# Patient Record
Sex: Female | Born: 1935 | Race: White | Hispanic: No | Smoking: Former smoker
Health system: Southern US, Community
[De-identification: ages and names within clinical notes are randomized; demographics above are authoritative.]

## PROBLEM LIST (undated history)

## (undated) DIAGNOSIS — D649 Anemia, unspecified: Secondary | ICD-10-CM

## (undated) DIAGNOSIS — E039 Hypothyroidism, unspecified: Secondary | ICD-10-CM

## (undated) DIAGNOSIS — T4145XA Adverse effect of unspecified anesthetic, initial encounter: Secondary | ICD-10-CM

## (undated) DIAGNOSIS — K219 Gastro-esophageal reflux disease without esophagitis: Secondary | ICD-10-CM

## (undated) DIAGNOSIS — C3490 Malignant neoplasm of unspecified part of unspecified bronchus or lung: Secondary | ICD-10-CM

## (undated) DIAGNOSIS — I10 Essential (primary) hypertension: Secondary | ICD-10-CM

## (undated) DIAGNOSIS — C3491 Malignant neoplasm of unspecified part of right bronchus or lung: Secondary | ICD-10-CM

## (undated) DIAGNOSIS — I482 Chronic atrial fibrillation, unspecified: Secondary | ICD-10-CM

## (undated) DIAGNOSIS — M109 Gout, unspecified: Secondary | ICD-10-CM

## (undated) DIAGNOSIS — E7800 Pure hypercholesterolemia, unspecified: Secondary | ICD-10-CM

## (undated) DIAGNOSIS — J189 Pneumonia, unspecified organism: Secondary | ICD-10-CM

## (undated) DIAGNOSIS — Z9289 Personal history of other medical treatment: Secondary | ICD-10-CM

## (undated) DIAGNOSIS — C50919 Malignant neoplasm of unspecified site of unspecified female breast: Secondary | ICD-10-CM

## (undated) DIAGNOSIS — K319 Disease of stomach and duodenum, unspecified: Secondary | ICD-10-CM

## (undated) DIAGNOSIS — G459 Transient cerebral ischemic attack, unspecified: Secondary | ICD-10-CM

## (undated) DIAGNOSIS — S301XXA Contusion of abdominal wall, initial encounter: Secondary | ICD-10-CM

## (undated) DIAGNOSIS — Z9981 Dependence on supplemental oxygen: Secondary | ICD-10-CM

## (undated) DIAGNOSIS — R011 Cardiac murmur, unspecified: Secondary | ICD-10-CM

## (undated) DIAGNOSIS — I509 Heart failure, unspecified: Secondary | ICD-10-CM

## (undated) DIAGNOSIS — T8859XA Other complications of anesthesia, initial encounter: Secondary | ICD-10-CM

## (undated) DIAGNOSIS — M1990 Unspecified osteoarthritis, unspecified site: Secondary | ICD-10-CM

## (undated) DIAGNOSIS — Z853 Personal history of malignant neoplasm of breast: Secondary | ICD-10-CM

## (undated) DIAGNOSIS — I4821 Permanent atrial fibrillation: Secondary | ICD-10-CM

## (undated) DIAGNOSIS — K922 Gastrointestinal hemorrhage, unspecified: Secondary | ICD-10-CM

## (undated) HISTORY — PX: CHOLECYSTECTOMY: SHX55

## (undated) HISTORY — PX: CATARACT EXTRACTION W/ INTRAOCULAR LENS  IMPLANT, BILATERAL: SHX1307

## (undated) HISTORY — DX: Personal history of other medical treatment: Z92.89

## (undated) HISTORY — DX: Disease of stomach and duodenum, unspecified: K31.9

## (undated) HISTORY — PX: CARDIOVERSION: SHX1299

## (undated) HISTORY — PX: APPENDECTOMY: SHX54

## (undated) HISTORY — DX: Hypothyroidism, unspecified: E03.9

## (undated) HISTORY — DX: Essential (primary) hypertension: I10

## (undated) HISTORY — PX: CARDIAC CATHETERIZATION: SHX172

## (undated) HISTORY — PX: ABDOMINAL HYSTERECTOMY: SHX81

## (undated) HISTORY — PX: BREAST BIOPSY: SHX20

## (undated) HISTORY — DX: Malignant neoplasm of unspecified part of right bronchus or lung: C34.91

## (undated) HISTORY — PX: BREAST LUMPECTOMY: SHX2

## (undated) HISTORY — DX: Personal history of malignant neoplasm of breast: Z85.3

## (undated) HISTORY — DX: Permanent atrial fibrillation: I48.21

---

## 1994-04-01 HISTORY — PX: AORTIC VALVE REPLACEMENT: SHX41

## 1997-08-17 ENCOUNTER — Other Ambulatory Visit: Admission: RE | Admit: 1997-08-17 | Discharge: 1997-08-17 | Payer: Self-pay | Admitting: Oncology

## 1997-11-13 ENCOUNTER — Emergency Department (HOSPITAL_COMMUNITY): Admission: EM | Admit: 1997-11-13 | Discharge: 1997-11-13 | Payer: Self-pay | Admitting: Emergency Medicine

## 1998-04-10 ENCOUNTER — Other Ambulatory Visit: Admission: RE | Admit: 1998-04-10 | Discharge: 1998-04-10 | Payer: Self-pay | Admitting: Obstetrics and Gynecology

## 1999-03-25 ENCOUNTER — Emergency Department (HOSPITAL_COMMUNITY): Admission: EM | Admit: 1999-03-25 | Discharge: 1999-03-25 | Payer: Self-pay | Admitting: Emergency Medicine

## 1999-03-25 ENCOUNTER — Encounter: Payer: Self-pay | Admitting: Emergency Medicine

## 1999-05-03 ENCOUNTER — Other Ambulatory Visit: Admission: RE | Admit: 1999-05-03 | Discharge: 1999-05-03 | Payer: Self-pay | Admitting: Obstetrics and Gynecology

## 1999-09-03 ENCOUNTER — Ambulatory Visit (HOSPITAL_COMMUNITY): Admission: RE | Admit: 1999-09-03 | Discharge: 1999-09-03 | Payer: Self-pay | Admitting: Gastroenterology

## 2000-05-28 ENCOUNTER — Other Ambulatory Visit: Admission: RE | Admit: 2000-05-28 | Discharge: 2000-05-28 | Payer: Self-pay | Admitting: Obstetrics and Gynecology

## 2002-07-20 ENCOUNTER — Other Ambulatory Visit: Admission: RE | Admit: 2002-07-20 | Discharge: 2002-07-20 | Payer: Self-pay | Admitting: Obstetrics and Gynecology

## 2002-08-11 ENCOUNTER — Ambulatory Visit (HOSPITAL_COMMUNITY): Admission: RE | Admit: 2002-08-11 | Discharge: 2002-08-11 | Payer: Self-pay | Admitting: Cardiology

## 2002-08-23 ENCOUNTER — Ambulatory Visit (HOSPITAL_COMMUNITY): Admission: RE | Admit: 2002-08-23 | Discharge: 2002-08-23 | Payer: Self-pay | Admitting: Cardiology

## 2003-09-22 ENCOUNTER — Ambulatory Visit (HOSPITAL_COMMUNITY): Admission: RE | Admit: 2003-09-22 | Discharge: 2003-09-22 | Payer: Self-pay | Admitting: *Deleted

## 2003-09-22 ENCOUNTER — Encounter (INDEPENDENT_AMBULATORY_CARE_PROVIDER_SITE_OTHER): Payer: Self-pay | Admitting: Specialist

## 2004-02-21 ENCOUNTER — Encounter: Admission: RE | Admit: 2004-02-21 | Discharge: 2004-03-13 | Payer: Self-pay | Admitting: Cardiology

## 2006-01-23 ENCOUNTER — Other Ambulatory Visit: Admission: RE | Admit: 2006-01-23 | Discharge: 2006-01-23 | Payer: Self-pay | Admitting: Obstetrics and Gynecology

## 2006-08-05 ENCOUNTER — Encounter: Admission: RE | Admit: 2006-08-05 | Discharge: 2006-08-05 | Payer: Self-pay | Admitting: Cardiology

## 2008-02-03 ENCOUNTER — Ambulatory Visit: Payer: Self-pay | Admitting: Obstetrics and Gynecology

## 2008-02-03 ENCOUNTER — Other Ambulatory Visit: Admission: RE | Admit: 2008-02-03 | Discharge: 2008-02-03 | Payer: Self-pay | Admitting: Obstetrics and Gynecology

## 2008-02-03 ENCOUNTER — Encounter: Payer: Self-pay | Admitting: Obstetrics and Gynecology

## 2008-05-24 ENCOUNTER — Encounter: Admission: RE | Admit: 2008-05-24 | Discharge: 2008-05-24 | Payer: Self-pay | Admitting: Gastroenterology

## 2008-07-21 ENCOUNTER — Ambulatory Visit: Payer: Self-pay | Admitting: Obstetrics and Gynecology

## 2008-09-08 ENCOUNTER — Ambulatory Visit: Payer: Self-pay | Admitting: Obstetrics and Gynecology

## 2008-10-04 ENCOUNTER — Ambulatory Visit (HOSPITAL_COMMUNITY): Admission: RE | Admit: 2008-10-04 | Discharge: 2008-10-04 | Payer: Self-pay | Admitting: Obstetrics and Gynecology

## 2009-05-11 ENCOUNTER — Emergency Department (HOSPITAL_COMMUNITY): Admission: EM | Admit: 2009-05-11 | Discharge: 2009-05-12 | Payer: Self-pay | Admitting: Emergency Medicine

## 2009-11-16 ENCOUNTER — Ambulatory Visit: Payer: Self-pay | Admitting: Cardiology

## 2009-11-30 ENCOUNTER — Ambulatory Visit: Payer: Self-pay | Admitting: Cardiology

## 2009-12-18 ENCOUNTER — Ambulatory Visit: Payer: Self-pay | Admitting: Cardiovascular Disease

## 2009-12-26 ENCOUNTER — Ambulatory Visit: Payer: Self-pay | Admitting: Cardiology

## 2009-12-29 ENCOUNTER — Encounter: Admission: RE | Admit: 2009-12-29 | Discharge: 2009-12-29 | Payer: Self-pay | Admitting: Gastroenterology

## 2010-01-04 ENCOUNTER — Ambulatory Visit: Payer: Self-pay | Admitting: Cardiology

## 2010-01-18 ENCOUNTER — Ambulatory Visit: Payer: Self-pay | Admitting: Cardiology

## 2010-01-25 ENCOUNTER — Ambulatory Visit: Payer: Self-pay | Admitting: Cardiology

## 2010-01-30 ENCOUNTER — Ambulatory Visit: Payer: Self-pay | Admitting: Cardiology

## 2010-02-06 ENCOUNTER — Ambulatory Visit: Payer: Self-pay | Admitting: Cardiovascular Disease

## 2010-02-08 ENCOUNTER — Emergency Department (HOSPITAL_COMMUNITY): Admission: EM | Admit: 2010-02-08 | Discharge: 2010-02-09 | Payer: Self-pay | Admitting: Emergency Medicine

## 2010-02-10 ENCOUNTER — Emergency Department (HOSPITAL_COMMUNITY): Admission: EM | Admit: 2010-02-10 | Discharge: 2010-02-11 | Payer: Self-pay | Admitting: Emergency Medicine

## 2010-02-13 ENCOUNTER — Ambulatory Visit: Payer: Self-pay | Admitting: Cardiology

## 2010-02-15 ENCOUNTER — Ambulatory Visit (HOSPITAL_COMMUNITY): Admission: RE | Admit: 2010-02-15 | Discharge: 2010-02-15 | Payer: Self-pay | Admitting: Gastroenterology

## 2010-02-20 ENCOUNTER — Ambulatory Visit: Payer: Self-pay | Admitting: Cardiology

## 2010-02-27 ENCOUNTER — Ambulatory Visit: Payer: Self-pay | Admitting: Cardiology

## 2010-03-13 ENCOUNTER — Encounter: Admission: RE | Admit: 2010-03-13 | Payer: Self-pay | Source: Home / Self Care | Admitting: Cardiology

## 2010-03-13 ENCOUNTER — Encounter
Admission: RE | Admit: 2010-03-13 | Discharge: 2010-03-13 | Payer: Self-pay | Source: Home / Self Care | Attending: Cardiology | Admitting: Cardiology

## 2010-03-15 ENCOUNTER — Ambulatory Visit: Payer: Self-pay | Admitting: Cardiology

## 2010-03-15 ENCOUNTER — Ambulatory Visit: Payer: Self-pay | Admitting: Vascular Surgery

## 2010-03-27 ENCOUNTER — Ambulatory Visit: Payer: Self-pay | Admitting: Cardiology

## 2010-04-01 HISTORY — PX: OTHER SURGICAL HISTORY: SHX169

## 2010-04-06 ENCOUNTER — Ambulatory Visit (HOSPITAL_COMMUNITY)
Admission: RE | Admit: 2010-04-06 | Discharge: 2010-04-06 | Payer: Self-pay | Source: Home / Self Care | Attending: Vascular Surgery | Admitting: Vascular Surgery

## 2010-04-06 LAB — POCT I-STAT, CHEM 8
BUN: 28 mg/dL — ABNORMAL HIGH (ref 6–23)
Calcium, Ion: 1.1 mmol/L — ABNORMAL LOW (ref 1.12–1.32)
Chloride: 100 mEq/L (ref 96–112)
Creatinine, Ser: 0.9 mg/dL (ref 0.4–1.2)
Glucose, Bld: 105 mg/dL — ABNORMAL HIGH (ref 70–99)
HCT: 43 % (ref 36.0–46.0)
Hemoglobin: 14.6 g/dL (ref 12.0–15.0)
Potassium: 4 mEq/L (ref 3.5–5.1)
Sodium: 137 mEq/L (ref 135–145)
TCO2: 32 mmol/L (ref 0–100)

## 2010-04-06 LAB — PROTIME-INR
INR: 1.16 (ref 0.00–1.49)
Prothrombin Time: 15 seconds (ref 11.6–15.2)

## 2010-04-06 LAB — APTT: aPTT: 49 seconds — ABNORMAL HIGH (ref 24–37)

## 2010-04-11 ENCOUNTER — Ambulatory Visit: Payer: Self-pay | Admitting: Cardiology

## 2010-04-18 ENCOUNTER — Ambulatory Visit: Payer: Self-pay | Admitting: Cardiology

## 2010-04-20 ENCOUNTER — Encounter: Payer: Self-pay | Admitting: Cardiology

## 2010-04-20 DIAGNOSIS — I119 Hypertensive heart disease without heart failure: Secondary | ICD-10-CM | POA: Insufficient documentation

## 2010-04-20 DIAGNOSIS — E039 Hypothyroidism, unspecified: Secondary | ICD-10-CM | POA: Insufficient documentation

## 2010-04-20 DIAGNOSIS — I499 Cardiac arrhythmia, unspecified: Secondary | ICD-10-CM | POA: Insufficient documentation

## 2010-04-25 ENCOUNTER — Ambulatory Visit: Payer: Self-pay | Admitting: Cardiology

## 2010-05-01 ENCOUNTER — Ambulatory Visit: Payer: Self-pay | Admitting: Cardiology

## 2010-05-15 ENCOUNTER — Encounter (INDEPENDENT_AMBULATORY_CARE_PROVIDER_SITE_OTHER): Payer: Medicare Other

## 2010-05-15 DIAGNOSIS — I4891 Unspecified atrial fibrillation: Secondary | ICD-10-CM

## 2010-05-15 DIAGNOSIS — Z7901 Long term (current) use of anticoagulants: Secondary | ICD-10-CM

## 2010-05-29 ENCOUNTER — Encounter (INDEPENDENT_AMBULATORY_CARE_PROVIDER_SITE_OTHER): Payer: Medicare Other

## 2010-05-29 DIAGNOSIS — Z7901 Long term (current) use of anticoagulants: Secondary | ICD-10-CM

## 2010-05-29 DIAGNOSIS — I4891 Unspecified atrial fibrillation: Secondary | ICD-10-CM

## 2010-06-04 ENCOUNTER — Inpatient Hospital Stay (HOSPITAL_COMMUNITY)
Admission: AD | Admit: 2010-06-04 | Discharge: 2010-06-08 | DRG: 305 | Disposition: A | Discharge status: Home / Self Care | Payer: Medicare Other | Source: Ambulatory Visit | Attending: Cardiology | Admitting: Cardiology

## 2010-06-04 ENCOUNTER — Inpatient Hospital Stay (HOSPITAL_COMMUNITY): Payer: Medicare Other

## 2010-06-04 DIAGNOSIS — Z87891 Personal history of nicotine dependence: Secondary | ICD-10-CM

## 2010-06-04 DIAGNOSIS — R0789 Other chest pain: Secondary | ICD-10-CM | POA: Diagnosis present

## 2010-06-04 DIAGNOSIS — E871 Hypo-osmolality and hyponatremia: Secondary | ICD-10-CM | POA: Diagnosis present

## 2010-06-04 DIAGNOSIS — I10 Essential (primary) hypertension: Secondary | ICD-10-CM

## 2010-06-04 DIAGNOSIS — Z954 Presence of other heart-valve replacement: Secondary | ICD-10-CM

## 2010-06-04 DIAGNOSIS — I472 Ventricular tachycardia, unspecified: Secondary | ICD-10-CM | POA: Diagnosis not present

## 2010-06-04 DIAGNOSIS — I4729 Other ventricular tachycardia: Secondary | ICD-10-CM | POA: Diagnosis not present

## 2010-06-04 DIAGNOSIS — I4891 Unspecified atrial fibrillation: Secondary | ICD-10-CM | POA: Diagnosis present

## 2010-06-04 DIAGNOSIS — R42 Dizziness and giddiness: Secondary | ICD-10-CM

## 2010-06-04 DIAGNOSIS — Z79899 Other long term (current) drug therapy: Secondary | ICD-10-CM

## 2010-06-04 DIAGNOSIS — E039 Hypothyroidism, unspecified: Secondary | ICD-10-CM | POA: Diagnosis present

## 2010-06-04 DIAGNOSIS — Z7901 Long term (current) use of anticoagulants: Secondary | ICD-10-CM

## 2010-06-04 DIAGNOSIS — M109 Gout, unspecified: Secondary | ICD-10-CM | POA: Diagnosis present

## 2010-06-04 LAB — NA AND K (SODIUM & POTASSIUM), RAND UR
Potassium Urine: 24 mEq/L
Sodium, Ur: 40 mEq/L

## 2010-06-04 LAB — T4, FREE: Free T4: 1.71 ng/dL (ref 0.80–1.80)

## 2010-06-04 LAB — URINALYSIS, ROUTINE W REFLEX MICROSCOPIC
Glucose, UA: NEGATIVE mg/dL
Ketones, ur: NEGATIVE mg/dL
Nitrite: NEGATIVE
Specific Gravity, Urine: 1.009 (ref 1.005–1.030)
pH: 6.5 (ref 5.0–8.0)

## 2010-06-04 LAB — BRAIN NATRIURETIC PEPTIDE: Pro B Natriuretic peptide (BNP): 188 pg/mL — ABNORMAL HIGH (ref 0.0–100.0)

## 2010-06-04 LAB — CBC
Hemoglobin: 14.8 g/dL (ref 12.0–15.0)
MCH: 31.5 pg (ref 26.0–34.0)
MCHC: 33.9 g/dL (ref 30.0–36.0)
MCV: 93 fL (ref 78.0–100.0)
Platelets: 198 10*3/uL (ref 150–400)
RBC: 4.7 MIL/uL (ref 3.87–5.11)

## 2010-06-04 LAB — COMPREHENSIVE METABOLIC PANEL
Albumin: 3.9 g/dL (ref 3.5–5.2)
Alkaline Phosphatase: 70 U/L (ref 39–117)
BUN: 20 mg/dL (ref 6–23)
Chloride: 91 mEq/L — ABNORMAL LOW (ref 96–112)
Creatinine, Ser: 0.94 mg/dL (ref 0.4–1.2)
GFR calc non Af Amer: 57 mL/min — ABNORMAL LOW (ref >60)
Glucose, Bld: 116 mg/dL — ABNORMAL HIGH (ref 70–99)
Potassium: 4.5 mEq/L (ref 3.5–5.1)
Total Bilirubin: 0.6 mg/dL (ref 0.3–1.2)

## 2010-06-04 LAB — TSH: TSH: 1.516 u[IU]/mL (ref 0.350–4.500)

## 2010-06-04 LAB — CARDIAC PANEL(CRET KIN+CKTOT+MB+TROPI)
Relative Index: INVALID (ref 0.0–2.5)
Total CK: 58 U/L (ref 7–177)
Troponin I: 0.03 ng/mL (ref 0.00–0.06)

## 2010-06-04 LAB — APTT: aPTT: 57 seconds — ABNORMAL HIGH (ref 24–37)

## 2010-06-05 DIAGNOSIS — I119 Hypertensive heart disease without heart failure: Secondary | ICD-10-CM

## 2010-06-05 LAB — BASIC METABOLIC PANEL
CO2: 31 mEq/L (ref 19–32)
Chloride: 93 mEq/L — ABNORMAL LOW (ref 96–112)
Creatinine, Ser: 0.71 mg/dL (ref 0.4–1.2)
GFR calc Af Amer: >60 mL/min (ref >60)
Glucose, Bld: 111 mg/dL — ABNORMAL HIGH (ref 70–99)

## 2010-06-05 LAB — URINE CULTURE: Culture  Setup Time: 201203051713

## 2010-06-05 LAB — LIPID PANEL
LDL Cholesterol: 103 mg/dL — ABNORMAL HIGH (ref 0–99)
Total CHOL/HDL Ratio: 3.7 RATIO
VLDL: 39 mg/dL (ref 0–40)

## 2010-06-06 DIAGNOSIS — R072 Precordial pain: Secondary | ICD-10-CM

## 2010-06-06 LAB — BRAIN NATRIURETIC PEPTIDE: Pro B Natriuretic peptide (BNP): 198 pg/mL — ABNORMAL HIGH (ref 0.0–100.0)

## 2010-06-06 LAB — BASIC METABOLIC PANEL
CO2: 30 mEq/L (ref 19–32)
Chloride: 97 mEq/L (ref 96–112)
Creatinine, Ser: 0.69 mg/dL (ref 0.4–1.2)
GFR calc Af Amer: >60 mL/min (ref >60)
Glucose, Bld: 104 mg/dL — ABNORMAL HIGH (ref 70–99)
Sodium: 134 mEq/L — ABNORMAL LOW (ref 135–145)

## 2010-06-06 LAB — PROTIME-INR: INR: 2.34 — ABNORMAL HIGH (ref 0.00–1.49)

## 2010-06-07 ENCOUNTER — Inpatient Hospital Stay (HOSPITAL_COMMUNITY): Payer: Medicare Other

## 2010-06-07 DIAGNOSIS — R079 Chest pain, unspecified: Secondary | ICD-10-CM

## 2010-06-07 LAB — BASIC METABOLIC PANEL
Calcium: 9.8 mg/dL (ref 8.4–10.5)
Creatinine, Ser: 0.73 mg/dL (ref 0.4–1.2)
GFR calc Af Amer: >60 mL/min (ref >60)
Sodium: 135 mEq/L (ref 135–145)

## 2010-06-07 LAB — PROTIME-INR: INR: 3.04 — ABNORMAL HIGH (ref 0.00–1.49)

## 2010-06-07 MED ORDER — TECHNETIUM TC 99M TETROFOSMIN IV KIT
30.0000 | PACK | Freq: Once | INTRAVENOUS | Status: AC | PRN
  Administered 2010-06-07: 30 via INTRAVENOUS

## 2010-06-07 MED ORDER — TECHNETIUM TC 99M TETROFOSMIN IV KIT
10.0000 | PACK | Freq: Once | INTRAVENOUS | Status: AC | PRN
  Administered 2010-06-07: 10 via INTRAVENOUS

## 2010-06-08 ENCOUNTER — Inpatient Hospital Stay (HOSPITAL_COMMUNITY): Payer: Medicare Other

## 2010-06-08 LAB — BASIC METABOLIC PANEL
BUN: 21 mg/dL (ref 6–23)
Calcium: 9.6 mg/dL (ref 8.4–10.5)
Creatinine, Ser: 0.74 mg/dL (ref 0.4–1.2)
GFR calc non Af Amer: >60 mL/min (ref >60)

## 2010-06-08 LAB — PROTIME-INR
INR: 3.05 — ABNORMAL HIGH (ref 0.00–1.49)
Prothrombin Time: 31.6 seconds — ABNORMAL HIGH (ref 11.6–15.2)

## 2010-06-08 NOTE — H&P (Signed)
NAMEJAVONA, Deborah [name]               ACCOUNT NO.:  [id number]  MEDICAL RECORD NO.:  [id number]           PATIENT TYPE:  I  LOCATION:  3701                         FACILITY:  [facility]  PHYSICIAN:  Cassell Clement, M.D. DATE OF BIRTH:  [birth year]  DATE OF ADMISSION:  06/04/2010 DATE OF DISCHARGE:                             HISTORY & PHYSICAL   CARDIOLOGIST AND PRIMARY CARE PROVIDER:  Cassell Clement, MD  GASTROENTEROLOGIST:  Petra Kuba, MD  CHIEF COMPLAINT:  Uncontrolled hypertension, dizziness, chest pressure.  HISTORY:  This is an 77 year old Caucasian female with chronic atrial fibrillation who is status post aortic valve replacement with a St. Jude valve.  Her aortic valve replacement was in 1996.  She is in chronic atrial fibrillation on Coumadin.  Recently, she has been having a problem with severe fluctuations of high blood pressure.  She has been seen numerous times in the office over the past several months with adjustment of medication without much improvement.  Over the weekend, she continued to have spells of very high blood pressure associated with pulses in the 40s associated with weakness and chest pressure and dizziness.  She has not had any nausea and vomiting.  She has not been doing any recent exercise because she has felt so bad.  She has had unexplained abdominal cramping which feel to her like labor pains.  It is associated with a feeling of chills but no definite fever.  She has had a thorough workup by Dr. Ewing Schlein without any specific findings.  Of note is the fact that, she had a renal arteriogram on April 06, 2010 by Dr. Darrick Penna after a renal Doppler had suggested possible renal artery stenosis as a cause of her high blood pressure.  However, the operative findings at the time of the renal arteriogram showed widely patent left and right renal arteries as well as widely patent superior mesenteric, inferior mesenteric, and celiac arteries.  Thus, there is no  good explanation for her abdominal cramping on an ischemic basis.  The patient has been on a multidrug regimen at home which will be enumerated subsequently.  ALLERGIES:  The patient is allergic to FENTANYL, VERSED, and LIDOCAINE as well as allergic to MECLIZINE, MACRODANTIN, FOSAMAX, AMIODARONE, and CIPRO.  FAMILY HISTORY:  Her father died at age 31 of atherosclerosis.  Her mother died at 98 of Alzheimer's.  SOCIAL HISTORY:  She is a widow.  Her husband died of a cerebral hemorrhage several years ago and had been on Coumadin.  The patient is retired.  She lives at [place].  She quit smoking 35 years ago.  She has had no alcohol for the past 2 weeks because she has felt so bad. Prior to that, she was in the custom of having a glass of bourbon in the evening and often one glass of wine at dinner.  PAST MEDICAL HISTORY:  Hysterectomy, cholecystectomy, breast cancer in 1994, and aortic valve replacement for aortic stenosis in 1996. Additional history includes history of gout, history of osteoarthritis, and history of elevated cholesterol.  MEDICATIONS ON ADMISSION: 1. Ambien CR 12.5 mg at bedtime. 2. Artificial tears daily  p.r.n. 3. Tylenol Extra Strength 1-2 tablets every 4 hours p.r.n. 4. Dicyclomine 10 mg twice daily p.r.n. 5. Simvastatin 5 mg each night. 6. Tribenzor 40/5/25 one each morning. 7. Metoprolol succinate 100 mg twice daily. 8. Synthroid 88 mcg daily. 9. Prevacid 30 mg daily. 10.Potassium 20 mEq daily. 11.Os-Cal with vitamin D 500 mg twice daily. 12.Furosemide 40 mg daily. 13.Lanoxin 0.25 mg 1/2 tablet daily. 14.Fish oil 1000 mg twice a day. 15.Coumadin 5 mg 3 days a week and 2.5 mg 4 days a week. 16.Centrum Silver for women once a day. 17.Celebrex 200 mg daily. 18.Allopurinol 300 mg daily.  REVIEW OF SYSTEMS:  RESPIRATORY:  No cough or sputum production. GENITOURINARY:  No dysuria.  GI:  Cramping as noted.  She does have arthritis of her hands and hips  which have been improved with Celebrex. Bowel movements have been loose but not diarrhea.  She has had no nausea or vomiting.  She states, however, that her stomach never feels well.  All other systems are negative in detail.  PHYSICAL EXAMINATION:  VITAL SIGNS:  Blood pressure on arrival here is slightly elevated at 156/80, pulse is 64 and irregularly irregular, respirations are normal.  She is afebrile. GENERAL APPEARANCE:  A well-developed, well-nourished woman in no distress. SKIN:  Clear. HEENT:  Pupils equal and reactive.  Sclerae nonicteric.  Mouth and pharynx normal. NECK:  Carotids normal upstroke.  No bruits.  Jugular venous pressure normal.  Thyroid normal. CHEST:  Clear to auscultation. BREASTS:  Not examined. HEART:  Good opening and closing aortic valve clicks.  No gallop or rub. No aortic insufficiency. ABDOMEN:  Soft and nontender. EXTREMITIES:  Good peripheral pulses.  No edema.  No phlebitis. NEUROLOGIC:  Physiologic.  LABORATORY DATA:  Blood work is pending.  EKG and chest x-ray are pending.  IMPRESSION: 1. Uncontrolled hypertension with previous negative workup including     negative metanephrines, negative aldosterone level, and normal     renal arteriogram 2. Status post aortic valve replacement for aortic stenosis with a St.     Jude Mechanical valve. 3. Chronic atrial fibrillation. 4. History of gout with no recent exacerbation. 5. Dizzy spells, rule out arrhythmia, rule out secondary to blood     pressure fluctuation 6. Osteoarthritis. 7. Hypothyroidism. 8. Hyponatremia with serum sodium of 128 noted on recent outpatient     labs. 9. Chest pressure, possibly secondary to blood pressure fluctuation     versus anxiety, gouty angina pectoris but we will get one set of     cardiac enzymes.  DISPOSITION:  Admit to telemetry.  Check cardiac enzymes.  Change Tribenzor to 40/10/12.5.  Discontinue clonidine.  Further workup as directed by initial  labs.          ______________________________ Cassell Clement, M.D.     TB/MEDQ  D:  06/04/2010  T:  06/05/2010  Job:  [id number]  Electronically Signed by Cassell Clement M.D. on 06/07/2010 04:19:33 PM

## 2010-06-12 ENCOUNTER — Ambulatory Visit (INDEPENDENT_AMBULATORY_CARE_PROVIDER_SITE_OTHER): Payer: Medicare Other | Admitting: Nurse Practitioner

## 2010-06-12 DIAGNOSIS — E871 Hypo-osmolality and hyponatremia: Secondary | ICD-10-CM

## 2010-06-12 DIAGNOSIS — Z7901 Long term (current) use of anticoagulants: Secondary | ICD-10-CM

## 2010-06-12 DIAGNOSIS — I10 Essential (primary) hypertension: Secondary | ICD-10-CM

## 2010-06-12 DIAGNOSIS — R079 Chest pain, unspecified: Secondary | ICD-10-CM

## 2010-06-12 LAB — TYPE AND SCREEN: Antibody Screen: NEGATIVE

## 2010-06-13 LAB — URINALYSIS, ROUTINE W REFLEX MICROSCOPIC
Bilirubin Urine: NEGATIVE
Ketones, ur: NEGATIVE mg/dL
Nitrite: NEGATIVE
Protein, ur: NEGATIVE mg/dL
Specific Gravity, Urine: 1.005 (ref 1.005–1.030)
Urobilinogen, UA: 0.2 mg/dL (ref 0.0–1.0)

## 2010-06-13 LAB — PROTIME-INR
INR: 1.6 — ABNORMAL HIGH (ref 0.00–1.49)
Prothrombin Time: 19.2 seconds — ABNORMAL HIGH (ref 11.6–15.2)

## 2010-06-13 LAB — POCT CARDIAC MARKERS
CKMB, poc: <1 ng/mL — ABNORMAL LOW (ref 1.0–8.0)
Myoglobin, poc: 35 ng/mL (ref 12–200)
Myoglobin, poc: 40.6 ng/mL (ref 12–200)
Troponin i, poc: <0.05 ng/mL (ref 0.00–0.09)
Troponin i, poc: <0.05 ng/mL (ref 0.00–0.09)

## 2010-06-13 LAB — CBC
Hemoglobin: 12.6 g/dL (ref 12.0–15.0)
MCHC: 31.7 g/dL (ref 30.0–36.0)
Platelets: 178 10*3/uL (ref 150–400)
Platelets: 215 10*3/uL (ref 150–400)
RBC: 4.36 MIL/uL (ref 3.87–5.11)
RBC: 4.87 MIL/uL (ref 3.87–5.11)
RDW: 15.1 % (ref 11.5–15.5)
WBC: 7 10*3/uL (ref 4.0–10.5)

## 2010-06-13 LAB — BASIC METABOLIC PANEL
Calcium: 9.5 mg/dL (ref 8.4–10.5)
GFR calc Af Amer: >60 mL/min (ref >60)
GFR calc non Af Amer: >60 mL/min (ref >60)
Sodium: 132 mEq/L — ABNORMAL LOW (ref 135–145)

## 2010-06-13 LAB — COMPREHENSIVE METABOLIC PANEL
AST: 32 U/L (ref 0–37)
Albumin: 4.1 g/dL (ref 3.5–5.2)
Alkaline Phosphatase: 84 U/L (ref 39–117)
Chloride: 92 mEq/L — ABNORMAL LOW (ref 96–112)
GFR calc Af Amer: >60 mL/min (ref >60)
Potassium: 3.4 mEq/L — ABNORMAL LOW (ref 3.5–5.1)
Sodium: 132 mEq/L — ABNORMAL LOW (ref 135–145)
Total Bilirubin: 0.5 mg/dL (ref 0.3–1.2)
Total Protein: 7.1 g/dL (ref 6.0–8.3)

## 2010-06-13 LAB — DIFFERENTIAL
Basophils Absolute: 0 10*3/uL (ref 0.0–0.1)
Basophils Relative: 0 % (ref 0–1)
Basophils Relative: 0 % (ref 0–1)
Eosinophils Relative: 2 % (ref 0–5)
Lymphs Abs: 1 10*3/uL (ref 0.7–4.0)
Monocytes Absolute: 0.5 10*3/uL (ref 0.1–1.0)
Monocytes Relative: 7 % (ref 3–12)
Monocytes Relative: 7 % (ref 3–12)
Neutro Abs: 5.1 10*3/uL (ref 1.7–7.7)
Neutro Abs: 5.9 10*3/uL (ref 1.7–7.7)
Neutrophils Relative %: 79 % — ABNORMAL HIGH (ref 43–77)

## 2010-06-13 LAB — URINE MICROSCOPIC-ADD ON

## 2010-06-13 LAB — APTT: aPTT: 70 seconds — ABNORMAL HIGH (ref 24–37)

## 2010-06-19 ENCOUNTER — Ambulatory Visit: Payer: Self-pay | Admitting: Cardiology

## 2010-06-20 LAB — BASIC METABOLIC PANEL
CO2: 27 mEq/L (ref 19–32)
Calcium: 9.6 mg/dL (ref 8.4–10.5)
Chloride: 96 mEq/L (ref 96–112)
Creatinine, Ser: 0.63 mg/dL (ref 0.4–1.2)
GFR calc Af Amer: >60 mL/min (ref >60)
Sodium: 133 mEq/L — ABNORMAL LOW (ref 135–145)

## 2010-06-20 LAB — CBC
Hemoglobin: 14.3 g/dL (ref 12.0–15.0)
RBC: 4.77 MIL/uL (ref 3.87–5.11)
WBC: 6.5 10*3/uL (ref 4.0–10.5)

## 2010-06-20 LAB — DIFFERENTIAL
Lymphs Abs: 1.5 10*3/uL (ref 0.7–4.0)
Monocytes Absolute: 0.5 10*3/uL (ref 0.1–1.0)
Monocytes Relative: 7 % (ref 3–12)
Neutro Abs: 4.5 10*3/uL (ref 1.7–7.7)
Neutrophils Relative %: 69 % (ref 43–77)

## 2010-06-20 LAB — POCT CARDIAC MARKERS
CKMB, poc: 1 ng/mL (ref 1.0–8.0)
CKMB, poc: 2 ng/mL (ref 1.0–8.0)
Myoglobin, poc: 378 ng/mL (ref 12–200)
Myoglobin, poc: 49.4 ng/mL (ref 12–200)
Troponin i, poc: <0.05 ng/mL (ref 0.00–0.09)
Troponin i, poc: <0.05 ng/mL (ref 0.00–0.09)

## 2010-06-20 LAB — PROTIME-INR: INR: 1.84 — ABNORMAL HIGH (ref 0.00–1.49)

## 2010-06-25 NOTE — Discharge Summary (Signed)
NAMEANDREW, SORIA               ACCOUNT NO.:  [id number]  MEDICAL RECORD NO.:  [id number]           PATIENT TYPE:  I  LOCATION:  3701                         FACILITY:  [facility]  PHYSICIAN:  Cassell Clement, M.D. DATE OF BIRTH:  Dec 03, 1926  DATE OF ADMISSION:  06/04/2010 DATE OF DISCHARGE:  06/08/2010                              DISCHARGE SUMMARY   PROCEDURES: 1. Lexiscan Myoview. 2. Two-view chest x-ray. 3. 2-D echocardiogram.  PRIMARY FINAL DISCHARGE DIAGNOSIS:  Chest pain, cardiac enzymes negative for myocardial infarction and Myoview showing no ischemia, ejection fraction 68%.  SECONDARY DIAGNOSES: 1. Status post St. Jude aortic valve replacement in 1996 with     echocardiogram this admission showing an ejection fraction of 60%     and trivial AI with a mean gradient of 26 mmHg by echo. 2. Uncontrolled hypertension, medication changes made this admission. 3. Permanent atrial fibrillation. 4. Chronic anticoagulation with Coumadin. 5. Gout. 6. Dizziness. 7. Hypothyroidism with a TSH of 1.516 and free T4 of 1.71 this     admission. 8. Hyponatremia by history with a sodium of 135 at discharge. 9. Family history of atherosclerosis in her father. 10. History of breast cancer with surgery. 11. Status post hysterectomy and cholecystectomy. 12. Hyperlipidemia.  ALLERGIES:  Intolerance to FENTANYL, VERSED, LIDOCAINE, MECLIZINE, MACRODANTIN, FOSAMAX, AMIODARONE, and CIPRO.  TIME AT DISCHARGE:  38 minutes.  HOSPITAL COURSE:  Deborah [name] is an 77 year old female with a history of aortic valve replacement, but not coronary artery disease.  She came to the hospital with dizziness and chest pressure.  Her initial blood pressure was elevated above normal for her at 202/89.  She was admitted for further evaluation and treatment.  A BNP was mildly elevated at 198, but her chest x-ray only showed cardiomegaly with mild vascular prominence.  Her cardiac enzymes were negative for  MI.  Her lipid profile showed total cholesterol 194, triglycerides 194, HDL 52, LDL 103.  Her INR was therapeutic and Coumadin was continued such that it remained so with an INR of 3.05 at discharge.  She was not anemic and had no signs or symptoms of infection with a negative urinalysis and a normal white count.  A urine culture was performed and was normal.  When her cardiac enzymes were negative, a Myoview was performed on June 07, 2010, that showed no ischemia.  There was septal hypokinesis also seen in the echo, felt likely due to previous cardiac valve replacement.  Her EF was 68% and normal on echo as well.  Her medications were adjusted and her blood pressure was better controlled.  On June 08, 2010, Ms. Deborah [name] was noted to have had a run of VT during sleep, but she was asymptomatic and her EF was normal.  Her Lopressor was increased, but no further evaluation is indicated at this time.  She had some diarrhea, but this will be treated with p.r.n. medications. Ms. Deborah [name] was considered stable for discharge on June 08, 2010, to be followed closely as an outpatient.  DISCHARGE INSTRUCTIONS:  Her activity level is to be increased gradually.  She is encouraged to stick to a low-sodium  heart-healthy diet.  She is to follow up with Dr. Norma Fredrickson for Dr. Patty Sermons and get a pro-time on March 13 at 8:30.  She is to follow up with Dr. Ewing Schlein as needed.  DISCHARGE MEDICATIONS: 1. Extra Strength Tylenol 500 mg 1-2 tablets q.4 h. p.r.n. 2. Allopurinol 300 mg 1 tablet daily. 3. Fish oil OTC 1 tablet b.i.d. 4. Dicyclomine 10 mg b.i.d. p.r.n. 5. Ambien 12.5 mg nightly p.r.n. 6. Toprol-XL 100 mg b.i.d., is discontinued. 7. Lopressor 25 mg p.o. t.i.d. 8. Lanoxin 0.25 mg one half tablet daily. 9. Artificial tears daily p.r.n. 10.Zocor 5 mg a day. 11.Lasix. 12.Multivitamin daily. 13.Celebrex 200 mg daily. 14.Prevacid 30 mg a day. 15.Synthroid 88 mcg daily. 16.Potassium chloride 20  mEq daily. 17.Lasix 40 mg a day. 18.Tribenzor 40/5/25, is discontinued. 19.Tribenzor 40/10/25 mg daily. 20.Coumadin 5 mg, 1 tablet on Tuesday, Thursday, Saturday and Sunday,     other days one half tablet. 21.Lomotil tablets, 1 tablet p.o. q.4 h. p.r.n. 22.Loratadine 10 mg daily p.r.n.     Theodore Demark, PA-C   ______________________________ Cassell Clement, M.D.    RB/MEDQ  D:  06/08/2010  T:  06/09/2010  Job:  [id number]  cc:   Petra Kuba, M.D.  Electronically Signed by Theodore Demark PA-C on 06/15/2010 04:00:42 PM Electronically Signed by Cassell Clement M.D. on 06/25/2010 12:29:34 PM

## 2010-06-26 ENCOUNTER — Ambulatory Visit (INDEPENDENT_AMBULATORY_CARE_PROVIDER_SITE_OTHER): Payer: Medicare Other | Admitting: *Deleted

## 2010-06-26 DIAGNOSIS — I359 Nonrheumatic aortic valve disorder, unspecified: Secondary | ICD-10-CM | POA: Insufficient documentation

## 2010-06-26 DIAGNOSIS — I4891 Unspecified atrial fibrillation: Secondary | ICD-10-CM

## 2010-06-26 DIAGNOSIS — Z7901 Long term (current) use of anticoagulants: Secondary | ICD-10-CM

## 2010-06-29 ENCOUNTER — Telehealth: Payer: Self-pay | Admitting: *Deleted

## 2010-06-29 DIAGNOSIS — I10 Essential (primary) hypertension: Secondary | ICD-10-CM

## 2010-06-29 MED ORDER — OLMESARTAN-AMLODIPINE-HCTZ 40-5-12.5 MG PO TABS: ORAL_TABLET | ORAL | Status: DC

## 2010-06-29 NOTE — Telephone Encounter (Signed)
Agree 

## 2010-06-29 NOTE — Telephone Encounter (Signed)
Patient phoned c/o edema in legs and feet and thinks coming from the norvasc portion of tribenzor.  Current dose is tribenzor 40/10/12.5 mg daily.  Has had problems with edema on norvasc 10 mg before.   Will decrease dose to tribenzor 40/5/12.5 mg daily.  Advised to let us know if blood pressure goes up or edema doesn't go down

## 2010-07-03 ENCOUNTER — Ambulatory Visit (INDEPENDENT_AMBULATORY_CARE_PROVIDER_SITE_OTHER): Payer: Medicare Other | Admitting: *Deleted

## 2010-07-03 DIAGNOSIS — I4891 Unspecified atrial fibrillation: Secondary | ICD-10-CM

## 2010-07-03 DIAGNOSIS — Z7901 Long term (current) use of anticoagulants: Secondary | ICD-10-CM

## 2010-07-10 ENCOUNTER — Encounter: Payer: Self-pay | Admitting: Cardiology

## 2010-07-10 ENCOUNTER — Ambulatory Visit (INDEPENDENT_AMBULATORY_CARE_PROVIDER_SITE_OTHER): Payer: Medicare Other | Admitting: Cardiology

## 2010-07-10 ENCOUNTER — Other Ambulatory Visit: Payer: Self-pay | Admitting: Cardiology

## 2010-07-10 DIAGNOSIS — Z954 Presence of other heart-valve replacement: Secondary | ICD-10-CM

## 2010-07-10 DIAGNOSIS — I119 Hypertensive heart disease without heart failure: Secondary | ICD-10-CM

## 2010-07-10 DIAGNOSIS — I4891 Unspecified atrial fibrillation: Secondary | ICD-10-CM

## 2010-07-10 DIAGNOSIS — I359 Nonrheumatic aortic valve disorder, unspecified: Secondary | ICD-10-CM

## 2010-07-10 NOTE — Progress Notes (Signed)
History of Present Illness: This pleasant 77 year old woman is seen for a scheduled followup office visit.  She has a complex past medical history.  She had a St. Jude aortic valve replacement in 1996 for severe aortic valve disease.  She has been in chronic nature fibrillation and has been on long-term Coumadin.  [name] had a history of hypertension.  [name] also had a previous history of breast cancer of the right breast.  She does not have renal artery stenosis per arteriograms which were done in January 2012.  She had a negative Cardiolite stress test and an unremarkable echocardiogram in March 2012.  Current Outpatient Prescriptions  Medication Sig Dispense Refill  . allopurinol (ZYLOPRIM) 300 MG tablet Take 300 mg by mouth daily.        . calcium-vitamin D 500 MG tablet Take 1 tablet by mouth 2 (two) times daily.        . celecoxib (CELEBREX) 200 MG capsule Take 200 mg by mouth daily.        Marland Kitchen dicyclomine (BENTYL) 10 MG capsule Take 10 mg by mouth as needed.        . digoxin (LANOXIN) 0.125 MG tablet Take 125 mcg by mouth daily.        . fexofenadine (ALLEGRA ALLERGY) 180 MG tablet Take 180 mg by mouth daily. As needed       . furosemide (LASIX) 40 MG tablet Take 40 mg by mouth daily.        . lansoprazole (PREVACID) 30 MG capsule Take 30 mg by mouth daily.        Marland Kitchen levothyroxine (SYNTHROID, LEVOTHROID) 88 MCG tablet Take 88 mcg by mouth daily.        . metoprolol (LOPRESSOR) 100 MG tablet Take 75 mg by mouth 3 (three) times daily.       . multivitamin (THERAGRAN) per tablet Take 1 tablet by mouth daily.        . nitroGLYCERIN (NITROSTAT) 0.4 MG SL tablet Place 0.4 mg under the tongue every 5 (five) minutes as needed. For chest pains       . Olmesartan-Amlodipine-HCTZ (TRIBENZOR) 40-5-12.5 MG TABS 1 tablet daily   30 tablet  0  . Omega-3 Fatty Acids (FISH OIL) 1000 MG CAPS Take by mouth 2 (two) times daily.        . potassium chloride SA (K-DUR,KLOR-CON) 20 MEQ tablet Take 20 mEq by mouth  daily.        . simvastatin (ZOCOR) 10 MG tablet Take 10 mg by mouth daily. 1/4 of 10mg       . zolpidem (AMBIEN CR) 12.5 MG CR tablet Take 12.5 mg by mouth at bedtime as needed.        Marland Kitchen DISCONTD: warfarin (COUMADIN) 5 MG tablet Take 5 mg by mouth daily. As directed       . COUMADIN 5 MG tablet TAKE AS DIRECTED  30 tablet  5  . LANOXIN 0.25 MG tablet TAKE ONE-HALF TABLET DAILY  30 tablet  5  . rifaximin (XIFAXAN) 550 MG TABS Take 550 mg by mouth daily.          Allergies  Allergen Reactions  . Amiodarone Shortness Of Breath  . Ciprofloxacin     Rectal bleeding  . Fosamax     GI upset  . Macrodantin (Nitrofurantoin)     GI upset  . Meclizine Swelling    tongue  . Norvasc (Amlodipine Besylate)     edema    Patient Active Problem List  Diagnoses  . Hypothyroidism  . Hypertension  . Arrhythmia  . Atrial fibrillation  . Aortic valve disorders  . S/P aortic valve replacement with metallic valve  . Benign hypertensive heart disease without heart failure    History  Smoking status  . Former Smoker  . Quit date: 04/01/1988  Smokeless tobacco  . Not on file    History  Alcohol Use     Family History  Problem Relation Age of Onset  . Dementia Mother   . Coronary artery disease Father     Review of Systems: Constitutional: no fever chills diaphoresis or fatigue or change in weight.  Head and neck: no hearing loss, no epistaxis, no photophobia or visual disturbance. Respiratory: No cough, shortness of breath or wheezing. Cardiovascular: No chest pain peripheral edema, palpitations. Gastrointestinal:The patient has a lot of problems with abdominal distention and gas and a feeling of fullness.  She is due to see her gastroenterologist later this week.  No  hematochezia or melena. Genitourinary: No dysuria, no frequency, no urgency, no nocturia. Musculoskeletal:No arthralgias, no back pain, no gait disturbance or myalgias. Neurological: No dizziness, no headaches, no  numbness, no seizures, no syncope, no weakness, no tremors. Hematologic: No lymphadenopathy, no easy bruising. Psychiatric: No confusion, no hallucinations, no sleep disturbance.    Physical Exam: Filed Vitals:   07/10/10 1112  BP: 130/70  Pulse: 70  Weight 133.  The general appearance reveals a well-developed well-nourished woman in no distress.Pupils equal and reactive.   Extraocular Movements are full.  There is no scleral icterus.  The mouth and pharynx are normal.  The neck is supple.  The carotids reveal no bruits.  The jugular venous pressure is normal.  The thyroid is not enlarged.  There is no lymphadenopathy.The chest is clear to percussion and auscultation. There are no rales or rhonchi. Expansion of the chest is symmetrical.The precordium is quiet.  The first heart sound is normal.  The second heart sound is physiologically split.  There is no  gallop rub or click.  There is no abnormal lift or heave.  The rhythm is irregular and she has sharp aortic closure sounds at the aortic area.  No aortic insufficiency murmur.She has a systolic flow murmur across her prosthetic valve.  Abdomen reveals hyperactive bowel sounds.  No localizing tenderness.  No mass.  Normal extremity.  No phlebitis or edema.Strength is normal and symmetrical in all extremities.  There is no lateralizing weakness.  There are no sensory deficits.The skin is warm and dry.  There is no rash.   Assessment / Plan:  Continue present medication.  Recheck at at least monthly intervals for protimes and we will see her in 3 months for office visit protime fasting lipid panel chemistries and CBC

## 2010-07-10 NOTE — Assessment & Plan Note (Signed)
The patient was recently hospitalized from 06/04/10 through 06/08/10 for poorly controlled hypertension.  We adjusted her medicine in the hospital and she has been feeling better since then.  Her blood pressure has not been as labile.  [name] not having any chest pain.  She did have a nuclear stress test in the hospital because of abnormal EKG but the nuclear stress test showed no ischemia.  [name] not having any dizziness or syncope or headache.  She denies chest pain or shortness of breath.

## 2010-07-10 NOTE — Assessment & Plan Note (Signed)
The patient has not had any problems related to her mechanical prosthetic aortic valve.  She had an echocardiogram when hospitalized in March 2012 an echo showed an ejection fraction of 68% and normal prosthetic valve function.

## 2010-07-10 NOTE — Assessment & Plan Note (Signed)
She is in chronic atrial fibrillation on Coumadin.  Her INR today is 2.3 and she will stay on same dose and be rechecked in 2 weeks.  She has not had any thromboembolic symptoms.

## 2010-07-24 ENCOUNTER — Ambulatory Visit (INDEPENDENT_AMBULATORY_CARE_PROVIDER_SITE_OTHER): Payer: Medicare Other | Admitting: *Deleted

## 2010-07-24 DIAGNOSIS — I359 Nonrheumatic aortic valve disorder, unspecified: Secondary | ICD-10-CM

## 2010-07-24 DIAGNOSIS — I4891 Unspecified atrial fibrillation: Secondary | ICD-10-CM

## 2010-07-24 DIAGNOSIS — Z954 Presence of other heart-valve replacement: Secondary | ICD-10-CM

## 2010-07-24 LAB — POCT INR: INR: 2.6

## 2010-08-02 ENCOUNTER — Telehealth: Payer: Self-pay | Admitting: Cardiology

## 2010-08-02 NOTE — Telephone Encounter (Signed)
Pt called said she has lymphadema again and wants referral to rehab

## 2010-08-02 NOTE — Telephone Encounter (Signed)
I assume she is talking about lymphedema of her arm from her breast cancer?  Some of the physical therapists with a  physical therapy are the ones that do this.  There previously was a physical therapist at [facility]  that specialized in lymphedema treatment

## 2010-08-02 NOTE — Telephone Encounter (Signed)
Where do I refer her to?

## 2010-08-10 ENCOUNTER — Other Ambulatory Visit: Payer: Self-pay | Admitting: Cardiology

## 2010-08-10 DIAGNOSIS — G500 Trigeminal neuralgia: Secondary | ICD-10-CM

## 2010-08-10 DIAGNOSIS — I10 Essential (primary) hypertension: Secondary | ICD-10-CM

## 2010-08-10 MED ORDER — OLMESARTAN-AMLODIPINE-HCTZ 40-5-12.5 MG PO TABS: ORAL_TABLET | ORAL | Status: DC

## 2010-08-10 NOTE — Telephone Encounter (Signed)
Pt wants samples of tribenzor or Rx She needs before 130 because pharmacy doesn't deliver after that please call

## 2010-08-10 NOTE — Telephone Encounter (Signed)
Patient requested refill

## 2010-08-13 ENCOUNTER — Ambulatory Visit (INDEPENDENT_AMBULATORY_CARE_PROVIDER_SITE_OTHER): Payer: Medicare Other | Admitting: *Deleted

## 2010-08-13 ENCOUNTER — Other Ambulatory Visit: Payer: Self-pay | Admitting: Cardiology

## 2010-08-13 DIAGNOSIS — Z954 Presence of other heart-valve replacement: Secondary | ICD-10-CM

## 2010-08-13 DIAGNOSIS — E7800 Pure hypercholesterolemia, unspecified: Secondary | ICD-10-CM

## 2010-08-13 DIAGNOSIS — I359 Nonrheumatic aortic valve disorder, unspecified: Secondary | ICD-10-CM

## 2010-08-13 DIAGNOSIS — I4891 Unspecified atrial fibrillation: Secondary | ICD-10-CM

## 2010-08-13 LAB — POCT INR: INR: 2.7

## 2010-08-14 NOTE — Assessment & Plan Note (Signed)
OFFICE VISIT   Deborah [name], Deborah [name]  DOB:  31-Dec-1926                                       03/15/2010  [id number]#:[id number]   CHIEF COMPLAINT:  Uncontrolled blood pressure.   HISTORY OF PRESENT ILLNESS:  The patient is an 77 year old female  referred by Dr. Patty Sermons for evaluation for possible renal artery  stenosis causing uncontrolled hypertension.  The patient has a greater  than 20 year history of high blood pressure.  She has had worsening  control over the last 3 months despite adding multiple medications.  Her  blood pressure has been as high as 215/110 recently.  She denies prior  history of stroke.  She denies any history of renal dysfunction.  She  did have a urinalysis done at a recent emergency room visit which showed  no proteinuria.  She also had a creatinine at that time of 0.75.  These  were done in November.  The patient also states she randomly has crampy  abdominal pain and chills which has been worked up extensively by Dr.  Ewing Schlein.  She does not really report flushing type symptoms but states she  just overall feels bad.  She denies any family history of adrenal or  other endocrine tumors.   She currently is on Lasix, Toprol-XL, Tribenzor, spironolactone for her  blood pressure control.  She also has intermittently taken some  clonidine.  In the past she was on Micardis and amlodipine but she is  currently not taking these.   CHRONIC MEDICAL PROBLEMS:  Include hypertension, elevated cholesterol.  She also has a history of aortic valve replacement and is on chronic  Coumadin therapy for a mechanical valve.   PAST MEDICAL HISTORY:  Otherwise remarkable for hysterectomy,  cholecystectomy, treatment of breast cancer in 1994 and aortic valve  replacement in 1996.   SOCIAL HISTORY:  She is widowed.  She is retired.  She is a nonsmoker,  quit 35 years ago.  She drinks 1-2 alcoholic beverages daily.   FAMILY HISTORY:  Remarkable for her  father who had atherosclerosis and  died at age 32.  He was a heavy smoker.   REVIEW OF SYSTEMS:  She has had some weight loss recently.  She also has  a history of atrial fibrillation.  She also has a history of occasional  headaches, decline in her hearing and multiple joint arthritis pain.  She also has some urinary frequency.  All other systems are negative.   MEDICATIONS:  1. Include Coumadin 2.5 mg 5 days, 5 mg twice weekly.  2. Allopurinol 300 mg once a day.  3. Lanoxin 12.5 mg once a day.  4. Celebrex 200 mg once a day.  5. Prevacid 30 mg once a day.  6. Zocor 5 mg once a day.  7. Synthroid 80 mcg daily.  8. Potassium chloride 20 mEq daily.  9. Lasix 40 mg daily.  10.Toprol-XL 100 mg twice a day.  11.Tribenzor 40 mg/5 mg/25 mg once daily.  12.Spirolactone 25 mg half tablet daily.  13.Os-Cal 500 plus D twice daily.  14.Centrum Women's Silver daily.  15.Fish Oil 1000 mg twice daily.  16.Ambien CR for sleep.   She has no known drug allergies.  She also denies any prior history of  contrast allergy.   PHYSICAL EXAMINATION:  Vital signs:  Blood pressure is 147/71 in the  left arm today, heart rate 52 and regular, oxygen saturation is 97%.  HEENT:  Unremarkable.  Neck:  Has 2+ carotid pulses without bruit.  Chest:  Clear to auscultation.  Cardiac:  Regular rate and rhythm with a  3/6 systolic murmur and an audible click.  Abdomen:  Soft, nontender,  nondistended.  No masses.  No bruits.  Extremities:  She has 2+ radial  and 2+ femoral pulses bilaterally.  She has absent popliteal and pedal  pulses.  She has no significant lower extremity edema.  She has no  significant joint deformities.  Neurologic:  She has symmetric upper  extremity and lower extremity motor strength which is 5/5 and symmetric.  Skin:  Has no open ulcers or rashes.   I reviewed her renal artery duplex scan dated 03/13/2010 from [facility]  Imaging.  This suggested a left mid renal artery stenosis,  kidney size  was normal bilaterally 15 cm on the left, 17 cm on the right.  The mid  renal artery on the left side had a velocity of 267 cm/s.  The aorta to  renal ratio was 1.8.  She had mildly increased velocity on the right  side as well but not to as much of an extent.   I also reviewed a CT scan of the abdomen and pelvis dated September of  2011 which was done for abdominal pain.  This shows patent mesenteric  vessels and patent renal vessels.  However, the left adrenal gland was  asymmetrically enlarged compared to the right.  This apparently is a  chronic finding.   In summary, the patient has new onset poorly controlled hypertension  with possible left renal artery stenosis as a cause for this.  However,  since she does have evidence on CT scan of asymmetrical left adrenal  enlargement I believe we need to further evaluate whether or not an  adrenal source may be the cause of her blood pressure problems.  As a  screening we will obtain plasma metanephrines as well as a plasma renin  and aldosterone level.  If these are all normal and show no evidence of  adrenal source of hypertension we will proceed with a renal arteriogram,  possible angioplasty and stenting on 04/06/2010.  We will plan on  stopping her Coumadin on 04/02/2010.  We will start her on Lovenox 1  mg/kg daily on January 3.  She was given a prescription today for eight  60 mg syringes with one refill.  She currently weighs 128 pounds.  Risks, benefits, possible complications and procedure details of the  renal arteriogram, possible renal artery angioplasty and stenting were  explained to the patient and her daughter today.  Also explained to them  that if we do truly find that renal artery stenosis is a possible cause  of her uncontrolled hypertension the results of renal artery stenting  show 30% improvement, 30% cure, 30% no change.  She understands and  agrees to proceed.     Janetta Hora. Fields, MD   Electronically Signed   CEF/MEDQ  D:  03/15/2010  T:  03/16/2010  Job:  [id number]   cc:   Cassell Clement, M.D.

## 2010-08-14 NOTE — Assessment & Plan Note (Signed)
OFFICE VISIT   GERA, INBODEN  DOB:  February 06, 1927                                       04/12/2010  DGLOV#:[id number]   The patient underwent mesenteric and renal arteriogram.  She had no  significant mesenteric or renal artery stenosis.  She will follow up  with [place] on an as-needed basis.     Janetta Hora. Fields, MD  Electronically Signed   CEF/MEDQ  D:  04/12/2010  T:  04/13/2010  Job:  [id number]   cc:   Petra Kuba, M.D.  Cassell Clement, M.D.

## 2010-08-16 ENCOUNTER — Telehealth: Payer: Self-pay | Admitting: Cardiology

## 2010-08-16 NOTE — Telephone Encounter (Signed)
Pt called she says she found a tick on her leg that was embedded in her skin and wanted to talk to you about it please call

## 2010-08-16 NOTE — Telephone Encounter (Signed)
Left message

## 2010-08-17 MED ORDER — SIMVASTATIN 20 MG PO TABS: ORAL_TABLET | ORAL | Status: DC

## 2010-08-17 NOTE — Telephone Encounter (Signed)
Refilled zocor with new dose

## 2010-08-17 NOTE — Telephone Encounter (Signed)
Called patient again today since she had not called back.  Patient stated she found a tick on her yesterday that she had to get out in pieces.  She wanted to know if there was anything she should take as a preventative measure since it came out in pieces.  Did advise to go to urgent care if she develops fever over weekend and to look out for red bullseye in future.  Please advise

## 2010-08-17 NOTE — Op Note (Signed)
   Deborah [name], Deborah [name] [facility]                       ACCOUNT NO.:  [id number]   MEDICAL RECORD NO.:  [id number]                   PATIENT TYPE:  OIB   LOCATION:  2874                                 FACILITY:  [facility]   PHYSICIAN:  Cassell Clement, M.D.              DATE OF BIRTH:  1926/05/06   DATE OF PROCEDURE:  08/11/2002  DATE OF DISCHARGE:  08/11/2002                                 OPERATIVE REPORT   INDICATIONS FOR PROCEDURE:  This is a middle-aged patient with atrial  fibrillation and aortic valve St. Jude prosthetic valve.  She has been in  atrial fibrillation for several months. She is on long-term Coumadin.  She  failed to convert on outpatient oral medication.   DESCRIPTION OF PROCEDURE:  The patient was given IV anesthesia by Dr. Katrinka Blazing  following which she was given an initial shock using AP paddles of 50  watts/seconds.  She failed to convert.  She was then given a second shock of  275 watts/seconds and converted to junctional bradycardia with occasional P  waves.  Rhythm post cardioversion is junctional with wandering atrial  pacemaker.  Hopefully this represents a somewhat dormant left atrium with  anticipated improvement in atrial function over the next 48 hours. The  patient will be checked back in the office on Aug 13, 2002, for office visit  and EKG. She is to be resting at home for the next two days.                                               Cassell Clement, M.D.    TB/MEDQ  D:  08/11/2002  T:  08/12/2002  Job:  [id number]

## 2010-08-19 NOTE — Telephone Encounter (Signed)
Agree with advice given

## 2010-08-20 ENCOUNTER — Ambulatory Visit: Payer: Medicare Other | Attending: Cardiology | Admitting: Physical Therapy

## 2010-08-20 DIAGNOSIS — I890 Lymphedema, not elsewhere classified: Secondary | ICD-10-CM | POA: Insufficient documentation

## 2010-08-20 DIAGNOSIS — IMO0001 Reserved for inherently not codable concepts without codable children: Secondary | ICD-10-CM | POA: Insufficient documentation

## 2010-08-23 ENCOUNTER — Encounter: Payer: Medicare Other | Admitting: *Deleted

## 2010-08-29 ENCOUNTER — Ambulatory Visit: Payer: Medicare Other | Admitting: Physical Therapy

## 2010-09-03 ENCOUNTER — Telehealth: Payer: Self-pay | Admitting: Cardiology

## 2010-09-03 ENCOUNTER — Encounter: Payer: Medicare Other | Admitting: Physical Therapy

## 2010-09-03 ENCOUNTER — Ambulatory Visit (INDEPENDENT_AMBULATORY_CARE_PROVIDER_SITE_OTHER): Payer: Medicare Other | Admitting: *Deleted

## 2010-09-03 DIAGNOSIS — I359 Nonrheumatic aortic valve disorder, unspecified: Secondary | ICD-10-CM

## 2010-09-03 DIAGNOSIS — I4891 Unspecified atrial fibrillation: Secondary | ICD-10-CM

## 2010-09-03 DIAGNOSIS — Z954 Presence of other heart-valve replacement: Secondary | ICD-10-CM

## 2010-09-03 NOTE — Telephone Encounter (Signed)
Coming for protime and will then probably refer to ENT per Dr. Patty Sermons

## 2010-09-03 NOTE — Telephone Encounter (Signed)
Has had 3 nose bleeds last 7-10:30. Whom does she need to go to.

## 2010-09-05 ENCOUNTER — Encounter: Payer: Medicare Other | Admitting: *Deleted

## 2010-09-05 ENCOUNTER — Encounter: Payer: Medicare Other | Admitting: Physical Therapy

## 2010-09-10 ENCOUNTER — Encounter: Payer: Medicare Other | Admitting: *Deleted

## 2010-09-10 ENCOUNTER — Encounter: Payer: Medicare Other | Admitting: Physical Therapy

## 2010-09-12 ENCOUNTER — Encounter: Payer: Medicare Other | Admitting: Physical Therapy

## 2010-09-17 ENCOUNTER — Encounter: Payer: Medicare Other | Admitting: Physical Therapy

## 2010-09-19 ENCOUNTER — Ambulatory Visit (INDEPENDENT_AMBULATORY_CARE_PROVIDER_SITE_OTHER): Payer: Medicare Other | Admitting: *Deleted

## 2010-09-19 ENCOUNTER — Other Ambulatory Visit (INDEPENDENT_AMBULATORY_CARE_PROVIDER_SITE_OTHER): Payer: Medicare Other | Admitting: *Deleted

## 2010-09-19 ENCOUNTER — Encounter: Payer: Medicare Other | Admitting: Physical Therapy

## 2010-09-19 ENCOUNTER — Telehealth: Payer: Self-pay | Admitting: *Deleted

## 2010-09-19 DIAGNOSIS — I4891 Unspecified atrial fibrillation: Secondary | ICD-10-CM

## 2010-09-19 DIAGNOSIS — I359 Nonrheumatic aortic valve disorder, unspecified: Secondary | ICD-10-CM

## 2010-09-19 DIAGNOSIS — R195 Other fecal abnormalities: Secondary | ICD-10-CM

## 2010-09-19 DIAGNOSIS — Z954 Presence of other heart-valve replacement: Secondary | ICD-10-CM

## 2010-09-19 DIAGNOSIS — K921 Melena: Secondary | ICD-10-CM

## 2010-09-19 LAB — CBC WITH DIFFERENTIAL/PLATELET
Eosinophils Relative: 0.7 % (ref 0.0–5.0)
HCT: 35.5 % — ABNORMAL LOW (ref 36.0–46.0)
Hemoglobin: 12.2 g/dL (ref 12.0–15.0)
Lymphs Abs: 1.1 10*3/uL (ref 0.7–4.0)
Monocytes Relative: 6.5 % (ref 3.0–12.0)
Neutro Abs: 4.4 10*3/uL (ref 1.4–7.7)
RBC: 3.66 Mil/uL — ABNORMAL LOW (ref 3.87–5.11)
WBC: 5.9 10*3/uL (ref 4.5–10.5)

## 2010-09-19 LAB — POCT INR: INR: 2.5

## 2010-09-19 NOTE — Telephone Encounter (Signed)
Called patient to see if she was not having nose bleeds since she started putting vaseline in her nose, wanted to just cancel her appointment with ENT.  Called Dr Crossely's office and cancelled.  Patient did state she had some very dark stools recently.  Coming today for cbc, stool cards, and INR.  Ok per [employer]

## 2010-09-20 ENCOUNTER — Other Ambulatory Visit: Payer: Self-pay | Admitting: Cardiology

## 2010-09-20 ENCOUNTER — Other Ambulatory Visit (INDEPENDENT_AMBULATORY_CARE_PROVIDER_SITE_OTHER): Payer: Medicare Other | Admitting: *Deleted

## 2010-09-21 ENCOUNTER — Other Ambulatory Visit: Payer: Self-pay | Admitting: Cardiology

## 2010-09-21 ENCOUNTER — Other Ambulatory Visit: Payer: Medicare Other

## 2010-09-21 LAB — HEMOCCULT SLIDES (X 3 CARDS)

## 2010-09-24 ENCOUNTER — Telehealth: Payer: Self-pay | Admitting: *Deleted

## 2010-09-24 DIAGNOSIS — Z79899 Other long term (current) drug therapy: Secondary | ICD-10-CM

## 2010-09-24 DIAGNOSIS — E876 Hypokalemia: Secondary | ICD-10-CM

## 2010-09-24 DIAGNOSIS — E785 Hyperlipidemia, unspecified: Secondary | ICD-10-CM

## 2010-09-24 NOTE — Telephone Encounter (Signed)
Advised of results

## 2010-09-24 NOTE — Telephone Encounter (Signed)
Discussed with Dr Patty Sermons and he said ok to fill.  Does want her to use only as needed and to try not to take daily.  Advised patient

## 2010-09-24 NOTE — Telephone Encounter (Signed)
Message copied by Burnell Blanks on Mon Sep 24, 2010 12:38 PM ------      Message from: Cassell Clement      Created: Sat Sep 22, 2010  4:25 PM       Please report.  The 2 Hemoccult slides were negative.  There is no evidence of bleeding at the present time although she may have bled transiently when she had the dark stools previously.  Recheck a CBC at next office visit or protime visit.

## 2010-09-24 NOTE — Progress Notes (Signed)
Advised of labs 

## 2010-09-24 NOTE — Telephone Encounter (Signed)
Message copied by Burnell Blanks on Mon Sep 24, 2010 12:48 PM ------      Message from: Cassell Clement      Created: Sat Sep 22, 2010  4:16 PM       Her hemoglobin and hematocrit are down somewhat from where they usually are.  We need to see what her stool Hemoccult cards show.  Recheck another CBC when she returns for her next protime.Since she had some transient dark stools she needs to continue her Prevacid and she should try to cut back on her Celebrex if possible.

## 2010-09-24 NOTE — Telephone Encounter (Signed)
Advised of results and to decrease celebrex and use prn if possible

## 2010-09-28 ENCOUNTER — Encounter: Payer: Medicare Other | Admitting: *Deleted

## 2010-10-15 ENCOUNTER — Other Ambulatory Visit: Payer: Self-pay | Admitting: Cardiology

## 2010-10-15 ENCOUNTER — Other Ambulatory Visit (INDEPENDENT_AMBULATORY_CARE_PROVIDER_SITE_OTHER): Payer: Medicare Other | Admitting: *Deleted

## 2010-10-15 ENCOUNTER — Ambulatory Visit (INDEPENDENT_AMBULATORY_CARE_PROVIDER_SITE_OTHER): Payer: Medicare Other | Admitting: *Deleted

## 2010-10-15 ENCOUNTER — Encounter: Payer: Self-pay | Admitting: Cardiology

## 2010-10-15 ENCOUNTER — Ambulatory Visit (INDEPENDENT_AMBULATORY_CARE_PROVIDER_SITE_OTHER): Payer: Medicare Other | Admitting: Cardiology

## 2010-10-15 VITALS — BP 140/78 | HR 66 | Wt 134.0 lb

## 2010-10-15 DIAGNOSIS — E785 Hyperlipidemia, unspecified: Secondary | ICD-10-CM

## 2010-10-15 DIAGNOSIS — Z954 Presence of other heart-valve replacement: Secondary | ICD-10-CM

## 2010-10-15 DIAGNOSIS — I359 Nonrheumatic aortic valve disorder, unspecified: Secondary | ICD-10-CM

## 2010-10-15 DIAGNOSIS — I4891 Unspecified atrial fibrillation: Secondary | ICD-10-CM

## 2010-10-15 DIAGNOSIS — R109 Unspecified abdominal pain: Secondary | ICD-10-CM | POA: Insufficient documentation

## 2010-10-15 DIAGNOSIS — E7800 Pure hypercholesterolemia, unspecified: Secondary | ICD-10-CM

## 2010-10-15 DIAGNOSIS — E039 Hypothyroidism, unspecified: Secondary | ICD-10-CM

## 2010-10-15 DIAGNOSIS — Z79899 Other long term (current) drug therapy: Secondary | ICD-10-CM

## 2010-10-15 DIAGNOSIS — I119 Hypertensive heart disease without heart failure: Secondary | ICD-10-CM

## 2010-10-15 DIAGNOSIS — E876 Hypokalemia: Secondary | ICD-10-CM

## 2010-10-15 LAB — POCT INR: INR: 2.8

## 2010-10-15 LAB — CBC WITH DIFFERENTIAL/PLATELET
Basophils Absolute: 0 10*3/uL (ref 0.0–0.1)
Eosinophils Relative: 0.7 % (ref 0.0–5.0)
MCV: 97.7 fl (ref 78.0–100.0)
Monocytes Absolute: 0.2 10*3/uL (ref 0.1–1.0)
Monocytes Relative: 5.7 % (ref 3.0–12.0)
Neutrophils Relative %: 75.5 % (ref 43.0–77.0)
Platelets: 158 10*3/uL (ref 150.0–400.0)
RDW: 15.2 % — ABNORMAL HIGH (ref 11.5–14.6)
WBC: 4.1 10*3/uL — ABNORMAL LOW (ref 4.5–10.5)

## 2010-10-15 LAB — BASIC METABOLIC PANEL
Chloride: 96 mEq/L (ref 96–112)
GFR: 94.49 mL/min (ref >60.00)
Glucose, Bld: 115 mg/dL — ABNORMAL HIGH (ref 70–99)
Potassium: 4 mEq/L (ref 3.5–5.1)
Sodium: 133 mEq/L — ABNORMAL LOW (ref 135–145)

## 2010-10-15 LAB — HEPATIC FUNCTION PANEL
AST: 27 U/L (ref 0–37)
Albumin: 3.9 g/dL (ref 3.5–5.2)
Alkaline Phosphatase: 59 U/L (ref 39–117)
Total Bilirubin: 0.5 mg/dL (ref 0.3–1.2)

## 2010-10-15 LAB — LIPID PANEL
Total CHOL/HDL Ratio: 3
VLDL: 44.2 mg/dL — ABNORMAL HIGH (ref 0.0–40.0)

## 2010-10-15 NOTE — Assessment & Plan Note (Signed)
The patient is clinically euthyroid.  We will plan to check a TSH on her next visit

## 2010-10-15 NOTE — Assessment & Plan Note (Signed)
The patient has a history of essential hypertension.  She has been taking Lopressor 25 mg 3 times a day as well as Tribenzor Once a day And furosemide once a day.  She has not been having any headaches or dizzy spells.  She has not been having any symptoms of congestive heart failure.  No chest pain.

## 2010-10-15 NOTE — Assessment & Plan Note (Signed)
Patient has history of chronic established atrial fibrillation.  Her INR is therapeutic.  She has not had any thromboembolic symptoms.  [name] not having hematochezia or melena

## 2010-10-15 NOTE — Assessment & Plan Note (Signed)
The patient has had a long history of abdominal pain related to hyper peristalsis and loud forceful bowel sounds.  She sees Dr. Ewing Schlein.  Presently she is doing fairly well on her regimen of Benadryl generic 10 mg 3 times a day with meals.

## 2010-10-15 NOTE — Progress Notes (Signed)
Deborah [name] Date of Birth:  10-Oct-1926 [facility] [address]   [address] [place], [place]  [address] [phone]           Fax   [phone]  History of Present Illness: This pleasant 77 year old woman is seen for a scheduled 3 month followup office visit.  She has a history of chronic atrial fibrillation on Coumadin.  She has a history of previous St. Jude aortic valve replacement 02/04/95.  She has been doing well since last visit.  [name] not having any chest pain or shortness of breath.  She had no thromboembolic episodes.  She does not have any history of ischemic heart disease and had a negative nuclear stress test in March 2012 at [facility].  He does have a known abnormal EKG.  [name] had previous history of high blood pressure she does not have renal artery stenosis per arteriograms in January 2012.  Her last echocardiogram was in 2012 done at [facility].  She has had a lot of GI issues which have been followed by Dr. Ewing Schlein.  Presently she is stable and doing well on a regimen of generic Bentyl 10 mg 3 times a day with meals.  Her blood pressure is adequately controlled on her present regimen which includes Tribenzor And she takes metoprolol 25 mg one in the morning and 2 in the evening.  She is on Lasix once a day  Current Outpatient Prescriptions  Medication Sig Dispense Refill  . allopurinol (ZYLOPRIM) 300 MG tablet Take 300 mg by mouth daily.        . calcium-vitamin D 500 MG tablet Take 1 tablet by mouth 2 (two) times daily.        . CELEBREX 200 MG capsule TAKE ONE CAPSULE DAILY AS NEEDED  30 each  3  . COUMADIN 5 MG tablet TAKE AS DIRECTED  30 tablet  5  . dicyclomine (BENTYL) 10 MG capsule Take 10 mg by mouth as needed.        . fexofenadine (ALLEGRA ALLERGY) 180 MG tablet Take 180 mg by mouth daily. As needed       . furosemide (LASIX) 40 MG tablet Take 40 mg by mouth daily.        Marland Kitchen LANOXIN 0.25 MG tablet TAKE ONE-HALF  TABLET DAILY  30 tablet  5  . lansoprazole (PREVACID) 30 MG capsule Take 30 mg by mouth daily.        Marland Kitchen levothyroxine (SYNTHROID, LEVOTHROID) 88 MCG tablet Take 88 mcg by mouth daily.        . metoprolol tartrate (LOPRESSOR) 25 MG tablet Take 25 mg by mouth 3 (three) times daily.        . multivitamin (THERAGRAN) per tablet Take 1 tablet by mouth daily.        . nitroGLYCERIN (NITROSTAT) 0.4 MG SL tablet Place 0.4 mg under the tongue every 5 (five) minutes as needed. For chest pains       . Olmesartan-Amlodipine-HCTZ (TRIBENZOR) 40-5-12.5 MG TABS 1 tablet daily   30 tablet  11  . Omega-3 Fatty Acids (FISH OIL) 1000 MG CAPS Take by mouth 2 (two) times daily.        . potassium chloride SA (K-DUR,KLOR-CON) 20 MEQ tablet Take 20 mEq by mouth daily.        . simvastatin (ZOCOR) 20 MG tablet 1/2 daily or as directed  30 tablet  11  . zolpidem (AMBIEN CR) 12.5 MG CR  tablet Take 12.5 mg by mouth at bedtime as needed.          Allergies  Allergen Reactions  . Amiodarone Shortness Of Breath  . Ciprofloxacin     Rectal bleeding  . Fosamax     GI upset  . Macrodantin (Nitrofurantoin)     GI upset  . Meclizine Swelling    tongue  . Norvasc (Amlodipine Besylate)     edema    Patient Active Problem List  Diagnoses  . Hypothyroidism  . Hypertension  . Arrhythmia  . Atrial fibrillation  . Aortic valve disorders  . S/P aortic valve replacement with metallic valve  . Benign hypertensive heart disease without heart failure  . Abdominal pain    History  Smoking status  . Former Smoker  . Quit date: 04/01/1988  Smokeless tobacco  . Not on file    History  Alcohol Use     Family History  Problem Relation Age of Onset  . Dementia Mother   . Coronary artery disease Father     Review of Systems: Constitutional: no fever chills diaphoresis or fatigue or change in weight.  Head and neck: no hearing loss, no epistaxis, no photophobia or visual disturbance. Respiratory: No cough,  shortness of breath or wheezing. Cardiovascular: No chest pain peripheral edema, palpitations. Gastrointestinal: No abdominal distention, no abdominal pain, no change in bowel habits hematochezia or melena. Genitourinary: No dysuria, no frequency, no urgency, no nocturia. Musculoskeletal:No arthralgias, no back pain, no gait disturbance or myalgias. Neurological: No dizziness, no headaches, no numbness, no seizures, no syncope, no weakness, no tremors. Hematologic: No lymphadenopathy, no easy bruising. Psychiatric: No confusion, no hallucinations, no sleep disturbance.    Physical Exam: Filed Vitals:   10/15/10 0912  BP: 140/78  Pulse: 66  The general appearance reveals a well-developed well-nourished woman in no distress.Pupils equal and reactive.   Extraocular Movements are full.  There is no scleral icterus.  The mouth and pharynx are normal.  The neck is supple.  The carotids reveal no bruits.  The jugular venous pressure is normal.  The thyroid is not enlarged.  There is no lymphadenopathy.The chest is clear to percussion and auscultation. There are no rales or rhonchi. Expansion of the chest is symmetrical.The heart reveals an irregular rhythm and good opening and closing aortic valve metallic clicks.  No aortic insufficiency.  No gallop or rub.The abdomen is soft and nontender. Bowel sounds are normal. The liver and spleen are not enlarged. There Are no abdominal masses. There are no bruits.The pedal pulses are good.  There is no phlebitis or edema.  There is no cyanosis or clubbing.Strength is normal and symmetrical in all extremities.  There is no lateralizing weakness.  There are no sensory deficits.   Assessment / Plan: Continue present medication.  Recheck in 3 months for followup office visit and fasting lab work.  Continue monthly protime followup at the Coumadin clinic.  [place] see about getting someone there to draw  protimes to save her a long drive back to [facility]

## 2010-10-15 NOTE — Assessment & Plan Note (Addendum)
The patient has a history of aortic valve replacement with a St. Jude valve on 02/04/95.  She is on long-term Coumadin.  She has not been having any symptoms of congestive heart failure or angina or thromboembolism.Her last echocardiogram was in 2009

## 2010-10-16 ENCOUNTER — Telehealth: Payer: Self-pay | Admitting: *Deleted

## 2010-10-16 NOTE — Telephone Encounter (Signed)
LMOM for pt to call back for lab results.

## 2010-10-17 ENCOUNTER — Telehealth: Payer: Self-pay | Admitting: *Deleted

## 2010-10-17 NOTE — Telephone Encounter (Signed)
Message copied by Burnell Blanks on Wed Oct 17, 2010 11:52 AM ------      Message from: Cassell Clement      Created: Mon Oct 15, 2010  8:24 PM       Please report.CBC is normal.  No anemia.  White count normal.Kidney function is normal.  The blood sugar slightly high at 115.The liver tests are normal.The cholesterol is better at 184.  The triglycerides are slightly higher at 221.Watch carbohydrates and sweets.  Continue on same medication.

## 2010-10-17 NOTE — Progress Notes (Signed)
Advised of labs 

## 2010-10-17 NOTE — Progress Notes (Signed)
advised

## 2010-10-17 NOTE — Progress Notes (Signed)
Advised 

## 2010-10-17 NOTE — Telephone Encounter (Signed)
Advised of labs 

## 2010-10-17 NOTE — Telephone Encounter (Signed)
Message copied by Burnell Blanks on Wed Oct 17, 2010 11:54 AM ------      Message from: Cassell Clement      Created: Mon Oct 15, 2010  8:24 PM       The LDL is 54 which is normal.

## 2010-10-23 ENCOUNTER — Telehealth: Payer: Self-pay | Admitting: Cardiology

## 2010-10-23 NOTE — Telephone Encounter (Signed)
Patient has a question about a medication that another provider wants her to take.

## 2010-10-23 NOTE — Telephone Encounter (Signed)
Dr Ewing Schlein gave her Restor probiotic, is this ok to take.

## 2010-10-23 NOTE — Telephone Encounter (Signed)
Spoke with Lawson Fiscal and she stated ok.  Advised to check coumadin in week, she will be out of town.  She will start when she gets back and is already scheduled for protime on 8/13

## 2010-11-12 ENCOUNTER — Ambulatory Visit (INDEPENDENT_AMBULATORY_CARE_PROVIDER_SITE_OTHER): Payer: Medicare Other | Admitting: *Deleted

## 2010-11-12 DIAGNOSIS — I359 Nonrheumatic aortic valve disorder, unspecified: Secondary | ICD-10-CM

## 2010-11-12 DIAGNOSIS — Z954 Presence of other heart-valve replacement: Secondary | ICD-10-CM

## 2010-11-12 DIAGNOSIS — I4891 Unspecified atrial fibrillation: Secondary | ICD-10-CM

## 2010-11-12 LAB — POCT INR: INR: 3.3

## 2010-12-04 ENCOUNTER — Other Ambulatory Visit: Payer: Self-pay | Admitting: Cardiology

## 2010-12-10 ENCOUNTER — Other Ambulatory Visit: Payer: Self-pay | Admitting: Cardiology

## 2010-12-10 ENCOUNTER — Other Ambulatory Visit: Payer: Self-pay | Admitting: *Deleted

## 2010-12-10 ENCOUNTER — Ambulatory Visit (INDEPENDENT_AMBULATORY_CARE_PROVIDER_SITE_OTHER): Payer: Medicare Other | Admitting: *Deleted

## 2010-12-10 DIAGNOSIS — I359 Nonrheumatic aortic valve disorder, unspecified: Secondary | ICD-10-CM

## 2010-12-10 DIAGNOSIS — G4700 Insomnia, unspecified: Secondary | ICD-10-CM

## 2010-12-10 DIAGNOSIS — Z954 Presence of other heart-valve replacement: Secondary | ICD-10-CM

## 2010-12-10 DIAGNOSIS — I4891 Unspecified atrial fibrillation: Secondary | ICD-10-CM

## 2010-12-10 LAB — POCT INR: INR: 3

## 2010-12-10 MED ORDER — ZOLPIDEM TARTRATE ER 12.5 MG PO TBCR: 12.5000 mg | EXTENDED_RELEASE_TABLET | Freq: Every evening | ORAL | Status: DC | PRN

## 2010-12-10 NOTE — Telephone Encounter (Signed)
Refilled zolpidem.

## 2010-12-20 ENCOUNTER — Other Ambulatory Visit: Payer: Self-pay | Admitting: *Deleted

## 2010-12-20 DIAGNOSIS — I119 Hypertensive heart disease without heart failure: Secondary | ICD-10-CM

## 2010-12-20 MED ORDER — FUROSEMIDE 40 MG PO TABS: 40.0000 mg | ORAL_TABLET | Freq: Every day | ORAL | Status: DC

## 2010-12-20 NOTE — Telephone Encounter (Signed)
Refilled meds per fax request.  

## 2011-01-14 ENCOUNTER — Ambulatory Visit (INDEPENDENT_AMBULATORY_CARE_PROVIDER_SITE_OTHER): Payer: Medicare Other | Admitting: Cardiology

## 2011-01-14 ENCOUNTER — Encounter: Payer: Self-pay | Admitting: Cardiology

## 2011-01-14 ENCOUNTER — Ambulatory Visit (INDEPENDENT_AMBULATORY_CARE_PROVIDER_SITE_OTHER): Payer: Medicare Other | Admitting: *Deleted

## 2011-01-14 VITALS — BP 130/78 | HR 70 | Ht 60.0 in | Wt 134.0 lb

## 2011-01-14 DIAGNOSIS — E7800 Pure hypercholesterolemia, unspecified: Secondary | ICD-10-CM

## 2011-01-14 DIAGNOSIS — I359 Nonrheumatic aortic valve disorder, unspecified: Secondary | ICD-10-CM

## 2011-01-14 DIAGNOSIS — I4891 Unspecified atrial fibrillation: Secondary | ICD-10-CM

## 2011-01-14 DIAGNOSIS — Z954 Presence of other heart-valve replacement: Secondary | ICD-10-CM

## 2011-01-14 DIAGNOSIS — E039 Hypothyroidism, unspecified: Secondary | ICD-10-CM

## 2011-01-14 DIAGNOSIS — I10 Essential (primary) hypertension: Secondary | ICD-10-CM

## 2011-01-14 DIAGNOSIS — I119 Hypertensive heart disease without heart failure: Secondary | ICD-10-CM

## 2011-01-14 LAB — LIPID PANEL
Cholesterol: 193 mg/dL (ref 0–200)
Total CHOL/HDL Ratio: 3
Triglycerides: 257 mg/dL — ABNORMAL HIGH (ref 0.0–149.0)

## 2011-01-14 LAB — HEPATIC FUNCTION PANEL
AST: 25 U/L (ref 0–37)
Albumin: 3.9 g/dL (ref 3.5–5.2)

## 2011-01-14 LAB — POCT INR: INR: 2.9

## 2011-01-14 LAB — BASIC METABOLIC PANEL
Chloride: 99 mEq/L (ref 96–112)
Creatinine, Ser: 0.7 mg/dL (ref 0.4–1.2)
Potassium: 3.7 mEq/L (ref 3.5–5.1)
Sodium: 138 mEq/L (ref 135–145)

## 2011-01-14 NOTE — Progress Notes (Signed)
Deborah [name] Date of Birth:  [birth year] [facility] / [facility] [address]   [address] [place], [place]  [address] [phone]           Fax   [phone]  History of Present Illness: This pleasant 77 year old woman is seen for a scheduled followup office visit.  She has a history of chronic atrial fibrillation and is on long-term Coumadin.  She had St. Jude's aortic valve replacement with mechanical valve in 1996.  She has not had any thromboembolic episodes.  She does not have any history of ischemic heart disease.  She had a negative nuclear stress test in March 2012.  She does have an known abnormal EKG.  She said a history of essential hypertension.  She had arteriograms in January 2012, which showed that she does not have any renal artery stenosis.  He has a history of a lot of GI issues followed by Dr. Remo Lipps.  She has hypermotility, which has been improved with taking Bentyl 10 mg twice a day before breakfast and supper, and sometimes 3 times a day.  She has had a good blood pressure response to her present medication.  Current Outpatient Prescriptions  Medication Sig Dispense Refill  . allopurinol (ZYLOPRIM) 300 MG tablet Take 300 mg by mouth daily.        . calcium-vitamin D 500 MG tablet Take 1 tablet by mouth 2 (two) times daily.        . CELEBREX 200 MG capsule TAKE ONE CAPSULE DAILY AS NEEDED  30 each  3  . COUMADIN 5 MG tablet TAKE AS DIRECTED  30 tablet  5  . dicyclomine (BENTYL) 10 MG capsule Take 10 mg by mouth as needed.        . fexofenadine (ALLEGRA ALLERGY) 180 MG tablet Take 180 mg by mouth daily. As needed       . furosemide (LASIX) 40 MG tablet Take 1 tablet (40 mg total) by mouth daily.  30 tablet  11  . LANOXIN 0.25 MG tablet TAKE ONE-HALF TABLET DAILY  30 tablet  5  . lansoprazole (PREVACID) 30 MG capsule Take 30 mg by mouth daily.        . metoprolol tartrate (LOPRESSOR) 25 MG tablet Take 25 mg by mouth 3 (three) times daily.        .  multivitamin (THERAGRAN) per tablet Take 1 tablet by mouth daily.        . nitroGLYCERIN (NITROSTAT) 0.4 MG SL tablet ONE TABLET UNDER TONGUE AS NEEDED  25 tablet  11  . Olmesartan-Amlodipine-HCTZ (TRIBENZOR) 40-5-12.5 MG TABS 1 tablet daily   30 tablet  11  . Omega-3 Fatty Acids (FISH OIL) 1000 MG CAPS Take by mouth 2 (two) times daily.        . potassium chloride SA (K-DUR,KLOR-CON) 20 MEQ tablet Take 20 mEq by mouth daily.        . simvastatin (ZOCOR) 20 MG tablet 1/2 daily or as directed  30 tablet  11  . SYNTHROID 88 MCG tablet TAKE ONE TABLET EVERY DAY  30 tablet  6  . zolpidem (AMBIEN CR) 12.5 MG CR tablet Take 1 tablet (12.5 mg total) by mouth at bedtime as needed.  30 tablet  5    Allergies  Allergen Reactions  . Amiodarone Shortness Of Breath  . Ciprofloxacin     Rectal bleeding  . Fosamax     GI upset  . Macrodantin (Nitrofurantoin)     GI upset  .  Meclizine Swelling    tongue  . Norvasc (Amlodipine Besylate)     edema    Patient Active Problem List  Diagnoses  . Hypothyroidism  . Hypertension  . Arrhythmia  . Atrial fibrillation  . Aortic valve disorders  . S/P aortic valve replacement with metallic valve  . Benign hypertensive heart disease without heart failure  . Abdominal pain    History  Smoking status  . Former Smoker  . Quit date: 04/01/1988  Smokeless tobacco  . Not on file    History  Alcohol Use     Family History  Problem Relation Age of Onset  . Dementia Mother   . Coronary artery disease Father     Review of Systems: Constitutional: no fever chills diaphoresis or fatigue or change in weight.  Head and neck: no hearing loss, no epistaxis, no photophobia or visual disturbance. Respiratory: No cough, shortness of breath or wheezing. Cardiovascular: No chest pain peripheral edema, palpitations. Gastrointestinal: No abdominal distention, no abdominal pain, no change in bowel habits hematochezia or melena. Genitourinary: No dysuria, no  frequency, no urgency, no nocturia. Musculoskeletal:No arthralgias, no back pain, no gait disturbance or myalgias. Neurological: No dizziness, no headaches, no numbness, no seizures, no syncope, no weakness, no tremors. Hematologic: No lymphadenopathy, no easy bruising. Psychiatric: No confusion, no hallucinations, no sleep disturbance.    Physical Exam: Filed Vitals:   01/14/11 0916  BP: 130/78  Pulse: 70   general appearance reveals a well-developed, well-nourished, elderly woman in no distress.Pupils equal and reactive.   Extraocular Movements are full.  There is no scleral icterus.  The mouth and pharynx are normal.  The neck is supple.  The carotids reveal no bruits.  The jugular venous pressure is normal.  The thyroid is not enlarged.  There is no lymphadenopathy.  The chest is clear to percussion and auscultation. There are no rales or rhonchi. Expansion of the chest is symmetrical.  Heart reveals good opening and closing aortic valve metallic clicks.  He has a grade 2/6 systolic ejection murmur at the base.  There is no diastolic murmur.  There is no gallop or rub.The abdomen is soft and nontender. Bowel sounds are normal. The liver and spleen are not enlarged. There Are no abdominal masses. There are no bruits.  The pedal pulses are good.  There is no phlebitis or edema.  There is no cyanosis or clubbing. Strength is normal and symmetrical in all extremities.  There is no lateralizing weakness.  There are no sensory deficits.  The skin is warm and dry.  There is no rash.    Assessment / Plan:  Recheck in 3 months for followup office visit and lab work.  2.  Return in 6 weeks for a followup protime

## 2011-01-14 NOTE — Assessment & Plan Note (Signed)
Patient is on long-term Coumadin for her atrial fibrillation with valvular heart disease.  [name] had no thromboembolic episodes.  Her prothrombin times have generally been in a good therapeutic range.  [name] had no recent problems with GI bleeding or excessive bruising

## 2011-01-14 NOTE — Assessment & Plan Note (Signed)
The blood pressure has been stable on current therapy.  [name] not having any headaches or dizzy spells.

## 2011-01-14 NOTE — Assessment & Plan Note (Signed)
She has a history of hypothyroidism.  She is clinically euthyroid at the present time

## 2011-01-14 NOTE — Patient Instructions (Signed)
continue same dose of medications  Your physician wants you to follow-up in: 3 months You will receive a reminder letter in the mail two months in advance. If you don't receive a letter, please call our office to schedule the follow-up appointment.

## 2011-01-15 LAB — TSH: TSH: 1.85 u[IU]/mL (ref 0.35–5.50)

## 2011-01-15 NOTE — Progress Notes (Signed)
Left message

## 2011-01-16 ENCOUNTER — Telehealth: Payer: Self-pay | Admitting: Cardiology

## 2011-01-16 ENCOUNTER — Telehealth: Payer: Self-pay | Admitting: *Deleted

## 2011-01-16 DIAGNOSIS — J069 Acute upper respiratory infection, unspecified: Secondary | ICD-10-CM

## 2011-01-16 NOTE — Telephone Encounter (Signed)
Message copied by Burnell Blanks on Wed Jan 16, 2011  5:26 PM ------      Message from: Cassell Clement      Created: Tue Jan 15, 2011 11:06 AM       Please report.  The LDL level is satisfactory.  Liver functions are normal.  The kidney function is normal.  The blood sugar is slightly elevated in the 114, and the triglycerides are high at 257, so watch carbohydrates and sweets carefully.  The cholesterol is okay.  The thyroid level is normal.  Continue same medicines.

## 2011-01-16 NOTE — Progress Notes (Signed)
Advised of labs 

## 2011-01-16 NOTE — Telephone Encounter (Signed)
Pt was returning your call please call her cell

## 2011-01-16 NOTE — Telephone Encounter (Signed)
Started coughing after last office visit and started Mucinex right away.  Today started getting up yellow sputum and is feeling more tired than usual.  ?Rx something?

## 2011-01-16 NOTE — Telephone Encounter (Signed)
Advised of labs 

## 2011-01-17 MED ORDER — AZITHROMYCIN 250 MG PO TABS: ORAL_TABLET | ORAL | Status: AC

## 2011-01-17 NOTE — Telephone Encounter (Signed)
Advised patient and called in to pharmacy.  Getting protime monday

## 2011-01-17 NOTE — Telephone Encounter (Signed)
Can she take a Zpak? If so, needs protime either Friday or Monday.

## 2011-01-18 ENCOUNTER — Telehealth: Payer: Self-pay | Admitting: Cardiology

## 2011-01-18 NOTE — Telephone Encounter (Signed)
Pt calling re bleeding in stool, pls advise

## 2011-01-21 ENCOUNTER — Ambulatory Visit (INDEPENDENT_AMBULATORY_CARE_PROVIDER_SITE_OTHER): Payer: Medicare Other | Admitting: *Deleted

## 2011-01-21 DIAGNOSIS — I4891 Unspecified atrial fibrillation: Secondary | ICD-10-CM

## 2011-01-21 DIAGNOSIS — I359 Nonrheumatic aortic valve disorder, unspecified: Secondary | ICD-10-CM

## 2011-01-21 DIAGNOSIS — Z954 Presence of other heart-valve replacement: Secondary | ICD-10-CM

## 2011-01-21 LAB — POCT INR: INR: 2.1

## 2011-01-21 NOTE — Telephone Encounter (Signed)
Spoke with patient and she did not receive a call back on Friday.  Apologized for that.  Stated she has not had any more bleeding or black looking stools.  She is coming for INR today and will forward to Coumadin Clinic

## 2011-01-24 ENCOUNTER — Other Ambulatory Visit: Payer: Self-pay | Admitting: Cardiology

## 2011-02-04 ENCOUNTER — Ambulatory Visit (INDEPENDENT_AMBULATORY_CARE_PROVIDER_SITE_OTHER): Payer: Medicare Other | Admitting: *Deleted

## 2011-02-04 DIAGNOSIS — I359 Nonrheumatic aortic valve disorder, unspecified: Secondary | ICD-10-CM

## 2011-02-04 DIAGNOSIS — Z954 Presence of other heart-valve replacement: Secondary | ICD-10-CM

## 2011-02-04 DIAGNOSIS — I4891 Unspecified atrial fibrillation: Secondary | ICD-10-CM

## 2011-02-04 LAB — POCT INR: INR: 2.3

## 2011-02-12 ENCOUNTER — Other Ambulatory Visit: Payer: Self-pay | Admitting: Cardiology

## 2011-02-12 MED ORDER — ALLOPURINOL 300 MG PO TABS: 300.0000 mg | ORAL_TABLET | Freq: Every day | ORAL | Status: DC

## 2011-02-14 ENCOUNTER — Ambulatory Visit (INDEPENDENT_AMBULATORY_CARE_PROVIDER_SITE_OTHER): Payer: Medicare Other | Admitting: *Deleted

## 2011-02-14 DIAGNOSIS — I4891 Unspecified atrial fibrillation: Secondary | ICD-10-CM

## 2011-02-14 DIAGNOSIS — Z954 Presence of other heart-valve replacement: Secondary | ICD-10-CM

## 2011-02-14 DIAGNOSIS — I359 Nonrheumatic aortic valve disorder, unspecified: Secondary | ICD-10-CM

## 2011-02-18 ENCOUNTER — Encounter: Payer: Medicare Other | Admitting: *Deleted

## 2011-02-25 ENCOUNTER — Encounter: Payer: Medicare Other | Admitting: *Deleted

## 2011-03-06 ENCOUNTER — Ambulatory Visit (INDEPENDENT_AMBULATORY_CARE_PROVIDER_SITE_OTHER): Payer: Medicare Other | Admitting: *Deleted

## 2011-03-06 ENCOUNTER — Telehealth: Payer: Self-pay | Admitting: Cardiology

## 2011-03-06 ENCOUNTER — Other Ambulatory Visit: Payer: Medicare Other | Admitting: *Deleted

## 2011-03-06 DIAGNOSIS — I359 Nonrheumatic aortic valve disorder, unspecified: Secondary | ICD-10-CM

## 2011-03-06 DIAGNOSIS — M545 Low back pain: Secondary | ICD-10-CM

## 2011-03-06 DIAGNOSIS — I4891 Unspecified atrial fibrillation: Secondary | ICD-10-CM

## 2011-03-06 DIAGNOSIS — Z954 Presence of other heart-valve replacement: Secondary | ICD-10-CM

## 2011-03-06 LAB — POCT INR: INR: 2.1

## 2011-03-06 NOTE — Telephone Encounter (Signed)
New Msg: Pt coming in tomorrow for protime but pt thinks she may be getting cystitis and wants to know if she can also get a urinalysis. Please return pt call to discuss further.

## 2011-03-06 NOTE — Telephone Encounter (Signed)
Agree with plan 

## 2011-03-06 NOTE — Telephone Encounter (Signed)
Rescheduled INR for today and ordered U/A.

## 2011-03-07 ENCOUNTER — Encounter: Payer: Medicare Other | Admitting: *Deleted

## 2011-03-07 LAB — URINALYSIS W MICROSCOPIC + REFLEX CULTURE
Bacteria, UA: NONE SEEN
Bilirubin Urine: NEGATIVE
Casts: NONE SEEN
Crystals: NONE SEEN
Glucose, UA: NEGATIVE mg/dL
Hgb urine dipstick: NEGATIVE
Ketones, ur: NEGATIVE mg/dL
Leukocytes, UA: NEGATIVE
Nitrite: NEGATIVE
Protein, ur: NEGATIVE mg/dL
Specific Gravity, Urine: 1.009 (ref 1.005–1.030)
Squamous Epithelial / HPF: NONE SEEN
Urobilinogen, UA: 0.2 mg/dL (ref 0.0–1.0)
pH: 6 (ref 5.0–8.0)

## 2011-03-08 ENCOUNTER — Telehealth: Payer: Self-pay | Admitting: Cardiology

## 2011-03-08 NOTE — Telephone Encounter (Signed)
Pt aware of urinalysis results. Mylo Red RN

## 2011-03-08 NOTE — Telephone Encounter (Signed)
Fu call  Pt was call about test results

## 2011-03-08 NOTE — Telephone Encounter (Signed)
FU Call: Pt returning call from [facility] regarding pt lab results. Please return pt call to discuss further.

## 2011-03-14 ENCOUNTER — Telehealth: Payer: Self-pay | Admitting: *Deleted

## 2011-03-14 NOTE — Telephone Encounter (Signed)
Message copied by Burnell Blanks on Thu Mar 14, 2011  4:11 PM ------      Message from: Cassell Clement      Created: Thu Mar 07, 2011  1:27 PM       Please report.  The urinalysis is benign

## 2011-03-14 NOTE — Telephone Encounter (Signed)
Patient never called back, left message regarding results

## 2011-03-20 ENCOUNTER — Ambulatory Visit (INDEPENDENT_AMBULATORY_CARE_PROVIDER_SITE_OTHER): Payer: Medicare Other | Admitting: *Deleted

## 2011-03-20 DIAGNOSIS — Z954 Presence of other heart-valve replacement: Secondary | ICD-10-CM

## 2011-03-20 DIAGNOSIS — I359 Nonrheumatic aortic valve disorder, unspecified: Secondary | ICD-10-CM

## 2011-03-20 DIAGNOSIS — I4891 Unspecified atrial fibrillation: Secondary | ICD-10-CM

## 2011-04-06 ENCOUNTER — Other Ambulatory Visit: Payer: Self-pay | Admitting: Cardiology

## 2011-04-10 ENCOUNTER — Other Ambulatory Visit: Payer: Self-pay | Admitting: Cardiology

## 2011-04-10 ENCOUNTER — Other Ambulatory Visit (INDEPENDENT_AMBULATORY_CARE_PROVIDER_SITE_OTHER): Payer: Medicare Other | Admitting: *Deleted

## 2011-04-10 ENCOUNTER — Encounter: Payer: Self-pay | Admitting: Cardiology

## 2011-04-10 ENCOUNTER — Ambulatory Visit (INDEPENDENT_AMBULATORY_CARE_PROVIDER_SITE_OTHER): Payer: Medicare Other | Admitting: *Deleted

## 2011-04-10 ENCOUNTER — Ambulatory Visit (INDEPENDENT_AMBULATORY_CARE_PROVIDER_SITE_OTHER): Payer: Medicare Other | Admitting: Cardiology

## 2011-04-10 VITALS — BP 138/80 | HR 74 | Ht 61.0 in | Wt 134.0 lb

## 2011-04-10 DIAGNOSIS — I359 Nonrheumatic aortic valve disorder, unspecified: Secondary | ICD-10-CM

## 2011-04-10 DIAGNOSIS — I119 Hypertensive heart disease without heart failure: Secondary | ICD-10-CM

## 2011-04-10 DIAGNOSIS — E7800 Pure hypercholesterolemia, unspecified: Secondary | ICD-10-CM

## 2011-04-10 DIAGNOSIS — I4891 Unspecified atrial fibrillation: Secondary | ICD-10-CM

## 2011-04-10 DIAGNOSIS — Z954 Presence of other heart-valve replacement: Secondary | ICD-10-CM

## 2011-04-10 DIAGNOSIS — E039 Hypothyroidism, unspecified: Secondary | ICD-10-CM

## 2011-04-10 LAB — HEPATIC FUNCTION PANEL
ALT: 23 U/L (ref 0–35)
AST: 24 U/L (ref 0–37)
Total Bilirubin: 0.6 mg/dL (ref 0.3–1.2)
Total Protein: 6.6 g/dL (ref 6.0–8.3)

## 2011-04-10 LAB — CBC WITH DIFFERENTIAL/PLATELET
Basophils Relative: 0.4 % (ref 0.0–3.0)
Eosinophils Absolute: 0 10*3/uL (ref 0.0–0.7)
Eosinophils Relative: 0.9 % (ref 0.0–5.0)
HCT: 34.9 % — ABNORMAL LOW (ref 36.0–46.0)
Hemoglobin: 11.4 g/dL — ABNORMAL LOW (ref 12.0–15.0)
Lymphs Abs: 0.7 10*3/uL (ref 0.7–4.0)
MCHC: 32.7 g/dL (ref 30.0–36.0)
MCV: 83.7 fl (ref 78.0–100.0)
Monocytes Absolute: 0.3 10*3/uL (ref 0.1–1.0)
Neutro Abs: 2.7 10*3/uL (ref 1.4–7.7)
Neutrophils Relative %: 71.6 % (ref 43.0–77.0)
RBC: 4.17 Mil/uL (ref 3.87–5.11)
WBC: 3.8 10*3/uL — ABNORMAL LOW (ref 4.5–10.5)

## 2011-04-10 LAB — BASIC METABOLIC PANEL
Calcium: 9.3 mg/dL (ref 8.4–10.5)
GFR: 86.53 mL/min (ref >60.00)
Glucose, Bld: 105 mg/dL — ABNORMAL HIGH (ref 70–99)
Potassium: 3.7 mEq/L (ref 3.5–5.1)
Sodium: 139 mEq/L (ref 135–145)

## 2011-04-10 LAB — LIPID PANEL
Cholesterol: 190 mg/dL (ref 0–200)
HDL: 58.7 mg/dL (ref >39.00)
VLDL: 42.2 mg/dL — ABNORMAL HIGH (ref 0.0–40.0)

## 2011-04-10 NOTE — Patient Instructions (Addendum)
Will obtain labs today and call you with the results (lp/bmet/hfp/tsh/cbc) Your physician recommends that you continue on your current medications as directed. Please refer to the Current Medication list given to you today. Your physician wants you to follow-up in: 3 months You will receive a reminder letter in the mail two months in advance. If you don't receive a letter, please call our office to schedule the follow-up appointment.

## 2011-04-10 NOTE — Assessment & Plan Note (Signed)
The patient has not been having any problems related to her prosthetic valve.  Her Coumadin was checked today.

## 2011-04-10 NOTE — Progress Notes (Signed)
Deborah [name] Date of Birth:  [birth year] [facility] [address] [place], [place]  [address] [phone]         Fax   [phone]  History of Present Illness: This pleasant 78 year old widowed Caucasian female is seen for a scheduled followup office visit.  He has a history of chronic atrial fibrillation.  She has a past history of aortic stenosis and has a St. Jude's mechanical aortic valve replacement in 1996.  She does not have any history of ischemic heart disease.  She had a negative nuclear stress test in March 2012.  She does have a chronically abnormal EKG with widespread ST and T wave abnormalities.  She has a past history of essential hypertension.  She had renal arteriograms in January 2012 which did not show any renal artery stenosis.  She has a history of a lot of GI issues followed by Dr. Ewing Schlein and a has been taking dicyclomine 10 mg at least once a day and sometimes up to 3 times a day as needed.  Current Outpatient Prescriptions  Medication Sig Dispense Refill  . allopurinol (ZYLOPRIM) 300 MG tablet Take 1 tablet (300 mg total) by mouth daily.  30 tablet  6  . calcium-vitamin D 500 MG tablet Take 1 tablet by mouth 2 (two) times daily.        . CELEBREX 200 MG capsule TAKE ONE CAPSULE DAILY AS NEEDED  30 each  5  . COUMADIN 5 MG tablet TAKE AS DIRECTED  30 tablet  3  . dicyclomine (BENTYL) 10 MG capsule Take 10 mg by mouth as needed.        . fexofenadine (ALLEGRA ALLERGY) 180 MG tablet Take 180 mg by mouth daily. As needed       . furosemide (LASIX) 40 MG tablet Take 1 tablet (40 mg total) by mouth daily.  30 tablet  11  . LANOXIN 0.25 MG tablet TAKE ONE-HALF TABLET DAILY  30 tablet  5  . lansoprazole (PREVACID) 30 MG capsule Take 30 mg by mouth daily.        . metoprolol tartrate (LOPRESSOR) 25 MG tablet Take 25 mg by mouth 3 (three) times daily.        . multivitamin (THERAGRAN) per tablet Take 1 tablet by mouth daily.        . nitroGLYCERIN  (NITROSTAT) 0.4 MG SL tablet ONE TABLET UNDER TONGUE AS NEEDED  25 tablet  11  . Olmesartan-Amlodipine-HCTZ (TRIBENZOR) 40-5-12.5 MG TABS 1 tablet daily   30 tablet  11  . Omega-3 Fatty Acids (FISH OIL) 1000 MG CAPS Take by mouth 2 (two) times daily.        . potassium chloride SA (K-DUR,KLOR-CON) 20 MEQ tablet Take 20 mEq by mouth daily.        . simvastatin (ZOCOR) 20 MG tablet 1/2 daily or as directed  30 tablet  11  . SYNTHROID 88 MCG tablet TAKE ONE TABLET EVERY DAY  30 tablet  6  . zolpidem (AMBIEN CR) 12.5 MG CR tablet Take 1 tablet (12.5 mg total) by mouth at bedtime as needed.  30 tablet  5    Allergies  Allergen Reactions  . Amiodarone Shortness Of Breath  . Ciprofloxacin     Rectal bleeding  . Fosamax     GI upset  . Macrodantin (Nitrofurantoin)     GI upset  . Meclizine Swelling    tongue  . Norvasc (Amlodipine Besylate)     edema  Patient Active Problem List  Diagnoses  . Hypothyroidism  . Hypertension  . Arrhythmia  . Atrial fibrillation  . Aortic valve disorders  . S/P aortic valve replacement with metallic valve  . Benign hypertensive heart disease without heart failure  . Abdominal pain    History  Smoking status  . Former Smoker  . Quit date: 04/01/1988  Smokeless tobacco  . Not on file    History  Alcohol Use     Family History  Problem Relation Age of Onset  . Dementia Mother   . Coronary artery disease Father     Review of Systems: Constitutional: no fever chills diaphoresis or fatigue or change in weight.  Head and neck: no hearing loss, no epistaxis, no photophobia or visual disturbance. Respiratory: No cough, shortness of breath or wheezing. Cardiovascular: No chest pain peripheral edema, palpitations. Gastrointestinal: No abdominal distention, no abdominal pain, no change in bowel habits hematochezia or melena. Genitourinary: No dysuria, no frequency, no urgency, no nocturia. Musculoskeletal:No arthralgias, no back pain, no gait  disturbance or myalgias. Neurological: No dizziness, no headaches, no numbness, no seizures, no syncope, no weakness, no tremors. Hematologic: No lymphadenopathy, no easy bruising. Psychiatric: No confusion, no hallucinations, no sleep disturbance.    Physical Exam: Filed Vitals:   04/10/11 0908  BP: 138/80  Pulse: 74   The general appearance reveals a well-developed well-nourished woman in no distress.The head and neck exam reveals pupils equal and reactive.  Extraocular movements are full.  There is no scleral icterus.  The mouth and pharynx are normal.  The neck is supple.  The carotids reveal no bruits.  The jugular venous pressure is normal.  The  thyroid is not enlarged.  There is no lymphadenopathy.  The chest is clear to percussion and auscultation.  There are no rales or rhonchi.  Expansion of the chest is symmetrical.  The precordium is quiet.  The first heart sound is normal.  The second heart sound is physiologically split.  There is sharp aortic valve closure sounds.  No aortic insufficiency.  There is no abnormal lift or heave.  The abdomen is soft and nontender.  The bowel sounds are normal.  The liver and spleen are not enlarged.  There are no abdominal masses.  There are no abdominal bruits.  Extremities reveal good pedal pulses.  There is no phlebitis or edema.  There is no cyanosis or clubbing.  Strength is normal and symmetrical in all extremities.  There is no lateralizing weakness.  There are no sensory deficits.  The skin is warm and dry.  There is no rash.  EKG shows atrial fibrillation with occasional PVCs and widespread ST-T wave abnormalities unchanged since 05/09/08.  Assessment / Plan:  Continue same medications.  Recheck in 3 months for office visit.  Continue Coumadin clinic as directed.

## 2011-04-10 NOTE — Assessment & Plan Note (Signed)
Patient has been clinically euthyroid.  Her weight is stable over the holidays.

## 2011-04-10 NOTE — Assessment & Plan Note (Signed)
The patient has been in chronic atrial fibrillation.  [name] not having any TIA symptoms.  No symptoms of congestive heart failure.

## 2011-04-10 NOTE — Assessment & Plan Note (Signed)
The patient has not been experiencing any headaches or dizzy spells.  Blood pressure has been remaining stable on current medication.

## 2011-04-12 ENCOUNTER — Telehealth: Payer: Self-pay | Admitting: *Deleted

## 2011-04-12 DIAGNOSIS — D649 Anemia, unspecified: Secondary | ICD-10-CM

## 2011-04-12 NOTE — Telephone Encounter (Signed)
Advised of labs and scheduled labs 

## 2011-04-12 NOTE — Telephone Encounter (Signed)
Message copied by Burnell Blanks on Fri Apr 12, 2011  4:13 PM ------      Message from: Cassell Clement      Created: Wed Apr 10, 2011  8:54 PM       LDL good.

## 2011-04-12 NOTE — Telephone Encounter (Signed)
Message copied by Burnell Blanks on Fri Apr 12, 2011  4:13 PM ------      Message from: Cassell Clement      Created: Wed Apr 10, 2011  8:54 PM       Please report.  The labs are stable.  Continue same meds.  Continue careful diet.      The Hgb is 11.4 mild anemia, could cause some fatigue.  Continue Theragran and add an OTC iron tablet daily.recheck CBC in 1 month.

## 2011-04-30 ENCOUNTER — Ambulatory Visit (INDEPENDENT_AMBULATORY_CARE_PROVIDER_SITE_OTHER): Payer: Medicare Other | Admitting: *Deleted

## 2011-04-30 ENCOUNTER — Ambulatory Visit (INDEPENDENT_AMBULATORY_CARE_PROVIDER_SITE_OTHER)
Admission: RE | Admit: 2011-04-30 | Discharge: 2011-04-30 | Disposition: A | Discharge status: Home / Self Care | Payer: Medicare Other | Source: Ambulatory Visit | Attending: Cardiology | Admitting: Cardiology

## 2011-04-30 ENCOUNTER — Ambulatory Visit (INDEPENDENT_AMBULATORY_CARE_PROVIDER_SITE_OTHER): Payer: Medicare Other | Admitting: Cardiology

## 2011-04-30 ENCOUNTER — Encounter: Payer: Self-pay | Admitting: Cardiology

## 2011-04-30 ENCOUNTER — Telehealth: Payer: Self-pay | Admitting: Cardiology

## 2011-04-30 VITALS — BP 180/88 | HR 87 | Wt 133.0 lb

## 2011-04-30 DIAGNOSIS — R05 Cough: Secondary | ICD-10-CM

## 2011-04-30 DIAGNOSIS — Z954 Presence of other heart-valve replacement: Secondary | ICD-10-CM

## 2011-04-30 DIAGNOSIS — I359 Nonrheumatic aortic valve disorder, unspecified: Secondary | ICD-10-CM

## 2011-04-30 DIAGNOSIS — I4891 Unspecified atrial fibrillation: Secondary | ICD-10-CM

## 2011-04-30 DIAGNOSIS — E039 Hypothyroidism, unspecified: Secondary | ICD-10-CM

## 2011-04-30 DIAGNOSIS — I119 Hypertensive heart disease without heart failure: Secondary | ICD-10-CM

## 2011-04-30 LAB — POCT INR: INR: 3

## 2011-04-30 MED ORDER — OSELTAMIVIR PHOSPHATE 75 MG PO CAPS: 75.0000 mg | ORAL_CAPSULE | Freq: Two times a day (BID) | ORAL | Status: AC

## 2011-04-30 MED ORDER — MOXIFLOXACIN HCL 400 MG PO TABS: 400.0000 mg | ORAL_TABLET | Freq: Every day | ORAL | Status: AC

## 2011-04-30 NOTE — Patient Instructions (Addendum)
Will obtain labs today and call you with the results (CBC) Start Tamiflu 75 mg twice a day for 5 days and Avelox 500 mg daily for 5 days--both have been sent to [facility] Come back Friday 05/03/11 at 11:30 for recheck INR  If worse on no better go to there emergency room

## 2011-04-30 NOTE — Progress Notes (Signed)
Deborah [name] Date of Birth:  18-Sep-1926 [facility] [address] [place], [place]  [address] [phone]         Fax   [phone]  History of Present Illness: This 78 year old woman is seen as a work in the office visit.  She has a complex past medical history.  She has a history of chronic atrial fibrillation.  She has a St. Jude's mechanical aortic valve replacement in 1996 for severe aortic valve disease.  She does not have any history of ischemic heart disease and she had a negative nuclear stress test in March 2012.  She does have a chronically abnormal EKG with widespread ST and T wave abnormalities.  [name] had a history of essential hypertension.  She had renal arteriograms in January 2012 which did not show any renal artery stenosis.  He was in her usual state of health until today.  This morning she had a routine dental appointment.  She took 4 amoxicillin tablets prior to the dental work as she always does for SBE prophylaxis.  The dental work was unremarkable.  He was a routine cleaning.  As she was driving home from the dentist late this morning she had a very hard chill.  She subsequently had a dry hacking cough and coughed up a small amount of blood twice with what she described as a beat red sputum.  She also noted some aching in the left flank area.  She also noted a temperature of 101.  She feels very weak.  Current Outpatient Prescriptions  Medication Sig Dispense Refill  . allopurinol (ZYLOPRIM) 300 MG tablet Take 1 tablet (300 mg total) by mouth daily.  30 tablet  6  . calcium-vitamin D 500 MG tablet Take 1 tablet by mouth 2 (two) times daily.        . CELEBREX 200 MG capsule TAKE ONE CAPSULE DAILY AS NEEDED  30 each  5  . COUMADIN 5 MG tablet TAKE AS DIRECTED  30 tablet  3  . dicyclomine (BENTYL) 10 MG capsule Take 10 mg by mouth as needed.        . fexofenadine (ALLEGRA ALLERGY) 180 MG tablet Take 180 mg by mouth daily. As needed       .  furosemide (LASIX) 40 MG tablet Take 1 tablet (40 mg total) by mouth daily.  30 tablet  11  . IRON PO Take 1 tablet by mouth daily.      Marland Kitchen LANOXIN 0.25 MG tablet TAKE ONE-HALF TABLET DAILY  30 tablet  5  . lansoprazole (PREVACID) 30 MG capsule Take 30 mg by mouth daily.        . metoprolol tartrate (LOPRESSOR) 25 MG tablet Take 25 mg by mouth 3 (three) times daily.        . multivitamin (THERAGRAN) per tablet Take 1 tablet by mouth daily.        . nitroGLYCERIN (NITROSTAT) 0.4 MG SL tablet ONE TABLET UNDER TONGUE AS NEEDED  25 tablet  11  . Olmesartan-Amlodipine-HCTZ (TRIBENZOR) 40-5-12.5 MG TABS 1 tablet daily   30 tablet  11  . Omega-3 Fatty Acids (FISH OIL) 1000 MG CAPS Take by mouth 2 (two) times daily.        . potassium chloride SA (K-DUR,KLOR-CON) 20 MEQ tablet Take 20 mEq by mouth daily.        . simvastatin (ZOCOR) 20 MG tablet 1/2 daily or as directed  30 tablet  11  . SYNTHROID 88 MCG tablet  TAKE ONE TABLET EVERY DAY  30 tablet  6  . zolpidem (AMBIEN CR) 12.5 MG CR tablet Take 1 tablet (12.5 mg total) by mouth at bedtime as needed.  30 tablet  5  . moxifloxacin (AVELOX) 400 MG tablet Take 1 tablet (400 mg total) by mouth daily.  5 tablet  0  . oseltamivir (TAMIFLU) 75 MG capsule Take 1 capsule (75 mg total) by mouth 2 (two) times daily.  10 capsule  0    Allergies  Allergen Reactions  . Amiodarone Shortness Of Breath  . Ciprofloxacin     Rectal bleeding  . Fosamax     GI upset  . Macrodantin (Nitrofurantoin)     GI upset  . Meclizine Swelling    tongue  . Norvasc (Amlodipine Besylate)     edema    Patient Active Problem List  Diagnoses  . Hypothyroidism  . Hypertension  . Arrhythmia  . Atrial fibrillation  . Aortic valve disorders  . S/P aortic valve replacement with metallic valve  . Benign hypertensive heart disease without heart failure  . Abdominal pain    History  Smoking status  . Former Smoker  . Quit date: 04/01/1988  Smokeless tobacco  . Not on  file    History  Alcohol Use     Family History  Problem Relation Age of Onset  . Dementia Mother   . Coronary artery disease Father     Review of Systems: Constitutional: no fever chills diaphoresis or fatigue or change in weight.  Head and neck: no hearing loss, no epistaxis, no photophobia or visual disturbance. Respiratory: No cough, shortness of breath or wheezing. Cardiovascular: No chest pain peripheral edema, palpitations. Gastrointestinal: No abdominal distention, no abdominal pain, no change in bowel habits hematochezia or melena. Genitourinary: No dysuria, no frequency, no urgency, no nocturia. Musculoskeletal:No arthralgias, no back pain, no gait disturbance or myalgias. Neurological: No dizziness, no headaches, no numbness, no seizures, no syncope, no weakness, no tremors. Hematologic: No lymphadenopathy, no easy bruising. Psychiatric: No confusion, no hallucinations, no sleep disturbance.    Physical Exam: Filed Vitals:   04/30/11 1510  BP: 180/88  Pulse: 87   the general appearance reveals an elderly woman who is in mild distress.  She is short of breath.  She is running a fever.Pupils equal and reactive.   Extraocular Movements are full.  There is no scleral icterus.  The mouth and pharynx are normal.  The neck is supple.  The carotids reveal no bruits.  The jugular venous pressure is normal.  The thyroid is not enlarged.  There is no lymphadenopathy.  Chest reveals some mild coarse rhonchi at the left base.  There is no pleural rub.The precordium is quiet.  There are normal metallic opening and closing aortic valve clicks.  No aortic insufficiency.  No pericardial rub.The abdomen is soft and nontender. Bowel sounds are normal. The liver and spleen are not enlarged. There Are no abdominal masses. There are no bruits.  The pedal pulses are good.  There is no phlebitis or edema.  There is no cyanosis or clubbing. Strength is normal and symmetrical in all  extremities.  There is no lateralizing weakness.  There are no sensory deficits.  Chest x-ray shows no congestive heart failure or pneumonia.    Assessment / Plan: The patient may be coming down with influenza.  I cannot rule out a bacterial lung infection associated with hemoptysis and shaking chills.  We will treat her with Tamiflu 75  mg twice a day for 5 days and we will also give her a 5 day course of Avelox 400 mg one daily.  She will take her Mucinex twice a day and she will force fluids and get bed rest and take Tylenol.  She will continue same Coumadin but she'll return on February 1 for repeat protime.

## 2011-04-30 NOTE — Assessment & Plan Note (Signed)
Her blood pressure is high today which she attributes to running around.  Prior to coming here we had her go by and get a chest x-ray and then she has also been to the Coumadin clinic where her INR was 3.0

## 2011-04-30 NOTE — Telephone Encounter (Signed)
Pt coughing up blood, fever 101, went to dentist today, took 4 500 PCN

## 2011-04-30 NOTE — Assessment & Plan Note (Signed)
She has not had any TIA or stroke symptoms. 

## 2011-04-30 NOTE — Telephone Encounter (Signed)
Scheduled patient for xray and office visit today

## 2011-04-30 NOTE — Assessment & Plan Note (Signed)
Clinically euthyroid 

## 2011-05-01 ENCOUNTER — Telehealth: Payer: Self-pay | Admitting: *Deleted

## 2011-05-01 LAB — CBC WITH DIFFERENTIAL/PLATELET
Basophils Absolute: 0 10*3/uL (ref 0.0–0.1)
Eosinophils Absolute: 0 10*3/uL (ref 0.0–0.7)
Hemoglobin: 12.6 g/dL (ref 12.0–15.0)
Lymphocytes Relative: 3 % — ABNORMAL LOW (ref 12.0–46.0)
MCHC: 32.6 g/dL (ref 30.0–36.0)
Monocytes Relative: 5.6 % (ref 3.0–12.0)
Neutro Abs: 11.4 10*3/uL — ABNORMAL HIGH (ref 1.4–7.7)
Neutrophils Relative %: 91.2 % — ABNORMAL HIGH (ref 43.0–77.0)
Platelets: 144 10*3/uL — ABNORMAL LOW (ref 150.0–400.0)
RDW: 23 % — ABNORMAL HIGH (ref 11.5–14.6)

## 2011-05-01 NOTE — Telephone Encounter (Signed)
Advised of chest xray and she stated she was feeling much better

## 2011-05-01 NOTE — Telephone Encounter (Signed)
Message copied by Burnell Blanks on Wed May 01, 2011  2:49 PM ------      Message from: Cassell Clement      Created: Wed May 01, 2011  2:31 PM       Her white blood cell count is elevated with a left shift suggesting bacterial infection.  Hopefully she is feeling better with the Avelox.

## 2011-05-01 NOTE — Telephone Encounter (Signed)
Advised of labs 

## 2011-05-01 NOTE — Telephone Encounter (Signed)
Message copied by Burnell Blanks on Wed May 01, 2011  2:50 PM ------      Message from: Cassell Clement      Created: Tue Apr 30, 2011  5:38 PM       Please report.  The radiologist felt that the chest x-ray was negative for pneumonia at this point.

## 2011-05-03 ENCOUNTER — Ambulatory Visit (INDEPENDENT_AMBULATORY_CARE_PROVIDER_SITE_OTHER): Payer: Medicare Other | Admitting: *Deleted

## 2011-05-03 DIAGNOSIS — I359 Nonrheumatic aortic valve disorder, unspecified: Secondary | ICD-10-CM

## 2011-05-03 DIAGNOSIS — Z954 Presence of other heart-valve replacement: Secondary | ICD-10-CM

## 2011-05-03 DIAGNOSIS — I4891 Unspecified atrial fibrillation: Secondary | ICD-10-CM

## 2011-05-03 LAB — POCT INR: INR: 2.1

## 2011-05-06 ENCOUNTER — Telehealth: Payer: Self-pay | Admitting: Cardiology

## 2011-05-06 NOTE — Telephone Encounter (Signed)
Advised patient, call back if no better or worse 

## 2011-05-06 NOTE — Telephone Encounter (Signed)
Would try Tylenol for pain relief.

## 2011-05-06 NOTE — Telephone Encounter (Signed)
Still coughing up colored sputum.  Has had sharp pains that move around and thinks it may be pleurisy.  Has not tried anything for that.  Will forward to  Dr. Patty Sermons for recommendation

## 2011-05-06 NOTE — Telephone Encounter (Signed)
Pt calling re avelox, not helping yet

## 2011-05-08 ENCOUNTER — Ambulatory Visit (INDEPENDENT_AMBULATORY_CARE_PROVIDER_SITE_OTHER): Payer: Medicare Other | Admitting: Pharmacist

## 2011-05-08 ENCOUNTER — Ambulatory Visit (INDEPENDENT_AMBULATORY_CARE_PROVIDER_SITE_OTHER): Payer: Medicare Other | Admitting: *Deleted

## 2011-05-08 DIAGNOSIS — I4891 Unspecified atrial fibrillation: Secondary | ICD-10-CM

## 2011-05-08 DIAGNOSIS — Z954 Presence of other heart-valve replacement: Secondary | ICD-10-CM

## 2011-05-08 DIAGNOSIS — I359 Nonrheumatic aortic valve disorder, unspecified: Secondary | ICD-10-CM

## 2011-05-08 LAB — CBC WITH DIFFERENTIAL/PLATELET
Basophils Absolute: 0 10*3/uL (ref 0.0–0.1)
Eosinophils Absolute: 0.1 10*3/uL (ref 0.0–0.7)
HCT: 37.1 % (ref 36.0–46.0)
Hemoglobin: 12.1 g/dL (ref 12.0–15.0)
Lymphocytes Relative: 24 % (ref 12.0–46.0)
Lymphs Abs: 1.2 10*3/uL (ref 0.7–4.0)
MCHC: 32.5 g/dL (ref 30.0–36.0)
Neutro Abs: 3.4 10*3/uL (ref 1.4–7.7)
RDW: 23 % — ABNORMAL HIGH (ref 11.5–14.6)

## 2011-05-10 ENCOUNTER — Telehealth: Payer: Self-pay | Admitting: *Deleted

## 2011-05-10 ENCOUNTER — Telehealth: Payer: Self-pay | Admitting: Cardiology

## 2011-05-10 NOTE — Telephone Encounter (Signed)
Advised of labs 

## 2011-05-10 NOTE — Telephone Encounter (Signed)
Message copied by Burnell Blanks on Fri May 10, 2011 11:58 AM ------      Message from: Cassell Clement      Created: Wed May 08, 2011  2:09 PM       Her WBC is back to normal now.

## 2011-05-10 NOTE — Telephone Encounter (Signed)
Message copied by Burnell Blanks on Fri May 10, 2011 11:59 AM ------      Message from: Cassell Clement      Created: Wed May 08, 2011  2:09 PM       Her WBC is back to normal now.

## 2011-05-10 NOTE — Telephone Encounter (Signed)
Patient actually returning call on labs, advised

## 2011-05-10 NOTE — Telephone Encounter (Signed)
Follow-up:     Patient called about her cough. Please call back.

## 2011-06-05 ENCOUNTER — Ambulatory Visit (INDEPENDENT_AMBULATORY_CARE_PROVIDER_SITE_OTHER): Payer: Medicare Other | Admitting: Pharmacist

## 2011-06-05 DIAGNOSIS — Z954 Presence of other heart-valve replacement: Secondary | ICD-10-CM

## 2011-06-05 DIAGNOSIS — I359 Nonrheumatic aortic valve disorder, unspecified: Secondary | ICD-10-CM

## 2011-06-05 DIAGNOSIS — I4891 Unspecified atrial fibrillation: Secondary | ICD-10-CM

## 2011-06-10 ENCOUNTER — Other Ambulatory Visit: Payer: Self-pay | Admitting: Cardiology

## 2011-06-10 DIAGNOSIS — I4891 Unspecified atrial fibrillation: Secondary | ICD-10-CM

## 2011-06-10 DIAGNOSIS — G4700 Insomnia, unspecified: Secondary | ICD-10-CM

## 2011-06-10 DIAGNOSIS — I119 Hypertensive heart disease without heart failure: Secondary | ICD-10-CM

## 2011-06-10 NOTE — Telephone Encounter (Signed)
Refills on  Ambien,lanoxin,metoprolol,potassium

## 2011-06-24 ENCOUNTER — Other Ambulatory Visit: Payer: Self-pay | Admitting: Cardiology

## 2011-06-24 NOTE — Telephone Encounter (Signed)
Refilled synthroid

## 2011-06-26 ENCOUNTER — Ambulatory Visit (INDEPENDENT_AMBULATORY_CARE_PROVIDER_SITE_OTHER): Payer: Medicare Other | Admitting: Pharmacist

## 2011-06-26 ENCOUNTER — Other Ambulatory Visit: Payer: Self-pay | Admitting: *Deleted

## 2011-06-26 DIAGNOSIS — I4891 Unspecified atrial fibrillation: Secondary | ICD-10-CM

## 2011-06-26 DIAGNOSIS — I359 Nonrheumatic aortic valve disorder, unspecified: Secondary | ICD-10-CM

## 2011-06-26 DIAGNOSIS — Z954 Presence of other heart-valve replacement: Secondary | ICD-10-CM

## 2011-06-26 MED ORDER — AZITHROMYCIN 1 G PO PACK: 1.0000 | PACK | Freq: Once | ORAL | Status: AC

## 2011-07-01 DIAGNOSIS — J189 Pneumonia, unspecified organism: Secondary | ICD-10-CM

## 2011-07-01 HISTORY — DX: Pneumonia, unspecified organism: J18.9

## 2011-07-04 ENCOUNTER — Ambulatory Visit (INDEPENDENT_AMBULATORY_CARE_PROVIDER_SITE_OTHER): Payer: Medicare Other | Admitting: Cardiology

## 2011-07-04 ENCOUNTER — Encounter: Payer: Self-pay | Admitting: Cardiology

## 2011-07-04 VITALS — BP 120/78 | HR 78 | Ht 60.0 in | Wt 132.0 lb

## 2011-07-04 DIAGNOSIS — J3489 Other specified disorders of nose and nasal sinuses: Secondary | ICD-10-CM

## 2011-07-04 DIAGNOSIS — Z954 Presence of other heart-valve replacement: Secondary | ICD-10-CM

## 2011-07-04 DIAGNOSIS — I4891 Unspecified atrial fibrillation: Secondary | ICD-10-CM

## 2011-07-04 DIAGNOSIS — E039 Hypothyroidism, unspecified: Secondary | ICD-10-CM

## 2011-07-04 DIAGNOSIS — R109 Unspecified abdominal pain: Secondary | ICD-10-CM

## 2011-07-04 DIAGNOSIS — I119 Hypertensive heart disease without heart failure: Secondary | ICD-10-CM

## 2011-07-04 DIAGNOSIS — E7800 Pure hypercholesterolemia, unspecified: Secondary | ICD-10-CM

## 2011-07-04 NOTE — Progress Notes (Signed)
Deborah [name] Date of Birth:  1926-10-21 [facility] [address] [place], [place]  [address] [phone]         Fax   [phone]  History of Present Illness: This pleasant 78 year old woman is seen for a three-month followup office visit.  She has a complex past medical history.  He has a history of chronic atrial fibrillation.  She had a St. Jude's mechanical aortic valve replacement in 1996 for severe aortic valve disease.  She does not have any history of ischemic heart disease.  She had a negative nuclear stress test in March 2012.  Her EKG is chronically abnormal with widespread ST and T wave abnormalities.  She had renal arteriograms in January 2012 which did not show any renal artery stenosis.  Does have a history of essential hypertension.  Since last visit she's had no new cardiac symptoms.  Current Outpatient Prescriptions  Medication Sig Dispense Refill  . allopurinol (ZYLOPRIM) 300 MG tablet Take 1 tablet (300 mg total) by mouth daily.  30 tablet  6  . azithromycin (ZITHROMAX) 1 G powder Take 1 packet by mouth once.  1 each  0  . calcium-vitamin D 500 MG tablet Take 1 tablet by mouth 2 (two) times daily.        . CELEBREX 200 MG capsule TAKE ONE CAPSULE DAILY AS NEEDED  30 each  5  . COUMADIN 5 MG tablet TAKE AS DIRECTED  30 tablet  3  . dicyclomine (BENTYL) 10 MG capsule Take 10 mg by mouth as needed.        . fexofenadine (ALLEGRA ALLERGY) 180 MG tablet Take 180 mg by mouth daily. As needed       . furosemide (LASIX) 40 MG tablet Take 1 tablet (40 mg total) by mouth daily.  30 tablet  11  . IRON PO Take 1 tablet by mouth daily.      Marland Kitchen LANOXIN 0.25 MG tablet TAKE ONE-HALF TABLET DAILY  30 tablet  11  . lansoprazole (PREVACID) 30 MG capsule Take 30 mg by mouth daily.        . metoprolol tartrate (LOPRESSOR) 25 MG tablet TAKE ONE TABLET THREE                   TIMES DAILY  90 tablet  11  . multivitamin (THERAGRAN) per tablet Take 1 tablet by mouth  daily.        . nitroGLYCERIN (NITROSTAT) 0.4 MG SL tablet ONE TABLET UNDER TONGUE AS NEEDED  25 tablet  11  . Olmesartan-Amlodipine-HCTZ (TRIBENZOR) 40-5-12.5 MG TABS 1 tablet daily   30 tablet  11  . Omega-3 Fatty Acids (FISH OIL) 1000 MG CAPS Take by mouth 2 (two) times daily.        . potassium chloride SA (K-DUR,KLOR-CON) 20 MEQ tablet TAKE ONE TABLET TWICE DAILY  60 tablet  11  . simvastatin (ZOCOR) 20 MG tablet 1/2 daily or as directed  30 tablet  11  . SYNTHROID 88 MCG tablet TAKE ONE TABLET EVERY DAY  30 tablet  11  . zolpidem (AMBIEN CR) 12.5 MG CR tablet TAKE ONE TABLET AT BEDTIME                                                  Generic for AMBIEN C  30 tablet  4    Allergies  Allergen Reactions  . Amiodarone Shortness Of Breath  . Ciprofloxacin     Rectal bleeding  . Fosamax     GI upset  . Macrodantin (Nitrofurantoin)     GI upset  . Meclizine Swelling    tongue  . Norvasc (Amlodipine Besylate)     edema    Patient Active Problem List  Diagnoses  . Hypothyroidism  . Hypertension  . Arrhythmia  . Atrial fibrillation  . Aortic valve disorders  . S/P aortic valve replacement with metallic valve  . Benign hypertensive heart disease without heart failure  . Abdominal pain    History  Smoking status  . Former Smoker  . Quit date: 04/01/1988  Smokeless tobacco  . Not on file    History  Alcohol Use     Family History  Problem Relation Age of Onset  . Dementia Mother   . Coronary artery disease Father     Review of Systems: Constitutional: no fever chills diaphoresis or fatigue or change in weight.  Head and neck: no hearing loss, no epistaxis, no photophobia or visual disturbance. Respiratory: No cough, shortness of breath or wheezing. Cardiovascular: No chest pain peripheral edema, palpitations. Gastrointestinal: No abdominal distention, no abdominal pain, no change in bowel habits hematochezia or melena. Genitourinary: No dysuria, no frequency, no  urgency, no nocturia. Musculoskeletal:No arthralgias, no back pain, no gait disturbance or myalgias. Neurological: No dizziness, no headaches, no numbness, no seizures, no syncope, no weakness, no tremors. Hematologic: No lymphadenopathy, no easy bruising. Psychiatric: No confusion, no hallucinations, no sleep disturbance.    Physical Exam: Filed Vitals:   07/04/11 1145  BP: 120/78  Pulse: 78   the general appearance reveals a well-developed well-nourished woman in no distress.Pupils equal and reactive.   Extraocular Movements are full.  There is no scleral icterus.  The mouth and pharynx are normal.  The neck is supple.  The carotids reveal no bruits.  The jugular venous pressure is normal.  The thyroid is not enlarged.  There is no lymphadenopathy.  The chest is clear to percussion and auscultation. There are no rales or rhonchi. Expansion of the chest is symmetrical.  The heart reveals good opening and closing aortic valve metallic clicks and there is no aortic insufficiency murmur.  No gallop or rub. The abdomen is soft and nontender. Bowel sounds are normal. The liver and spleen are not enlarged. There Are no abdominal masses. There are no bruits.  The pedal pulses are good.  There is no phlebitis or edema.  There is no cyanosis or clubbing. Strength is normal and symmetrical in all extremities.  There is no lateralizing weakness.  There are no sensory deficits.  The skin is warm and dry.  There is no rash.    Assessment / Plan:  Continue same medication.  Recheck in 3 months for followup office visit and fasting lipid panel hepatic function panel basal metabolic panel CBC and TSH.

## 2011-07-04 NOTE — Assessment & Plan Note (Signed)
The patient has not been experiencing any symptoms of congestive heart failure 

## 2011-07-04 NOTE — Assessment & Plan Note (Signed)
The patient is in chronic atrial fibrillation.  She is on long-term Coumadin.  He is not having any complications from the Coumadin.

## 2011-07-04 NOTE — Assessment & Plan Note (Signed)
The patient has a history of chronic intestinal spasms and is followed by gastroenterology.  She is on dicyclomine 2 or 3 times a day.  It is effective in causing the cramping abdominal pain to subside.  The patient has a good appetite and her weight is stable

## 2011-07-04 NOTE — Assessment & Plan Note (Signed)
The patient's blood pressure has been stable on current therapy.  She has not been expressing any headaches or dizziness.  She has had a recent sinus infection and finished a round of Zithromax recently.

## 2011-07-04 NOTE — Patient Instructions (Signed)
Your physician recommends that you schedule a follow-up appointment in: 3 months with Dr. Patty Sermons.  Your physician recommends that you return for lab work in: 3 months, 2-3 days prior to your follow up appointment for a lipid panel, liver function, BMET, CBC & TSH.

## 2011-07-16 ENCOUNTER — Telehealth: Payer: Self-pay | Admitting: Cardiology

## 2011-07-16 MED ORDER — AZITHROMYCIN 250 MG PO TABS: ORAL_TABLET | ORAL | Status: DC

## 2011-07-16 NOTE — Telephone Encounter (Signed)
Continue Mucinex and add Z-Pak

## 2011-07-16 NOTE — Telephone Encounter (Signed)
Advised patient

## 2011-07-16 NOTE — Telephone Encounter (Signed)
Thinks she has bronchitis, using Mucinex.  Productive cough with yellow sputum.  Doesn't feel well.  Has had chills but not sure about a fever. Uses [name]. Will forward to  Dr. Patty Sermons for review

## 2011-07-16 NOTE — Telephone Encounter (Signed)
Pt calling re having poss allergy infection,maybe bronchitis, she just had it last month, pls call

## 2011-07-19 ENCOUNTER — Telehealth: Payer: Self-pay | Admitting: Cardiology

## 2011-07-19 ENCOUNTER — Other Ambulatory Visit (INDEPENDENT_AMBULATORY_CARE_PROVIDER_SITE_OTHER): Payer: Medicare Other

## 2011-07-19 ENCOUNTER — Ambulatory Visit (INDEPENDENT_AMBULATORY_CARE_PROVIDER_SITE_OTHER): Payer: Medicare Other | Admitting: Pharmacist

## 2011-07-19 ENCOUNTER — Ambulatory Visit (INDEPENDENT_AMBULATORY_CARE_PROVIDER_SITE_OTHER): Payer: Medicare Other | Admitting: Cardiology

## 2011-07-19 ENCOUNTER — Encounter: Payer: Self-pay | Admitting: Cardiology

## 2011-07-19 ENCOUNTER — Ambulatory Visit (INDEPENDENT_AMBULATORY_CARE_PROVIDER_SITE_OTHER)
Admission: RE | Admit: 2011-07-19 | Discharge: 2011-07-19 | Disposition: A | Discharge status: Home / Self Care | Payer: Medicare Other | Source: Ambulatory Visit | Attending: Cardiology | Admitting: Cardiology

## 2011-07-19 VITALS — BP 168/84 | HR 80 | Ht 60.0 in | Wt 130.0 lb

## 2011-07-19 DIAGNOSIS — I4891 Unspecified atrial fibrillation: Secondary | ICD-10-CM

## 2011-07-19 DIAGNOSIS — I10 Essential (primary) hypertension: Secondary | ICD-10-CM

## 2011-07-19 DIAGNOSIS — R05 Cough: Secondary | ICD-10-CM | POA: Insufficient documentation

## 2011-07-19 DIAGNOSIS — J209 Acute bronchitis, unspecified: Secondary | ICD-10-CM

## 2011-07-19 DIAGNOSIS — Z7901 Long term (current) use of anticoagulants: Secondary | ICD-10-CM

## 2011-07-19 DIAGNOSIS — I359 Nonrheumatic aortic valve disorder, unspecified: Secondary | ICD-10-CM

## 2011-07-19 LAB — CBC WITH DIFFERENTIAL/PLATELET
Basophils Absolute: 0 10*3/uL (ref 0.0–0.1)
Eosinophils Absolute: 0 10*3/uL (ref 0.0–0.7)
Lymphocytes Relative: 7.2 % — ABNORMAL LOW (ref 12.0–46.0)
MCHC: 33 g/dL (ref 30.0–36.0)
MCV: 93 fl (ref 78.0–100.0)
Monocytes Absolute: 0.5 10*3/uL (ref 0.1–1.0)
Neutrophils Relative %: 85.6 % — ABNORMAL HIGH (ref 43.0–77.0)
Platelets: 142 10*3/uL — ABNORMAL LOW (ref 150.0–400.0)
RDW: 15.8 % — ABNORMAL HIGH (ref 11.5–14.6)

## 2011-07-19 LAB — BASIC METABOLIC PANEL
CO2: 32 mEq/L (ref 19–32)
Calcium: 9.3 mg/dL (ref 8.4–10.5)
Glucose, Bld: 120 mg/dL — ABNORMAL HIGH (ref 70–99)
Sodium: 128 mEq/L — ABNORMAL LOW (ref 135–145)

## 2011-07-19 MED ORDER — HYDROCOD POLST-CHLORPHEN POLST 10-8 MG/5ML PO LQCR: 5.0000 mL | Freq: Two times a day (BID) | ORAL | Status: DC

## 2011-07-19 MED ORDER — MOXIFLOXACIN HCL 400 MG PO TABS: 400.0000 mg | ORAL_TABLET | Freq: Every day | ORAL | Status: DC

## 2011-07-19 NOTE — Assessment & Plan Note (Signed)
Blood pressure has been stable on current therapy.  She does not appear to be dehydrated.

## 2011-07-19 NOTE — Assessment & Plan Note (Signed)
She is in chronic atrial fibrillation.  Despite her poor appetite her INR today was low 1.6 and she was given instructions at the Coumadin clinic to take extra Coumadin over the weekend.

## 2011-07-19 NOTE — Telephone Encounter (Signed)
Unable to sleep at night for 2 nights, nausea, and unsure if medication is not agreeing with her.  Does think she has fever.  Cant eat, weak, and feels bad.  Denies black stools.  Does have some wheezing. Will forward to  Dr. Patty Sermons for review

## 2011-07-19 NOTE — Telephone Encounter (Signed)
Pt feels she is having problems from the z-pack she was put on

## 2011-07-19 NOTE — Assessment & Plan Note (Signed)
The patient has had a severe slightly productive cough.  [name] not had hemoptysis.  [name] had yellowish sputum in small amounts.  She has not responded to Zithromax.  We are going to switch her to Avelox 400 mg one daily #7.  Her chest x-ray today shows a very slight infiltrate at the left base.  I looked at the chest x-ray myself.  She is to continue Mucinex 600 mg twice a day and drink plenty of fluids.  For suppression of hacking cough she may use Tussionex 5 cc every 12 hours when necessary

## 2011-07-19 NOTE — Progress Notes (Signed)
Deborah [name] Date of Birth:  1926/12/14 [facility] [address] [place], [place]  [address] [phone]         Fax   [phone]  History of Present Illness: This pleasant 78 year old woman is seen as a work in the office visit.  She has a history of chronic atrial fibrillation on Coumadin.  She has a St. Jude's mechanical aortic valve replacement in 1996 for severe aortic valve disease.  She does not have any history of ischemic heart disease.  She had a normal nuclear stress test in March 2012.  She has chronically abnormal ST and T wave abnormalities on her resting EKG.  She has a history of high blood pressure and she had renal arteriograms in January 2012 which did not show any renal artery stenosis. She comes in today with a several day history of cough.  Several days ago we called in a Z-Pak which has not helped her.  [name] had some low-grade fever.  She is coughing up some yellowish sputum.  Her cough has been very hard and painful and her chest is sore from coughing.  She has not been sleeping well.  [name] had a poor appetite and she has had a slight amount of diarrhea.  Current Outpatient Prescriptions  Medication Sig Dispense Refill  . allopurinol (ZYLOPRIM) 300 MG tablet Take 1 tablet (300 mg total) by mouth daily.  30 tablet  6  . calcium-vitamin D 500 MG tablet Take 1 tablet by mouth 2 (two) times daily.        . CELEBREX 200 MG capsule TAKE ONE CAPSULE DAILY AS NEEDED  30 each  5  . COUMADIN 5 MG tablet TAKE AS DIRECTED  30 tablet  3  . dicyclomine (BENTYL) 10 MG capsule Take 10 mg by mouth as needed.        . fexofenadine (ALLEGRA ALLERGY) 180 MG tablet Take 180 mg by mouth daily. As needed       . furosemide (LASIX) 40 MG tablet Take 1 tablet (40 mg total) by mouth daily.  30 tablet  11  . IRON PO Take 1 tablet by mouth daily.      Marland Kitchen LANOXIN 0.25 MG tablet TAKE ONE-HALF TABLET DAILY  30 tablet  11  . lansoprazole (PREVACID) 30 MG capsule Take 30  mg by mouth daily.        . metoprolol tartrate (LOPRESSOR) 25 MG tablet TAKE ONE TABLET THREE                   TIMES DAILY  90 tablet  11  . multivitamin (THERAGRAN) per tablet Take 1 tablet by mouth daily.        . nitroGLYCERIN (NITROSTAT) 0.4 MG SL tablet ONE TABLET UNDER TONGUE AS NEEDED  25 tablet  11  . Olmesartan-Amlodipine-HCTZ (TRIBENZOR) 40-5-12.5 MG TABS 1 tablet daily   30 tablet  11  . Omega-3 Fatty Acids (FISH OIL) 1000 MG CAPS Take by mouth 2 (two) times daily.        . potassium chloride SA (K-DUR,KLOR-CON) 20 MEQ tablet TAKE ONE TABLET TWICE DAILY  60 tablet  11  . simvastatin (ZOCOR) 20 MG tablet 1/2 daily or as directed  30 tablet  11  . SYNTHROID 88 MCG tablet TAKE ONE TABLET EVERY DAY  30 tablet  11  . zolpidem (AMBIEN CR) 12.5 MG CR tablet TAKE ONE TABLET AT BEDTIME  Generic for AMBIEN C  30 tablet  4    Allergies  Allergen Reactions  . Amiodarone Shortness Of Breath  . Ciprofloxacin     Rectal bleeding  . Fosamax     GI upset  . Macrodantin (Nitrofurantoin)     GI upset  . Meclizine Swelling    tongue  . Norvasc (Amlodipine Besylate)     edema    Patient Active Problem List  Diagnoses  . Hypothyroidism  . Hypertension  . Arrhythmia  . Atrial fibrillation  . Aortic valve disorders  . S/P aortic valve replacement with metallic valve  . Benign hypertensive heart disease without heart failure  . Abdominal pain    History  Smoking status  . Former Smoker  . Quit date: 04/01/1988  Smokeless tobacco  . Not on file    History  Alcohol Use     Family History  Problem Relation Age of Onset  . Dementia Mother   . Coronary artery disease Father     Review of Systems: Constitutional: no fever chills diaphoresis or fatigue or change in weight.  Head and neck: no hearing loss, no epistaxis, no photophobia or visual disturbance. Respiratory: No cough, shortness of breath or wheezing. Cardiovascular:  No chest pain peripheral edema, palpitations. Gastrointestinal: No abdominal distention, no abdominal pain, no change in bowel habits hematochezia or melena. Genitourinary: No dysuria, no frequency, no urgency, no nocturia. Musculoskeletal:No arthralgias, no back pain, no gait disturbance or myalgias. Neurological: No dizziness, no headaches, no numbness, no seizures, no syncope, no weakness, no tremors. Hematologic: No lymphadenopathy, no easy bruising. Psychiatric: No confusion, no hallucinations, no sleep disturbance.    Physical Exam: Filed Vitals:   07/19/11 1204  BP: 168/84  Pulse: 80   the general appearance reveals a well-developed woman in no distress.  Skin is warm and dry.Pupils equal and reactive.   Extraocular Movements are full.  There is no scleral icterus.  The mouth and pharynx are normal.  The neck is supple.  The carotids reveal no bruits.  The jugular venous pressure is normal.  The thyroid is not enlarged.  There is no lymphadenopathy.  Chest reveals mild rhonchi at the left base. Heart reveals good opening and closing aortic valve clicks with no aortic insufficiency. The abdomen is soft and nontender. Bowel sounds are normal. The liver and spleen are not enlarged. There Are no abdominal masses. There are no bruits.  The pedal pulses are good.  There is no phlebitis or edema.  There is no cyanosis or clubbing. Strength is normal and symmetrical in all extremities.  There is no lateralizing weakness.  There are no sensory deficits.  The skin is warm and dry.  There is no rash.    Assessment / Plan:  Treat her to respiratory infection consistent with bronchitis or early pneumonitis at the left base with Avelox 400 mg one daily.

## 2011-07-19 NOTE — Telephone Encounter (Signed)
Discussed with  Dr. Patty Sermons and will get xray, labs, and see for office visit

## 2011-07-19 NOTE — Patient Instructions (Addendum)
Stop the Zpak and start Avelox 400 mg daily  Start Tussionex 5 cc every 12 hours as needed for cough  Use Tylenol as needed for fever and continue Mucinex  Go to the emergency department if worse  Check coumadin next Tuesday April 23

## 2011-07-20 ENCOUNTER — Encounter (HOSPITAL_COMMUNITY): Payer: Self-pay | Admitting: Emergency Medicine

## 2011-07-20 ENCOUNTER — Inpatient Hospital Stay (HOSPITAL_COMMUNITY)
Admission: EM | Admit: 2011-07-20 | Discharge: 2011-07-23 | DRG: 202 | Disposition: A | Discharge status: Skilled Nursing Facility | Payer: Medicare Other | Attending: Internal Medicine | Admitting: Internal Medicine

## 2011-07-20 ENCOUNTER — Emergency Department (HOSPITAL_COMMUNITY): Payer: Medicare Other

## 2011-07-20 DIAGNOSIS — G4700 Insomnia, unspecified: Secondary | ICD-10-CM

## 2011-07-20 DIAGNOSIS — J45909 Unspecified asthma, uncomplicated: Secondary | ICD-10-CM | POA: Diagnosis present

## 2011-07-20 DIAGNOSIS — R531 Weakness: Secondary | ICD-10-CM

## 2011-07-20 DIAGNOSIS — I4891 Unspecified atrial fibrillation: Secondary | ICD-10-CM | POA: Diagnosis present

## 2011-07-20 DIAGNOSIS — E7800 Pure hypercholesterolemia, unspecified: Secondary | ICD-10-CM

## 2011-07-20 DIAGNOSIS — I119 Hypertensive heart disease without heart failure: Secondary | ICD-10-CM

## 2011-07-20 DIAGNOSIS — Z7901 Long term (current) use of anticoagulants: Secondary | ICD-10-CM

## 2011-07-20 DIAGNOSIS — E871 Hypo-osmolality and hyponatremia: Secondary | ICD-10-CM | POA: Diagnosis present

## 2011-07-20 DIAGNOSIS — R0902 Hypoxemia: Secondary | ICD-10-CM | POA: Diagnosis present

## 2011-07-20 DIAGNOSIS — J40 Bronchitis, not specified as acute or chronic: Secondary | ICD-10-CM | POA: Diagnosis present

## 2011-07-20 DIAGNOSIS — I472 Ventricular tachycardia, unspecified: Secondary | ICD-10-CM | POA: Diagnosis present

## 2011-07-20 DIAGNOSIS — I4729 Other ventricular tachycardia: Secondary | ICD-10-CM | POA: Diagnosis present

## 2011-07-20 DIAGNOSIS — Z66 Do not resuscitate: Secondary | ICD-10-CM | POA: Diagnosis not present

## 2011-07-20 DIAGNOSIS — R197 Diarrhea, unspecified: Secondary | ICD-10-CM

## 2011-07-20 DIAGNOSIS — J189 Pneumonia, unspecified organism: Secondary | ICD-10-CM

## 2011-07-20 DIAGNOSIS — Z954 Presence of other heart-valve replacement: Secondary | ICD-10-CM

## 2011-07-20 DIAGNOSIS — E039 Hypothyroidism, unspecified: Secondary | ICD-10-CM | POA: Diagnosis present

## 2011-07-20 DIAGNOSIS — E876 Hypokalemia: Secondary | ICD-10-CM | POA: Diagnosis present

## 2011-07-20 DIAGNOSIS — I10 Essential (primary) hypertension: Secondary | ICD-10-CM | POA: Diagnosis present

## 2011-07-20 DIAGNOSIS — J209 Acute bronchitis, unspecified: Principal | ICD-10-CM | POA: Diagnosis present

## 2011-07-20 LAB — CBC
HCT: 40.2 % (ref 36.0–46.0)
HCT: 40.5 % (ref 36.0–46.0)
Hemoglobin: 13.9 g/dL (ref 12.0–15.0)
MCHC: 34.6 g/dL (ref 30.0–36.0)
MCV: 89.2 fL (ref 78.0–100.0)
RBC: 4.54 MIL/uL (ref 3.87–5.11)
RDW: 14.5 % (ref 11.5–15.5)
WBC: 8.3 10*3/uL (ref 4.0–10.5)
WBC: 9.6 10*3/uL (ref 4.0–10.5)

## 2011-07-20 LAB — COMPREHENSIVE METABOLIC PANEL
ALT: 27 U/L (ref 0–35)
ALT: 25 U/L (ref 0–35)
AST: 30 U/L (ref 0–37)
AST: 29 U/L (ref 0–37)
Albumin: 3.5 g/dL (ref 3.5–5.2)
Albumin: 3.7 g/dL (ref 3.5–5.2)
Alkaline Phosphatase: 88 U/L (ref 39–117)
CO2: 28 mEq/L (ref 19–32)
CO2: 25 mEq/L (ref 19–32)
Calcium: 9.2 mg/dL (ref 8.4–10.5)
Chloride: 76 mEq/L — ABNORMAL LOW (ref 96–112)
Creatinine, Ser: 0.44 mg/dL — ABNORMAL LOW (ref 0.50–1.10)
GFR calc non Af Amer: >90 mL/min (ref >90)
GFR calc non Af Amer: 89 mL/min — ABNORMAL LOW (ref >90)
Potassium: 2.7 mEq/L — CL (ref 3.5–5.1)
Sodium: 116 mEq/L — CL (ref 135–145)
Sodium: 121 mEq/L — ABNORMAL LOW (ref 135–145)
Total Bilirubin: 0.8 mg/dL (ref 0.3–1.2)

## 2011-07-20 LAB — URINALYSIS, ROUTINE W REFLEX MICROSCOPIC
Bilirubin Urine: NEGATIVE
Glucose, UA: NEGATIVE mg/dL
Ketones, ur: 15 mg/dL — AB
Leukocytes, UA: NEGATIVE
Protein, ur: >300 mg/dL — AB
pH: 7 (ref 5.0–8.0)

## 2011-07-20 LAB — DIFFERENTIAL
Basophils Absolute: 0 10*3/uL (ref 0.0–0.1)
Basophils Relative: 0 % (ref 0–1)
Eosinophils Relative: 0 % (ref 0–5)
Lymphocytes Relative: 5 % — ABNORMAL LOW (ref 12–46)
Lymphocytes Relative: 7 % — ABNORMAL LOW (ref 12–46)
Lymphs Abs: 0.7 10*3/uL (ref 0.7–4.0)
Monocytes Absolute: 0.7 10*3/uL (ref 0.1–1.0)
Monocytes Absolute: 0.8 10*3/uL (ref 0.1–1.0)
Monocytes Relative: 8 % (ref 3–12)
Neutro Abs: 7.2 10*3/uL (ref 1.7–7.7)
Neutro Abs: 8.1 10*3/uL — ABNORMAL HIGH (ref 1.7–7.7)
Neutrophils Relative %: 87 % — ABNORMAL HIGH (ref 43–77)

## 2011-07-20 LAB — POCT I-STAT, CHEM 8
BUN: 7 mg/dL (ref 6–23)
Creatinine, Ser: 0.7 mg/dL (ref 0.50–1.10)
Potassium: 2.8 mEq/L — ABNORMAL LOW (ref 3.5–5.1)
Sodium: 119 mEq/L — CL (ref 135–145)
TCO2: 30 mmol/L (ref 0–100)

## 2011-07-20 LAB — CARDIAC PANEL(CRET KIN+CKTOT+MB+TROPI)
CK, MB: 4.4 ng/mL — ABNORMAL HIGH (ref 0.3–4.0)
Troponin I: <0.3 ng/mL (ref <0.30)

## 2011-07-20 LAB — HEMOGLOBIN A1C
Hgb A1c MFr Bld: 5.5 % (ref <5.7)
Mean Plasma Glucose: 111 mg/dL (ref <117)

## 2011-07-20 LAB — URINE MICROSCOPIC-ADD ON

## 2011-07-20 LAB — PROTIME-INR: INR: 1.72 — ABNORMAL HIGH (ref 0.00–1.49)

## 2011-07-20 LAB — POCT I-STAT TROPONIN I: Troponin i, poc: 0.02 ng/mL (ref 0.00–0.08)

## 2011-07-20 LAB — TSH: TSH: 1.372 u[IU]/mL (ref 0.350–4.500)

## 2011-07-20 LAB — APTT: aPTT: 49 seconds — ABNORMAL HIGH (ref 24–37)

## 2011-07-20 LAB — PRO B NATRIURETIC PEPTIDE: Pro B Natriuretic peptide (BNP): 2911 pg/mL — ABNORMAL HIGH (ref 0–450)

## 2011-07-20 MED ORDER — FUROSEMIDE 40 MG PO TABS
40.0000 mg | ORAL_TABLET | Freq: Every day | ORAL | Status: DC
  Administered 2011-07-20 – 2011-07-23 (×4): 40 mg via ORAL
  Filled 2011-07-20 (×4): qty 1

## 2011-07-20 MED ORDER — WARFARIN - PHARMACIST DOSING INPATIENT: Freq: Every day | Status: DC

## 2011-07-20 MED ORDER — WARFARIN SODIUM 5 MG PO TABS
5.0000 mg | ORAL_TABLET | Freq: Once | ORAL | Status: AC
  Administered 2011-07-20: 5 mg via ORAL
  Filled 2011-07-20: qty 1

## 2011-07-20 MED ORDER — OLMESARTAN MEDOXOMIL 40 MG PO TABS
40.0000 mg | ORAL_TABLET | Freq: Every day | ORAL | Status: DC
  Administered 2011-07-20 – 2011-07-23 (×4): 40 mg via ORAL
  Filled 2011-07-20 (×4): qty 1

## 2011-07-20 MED ORDER — POTASSIUM CHLORIDE CRYS ER 10 MEQ PO TBCR
10.0000 meq | EXTENDED_RELEASE_TABLET | Freq: Two times a day (BID) | ORAL | Status: DC
  Administered 2011-07-20 – 2011-07-23 (×5): 10 meq via ORAL
  Filled 2011-07-20 (×7): qty 1

## 2011-07-20 MED ORDER — OLMESARTAN-AMLODIPINE-HCTZ 40-5-12.5 MG PO TABS: 1.0000 | ORAL_TABLET | Freq: Every day | ORAL | Status: DC

## 2011-07-20 MED ORDER — LEVOTHYROXINE SODIUM 88 MCG PO TABS
88.0000 ug | ORAL_TABLET | Freq: Every day | ORAL | Status: DC
  Administered 2011-07-21 – 2011-07-23 (×3): 88 ug via ORAL
  Filled 2011-07-20 (×3): qty 1

## 2011-07-20 MED ORDER — SODIUM CHLORIDE 0.9 % IV BOLUS (SEPSIS)
500.0000 mL | Freq: Once | INTRAVENOUS | Status: AC
  Administered 2011-07-20: 500 mL via INTRAVENOUS

## 2011-07-20 MED ORDER — ACETAMINOPHEN 325 MG PO TABS
650.0000 mg | ORAL_TABLET | ORAL | Status: DC | PRN
  Administered 2011-07-20 – 2011-07-21 (×2): 650 mg via ORAL
  Filled 2011-07-20: qty 2

## 2011-07-20 MED ORDER — ONDANSETRON HCL 4 MG/2ML IJ SOLN: 4.0000 mg | Freq: Four times a day (QID) | INTRAMUSCULAR | Status: DC | PRN

## 2011-07-20 MED ORDER — ONDANSETRON HCL 4 MG PO TABS: 4.0000 mg | ORAL_TABLET | Freq: Four times a day (QID) | ORAL | Status: DC | PRN

## 2011-07-20 MED ORDER — DICYCLOMINE HCL 10 MG PO CAPS
10.0000 mg | ORAL_CAPSULE | ORAL | Status: DC | PRN
  Administered 2011-07-20: 10 mg via ORAL
  Filled 2011-07-20: qty 1

## 2011-07-20 MED ORDER — POTASSIUM CHLORIDE 10 MEQ/100ML IV SOLN
10.0000 meq | Freq: Once | INTRAVENOUS | Status: AC
  Administered 2011-07-20: 10 meq via INTRAVENOUS
  Filled 2011-07-20: qty 100

## 2011-07-20 MED ORDER — CALCIUM CARBONATE-VITAMIN D 500-200 MG-UNIT PO TABS
1.0000 | ORAL_TABLET | Freq: Two times a day (BID) | ORAL | Status: DC
  Administered 2011-07-20 – 2011-07-21 (×2): 1 via ORAL
  Filled 2011-07-20 (×7): qty 1

## 2011-07-20 MED ORDER — IPRATROPIUM BROMIDE 0.02 % IN SOLN: 0.5000 mg | Freq: Four times a day (QID) | RESPIRATORY_TRACT | Status: DC | PRN

## 2011-07-20 MED ORDER — AMLODIPINE BESYLATE 5 MG PO TABS
5.0000 mg | ORAL_TABLET | Freq: Every day | ORAL | Status: DC
  Administered 2011-07-20 – 2011-07-23 (×4): 5 mg via ORAL
  Filled 2011-07-20 (×4): qty 1

## 2011-07-20 MED ORDER — SODIUM CHLORIDE 0.9 % IJ SOLN
3.0000 mL | Freq: Two times a day (BID) | INTRAMUSCULAR | Status: DC
  Administered 2011-07-20 – 2011-07-22 (×4): 3 mL via INTRAVENOUS

## 2011-07-20 MED ORDER — DICYCLOMINE HCL 10 MG/ML IM SOLN
20.0000 mg | Freq: Once | INTRAMUSCULAR | Status: AC
  Administered 2011-07-20: 20 mg via INTRAMUSCULAR
  Filled 2011-07-20: qty 2

## 2011-07-20 MED ORDER — HYDROCOD POLST-CHLORPHEN POLST 10-8 MG/5ML PO LQCR
5.0000 mL | Freq: Two times a day (BID) | ORAL | Status: DC
  Administered 2011-07-21 – 2011-07-22 (×3): 5 mL via ORAL
  Filled 2011-07-20 (×5): qty 5

## 2011-07-20 MED ORDER — HYDROCHLOROTHIAZIDE 12.5 MG PO CAPS
12.5000 mg | ORAL_CAPSULE | Freq: Every day | ORAL | Status: DC
  Administered 2011-07-20 – 2011-07-21 (×2): 12.5 mg via ORAL
  Filled 2011-07-20 (×2): qty 1

## 2011-07-20 MED ORDER — ONDANSETRON HCL 4 MG/2ML IJ SOLN
4.0000 mg | Freq: Once | INTRAMUSCULAR | Status: AC
  Administered 2011-07-20: 4 mg via INTRAVENOUS
  Filled 2011-07-20: qty 2

## 2011-07-20 MED ORDER — LORATADINE 10 MG PO TABS
10.0000 mg | ORAL_TABLET | Freq: Every day | ORAL | Status: DC
  Administered 2011-07-20 – 2011-07-23 (×4): 10 mg via ORAL
  Filled 2011-07-20 (×4): qty 1

## 2011-07-20 MED ORDER — ENOXAPARIN SODIUM 60 MG/0.6ML ~~LOC~~ SOLN
60.0000 mg | Freq: Two times a day (BID) | SUBCUTANEOUS | Status: DC
  Administered 2011-07-20 – 2011-07-23 (×6): 60 mg via SUBCUTANEOUS
  Filled 2011-07-20 (×8): qty 0.6

## 2011-07-20 MED ORDER — VANCOMYCIN HCL 500 MG IV SOLR
500.0000 mg | Freq: Two times a day (BID) | INTRAVENOUS | Status: DC
  Administered 2011-07-20 – 2011-07-21 (×3): 500 mg via INTRAVENOUS
  Filled 2011-07-20 (×5): qty 500

## 2011-07-20 MED ORDER — ACETAMINOPHEN 325 MG PO TABS
ORAL_TABLET | ORAL | Status: AC
  Administered 2011-07-21: 650 mg via ORAL
  Filled 2011-07-20: qty 2

## 2011-07-20 MED ORDER — DIGOXIN 125 MCG PO TABS
0.1250 mg | ORAL_TABLET | Freq: Every day | ORAL | Status: DC
  Administered 2011-07-20 – 2011-07-23 (×4): 0.125 mg via ORAL
  Filled 2011-07-20 (×4): qty 1

## 2011-07-20 MED ORDER — PANTOPRAZOLE SODIUM 20 MG PO TBEC: 20.0000 mg | DELAYED_RELEASE_TABLET | Freq: Every day | ORAL | Status: DC

## 2011-07-20 MED ORDER — SIMVASTATIN 20 MG PO TABS
20.0000 mg | ORAL_TABLET | Freq: Every day | ORAL | Status: DC
  Administered 2011-07-20 – 2011-07-22 (×3): 20 mg via ORAL
  Filled 2011-07-20 (×4): qty 1

## 2011-07-20 MED ORDER — POTASSIUM CHLORIDE 10 MEQ/100ML IV SOLN
10.0000 meq | INTRAVENOUS | Status: AC
  Administered 2011-07-20 (×3): 10 meq via INTRAVENOUS
  Filled 2011-07-20 (×4): qty 100

## 2011-07-20 MED ORDER — CALCIUM-VITAMIN D 500 MG PO TABS
500.0000 mg | ORAL_TABLET | Freq: Two times a day (BID) | ORAL | Status: DC
Start: 2011-07-20 — End: 2011-07-20

## 2011-07-20 MED ORDER — PANTOPRAZOLE SODIUM 40 MG PO TBEC
40.0000 mg | DELAYED_RELEASE_TABLET | Freq: Every day | ORAL | Status: DC
  Administered 2011-07-20 – 2011-07-23 (×4): 40 mg via ORAL
  Filled 2011-07-20 (×4): qty 1

## 2011-07-20 MED ORDER — ALLOPURINOL 300 MG PO TABS
300.0000 mg | ORAL_TABLET | Freq: Every day | ORAL | Status: DC
  Administered 2011-07-20 – 2011-07-23 (×4): 300 mg via ORAL
  Filled 2011-07-20 (×4): qty 1

## 2011-07-20 MED ORDER — ALBUTEROL SULFATE (5 MG/ML) 0.5% IN NEBU: 2.5000 mg | INHALATION_SOLUTION | Freq: Four times a day (QID) | RESPIRATORY_TRACT | Status: DC | PRN

## 2011-07-20 MED ORDER — ADULT MULTIVITAMIN W/MINERALS CH
1.0000 | ORAL_TABLET | Freq: Every day | ORAL | Status: DC
  Administered 2011-07-20 – 2011-07-23 (×2): 1 via ORAL
  Filled 2011-07-20 (×4): qty 1

## 2011-07-20 MED ORDER — MULTIVITAMINS PO TABS
1.0000 | ORAL_TABLET | Freq: Every day | ORAL | Status: DC
Start: 2011-07-20 — End: 2011-07-20

## 2011-07-20 MED ORDER — ZOLPIDEM TARTRATE 10 MG PO TABS
10.0000 mg | ORAL_TABLET | Freq: Every day | ORAL | Status: DC
  Administered 2011-07-20 – 2011-07-22 (×3): 10 mg via ORAL
  Filled 2011-07-20 (×3): qty 1

## 2011-07-20 MED ORDER — METOPROLOL TARTRATE 12.5 MG HALF TABLET
12.5000 mg | ORAL_TABLET | Freq: Two times a day (BID) | ORAL | Status: DC
  Administered 2011-07-20 – 2011-07-22 (×4): 12.5 mg via ORAL
  Administered 2011-07-22: 22:00:00 via ORAL
  Administered 2011-07-23: 12.5 mg via ORAL
  Filled 2011-07-20 (×7): qty 1

## 2011-07-20 MED ORDER — PIPERACILLIN-TAZOBACTAM 3.375 G IVPB
3.3750 g | Freq: Three times a day (TID) | INTRAVENOUS | Status: DC
  Administered 2011-07-20 – 2011-07-21 (×4): 3.375 g via INTRAVENOUS
  Filled 2011-07-20 (×6): qty 50

## 2011-07-20 NOTE — H&P (Signed)
PCP:  Cassell Clement, MD, MD   DOA:  07/20/2011  9:02 AM  Chief Complaint:  Cough, generalized weakness  HPI: 78 year old female with multiple co morbidities who presented with complaints of progressively worsening cough associated with yellowish sputum over last 5 days and subjective fever and chills. Patient also reports having nausea and vomiting for same duration and on occasions diarrhea which she no longer has at this time. She was put on azithromycin about 5 days ago for possible bronchitis but reports no symptomatic relief from that medication. At this time patient reports no chest pain, no shortness of breath, no blood in stool or urine, no constipation. No lightheadedness and no dizziness, no changes in mental status.  Assessment/Plan  Principal Problem:   *Community acquired pneumonia - start vanco and zosyn for possible pneumonia - keep O2 saturation above 90% - nebulizer treatments as needed Q 6 hours - follow up blood culture results  Active Problems:   Hypothyroidism - check TSH level - restart levothyroxine 88 mcg daily   Hypertension - please restart home medications - telemetry monitoring   Atrial fibrillation - HR 84  - continue coumadin - cycle cardiac enzymes - check digoxin level   S/P aortic valve replacement with metallic valve - goal INR 2.5 to 3.5 - admission INR subtherapeutic - coumadin per pharmacy protocol   Hypokalemia - secondary to GI losses, vomiting/dehydration - repleted in ED - follow up BMP in am   Hyponatremia - secondary to dehydration - continue IV fluids NS - follow up BMP in am  DVT Prophylaxis - on therapeutic anticoagulation  Code Status - full code  Education  - test results and diagnostic studies were discussed with patient and pt's family who was present at the bedside - patient and family have verbalized the understanding - questions were answered at the bedside and contact information was provided for  additional questions or concerns    Allergies: Allergies  Allergen Reactions  . Amiodarone Shortness Of Breath  . Ciprofloxacin     Rectal bleeding  . Fosamax     GI upset  . Macrodantin (Nitrofurantoin)     GI upset  . Meclizine Swelling    tongue  . Norvasc (Amlodipine Besylate)     edema    Prior to Admission medications   Medication Sig Start Date End Date Taking? Authorizing Provider  allopurinol (ZYLOPRIM) 300 MG tablet Take 1 tablet (300 mg total) by mouth daily. 02/12/11  Yes Cassell Clement, MD  calcium-vitamin D 500 MG tablet Take 1 tablet by mouth 2 (two) times daily.     Yes Historical Provider, MD  celecoxib (CELEBREX) 200 MG capsule  01/24/11  Yes Cassell Clement, MD  chlorpheniramine-HYDROcodone [facility] PENNKINETIC ER) 10-8 MG/5ML LQCR Take 5 mLs by mouth every 12 (twelve) hours. 07/19/11  Yes Cassell Clement, MD  dicyclomine (BENTYL) 10 MG capsule Take 10 mg by mouth as needed. For stomach cramps   Yes Historical Provider, MD  fexofenadine (ALLEGRA ALLERGY) 180 MG tablet Take 180 mg by mouth daily. For allergies. As needed   Yes Historical Provider, MD  furosemide (LASIX) 40 MG tablet Take 1 tablet (40 mg total) by mouth daily. 12/20/10  Yes Cassell Clement, MD  LANOXIN 0.25 MG tablet TAKE ONE-HALF TABLET DAILY 06/10/11  Yes Cassell Clement, MD  lansoprazole (PREVACID) 30 MG capsule Take 30 mg by mouth daily.     Yes Historical Provider, MD  metoprolol tartrate (LOPRESSOR) 25 MG tablet TAKE ONE TABLET THREE  TIMES DAILY 06/10/11  Yes Cassell Clement, MD  moxifloxacin (AVELOX) 400 MG tablet Take 1 tablet (400 mg total) by mouth daily. 07/19/11 08/02/11 Yes Cassell Clement, MD  multivitamin [facility]) per tablet Take 1 tablet by mouth daily.     Yes Historical Provider, MD  Omega-3 Fatty Acids (FISH OIL) 1000 MG CAPS Take by mouth 2 (two) times daily.     Yes Historical Provider, MD  potassium chloride SA (K-DUR,KLOR-CON) 20 MEQ tablet  06/10/11   Yes Cassell Clement, MD  simvastatin (ZOCOR) 20 MG tablet 1/2 daily or as directed 08/17/10  Yes Cassell Clement, MD  SYNTHROID 88 MCG tablet TAKE ONE TABLET EVERY DAY 06/24/11  Yes Cassell Clement, MD  warfarin (COUMADIN) 5 MG tablet  04/06/11  Yes Cassell Clement, MD  zolpidem (AMBIEN CR) 12.5 MG CR tablet TAKE ONE TABLET AT BEDTIME                                                  Generic for AMBIEN C 06/10/11  Yes Cassell Clement, MD  nitroGLYCERIN (NITROSTAT) 0.4 MG SL tablet ONE TABLET UNDER TONGUE AS NEEDED 12/04/10   Cassell Clement, MD  Olmesartan-Amlodipine-HCTZ 40-5-12.5 MG TABS Take 1 tablet by mouth daily. 1 tablet daily 08/10/10   Cassell Clement, MD    Past Medical History  Diagnosis Date  . Arrhythmia   . Hypothyroidism   . Cancer     h/o breast cancer  . Stomach problems     seeing Dr Ewing Schlein  . Hypertension     Past Surgical History  Procedure Date  . Cardiac valve replacement     st jude 1996  . Arteriogram 01/12    NO RAS   . Cardioversion     X 2    Social History:  reports that she quit smoking about 23 years ago. She does not have any smokeless tobacco history on file. Her alcohol and drug histories not on file.  Family History  Problem Relation Age of Onset  . Dementia Mother   . Coronary artery disease Father     Review of Systems:  Constitutional: (+)  fever, chills, (-) diaphoresis, appetite change and fatigue.  HEENT: Denies photophobia, eye pain, redness, hearing loss, ear pain, congestion, sore throat, rhinorrhea, sneezing, mouth sores, trouble swallowing, neck pain, neck stiffness and tinnitus.   Respiratory: Denies SOB, DOE, (+) cough, chest tightness,  and no wheezing.   Cardiovascular: Denies chest pain, palpitations and leg swelling.  Gastrointestinal: (+)  nausea, vomiting, (-) abdominal pain, (+) diarrhea, (-) constipation, blood in stool and abdominal distention.  Genitourinary: Denies dysuria, urgency, frequency, hematuria, flank pain  and difficulty urinating.  Musculoskeletal: Denies myalgias, back pain, joint swelling, arthralgias and gait problem.  Skin: Denies pallor, rash and wound.  Neurological: Denies dizziness, seizures, syncope, weakness, light-headedness, numbness and headaches.  Hematological: Denies adenopathy. Easy bruising, personal or family bleeding history  Psychiatric/Behavioral: Denies suicidal ideation, mood changes, confusion, nervousness, sleep disturbance and agitation   Physical Exam:  Filed Vitals:   07/20/11 0911 07/20/11 1254  BP: 155/68 154/83  Pulse: 98 84  Temp: 98.3 F (36.8 C) 99.7 F (37.6 C)  TempSrc: Oral Oral  Resp: 17 19  Height: 5' (1.524 m)   Weight: 58.968 kg (130 lb)   SpO2: 100% 92%    Constitutional: Vital signs reviewed.  Patient  is in no acute distress and cooperative with exam. Alert and oriented x3.  Head: Normocephalic and atraumatic Ear: TM normal bilaterally Mouth: no erythema or exudates, MMM Eyes: PERRL, EOMI, conjunctivae normal, No scleral icterus.  Neck: Supple, Trachea midline normal ROM, No JVD, mass, thyromegaly, or carotid bruit present.  Cardiovascular: RRR, S1 normal, S2 normal, no MRG, pulses symmetric and intact bilaterally Pulmonary/Chest: CTAB, no wheezes, rales, or rhonchi Abdominal: Soft. Non-tender, non-distended, bowel sounds are normal, no masses, organomegaly, or guarding present.  GU: no CVA tenderness Musculoskeletal: No joint deformities, erythema, or stiffness, ROM full and no nontender Ext: no edema and no cyanosis, pulses palpable bilaterally (DP and PT) Hematology: no cervical, inginal, or axillary adenopathy.  Neurological: A&O x3, Strenght is normal and symmetric bilaterally, cranial nerve II-XII are grossly intact, no focal motor deficit, sensory intact to light touch bilaterally.  Skin: Warm, dry and intact. No rash, cyanosis, or clubbing.  Psychiatric: Normal mood and affect. speech and behavior is normal. Judgment and  thought content normal. Cognition and memory are normal.   Labs on Admission:  Results for orders placed during the hospital encounter of 07/20/11 (from the past 48 hour(s))  CBC     Status: Normal   Collection Time   07/20/11  9:55 AM      Component Value Range Comment   WBC 8.3  4.0 - 10.5 (K/uL)    RBC 4.53  3.87 - 5.11 (MIL/uL)    Hemoglobin 13.9  12.0 - 15.0 (g/dL)    HCT 16.1  09.6 - 04.5 (%)    MCV 88.7  78.0 - 100.0 (fL)    MCH 30.7  26.0 - 34.0 (pg)    MCHC 34.6  30.0 - 36.0 (g/dL)    RDW 40.9  81.1 - 91.4 (%)    Platelets 152  150 - 400 (K/uL)   DIFFERENTIAL     Status: Abnormal   Collection Time   07/20/11  9:55 AM      Component Value Range Comment   Neutrophils Relative 87 (*) 43 - 77 (%)    Neutro Abs 7.2  1.7 - 7.7 (K/uL)    Lymphocytes Relative 5 (*) 12 - 46 (%)    Lymphs Abs 0.4 (*) 0.7 - 4.0 (K/uL)    Monocytes Relative 8  3 - 12 (%)    Monocytes Absolute 0.7  0.1 - 1.0 (K/uL)    Eosinophils Relative 0  0 - 5 (%)    Eosinophils Absolute 0.0  0.0 - 0.7 (K/uL)    Basophils Relative 0  0 - 1 (%)    Basophils Absolute 0.0  0.0 - 0.1 (K/uL)   COMPREHENSIVE METABOLIC PANEL     Status: Abnormal   Collection Time   07/20/11  9:55 AM      Component Value Range Comment   Sodium 116 (*) 135 - 145 (mEq/L)    Potassium 2.7 (*) 3.5 - 5.1 (mEq/L)    Chloride 76 (*) 96 - 112 (mEq/L)    CO2 28  19 - 32 (mEq/L)    Glucose, Bld 153 (*) 70 - 99 (mg/dL)    BUN 8  6 - 23 (mg/dL)    Creatinine, Ser 7.82 (*) 0.50 - 1.10 (mg/dL)    Calcium 9.4  8.4 - 10.5 (mg/dL)    Total Protein 7.0  6.0 - 8.3 (g/dL)    Albumin 3.5  3.5 - 5.2 (g/dL)    AST 30  0 - 37 (U/L)  ALT 27  0 - 35 (U/L)    Alkaline Phosphatase 88  39 - 117 (U/L)    Total Bilirubin 0.8  0.3 - 1.2 (mg/dL)    GFR calc non Af Amer >90  >90 (mL/min)    GFR calc Af Amer >90  >90 (mL/min)   LIPASE, BLOOD     Status: Normal   Collection Time   07/20/11  9:55 AM      Component Value Range Comment   Lipase 36  11 - 59  (U/L)   PROTIME-INR     Status: Abnormal   Collection Time   07/20/11  9:55 AM      Component Value Range Comment   Prothrombin Time 20.1 (*) 11.6 - 15.2 (seconds)    INR 1.68 (*) 0.00 - 1.49    APTT     Status: Abnormal   Collection Time   07/20/11  9:55 AM      Component Value Range Comment   aPTT 49 (*) 24 - 37 (seconds)   POCT I-STAT, CHEM 8     Status: Abnormal   Collection Time   07/20/11 10:11 AM      Component Value Range Comment   Sodium 119 (*) 135 - 145 (mEq/L)    Potassium 2.8 (*) 3.5 - 5.1 (mEq/L)    Chloride 80 (*) 96 - 112 (mEq/L)    BUN 7  6 - 23 (mg/dL)    Creatinine, Ser 5.78  0.50 - 1.10 (mg/dL)    Glucose, Bld 469 (*) 70 - 99 (mg/dL)    Calcium, Ion 6.29 (*) 1.12 - 1.32 (mmol/L)    TCO2 30  0 - 100 (mmol/L)    Hemoglobin 15.0  12.0 - 15.0 (g/dL)    HCT 52.8  41.3 - 24.4 (%)   POCT I-STAT TROPONIN I     Status: Normal   Collection Time   07/20/11 11:44 AM      Component Value Range Comment   Troponin i, poc 0.02  0.00 - 0.08 (ng/mL)    Comment 3            URINALYSIS, ROUTINE W REFLEX MICROSCOPIC     Status: Abnormal   Collection Time   07/20/11 12:02 PM      Component Value Range Comment   Color, Urine YELLOW  YELLOW     APPearance CLEAR  CLEAR     Specific Gravity, Urine 1.007  1.005 - 1.030     pH 7.0  5.0 - 8.0     Glucose, UA NEGATIVE  NEGATIVE (mg/dL)    Hgb urine dipstick MODERATE (*) NEGATIVE     Bilirubin Urine NEGATIVE  NEGATIVE     Ketones, ur 15 (*) NEGATIVE (mg/dL)    Protein, ur >010 (*) NEGATIVE (mg/dL)    Urobilinogen, UA 1.0  0.0 - 1.0 (mg/dL)    Nitrite NEGATIVE  NEGATIVE     Leukocytes, UA NEGATIVE  NEGATIVE    URINE MICROSCOPIC-ADD ON     Status: Normal   Collection Time   07/20/11 12:02 PM      Component Value Range Comment   Squamous Epithelial / LPF RARE  RARE     WBC, UA 0-2  <3 (WBC/hpf)    RBC / HPF 0-2  <3 (RBC/hpf)    Bacteria, UA RARE  RARE     Urine-Other MUCOUS PRESENT   AMORPHOUS URATES/PHOSPHATES    Radiological  Exams on Admission: No results found.  Time Spent on Admission: Over  30 minutes  Deborah [name] 07/20/2011, 1:04 PM  [facility] Pager # [phone] Main Office # [phone]

## 2011-07-20 NOTE — ED Notes (Signed)
Hosptialist at bedside 

## 2011-07-20 NOTE — ED Provider Notes (Signed)
History     CSN: [id number]  Arrival date & time 07/20/11  0901   First MD Initiated Contact with Patient 07/20/11 [phone]      Chief Complaint  Patient presents with  . Nausea  . Hypertension  . Tachycardia    (Consider location/radiation/quality/duration/timing/severity/associated sxs/prior treatment) HPI Pt is being treated for URI/bronchitis for the past several days with a Z-pak. Pt seen yesterday and medication changed to avelox. Pt states she has had N, dry heaves, and diarrhea for several days worse yesterday. No fever chills. +abd cramping, decreased energy and anorexia.  Past Medical History  Diagnosis Date  . Arrhythmia   . Hypothyroidism   . Cancer     h/o breast cancer  . Stomach problems     seeing Dr Ewing Schlein  . Hypertension     Past Surgical History  Procedure Date  . Cardiac valve replacement     st jude 1996  . Arteriogram 01/12    NO RAS   . Cardioversion     X 2    Family History  Problem Relation Age of Onset  . Dementia Mother   . Coronary artery disease Father     History  Substance Use Topics  . Smoking status: Former Smoker    Quit date: 04/01/1988  . Smokeless tobacco: Not on file  . Alcohol Use:     OB History    Grav Para Term Preterm Abortions TAB SAB Ect Mult Living                  Review of Systems  Constitutional: Positive for appetite change and fatigue. Negative for fever and chills.  HENT: Negative for sore throat.   Respiratory: Positive for cough. Negative for shortness of breath and wheezing.   Cardiovascular: Positive for palpitations. Negative for chest pain and leg swelling.  Gastrointestinal: Positive for nausea, vomiting, abdominal pain and diarrhea.  Genitourinary: Negative for dysuria.  Musculoskeletal: Negative for back pain.  Skin: Negative for rash and wound.  Neurological: Negative for dizziness, light-headedness, numbness and headaches.    Allergies  Amiodarone; Ciprofloxacin; Fosamax; Macrodantin;  Meclizine; and Norvasc  Home Medications   Current Outpatient Rx  Name Route Sig Dispense Refill  . ALLOPURINOL 300 MG PO TABS Oral Take 1 tablet (300 mg total) by mouth daily. 30 tablet 6  . CALCIUM-VITAMIN D 500 MG PO TABS Oral Take 1 tablet by mouth 2 (two) times daily.      . CELECOXIB 200 MG PO CAPS      . HYDROCOD POLST-CPM POLST ER 10-8 MG/5ML PO LQCR Oral Take 5 mLs by mouth every 12 (twelve) hours. 120 mL 0  . DICYCLOMINE HCL 10 MG PO CAPS Oral Take 10 mg by mouth as needed. For stomach cramps    . FEXOFENADINE HCL 180 MG PO TABS Oral Take 180 mg by mouth daily. For allergies. As needed    . FUROSEMIDE 40 MG PO TABS Oral Take 1 tablet (40 mg total) by mouth daily. 30 tablet 11  . LANOXIN 0.25 MG PO TABS  TAKE ONE-HALF TABLET DAILY 30 tablet 11  . LANSOPRAZOLE 30 MG PO CPDR Oral Take 30 mg by mouth daily.      Marland Kitchen METOPROLOL TARTRATE 25 MG PO TABS  TAKE ONE TABLET THREE                   TIMES DAILY 90 tablet 11  . MOXIFLOXACIN HCL 400 MG PO TABS Oral Take 1 tablet (  400 mg total) by mouth daily. 7 tablet 0  . MULTIVITAMINS PO TABS Oral Take 1 tablet by mouth daily.      Marland Kitchen FISH OIL 1000 MG PO CAPS Oral Take by mouth 2 (two) times daily.      Marland Kitchen POTASSIUM CHLORIDE CRYS ER 20 MEQ PO TBCR      . SIMVASTATIN 20 MG PO TABS  1/2 daily or as directed 30 tablet 11    NEW TABLET SIZE  . SYNTHROID 88 MCG PO TABS  TAKE ONE TABLET EVERY DAY 30 tablet 11    Dispense as written.  . WARFARIN SODIUM 5 MG PO TABS      . ZOLPIDEM TARTRATE ER 12.5 MG PO TBCR  TAKE ONE TABLET AT BEDTIME                                                  Generic for AMBIEN C 30 tablet 4  . NITROGLYCERIN 0.4 MG SL SUBL  ONE TABLET UNDER TONGUE AS NEEDED 25 tablet 11  . OLMESARTAN-AMLODIPINE-HCTZ 40-5-12.5 MG PO TABS Oral Take 1 tablet by mouth daily. 1 tablet daily      BP 155/68  Pulse 98  Temp(Src) 98.3 F (36.8 C) (Oral)  Resp 17  Ht 5' (1.524 m)  Wt 130 lb (58.968 kg)  BMI 25.39 kg/m2  SpO2 100%  Physical  Exam  Nursing note and vitals reviewed. Constitutional: She is oriented to person, place, and time. She appears well-developed and well-nourished. No distress.  HENT:  Head: Normocephalic and atraumatic.  Mouth/Throat: Oropharynx is clear and moist.  Eyes: EOM are normal. Pupils are equal, round, and reactive to light.  Neck: Normal range of motion. Neck supple.  Cardiovascular: Normal rate.        Mechanical heart sounds, irreg irreg  Pulmonary/Chest: Effort normal and breath sounds normal. No respiratory distress. She has no wheezes. She has no rales.  Abdominal: Soft. Bowel sounds are normal. She exhibits no distension. There is no tenderness. There is no rebound and no guarding.  Musculoskeletal: Normal range of motion. She exhibits no edema and no tenderness.  Neurological: She is alert and oriented to person, place, and time.       Move all ext, sensation grossly intact  Skin: Skin is warm and dry. No rash noted. No erythema.  Psychiatric: She has a normal mood and affect. Her behavior is normal.    ED Course  Procedures (including critical care time)  Labs Reviewed  DIFFERENTIAL - Abnormal; Notable for the following:    Neutrophils Relative 87 (*)    Lymphocytes Relative 5 (*)    Lymphs Abs 0.4 (*)    All other components within normal limits  COMPREHENSIVE METABOLIC PANEL - Abnormal; Notable for the following:    Sodium 116 (*)    Potassium 2.7 (*)    Chloride 76 (*)    Glucose, Bld 153 (*)    Creatinine, Ser 0.44 (*)    All other components within normal limits  PROTIME-INR - Abnormal; Notable for the following:    Prothrombin Time 20.1 (*)    INR 1.68 (*)    All other components within normal limits  APTT - Abnormal; Notable for the following:    aPTT 49 (*)    All other components within normal limits  POCT I-STAT, CHEM 8 -  Abnormal; Notable for the following:    Sodium 119 (*)    Potassium 2.8 (*)    Chloride 80 (*)    Glucose, Bld 164 (*)    Calcium, Ion  1.09 (*)    All other components within normal limits  CBC  LIPASE, BLOOD  POCT I-STAT TROPONIN I  URINALYSIS, ROUTINE W REFLEX MICROSCOPIC   Dg Chest 2 View  07/19/2011  *RADIOLOGY REPORT*  Clinical Data: Congestion and shortness of breath.  CHEST - 2 VIEW  Comparison: Two-view chest 04/30/2011.  Findings: Cardiac enlargement is stable.  There is interval increase in left basilar airspace disease, concerning for early infection.  The mild chronic interstitial coarsening is stable otherwise.  The patient is status post median sternotomy.  Right axillary node dissection is again noted.  IMPRESSION:  1.  New left basilar airspace disease is concerning for early infection. 2.  Stable chronic changes. 3.  Cardiomegaly without failure.  Original Report Authenticated By: Jamesetta Orleans. MATTERN, M.D.   Dg Abd Acute W/chest  07/20/2011  *RADIOLOGY REPORT*  Clinical Data: 78 year old female with shortness of breath, altered mental status, cough, nausea vomiting diarrhea.  ACUTE ABDOMEN SERIES (ABDOMEN 2 VIEW & CHEST 1 VIEW)  Comparison: Chest radiographs 07/19/2011.  CT abdomen and pelvis 12/29/2009.  Findings: Stable cardiomegaly and mediastinal contours.  Stable postoperative changes to the right axilla.  No pneumothorax or pneumoperitoneum.  Increased interstitial opacity appears to be chronic.  Lung bases are stable.  No abnormally dilated bowel loops in the abdomen or pelvis.  Some small bowel loops are somewhat featureless.  There is a paucity of colon gas.  Stable right upper quadrant surgical clips. No acute osseous abnormality identified.  IMPRESSION: 1. Nonobstructed bowel gas pattern, no free air.  Somewhat featureless appearance of the small bowel is nonspecific and might reflect enteritis. 2.  Chronic cardiomegaly and pulmonary interstitial changes.  Original Report Authenticated By: Ulla Potash III, M.D.     1. Weakness generalized   2. Hyponatremia   3. Hypokalemia   4. Diarrhea     Date:  07/20/2011  Rate: 93  Rhythm: atrial fibrillation  QRS Axis: normal  Intervals: normal  ST/T Wave abnormalities: nonspecific ST changes and nonspecific T wave changes  Conduction Disutrbances:none  Narrative Interpretation:   Old EKG Reviewed: unchanged and ST depression seen in 1/13 without significant change     MDM          Loren Racer, MD 07/20/11 1158

## 2011-07-20 NOTE — ED Notes (Signed)
Pt c/o nausea from taking zpack for upper respiratory infection.  Pt in home aide called EMS for high blood pressure and increased heartrate.  Pt states "I am very nauseated and feel sick."

## 2011-07-20 NOTE — ED Notes (Signed)
ZOX:[id number]<AV> Expected date:07/20/11<BR> Expected time: 8:50 AM<BR> Means of arrival:Ambulance<BR> Comments:<BR> Breathing diff

## 2011-07-21 DIAGNOSIS — I4729 Other ventricular tachycardia: Secondary | ICD-10-CM | POA: Clinically undetermined

## 2011-07-21 DIAGNOSIS — I369 Nonrheumatic tricuspid valve disorder, unspecified: Secondary | ICD-10-CM

## 2011-07-21 DIAGNOSIS — I472 Ventricular tachycardia: Secondary | ICD-10-CM | POA: Clinically undetermined

## 2011-07-21 LAB — COMPREHENSIVE METABOLIC PANEL
ALT: 23 U/L (ref 0–35)
AST: 27 U/L (ref 0–37)
CO2: 29 mEq/L (ref 19–32)
Calcium: 9 mg/dL (ref 8.4–10.5)
Chloride: 82 mEq/L — ABNORMAL LOW (ref 96–112)
GFR calc Af Amer: >90 mL/min (ref >90)
GFR calc non Af Amer: 81 mL/min — ABNORMAL LOW (ref >90)
Glucose, Bld: 146 mg/dL — ABNORMAL HIGH (ref 70–99)
Sodium: 123 mEq/L — ABNORMAL LOW (ref 135–145)
Total Bilirubin: 0.5 mg/dL (ref 0.3–1.2)

## 2011-07-21 LAB — URINE MICROSCOPIC-ADD ON

## 2011-07-21 LAB — MAGNESIUM: Magnesium: 2.1 mg/dL (ref 1.5–2.5)

## 2011-07-21 LAB — URINALYSIS, ROUTINE W REFLEX MICROSCOPIC
Glucose, UA: NEGATIVE mg/dL
Ketones, ur: NEGATIVE mg/dL
Leukocytes, UA: NEGATIVE
Protein, ur: 100 mg/dL — AB
pH: 6.5 (ref 5.0–8.0)

## 2011-07-21 LAB — BASIC METABOLIC PANEL
Calcium: 8.9 mg/dL (ref 8.4–10.5)
Chloride: 83 mEq/L — ABNORMAL LOW (ref 96–112)
Creatinine, Ser: 0.61 mg/dL (ref 0.50–1.10)
GFR calc Af Amer: >90 mL/min (ref >90)

## 2011-07-21 LAB — CBC
MCH: 30.2 pg (ref 26.0–34.0)
MCHC: 33.3 g/dL (ref 30.0–36.0)
Platelets: 173 10*3/uL (ref 150–400)
RDW: 14.7 % (ref 11.5–15.5)

## 2011-07-21 LAB — PROTIME-INR: Prothrombin Time: 25.3 seconds — ABNORMAL HIGH (ref 11.6–15.2)

## 2011-07-21 LAB — APTT: aPTT: 91 seconds — ABNORMAL HIGH (ref 24–37)

## 2011-07-21 MED ORDER — SODIUM CHLORIDE 0.9 % IV SOLN
INTRAVENOUS | Status: DC
  Administered 2011-07-21: 08:00:00 via INTRAVENOUS

## 2011-07-21 MED ORDER — PREDNISONE 20 MG PO TABS
40.0000 mg | ORAL_TABLET | Freq: Every day | ORAL | Status: DC
  Administered 2011-07-21 – 2011-07-22 (×2): 40 mg via ORAL
  Filled 2011-07-21 (×3): qty 2

## 2011-07-21 MED ORDER — WARFARIN SODIUM 5 MG PO TABS
5.0000 mg | ORAL_TABLET | Freq: Once | ORAL | Status: AC
  Administered 2011-07-21: 5 mg via ORAL
  Filled 2011-07-21: qty 1

## 2011-07-21 MED ORDER — MAGNESIUM SULFATE IN D5W 10-5 MG/ML-% IV SOLN
1.0000 g | Freq: Once | INTRAVENOUS | Status: AC
  Administered 2011-07-21: 1 g via INTRAVENOUS
  Filled 2011-07-21: qty 100

## 2011-07-21 MED ORDER — POTASSIUM CHLORIDE CRYS ER 20 MEQ PO TBCR
40.0000 meq | EXTENDED_RELEASE_TABLET | ORAL | Status: AC
  Administered 2011-07-21 (×3): 40 meq via ORAL
  Filled 2011-07-21 (×3): qty 2

## 2011-07-21 NOTE — Progress Notes (Signed)
ANTICOAGULATION CONSULT NOTE - Initial Consult  Pharmacy Consult for Coumadin/Lovenox Indication: Afib/St Jude Aortic valve  Allergies  Allergen Reactions  . Amiodarone Shortness Of Breath  . Ciprofloxacin     Rectal bleeding  . Fosamax     GI upset  . Macrodantin (Nitrofurantoin)     GI upset  . Meclizine Swelling    tongue  . Norvasc (Amlodipine Besylate)     edema   Patient Measurements: Height: 5' (152.4 cm) Weight: 130 lb (58.968 kg) IBW/kg (Calculated) : 45.5   Vital Signs: Temp: 98.2 F (36.8 C) (04/21 0549) Temp src: Oral (04/21 0549) BP: 150/77 mmHg (04/21 0549) Pulse Rate: 103  (04/21 0549)  Labs:  Basename 07/21/11 0749 07/20/11 2344 07/20/11 1555 07/20/11 1011 07/20/11 0955  HGB 13.0 -- 13.9 -- --  HCT 39.0 -- 40.5 44.0 --  PLT 173 -- 172 -- 152  APTT 91* -- 56* -- 49*  LABPROT 25.3* -- 20.5* -- 20.1*  INR 2.26* -- 1.72* -- 1.68*  HEPARINUNFRC -- -- -- -- --  CREATININE -- -- 0.48* 0.70 0.44*  CKTOTAL -- 133 97 -- --  CKMB -- 5.9* 4.4* -- --  TROPONINI -- <0.30 <0.30 -- --   Estimated Creatinine Clearance: 43.6 ml/min (by C-G formula based on Cr of 0.48).  Medical History: Past Medical History  Diagnosis Date  . Arrhythmia   . Hypothyroidism   . Cancer     h/o breast cancer  . Stomach problems     seeing Dr Ewing Schlein  . Hypertension     Medications:  Scheduled:    . allopurinol  300 mg Oral Daily  . olmesartan  40 mg Oral Daily   And  . amLODipine  5 mg Oral Daily   And  . hydrochlorothiazide  12.5 mg Oral Daily  . calcium-vitamin D  1 tablet Oral BID  . chlorpheniramine-HYDROcodone  5 mL Oral Q12H  . dicyclomine  20 mg Intramuscular Once  . digoxin  0.125 mg Oral Daily  . enoxaparin (LOVENOX) injection  60 mg Subcutaneous Q12H  . furosemide  40 mg Oral Daily  . levothyroxine  88 mcg Oral QAC breakfast  . loratadine  10 mg Oral Daily  . magnesium sulfate 1 - 4 g bolus IVPB  1 g Intravenous Once  . metoprolol tartrate  12.5 mg Oral  BID  . mulitivitamin with minerals  1 tablet Oral Daily  . ondansetron  4 mg Intravenous Once  . pantoprazole  40 mg Oral Q1200  . piperacillin-tazobactam (ZOSYN)  IV  3.375 g Intravenous Q8H  . potassium chloride SA  10 mEq Oral BID  . potassium chloride  10 mEq Intravenous Once  . potassium chloride  10 mEq Intravenous Once  . potassium chloride  10 mEq Intravenous Q1 Hr x 4  . simvastatin  20 mg Oral q1800  . sodium chloride  500 mL Intravenous Once  . sodium chloride  3 mL Intravenous Q12H  . vancomycin  500 mg Intravenous Q12H  . warfarin  5 mg Oral ONCE-1800  . Warfarin - Pharmacist Dosing Inpatient   Does not apply q1800  . zolpidem  10 mg Oral QHS  . DISCONTD: calcium-vitamin D  500 mg Oral BID  . DISCONTD: multivitamin  1 tablet Oral Daily  . DISCONTD: Olmesartan-Amlodipine-HCTZ  1 tablet Oral Daily  . DISCONTD: Olmesartan-Amlodipine-HCTZ  1 tablet Oral Daily  . DISCONTD: pantoprazole  20 mg Oral Q1200    Assessment:  Chronic Coumadin for Afib/Aortic valve  replacement. Coumadin 5mg  tablets, no dose noted.  Admit INR subtherapeutic (1.72), requested Lovenox bridge till INR  2.5 or greater.  INR moving, 2.26 today, continuing Lovenox 50mg  twice daily. H/H, platelets stable, unchanged.  Goal of Therapy:  INR 2.5-3.5   Plan:  Repeat 5mg  Coumadin today Continue daily PT/INR Continue Lovenox till INR 2.5 or greater.  Otho Bellows PharmD Pager [phone] 07/21/2011,9:02 AM

## 2011-07-21 NOTE — Progress Notes (Signed)
ANTIBIOTIC CONSULT NOTE - INITIAL  Pharmacy Consult for Vancomycin/Zosyn Indication: pneumonia  Allergies  Allergen Reactions  . Amiodarone Shortness Of Breath  . Ciprofloxacin     Rectal bleeding  . Fosamax     GI upset  . Macrodantin (Nitrofurantoin)     GI upset  . Meclizine Swelling    tongue  . Norvasc (Amlodipine Besylate)     edema    Patient Measurements: Height: 5' (152.4 cm) Weight: 130 lb (58.968 kg) IBW/kg (Calculated) : 45.5   Vital Signs: Temp: 98.2 F (36.8 C) (04/21 0549) Temp src: Oral (04/21 0549) BP: 150/77 mmHg (04/21 0549) Pulse Rate: 103  (04/21 0549) Intake/Output from previous day: 04/20 0701 - 04/21 0700 In: 240 [P.O.:240] Out: 400 [Urine:400] Intake/Output from this shift:    Labs:  Basename 07/21/11 0749 07/20/11 1555 07/20/11 1011 07/20/11 0955  WBC 7.8 9.6 -- 8.3  HGB 13.0 13.9 15.0 --  PLT 173 172 -- 152  LABCREA -- -- -- --  CREATININE -- 0.48* 0.70 0.44*   Estimated Creatinine Clearance: 43.6 ml/min (by C-G formula based on Cr of 0.48). No results found for this basename: VANCOTROUGH:2,VANCOPEAK:2,VANCORANDOM:2,GENTTROUGH:2,GENTPEAK:2,GENTRANDOM:2,TOBRATROUGH:2,TOBRAPEAK:2,TOBRARND:2,AMIKACINPEAK:2,AMIKACINTROU:2,AMIKACIN:2, in the last 72 hours   Microbiology: No results found for this or any previous visit (from the past 720 hour(s)).  Medical History: Past Medical History  Diagnosis Date  . Arrhythmia   . Hypothyroidism   . Cancer     h/o breast cancer  . Stomach problems     seeing Dr Ewing Schlein  . Hypertension     Medications:  Anti-infectives     Start     Dose/Rate Route Frequency Ordered Stop   07/20/11 1800   vancomycin (VANCOCIN) 500 mg in sodium chloride 0.9 % 100 mL IVPB        500 mg 100 mL/hr over 60 Minutes Intravenous Every 12 hours 07/20/11 1632     07/20/11 1700   piperacillin-tazobactam (ZOSYN) IVPB 3.375 g        3.375 g 12.5 mL/hr over 240 Minutes Intravenous Every 8 hours 07/20/11 1632           Assessment:  [name] with 5 day hx PTA of productive cough, shortness of breath, treated with Azithromycin; no improvement. Cxray on admission with L basilar infiltrate. Admit for treatment PNA  Begin Vanc/Zosyn per pharmacy dosing.  Renal function stable.  Urine culture ordered, not yet collected.  Goal of Therapy:  Vancomycin trough level 15-20 mcg/ml  Plan:  Vancomycin 500mg  q12 Zosyn 3.375 gm q8h   Otho Bellows PharmD Pager [phone] 07/21/2011,9:13 AM

## 2011-07-21 NOTE — Progress Notes (Signed)
*  PRELIMINARY RESULTS* Echocardiogram 2D Echocardiogram has been performed.  Katheren Puller 07/21/2011, 9:27 AM

## 2011-07-21 NOTE — Progress Notes (Addendum)
Patient ID: Deborah [name], female   DOB: November 08, 1926, 78 y.o.   MRN: [id number]   I received a phone call from the patient's primary care team. The patient has had some wide complex tachycardia. This is seen in the setting of marked electrolyte abnormalities. I reviewed the office records. The patient had a nuclear scan in the past few years with no obvious ischemia. There is a mechanical valve in the aortic position. The patient had an echo today that I have reviewed. Ejection fraction is 55%. The mechanical prosthesis in the aortic position is working well. The gradients are mildly increased but these are unchanged since 2012. Overall it would appear that the patient's arrhythmias are probably related to her electrolyte abnormalities. These are being corrected by the primary care team. We will not need to see the patient in consultation. However we will be available at any time to help if any further questions arise.  Jerral Bonito, MD

## 2011-07-21 NOTE — Progress Notes (Signed)
Pt would like MD to sign DNR papers for her

## 2011-07-21 NOTE — Progress Notes (Signed)
Subjective: Patient feeling better.  Has intermittent wheezing with breathing.  No other specific complaints.  Objective: Vital signs in last 24 hours: Filed Vitals:   07/20/11 1540 07/20/11 2128 07/21/11 0400 07/21/11 0549  BP: 166/105 133/91 158/71 150/77  Pulse: 99 100 91 103  Temp: 99.2 F (37.3 C) 98.3 F (36.8 C) 97.7 F (36.5 C) 98.2 F (36.8 C)  TempSrc: Oral Oral Oral Oral  Resp: 18 18 20 20   Height:      Weight:      SpO2: 92% 90% 91% 84%   Weight change:   Intake/Output Summary (Last 24 hours) at 07/21/11 1217 Last data filed at 07/21/11 0500  Gross per 24 hour  Intake    240 ml  Output    400 ml  Net   -160 ml    Physical Exam: General: Awake, Oriented, No acute distress. HEENT: EOMI. Neck: Supple CV: S1 and S2 Lungs: Scattered expiratory wheezing with moderate air movement. Abdomen: Soft, Nontender, Nondistended, +bowel sounds. Ext: Good pulses. Trace edema.  Lab Results:  [facility] 07/21/11 0749 07/20/11 1555  NA 123* 121*  K 2.7* 3.6  CL 82* 84*  CO2 29 25  GLUCOSE 146* 122*  BUN 11 8  CREATININE 0.63 0.48*  CALCIUM 9.0 9.2  MG 2.1 1.6  PHOS -- 2.4    Basename 07/21/11 0749 07/20/11 1555  AST 27 29  ALT 23 25  ALKPHOS 80 87  BILITOT 0.5 0.7  PROT 6.7 7.0  ALBUMIN 3.4* 3.7    Basename 07/20/11 0955  LIPASE 36  AMYLASE --    Basename 07/21/11 0749 07/20/11 1555 07/20/11 0955  WBC 7.8 9.6 --  NEUTROABS -- 8.1* 7.2  HGB 13.0 13.9 --  HCT 39.0 40.5 --  MCV 90.5 89.2 --  PLT 173 172 --    Basename 07/21/11 0749 07/20/11 2344 07/20/11 1555  CKTOTAL 118 133 97  CKMB 5.2* 5.9* 4.4*  CKMBINDEX -- -- --  TROPONINI <0.30 <0.30 <0.30   No components found with this basename: POCBNP:3 No results found for this basename: DDIMER:2 in the last 72 hours  Basename 07/20/11 1555  HGBA1C 5.5   No results found for this basename: CHOL:2,HDL:2,LDLCALC:2,TRIG:2,CHOLHDL:2,LDLDIRECT:2 in the last 72 hours  Basename 07/20/11 1555  TSH  1.372  T4TOTAL --  T3FREE --  THYROIDAB --   No results found for this basename: VITAMINB12:2,FOLATE:2,FERRITIN:2,TIBC:2,IRON:2,RETICCTPCT:2 in the last 72 hours  Micro Results: No results found for this or any previous visit (from the past 240 hour(s)).  Studies/Results: Dg Abd Acute W/chest  07/20/2011  *RADIOLOGY REPORT*  Clinical Data: 78 year old female with shortness of breath, altered mental status, cough, nausea vomiting diarrhea.  ACUTE ABDOMEN SERIES (ABDOMEN 2 VIEW & CHEST 1 VIEW)  Comparison: Chest radiographs 07/19/2011.  CT abdomen and pelvis 12/29/2009.  Findings: Stable cardiomegaly and mediastinal contours.  Stable postoperative changes to the right axilla.  No pneumothorax or pneumoperitoneum.  Increased interstitial opacity appears to be chronic.  Lung bases are stable.  No abnormally dilated bowel loops in the abdomen or pelvis.  Some small bowel loops are somewhat featureless.  There is a paucity of colon gas.  Stable right upper quadrant surgical clips. No acute osseous abnormality identified.  IMPRESSION: 1. Nonobstructed bowel gas pattern, no free air.  Somewhat featureless appearance of the small bowel is nonspecific and might reflect enteritis. 2.  Chronic cardiomegaly and pulmonary interstitial changes.  Original Report Authenticated By: Harley Hallmark, M.D.    Medications: I have reviewed  the patient's current medications. Scheduled Meds:   . allopurinol  300 mg Oral Daily  . olmesartan  40 mg Oral Daily   And  . amLODipine  5 mg Oral Daily  . calcium-vitamin D  1 tablet Oral BID  . chlorpheniramine-HYDROcodone  5 mL Oral Q12H  . digoxin  0.125 mg Oral Daily  . enoxaparin (LOVENOX) injection  60 mg Subcutaneous Q12H  . furosemide  40 mg Oral Daily  . levothyroxine  88 mcg Oral QAC breakfast  . loratadine  10 mg Oral Daily  . magnesium sulfate 1 - 4 g bolus IVPB  1 g Intravenous Once  . metoprolol tartrate  12.5 mg Oral BID  . mulitivitamin with minerals  1  tablet Oral Daily  . pantoprazole  40 mg Oral Q1200  . piperacillin-tazobactam (ZOSYN)  IV  3.375 g Intravenous Q8H  . potassium chloride SA  10 mEq Oral BID  . potassium chloride  10 mEq Intravenous Once  . potassium chloride  10 mEq Intravenous Q1 Hr x 4  . potassium chloride  40 mEq Oral Q4H  . simvastatin  20 mg Oral q1800  . sodium chloride  3 mL Intravenous Q12H  . vancomycin  500 mg Intravenous Q12H  . warfarin  5 mg Oral ONCE-1800  . warfarin  5 mg Oral ONCE-1800  . Warfarin - Pharmacist Dosing Inpatient   Does not apply q1800  . zolpidem  10 mg Oral QHS  . DISCONTD: calcium-vitamin D  500 mg Oral BID  . DISCONTD: hydrochlorothiazide  12.5 mg Oral Daily  . DISCONTD: multivitamin  1 tablet Oral Daily  . DISCONTD: Olmesartan-Amlodipine-HCTZ  1 tablet Oral Daily  . DISCONTD: Olmesartan-Amlodipine-HCTZ  1 tablet Oral Daily  . DISCONTD: pantoprazole  20 mg Oral Q1200   Continuous Infusions:   . sodium chloride 50 mL/hr at 07/21/11 0800   PRN Meds:.acetaminophen, albuterol, dicyclomine, ipratropium, ondansetron (ZOFRAN) IV, ondansetron  Assessment/Plan: Nasal congestion with postnasal drip/bronchitis Initially the patient has been on vancomycin and Zosyn for possible pneumonia, discontinue these antibiotics as chest x-ray does not support findings of any new infiltrate.  Suspect patient may have bronchitis or nasal congestion from recent pollen exposure.  Transition antibiotics back to moxifloxacin which the patient was taking.    Reactive airway disease/hypoxia Patient had scattered expiratory wheezing exam.  Started the patient on steroids reassess respiratory status on steroids.  Continue nebulizer treatment.  Consider doing desaturation study tomorrow if patient needs to be hypoxic.  Patient anticoagulated on Coumadin with therapeutic INR will not check a d-dimer as it will not change clinical management.  Hyponatremia Etiology unclear.  Suspect is likely due to  hydrochlorothiazide.  Discontinue hydrochlorothiazide continue gentle hydration.  Unfortunately patient got a dose of hydrochlorothiazide today.  If the sodium does not improve by tomorrow may consider fluid restricting the patient.  Hypokalemia Replace as needed.  Magnesium normal at 2.1.  Hypothyroidism TSH normal at 1.372 on 07/20/2011.  Continue levothyroxine.  Hypertension Stable.  On home medications, except hydrochlorothiazide discontinued for hyponatremia.  10 beat run of nonsustained V. Tach/wide-complex tachycardia Not symptomatic.  Likely due to electrolyte abnormalities.  Discussed with Dr. Myrtis Ser, no further evaluation this time.  Continue to monitor.  A. Fib Rate controlled.  On chronic anticoagulation.  Continue digoxin.  Status post aortic valve replacement with mechanical valve INR subtherapeutic.  Colon are between 2.5-3.5 .  Continue to dose Coumadin per pharmacy.  Generalized weakness Requests PT for evaluation.  Prophylaxis On therapeutic anticoagulation.  CODE STATUS Full code.   LOS: 1 day  Kramer Hanrahan A, MD 07/21/2011, 12:17 PM

## 2011-07-21 NOTE — Progress Notes (Signed)
Pt had 10 beat run VTACH.  Vitals : BP 158 71 P 91. Asymptomatic and at rest. Attending notified, orders given

## 2011-07-22 LAB — BASIC METABOLIC PANEL
Calcium: 9.3 mg/dL (ref 8.4–10.5)
GFR calc non Af Amer: 78 mL/min — ABNORMAL LOW (ref >90)
Sodium: 126 mEq/L — ABNORMAL LOW (ref 135–145)

## 2011-07-22 LAB — PROTIME-INR: Prothrombin Time: 24.4 seconds — ABNORMAL HIGH (ref 11.6–15.2)

## 2011-07-22 LAB — CBC
MCH: 30 pg (ref 26.0–34.0)
MCHC: 32.6 g/dL (ref 30.0–36.0)
Platelets: 173 10*3/uL (ref 150–400)

## 2011-07-22 LAB — MAGNESIUM: Magnesium: 2 mg/dL (ref 1.5–2.5)

## 2011-07-22 LAB — URINE CULTURE
Colony Count: NO GROWTH
Culture  Setup Time: 201304211419

## 2011-07-22 LAB — GLUCOSE, CAPILLARY: Glucose-Capillary: 126 mg/dL — ABNORMAL HIGH (ref 70–99)

## 2011-07-22 MED ORDER — PREDNISONE 20 MG PO TABS
20.0000 mg | ORAL_TABLET | Freq: Every day | ORAL | Status: DC
  Administered 2011-07-23: 20 mg via ORAL
  Filled 2011-07-22: qty 1

## 2011-07-22 MED ORDER — WARFARIN SODIUM 7.5 MG PO TABS
7.5000 mg | ORAL_TABLET | Freq: Once | ORAL | Status: AC
  Administered 2011-07-22: 7.5 mg via ORAL
  Filled 2011-07-22: qty 1

## 2011-07-22 NOTE — Evaluation (Signed)
Physical Therapy Evaluation Patient Details Name: Deborah [name] MRN: [id number] DOB: 02/08/1927 Today's Date: 07/22/2011 Time: 7829-5621 PT Time Calculation (min): 10 min  PT Assessment / Plan / Recommendation Clinical Impression  Pt presents with diagnosis of Pna. Pt is from independent living and plans to return. Mobilizing well without assist, however sats drop well below 90% on RA during ambulation-81%. Do not anticipate any therapy needs at discharge but will follow in acute setting. Pt will benefit from skilled PT in acute setting to maximize independence and safety in preparation for d/c.    PT Assessment  Patient needs continued PT services    Follow Up Recommendations  No PT follow up    Equipment Recommendations  None recommended by PT    Frequency Min 3X/week    Precautions / Restrictions     Pertinent Vitals/Pain       Mobility  Bed Mobility Bed Mobility: Sit to Supine Sit to Supine: 6: Modified independent (Device/Increase time) Transfers Transfers: Sit to Stand;Stand to Sit Sit to Stand: 6: Modified independent (Device/Increase time) Stand to Sit: 6: Modified independent (Device/Increase time) Ambulation/Gait Ambulation/Gait Assistance: 6: Modified independent (Device/Increase time) Ambulation Distance (Feet): 200 Feet Assistive device: None Ambulation/Gait Assistance Details: Mobilizing well. Pt ambulating in room without assistance. O2 sats 81% on RA during ambulation. Replaced Min[place] O2 2L once back in room. Gait Pattern: Step-through pattern    Exercises     PT Goals Acute Rehab PT Goals PT Goal Formulation: With patient Time For Goal Achievement: 07/29/11 Potential to Achieve Goals: Good Pt will Ambulate: >150 feet;Independently (with sats > 90%) PT Goal: Ambulate - Progress: Goal set today  Visit Information  Last PT Received On: 07/22/11 Assistance Needed: +1    Subjective Data  Subjective: "I feel good" Patient Stated Goal: Return to  Independent Living   Prior Functioning  Home Living Lives With: Alone Type of Home: Independent living facility ([facility]) Home Access: Level entry Home Layout: One level Home Adaptive Equipment: None Prior Function Level of Independence: Independent Communication Communication: No difficulties    Cognition  Overall Cognitive Status: Appears within functional limits for tasks assessed/performed Arousal/Alertness: Awake/alert Orientation Level: Appears intact for tasks assessed Behavior During Session: [facility] for tasks performed    Extremity/Trunk Assessment Right Lower Extremity Assessment RLE ROM/Strength/Tone: Within functional levels RLE Coordination: WFL - gross/fine motor Left Lower Extremity Assessment LLE ROM/Strength/Tone: Within functional levels LLE Coordination: WFL - gross/fine motor Trunk Assessment Trunk Assessment: Normal   Balance    End of Session PT - End of Session Equipment Utilized During Treatment: Gait belt Activity Tolerance: Patient tolerated treatment well (Limited byO2 sats) Patient left: in bed;with call bell/phone within reach   Rebeca Alert [facility] 07/22/2011, 3:36 PM [phone]

## 2011-07-22 NOTE — Progress Notes (Signed)
Subjective: Patient had some shaking of her hands yesterday.  Also had a nightmare yesterday.  Breathing better today.  Objective: Vital signs in last 24 hours: Filed Vitals:   07/21/11 1239 07/21/11 1359 07/21/11 2237 07/22/11 0623  BP:  111/65 134/76 127/81  Pulse: 90 95 84 93  Temp:  98.2 F (36.8 C) 98.2 F (36.8 C) 98.9 F (37.2 C)  TempSrc:  Oral Oral Oral  Resp:   18 20  Height:      Weight:      SpO2:  94% 95% 90%   Weight change:   Intake/Output Summary (Last 24 hours) at 07/22/11 1043 Last data filed at 07/22/11 0933  Gross per 24 hour  Intake      3 ml  Output      0 ml  Net      3 ml    Physical Exam: General: Awake, Oriented, No acute distress. HEENT: EOMI. Neck: Supple CV: S1 and S2 Lungs: Clear to auscultation bilaterally. Abdomen: Soft, Nontender, Nondistended, +bowel sounds. Ext: Good pulses. Trace edema.  Lab Results:  Basename 07/22/11 0500 07/21/11 1325 07/21/11 0749 07/20/11 1555  NA 126* 122* -- --  K 4.7 2.7* -- --  CL 87* 83* -- --  CO2 30 30 -- --  GLUCOSE 188* 156* -- --  BUN 13 12 -- --  CREATININE 0.72 0.61 -- --  CALCIUM 9.3 8.9 -- --  MG 2.0 -- 2.1 --  PHOS -- -- -- 2.4    Basename 07/21/11 0749 07/20/11 1555  AST 27 29  ALT 23 25  ALKPHOS 80 87  BILITOT 0.5 0.7  PROT 6.7 7.0  ALBUMIN 3.4* 3.7    Basename 07/20/11 0955  LIPASE 36  AMYLASE --    Basename 07/22/11 0500 07/21/11 0749 07/20/11 1555 07/20/11 0955  WBC 5.1 7.8 -- --  NEUTROABS -- -- 8.1* 7.2  HGB 13.2 13.0 -- --  HCT 40.5 39.0 -- --  MCV 92.0 90.5 -- --  PLT 173 173 -- --    Basename 07/21/11 0749 07/20/11 2344 07/20/11 1555  CKTOTAL 118 133 97  CKMB 5.2* 5.9* 4.4*  CKMBINDEX -- -- --  TROPONINI <0.30 <0.30 <0.30   No components found with this basename: POCBNP:3 No results found for this basename: DDIMER:2 in the last 72 hours  Basename 07/20/11 1555  HGBA1C 5.5   No results found for this basename:  CHOL:2,HDL:2,LDLCALC:2,TRIG:2,CHOLHDL:2,LDLDIRECT:2 in the last 72 hours  Basename 07/20/11 1555  TSH 1.372  T4TOTAL --  T3FREE --  THYROIDAB --   No results found for this basename: VITAMINB12:2,FOLATE:2,FERRITIN:2,TIBC:2,IRON:2,RETICCTPCT:2 in the last 72 hours  Micro Results: No results found for this or any previous visit (from the past 240 hour(s)).  Studies/Results: No results found.  Medications: I have reviewed the patient's current medications. Scheduled Meds:    . allopurinol  300 mg Oral Daily  . olmesartan  40 mg Oral Daily   And  . amLODipine  5 mg Oral Daily  . calcium-vitamin D  1 tablet Oral BID  . chlorpheniramine-HYDROcodone  5 mL Oral Q12H  . digoxin  0.125 mg Oral Daily  . enoxaparin (LOVENOX) injection  60 mg Subcutaneous Q12H  . furosemide  40 mg Oral Daily  . levothyroxine  88 mcg Oral QAC breakfast  . loratadine  10 mg Oral Daily  . metoprolol tartrate  12.5 mg Oral BID  . mulitivitamin with minerals  1 tablet Oral Daily  . pantoprazole  40 mg Oral Q1200  .  potassium chloride SA  10 mEq Oral BID  . potassium chloride  40 mEq Oral Q4H  . predniSONE  20 mg Oral Q breakfast  . simvastatin  20 mg Oral q1800  . sodium chloride  3 mL Intravenous Q12H  . warfarin  5 mg Oral ONCE-1800  . warfarin  7.5 mg Oral ONCE-1800  . Warfarin - Pharmacist Dosing Inpatient   Does not apply q1800  . zolpidem  10 mg Oral QHS  . DISCONTD: hydrochlorothiazide  12.5 mg Oral Daily  . DISCONTD: piperacillin-tazobactam (ZOSYN)  IV  3.375 g Intravenous Q8H  . DISCONTD: predniSONE  40 mg Oral Q breakfast  . DISCONTD: vancomycin  500 mg Intravenous Q12H   Continuous Infusions:    . DISCONTD: sodium chloride 50 mL/hr at 07/21/11 0800   PRN Meds:.acetaminophen, albuterol, dicyclomine, ipratropium, ondansetron (ZOFRAN) IV, ondansetron  Assessment/Plan: Nasal congestion with postnasal drip/bronchitis Initially the patient has been on vancomycin and Zosyn for possible  pneumonia, discontinue these antibiotics as chest x-ray does not support findings of any new infiltrate.  Suspect patient may have bronchitis or nasal congestion from recent pollen exposure.  Transition antibiotics back to moxifloxacin which the patient was taking.    Reactive airway disease/hypoxia Patient had scattered expiratory wheezing exam.  Decrease dose of steroids and taper quickly, suspect patient has mild reaction to steroids with anxiety and nightmare episode yesterday.  Continue nebulizer treatment.  Patient anticoagulated on Coumadin with therapeutic INR will not check a d-dimer as it will not change clinical management.  Hyponatremia Etiology unclear, improved.  Suspect is likely due to hydrochlorothiazide.  Continue gentle hydration.    Hypokalemia Replace as needed.  Magnesium normal at 2.1.  Hypothyroidism TSH normal at 1.372 on 07/20/2011.  Continue levothyroxine.  Hypertension Stable.  On home medications, except hydrochlorothiazide discontinued for hyponatremia.  10 beat run of nonsustained V. Tach/wide-complex tachycardia Not symptomatic.  Likely due to electrolyte abnormalities, resolved with correction of the electrolytes.  Discussed with Dr. Myrtis Ser on April 21 of 2013, no further evaluation this time.  Discontinued.  2-D echocardiogram on 07/21/2011 showed left ventricle septal dyssynergy, ejection fraction 55%, cavity size was normal, wall thickness was increased in the pattern of mild LVH,  Mechanical prosthetic aortic valve noted, left atrium was mildly dilated common ventricle cavity size was mildly dilated, pulmonary artery peak pressure 34 mm of Hg, no significant change since 2012.  A. Fib Rate controlled.  On chronic anticoagulation.  Continue digoxin.  Status post aortic valve replacement with mechanical valve INR subtherapeutic.  Goal INR between 2.5-3.5 .  Continue to dose Coumadin per pharmacy.  Generalized weakness No PT needs seen.  Prophylaxis On  therapeutic anticoagulation.  CODE STATUS Discussed CODE STATUS with patient and daughter.  She requested to be DO NOT RESUSCITATE/DO NOT INTUBATE.  Disposition To start the patient tomorrow if sodium continues to improve.   LOS: 2 days  Deborah [name] A, MD 07/22/2011, 10:43 AM

## 2011-07-22 NOTE — Progress Notes (Signed)
ANTICOAGULATION CONSULT NOTE - Follow Up Consult  Pharmacy Consult for Warfarin and Lovenox Indication: Afib/[employer] Aortic valve  Allergies  Allergen Reactions  . Amiodarone Shortness Of Breath  . Ciprofloxacin     Rectal bleeding  . Fosamax     GI upset  . Macrodantin (Nitrofurantoin)     GI upset  . Meclizine Swelling    tongue  . Norvasc (Amlodipine Besylate)     edema    Patient Measurements: Height: 5' (152.4 cm) Weight: 130 lb (58.968 kg) IBW/kg (Calculated) : 45.5   Vital Signs: Temp: 98.9 F (37.2 C) (04/22 0623) Temp src: Oral (04/22 0623) BP: 127/81 mmHg (04/22 0623) Pulse Rate: 93  (04/22 0623)  Labs:  Basename 07/22/11 0500 07/21/11 1325 07/21/11 0749 07/20/11 2344 07/20/11 1555 07/20/11 0955  HGB 13.2 -- 13.0 -- -- --  HCT 40.5 -- 39.0 -- 40.5 --  PLT 173 -- 173 -- 172 --  APTT -- -- 91* -- 56* 49*  LABPROT 24.4* -- 25.3* -- 20.5* --  INR 2.15* -- 2.26* -- 1.72* --  HEPARINUNFRC -- -- -- -- -- --  CREATININE 0.72 0.61 0.63 -- -- --  CKTOTAL -- -- 118 133 97 --  CKMB -- -- 5.2* 5.9* 4.4* --  TROPONINI -- -- <0.30 <0.30 <0.30 --   Estimated Creatinine Clearance: 43.6 ml/min (by C-G formula based on Cr of 0.72).   Medications:  Scheduled:    . allopurinol  300 mg Oral Daily  . olmesartan  40 mg Oral Daily   And  . amLODipine  5 mg Oral Daily  . calcium-vitamin D  1 tablet Oral BID  . chlorpheniramine-HYDROcodone  5 mL Oral Q12H  . digoxin  0.125 mg Oral Daily  . enoxaparin (LOVENOX) injection  60 mg Subcutaneous Q12H  . furosemide  40 mg Oral Daily  . levothyroxine  88 mcg Oral QAC breakfast  . loratadine  10 mg Oral Daily  . metoprolol tartrate  12.5 mg Oral BID  . mulitivitamin with minerals  1 tablet Oral Daily  . pantoprazole  40 mg Oral Q1200  . potassium chloride SA  10 mEq Oral BID  . potassium chloride  40 mEq Oral Q4H  . predniSONE  40 mg Oral Q breakfast  . simvastatin  20 mg Oral q1800  . sodium chloride  3 mL Intravenous  Q12H  . warfarin  5 mg Oral ONCE-1800  . Warfarin - Pharmacist Dosing Inpatient   Does not apply q1800  . zolpidem  10 mg Oral QHS  . DISCONTD: hydrochlorothiazide  12.5 mg Oral Daily  . DISCONTD: piperacillin-tazobactam (ZOSYN)  IV  3.375 g Intravenous Q8H  . DISCONTD: vancomycin  500 mg Intravenous Q12H   Infusions:    . sodium chloride 50 mL/hr at 07/21/11 0800   PRN: acetaminophen, albuterol, dicyclomine, ipratropium, ondansetron (ZOFRAN) IV, ondansetron  Assessment: [name] on chronic warfarin for hx of Afib/AVR. Home dose reported at warfarin 5mg  daily. On Lovenox also until INR >/= 2.5. CBC stable, INR remains below goal 2.5-3.5. No bleeding reported. Will give slightly higher dose of warfarin tonight.  Goal of Therapy:  INR 2.5-3.5 (AVR)   Plan:  1) Warfarin 7.5mg  PO x1 at 18:00 2) Continue Lovenox 60mg  SQ q12h until INR >/= 2.5.  Darrol Angel, PharmD Pager: [phone] 07/22/2011,8:17 AM

## 2011-07-22 NOTE — Progress Notes (Signed)
CARE MANAGEMENT NOTE 07/22/2011  Patient:  CLIFTON, KOVACIC   Account Number:  [id number]  Date Initiated:  07/22/2011  Documentation initiated by:  Oddie Bottger  Subjective/Objective Assessment:   pt admitted with hyponatremia and hypokalemia, artial fib, and short runs of v tach,     Action/Plan:   lives at [facility]   Anticipated DC Date:  07/25/2011   Anticipated DC Plan:  ASSISTED LIVING / REST HOME  In-house referral  NA      DC Planning Services  NA      PAC Choice  NA   Choice offered to / List presented to:  NA   DME arranged  NA      DME agency  NA     HH arranged  NA      HH agency  NA   Status of service:  In process, will continue to follow Medicare Important Message given?  NA - LOS <3 / Initial given by admissions (If response is "NO", the following Medicare IM given date fields will be blank) Date Medicare IM given:   Date Additional Medicare IM given:    Discharge Disposition:    Per UR Regulation:  Reviewed for med. necessity/level of care/duration of stay  If discussed at Long Length of Stay Meetings, dates discussed:    Comments:  [id number]/Sephora Boyar Lorrin Mais Case Management [phone]

## 2011-07-23 LAB — BASIC METABOLIC PANEL
CO2: 34 mEq/L — ABNORMAL HIGH (ref 19–32)
Calcium: 9.2 mg/dL (ref 8.4–10.5)
Creatinine, Ser: 0.66 mg/dL (ref 0.50–1.10)
Glucose, Bld: 103 mg/dL — ABNORMAL HIGH (ref 70–99)

## 2011-07-23 LAB — GLUCOSE, CAPILLARY

## 2011-07-23 MED ORDER — ACETAMINOPHEN 325 MG PO TABS: 650.0000 mg | ORAL_TABLET | ORAL | Status: AC | PRN

## 2011-07-23 MED ORDER — PREDNISONE 20 MG PO TABS: ORAL_TABLET | ORAL | Status: DC

## 2011-07-23 MED ORDER — MOXIFLOXACIN HCL 400 MG PO TABS: 400.0000 mg | ORAL_TABLET | Freq: Every day | ORAL | Status: AC

## 2011-07-23 MED ORDER — AMLODIPINE BESYLATE 5 MG PO TABS: 5.0000 mg | ORAL_TABLET | Freq: Every day | ORAL | Status: DC

## 2011-07-23 MED ORDER — METOPROLOL TARTRATE 12.5 MG HALF TABLET: 12.5000 mg | ORAL_TABLET | Freq: Two times a day (BID) | ORAL | Status: DC

## 2011-07-23 MED ORDER — IPRATROPIUM BROMIDE 0.02 % IN SOLN: 0.5000 mg | Freq: Four times a day (QID) | RESPIRATORY_TRACT | Status: DC | PRN

## 2011-07-23 MED ORDER — WARFARIN SODIUM 5 MG PO TABS
5.0000 mg | ORAL_TABLET | Freq: Once | ORAL | Status: DC
  Filled 2011-07-23: qty 1

## 2011-07-23 MED ORDER — ALBUTEROL SULFATE (5 MG/ML) 0.5% IN NEBU: 2.5000 mg | INHALATION_SOLUTION | Freq: Four times a day (QID) | RESPIRATORY_TRACT | Status: DC | PRN

## 2011-07-23 MED ORDER — OLMESARTAN MEDOXOMIL 40 MG PO TABS: 40.0000 mg | ORAL_TABLET | Freq: Every day | ORAL | Status: DC

## 2011-07-23 NOTE — Progress Notes (Signed)
ANTICOAGULATION CONSULT NOTE - Follow Up Consult  Pharmacy Consult for Warfarin and Lovenox Indication: Afib/[employer] Aortic valve  Allergies  Allergen Reactions  . Amiodarone Shortness Of Breath  . Ciprofloxacin     Rectal bleeding  . Fosamax     GI upset  . Macrodantin (Nitrofurantoin)     GI upset  . Meclizine Swelling    tongue  . Norvasc (Amlodipine Besylate)     edema    Patient Measurements: Height: 5' (152.4 cm) Weight: 126 lb (57.153 kg) IBW/kg (Calculated) : 45.5   Vital Signs: Temp: 97.8 F (36.6 C) (04/23 0649) Temp src: Oral (04/23 0649) BP: 129/73 mmHg (04/23 0649) Pulse Rate: 71  (04/23 0649)  Labs:  Basename 07/23/11 0525 07/22/11 0500 07/21/11 1325 07/21/11 0749 07/20/11 2344 07/20/11 1555 07/20/11 0955  HGB -- 13.2 -- 13.0 -- -- --  HCT -- 40.5 -- 39.0 -- 40.5 --  PLT -- 173 -- 173 -- 172 --  APTT -- -- -- 91* -- 56* 49*  LABPROT 29.6* 24.4* -- 25.3* -- -- --  INR 2.76* 2.15* -- 2.26* -- -- --  HEPARINUNFRC -- -- -- -- -- -- --  CREATININE 0.66 0.72 0.61 -- -- -- --  CKTOTAL -- -- -- 118 133 97 --  CKMB -- -- -- 5.2* 5.9* 4.4* --  TROPONINI -- -- -- <0.30 <0.30 <0.30 --   Estimated Creatinine Clearance: 43 ml/min (by C-G formula based on Cr of 0.66).   Medications:  Scheduled:     . allopurinol  300 mg Oral Daily  . olmesartan  40 mg Oral Daily   And  . amLODipine  5 mg Oral Daily  . calcium-vitamin D  1 tablet Oral BID  . chlorpheniramine-HYDROcodone  5 mL Oral Q12H  . digoxin  0.125 mg Oral Daily  . enoxaparin (LOVENOX) injection  60 mg Subcutaneous Q12H  . furosemide  40 mg Oral Daily  . levothyroxine  88 mcg Oral QAC breakfast  . loratadine  10 mg Oral Daily  . metoprolol tartrate  12.5 mg Oral BID  . mulitivitamin with minerals  1 tablet Oral Daily  . pantoprazole  40 mg Oral Q1200  . potassium chloride SA  10 mEq Oral BID  . predniSONE  20 mg Oral Q breakfast  . simvastatin  20 mg Oral q1800  . sodium chloride  3 mL  Intravenous Q12H  . warfarin  7.5 mg Oral ONCE-1800  . Warfarin - Pharmacist Dosing Inpatient   Does not apply q1800  . zolpidem  10 mg Oral QHS  . DISCONTD: predniSONE  40 mg Oral Q breakfast   Infusions:     . DISCONTD: sodium chloride 50 mL/hr at 07/21/11 0800   PRN: acetaminophen, albuterol, dicyclomine, ipratropium, ondansetron (ZOFRAN) IV, ondansetron  Assessment: [name] on chronic warfarin for hx of Afib/AVR. Home dose reported at warfarin 5mg  daily. On Lovenox also until INR >/= 2.5. CBC stable, INR now therapeutic. No bleeding reported. Will d/c Lovenox.  Goal of Therapy:  INR 2.5-3.5 (AVR)   Plan:  1) Warfarin 5mg  PO x1 at 18:00 2) D/C Lovenox 60mg  SQ q12h (INR >/= 2.5).  Darrol Angel, PharmD Pager: [phone] 07/23/2011,7:19 AM

## 2011-07-23 NOTE — Progress Notes (Signed)
Subjective: Breathing better wondering if she can be discharged today.  Objective: Vital signs in last 24 hours: Filed Vitals:   07/22/11 1500 07/22/11 1700 07/22/11 2225 07/23/11 0649  BP: 133/57  143/72 129/73  Pulse: 79  100 71  Temp: 98.4 F (36.9 C)  97.3 F (36.3 C) 97.8 F (36.6 C)  TempSrc: Oral  Oral Oral  Resp: 18  16 17   Height:  5' (1.524 m)    Weight:  57.153 kg (126 lb)    SpO2: 93%  84% 85%   Weight change:   Intake/Output Summary (Last 24 hours) at 07/23/11 1154 Last data filed at 07/22/11 1845  Gross per 24 hour  Intake    240 ml  Output    300 ml  Net    -60 ml    Physical Exam: General: Awake, Oriented, No acute distress. HEENT: EOMI. Neck: Supple CV: S1 and S2 Lungs: Coarse breath sounds bilaterally with scattered wheezing, good air movement. Abdomen: Soft, Nontender, Nondistended, +bowel sounds. Ext: Good pulses. Trace edema.  Lab Results:  Basename 07/23/11 0525 07/22/11 0500 07/21/11 0749 07/20/11 1555  NA 130* 126* -- --  K 5.0 4.7 -- --  CL 92* 87* -- --  CO2 34* 30 -- --  GLUCOSE 103* 188* -- --  BUN 17 13 -- --  CREATININE 0.66 0.72 -- --  CALCIUM 9.2 9.3 -- --  MG -- 2.0 2.1 --  PHOS -- -- -- 2.4    Basename 07/21/11 0749 07/20/11 1555  AST 27 29  ALT 23 25  ALKPHOS 80 87  BILITOT 0.5 0.7  PROT 6.7 7.0  ALBUMIN 3.4* 3.7   No results found for this basename: LIPASE:2,AMYLASE:2 in the last 72 hours  Basename 07/22/11 0500 07/21/11 0749 07/20/11 1555  WBC 5.1 7.8 --  NEUTROABS -- -- 8.1*  HGB 13.2 13.0 --  HCT 40.5 39.0 --  MCV 92.0 90.5 --  PLT 173 173 --    Basename 07/21/11 0749 07/20/11 2344 07/20/11 1555  CKTOTAL 118 133 97  CKMB 5.2* 5.9* 4.4*  CKMBINDEX -- -- --  TROPONINI <0.30 <0.30 <0.30   No components found with this basename: POCBNP:3 No results found for this basename: DDIMER:2 in the last 72 hours  Basename 07/20/11 1555  HGBA1C 5.5   No results found for this basename:  CHOL:2,HDL:2,LDLCALC:2,TRIG:2,CHOLHDL:2,LDLDIRECT:2 in the last 72 hours  Basename 07/20/11 1555  TSH 1.372  T4TOTAL --  T3FREE --  THYROIDAB --   No results found for this basename: VITAMINB12:2,FOLATE:2,FERRITIN:2,TIBC:2,IRON:2,RETICCTPCT:2 in the last 72 hours  Micro Results: Recent Results (from the past 240 hour(s))  URINE CULTURE     Status: Normal   Collection Time   07/21/11 10:41 AM      Component Value Range Status Comment   Specimen Description URINE, CLEAN CATCH   Final    Special Requests NONE   Final    Culture  Setup Time [id number]   Final    Colony Count NO GROWTH   Final    Culture NO GROWTH   Final    Report Status 07/22/2011 FINAL   Final     Studies/Results: No results found.  Medications: I have reviewed the patient's current medications. Scheduled Meds:    . allopurinol  300 mg Oral Daily  . olmesartan  40 mg Oral Daily   And  . amLODipine  5 mg Oral Daily  . calcium-vitamin D  1 tablet Oral BID  . chlorpheniramine-HYDROcodone  5 mL Oral  Q12H  . digoxin  0.125 mg Oral Daily  . furosemide  40 mg Oral Daily  . levothyroxine  88 mcg Oral QAC breakfast  . loratadine  10 mg Oral Daily  . metoprolol tartrate  12.5 mg Oral BID  . mulitivitamin with minerals  1 tablet Oral Daily  . pantoprazole  40 mg Oral Q1200  . potassium chloride SA  10 mEq Oral BID  . predniSONE  20 mg Oral Q breakfast  . simvastatin  20 mg Oral q1800  . sodium chloride  3 mL Intravenous Q12H  . warfarin  5 mg Oral ONCE-1800  . warfarin  7.5 mg Oral ONCE-1800  . Warfarin - Pharmacist Dosing Inpatient   Does not apply q1800  . zolpidem  10 mg Oral QHS  . DISCONTD: enoxaparin (LOVENOX) injection  60 mg Subcutaneous Q12H   Continuous Infusions:   PRN Meds:.acetaminophen, albuterol, dicyclomine, ipratropium, ondansetron (ZOFRAN) IV, ondansetron  Assessment/Plan: Nasal congestion with postnasal drip/bronchitis Initially the patient has been on vancomycin and Zosyn for  possible pneumonia, discontinue these antibiotics as chest x-ray does not support findings of any new infiltrate.  Suspect patient may have bronchitis or nasal congestion from recent pollen exposure.  Transitioned antibiotics back to moxifloxacin which the patient was taking prior to hospitalization.  Patient to continue moxifloxacin until 07/27/2011 to complete a seven-day course of antibiotics.  Reactive airway disease/hypoxia Patient had scattered expiratory wheezing exam.  Decrease dose of steroids and taper quickly, suspect patient has mild reaction to steroids with anxiety and nightmare episode yesterday.  Continue nebulizer treatment.  Patient anticoagulated on Coumadin with therapeutic INR will not check a d-dimer as it will not change clinical management.  Patient saturated 85% on room air qualified for oxygen.  She was instructed to followup with her primary care physician as outpatient to determine if and when she can come off oxygen.  Hyponatremia Etiology unclear, improved sodium was 130 prior to discharge was 119 initial on admission.  Suspect is likely due to hydrochlorothiazide.  Patient to have BP Max checked on 07/25/2011 and have results forwarded to our care physician.   Hypokalemia Resolved with replacement.  Magnesium normal at 2.1.  Hypothyroidism TSH normal at 1.372 on 07/20/2011.  Continue levothyroxine.  Hypertension Stable.  On home medications, except hydrochlorothiazide discontinued for hyponatremia.  10 beat run of nonsustained V. Tach/wide-complex tachycardia Not symptomatic.  Likely due to electrolyte abnormalities, resolved with correction of the electrolytes.  Discussed with Dr. Myrtis Ser on April 21 of 2013, no further evaluation this time.  Discontinued.  2-D echocardiogram on 07/21/2011 showed left ventricle septal dyssynergy, ejection fraction 55%, cavity size was normal, wall thickness was increased in the pattern of mild LVH,  Mechanical prosthetic aortic valve  noted, left atrium was mildly dilated common ventricle cavity size was mildly dilated, pulmonary artery peak pressure 34 mm of Hg, no significant change since 2012.  A. Fib Rate controlled.  On chronic anticoagulation.  Continue digoxin.  Status post aortic valve replacement with mechanical valve INR therapeutic.  Goal INR between 2.5-3.5 .  Continue to dose Coumadin per pharmacy.  Patient to have her PT/INR checked in 07/25/2011 and have results sent to her primary care physician for further dosing of Coumadin.  Generalized weakness Patient was evaluated by physical therapy with perhaps benefiting from home health.  Patient and family were comfortable and have the patient go to skilled nursing facility which will be arranged at discharge.  Prophylaxis On therapeutic anticoagulation.  CODE STATUS DO  NOT RESUSCITATE/DO NOT INTUBATE.  Disposition Discharge the patient to skilled nursing facility today.   LOS: 3 days  Nashia Remus A, MD 07/23/2011, 11:54 AM

## 2011-07-23 NOTE — Progress Notes (Signed)
Clinical Social Work Department CLINICAL SOCIAL WORK PLACEMENT NOTE 07/23/2011  Patient:  DARIAN, ACE  Account Number:  [id number] Admit date:  07/20/2011  Clinical Social Worker:  Orpah Greek  Date/time:  07/23/2011 12:40 PM  Clinical Social Work is seeking post-discharge placement for this patient at the following level of care:   SKILLED NURSING   (*CSW will update this form in Epic as items are completed)   07/23/2011  Patient/family provided with [facility] Department of Clinical Social Work's list of facilities offering this level of care within the geographic area requested by the patient (or if unable, by the patient's family).  07/23/2011  Patient/family informed of their freedom to choose among providers that offer the needed level of care, that participate in Medicare, Medicaid or managed care program needed by the patient, have an available bed and are willing to accept the patient.  07/23/2011  Patient/family informed of [facility]' ownership interest in [facility], as well as of the fact that they are under no obligation to receive care at this facility.  PASARR submitted to EDS on 07/23/2011 PASARR number received from [employer] on   FL2 transmitted to all facilities in geographic area requested by pt/family on  07/23/2011 FL2 transmitted to all facilities within larger geographic area on   Patient informed that his/her managed care company has contracts with or will negotiate with  certain facilities, including the following:     Patient/family informed of bed offers received:  07/23/2011 Patient chooses bed at [facility] Physician recommends and patient chooses bed at    Patient to be transferred to [facility] on  07/23/2011 Patient to be transferred to facility by daughter's car.  The following physician request were entered in Epic:   Additional Comments:  Unice Bailey, Theresia Majors [phone]

## 2011-07-23 NOTE — Progress Notes (Signed)
Clinical Social Work Department BRIEF PSYCHOSOCIAL ASSESSMENT 07/23/2011  Patient:  Deborah [name], Deborah [name]     Account Number:  [id number]     Admit date:  07/20/2011  Clinical Social Worker:  Orpah Greek  Date/Time:  07/23/2011 12:30 PM  Referred by:  Physician  Date Referred:  07/23/2011 Referred for  SNF Placement   Other Referral:   Interview type:  Patient Other interview type:    PSYCHOSOCIAL DATA Living Status:  ALONE Admitted from facility:  [facility] Level of care:  Independent Living Primary support name:  Sylvan Cheese (daughter) [phone] Primary support relationship to patient:  CHILD, ADULT Degree of support available:    CURRENT CONCERNS Current Concerns  Post-Acute Placement   Other Concerns:    SOCIAL WORK ASSESSMENT / PLAN CSW received referral that patient was admitted from [facility] and requesting to go to their SNF/Rehab before returning to Independent Living.   Assessment/plan status:  Information/Referral to [facility] Other assessment/ plan:   CSW spoke with Durward Mallard (ph#: [phone]) @ [facility] to confirm that there is a bed available in their SNF/Rehab for patient to go to. CSW completed FL2 and faxed discharge summary & FL2 to facility. Daughter to provide transport when ready to leave after lunch.   Information/referral to community resources:    PATIENT'S/FAMILY'S RESPONSE TO PLAN OF CARE: Patient was relieved to hear that [facility] has SNF bed available and eager to get stronger.        Unice Bailey, LCSWA [phone]

## 2011-07-23 NOTE — Progress Notes (Signed)
Physical Therapy Treatment Patient Details Name: Deborah [name] MRN: [id number] DOB: 1927/01/14 Today's Date: 07/23/2011 Time: 1000-1015 PT Time Calculation (min): 15 min  PT Assessment / Plan / Recommendation Comments on Treatment Session  Pt still experiencing decreased sats with activity. Pt stating she contacted [facility] facility about discharging to rehab there for a few days. Explained to pt and family that she currently does not necessarily need ST rehab-pt could have home health/home health aide. Pt's only limitation is decreased sats/some SOB with activity when not using O2.Pt is modified independent with mobility. However, pt/family prefer for pt to d/c to rehab for a few days.    Follow Up Recommendations  No PT follow up; ?HH/home health aide. (Pt/family prefer/requesting rehab for few days)    Equipment Recommendations  None recommended by PT    Frequency Min 3X/week   Plan Discharge plan remains appropriate    Precautions / Restrictions Precautions Precautions: None   Pertinent Vitals/Pain SATURATION QUALIFICATIONS:  Patient Saturations on Room Air at Rest = 91%  Patient Saturations on Room Air while Ambulating = 83%  Patient Saturations on 2 Liters of oxygen while Ambulating = 89-91%      Mobility  Bed Mobility Bed Mobility: Supine to Sit Supine to Sit: 6: Modified independent (Device/Increase time) Transfers Transfers: Sit to Stand;Stand to Sit Sit to Stand: 6: Modified independent (Device/Increase time) Stand to Sit: 6: Modified independent (Device/Increase time) Ambulation/Gait Ambulation/Gait Assistance: 6: Modified independent (Device/Increase time) Ambulation Distance (Feet): 200 Feet (200'x1, 100'x1) Assistive device: None Ambulation/Gait Assistance Details: Good gait speed. No LOB. O2 sats dropped to 89-91% on 2 LO2 and 83% on RA. Gait Pattern: Within Functional Limits    Exercises     PT Goals Acute Rehab PT Goals PT Goal Formulation:  With patient Potential to Achieve Goals: Good PT Goal: Ambulate - Progress: Progressing toward goal  Visit Information  Last PT Received On: 07/23/11 Assistance Needed: +1    Subjective Data  Subjective: "I'm tired from this cough" Patient Stated Goal: Return home but pt considering ST rehab at [facility] for a few days since family will be out of town   Cognition  Overall Cognitive Status: Appears within functional limits for tasks assessed/performed Arousal/Alertness: Awake/alert Orientation Level: Appears intact for tasks assessed Behavior During Session: [facility] for tasks performed    Balance     End of Session PT - End of Session Equipment Utilized During Treatment: Gait belt;Oxygen Activity Tolerance: Patient tolerated treatment well Patient left: in bed;with call bell/phone within reach;with family/visitor present    Rebeca Alert [facility] 07/23/2011, 10:24 AM [phone]

## 2011-07-23 NOTE — Discharge Summary (Signed)
Discharge Summary  CAELIN ROSEN MR#: [id number]  DOB:[birth year]  Date of Admission: 07/20/2011 Date of Discharge: 07/23/2011  Patient's PCP: Cassell Clement, MD, MD  Attending Physician:Akima Slaugh A  Consults: None  Discharge Diagnoses: Principal Problem:  *Bronchitis Active Problems:  Hypothyroidism  Hypertension  Atrial fibrillation  S/P aortic valve replacement with metallic valve  Hypokalemia  Hyponatremia  Nonsustained ventricular tachycardia   Brief Admitting History and Physical 78 year old Caucasian female with multiple comorbidities presented on 07/20/2011 with cough and generalized weakness.  Discharge Medications Medication List  As of 07/23/2011 11:59 AM   STOP taking these medications         Olmesartan-Amlodipine-HCTZ 40-5-12.5 MG Tabs         TAKE these medications         acetaminophen 325 MG tablet   Commonly known as: TYLENOL   Take 2 tablets (650 mg total) by mouth every 4 (four) hours as needed.      albuterol (5 MG/ML) 0.5% nebulizer solution   Commonly known as: PROVENTIL   Take 0.5 mLs (2.5 mg total) by nebulization every 6 (six) hours as needed for wheezing or shortness of breath.      ALLEGRA ALLERGY 180 MG tablet   Generic drug: fexofenadine   Take 180 mg by mouth daily. For allergies. As needed      allopurinol 300 MG tablet   Commonly known as: ZYLOPRIM   Take 1 tablet (300 mg total) by mouth daily.      amLODipine 5 MG tablet   Commonly known as: NORVASC   Take 1 tablet (5 mg total) by mouth daily.      BENTYL 10 MG capsule   Generic drug: dicyclomine   Take 10 mg by mouth as needed. For stomach cramps      calcium-vitamin D 500 MG tablet   Take 1 tablet by mouth 2 (two) times daily.      CELEBREX 200 MG capsule   Generic drug: celecoxib      chlorpheniramine-HYDROcodone 10-8 MG/5ML Lqcr   Commonly known as: TUSSIONEX   Take 5 mLs by mouth every 12 (twelve) hours.      COUMADIN 5 MG tablet   Generic drug:  warfarin      Fish Oil 1000 MG Caps   Take by mouth 2 (two) times daily.      furosemide 40 MG tablet   Commonly known as: LASIX   Take 1 tablet (40 mg total) by mouth daily.      ipratropium 0.02 % nebulizer solution   Commonly known as: ATROVENT   Take 2.5 mLs (0.5 mg total) by nebulization every 6 (six) hours as needed.      LANOXIN 0.25 MG tablet   Generic drug: digoxin   TAKE ONE-HALF TABLET DAILY      lansoprazole 30 MG capsule   Commonly known as: PREVACID   Take 30 mg by mouth daily.      metoprolol tartrate 12.5 mg Tabs   Commonly known as: LOPRESSOR   Take 0.5 tablets (12.5 mg total) by mouth 2 (two) times daily.      moxifloxacin 400 MG tablet   Commonly known as: AVELOX   Take 1 tablet (400 mg total) by mouth daily. Discontinue after 07/27/2011.      multivitamin per tablet   Take 1 tablet by mouth daily.      nitroGLYCERIN 0.4 MG SL tablet   Commonly known as: NITROSTAT   ONE TABLET UNDER TONGUE AS NEEDED  olmesartan 40 MG tablet   Commonly known as: BENICAR   Take 1 tablet (40 mg total) by mouth daily.      potassium chloride SA 20 MEQ tablet   Commonly known as: K-DUR,KLOR-CON      predniSONE 20 MG tablet   Commonly known as: DELTASONE   Take prednisone 20 mg daily for 2 days, 10 mg daily for 2 days, then 5 mg daily for 2 days then discontinue.      simvastatin 20 MG tablet   Commonly known as: ZOCOR   1/2 daily or as directed      SYNTHROID 88 MCG tablet   Generic drug: levothyroxine   TAKE ONE TABLET EVERY DAY      zolpidem 12.5 MG CR tablet   Commonly known as: AMBIEN CR   TAKE ONE TABLET AT BEDTIME                                                  Generic for [facility] Course: Nasal congestion with postnasal drip/bronchitis Initially the patient has been on vancomycin and Zosyn for possible pneumonia, discontinue these antibiotics as chest x-ray does not support findings of any new infiltrate.  Suspect patient  may have bronchitis or nasal congestion from recent pollen exposure.  Transitioned antibiotics back to moxifloxacin which the patient was taking prior to hospitalization.  Patient to continue moxifloxacin until 07/27/2011 to complete a seven-day course of antibiotics.  Reactive airway disease/hypoxia Patient had scattered expiratory wheezing exam during the course of the hospital stay.  Initially started the patient on oral prednisone. Suspect patient has mild reaction to steroids with anxiety and nightmare episode 2 days ago, decreased dose of steroids and taper quickly.  Continue nebulizer treatment.  Patient anticoagulated on Coumadin with therapeutic INR will not check a d-dimer as it will not change clinical management.  Patient saturated 85% on room air prior to discharge and qualified for oxygen, she will have oxygen arranged at discharge.  She was instructed to followup with her primary care physician as outpatient to determine if and when she can come off oxygen.  Patient may benefit from having outpatient pulmonary function tests if not done recently, defer to primary care physician.  Hyponatremia Etiology unclear, improved sodium was 130 prior to discharge was 119 initial on admission.  Suspect is likely due to hydrochlorothiazide.  Initially was hydrated on normal saline, normal saline discontinued on 07/22/2011. Patient to have BMET checked on 07/25/2011 and have results forwarded to our care physician to followup on sodium.   Hypokalemia Resolved with replacement.  Magnesium normal at 2.1.  Hypothyroidism TSH normal at 1.372 on 07/20/2011.  Continue levothyroxine.  Hypertension Stable.  On home medications, except hydrochlorothiazide discontinued for hyponatremia.  10 beat run of nonsustained V. Tach/wide-complex tachycardia Not symptomatic.  Likely due to electrolyte abnormalities, resolved with correction of the electrolytes.  Discussed with Dr. Myrtis Ser on April 21 of 2013, no further  evaluation this time.  Discontinued telemetry.  2-D echocardiogram on 07/21/2011 showed left ventricle septal dyssynergy, ejection fraction 55%, cavity size was normal, wall thickness was increased in the pattern of mild LVH,  Mechanical prosthetic aortic valve noted, left atrium was mildly dilated common ventricle cavity size was mildly dilated, pulmonary artery peak pressure 34 mm of  Hg, no significant change since 2012.  A. Fib Rate controlled.  On chronic anticoagulation.  Continue digoxin.  Status post aortic valve replacement with mechanical valve INR therapeutic.  Goal INR between 2.5-3.5 .  Continue to dose Coumadin per pharmacy.  Patient to have her PT/INR checked in 07/25/2011 and have results sent to her primary care physician for further dosing of Coumadin.  Generalized weakness Patient was evaluated by physical therapy with perhaps benefiting from home health.  Patient and family were more comfortable in having the patient go to skilled nursing facility which will be arranged at discharge.  CODE STATUS DO NOT RESUSCITATE/DO NOT INTUBATE.  Day of Discharge BP 129/73  Pulse 71  Temp(Src) 97.8 F (36.6 C) (Oral)  Resp 17  Ht 5' (1.524 m)  Wt 57.153 kg (126 lb)  BMI 24.61 kg/m2  SpO2 85%  Results for orders placed during the hospital encounter of 07/20/11 (from the past 48 hour(s))  BASIC METABOLIC PANEL     Status: Abnormal   Collection Time   07/21/11  1:25 PM      Component Value Range Comment   Sodium 122 (*) 135 - 145 (mEq/L)    Potassium 2.7 (*) 3.5 - 5.1 (mEq/L)    Chloride 83 (*) 96 - 112 (mEq/L)    CO2 30  19 - 32 (mEq/L)    Glucose, Bld 156 (*) 70 - 99 (mg/dL)    BUN 12  6 - 23 (mg/dL)    Creatinine, Ser 1.61  0.50 - 1.10 (mg/dL)    Calcium 8.9  8.4 - 10.5 (mg/dL)    GFR calc non Af Amer 82 (*) >90 (mL/min)    GFR calc Af Amer >90  >90 (mL/min)   PROTIME-INR     Status: Abnormal   Collection Time   07/22/11  5:00 AM      Component Value Range Comment    Prothrombin Time 24.4 (*) 11.6 - 15.2 (seconds)    INR 2.15 (*) 0.00 - 1.49    CBC     Status: Normal   Collection Time   07/22/11  5:00 AM      Component Value Range Comment   WBC 5.1  4.0 - 10.5 (K/uL)    RBC 4.40  3.87 - 5.11 (MIL/uL)    Hemoglobin 13.2  12.0 - 15.0 (g/dL)    HCT 09.6  04.5 - 40.9 (%)    MCV 92.0  78.0 - 100.0 (fL)    MCH 30.0  26.0 - 34.0 (pg)    MCHC 32.6  30.0 - 36.0 (g/dL)    RDW 81.1  91.4 - 78.2 (%)    Platelets 173  150 - 400 (K/uL)   BASIC METABOLIC PANEL     Status: Abnormal   Collection Time   07/22/11  5:00 AM      Component Value Range Comment   Sodium 126 (*) 135 - 145 (mEq/L)    Potassium 4.7  3.5 - 5.1 (mEq/L)    Chloride 87 (*) 96 - 112 (mEq/L)    CO2 30  19 - 32 (mEq/L)    Glucose, Bld 188 (*) 70 - 99 (mg/dL)    BUN 13  6 - 23 (mg/dL)    Creatinine, Ser 9.56  0.50 - 1.10 (mg/dL)    Calcium 9.3  8.4 - 10.5 (mg/dL)    GFR calc non Af Amer 78 (*) >90 (mL/min)    GFR calc Af Amer >90  >90 (mL/min)   MAGNESIUM  Status: Normal   Collection Time   07/22/11  5:00 AM      Component Value Range Comment   Magnesium 2.0  1.5 - 2.5 (mg/dL)   GLUCOSE, CAPILLARY     Status: Abnormal   Collection Time   07/22/11  8:40 AM      Component Value Range Comment   Glucose-Capillary 126 (*) 70 - 99 (mg/dL)    Comment 1 Notify RN     PROTIME-INR     Status: Abnormal   Collection Time   07/23/11  5:25 AM      Component Value Range Comment   Prothrombin Time 29.6 (*) 11.6 - 15.2 (seconds)    INR 2.76 (*) 0.00 - 1.49    BASIC METABOLIC PANEL     Status: Abnormal   Collection Time   07/23/11  5:25 AM      Component Value Range Comment   Sodium 130 (*) 135 - 145 (mEq/L)    Potassium 5.0  3.5 - 5.1 (mEq/L)    Chloride 92 (*) 96 - 112 (mEq/L)    CO2 34 (*) 19 - 32 (mEq/L)    Glucose, Bld 103 (*) 70 - 99 (mg/dL)    BUN 17  6 - 23 (mg/dL)    Creatinine, Ser 1.61  0.50 - 1.10 (mg/dL)    Calcium 9.2  8.4 - 10.5 (mg/dL)    GFR calc non Af Amer 80 (*) >90  (mL/min)    GFR calc Af Amer >90  >90 (mL/min)   GLUCOSE, CAPILLARY     Status: Normal   Collection Time   07/23/11  7:23 AM      Component Value Range Comment   Glucose-Capillary 91  70 - 99 (mg/dL)    Comment 1 Documented in Chart      Comment 2 Notify RN       Dg Chest 2 View  07/19/2011  *RADIOLOGY REPORT*  Clinical Data: Congestion and shortness of breath.  CHEST - 2 VIEW  Comparison: Two-view chest 04/30/2011.  Findings: Cardiac enlargement is stable.  There is interval increase in left basilar airspace disease, concerning for early infection.  The mild chronic interstitial coarsening is stable otherwise.  The patient is status post median sternotomy.  Right axillary node dissection is again noted.  IMPRESSION:  1.  New left basilar airspace disease is concerning for early infection. 2.  Stable chronic changes. 3.  Cardiomegaly without failure.  Original Report Authenticated By: Jamesetta Orleans. MATTERN, M.D.   Dg Abd Acute W/chest  07/20/2011  *RADIOLOGY REPORT*  Clinical Data: 78 year old female with shortness of breath, altered mental status, cough, nausea vomiting diarrhea.  ACUTE ABDOMEN SERIES (ABDOMEN 2 VIEW & CHEST 1 VIEW)  Comparison: Chest radiographs 07/19/2011.  CT abdomen and pelvis 12/29/2009.  Findings: Stable cardiomegaly and mediastinal contours.  Stable postoperative changes to the right axilla.  No pneumothorax or pneumoperitoneum.  Increased interstitial opacity appears to be chronic.  Lung bases are stable.  No abnormally dilated bowel loops in the abdomen or pelvis.  Some small bowel loops are somewhat featureless.  There is a paucity of colon gas.  Stable right upper quadrant surgical clips. No acute osseous abnormality identified.  IMPRESSION: 1. Nonobstructed bowel gas pattern, no free air.  Somewhat featureless appearance of the small bowel is nonspecific and might reflect enteritis. 2.  Chronic cardiomegaly and pulmonary interstitial changes.  Original Report  Authenticated By: Harley Hallmark, M.D.    Disposition: Skilled nursing facility  Diet: Heart  healthy  Activity: Resume as tolerated   Follow-up Appts: Discharge Orders    Future Appointments: Provider: Department: Dept Phone: Center:   10/07/2011 10:00 AM [facility] [phone] [id number]   10/08/2011 10:00 AM Cassell Clement, MD [facility] [phone] None     Future Orders Please Complete By Expires   Diet - low sodium heart healthy      Increase activity slowly      Discharge instructions      Comments:   Followup with Cassell Clement, MD (PCP) in 1-2 weeks. Please have PT/INR and BMET checked on 07/25/2011 and have results sent to Cassell Clement, MD (PCP) to have coumadin dose adjusted and to followup on sodium.  Please discuss with your primary care physician when and if he can come off oxygen.      TESTS THAT NEED FOLLOW-UP None  Time spent on discharge, talking to the patient, and coordinating care: 35 mins.  Signed: Cristal Ford, MD 07/23/2011, 11:59 AM

## 2011-07-25 ENCOUNTER — Encounter: Payer: Self-pay | Admitting: Cardiology

## 2011-07-31 ENCOUNTER — Telehealth: Payer: Self-pay | Admitting: Cardiology

## 2011-07-31 NOTE — Telephone Encounter (Signed)
Pt calling re medication °

## 2011-07-31 NOTE — Telephone Encounter (Signed)
Discussed medications with patient.  Patient just d/c from [facility] and medications changed either there or when she left hospital.  Will contunue current regimen until seen by  Dr. Patty Sermons next week

## 2011-08-08 ENCOUNTER — Ambulatory Visit (INDEPENDENT_AMBULATORY_CARE_PROVIDER_SITE_OTHER): Payer: Medicare Other | Admitting: Cardiology

## 2011-08-08 ENCOUNTER — Ambulatory Visit (INDEPENDENT_AMBULATORY_CARE_PROVIDER_SITE_OTHER): Payer: Medicare Other

## 2011-08-08 ENCOUNTER — Encounter: Payer: Self-pay | Admitting: Cardiology

## 2011-08-08 VITALS — BP 120/70 | HR 78 | Ht 60.0 in | Wt 130.0 lb

## 2011-08-08 DIAGNOSIS — E039 Hypothyroidism, unspecified: Secondary | ICD-10-CM

## 2011-08-08 DIAGNOSIS — Z7901 Long term (current) use of anticoagulants: Secondary | ICD-10-CM

## 2011-08-08 DIAGNOSIS — I4891 Unspecified atrial fibrillation: Secondary | ICD-10-CM

## 2011-08-08 DIAGNOSIS — I10 Essential (primary) hypertension: Secondary | ICD-10-CM

## 2011-08-08 DIAGNOSIS — I119 Hypertensive heart disease without heart failure: Secondary | ICD-10-CM

## 2011-08-08 DIAGNOSIS — I359 Nonrheumatic aortic valve disorder, unspecified: Secondary | ICD-10-CM

## 2011-08-08 NOTE — Progress Notes (Signed)
Deborah [name] Date of Birth:  1927/01/20 [facility] [address] [place], [place]  [address] [phone]         Fax   [phone]  History of Present Illness: This pleasant 78 year old woman is seen for a post hospital office visit.  She has a history of chronic atrial fibrillation on Coumadin.  She has a [employer] mechanical aortic valve replacement for severe aortic valve disease in 1996.  She does not have any history of ischemic heart disease and she had a normal nuclear stress test in March 2012.  She does have a chronically abnormal EKG with ST and T-wave abnormalities.  [name] had a history of high blood pressure.  She had normal renal arteriograms in January 2012.  She was recently hospitalized at [facility] for acute pneumonia obligated by delirium and electrolyte imbalance.  Following her hospitalization for pneumonia she went to the rehabilitation center at [facility] and improved rapidly there and is now back in her own home.  Current Outpatient Prescriptions  Medication Sig Dispense Refill  . acetaminophen (TYLENOL) 325 MG tablet Take 2 tablets (650 mg total) by mouth every 4 (four) hours as needed.      Marland Kitchen albuterol (PROVENTIL) (5 MG/ML) 0.5% nebulizer solution Take 0.5 mLs (2.5 mg total) by nebulization every 6 (six) hours as needed for wheezing or shortness of breath.  20 mL    . allopurinol (ZYLOPRIM) 300 MG tablet Take 1 tablet (300 mg total) by mouth daily.  30 tablet  6  . amLODipine (NORVASC) 5 MG tablet Take 1 tablet (5 mg total) by mouth daily.  30 tablet  0  . calcium-vitamin D 500 MG tablet Take 1 tablet by mouth 2 (two) times daily.        . celecoxib (CELEBREX) 200 MG capsule       . dicyclomine (BENTYL) 10 MG capsule Take 10 mg by mouth as needed. For stomach cramps      . fexofenadine (ALLEGRA ALLERGY) 180 MG tablet Take 180 mg by mouth daily. For allergies. As needed      . furosemide (LASIX) 40 MG tablet Take 1 tablet (40 mg  total) by mouth daily.  30 tablet  11  . LANOXIN 0.25 MG tablet TAKE ONE-HALF TABLET DAILY  30 tablet  11  . losartan (COZAAR) 100 MG tablet Take 100 mg by mouth daily.      . metoprolol tartrate (LOPRESSOR) 12.5 mg TABS Take 25 mg by mouth 3 (three) times daily.      . multivitamin (THERAGRAN) per tablet Take 1 tablet by mouth daily.        . nitroGLYCERIN (NITROSTAT) 0.4 MG SL tablet ONE TABLET UNDER TONGUE AS NEEDED  25 tablet  11  . potassium chloride SA (K-DUR,KLOR-CON) 20 MEQ tablet 2 (two) times daily.       . simvastatin (ZOCOR) 20 MG tablet 1/2 daily or as directed  30 tablet  11  . SYNTHROID 88 MCG tablet TAKE ONE TABLET EVERY DAY  30 tablet  11  . warfarin (COUMADIN) 5 MG tablet       . zolpidem (AMBIEN CR) 12.5 MG CR tablet TAKE ONE TABLET AT BEDTIME  Generic for AMBIEN C  30 tablet  4  . DISCONTD: metoprolol tartrate (LOPRESSOR) 12.5 mg TABS Take 0.5 tablets (12.5 mg total) by mouth 2 (two) times daily.  30 tablet  0  . ipratropium (ATROVENT) 0.02 % nebulizer solution Take 2.5 mLs (0.5 mg total) by nebulization every 6 (six) hours as needed.  75 mL    . lansoprazole (PREVACID) 30 MG capsule Take 30 mg by mouth daily.        . Omega-3 Fatty Acids (FISH OIL) 1000 MG CAPS Take by mouth 2 (two) times daily.        . predniSONE (DELTASONE) 20 MG tablet Take prednisone 20 mg daily for 2 days, 10 mg daily for 2 days, then 5 mg daily for 2 days then discontinue.        Allergies  Allergen Reactions  . Amiodarone Shortness Of Breath  . Alendronate Sodium     GI upset  . Ciprofloxacin     Rectal bleeding  . Macrodantin (Nitrofurantoin)     GI upset  . Meclizine Swelling    tongue  . Norvasc (Amlodipine Besylate)     edema  . Tussionex Pennkinetic Er (Hydrocod Polst-Cpm Polst Er)     Patient Active Problem List  Diagnoses  . Hypothyroidism  . Hypertension  . Atrial fibrillation  . S/P aortic valve replacement with metallic valve   . Hypokalemia  . Hyponatremia  . Bronchitis  . Nonsustained ventricular tachycardia  . Encounter for long-term (current) use of anticoagulants    History  Smoking status  . Former Smoker  . Quit date: 04/01/1988  Smokeless tobacco  . Not on file    History  Alcohol Use     Family History  Problem Relation Age of Onset  . Dementia Mother   . Coronary artery disease Father     Review of Systems: Constitutional: no fever chills diaphoresis or fatigue or change in weight.  Head and neck: no hearing loss, no epistaxis, no photophobia or visual disturbance. Respiratory: No cough, shortness of breath or wheezing. Cardiovascular: No chest pain peripheral edema, palpitations. Gastrointestinal: No abdominal distention, no abdominal pain, no change in bowel habits hematochezia or melena. Genitourinary: No dysuria, no frequency, no urgency, no nocturia. Musculoskeletal:No arthralgias, no back pain, no gait disturbance or myalgias. Neurological: No dizziness, no headaches, no numbness, no seizures, no syncope, no weakness, no tremors. Hematologic: No lymphadenopathy, no easy bruising. Psychiatric: No confusion, no hallucinations, no sleep disturbance.    Physical Exam: Filed Vitals:   08/08/11 1119  BP: 120/70  Pulse: 78   general appearance reveals a well-developed well-nourished woman in no distress.Pupils equal and reactive.   Extraocular Movements are full.  There is no scleral icterus.  The mouth and pharynx are normal.  The neck is supple.  The carotids reveal no bruits.  The jugular venous pressure is normal.  The thyroid is not enlarged.  There is no lymphadenopathy.  The chest is clear to percussion and auscultation. There are no rales or rhonchi. Expansion of the chest is symmetrical.  Heart reveals good aortic valve clicks.The abdomen is soft and nontender. Bowel sounds are normal. The liver and spleen are not enlarged. There Are no abdominal masses. There are no  bruits.  The pedal pulses are good.  There is no phlebitis or edema.  There is no cyanosis or clubbing. Strength is normal and symmetrical in all extremities.  There is no lateralizing weakness.  There are no sensory deficits.  The  skin is warm and dry.  There is no rash.    Assessment / Plan:  We will get time today.  She will return in one month for followup office visit.

## 2011-08-08 NOTE — Assessment & Plan Note (Signed)
He is clinically euthyroid on current therapy

## 2011-08-08 NOTE — Assessment & Plan Note (Signed)
She remains in atrial fibrillation with a controlled ventricular response.  She is on long-term Coumadin.  She was considering having her prothrombin times checked at [facility] but has decided to come back to our  Coumadin clinic office for protimes.  She will get a protime today.  Her goal is 2.5-3.5 because of her mechanical aortic valve prosthesis.

## 2011-08-08 NOTE — Patient Instructions (Signed)
Your physician recommends that you continue on your current medications as directed. Please refer to the Current Medication list given to you today.  Have INR checked today   Your physician recommends that you schedule a follow-up appointment in: 1 month

## 2011-08-08 NOTE — Assessment & Plan Note (Signed)
When she was hospitalized her medications were changed.  She has switched back to her previous medications which include tribenzor.  We will continue her current medications.  She is not having any chest pain or shortness of breath

## 2011-08-10 ENCOUNTER — Other Ambulatory Visit: Payer: Self-pay | Admitting: Cardiology

## 2011-08-12 NOTE — Telephone Encounter (Signed)
Refilled celebrex

## 2011-08-19 ENCOUNTER — Ambulatory Visit (INDEPENDENT_AMBULATORY_CARE_PROVIDER_SITE_OTHER): Payer: Medicare Other | Admitting: *Deleted

## 2011-08-19 DIAGNOSIS — Z7901 Long term (current) use of anticoagulants: Secondary | ICD-10-CM

## 2011-08-19 DIAGNOSIS — I359 Nonrheumatic aortic valve disorder, unspecified: Secondary | ICD-10-CM

## 2011-08-19 DIAGNOSIS — I4891 Unspecified atrial fibrillation: Secondary | ICD-10-CM

## 2011-08-20 ENCOUNTER — Other Ambulatory Visit: Payer: Self-pay | Admitting: Cardiology

## 2011-09-06 ENCOUNTER — Ambulatory Visit (INDEPENDENT_AMBULATORY_CARE_PROVIDER_SITE_OTHER): Payer: Medicare Other | Admitting: Cardiology

## 2011-09-06 ENCOUNTER — Encounter: Payer: Self-pay | Admitting: Cardiology

## 2011-09-06 ENCOUNTER — Telehealth: Payer: Self-pay | Admitting: Cardiology

## 2011-09-06 ENCOUNTER — Ambulatory Visit (INDEPENDENT_AMBULATORY_CARE_PROVIDER_SITE_OTHER): Payer: Medicare Other | Admitting: *Deleted

## 2011-09-06 VITALS — BP 128/80 | HR 70 | Ht 61.0 in | Wt 132.0 lb

## 2011-09-06 DIAGNOSIS — I4891 Unspecified atrial fibrillation: Secondary | ICD-10-CM

## 2011-09-06 DIAGNOSIS — G4700 Insomnia, unspecified: Secondary | ICD-10-CM

## 2011-09-06 DIAGNOSIS — I482 Chronic atrial fibrillation, unspecified: Secondary | ICD-10-CM | POA: Insufficient documentation

## 2011-09-06 DIAGNOSIS — I10 Essential (primary) hypertension: Secondary | ICD-10-CM

## 2011-09-06 DIAGNOSIS — Z954 Presence of other heart-valve replacement: Secondary | ICD-10-CM

## 2011-09-06 DIAGNOSIS — E7800 Pure hypercholesterolemia, unspecified: Secondary | ICD-10-CM

## 2011-09-06 DIAGNOSIS — Z7901 Long term (current) use of anticoagulants: Secondary | ICD-10-CM

## 2011-09-06 DIAGNOSIS — I359 Nonrheumatic aortic valve disorder, unspecified: Secondary | ICD-10-CM

## 2011-09-06 HISTORY — DX: Chronic atrial fibrillation, unspecified: I48.20

## 2011-09-06 MED ORDER — ZOLPIDEM TARTRATE ER 6.25 MG PO TBCR: EXTENDED_RELEASE_TABLET | ORAL | Status: DC

## 2011-09-06 NOTE — Telephone Encounter (Signed)
New problem:  Patient calling was seen this am. Patient on new medication will this be called in today.

## 2011-09-06 NOTE — Progress Notes (Signed)
[name] Date of Birth:  June 02, 1926 [facility] [address] [place], [place]  [address] [phone]  Fax   [phone]  HPI: This pleasant 78 year old woman is seen back for a scheduled followup office visit.  Since last visit she has recovered nicely from her previous acute pneumonia which caused her to be hospitalized for several days.  She has just a very slight residual cough but no fever and her energy level has returned back to normal.  He is not having any chest pain.  She has a history of injuries mechanical aortic valve replacement in 1996 and she has a history of chronic atrial fibrillation and is on Coumadin.  She does not have any ischemic heart disease and she had a normal nuclear stress test in March 2012.  She has not been experiencing any new cardiac symptoms.  Current Outpatient Prescriptions  Medication Sig Dispense Refill  . acetaminophen (TYLENOL) 325 MG tablet Take 2 tablets (650 mg total) by mouth every 4 (four) hours as needed.      Marland Kitchen allopurinol (ZYLOPRIM) 300 MG tablet Take 1 tablet (300 mg total) by mouth daily.  30 tablet  6  . calcium-vitamin D 500 MG tablet Take 1 tablet by mouth 2 (two) times daily.        . CELEBREX 200 MG capsule TAKE ONE CAPSULE DAILY AS NEEDED  30 each  6  . dicyclomine (BENTYL) 10 MG capsule Take 10 mg by mouth as needed. For stomach cramps      . fexofenadine (ALLEGRA ALLERGY) 180 MG tablet Take 180 mg by mouth daily. For allergies. As needed      . furosemide (LASIX) 40 MG tablet Take 1 tablet (40 mg total) by mouth daily.  30 tablet  11  . LANOXIN 0.25 MG tablet TAKE ONE-HALF TABLET DAILY  30 tablet  11  . lansoprazole (PREVACID) 30 MG capsule Take 30 mg by mouth daily.        . metoprolol tartrate (LOPRESSOR) 12.5 mg TABS Take 12.5 mg by mouth 3 (three) times daily.       . multivitamin (THERAGRAN) per tablet Take 1 tablet by mouth daily.        . nitroGLYCERIN (NITROSTAT) 0.4 MG SL tablet ONE TABLET  UNDER TONGUE AS NEEDED  25 tablet  11  . Omega-3 Fatty Acids (FISH OIL) 1000 MG CAPS Take by mouth 2 (two) times daily.        . potassium chloride SA (K-DUR,KLOR-CON) 20 MEQ tablet 2 (two) times daily.       . simvastatin (ZOCOR) 20 MG tablet 1/2 daily or as directed  30 tablet  11  . SYNTHROID 88 MCG tablet TAKE ONE TABLET EVERY DAY  30 tablet  11  . TRIBENZOR 40-5-12.5 MG TABS TAKE ONE TABLET EVERY DAY  30 tablet  6  . warfarin (COUMADIN) 5 MG tablet       . zolpidem (AMBIEN CR) 12.5 MG CR tablet TAKE ONE TABLET AT BEDTIME                                                  Generic for AMBIEN C  30 tablet  4    Allergies  Allergen Reactions  . Amiodarone Shortness Of Breath  . Alendronate Sodium     GI upset  . Ciprofloxacin  Rectal bleeding  . Macrodantin (Nitrofurantoin)     GI upset  . Meclizine Swelling    tongue  . Norvasc (Amlodipine Besylate)     edema  . Tussionex Pennkinetic Er (Hydrocod Polst-Cpm Polst Er)     Patient Active Problem List  Diagnoses  . Hypothyroidism  . Hypertension  . S/P aortic valve replacement with metallic valve  . Hypokalemia  . Hyponatremia  . Bronchitis  . Nonsustained ventricular tachycardia  . Encounter for long-term (current) use of anticoagulants  . Atrial fibrillation    History  Smoking status  . Former Smoker  . Quit date: 04/01/1988  Smokeless tobacco  . Not on file    History  Alcohol Use     Family History  Problem Relation Age of Onset  . Dementia Mother   . Coronary artery disease Father     Review of Systems: The patient denies any heat or cold intolerance.  No weight gain or weight loss.  The patient denies headaches or blurry vision.  There is no cough or sputum production.  The patient denies dizziness.  There is no hematuria or hematochezia.  The patient denies any muscle aches or arthritis.  The patient denies any rash.  The patient denies frequent falling or instability.  There is no history of  depression or anxiety.  All other systems were reviewed and are negative.   Physical Exam: Filed Vitals:   09/06/11 1114  BP: 128/80  Pulse: 70   the general appearance reveals a well-developed well-nourished woman in no distress.Pupils equal and reactive.   Extraocular Movements are full.  There is no scleral icterus.  The mouth and pharynx are normal.  The neck is supple.  The carotids reveal no bruits.  The jugular venous pressure is normal.  The thyroid is not enlarged.  There is no lymphadenopathy.  The chest is clear to percussion and auscultation. There are no rales or rhonchi. Expansion of the chest is symmetrical.  The heart reveals good opening and closing aortic valve clicks.  The rhythm is irregular.  No aortic insufficiency. The abdomen is soft and nontender. Bowel sounds are normal. The liver and spleen are not enlarged. There Are no abdominal masses. There are no bruits.  The pedal pulses are good.  There is no phlebitis or edema.  There is no cyanosis or clubbing. Strength is normal and symmetrical in all extremities.  There is no lateralizing weakness.  There are no sensory deficits.      Assessment / Plan:  Continue same medication.  Recheck in 3 months for followup office visit CBC TSH lipid panel hepatic function panel and basal metabolic panel.  Decrease dose of Ambien.

## 2011-09-06 NOTE — Assessment & Plan Note (Signed)
[name] had no TIA symptoms from her prosthetic aortic valve.  She remains on long-term Coumadin

## 2011-09-06 NOTE — Telephone Encounter (Signed)
Called Ambien to wrong pharmacy, called to correct pharmacy [name]

## 2011-09-06 NOTE — Assessment & Plan Note (Signed)
Blood pressure remained stable on current therapy.  No headaches or dizziness.  He does have chronic insomnia and we are adjusting her Ambien dose downward at her request.  She will take Ambien CR 6.25 mg each bedtime instead of Ambien CR 12.5

## 2011-09-06 NOTE — Assessment & Plan Note (Signed)
She has not had any awareness of rapid heart action or tachycardia.  No TIA or stroke symptoms.

## 2011-09-06 NOTE — Patient Instructions (Addendum)
Your physician recommends that you schedule a follow-up appointment in: 3 months with fasting labs (LP/BMET/HFP/TSH/CBC)  Will try decreasing you Ambien CR to 6.25 mg at bedtime, Rx called to pharmacy

## 2011-09-10 ENCOUNTER — Telehealth: Payer: Self-pay | Admitting: Cardiology

## 2011-09-10 NOTE — Telephone Encounter (Signed)
Spoke with patient regarding Ambien.  Patient is going to decrease to 6.26 mg at bedtime but wasn't sure if that was going to work for her. Rx had been sent over to [facility] for 1 to 2 at bedtime but needed quantity override for 60 tablets.  Spoke with patient and she will just get #30 at a time for now and let us know how she does.  Will call back if needs anything.  Advised [facility]

## 2011-09-10 NOTE — Telephone Encounter (Signed)
New Problem:     Patient returned your phone call.  Please call back. 

## 2011-09-20 ENCOUNTER — Ambulatory Visit (INDEPENDENT_AMBULATORY_CARE_PROVIDER_SITE_OTHER): Payer: Medicare Other | Admitting: *Deleted

## 2011-09-20 DIAGNOSIS — I4891 Unspecified atrial fibrillation: Secondary | ICD-10-CM

## 2011-09-20 DIAGNOSIS — I359 Nonrheumatic aortic valve disorder, unspecified: Secondary | ICD-10-CM

## 2011-09-20 DIAGNOSIS — Z7901 Long term (current) use of anticoagulants: Secondary | ICD-10-CM

## 2011-09-20 LAB — POCT INR: INR: 3.8

## 2011-09-25 ENCOUNTER — Other Ambulatory Visit: Payer: Self-pay | Admitting: Cardiology

## 2011-09-25 NOTE — Telephone Encounter (Signed)
Refilled allopurinol and warfarin

## 2011-10-07 ENCOUNTER — Other Ambulatory Visit: Payer: Medicare Other

## 2011-10-08 ENCOUNTER — Ambulatory Visit: Payer: Medicare Other | Admitting: Cardiology

## 2011-10-10 ENCOUNTER — Other Ambulatory Visit: Payer: Self-pay | Admitting: Cardiology

## 2011-10-11 ENCOUNTER — Ambulatory Visit (INDEPENDENT_AMBULATORY_CARE_PROVIDER_SITE_OTHER): Payer: Medicare Other | Admitting: *Deleted

## 2011-10-11 DIAGNOSIS — Z7901 Long term (current) use of anticoagulants: Secondary | ICD-10-CM

## 2011-10-11 DIAGNOSIS — I359 Nonrheumatic aortic valve disorder, unspecified: Secondary | ICD-10-CM

## 2011-10-11 DIAGNOSIS — I4891 Unspecified atrial fibrillation: Secondary | ICD-10-CM

## 2011-10-11 LAB — POCT INR: INR: 3

## 2011-11-01 ENCOUNTER — Ambulatory Visit (INDEPENDENT_AMBULATORY_CARE_PROVIDER_SITE_OTHER): Payer: Medicare Other

## 2011-11-01 DIAGNOSIS — I4891 Unspecified atrial fibrillation: Secondary | ICD-10-CM

## 2011-11-01 DIAGNOSIS — I359 Nonrheumatic aortic valve disorder, unspecified: Secondary | ICD-10-CM

## 2011-11-01 DIAGNOSIS — Z7901 Long term (current) use of anticoagulants: Secondary | ICD-10-CM

## 2011-11-01 LAB — POCT INR: INR: 3.2

## 2011-11-06 ENCOUNTER — Other Ambulatory Visit: Payer: Self-pay | Admitting: *Deleted

## 2011-11-06 DIAGNOSIS — G4700 Insomnia, unspecified: Secondary | ICD-10-CM

## 2011-11-07 MED ORDER — ZOLPIDEM TARTRATE ER 6.25 MG PO TBCR: EXTENDED_RELEASE_TABLET | ORAL | Status: DC

## 2011-11-07 NOTE — Telephone Encounter (Signed)
Refilled zolpidem.

## 2011-11-08 ENCOUNTER — Telehealth: Payer: Self-pay | Admitting: *Deleted

## 2011-11-08 ENCOUNTER — Other Ambulatory Visit: Payer: Self-pay | Admitting: Cardiology

## 2011-11-08 DIAGNOSIS — J329 Chronic sinusitis, unspecified: Secondary | ICD-10-CM

## 2011-11-08 DIAGNOSIS — G4700 Insomnia, unspecified: Secondary | ICD-10-CM

## 2011-11-08 MED ORDER — AZITHROMYCIN 250 MG PO TABS: ORAL_TABLET | ORAL | Status: AC

## 2011-11-08 MED ORDER — ZOLPIDEM TARTRATE ER 12.5 MG PO TBCR: 12.5000 mg | EXTENDED_RELEASE_TABLET | Freq: Every evening | ORAL | Status: DC | PRN

## 2011-11-08 NOTE — Telephone Encounter (Signed)
Patient called complaining of sinus infection (ear pain, head congestion). Discussed with  Dr. Patty Sermons and will Rx Zpak.  Will decrease warfarin to 2.5 mg daily while taking and check INR Tuesday  Patient also complaining of Ambien 6.25 mg not helping, has to take two.  Rx'd called in for 12.5 mg daily (previous dose)

## 2011-11-08 NOTE — Telephone Encounter (Signed)
New problem:   has general questions medication, felt it was best to explain to nurse when she call.

## 2011-11-08 NOTE — Telephone Encounter (Signed)
Patient called complaining of sinus infection (ear pain, head congestion). Discussed with  Dr. Patty Sermons and will Rx Zpak.  Will decrease warfarin to 2.5 mg daily while taking and check INR Tuesday  Patient also complaining of Ambien 6.25 mg not helping, has to take two.  Rx'd called in for 12.5 mg daily (previous dose)  Patient called in with these complaints, advised patient

## 2011-11-11 NOTE — Telephone Encounter (Signed)
Advised patient and is coming for protime tomorrow

## 2011-11-11 NOTE — Telephone Encounter (Signed)
Patient returning nurse call, she can be reached at [phone]

## 2011-11-12 ENCOUNTER — Ambulatory Visit (INDEPENDENT_AMBULATORY_CARE_PROVIDER_SITE_OTHER): Payer: Medicare Other | Admitting: *Deleted

## 2011-11-12 DIAGNOSIS — I359 Nonrheumatic aortic valve disorder, unspecified: Secondary | ICD-10-CM

## 2011-11-12 DIAGNOSIS — I4891 Unspecified atrial fibrillation: Secondary | ICD-10-CM

## 2011-11-12 DIAGNOSIS — Z7901 Long term (current) use of anticoagulants: Secondary | ICD-10-CM

## 2011-11-12 LAB — POCT INR: INR: 1.8

## 2011-11-19 ENCOUNTER — Ambulatory Visit (INDEPENDENT_AMBULATORY_CARE_PROVIDER_SITE_OTHER): Payer: Medicare Other

## 2011-11-19 DIAGNOSIS — Z7901 Long term (current) use of anticoagulants: Secondary | ICD-10-CM

## 2011-11-19 DIAGNOSIS — I359 Nonrheumatic aortic valve disorder, unspecified: Secondary | ICD-10-CM

## 2011-11-19 DIAGNOSIS — I4891 Unspecified atrial fibrillation: Secondary | ICD-10-CM

## 2011-11-19 LAB — POCT INR: INR: 2

## 2011-12-04 ENCOUNTER — Ambulatory Visit (INDEPENDENT_AMBULATORY_CARE_PROVIDER_SITE_OTHER): Payer: Medicare Other | Admitting: *Deleted

## 2011-12-04 ENCOUNTER — Ambulatory Visit (INDEPENDENT_AMBULATORY_CARE_PROVIDER_SITE_OTHER): Payer: Medicare Other | Admitting: Cardiology

## 2011-12-04 ENCOUNTER — Encounter: Payer: Self-pay | Admitting: Cardiology

## 2011-12-04 ENCOUNTER — Other Ambulatory Visit (INDEPENDENT_AMBULATORY_CARE_PROVIDER_SITE_OTHER): Payer: Medicare Other

## 2011-12-04 VITALS — BP 120/78 | HR 68 | Ht 61.0 in | Wt 130.0 lb

## 2011-12-04 DIAGNOSIS — I119 Hypertensive heart disease without heart failure: Secondary | ICD-10-CM

## 2011-12-04 DIAGNOSIS — Z79899 Other long term (current) drug therapy: Secondary | ICD-10-CM

## 2011-12-04 DIAGNOSIS — J3489 Other specified disorders of nose and nasal sinuses: Secondary | ICD-10-CM

## 2011-12-04 DIAGNOSIS — E785 Hyperlipidemia, unspecified: Secondary | ICD-10-CM

## 2011-12-04 DIAGNOSIS — I4891 Unspecified atrial fibrillation: Secondary | ICD-10-CM

## 2011-12-04 DIAGNOSIS — Z952 Presence of prosthetic heart valve: Secondary | ICD-10-CM

## 2011-12-04 DIAGNOSIS — Z954 Presence of other heart-valve replacement: Secondary | ICD-10-CM

## 2011-12-04 DIAGNOSIS — Z7901 Long term (current) use of anticoagulants: Secondary | ICD-10-CM

## 2011-12-04 DIAGNOSIS — E039 Hypothyroidism, unspecified: Secondary | ICD-10-CM

## 2011-12-04 DIAGNOSIS — I359 Nonrheumatic aortic valve disorder, unspecified: Secondary | ICD-10-CM

## 2011-12-04 DIAGNOSIS — E7800 Pure hypercholesterolemia, unspecified: Secondary | ICD-10-CM

## 2011-12-04 DIAGNOSIS — I10 Essential (primary) hypertension: Secondary | ICD-10-CM

## 2011-12-04 LAB — CBC WITH DIFFERENTIAL/PLATELET
Basophils Absolute: 0 10*3/uL (ref 0.0–0.1)
Eosinophils Absolute: 0 10*3/uL (ref 0.0–0.7)
Lymphocytes Relative: 20.3 % (ref 12.0–46.0)
MCHC: 30.5 g/dL (ref 30.0–36.0)
MCV: 79 fl (ref 78.0–100.0)
Monocytes Absolute: 0.4 10*3/uL (ref 0.1–1.0)
Neutrophils Relative %: 68.6 % (ref 43.0–77.0)
RDW: 16.4 % — ABNORMAL HIGH (ref 11.5–14.6)

## 2011-12-04 LAB — HEPATIC FUNCTION PANEL
AST: 22 U/L (ref 0–37)
Alkaline Phosphatase: 49 U/L (ref 39–117)
Bilirubin, Direct: 0.1 mg/dL (ref 0.0–0.3)
Total Bilirubin: 0.7 mg/dL (ref 0.3–1.2)

## 2011-12-04 LAB — BASIC METABOLIC PANEL
BUN: 15 mg/dL (ref 6–23)
CO2: 28 mEq/L (ref 19–32)
Calcium: 9.5 mg/dL (ref 8.4–10.5)
Chloride: 95 mEq/L — ABNORMAL LOW (ref 96–112)
Creatinine, Ser: 0.6 mg/dL (ref 0.4–1.2)
Glucose, Bld: 88 mg/dL (ref 70–99)

## 2011-12-04 LAB — LIPID PANEL
Total CHOL/HDL Ratio: 3
Triglycerides: 112 mg/dL (ref 0.0–149.0)

## 2011-12-04 NOTE — Progress Notes (Signed)
Deborah [name] Date of Birth:  Sep 30, 1926 [facility] [address] [name], [place]  [address] [phone]         Fax   [phone]  History of Present Illness: This pleasant 78 year old woman is seen back for a scheduled followup office visit. . She has a history of previous mechanical aortic valve replacement in 1996 and she has a history of chronic atrial fibrillation and is on Coumadin. She does not have any ischemic heart disease and she had a normal nuclear stress test in March 2012. She has not been experiencing any new cardiac symptoms.  She does have a past history of high blood pressure.   Current Outpatient Prescriptions  Medication Sig Dispense Refill  . acetaminophen (TYLENOL) 325 MG tablet Take 2 tablets (650 mg total) by mouth every 4 (four) hours as needed.      Marland Kitchen allopurinol (ZYLOPRIM) 300 MG tablet TAKE ONE TABLET EVERY DAY  30 tablet  11  . calcium-vitamin D 500 MG tablet Take 1 tablet by mouth 2 (two) times daily.        . CELEBREX 200 MG capsule TAKE ONE CAPSULE DAILY AS NEEDED  30 each  6  . COUMADIN 5 MG tablet TAKE AS DIRECTED  30 tablet  11  . dicyclomine (BENTYL) 10 MG capsule Take 10 mg by mouth as needed. For stomach cramps      . fexofenadine (ALLEGRA ALLERGY) 180 MG tablet Take 180 mg by mouth daily. For allergies. As needed      . furosemide (LASIX) 40 MG tablet Take 1 tablet (40 mg total) by mouth daily.  30 tablet  11  . LANOXIN 0.25 MG tablet TAKE ONE-HALF TABLET DAILY  30 tablet  11  . lansoprazole (PREVACID) 30 MG capsule Take 30 mg by mouth daily.        . metoprolol tartrate (LOPRESSOR) 12.5 mg TABS Take 12.5 mg by mouth 3 (three) times daily.       . multivitamin (THERAGRAN) per tablet Take 1 tablet by mouth daily.        . nitroGLYCERIN (NITROSTAT) 0.4 MG SL tablet ONE TABLET UNDER TONGUE AS NEEDED  25 tablet  11  . Omega-3 Fatty Acids (FISH OIL) 1000 MG CAPS Take by mouth 2 (two) times daily.        . potassium chloride  SA (K-DUR,KLOR-CON) 20 MEQ tablet 2 (two) times daily.       . simvastatin (ZOCOR) 20 MG tablet TAKE ONE-HALF TABLET DAILY OR AS        DIRECTED  30 tablet  10  . SYNTHROID 88 MCG tablet TAKE ONE TABLET EVERY DAY  30 tablet  11  . TRIBENZOR 40-5-12.5 MG TABS TAKE ONE TABLET EVERY DAY  30 tablet  6  . zolpidem (AMBIEN CR) 12.5 MG CR tablet Take 1 tablet (12.5 mg total) by mouth at bedtime as needed for sleep.  30 tablet  3    Allergies  Allergen Reactions  . Amiodarone Shortness Of Breath  . Alendronate Sodium     GI upset  . Ciprofloxacin     Rectal bleeding  . Macrodantin (Nitrofurantoin)     GI upset  . Meclizine Swelling    tongue  . Norvasc (Amlodipine Besylate)     edema  . Tussionex Pennkinetic Er (Hydrocod Polst-Cpm Polst Er)     Patient Active Problem List  Diagnosis  . Hypothyroidism  . Hypertension  . S/P aortic valve replacement with metallic valve  .  Hypokalemia  . Hyponatremia  . Bronchitis  . Nonsustained ventricular tachycardia  . Encounter for long-term (current) use of anticoagulants  . Atrial fibrillation    History  Smoking status  . Former Smoker  . Quit date: 04/01/1988  Smokeless tobacco  . Not on file    History  Alcohol Use     Family History  Problem Relation Age of Onset  . Dementia Mother   . Coronary artery disease Father     Review of Systems: Constitutional: no fever chills diaphoresis or fatigue or change in weight.  Head and neck: no hearing loss, no epistaxis, no photophobia or visual disturbance. Respiratory: No cough, shortness of breath or wheezing. Cardiovascular: No chest pain peripheral edema, palpitations. Gastrointestinal: No abdominal distention, no abdominal pain, no change in bowel habits hematochezia or melena. Genitourinary: No dysuria, no frequency, no urgency, no nocturia. Musculoskeletal:No arthralgias, no back pain, no gait disturbance or myalgias. Neurological: No dizziness, no headaches, no numbness, no  seizures, no syncope, no weakness, no tremors. Hematologic: No lymphadenopathy, no easy bruising. Psychiatric: No confusion, no hallucinations, no sleep disturbance.    Physical Exam: Filed Vitals:   12/04/11 1127  BP: 120/78  Pulse: 68   the general appearance reveals a well-developed well-nourished alert elderly woman in no distress.Pupils equal and reactive.   Extraocular Movements are full.  There is no scleral icterus.  The mouth and pharynx are normal.  The neck is supple.  The carotids reveal no bruits.  The jugular venous pressure is normal.  The thyroid is not enlarged.  There is no lymphadenopathy.  The chest is clear to percussion and auscultation. There are no rales or rhonchi. Expansion of the chest is symmetrical.  The heart reveals good aortic metallic opening and closing clicks and no aortic insufficiency.  There is a grade 2/6 harsh systolic ejection murmur across the prosthetic mitral valve.  There is no gallop or rub. The abdomen is soft and nontender.  The liver is not enlarged.  There is no splenomegaly.  Bowel sounds are normal. Extremities show no phlebitis or edema.  Pulses are present. The skin is warm and dry.  There is no rash.Strength is normal and symmetrical in all extremities.  There is no lateralizing weakness.  There are no sensory deficits.      Assessment / Plan: Continue same medication.  Recheck in 3 months for followup office visit EKG CBC and basal metabolic panel.

## 2011-12-04 NOTE — Assessment & Plan Note (Signed)
Her blood pressure has been under reasonably good control recently.  She is not having any headaches or dizzy spells

## 2011-12-04 NOTE — Patient Instructions (Addendum)
Your physician recommends that you continue on your current medications as directed. Please refer to the Current Medication list given to you today.   Your physician recommends that you schedule a follow-up appointment in: 3 months ov/cbc/bmet/ekg

## 2011-12-04 NOTE — Assessment & Plan Note (Signed)
The patient has a history of established chronic atrial fibrillation.  She has not been experiencing any TIA symptoms .

## 2011-12-04 NOTE — Assessment & Plan Note (Signed)
The patient has been feeling fair.  She has had some increased shortness of breath at times.  She continues to feel weak from a recurring stomach ailment for which she is being treated by Dr. Ewing Schlein.  The patient states that he is concerned that she may have adhesions causing her intermittent abdominal discomfort and low-grade fever and chills.

## 2011-12-04 NOTE — Assessment & Plan Note (Signed)
The patient has a history of dyslipidemia.  She is on low-dose Zocor.  [name] not having any myalgias.  Blood work today is pending

## 2011-12-05 ENCOUNTER — Telehealth: Payer: Self-pay | Admitting: *Deleted

## 2011-12-05 DIAGNOSIS — R5383 Other fatigue: Secondary | ICD-10-CM

## 2011-12-05 DIAGNOSIS — Z79899 Other long term (current) drug therapy: Secondary | ICD-10-CM

## 2011-12-05 DIAGNOSIS — D649 Anemia, unspecified: Secondary | ICD-10-CM

## 2011-12-05 DIAGNOSIS — R5381 Other malaise: Secondary | ICD-10-CM

## 2011-12-05 NOTE — Telephone Encounter (Signed)
Advised patient of lab results and scheduled labs 

## 2011-12-05 NOTE — Telephone Encounter (Signed)
Message copied by Burnell Blanks on Thu Dec 05, 2011  4:40 PM ------      Message from: Cassell Clement      Created: Wed Dec 04, 2011  7:17 PM       Labs stable except she has become anemic since April.  I would like her to get an anemia panel to evaluate type of anemia. Has she noticed any blood in her stools? If she turns out to be iron deficient she should go back to see her GI to rule out GI blood losses.

## 2011-12-06 ENCOUNTER — Other Ambulatory Visit (INDEPENDENT_AMBULATORY_CARE_PROVIDER_SITE_OTHER): Payer: Medicare Other

## 2011-12-06 DIAGNOSIS — D649 Anemia, unspecified: Secondary | ICD-10-CM

## 2011-12-06 DIAGNOSIS — Z79899 Other long term (current) drug therapy: Secondary | ICD-10-CM

## 2011-12-06 DIAGNOSIS — R5381 Other malaise: Secondary | ICD-10-CM

## 2011-12-06 DIAGNOSIS — R0989 Other specified symptoms and signs involving the circulatory and respiratory systems: Secondary | ICD-10-CM

## 2011-12-06 LAB — FOLATE: Folate: >20 ng/mL

## 2011-12-06 LAB — IRON AND TIBC
%SAT: 6 % — ABNORMAL LOW (ref 20–55)
TIBC: 535 ug/dL — ABNORMAL HIGH (ref 250–470)

## 2011-12-06 LAB — VITAMIN B12: Vitamin B-12: 425 pg/mL (ref 211–911)

## 2011-12-10 ENCOUNTER — Telehealth: Payer: Self-pay | Admitting: Cardiology

## 2011-12-10 ENCOUNTER — Telehealth: Payer: Self-pay | Admitting: *Deleted

## 2011-12-10 MED ORDER — POLYSACCHARIDE IRON COMPLEX 150 MG PO CAPS: 150.0000 mg | ORAL_CAPSULE | Freq: Two times a day (BID) | ORAL | Status: DC

## 2011-12-10 NOTE — Telephone Encounter (Signed)
Received a call from [facility] and Niferex not available.  They do have poly iron advised ok to dispense that instead.

## 2011-12-10 NOTE — Telephone Encounter (Signed)
Ok

## 2011-12-10 NOTE — Telephone Encounter (Signed)
Pt calling re results of bloodwork, has no energy, wants to know what to do?

## 2011-12-10 NOTE — Telephone Encounter (Signed)
Labs reviewed and iron low.  Patient will follow up with Dr Darnelle Catalan (GI MD).  Discussed with Dawayne Patricia. NP and will have her start Niferex 150 mg twice a day until she can follow up with GI. Rx called to [facility] for delivery.

## 2011-12-23 ENCOUNTER — Ambulatory Visit (INDEPENDENT_AMBULATORY_CARE_PROVIDER_SITE_OTHER): Payer: Medicare Other | Admitting: *Deleted

## 2011-12-23 ENCOUNTER — Other Ambulatory Visit: Payer: Self-pay | Admitting: *Deleted

## 2011-12-23 ENCOUNTER — Other Ambulatory Visit (HOSPITAL_COMMUNITY): Payer: Self-pay | Admitting: *Deleted

## 2011-12-23 ENCOUNTER — Other Ambulatory Visit: Payer: Medicare Other

## 2011-12-23 ENCOUNTER — Other Ambulatory Visit (INDEPENDENT_AMBULATORY_CARE_PROVIDER_SITE_OTHER): Payer: Medicare Other

## 2011-12-23 DIAGNOSIS — I4891 Unspecified atrial fibrillation: Secondary | ICD-10-CM

## 2011-12-23 DIAGNOSIS — R0602 Shortness of breath: Secondary | ICD-10-CM

## 2011-12-23 DIAGNOSIS — Z79899 Other long term (current) drug therapy: Secondary | ICD-10-CM

## 2011-12-23 DIAGNOSIS — I359 Nonrheumatic aortic valve disorder, unspecified: Secondary | ICD-10-CM

## 2011-12-23 DIAGNOSIS — Z7901 Long term (current) use of anticoagulants: Secondary | ICD-10-CM

## 2011-12-23 LAB — POCT INR: INR: 4.5

## 2011-12-23 LAB — CBC WITH DIFFERENTIAL/PLATELET
Basophils Absolute: 0 10*3/uL (ref 0.0–0.1)
Basophils Relative: 0.5 % (ref 0.0–3.0)
Eosinophils Absolute: 0 10*3/uL (ref 0.0–0.7)
HCT: 25.3 % — ABNORMAL LOW (ref 36.0–46.0)
Hemoglobin: 7.6 g/dL — CL (ref 12.0–15.0)
Lymphocytes Relative: 13.9 % (ref 12.0–46.0)
Lymphs Abs: 0.8 10*3/uL (ref 0.7–4.0)
MCHC: 30.2 g/dL (ref 30.0–36.0)
MCV: 77.3 fl — ABNORMAL LOW (ref 78.0–100.0)
Monocytes Absolute: 0.6 10*3/uL (ref 0.1–1.0)
Neutro Abs: 4.4 10*3/uL (ref 1.4–7.7)
RBC: 3.27 Mil/uL — ABNORMAL LOW (ref 3.87–5.11)
RDW: 17.5 % — ABNORMAL HIGH (ref 11.5–14.6)

## 2011-12-24 ENCOUNTER — Encounter (HOSPITAL_COMMUNITY)
Admission: RE | Admit: 2011-12-24 | Discharge: 2011-12-24 | Disposition: A | Discharge status: Home / Self Care | Payer: Medicare Other | Source: Ambulatory Visit | Attending: Nurse Practitioner | Admitting: Nurse Practitioner

## 2011-12-24 DIAGNOSIS — D649 Anemia, unspecified: Secondary | ICD-10-CM | POA: Insufficient documentation

## 2011-12-24 LAB — ABO/RH: ABO/RH(D): O POS

## 2011-12-24 LAB — BASIC METABOLIC PANEL
CO2: 29 mEq/L (ref 19–32)
Calcium: 8.5 mg/dL (ref 8.4–10.5)
Chloride: 97 mEq/L (ref 96–112)
Glucose, Bld: 104 mg/dL — ABNORMAL HIGH (ref 70–99)
Sodium: 132 mEq/L — ABNORMAL LOW (ref 135–145)

## 2011-12-24 LAB — PREPARE RBC (CROSSMATCH)

## 2011-12-24 MED ORDER — FUROSEMIDE 10 MG/ML IJ SOLN
20.0000 mg | Freq: Once | INTRAMUSCULAR | Status: AC
  Administered 2011-12-24: 20 mg via INTRAVENOUS
  Filled 2011-12-24: qty 2

## 2011-12-25 ENCOUNTER — Encounter: Payer: Self-pay | Admitting: Pharmacist

## 2011-12-25 LAB — TYPE AND SCREEN
ABO/RH(D): O POS
Antibody Screen: NEGATIVE
Unit division: 0
Unit division: 0

## 2011-12-26 ENCOUNTER — Other Ambulatory Visit: Payer: Self-pay | Admitting: Gastroenterology

## 2011-12-26 ENCOUNTER — Encounter: Payer: Self-pay | Admitting: Cardiology

## 2011-12-26 DIAGNOSIS — D649 Anemia, unspecified: Secondary | ICD-10-CM

## 2011-12-26 DIAGNOSIS — R634 Abnormal weight loss: Secondary | ICD-10-CM

## 2011-12-26 DIAGNOSIS — R109 Unspecified abdominal pain: Secondary | ICD-10-CM

## 2011-12-27 ENCOUNTER — Telehealth: Payer: Self-pay | Admitting: Cardiology

## 2011-12-27 DIAGNOSIS — D649 Anemia, unspecified: Secondary | ICD-10-CM

## 2011-12-27 NOTE — Telephone Encounter (Signed)
Pt has to go out so can you rtn the call as soon as you can

## 2011-12-27 NOTE — Telephone Encounter (Signed)
Patient did get blood transfusion and saw Dr Ewing Schlein.  Will recheck labs on 10/4 when she comes for INR

## 2011-12-30 ENCOUNTER — Telehealth: Payer: Self-pay | Admitting: *Deleted

## 2011-12-30 NOTE — Telephone Encounter (Signed)
Patient saw GI last week

## 2011-12-30 NOTE — Telephone Encounter (Signed)
Message copied by Burnell Blanks on Mon Dec 30, 2011  3:35 PM ------      Message from: Cassell Clement      Created: Tue Dec 24, 2011  8:18 PM       CBC discussed with Juliette Alcide.  Plans for transfusion and for GI workup

## 2011-12-31 ENCOUNTER — Ambulatory Visit
Admission: RE | Admit: 2011-12-31 | Discharge: 2011-12-31 | Disposition: A | Discharge status: Home / Self Care | Payer: Medicare Other | Source: Ambulatory Visit | Attending: Gastroenterology | Admitting: Gastroenterology

## 2011-12-31 DIAGNOSIS — R634 Abnormal weight loss: Secondary | ICD-10-CM

## 2011-12-31 DIAGNOSIS — R109 Unspecified abdominal pain: Secondary | ICD-10-CM

## 2011-12-31 DIAGNOSIS — D649 Anemia, unspecified: Secondary | ICD-10-CM

## 2011-12-31 MED ORDER — IOHEXOL 300 MG/ML  SOLN
100.0000 mL | Freq: Once | INTRAMUSCULAR | Status: AC | PRN
  Administered 2011-12-31: 100 mL via INTRAVENOUS

## 2012-01-03 ENCOUNTER — Ambulatory Visit (INDEPENDENT_AMBULATORY_CARE_PROVIDER_SITE_OTHER): Payer: Medicare Other | Admitting: *Deleted

## 2012-01-03 ENCOUNTER — Other Ambulatory Visit (INDEPENDENT_AMBULATORY_CARE_PROVIDER_SITE_OTHER): Payer: Medicare Other

## 2012-01-03 DIAGNOSIS — Z954 Presence of other heart-valve replacement: Secondary | ICD-10-CM

## 2012-01-03 DIAGNOSIS — I359 Nonrheumatic aortic valve disorder, unspecified: Secondary | ICD-10-CM

## 2012-01-03 DIAGNOSIS — D649 Anemia, unspecified: Secondary | ICD-10-CM

## 2012-01-03 DIAGNOSIS — I4891 Unspecified atrial fibrillation: Secondary | ICD-10-CM

## 2012-01-03 DIAGNOSIS — Z7901 Long term (current) use of anticoagulants: Secondary | ICD-10-CM

## 2012-01-03 LAB — CBC WITH DIFFERENTIAL/PLATELET
Basophils Relative: 0.5 % (ref 0.0–3.0)
Eosinophils Absolute: 0 10*3/uL (ref 0.0–0.7)
HCT: 35.9 % — ABNORMAL LOW (ref 36.0–46.0)
Hemoglobin: 11 g/dL — ABNORMAL LOW (ref 12.0–15.0)
Lymphocytes Relative: 19.2 % (ref 12.0–46.0)
Lymphs Abs: 1.1 10*3/uL (ref 0.7–4.0)
MCHC: 30.7 g/dL (ref 30.0–36.0)
MCV: 82.7 fl (ref 78.0–100.0)
Monocytes Absolute: 0.5 10*3/uL (ref 0.1–1.0)
Neutro Abs: 3.9 10*3/uL (ref 1.4–7.7)
RBC: 4.34 Mil/uL (ref 3.87–5.11)

## 2012-01-06 ENCOUNTER — Telehealth: Payer: Self-pay | Admitting: Cardiology

## 2012-01-06 NOTE — Telephone Encounter (Signed)
Advised patient Hgb up to 11.0 and sent to Dr Ewing Schlein

## 2012-01-06 NOTE — Telephone Encounter (Signed)
New message:  Pt would like to get results of lab test done on Friday.

## 2012-01-15 ENCOUNTER — Telehealth: Payer: Self-pay | Admitting: *Deleted

## 2012-01-15 DIAGNOSIS — D62 Acute posthemorrhagic anemia: Secondary | ICD-10-CM

## 2012-01-15 DIAGNOSIS — D649 Anemia, unspecified: Secondary | ICD-10-CM

## 2012-01-15 NOTE — Telephone Encounter (Signed)
Patient phoned requesting a CBC on Friday, ok to get.  Put order in system

## 2012-01-17 ENCOUNTER — Other Ambulatory Visit (INDEPENDENT_AMBULATORY_CARE_PROVIDER_SITE_OTHER): Payer: Medicare Other

## 2012-01-17 ENCOUNTER — Ambulatory Visit (INDEPENDENT_AMBULATORY_CARE_PROVIDER_SITE_OTHER): Payer: Medicare Other

## 2012-01-17 DIAGNOSIS — D649 Anemia, unspecified: Secondary | ICD-10-CM

## 2012-01-17 DIAGNOSIS — I359 Nonrheumatic aortic valve disorder, unspecified: Secondary | ICD-10-CM

## 2012-01-17 DIAGNOSIS — Z7901 Long term (current) use of anticoagulants: Secondary | ICD-10-CM

## 2012-01-17 DIAGNOSIS — Z954 Presence of other heart-valve replacement: Secondary | ICD-10-CM

## 2012-01-17 DIAGNOSIS — I4891 Unspecified atrial fibrillation: Secondary | ICD-10-CM

## 2012-01-17 LAB — CBC WITH DIFFERENTIAL/PLATELET
Basophils Relative: 0.4 % (ref 0.0–3.0)
Eosinophils Absolute: 0 10*3/uL (ref 0.0–0.7)
MCHC: 31.1 g/dL (ref 30.0–36.0)
MCV: 86.8 fl (ref 78.0–100.0)
Monocytes Absolute: 0.4 10*3/uL (ref 0.1–1.0)
Neutrophils Relative %: 72.1 % (ref 43.0–77.0)
RBC: 3.78 Mil/uL — ABNORMAL LOW (ref 3.87–5.11)
RDW: 27.8 % — ABNORMAL HIGH (ref 11.5–14.6)

## 2012-01-20 ENCOUNTER — Encounter: Payer: Self-pay | Admitting: Cardiology

## 2012-01-20 ENCOUNTER — Telehealth: Payer: Self-pay | Admitting: *Deleted

## 2012-01-20 ENCOUNTER — Other Ambulatory Visit: Payer: Self-pay | Admitting: Cardiology

## 2012-01-20 NOTE — Telephone Encounter (Signed)
Message copied by Burnell Blanks on Mon Jan 20, 2012  9:55 AM ------      Message from: Cassell Clement      Created: Fri Jan 17, 2012  1:01 PM       Hgb 10.2. Down slightly over past 2 weeks.  Continue same meds.  Continue iron pills.  Recheck CBC in another 2 weeks.

## 2012-01-20 NOTE — Telephone Encounter (Signed)
Advised patient of lab results.  Patient stated she did have something bright red in stools on Saturday (had eaten spaghetti Friday),  nothing since then.  Has had some shortness of breath. Discussed with  Dr. Patty Sermons and will have patient take extra 40 mg of Lasix today.  Call back in no improvement.  Does have a follow up with Dr Ewing Schlein Wednesday

## 2012-01-27 ENCOUNTER — Ambulatory Visit (INDEPENDENT_AMBULATORY_CARE_PROVIDER_SITE_OTHER): Payer: Medicare Other | Admitting: *Deleted

## 2012-01-27 DIAGNOSIS — I359 Nonrheumatic aortic valve disorder, unspecified: Secondary | ICD-10-CM

## 2012-01-27 DIAGNOSIS — I4891 Unspecified atrial fibrillation: Secondary | ICD-10-CM

## 2012-01-27 DIAGNOSIS — Z7901 Long term (current) use of anticoagulants: Secondary | ICD-10-CM

## 2012-01-27 DIAGNOSIS — Z954 Presence of other heart-valve replacement: Secondary | ICD-10-CM

## 2012-01-27 LAB — POCT INR: INR: 2.3

## 2012-01-31 ENCOUNTER — Other Ambulatory Visit: Payer: Self-pay | Admitting: Cardiology

## 2012-02-10 ENCOUNTER — Ambulatory Visit (INDEPENDENT_AMBULATORY_CARE_PROVIDER_SITE_OTHER): Payer: Medicare Other | Admitting: *Deleted

## 2012-02-10 ENCOUNTER — Ambulatory Visit (INDEPENDENT_AMBULATORY_CARE_PROVIDER_SITE_OTHER): Payer: Medicare Other | Admitting: Pharmacist

## 2012-02-10 DIAGNOSIS — Z789 Other specified health status: Secondary | ICD-10-CM

## 2012-02-10 DIAGNOSIS — Z954 Presence of other heart-valve replacement: Secondary | ICD-10-CM

## 2012-02-10 DIAGNOSIS — I359 Nonrheumatic aortic valve disorder, unspecified: Secondary | ICD-10-CM

## 2012-02-10 DIAGNOSIS — IMO0001 Reserved for inherently not codable concepts without codable children: Secondary | ICD-10-CM

## 2012-02-10 DIAGNOSIS — I4891 Unspecified atrial fibrillation: Secondary | ICD-10-CM

## 2012-02-10 DIAGNOSIS — D649 Anemia, unspecified: Secondary | ICD-10-CM

## 2012-02-10 DIAGNOSIS — Z7901 Long term (current) use of anticoagulants: Secondary | ICD-10-CM

## 2012-02-10 LAB — CBC WITH DIFFERENTIAL/PLATELET
Basophils Absolute: 0 10*3/uL (ref 0.0–0.1)
Eosinophils Absolute: 0 10*3/uL (ref 0.0–0.7)
HCT: 36.4 % (ref 36.0–46.0)
Hemoglobin: 11.6 g/dL — ABNORMAL LOW (ref 12.0–15.0)
Lymphocytes Relative: 16 % (ref 12.0–46.0)
Lymphs Abs: 0.7 10*3/uL (ref 0.7–4.0)
MCHC: 31.9 g/dL (ref 30.0–36.0)
Neutro Abs: 3.4 10*3/uL (ref 1.4–7.7)
RDW: 27.6 % — ABNORMAL HIGH (ref 11.5–14.6)

## 2012-02-10 LAB — BASIC METABOLIC PANEL
Calcium: 9.4 mg/dL (ref 8.4–10.5)
Creatinine, Ser: 0.8 mg/dL (ref 0.4–1.2)
GFR: 68.82 mL/min (ref >60.00)

## 2012-02-10 NOTE — Progress Notes (Signed)
Quick Note:  Please report to patient. The recent labs are stable. Continue same medication and careful diet. Hgb up further. ______

## 2012-02-11 ENCOUNTER — Telehealth: Payer: Self-pay | Admitting: *Deleted

## 2012-02-11 ENCOUNTER — Other Ambulatory Visit: Payer: Self-pay | Admitting: Dermatology

## 2012-02-11 NOTE — Telephone Encounter (Signed)
Advised patient of lab results  

## 2012-02-11 NOTE — Telephone Encounter (Signed)
Message copied by Burnell Blanks on Tue Feb 11, 2012  3:56 PM ------      Message from: Cassell Clement      Created: Mon Feb 10, 2012  9:32 PM       Please report to patient.  The recent labs are stable. Continue same medication and careful diet. Hgb up further.

## 2012-03-03 ENCOUNTER — Other Ambulatory Visit: Payer: Self-pay | Admitting: *Deleted

## 2012-03-03 DIAGNOSIS — G4700 Insomnia, unspecified: Secondary | ICD-10-CM

## 2012-03-03 MED ORDER — ZOLPIDEM TARTRATE ER 12.5 MG PO TBCR: 12.5000 mg | EXTENDED_RELEASE_TABLET | Freq: Every evening | ORAL | Status: DC | PRN

## 2012-03-03 NOTE — Telephone Encounter (Signed)
Pt needs Ambien filled at [facility]

## 2012-03-03 NOTE — Telephone Encounter (Signed)
Called to pharmacy 

## 2012-03-09 ENCOUNTER — Encounter: Payer: Self-pay | Admitting: Cardiology

## 2012-03-10 ENCOUNTER — Encounter: Payer: Self-pay | Admitting: Cardiology

## 2012-03-10 ENCOUNTER — Other Ambulatory Visit (INDEPENDENT_AMBULATORY_CARE_PROVIDER_SITE_OTHER): Payer: Medicare Other

## 2012-03-10 ENCOUNTER — Ambulatory Visit (INDEPENDENT_AMBULATORY_CARE_PROVIDER_SITE_OTHER): Payer: Medicare Other | Admitting: *Deleted

## 2012-03-10 ENCOUNTER — Ambulatory Visit (INDEPENDENT_AMBULATORY_CARE_PROVIDER_SITE_OTHER): Payer: Medicare Other | Admitting: Cardiology

## 2012-03-10 VITALS — BP 120/78 | HR 77 | Ht 60.0 in | Wt 131.0 lb

## 2012-03-10 DIAGNOSIS — I359 Nonrheumatic aortic valve disorder, unspecified: Secondary | ICD-10-CM

## 2012-03-10 DIAGNOSIS — Z952 Presence of prosthetic heart valve: Secondary | ICD-10-CM

## 2012-03-10 DIAGNOSIS — Z954 Presence of other heart-valve replacement: Secondary | ICD-10-CM

## 2012-03-10 DIAGNOSIS — I4891 Unspecified atrial fibrillation: Secondary | ICD-10-CM

## 2012-03-10 DIAGNOSIS — I119 Hypertensive heart disease without heart failure: Secondary | ICD-10-CM

## 2012-03-10 DIAGNOSIS — E039 Hypothyroidism, unspecified: Secondary | ICD-10-CM

## 2012-03-10 DIAGNOSIS — Z7901 Long term (current) use of anticoagulants: Secondary | ICD-10-CM

## 2012-03-10 DIAGNOSIS — R0989 Other specified symptoms and signs involving the circulatory and respiratory systems: Secondary | ICD-10-CM

## 2012-03-10 DIAGNOSIS — I10 Essential (primary) hypertension: Secondary | ICD-10-CM

## 2012-03-10 LAB — POCT INR: INR: 3.2

## 2012-03-10 NOTE — Assessment & Plan Note (Signed)
The patient is clinically euthyroid.  We will be checking full labs including thyroid functions in 3 months.

## 2012-03-10 NOTE — Progress Notes (Signed)
Deborah [name] Date of Birth:  05/16/1926 [facility] [address] [place], [place]  [address] [phone]         Fax   [phone]  History of Present Illness: This pleasant 78 year old woman is seen back for a scheduled followup office visit. . She has a history of previous mechanical aortic valve replacement in 1996 and she has a history of chronic atrial fibrillation and is on Coumadin. She does not have any ischemic heart disease and she had a normal nuclear stress test in March 2012. She has not been experiencing any new cardiac symptoms. She does have a past history of high blood pressure.  The patient has had significant GI problems and is followed closely by Dr. Ewing Schlein.  She is on iron and her hemoglobin has been improving slowly and  she has not been aware of any recent hematochezia.   Current Outpatient Prescriptions  Medication Sig Dispense Refill  . acetaminophen (TYLENOL) 325 MG tablet Take 2 tablets (650 mg total) by mouth every 4 (four) hours as needed.      Marland Kitchen allopurinol (ZYLOPRIM) 300 MG tablet TAKE ONE TABLET EVERY DAY  30 tablet  11  . calcium-vitamin D 500 MG tablet Take 1 tablet by mouth 2 (two) times daily.        . CELEBREX 200 MG capsule TAKE ONE CAPSULE DAILY AS NEEDED  30 each  6  . COUMADIN 5 MG tablet TAKE AS DIRECTED  30 tablet  11  . dicyclomine (BENTYL) 10 MG capsule Take 10 mg by mouth as needed. For stomach cramps      . fexofenadine (ALLEGRA ALLERGY) 180 MG tablet Take 180 mg by mouth daily. For allergies. As needed      . furosemide (LASIX) 40 MG tablet TAKE ONE TABLET EVERY DAY  30 tablet  9  . iron polysaccharides (POLY-IRON 150) 150 MG capsule Take 1 capsule (150 mg total) by mouth 2 (two) times daily.  60 capsule  5  . Iron-DSS-B12-FA-C-E-Cu-Biotin (HEMAX) 150-1 MG TABS Take by mouth.      Marland Kitchen LANOXIN 0.25 MG tablet TAKE ONE-HALF TABLET DAILY  30 tablet  11  . lansoprazole (PREVACID) 30 MG capsule Take 30 mg by mouth daily.         . metoprolol tartrate (LOPRESSOR) 12.5 mg TABS Take 12.5 mg by mouth 3 (three) times daily.       . multivitamin (THERAGRAN) per tablet Take 1 tablet by mouth daily.        Marland Kitchen NITROSTAT 0.4 MG SL tablet ONE TABLET UNDER TONGUE AS NEEDED  25 tablet  12  . Omega-3 Fatty Acids (FISH OIL) 1000 MG CAPS Take by mouth 2 (two) times daily.        . potassium chloride SA (K-DUR,KLOR-CON) 20 MEQ tablet daily.       . simvastatin (ZOCOR) 20 MG tablet TAKE ONE-HALF TABLET DAILY OR AS        DIRECTED  30 tablet  10  . SYNTHROID 88 MCG tablet TAKE ONE TABLET EVERY DAY  30 tablet  11  . TRIBENZOR 40-5-12.5 MG TABS TAKE ONE TABLET EVERY DAY  30 tablet  6  . zolpidem (AMBIEN CR) 12.5 MG CR tablet Take 1 tablet (12.5 mg total) by mouth at bedtime as needed for sleep.  30 tablet  5    Allergies  Allergen Reactions  . Amiodarone Shortness Of Breath  . Alendronate Sodium     GI upset  .  Ciprofloxacin     Rectal bleeding  . Macrodantin (Nitrofurantoin)     GI upset  . Meclizine Swelling    tongue  . Norvasc (Amlodipine Besylate)     edema  . Tussionex Pennkinetic Er (Hydrocod Polst-Cpm Polst Er)     Patient Active Problem List  Diagnosis  . Hypothyroidism  . Hypertension  . S/P aortic valve replacement with metallic valve  . Hypokalemia  . Hyponatremia  . Bronchitis  . Nonsustained ventricular tachycardia  . Encounter for long-term (current) use of anticoagulants  . Atrial fibrillation  . Dyslipidemia    History  Smoking status  . Former Smoker  . Quit date: 04/01/1988  Smokeless tobacco  . Not on file    History  Alcohol Use     Family History  Problem Relation Age of Onset  . Dementia Mother   . Coronary artery disease Father     Review of Systems: Constitutional: no fever chills diaphoresis or fatigue or change in weight.  Head and neck: no hearing loss, no epistaxis, no photophobia or visual disturbance. Respiratory: No cough, shortness of breath or  wheezing. Cardiovascular: No chest pain peripheral edema, palpitations. Gastrointestinal: No abdominal distention, no abdominal pain, no change in bowel habits hematochezia or melena. Genitourinary: No dysuria, no frequency, no urgency, no nocturia. Musculoskeletal:No arthralgias, no back pain, no gait disturbance or myalgias. Neurological: No dizziness, no headaches, no numbness, no seizures, no syncope, no weakness, no tremors. Hematologic: No lymphadenopathy, no easy bruising. Psychiatric: No confusion, no hallucinations, no sleep disturbance.    Physical Exam: Filed Vitals:   03/10/12 1043  BP: 120/78  Pulse: 77   the general appearance reveals a well-developed well-nourished woman in no distress.  Color is good.The head and neck exam reveals pupils equal and reactive.  Extraocular movements are full.  There is no scleral icterus.  The mouth and pharynx are normal.  The neck is supple.  The carotids reveal no bruits.  The jugular venous pressure is normal.  The  thyroid is not enlarged.  There is no lymphadenopathy.  The chest is clear to percussion and auscultation.  There are no rales or rhonchi.  Expansion of the chest is symmetrical.  The precordium is quiet.  The first heart sound is normal.  The second heart sound is physiologically split.  There is a grade 2/6 harsh systolic ejection murmur at the left sternal edge and base with radiation to the neck.  The aortic closure sound is crisp and there is no aortic insufficiency There is no abnormal lift or heave.  The abdomen is soft and nontender.  The bowel sounds are normal.  The liver and spleen are not enlarged.  There are no abdominal masses.  There are no abdominal bruits.  Extremities reveal good pedal pulses.  There is no phlebitis or edema.  There is no cyanosis or clubbing.  Strength is normal and symmetrical in all extremities.  There is no lateralizing weakness.  There are no sensory deficits.  The skin is warm and dry.  There is no  rash.     Assessment / Plan: Continue same medication.  We did not draw CBC today because she had one yesterday at her gastroenterology office.  We are checking an INR today. When she returns in 3 months we will plan to get lipid panel hepatic function panel basal metabolic panel CBC TSH and free T4.

## 2012-03-10 NOTE — Assessment & Plan Note (Signed)
The patient has not been experiencing any chest pain or angina.  She has not been getting any regular exercise.  She intends to start back into group exercise classes in the near future.

## 2012-03-10 NOTE — Patient Instructions (Addendum)
No labs today EXCEPT INR  Your physician recommends that you schedule a follow-up appointment in: 3 months with fasting labs (LP/BMET/HFP/CBC/T4F/TSH)

## 2012-03-10 NOTE — Assessment & Plan Note (Signed)
The patient has a history of high blood pressure.  Her blood pressure has recently been stable on current therapy.  She has been taking her potassium tablets only once a day rather than twice a day and we are going to continue her on just once a day.

## 2012-03-10 NOTE — Assessment & Plan Note (Signed)
The patient is on long-term Coumadin for her atrial fibrillation and her mechanical prosthetic aortic valve.  She has not been having a TIA symptoms.

## 2012-03-16 ENCOUNTER — Emergency Department (HOSPITAL_COMMUNITY): Payer: Medicare Other

## 2012-03-16 ENCOUNTER — Encounter (HOSPITAL_COMMUNITY): Payer: Self-pay | Admitting: *Deleted

## 2012-03-16 ENCOUNTER — Emergency Department (HOSPITAL_COMMUNITY)
Admission: EM | Admit: 2012-03-16 | Discharge: 2012-03-16 | Disposition: A | Discharge status: Home / Self Care | Payer: Medicare Other | Attending: Emergency Medicine | Admitting: Emergency Medicine

## 2012-03-16 ENCOUNTER — Telehealth: Payer: Self-pay | Admitting: Cardiology

## 2012-03-16 ENCOUNTER — Other Ambulatory Visit: Payer: Self-pay | Admitting: Cardiology

## 2012-03-16 DIAGNOSIS — E039 Hypothyroidism, unspecified: Secondary | ICD-10-CM | POA: Insufficient documentation

## 2012-03-16 DIAGNOSIS — S0003XA Contusion of scalp, initial encounter: Secondary | ICD-10-CM | POA: Insufficient documentation

## 2012-03-16 DIAGNOSIS — Z79899 Other long term (current) drug therapy: Secondary | ICD-10-CM | POA: Insufficient documentation

## 2012-03-16 DIAGNOSIS — Z853 Personal history of malignant neoplasm of breast: Secondary | ICD-10-CM | POA: Insufficient documentation

## 2012-03-16 DIAGNOSIS — I10 Essential (primary) hypertension: Secondary | ICD-10-CM | POA: Insufficient documentation

## 2012-03-16 DIAGNOSIS — S0083XA Contusion of other part of head, initial encounter: Secondary | ICD-10-CM | POA: Insufficient documentation

## 2012-03-16 DIAGNOSIS — Z7901 Long term (current) use of anticoagulants: Secondary | ICD-10-CM | POA: Insufficient documentation

## 2012-03-16 DIAGNOSIS — Z87891 Personal history of nicotine dependence: Secondary | ICD-10-CM | POA: Insufficient documentation

## 2012-03-16 DIAGNOSIS — Y939 Activity, unspecified: Secondary | ICD-10-CM | POA: Insufficient documentation

## 2012-03-16 DIAGNOSIS — W208XXA Other cause of strike by thrown, projected or falling object, initial encounter: Secondary | ICD-10-CM | POA: Insufficient documentation

## 2012-03-16 DIAGNOSIS — H539 Unspecified visual disturbance: Secondary | ICD-10-CM | POA: Insufficient documentation

## 2012-03-16 DIAGNOSIS — R51 Headache: Secondary | ICD-10-CM | POA: Insufficient documentation

## 2012-03-16 DIAGNOSIS — S0990XA Unspecified injury of head, initial encounter: Secondary | ICD-10-CM | POA: Insufficient documentation

## 2012-03-16 DIAGNOSIS — R110 Nausea: Secondary | ICD-10-CM | POA: Insufficient documentation

## 2012-03-16 DIAGNOSIS — Z8719 Personal history of other diseases of the digestive system: Secondary | ICD-10-CM | POA: Insufficient documentation

## 2012-03-16 DIAGNOSIS — Z8679 Personal history of other diseases of the circulatory system: Secondary | ICD-10-CM | POA: Insufficient documentation

## 2012-03-16 DIAGNOSIS — Y9289 Other specified places as the place of occurrence of the external cause: Secondary | ICD-10-CM | POA: Insufficient documentation

## 2012-03-16 LAB — PROTIME-INR: Prothrombin Time: 27.5 seconds — ABNORMAL HIGH (ref 11.6–15.2)

## 2012-03-16 MED ORDER — OLMESARTAN-AMLODIPINE-HCTZ 40-5-12.5 MG PO TABS: 1.0000 | ORAL_TABLET | Freq: Every day | ORAL | Status: DC
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
## 2012-03-16 NOTE — Telephone Encounter (Signed)
Patient is feeling different and having some visual disturbances. Discussed with  Dr. Patty Sermons and patient needs to go to emergency department to be evaluated and get CT of head.  Deborah [name] and patient is actually already in route to ED

## 2012-03-16 NOTE — ED Notes (Signed)
The pt is still waiting for c-t 
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
## 2012-03-16 NOTE — ED Notes (Signed)
Pt waiting for c-t daughter remains at the bedside

## 2012-03-16 NOTE — ED Notes (Signed)
The pt just returned from  c-t 

## 2012-03-17 NOTE — ED Provider Notes (Signed)
I have personally seen and examined the patient.  I have discussed the plan of care with the resident.  I have reviewed the documentation on PMH/FH/Soc. History.  I have reviewed the documentation of the resident and agree.  Pt seen/examined, well appearing, no distress, discussed strict return precautions including worsened HA, vomitng/weakness. Family will check on her frequently over next 24 hours  Joya Gaskins, MD 03/17/12 0020

## 2012-04-07 ENCOUNTER — Ambulatory Visit (INDEPENDENT_AMBULATORY_CARE_PROVIDER_SITE_OTHER): Payer: Medicare Other

## 2012-04-07 DIAGNOSIS — Z7901 Long term (current) use of anticoagulants: Secondary | ICD-10-CM

## 2012-04-07 DIAGNOSIS — I4891 Unspecified atrial fibrillation: Secondary | ICD-10-CM

## 2012-04-07 DIAGNOSIS — Z954 Presence of other heart-valve replacement: Secondary | ICD-10-CM

## 2012-04-07 DIAGNOSIS — I359 Nonrheumatic aortic valve disorder, unspecified: Secondary | ICD-10-CM

## 2012-04-08 ENCOUNTER — Other Ambulatory Visit: Payer: Self-pay

## 2012-04-08 DIAGNOSIS — M1990 Unspecified osteoarthritis, unspecified site: Secondary | ICD-10-CM

## 2012-04-08 MED ORDER — CELEBREX 200 MG PO CAPS: 200.0000 mg | ORAL_CAPSULE | Freq: Every day | ORAL | Status: DC

## 2012-04-28 ENCOUNTER — Telehealth: Payer: Self-pay | Admitting: Cardiology

## 2012-04-28 DIAGNOSIS — D649 Anemia, unspecified: Secondary | ICD-10-CM

## 2012-04-28 NOTE — Telephone Encounter (Signed)
Patient requested cbc be done next week since her iron was decreased to every other day, patient having increased shortness of breath.  Did offer patient an appointment, declined.  Did schedule and advised to call back if shortness of breath worse, verbalized understanding.

## 2012-04-28 NOTE — Telephone Encounter (Signed)
Agree with plan 

## 2012-04-28 NOTE — Telephone Encounter (Signed)
Pt would like to talk to you regarding some labs

## 2012-05-04 ENCOUNTER — Other Ambulatory Visit (INDEPENDENT_AMBULATORY_CARE_PROVIDER_SITE_OTHER): Payer: Medicare Other

## 2012-05-04 ENCOUNTER — Ambulatory Visit (INDEPENDENT_AMBULATORY_CARE_PROVIDER_SITE_OTHER): Payer: Medicare Other

## 2012-05-04 DIAGNOSIS — I359 Nonrheumatic aortic valve disorder, unspecified: Secondary | ICD-10-CM

## 2012-05-04 DIAGNOSIS — Z7901 Long term (current) use of anticoagulants: Secondary | ICD-10-CM

## 2012-05-04 DIAGNOSIS — I4891 Unspecified atrial fibrillation: Secondary | ICD-10-CM

## 2012-05-04 DIAGNOSIS — D649 Anemia, unspecified: Secondary | ICD-10-CM

## 2012-05-04 DIAGNOSIS — Z954 Presence of other heart-valve replacement: Secondary | ICD-10-CM

## 2012-05-04 LAB — CBC WITH DIFFERENTIAL/PLATELET
Basophils Relative: 0.3 % (ref 0.0–3.0)
Eosinophils Relative: 0.9 % (ref 0.0–5.0)
HCT: 35.2 % — ABNORMAL LOW (ref 36.0–46.0)
Lymphs Abs: 0.8 10*3/uL (ref 0.7–4.0)
MCV: 98.4 fl (ref 78.0–100.0)
Monocytes Absolute: 0.3 10*3/uL (ref 0.1–1.0)
Monocytes Relative: 8.2 % (ref 3.0–12.0)
Neutrophils Relative %: 70.3 % (ref 43.0–77.0)
RBC: 3.57 Mil/uL — ABNORMAL LOW (ref 3.87–5.11)
WBC: 4 10*3/uL — ABNORMAL LOW (ref 4.5–10.5)

## 2012-05-04 NOTE — Progress Notes (Signed)
Quick Note:  Please report to patient. The recent labs are stable. Continue same medication and careful diet. Hemoglobin is unchanged at 11.6 which is the same as November and did better than October. ______

## 2012-05-08 ENCOUNTER — Telehealth: Payer: Self-pay | Admitting: *Deleted

## 2012-05-08 NOTE — Telephone Encounter (Signed)
Advised patient of lab results  

## 2012-05-08 NOTE — Telephone Encounter (Signed)
Message copied by Burnell Blanks on Fri May 08, 2012  3:59 PM ------      Message from: Cassell Clement      Created: Mon May 04, 2012  1:43 PM       Please report to patient.  The recent labs are stable. Continue same medication and careful diet.  Hemoglobin is unchanged at 11.6 which is the same as November and did better than October.

## 2012-05-20 ENCOUNTER — Telehealth: Payer: Self-pay | Admitting: Cardiology

## 2012-05-20 DIAGNOSIS — R309 Painful micturition, unspecified: Secondary | ICD-10-CM

## 2012-05-20 NOTE — Telephone Encounter (Signed)
Spoke with patient and she is having pain/pressure when urinates.  Ordered U/A for am per  Dr. Patty Sermons

## 2012-05-20 NOTE — Telephone Encounter (Signed)
New Problem   Pt feels like she has a UTI and would like to come in for a specimen in the AM. Would like to speak to nurse.

## 2012-05-21 ENCOUNTER — Ambulatory Visit (INDEPENDENT_AMBULATORY_CARE_PROVIDER_SITE_OTHER): Payer: Medicare Other | Admitting: *Deleted

## 2012-05-21 ENCOUNTER — Ambulatory Visit (INDEPENDENT_AMBULATORY_CARE_PROVIDER_SITE_OTHER): Payer: Medicare Other | Admitting: Cardiology

## 2012-05-21 ENCOUNTER — Encounter: Payer: Self-pay | Admitting: Cardiology

## 2012-05-21 VITALS — BP 158/82 | HR 64 | Ht 60.0 in | Wt 133.6 lb

## 2012-05-21 DIAGNOSIS — R0989 Other specified symptoms and signs involving the circulatory and respiratory systems: Secondary | ICD-10-CM

## 2012-05-21 DIAGNOSIS — I4891 Unspecified atrial fibrillation: Secondary | ICD-10-CM

## 2012-05-21 DIAGNOSIS — R5381 Other malaise: Secondary | ICD-10-CM

## 2012-05-21 DIAGNOSIS — R309 Painful micturition, unspecified: Secondary | ICD-10-CM

## 2012-05-21 DIAGNOSIS — R5383 Other fatigue: Secondary | ICD-10-CM

## 2012-05-21 DIAGNOSIS — I10 Essential (primary) hypertension: Secondary | ICD-10-CM

## 2012-05-21 LAB — CBC WITH DIFFERENTIAL/PLATELET
Basophils Absolute: 0 10*3/uL (ref 0.0–0.1)
Eosinophils Absolute: 0 10*3/uL (ref 0.0–0.7)
HCT: 37.7 % (ref 36.0–46.0)
Lymphs Abs: 0.6 10*3/uL — ABNORMAL LOW (ref 0.7–4.0)
MCHC: 32.3 g/dL (ref 30.0–36.0)
MCV: 98.2 fl (ref 78.0–100.0)
Monocytes Absolute: 0.3 10*3/uL (ref 0.1–1.0)
Neutrophils Relative %: 82.1 % — ABNORMAL HIGH (ref 43.0–77.0)
Platelets: 167 10*3/uL (ref 150.0–400.0)
RDW: 13.9 % (ref 11.5–14.6)
WBC: 5.7 10*3/uL (ref 4.5–10.5)

## 2012-05-21 LAB — TSH: TSH: 1.13 u[IU]/mL (ref 0.35–5.50)

## 2012-05-21 NOTE — Progress Notes (Signed)
Deborah [name] Date of Birth:  [birth year] [facility] [address] [place], [place]  [address] [phone]         Fax   [phone]  History of Present Illness: This pleasant 79 year old woman is seen back for a work in office visit.Marland Kitchen she has not been feeling well.  She feels more tired and run down.  She has had no fever but has occasional chills she has also been experiencing urinary frequency and she's had a lot of abdominal bloating. She has a history of previous mechanical aortic valve replacement in 1996 and she has a history of chronic atrial fibrillation and is on Coumadin. She does not have any ischemic heart disease and she had a normal nuclear stress test in March 2012. She has not been experiencing any new cardiac symptoms. She does have a past history of high blood pressure. The patient has had significant GI problems and is followed closely by Dr. Ewing Schlein. She is on iron and her hemoglobin has been improving slowly and she has not been aware of any recent hematochezia.   Current Outpatient Prescriptions  Medication Sig Dispense Refill  . acetaminophen (TYLENOL) 325 MG tablet Take 2 tablets (650 mg total) by mouth every 4 (four) hours as needed.      Marland Kitchen allopurinol (ZYLOPRIM) 300 MG tablet TAKE ONE TABLET EVERY DAY  30 tablet  11  . calcium-vitamin D 500 MG tablet Take 1 tablet by mouth 2 (two) times daily.        . CELEBREX 200 MG capsule Take 1 capsule (200 mg total) by mouth daily.  30 each  5  . COUMADIN 5 MG tablet TAKE AS DIRECTED  30 tablet  11  . dicyclomine (BENTYL) 10 MG capsule Take 10 mg by mouth as needed. For stomach cramps      . fexofenadine (ALLEGRA ALLERGY) 180 MG tablet Take 180 mg by mouth daily. For allergies. As needed      . furosemide (LASIX) 40 MG tablet TAKE ONE TABLET EVERY DAY  30 tablet  9  . iron polysaccharides (NIFEREX) 150 MG capsule Take 150 mg by mouth as directed. Takes every other day      . LANOXIN 0.25 MG tablet TAKE  ONE-HALF TABLET DAILY  30 tablet  11  . lansoprazole (PREVACID) 30 MG capsule Take 30 mg by mouth daily.        . metoprolol tartrate (LOPRESSOR) 12.5 mg TABS Take 12.5 mg by mouth 3 (three) times daily.       . multivitamin (THERAGRAN) per tablet Take 1 tablet by mouth daily.       Marland Kitchen NITROSTAT 0.4 MG SL tablet ONE TABLET UNDER TONGUE AS NEEDED  25 tablet  12  . Olmesartan-Amlodipine-HCTZ (TRIBENZOR) 40-5-12.5 MG TABS Take 1 tablet by mouth daily.  30 tablet  6  . Omega-3 Fatty Acids (FISH OIL) 1000 MG CAPS Take by mouth 2 (two) times daily.        . potassium chloride SA (K-DUR,KLOR-CON) 20 MEQ tablet daily.       . simvastatin (ZOCOR) 20 MG tablet TAKE ONE-HALF TABLET DAILY OR AS        DIRECTED  30 tablet  10  . SYNTHROID 88 MCG tablet TAKE ONE TABLET EVERY DAY  30 tablet  11  . zolpidem (AMBIEN CR) 12.5 MG CR tablet Take 1 tablet (12.5 mg total) by mouth at bedtime as needed for sleep.  30 tablet  5  . Iron-DSS-B12-FA-C-E-Cu-Biotin (  HEMAX) 150-1 MG TABS Take by mouth.       No current facility-administered medications for this visit.    Allergies  Allergen Reactions  . Amiodarone Shortness Of Breath  . Alendronate Sodium     GI upset  . Ciprofloxacin     Rectal bleeding  . Macrodantin (Nitrofurantoin)     GI upset  . Meclizine Swelling    tongue  . Norvasc (Amlodipine Besylate)     edema  . Tussionex Pennkinetic Er (Hydrocod Polst-Cpm Polst Er)     Patient Active Problem List  Diagnosis  . Hypothyroidism  . Hypertension  . S/P aortic valve replacement with metallic valve  . Hypokalemia  . Hyponatremia  . Bronchitis  . Nonsustained ventricular tachycardia  . Encounter for long-term (current) use of anticoagulants  . Atrial fibrillation  . Dyslipidemia    History  Smoking status  . Former Smoker  . Quit date: 04/01/1988  Smokeless tobacco  . Not on file    History  Alcohol Use     Family History  Problem Relation Age of Onset  . Dementia Mother   .  Coronary artery disease Father     Review of Systems: Constitutional: no fever chills diaphoresis or fatigue or change in weight.  Head and neck: no hearing loss, no epistaxis, no photophobia or visual disturbance. Respiratory: No cough, shortness of breath or wheezing. Cardiovascular: No chest pain peripheral edema, palpitations. Gastrointestinal: No abdominal distention, no abdominal pain, no change in bowel habits hematochezia or melena. Genitourinary: No dysuria, no frequency, no urgency, no nocturia. Musculoskeletal:No arthralgias, no back pain, no gait disturbance or myalgias. Neurological: No dizziness, no headaches, no numbness, no seizures, no syncope, no weakness, no tremors. Hematologic: No lymphadenopathy, no easy bruising. Psychiatric: No confusion, no hallucinations, no sleep disturbance.    Physical Exam: Filed Vitals:   05/21/12 1055  BP: 158/82  Pulse: 64   the general appearance reveals a well-developed well-nourished woman in no distress.The head and neck exam reveals pupils equal and reactive.  Extraocular movements are full.  There is no scleral icterus.  The mouth and pharynx are normal.  The neck is supple.  The carotids reveal no bruits.  The jugular venous pressure is normal.  The  thyroid is not enlarged.  There is no lymphadenopathy.  The chest is clear to percussion and auscultation.  There are no rales or rhonchi.  Expansion of the chest is symmetrical.  The precordium is quiet.  The aortic valve prosthetic opening and closure sounds are crisp. There is no  gallop rub or click.  There is a soft systolic murmur across the prosthetic aortic valve.  There is no diastolic murmur. There is no abnormal lift or heave.  The abdomen is soft and nontender.  The bowel sounds are normal.  The liver and spleen are not enlarged.  There are no abdominal masses.  There are no abdominal bruits.  Extremities reveal good pedal pulses.  There is no phlebitis or edema.  There is no  cyanosis or clubbing.  Strength is normal and symmetrical in all extremities.  There is no lateralizing weakness.  There are no sensory deficits.  The skin is warm and dry.  There is no rash.     Assessment / Plan: Continue same medication.  Await results of today's chest x-ray and CBC and TSH and urinalysis and culture.  Recheck at regular visit or sooner when necessary

## 2012-05-21 NOTE — Patient Instructions (Signed)
Will obtain labs today and call you with the results (CBC/TSH)  GO FOR A CHEST XRAY TO [facility]  Keep your regular appointment

## 2012-05-21 NOTE — Assessment & Plan Note (Signed)
The patient has not been having a dizzy spells or syncope.  No palpitations.  Pressure has generally been stable.  No symptoms of CHF

## 2012-05-21 NOTE — Assessment & Plan Note (Signed)
Patient remains in atrial fibrillation controlled ventricular response.  She has not had any TIA symptoms.  She remains on long-term Coumadin.  Her last INR was supratherapeutic at 3.8.

## 2012-05-21 NOTE — Progress Notes (Signed)
Quick Note:  Please report to patient. The recent labs are stable. Continue same medication and careful diet. Hgb better 12.2. WBC normal. TSH normal ______

## 2012-05-21 NOTE — Assessment & Plan Note (Signed)
The patient feels as if she is more tired and has no energy.  We will check a CBC to be sure that her hemoglobin has not dropped significantly since last check.  We will also check a TSH.  She is on Synthroid.  Her last chest x-ray was about a year ago and because of her symptoms of exertional dyspnea we will update her chest x-ray.  She is having urinary frequency and may have a urinary tract infection which could also cause malaise and fatigue and we will send off a urinalysis and culture.

## 2012-05-22 ENCOUNTER — Ambulatory Visit (INDEPENDENT_AMBULATORY_CARE_PROVIDER_SITE_OTHER)
Admission: RE | Admit: 2012-05-22 | Discharge: 2012-05-22 | Disposition: A | Discharge status: Home / Self Care | Payer: Medicare Other | Source: Ambulatory Visit | Attending: Cardiology | Admitting: Cardiology

## 2012-05-22 ENCOUNTER — Telehealth: Payer: Self-pay | Admitting: *Deleted

## 2012-05-22 DIAGNOSIS — R0989 Other specified symptoms and signs involving the circulatory and respiratory systems: Secondary | ICD-10-CM

## 2012-05-22 DIAGNOSIS — R5381 Other malaise: Secondary | ICD-10-CM

## 2012-05-22 LAB — URINALYSIS W MICROSCOPIC + REFLEX CULTURE
Casts: NONE SEEN
Crystals: NONE SEEN
Glucose, UA: NEGATIVE mg/dL
Hgb urine dipstick: NEGATIVE
Leukocytes, UA: NEGATIVE
Specific Gravity, Urine: 1.01 (ref 1.005–1.030)
Squamous Epithelial / LPF: NONE SEEN
pH: 7.5 (ref 5.0–8.0)

## 2012-05-22 NOTE — Telephone Encounter (Signed)
Message copied by Burnell Blanks on Fri May 22, 2012  3:01 PM ------      Message from: Cassell Clement      Created: Fri May 22, 2012 10:23 AM       Please report.  The urinalysis did not show any evidence of infection ------

## 2012-05-22 NOTE — Telephone Encounter (Signed)
Message copied by Burnell Blanks on Fri May 22, 2012  3:01 PM ------      Message from: Cassell Clement      Created: Fri May 22, 2012 10:22 AM       Please report.  The chest x-ray was good.  There was no evidence of heart failure or pneumonia or infection.  The heart size remains enlarged but no worse than before. ------

## 2012-05-22 NOTE — Telephone Encounter (Signed)
Advised patient

## 2012-05-22 NOTE — Telephone Encounter (Signed)
Message copied by Burnell Blanks on Fri May 22, 2012  3:01 PM ------      Message from: Cassell Clement      Created: Thu May 21, 2012  8:18 PM       Please report to patient.  The recent labs are stable. Continue same medication and careful diet. Hgb better 12.2.  WBC normal. TSH normal ------

## 2012-06-03 ENCOUNTER — Encounter: Payer: Self-pay | Admitting: Cardiology

## 2012-06-03 ENCOUNTER — Other Ambulatory Visit (INDEPENDENT_AMBULATORY_CARE_PROVIDER_SITE_OTHER): Payer: Medicare Other

## 2012-06-03 ENCOUNTER — Ambulatory Visit (INDEPENDENT_AMBULATORY_CARE_PROVIDER_SITE_OTHER): Payer: Medicare Other | Admitting: *Deleted

## 2012-06-03 ENCOUNTER — Ambulatory Visit (INDEPENDENT_AMBULATORY_CARE_PROVIDER_SITE_OTHER): Payer: Medicare Other | Admitting: Cardiology

## 2012-06-03 VITALS — BP 164/88 | HR 54 | Ht 60.0 in | Wt 132.0 lb

## 2012-06-03 DIAGNOSIS — I119 Hypertensive heart disease without heart failure: Secondary | ICD-10-CM

## 2012-06-03 DIAGNOSIS — I4891 Unspecified atrial fibrillation: Secondary | ICD-10-CM

## 2012-06-03 DIAGNOSIS — Z954 Presence of other heart-valve replacement: Secondary | ICD-10-CM

## 2012-06-03 DIAGNOSIS — I10 Essential (primary) hypertension: Secondary | ICD-10-CM

## 2012-06-03 DIAGNOSIS — I359 Nonrheumatic aortic valve disorder, unspecified: Secondary | ICD-10-CM

## 2012-06-03 DIAGNOSIS — Z7901 Long term (current) use of anticoagulants: Secondary | ICD-10-CM

## 2012-06-03 DIAGNOSIS — E785 Hyperlipidemia, unspecified: Secondary | ICD-10-CM

## 2012-06-03 LAB — LIPID PANEL
Cholesterol: 177 mg/dL (ref 0–200)
LDL Cholesterol: 94 mg/dL (ref 0–99)
Triglycerides: 105 mg/dL (ref 0.0–149.0)

## 2012-06-03 LAB — BASIC METABOLIC PANEL
BUN: 19 mg/dL (ref 6–23)
CO2: 29 mEq/L (ref 19–32)
Chloride: 100 mEq/L (ref 96–112)
Creatinine, Ser: 0.6 mg/dL (ref 0.4–1.2)

## 2012-06-03 LAB — CBC WITH DIFFERENTIAL/PLATELET
Basophils Relative: 0.3 % (ref 0.0–3.0)
Eosinophils Relative: 0.8 % (ref 0.0–5.0)
Lymphocytes Relative: 15.1 % (ref 12.0–46.0)
Neutrophils Relative %: 77 % (ref 43.0–77.0)
RBC: 3.88 Mil/uL (ref 3.87–5.11)
WBC: 4.7 10*3/uL (ref 4.5–10.5)

## 2012-06-03 LAB — HEPATIC FUNCTION PANEL
ALT: 39 U/L — ABNORMAL HIGH (ref 0–35)
Total Protein: 6.7 g/dL (ref 6.0–8.3)

## 2012-06-03 LAB — POCT INR: INR: 3.1

## 2012-06-03 NOTE — Patient Instructions (Signed)
Your physician recommends that you schedule a follow-up appointment in: 3 months   Your physician recommends that you return for a FASTING lipid profile: today and in 3 months

## 2012-06-03 NOTE — Assessment & Plan Note (Signed)
Her blood pressure is a little higher today here at the office but she did not take any blood pressure medicines this morning because of fasting.  I rechecked her blood pressure and it had come down to 148/80 in the left.  She has not been having any headaches or dizzy spells

## 2012-06-03 NOTE — Assessment & Plan Note (Signed)
She is on long-term Coumadin because of her atrial fibrillation and also her mechanical aortic valve.  Her prothrombin times have been in the therapeutic range.  He has not had any TIA or stroke symptoms.  She is not having any symptoms of CHF

## 2012-06-03 NOTE — Assessment & Plan Note (Signed)
Patient has established atrial fibrillation which she tolerates well.  She is not aware of any racing or pounding of her heart.

## 2012-06-03 NOTE — Progress Notes (Signed)
Deborah [name] Date of Birth:  05-20-1926 [facility] [address] [place], [place]  [address] [phone]         Fax   [phone]  History of Present Illness: This pleasant 79 year old woman is seen back for a scheduled followup office visit. . She has a history of previous mechanical aortic valve replacement in 1996 and she has a history of chronic atrial fibrillation and is on Coumadin. She does not have any ischemic heart disease and she had a normal nuclear stress test in March 2012. She has not been experiencing any new cardiac symptoms. She does have a past history of high blood pressure. The patient has had significant GI problems and is followed closely by Dr. Ewing Schlein. She is on iron and her hemoglobin has been improving slowly and she has not been aware of any recent hematochezia.  Since last visit her energy level has improved slightly and she has had no new cardiac symptoms   Current Outpatient Prescriptions  Medication Sig Dispense Refill  . acetaminophen (TYLENOL) 325 MG tablet Take 2 tablets (650 mg total) by mouth every 4 (four) hours as needed.      Marland Kitchen allopurinol (ZYLOPRIM) 300 MG tablet TAKE ONE TABLET EVERY DAY  30 tablet  11  . calcium-vitamin D 500 MG tablet Take 1 tablet by mouth 2 (two) times daily.        . CELEBREX 200 MG capsule Take 1 capsule (200 mg total) by mouth daily.  30 each  5  . COUMADIN 5 MG tablet TAKE AS DIRECTED  30 tablet  11  . dicyclomine (BENTYL) 10 MG capsule Take 10 mg by mouth as needed. For stomach cramps      . fexofenadine (ALLEGRA ALLERGY) 180 MG tablet Take 180 mg by mouth daily. For allergies. As needed      . furosemide (LASIX) 40 MG tablet TAKE ONE TABLET EVERY DAY  30 tablet  9  . iron polysaccharides (NIFEREX) 150 MG capsule Take 150 mg by mouth as directed. Takes every other day      . LANOXIN 0.25 MG tablet TAKE ONE-HALF TABLET DAILY  30 tablet  11  . lansoprazole (PREVACID) 30 MG capsule Take 30 mg by mouth  daily.        . metoprolol tartrate (LOPRESSOR) 12.5 mg TABS Take 12.5 mg by mouth 3 (three) times daily.       . multivitamin (THERAGRAN) per tablet Take 1 tablet by mouth daily.       Marland Kitchen NITROSTAT 0.4 MG SL tablet ONE TABLET UNDER TONGUE AS NEEDED  25 tablet  12  . Olmesartan-Amlodipine-HCTZ (TRIBENZOR) 40-5-12.5 MG TABS Take 1 tablet by mouth daily.  30 tablet  6  . Omega-3 Fatty Acids (FISH OIL) 1000 MG CAPS Take by mouth 2 (two) times daily.        . potassium chloride SA (K-DUR,KLOR-CON) 20 MEQ tablet daily.       . simvastatin (ZOCOR) 20 MG tablet TAKE ONE-HALF TABLET DAILY OR AS        DIRECTED  30 tablet  10  . SYNTHROID 88 MCG tablet TAKE ONE TABLET EVERY DAY  30 tablet  11  . zolpidem (AMBIEN CR) 12.5 MG CR tablet Take 1 tablet (12.5 mg total) by mouth at bedtime as needed for sleep.  30 tablet  5   No current facility-administered medications for this visit.    Allergies  Allergen Reactions  . Amiodarone Shortness Of Breath  .  Alendronate Sodium     GI upset  . Ciprofloxacin     Rectal bleeding  . Macrodantin (Nitrofurantoin)     GI upset  . Meclizine Swelling    tongue  . Norvasc (Amlodipine Besylate)     edema  . Tussionex Pennkinetic Er (Hydrocod Polst-Cpm Polst Er)     Patient Active Problem List  Diagnosis  . Hypothyroidism  . Hypertension  . S/P aortic valve replacement with metallic valve  . Hypokalemia  . Hyponatremia  . Bronchitis  . Nonsustained ventricular tachycardia  . Encounter for long-term (current) use of anticoagulants  . Atrial fibrillation  . Dyslipidemia  . Malaise and fatigue    History  Smoking status  . Former Smoker  . Quit date: 04/01/1988  Smokeless tobacco  . Not on file    History  Alcohol Use     Family History  Problem Relation Age of Onset  . Dementia Mother   . Coronary artery disease Father     Review of Systems: Constitutional: no fever chills diaphoresis or fatigue or change in weight.  Head and neck: no  hearing loss, no epistaxis, no photophobia or visual disturbance. Respiratory: No cough, shortness of breath or wheezing. Cardiovascular: No chest pain peripheral edema, palpitations. Gastrointestinal: No abdominal distention, no abdominal pain, no change in bowel habits hematochezia or melena. Genitourinary: No dysuria, no frequency, no urgency, no nocturia. Musculoskeletal:No arthralgias, no back pain, no gait disturbance or myalgias. Neurological: No dizziness, no headaches, no numbness, no seizures, no syncope, no weakness, no tremors. Hematologic: No lymphadenopathy, no easy bruising. Psychiatric: No confusion, no hallucinations, no sleep disturbance.    Physical Exam: Filed Vitals:   06/03/12 0929  BP: 164/88  Pulse: 54   repeat blood pressure is 148/80.  The general appearance reveals a well-developed well-nourished woman in no distress.The head and neck exam reveals pupils equal and reactive.  Extraocular movements are full.  There is no scleral icterus.  The mouth and pharynx are normal.  The neck is supple.  The carotids reveal no bruits.  The jugular venous pressure is normal.  The  thyroid is not enlarged.  There is no lymphadenopathy.  The chest is clear to percussion and auscultation.  There are no rales or rhonchi.  Expansion of the chest is symmetrical.  The precordium is quiet.  The first heart sound is normal.  The second heart sound is physiologically split.  There are good opening and closing aortic valve clicks and no aortic insufficiency.  There is no abnormal lift or heave.  The abdomen is soft and nontender.  The bowel sounds are normal.  The liver and spleen are not enlarged.  There are no abdominal masses.  There are no abdominal bruits.  Extremities reveal good pedal pulses.  There is no phlebitis or edema.  There is no cyanosis or clubbing.  Strength is normal and symmetrical in all extremities.  There is no lateralizing weakness.  There are no sensory deficits.  The skin  is warm and dry.  There is no rash.     Assessment / Plan: The patient appears to be doing well on her current therapy.  Fasting blood work today is pending Recheck in 3 months for followup office visit and fasting lipid panel hepatic function panel basal metabolic panel and TSH.

## 2012-06-03 NOTE — Progress Notes (Signed)
Quick Note:  Please report to patient. The recent labs are stable. Continue same medication and careful diet. Thyroid good. Lipids good. Hgb better 12.4 ______

## 2012-06-09 ENCOUNTER — Telehealth: Payer: Self-pay | Admitting: Cardiology

## 2012-06-09 NOTE — Telephone Encounter (Signed)
Message copied by Burnell Blanks on Tue Jun 09, 2012  9:39 AM ------      Message from: Cassell Clement      Created: Wed Jun 03, 2012  6:02 PM       Please report to patient.  The recent labs are stable. Continue same medication and careful diet. Thyroid good.  Lipids good. Hgb better 12.4 ------

## 2012-06-09 NOTE — Telephone Encounter (Signed)
Advised patient of lab results  

## 2012-06-09 NOTE — Telephone Encounter (Signed)
New Problem:    Patient called in returning your call regarding her lab work.  Please call back.

## 2012-06-29 ENCOUNTER — Telehealth: Payer: Self-pay | Admitting: Cardiology

## 2012-06-29 DIAGNOSIS — D649 Anemia, unspecified: Secondary | ICD-10-CM

## 2012-06-29 NOTE — Telephone Encounter (Signed)
New Prob    Requesting a hemaglobin test done when she comes in on Wednesday. Would like to speak to nurse.

## 2012-06-29 NOTE — Telephone Encounter (Signed)
Ordered CBC for Wednesday as requested

## 2012-07-01 ENCOUNTER — Other Ambulatory Visit (INDEPENDENT_AMBULATORY_CARE_PROVIDER_SITE_OTHER): Payer: Medicare Other

## 2012-07-01 ENCOUNTER — Ambulatory Visit (INDEPENDENT_AMBULATORY_CARE_PROVIDER_SITE_OTHER): Payer: Medicare Other | Admitting: *Deleted

## 2012-07-01 DIAGNOSIS — I359 Nonrheumatic aortic valve disorder, unspecified: Secondary | ICD-10-CM

## 2012-07-01 DIAGNOSIS — Z954 Presence of other heart-valve replacement: Secondary | ICD-10-CM

## 2012-07-01 DIAGNOSIS — Z7901 Long term (current) use of anticoagulants: Secondary | ICD-10-CM

## 2012-07-01 DIAGNOSIS — D649 Anemia, unspecified: Secondary | ICD-10-CM

## 2012-07-01 DIAGNOSIS — I4891 Unspecified atrial fibrillation: Secondary | ICD-10-CM

## 2012-07-01 LAB — CBC WITH DIFFERENTIAL/PLATELET
Basophils Absolute: 0 10*3/uL (ref 0.0–0.1)
Basophils Relative: 0.4 % (ref 0.0–3.0)
Hemoglobin: 10.6 g/dL — ABNORMAL LOW (ref 12.0–15.0)
Lymphocytes Relative: 16.6 % (ref 12.0–46.0)
Monocytes Relative: 8.3 % (ref 3.0–12.0)
Neutro Abs: 3.3 10*3/uL (ref 1.4–7.7)
RBC: 3.44 Mil/uL — ABNORMAL LOW (ref 3.87–5.11)
RDW: 14.8 % — ABNORMAL HIGH (ref 11.5–14.6)

## 2012-07-01 LAB — POCT INR: INR: 3.5

## 2012-07-10 ENCOUNTER — Encounter: Payer: Self-pay | Admitting: Cardiology

## 2012-07-16 ENCOUNTER — Other Ambulatory Visit: Payer: Self-pay | Admitting: *Deleted

## 2012-07-16 ENCOUNTER — Other Ambulatory Visit: Payer: Self-pay | Admitting: Oncology

## 2012-07-16 ENCOUNTER — Telehealth: Payer: Self-pay | Admitting: Oncology

## 2012-07-16 MED ORDER — SYNTHROID 88 MCG PO TABS: 88.0000 ug | ORAL_TABLET | Freq: Every day | ORAL | Status: DC

## 2012-07-19 ENCOUNTER — Other Ambulatory Visit: Payer: Self-pay | Admitting: Oncology

## 2012-07-19 DIAGNOSIS — D631 Anemia in chronic kidney disease: Secondary | ICD-10-CM

## 2012-07-20 ENCOUNTER — Telehealth: Payer: Self-pay | Admitting: *Deleted

## 2012-07-20 NOTE — Telephone Encounter (Signed)
sw pt gv appt the for lab, ov , and tx.gv pt appt d/t for 07/24/12.the pt is aware...td

## 2012-07-20 NOTE — Telephone Encounter (Signed)
Per staff message and POF I have scheduled appts.  JMW  

## 2012-07-21 ENCOUNTER — Other Ambulatory Visit: Payer: Self-pay | Admitting: *Deleted

## 2012-07-21 MED ORDER — METOPROLOL TARTRATE 25 MG PO TABS: ORAL_TABLET | ORAL | Status: DC

## 2012-07-23 ENCOUNTER — Telehealth: Payer: Self-pay | Admitting: Oncology

## 2012-07-23 NOTE — Telephone Encounter (Signed)
C/D 07/23/12 for appt. 07/24/12

## 2012-07-24 ENCOUNTER — Other Ambulatory Visit (HOSPITAL_BASED_OUTPATIENT_CLINIC_OR_DEPARTMENT_OTHER): Payer: Medicare Other | Admitting: Lab

## 2012-07-24 ENCOUNTER — Ambulatory Visit (HOSPITAL_BASED_OUTPATIENT_CLINIC_OR_DEPARTMENT_OTHER): Payer: Medicare Other

## 2012-07-24 ENCOUNTER — Telehealth: Payer: Self-pay | Admitting: Cardiology

## 2012-07-24 ENCOUNTER — Ambulatory Visit (HOSPITAL_BASED_OUTPATIENT_CLINIC_OR_DEPARTMENT_OTHER): Payer: Medicare Other | Admitting: Oncology

## 2012-07-24 ENCOUNTER — Telehealth: Payer: Self-pay | Admitting: Oncology

## 2012-07-24 VITALS — BP 143/78 | HR 80 | Temp 98.5°F | Resp 20 | Ht 60.0 in | Wt 134.0 lb

## 2012-07-24 DIAGNOSIS — D500 Iron deficiency anemia secondary to blood loss (chronic): Secondary | ICD-10-CM

## 2012-07-24 DIAGNOSIS — N039 Chronic nephritic syndrome with unspecified morphologic changes: Secondary | ICD-10-CM

## 2012-07-24 DIAGNOSIS — D649 Anemia, unspecified: Secondary | ICD-10-CM

## 2012-07-24 DIAGNOSIS — R5381 Other malaise: Secondary | ICD-10-CM

## 2012-07-24 LAB — CBC & DIFF AND RETIC
Basophils Absolute: 0 10*3/uL (ref 0.0–0.1)
EOS%: 1.6 % (ref 0.0–7.0)
Eosinophils Absolute: 0.1 10*3/uL (ref 0.0–0.5)
HCT: 29.7 % — ABNORMAL LOW (ref 34.8–46.6)
HGB: 9.6 g/dL — ABNORMAL LOW (ref 11.6–15.9)
Immature Retic Fract: 17.8 % — ABNORMAL HIGH (ref 1.60–10.00)
MCH: 30.9 pg (ref 25.1–34.0)
NEUT#: 3.5 10*3/uL (ref 1.5–6.5)
NEUT%: 79.1 % — ABNORMAL HIGH (ref 38.4–76.8)
Retic Ct Abs: 232.19 10*3/uL — ABNORMAL HIGH (ref 33.70–90.70)
lymph#: 0.5 10*3/uL — ABNORMAL LOW (ref 0.9–3.3)

## 2012-07-24 LAB — FERRITIN: Ferritin: 60 ng/mL (ref 10–291)

## 2012-07-24 MED ORDER — SODIUM CHLORIDE 0.9 % IV SOLN
INTRAVENOUS | Status: AC
  Administered 2012-07-24: 10:00:00 via INTRAVENOUS

## 2012-07-24 MED ORDER — SODIUM CHLORIDE 0.9 % IV SOLN
1020.0000 mg | Freq: Once | INTRAVENOUS | Status: AC
  Administered 2012-07-24: 1020 mg via INTRAVENOUS
  Filled 2012-07-24: qty 34

## 2012-07-24 NOTE — Telephone Encounter (Signed)
Per patient infusion of iron this am.  Dr Darnelle Catalan wants monthly hgb and have them faxed to him [phone]. Standing orders put in system.

## 2012-07-24 NOTE — Telephone Encounter (Signed)
, °

## 2012-07-24 NOTE — Telephone Encounter (Signed)
New Prob     Pt has some questions regarding hemaglobin testing. Would like to speak to nurse.

## 2012-07-24 NOTE — Patient Instructions (Addendum)
Ferumoxytol injection What is this medicine? FERUMOXYTOL is an iron complex. Iron is used to make healthy red blood cells, which carry oxygen and nutrients throughout the body. This medicine is used to treat iron deficiency anemia in people with chronic kidney disease. This medicine may be used for other purposes; ask your health care provider or pharmacist if you have questions. What should I tell my health care provider before I take this medicine? They need to know if you have any of these conditions: -anemia not caused by low iron levels -high levels of iron in the blood -magnetic resonance imaging (MRI) test scheduled -an unusual or allergic reaction to iron, other medicines, foods, dyes, or preservatives -pregnant or trying to get pregnant -breast-feeding How should I use this medicine? This medicine is for infusion into a vein. It is given by a health care professional in a hospital or clinic setting. Talk to your pediatrician regarding the use of this medicine in children. Special care may be needed. Overdosage: If you think you've taken too much of this medicine contact a poison control center or emergency room at once. Overdosage: If you think you have taken too much of this medicine contact a poison control center or emergency room at once. NOTE: This medicine is only for you. Do not share this medicine with others. What if I miss a dose? It is important not to miss your dose. Call your doctor or health care professional if you are unable to keep an appointment. What may interact with this medicine? This medicine may interact with the following medications: -other iron products This list may not describe all possible interactions. Give your health care provider a list of all the medicines, herbs, non-prescription drugs, or dietary supplements you use. Also tell them if you smoke, drink alcohol, or use illegal drugs. Some items may interact with your medicine. What should I watch  for while using this medicine? Visit your doctor or healthcare professional regularly. Tell your doctor or healthcare professional if your symptoms do not start to get better or if they get worse. You may need blood work done while you are taking this medicine. You may need to follow a special diet. Talk to your doctor. Foods that contain iron include: whole grains/cereals, dried fruits, beans, or peas, leafy green vegetables, and organ meats (liver, kidney). What side effects may I notice from receiving this medicine? Side effects that you should report to your doctor or health care professional as soon as possible: -allergic reactions like skin rash, itching or hives, swelling of the face, lips, or tongue -breathing problems -changes in blood pressure -feeling faint or lightheaded, falls -fever or chills -flushing, sweating, or hot feelings -swelling of the ankles or feet Side effects that usually do not require medical attention (Report these to your doctor or health care professional if they continue or are bothersome.): -diarrhea -headache -nausea, vomiting -stomach pain This list may not describe all possible side effects. Call your doctor for medical advice about side effects. You may report side effects to FDA at 1-800-FDA-1088. Where should I keep my medicine? This drug is given in a hospital or clinic and will not be stored at home. NOTE: This sheet is a summary. It may not cover all possible information. If you have questions about this medicine, talk to your doctor, pharmacist, or health care provider.  2013, [employer]. (12/09/2007 9:48:25 PM)  

## 2012-07-24 NOTE — Progress Notes (Signed)
ID: Deborah [name]   DOB: 17-Sep-1926  MR#: [id number]  WUJ#:[id number]  PCP: Cassell Clement, MD GYN:  SU:  OTHER MD: Vida Rigger, Lethea Killings   HISTORY OF PRESENT ILLNESS: Deborah [name]") Deborah [name] is an 79 year old [place] woman referred by Dr. Ewing Schlein for evaluation and treatment of iron deficiency anemia.  INTERVAL HISTORY: The patient has a documented history of chronic GI bleeding, with prior GI evaluation showing a hiatal hernia, multiple gastric polyps, and diverticular disease. She has been treated with various iron preparations, most recently with Niferex, with poor tolerance. Despite everyone's best efforts her hemoglobin has continued to drop, from 12.4 on 06/03/2012  to10.6 on 07/01/2012 and 9.6 today.  REVIEW OF SYSTEMS: She denies severe shortness of breath, fainting, or dizziness. She denies palpitations. His been no unusual headaches, visual changes, cough, phlegm production, or pleurisy. Her stools are black "from the iron". There has not been obvious blood in her stool. She has fairly chronic abdominal discomfort improved with Bentyl. A detailed review of systems today was noncontributory  PAST MEDICAL HISTORY: Past Medical History  Diagnosis Date  . Arrhythmia   . Hypothyroidism   . Cancer     h/o breast cancer  . Stomach problems     seeing Dr Ewing Schlein  . Hypertension     PAST SURGICAL HISTORY: Past Surgical History  Procedure Laterality Date  . Cardiac valve replacement      st jude 1996  . Arteriogram  01/12    NO RAS   . Cardioversion      X 2    FAMILY HISTORY Family History  Problem Relation Age of Onset  . Dementia Mother   . Coronary artery disease Father    the patient's father died from a myocardial infarction at the age of 84. The patient's mother died with Alzheimer's disease at the age of 12. She had 2 brothers and one sister. Her sister had colon cancer diagnosed at age 47. Otherwise the only other person with cancer in the family  was the patient's mother's twin sister, diagnosed with breast cancer at age 57.  GYNECOLOGIC HISTORY: Menarche age 83, first live birth age 77, she is GX P3. Underwent menopause 1972. She took hormone replacement for approximately 21 years.  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 Her daughter Sylvan Cheese runs the [employer] here. Daughter Orie Rout also lives in [place], is a homemaker. Son Molly Maduro "Nadine Counts" Brunke is a Occupational hygienist. The patient has of 10 grandchildren She attends a [employer].   ADVANCED DIRECTIVES: Living will in place. Her daughter Sylvan Cheese is her healthcare power of attorney.  HEALTH MAINTENANCE: History  Substance Use Topics  . Smoking status: Former Smoker    Quit date: 04/01/1988  . Smokeless tobacco: Not on file  . Alcohol Use:      Allergies  Allergen Reactions  . Amiodarone Shortness Of Breath  . Alendronate Sodium     GI upset  . Ciprofloxacin     Rectal bleeding  . Macrodantin (Nitrofurantoin)     GI upset  . Meclizine Swelling    tongue  . Norvasc (Amlodipine Besylate)     edema  . Tussionex Pennkinetic Er (Hydrocod Polst-Cpm Polst Er)     Current Outpatient Prescriptions  Medication Sig Dispense Refill  . allopurinol (ZYLOPRIM) 300 MG tablet TAKE ONE TABLET EVERY DAY  30 tablet  11  . calcium-vitamin D 500 MG tablet Take  1 tablet by mouth 2 (two) times daily.        . CELEBREX 200 MG capsule Take 1 capsule (200 mg total) by mouth daily.  30 each  5  . COUMADIN 5 MG tablet TAKE AS DIRECTED  30 tablet  11  . dicyclomine (BENTYL) 10 MG capsule Take 10 mg by mouth as needed. For stomach cramps      . fexofenadine (ALLEGRA ALLERGY) 180 MG tablet Take 180 mg by mouth daily. For allergies. As needed      . furosemide (LASIX) 40 MG tablet TAKE ONE TABLET EVERY DAY  30 tablet  9  . iron polysaccharides (NIFEREX) 150 MG capsule Take 150 mg by mouth as directed. Takes every other  day      . LANOXIN 0.25 MG tablet TAKE ONE-HALF TABLET DAILY  30 tablet  11  . lansoprazole (PREVACID) 30 MG capsule Take 30 mg by mouth daily.        . metoprolol tartrate (LOPRESSOR) 25 MG tablet 1/2 tablet in the am and 1 tablet int the pm  135 tablet  3  . multivitamin (THERAGRAN) per tablet Take 1 tablet by mouth daily.       Marland Kitchen NITROSTAT 0.4 MG SL tablet ONE TABLET UNDER TONGUE AS NEEDED  25 tablet  12  . Olmesartan-Amlodipine-HCTZ (TRIBENZOR) 40-5-12.5 MG TABS Take 1 tablet by mouth daily.  30 tablet  6  . Omega-3 Fatty Acids (FISH OIL) 1000 MG CAPS Take by mouth 2 (two) times daily.        . potassium chloride SA (K-DUR,KLOR-CON) 20 MEQ tablet daily.       . simvastatin (ZOCOR) 20 MG tablet TAKE ONE-HALF TABLET DAILY OR AS        DIRECTED  30 tablet  10  . SYNTHROID 88 MCG tablet Take 1 tablet (88 mcg total) by mouth daily.  30 tablet  11  . zolpidem (AMBIEN CR) 12.5 MG CR tablet Take 1 tablet (12.5 mg total) by mouth at bedtime as needed for sleep.  30 tablet  5   No current facility-administered medications for this visit.    OBJECTIVE: Elderly white woman in no acute distress Filed Vitals:   07/24/12 0914  BP: 143/78  Pulse: 80  Temp: 98.5 F (36.9 C)  Resp: 20     Body mass index is 26.17 kg/(m^2).    ECOG FS: 1  Sclerae unicteric, no significant pallor Oropharynx clear No cervical or supraclavicular adenopathy Lungs no rales or rhonchi Heart irregular rate Abd soft, nontender, positive bowel sounds, no masses palpated MSK no focal spinal tenderness, no peripheral edema Neuro: nonfocal, well oriented, friendly affect Breasts: Deferred   LAB RESULTS: Lab Results  Component Value Date   WBC 4.5 07/24/2012   NEUTROABS 3.5 07/24/2012   HGB 9.6* 07/24/2012   HCT 29.7* 07/24/2012   MCV 96.0 07/24/2012   PLT 182 07/24/2012      Chemistry      Component Value Date/Time   NA 138 06/03/2012 0950   K 3.9 06/03/2012 0950   CL 100 06/03/2012 0950   CO2 29 06/03/2012 0950   BUN  19 06/03/2012 0950   CREATININE 0.6 06/03/2012 0950      Component Value Date/Time   CALCIUM 9.0 06/03/2012 0950   ALKPHOS 54 06/03/2012 0950   AST 36 06/03/2012 0950   ALT 39* 06/03/2012 0950   BILITOT 0.7 06/03/2012 0950     Ferritin 12 on 12/06/11  No results found for  this basename: LABCA2    No components found with this basename: LABCA125    No results found for this basename: INR,  in the last 168 hours  Urinalysis    Component Value Date/Time   COLORURINE YELLOW 05/21/2012 1018   APPEARANCEUR CLEAR 05/21/2012 1018   LABSPEC 1.010 05/21/2012 1018   PHURINE 7.5 05/21/2012 1018   GLUCOSEU NEG 05/21/2012 1018   HGBUR NEG 05/21/2012 1018   BILIRUBINUR NEG 05/21/2012 1018   KETONESUR NEG 05/21/2012 1018   PROTEINUR 30* 05/21/2012 1018   UROBILINOGEN 0.2 05/21/2012 1018   NITRITE NEG 05/21/2012 1018   LEUKOCYTESUR NEG 05/21/2012 1018    STUDIES: No results found.  ASSESSMENT: 79 y.o. [place] with iron deficiency anemia secondary to chronic blood loss, s/p oral iron supplementation with poor tolerance and inadequate response  (1) Feraheme given 07/24/2012  PLAN: Sara's hemoglobin has been dropping despite her best efforts at oral iron supplementation, and I think she would feel and do better with intravenous iron. She does understand the possible side effects and complications of this form of treatment, but the more modern iron replacement forms are much better tolerated than the previous ones. She will receive Feraheme today, and since she gets labs monthly through Dr. Patty Sermons we can follow her hemoglobin through his clinic. I am obtaining a full set of anemia labs in 6 months and I have made her a return appointment here for 1 year. I would expect her hemoglobin to recover fully within the next 2 months. If it drops below 10 thereafter we would reassess. She knows to call for any other problems that may develop before the next visit here  Hermila Millis C    07/24/2012

## 2012-07-28 ENCOUNTER — Telehealth: Payer: Self-pay | Admitting: Oncology

## 2012-07-28 NOTE — Telephone Encounter (Signed)
Sent letter to Dr. Cassell Clement from Dr. Darnelle Catalan.

## 2012-07-29 ENCOUNTER — Ambulatory Visit (INDEPENDENT_AMBULATORY_CARE_PROVIDER_SITE_OTHER): Payer: Medicare Other | Admitting: Pharmacist

## 2012-07-29 DIAGNOSIS — Z7901 Long term (current) use of anticoagulants: Secondary | ICD-10-CM

## 2012-07-29 DIAGNOSIS — I4891 Unspecified atrial fibrillation: Secondary | ICD-10-CM

## 2012-07-29 DIAGNOSIS — Z954 Presence of other heart-valve replacement: Secondary | ICD-10-CM

## 2012-07-29 DIAGNOSIS — I359 Nonrheumatic aortic valve disorder, unspecified: Secondary | ICD-10-CM

## 2012-07-29 LAB — CBC
MCHC: 32.7 g/dL (ref 30.0–36.0)
RDW: 21.3 % — ABNORMAL HIGH (ref 11.5–14.6)
WBC: 4.8 10*3/uL (ref 4.5–10.5)

## 2012-07-29 LAB — POCT INR: INR: 3.7

## 2012-08-04 ENCOUNTER — Telehealth: Payer: Self-pay | Admitting: Oncology

## 2012-08-04 NOTE — Telephone Encounter (Signed)
Sent letter to Dr. Thomas Brackbill office from Dr. Magrinat °

## 2012-08-05 ENCOUNTER — Telehealth: Payer: Self-pay | Admitting: *Deleted

## 2012-08-05 NOTE — Telephone Encounter (Signed)
Advised patient of lab results  

## 2012-08-05 NOTE — Telephone Encounter (Signed)
Message copied by Burnell Blanks on Wed Aug 05, 2012  8:07 AM ------      Message from: Cassell Clement      Created: Wed Jul 29, 2012  4:13 PM       Hgb stable 10.2 improved. ------

## 2012-08-10 ENCOUNTER — Other Ambulatory Visit: Payer: Self-pay | Admitting: Dermatology

## 2012-08-11 ENCOUNTER — Other Ambulatory Visit: Payer: Self-pay | Admitting: *Deleted

## 2012-08-11 DIAGNOSIS — I4891 Unspecified atrial fibrillation: Secondary | ICD-10-CM

## 2012-08-11 MED ORDER — POTASSIUM CHLORIDE CRYS ER 20 MEQ PO TBCR: 20.0000 meq | EXTENDED_RELEASE_TABLET | Freq: Two times a day (BID) | ORAL | Status: DC

## 2012-08-11 MED ORDER — DIGOXIN 250 MCG PO TABS: ORAL_TABLET | ORAL | Status: DC

## 2012-08-19 ENCOUNTER — Telehealth: Payer: Self-pay | Admitting: Cardiology

## 2012-08-19 ENCOUNTER — Ambulatory Visit (INDEPENDENT_AMBULATORY_CARE_PROVIDER_SITE_OTHER): Payer: Medicare Other | Admitting: *Deleted

## 2012-08-19 DIAGNOSIS — I359 Nonrheumatic aortic valve disorder, unspecified: Secondary | ICD-10-CM

## 2012-08-19 DIAGNOSIS — K922 Gastrointestinal hemorrhage, unspecified: Secondary | ICD-10-CM

## 2012-08-19 DIAGNOSIS — I4891 Unspecified atrial fibrillation: Secondary | ICD-10-CM

## 2012-08-19 DIAGNOSIS — Z7901 Long term (current) use of anticoagulants: Secondary | ICD-10-CM

## 2012-08-19 DIAGNOSIS — D649 Anemia, unspecified: Secondary | ICD-10-CM

## 2012-08-19 DIAGNOSIS — Z954 Presence of other heart-valve replacement: Secondary | ICD-10-CM

## 2012-08-19 LAB — CBC WITH DIFFERENTIAL/PLATELET
Basophils Relative: 0.1 % (ref 0.0–3.0)
Hemoglobin: 10.8 g/dL — ABNORMAL LOW (ref 12.0–15.0)
Lymphocytes Relative: 12.6 % (ref 12.0–46.0)
Monocytes Relative: 7.7 % (ref 3.0–12.0)
Neutro Abs: 4.4 10*3/uL (ref 1.4–7.7)
Neutrophils Relative %: 78.7 % — ABNORMAL HIGH (ref 43.0–77.0)
RBC: 3.33 Mil/uL — ABNORMAL LOW (ref 3.87–5.11)
WBC: 5.5 10*3/uL (ref 4.5–10.5)

## 2012-08-19 LAB — POCT INR: INR: 3.7

## 2012-08-19 NOTE — Telephone Encounter (Signed)
New  Problem   Pt want to know do you have her hemoglobin results she had it done this morning. Please call  pt

## 2012-08-19 NOTE — Telephone Encounter (Signed)
Advised Hgb 10.8 and faxed to Dr Ewing Schlein

## 2012-08-24 NOTE — Progress Notes (Signed)
Hgb improved

## 2012-08-25 ENCOUNTER — Other Ambulatory Visit: Payer: Self-pay | Admitting: *Deleted

## 2012-08-25 DIAGNOSIS — G4700 Insomnia, unspecified: Secondary | ICD-10-CM

## 2012-08-25 MED ORDER — ZOLPIDEM TARTRATE ER 12.5 MG PO TBCR: 12.5000 mg | EXTENDED_RELEASE_TABLET | Freq: Every evening | ORAL | Status: DC | PRN

## 2012-09-03 ENCOUNTER — Ambulatory Visit (INDEPENDENT_AMBULATORY_CARE_PROVIDER_SITE_OTHER): Payer: Medicare Other | Admitting: *Deleted

## 2012-09-03 ENCOUNTER — Encounter: Payer: Self-pay | Admitting: Cardiology

## 2012-09-03 ENCOUNTER — Ambulatory Visit (INDEPENDENT_AMBULATORY_CARE_PROVIDER_SITE_OTHER): Payer: Medicare Other | Admitting: Cardiology

## 2012-09-03 VITALS — BP 138/76 | HR 62 | Ht 61.0 in | Wt 129.8 lb

## 2012-09-03 DIAGNOSIS — I10 Essential (primary) hypertension: Secondary | ICD-10-CM

## 2012-09-03 DIAGNOSIS — I4891 Unspecified atrial fibrillation: Secondary | ICD-10-CM

## 2012-09-03 DIAGNOSIS — Z7901 Long term (current) use of anticoagulants: Secondary | ICD-10-CM

## 2012-09-03 DIAGNOSIS — I359 Nonrheumatic aortic valve disorder, unspecified: Secondary | ICD-10-CM

## 2012-09-03 DIAGNOSIS — E785 Hyperlipidemia, unspecified: Secondary | ICD-10-CM

## 2012-09-03 DIAGNOSIS — Z954 Presence of other heart-valve replacement: Secondary | ICD-10-CM

## 2012-09-03 LAB — CBC WITH DIFFERENTIAL/PLATELET
Basophils Relative: 0.4 % (ref 0.0–3.0)
Eosinophils Absolute: 0 10*3/uL (ref 0.0–0.7)
Eosinophils Relative: 0.6 % (ref 0.0–5.0)
Hemoglobin: 11.3 g/dL — ABNORMAL LOW (ref 12.0–15.0)
Lymphocytes Relative: 18.5 % (ref 12.0–46.0)
MCHC: 32.7 g/dL (ref 30.0–36.0)
MCV: 99.6 fl (ref 78.0–100.0)
Monocytes Absolute: 0.4 10*3/uL (ref 0.1–1.0)
Neutro Abs: 3.5 10*3/uL (ref 1.4–7.7)
Neutrophils Relative %: 71.7 % (ref 43.0–77.0)
RBC: 3.47 Mil/uL — ABNORMAL LOW (ref 3.87–5.11)
WBC: 4.9 10*3/uL (ref 4.5–10.5)

## 2012-09-03 NOTE — Assessment & Plan Note (Signed)
The patient has not been having any symptoms referable to her about her heart disease.  No symptoms of CHF or increased dyspnea.

## 2012-09-03 NOTE — Progress Notes (Signed)
Deborah [name] Date of Birth:  Jul 01, 1926 [facility] [address] [place], [place]  [address] [phone]         Fax   [phone]  History of Present Illness: This pleasant 79 year old woman is seen back for a scheduled followup office visit. . She has a history of previous mechanical aortic valve replacement in 1996 and she has a history of chronic atrial fibrillation and is on Coumadin. She does not have any ischemic heart disease and she had a normal nuclear stress test in March 2012. She has not been experiencing any new cardiac symptoms. She does have a past history of high blood pressure. The patient has had significant GI problems and is followed closely by Dr. Ewing Schlein. She is on iron and her hemoglobin has been improving slowly and she has not been aware of any recent hematochezia. Since last visit her energy level has improved slightly and she has had no new cardiac symptoms.  She has a past history of iron deficiency anemia and is seeing Dr. Arlice Colt who plans to give her intravenous iron if her hemoglobin drops below 10.0.  Current Outpatient Prescriptions  Medication Sig Dispense Refill  . allopurinol (ZYLOPRIM) 300 MG tablet TAKE ONE TABLET EVERY DAY  30 tablet  11  . calcium-vitamin D 500 MG tablet Take 1 tablet by mouth 2 (two) times daily.        . CELEBREX 200 MG capsule Take 1 capsule (200 mg total) by mouth daily.  30 each  5  . COUMADIN 5 MG tablet TAKE AS DIRECTED  30 tablet  11  . dicyclomine (BENTYL) 10 MG capsule Take 10 mg by mouth as needed. For stomach cramps      . digoxin (LANOXIN) 0.25 MG tablet TAKE ONE-HALF TABLET DAILY  30 tablet  11  . fexofenadine (ALLEGRA ALLERGY) 180 MG tablet Take 180 mg by mouth daily. For allergies. As needed      . furosemide (LASIX) 40 MG tablet TAKE ONE TABLET EVERY DAY  30 tablet  9  . iron polysaccharides (NIFEREX) 150 MG capsule Take 150 mg by mouth as directed. Takes every other day      . lansoprazole  (PREVACID) 30 MG capsule Take 30 mg by mouth daily.        . metoprolol tartrate (LOPRESSOR) 25 MG tablet 1/2 tablet in the am and 1 tablet int the pm  135 tablet  3  . multivitamin (THERAGRAN) per tablet Take 1 tablet by mouth daily.       Marland Kitchen NITROSTAT 0.4 MG SL tablet ONE TABLET UNDER TONGUE AS NEEDED  25 tablet  12  . Olmesartan-Amlodipine-HCTZ (TRIBENZOR) 40-5-12.5 MG TABS Take 1 tablet by mouth daily.  30 tablet  6  . Omega-3 Fatty Acids (FISH OIL) 1000 MG CAPS Take by mouth 2 (two) times daily.        . potassium chloride SA (K-DUR,KLOR-CON) 20 MEQ tablet Take 1 tablet (20 mEq total) by mouth 2 (two) times daily.  60 tablet  2  . simvastatin (ZOCOR) 20 MG tablet TAKE ONE-HALF TABLET DAILY OR AS        DIRECTED  30 tablet  10  . SYNTHROID 88 MCG tablet Take 1 tablet (88 mcg total) by mouth daily.  30 tablet  11  . zolpidem (AMBIEN CR) 12.5 MG CR tablet Take 1 tablet (12.5 mg total) by mouth at bedtime as needed for sleep.  30 tablet  5   No current  facility-administered medications for this visit.    Allergies  Allergen Reactions  . Amiodarone Shortness Of Breath  . Alendronate Sodium     GI upset  . Ciprofloxacin     Rectal bleeding  . Macrodantin (Nitrofurantoin)     GI upset  . Meclizine Swelling    tongue  . Norvasc (Amlodipine Besylate)     edema  . Tussionex Pennkinetic Er (Hydrocod Polst-Cpm Polst Er)     Patient Active Problem List   Diagnosis Date Noted  . S/P aortic valve replacement with metallic valve 07/10/2010    Priority: Medium  . Iron deficiency anemia secondary to blood loss (chronic) 07/24/2012  . Malaise and fatigue 05/21/2012  . Dyslipidemia 12/04/2011  . Atrial fibrillation 09/06/2011  . Encounter for long-term (current) use of anticoagulants 08/08/2011  . Nonsustained ventricular tachycardia 07/21/2011  . Hypokalemia 07/20/2011  . Hyponatremia 07/20/2011  . Bronchitis 07/20/2011  . Hypothyroidism   . Hypertension     History  Smoking status    . Former Smoker  . Quit date: 04/01/1988  Smokeless tobacco  . Not on file    History  Alcohol Use     Family History  Problem Relation Age of Onset  . Dementia Mother   . Coronary artery disease Father     Review of Systems: Constitutional: no fever chills diaphoresis or fatigue or change in weight.  Head and neck: no hearing loss, no epistaxis, no photophobia or visual disturbance. Respiratory: No cough, shortness of breath or wheezing. Cardiovascular: No chest pain peripheral edema, palpitations. Gastrointestinal: No abdominal distention, no abdominal pain, no change in bowel habits hematochezia or melena. Genitourinary: No dysuria, no frequency, no urgency, no nocturia. Musculoskeletal:No arthralgias, no back pain, no gait disturbance or myalgias. Neurological: No dizziness, no headaches, no numbness, no seizures, no syncope, no weakness, no tremors. Hematologic: No lymphadenopathy, no easy bruising. Psychiatric: No confusion, no hallucinations, no sleep disturbance.    Physical Exam: Filed Vitals:   09/03/12 1051  BP: 138/76  Pulse: 62   the general appearance reveals a well-developed well-nourished woman in no distress.  She is looking forward to going to the beach tomorrow with 3 other women.The head and neck exam reveals pupils equal and reactive.  Extraocular movements are full.  There is no scleral icterus.  The mouth and pharynx are normal.  The neck is supple.  The carotids reveal no bruits.  The jugular venous pressure is normal.  The  thyroid is not enlarged.  There is no lymphadenopathy.  The chest is clear to percussion and auscultation.  There are no rales or rhonchi.  Expansion of the chest is symmetrical.  The precordium is quiet.  The first heart sound is normal.  The aortic valve click is sharp.  There is no aortic insufficiency murmur  There is no  gallop rub or click.  There is no abnormal lift or heave.  The abdomen is soft and nontender.  The bowel sounds  are normal.  The liver and spleen are not enlarged.  There are no abdominal masses.  There are no abdominal bruits.  Extremities reveal good pedal pulses.  There is no phlebitis or edema.  There is no cyanosis or clubbing.  Strength is normal and symmetrical in all extremities.  There is no lateralizing weakness.  There are no sensory deficits.  The skin is warm and dry.  There is no rash.     Assessment / Plan: Continue on same medications.  Her INR today  is 2.6.  We're checking labs today including CBC.  Recheck in 3 months for followup office visit CBC lipid panel hepatic function panel and basal metabolic panel.

## 2012-09-03 NOTE — Assessment & Plan Note (Signed)
Patient is on simvastatin for her cholesterol.  No side effects noted

## 2012-09-03 NOTE — Assessment & Plan Note (Signed)
Patient has a long history of atrial fibrillation.  She has not had any TIA or stroke symptoms.  She is on long-term Coumadin with INR goal of 2.5-3.5

## 2012-09-03 NOTE — Patient Instructions (Addendum)
Will obtain labs today and call you with the results (lp/bmet/hfp/tsh/cbc)  Your physician recommends that you continue on your current medications as directed. Please refer to the Current Medication list given to you today.  Your physician recommends that you schedule a follow-up appointment in: 3 months with fasting labs (LP/BMET/HFP/CBC)

## 2012-09-03 NOTE — Assessment & Plan Note (Signed)
Blood pressure was remaining stable on current therapy.  No dizziness or syncope.  No headaches 

## 2012-09-21 ENCOUNTER — Ambulatory Visit (INDEPENDENT_AMBULATORY_CARE_PROVIDER_SITE_OTHER): Payer: Medicare Other | Admitting: *Deleted

## 2012-09-21 DIAGNOSIS — I4891 Unspecified atrial fibrillation: Secondary | ICD-10-CM

## 2012-09-21 DIAGNOSIS — Z7901 Long term (current) use of anticoagulants: Secondary | ICD-10-CM

## 2012-09-21 DIAGNOSIS — D649 Anemia, unspecified: Secondary | ICD-10-CM

## 2012-09-21 DIAGNOSIS — I359 Nonrheumatic aortic valve disorder, unspecified: Secondary | ICD-10-CM

## 2012-09-21 DIAGNOSIS — Z954 Presence of other heart-valve replacement: Secondary | ICD-10-CM

## 2012-09-22 ENCOUNTER — Telehealth: Payer: Self-pay | Admitting: *Deleted

## 2012-09-22 LAB — CBC WITH DIFFERENTIAL/PLATELET
Basophils Relative: 0.3 % (ref 0.0–3.0)
Eosinophils Absolute: 0 10*3/uL (ref 0.0–0.7)
Eosinophils Relative: 1.2 % (ref 0.0–5.0)
HCT: 30.4 % — ABNORMAL LOW (ref 36.0–46.0)
Lymphs Abs: 0.6 10*3/uL — ABNORMAL LOW (ref 0.7–4.0)
MCHC: 32.3 g/dL (ref 30.0–36.0)
MCV: 94.5 fl (ref 78.0–100.0)
Monocytes Absolute: 0.2 10*3/uL (ref 0.1–1.0)
Neutrophils Relative %: 76.8 % (ref 43.0–77.0)
RBC: 3.21 Mil/uL — ABNORMAL LOW (ref 3.87–5.11)
WBC: 4 10*3/uL — ABNORMAL LOW (ref 4.5–10.5)

## 2012-09-22 NOTE — Telephone Encounter (Signed)
Advised of Hgb and will forward to Dr Darnelle Catalan. Patient will call his office if she does not hear from them within the next couple of days.

## 2012-09-23 ENCOUNTER — Telehealth: Payer: Self-pay | Admitting: *Deleted

## 2012-09-23 ENCOUNTER — Other Ambulatory Visit: Payer: Self-pay | Admitting: Cardiology

## 2012-09-23 ENCOUNTER — Other Ambulatory Visit: Payer: Self-pay | Admitting: Oncology

## 2012-09-23 DIAGNOSIS — D500 Iron deficiency anemia secondary to blood loss (chronic): Secondary | ICD-10-CM

## 2012-09-23 NOTE — Telephone Encounter (Signed)
Lm gv appt for lab and tx for 09/24/12@ 8am.....td

## 2012-09-23 NOTE — Addendum Note (Signed)
Addended by: Laroy Apple E on: 09/23/2012 02:26 PM   Modules accepted: Orders

## 2012-09-23 NOTE — Telephone Encounter (Signed)
Patient calling in to report that CBC shows Hgb=9.8 on 09/21/12. The plan previously discussed is that patient will have iron transfusion for Hgb<10.0 Patient last had Feraheme 1020mg  on 07/24/12. This has been a drop in Hgb from 11.3 on 09/03/12. Patient does report dark stools this week and states she also reported this to Dr Jenness Corner office with her coumadin check this week.(INR=2.9). She is due to go on vacation for a minimum of 1 week, but potential for 2 weeks this coming Sunday with son and family and needs to know if she needs a transfusion before leaving town. Informed patient that we typically do not transfuse Red Blood Cells unless symptomatic or Hgb<8.0 and iron transfusion will not immediately help if she has acute GI bleeding. Will discuss results with Dr Darnelle Catalan and return call to patient today with plan.

## 2012-09-23 NOTE — Telephone Encounter (Signed)
Called back and spoke to patient, Dr Darnelle Catalan would like patient to come in tomorrow before she leaves town to have labs/IV iron infusion. Informed patient of plan, she asks that when we call back with appts to leave her a message on machine, as she is on her way out the door, she just got a call that her sister has taken a fall and she is headed to hospital to see her. Informed Deborah [name] to call us back in the morning if there are any questions.

## 2012-09-23 NOTE — Telephone Encounter (Signed)
Per staff message and POF I have scheduled appts.  JMW  

## 2012-09-24 ENCOUNTER — Ambulatory Visit (HOSPITAL_BASED_OUTPATIENT_CLINIC_OR_DEPARTMENT_OTHER): Payer: Medicare Other

## 2012-09-24 ENCOUNTER — Other Ambulatory Visit (HOSPITAL_BASED_OUTPATIENT_CLINIC_OR_DEPARTMENT_OTHER): Payer: Medicare Other | Admitting: Lab

## 2012-09-24 VITALS — BP 151/58 | HR 56 | Temp 98.3°F

## 2012-09-24 DIAGNOSIS — D500 Iron deficiency anemia secondary to blood loss (chronic): Secondary | ICD-10-CM

## 2012-09-24 DIAGNOSIS — R5383 Other fatigue: Secondary | ICD-10-CM

## 2012-09-24 LAB — CBC & DIFF AND RETIC
BASO%: 0.3 % (ref 0.0–2.0)
EOS%: 2 % (ref 0.0–7.0)
Eosinophils Absolute: 0.1 10*3/uL (ref 0.0–0.5)
LYMPH%: 15.8 % (ref 14.0–49.7)
MCH: 29 pg (ref 25.1–34.0)
MCHC: 31.1 g/dL — ABNORMAL LOW (ref 31.5–36.0)
MCV: 93.2 fL (ref 79.5–101.0)
MONO%: 8.5 % (ref 0.0–14.0)
Platelets: 214 10*3/uL (ref 145–400)
RBC: 3.1 10*6/uL — ABNORMAL LOW (ref 3.70–5.45)
RDW: 14.8 % — ABNORMAL HIGH (ref 11.2–14.5)
Retic %: 3.2 % — ABNORMAL HIGH (ref 0.70–2.10)
nRBC: 0 % (ref 0–0)

## 2012-09-24 MED ORDER — SODIUM CHLORIDE 0.9 % IV SOLN
Freq: Once | INTRAVENOUS | Status: AC
  Administered 2012-09-24: 09:00:00 via INTRAVENOUS

## 2012-09-24 MED ORDER — SODIUM CHLORIDE 0.9 % IV SOLN
1020.0000 mg | Freq: Once | INTRAVENOUS | Status: AC
  Administered 2012-09-24: 1020 mg via INTRAVENOUS
  Filled 2012-09-24: qty 34

## 2012-09-24 NOTE — Patient Instructions (Signed)
Ferumoxytol injection What is this medicine? FERUMOXYTOL is an iron complex. Iron is used to make healthy red blood cells, which carry oxygen and nutrients throughout the body. This medicine is used to treat iron deficiency anemia in people with chronic kidney disease. This medicine may be used for other purposes; ask your health care provider or pharmacist if you have questions. What should I tell my health care provider before I take this medicine? They need to know if you have any of these conditions: -anemia not caused by low iron levels -high levels of iron in the blood -magnetic resonance imaging (MRI) test scheduled -an unusual or allergic reaction to iron, other medicines, foods, dyes, or preservatives -pregnant or trying to get pregnant -breast-feeding How should I use this medicine? This medicine is for infusion into a vein. It is given by a health care professional in a hospital or clinic setting. Talk to your pediatrician regarding the use of this medicine in children. Special care may be needed. Overdosage: If you think you've taken too much of this medicine contact a poison control center or emergency room at once. Overdosage: If you think you have taken too much of this medicine contact a poison control center or emergency room at once. NOTE: This medicine is only for you. Do not share this medicine with others. What if I miss a dose? It is important not to miss your dose. Call your doctor or health care professional if you are unable to keep an appointment. What may interact with this medicine? This medicine may interact with the following medications: -other iron products This list may not describe all possible interactions. Give your health care provider a list of all the medicines, herbs, non-prescription drugs, or dietary supplements you use. Also tell them if you smoke, drink alcohol, or use illegal drugs. Some items may interact with your medicine. What should I watch  for while using this medicine? Visit your doctor or healthcare professional regularly. Tell your doctor or healthcare professional if your symptoms do not start to get better or if they get worse. You may need blood work done while you are taking this medicine. You may need to follow a special diet. Talk to your doctor. Foods that contain iron include: whole grains/cereals, dried fruits, beans, or peas, leafy green vegetables, and organ meats (liver, kidney). What side effects may I notice from receiving this medicine? Side effects that you should report to your doctor or health care professional as soon as possible: -allergic reactions like skin rash, itching or hives, swelling of the face, lips, or tongue -breathing problems -changes in blood pressure -feeling faint or lightheaded, falls -fever or chills -flushing, sweating, or hot feelings -swelling of the ankles or feet Side effects that usually do not require medical attention (Report these to your doctor or health care professional if they continue or are bothersome.): -diarrhea -headache -nausea, vomiting -stomach pain This list may not describe all possible side effects. Call your doctor for medical advice about side effects. You may report side effects to FDA at 1-800-FDA-1088. Where should I keep my medicine? This drug is given in a hospital or clinic and will not be stored at home. NOTE: This sheet is a summary. It may not cover all possible information. If you have questions about this medicine, talk to your doctor, pharmacist, or health care provider.  2012, Elsevier/Gold Standard. (12/09/2007 9:48:25 PM) 

## 2012-10-05 ENCOUNTER — Other Ambulatory Visit: Payer: Self-pay | Admitting: Cardiology

## 2012-10-08 NOTE — Telephone Encounter (Signed)
This has been an ongoing issue with patient and is followed by GI per  Dr. Patty Sermons

## 2012-10-13 ENCOUNTER — Other Ambulatory Visit: Payer: Self-pay | Admitting: Cardiology

## 2012-10-15 ENCOUNTER — Telehealth: Payer: Self-pay

## 2012-10-15 NOTE — Telephone Encounter (Signed)
Refill sent to pharmacy.   

## 2012-10-15 NOTE — Telephone Encounter (Signed)
Pt is requesting celebrex 200mg  from Dr Patty Sermons is this ok to fill and if so how many?

## 2012-10-19 ENCOUNTER — Ambulatory Visit (INDEPENDENT_AMBULATORY_CARE_PROVIDER_SITE_OTHER): Payer: Medicare Other | Admitting: *Deleted

## 2012-10-19 ENCOUNTER — Other Ambulatory Visit (INDEPENDENT_AMBULATORY_CARE_PROVIDER_SITE_OTHER): Payer: Medicare Other

## 2012-10-19 ENCOUNTER — Telehealth: Payer: Self-pay | Admitting: Cardiology

## 2012-10-19 ENCOUNTER — Other Ambulatory Visit: Payer: Medicare Other

## 2012-10-19 DIAGNOSIS — I4891 Unspecified atrial fibrillation: Secondary | ICD-10-CM

## 2012-10-19 DIAGNOSIS — D649 Anemia, unspecified: Secondary | ICD-10-CM

## 2012-10-19 DIAGNOSIS — I359 Nonrheumatic aortic valve disorder, unspecified: Secondary | ICD-10-CM

## 2012-10-19 DIAGNOSIS — Z7901 Long term (current) use of anticoagulants: Secondary | ICD-10-CM

## 2012-10-19 DIAGNOSIS — Z954 Presence of other heart-valve replacement: Secondary | ICD-10-CM

## 2012-10-19 LAB — CBC WITH DIFFERENTIAL/PLATELET
Basophils Relative: 0.3 % (ref 0.0–3.0)
Eosinophils Absolute: 0 10*3/uL (ref 0.0–0.7)
Hemoglobin: 11.1 g/dL — ABNORMAL LOW (ref 12.0–15.0)
MCHC: 33 g/dL (ref 30.0–36.0)
MCV: 99.1 fl (ref 78.0–100.0)
Monocytes Absolute: 0.4 10*3/uL (ref 0.1–1.0)
Neutro Abs: 3.6 10*3/uL (ref 1.4–7.7)
RBC: 3.4 Mil/uL — ABNORMAL LOW (ref 3.87–5.11)

## 2012-10-19 NOTE — Telephone Encounter (Signed)
New Prob  Pt is wanting the results of her lab work from this morning. She said she has to have it for an appt she has in the morning

## 2012-10-19 NOTE — Progress Notes (Signed)
Quick Note:  Please report to patient. The recent labs are stable. Continue same medication and careful diet. Hgb better 11.1 ______

## 2012-10-19 NOTE — Telephone Encounter (Signed)
Called patient back and gave her results of CBC. She asked that I forward this to Dr.Magod.

## 2012-10-20 ENCOUNTER — Telehealth: Payer: Self-pay | Admitting: *Deleted

## 2012-10-20 NOTE — Telephone Encounter (Signed)
Advised patient of lab results and scheduled follow up lab

## 2012-10-20 NOTE — Telephone Encounter (Signed)
Message copied by Burnell Blanks on Tue Oct 20, 2012 10:10 AM ------      Message from: Cassell Clement      Created: Mon Oct 19, 2012  2:23 PM       Please report to patient.  The recent labs are stable. Continue same medication and careful diet. Hgb better 11.1 ------

## 2012-10-21 ENCOUNTER — Other Ambulatory Visit: Payer: Self-pay | Admitting: Cardiology

## 2012-10-23 ENCOUNTER — Other Ambulatory Visit: Payer: Self-pay | Admitting: *Deleted

## 2012-10-23 MED ORDER — SIMVASTATIN 20 MG PO TABS: ORAL_TABLET | ORAL | Status: DC

## 2012-11-09 ENCOUNTER — Other Ambulatory Visit (INDEPENDENT_AMBULATORY_CARE_PROVIDER_SITE_OTHER): Payer: Medicare Other

## 2012-11-09 ENCOUNTER — Ambulatory Visit (INDEPENDENT_AMBULATORY_CARE_PROVIDER_SITE_OTHER): Payer: Medicare Other | Admitting: Pharmacist

## 2012-11-09 DIAGNOSIS — I4891 Unspecified atrial fibrillation: Secondary | ICD-10-CM

## 2012-11-09 DIAGNOSIS — E785 Hyperlipidemia, unspecified: Secondary | ICD-10-CM

## 2012-11-09 DIAGNOSIS — Z954 Presence of other heart-valve replacement: Secondary | ICD-10-CM

## 2012-11-09 DIAGNOSIS — I359 Nonrheumatic aortic valve disorder, unspecified: Secondary | ICD-10-CM

## 2012-11-09 DIAGNOSIS — Z7901 Long term (current) use of anticoagulants: Secondary | ICD-10-CM

## 2012-11-09 LAB — LIPID PANEL
LDL Cholesterol: 74 mg/dL (ref 0–99)
Total CHOL/HDL Ratio: 3

## 2012-11-09 LAB — HEPATIC FUNCTION PANEL
ALT: 20 U/L (ref 0–35)
AST: 22 U/L (ref 0–37)
Alkaline Phosphatase: 50 U/L (ref 39–117)
Bilirubin, Direct: 0 mg/dL (ref 0.0–0.3)
Total Bilirubin: 0.5 mg/dL (ref 0.3–1.2)

## 2012-11-09 LAB — BASIC METABOLIC PANEL
BUN: 19 mg/dL (ref 6–23)
Chloride: 99 mEq/L (ref 96–112)
Potassium: 4.8 mEq/L (ref 3.5–5.1)

## 2012-11-09 NOTE — Progress Notes (Signed)
Quick Note:  Please report to patient. The recent labs are stable. Continue same medication and careful diet. ______ 

## 2012-11-10 NOTE — Telephone Encounter (Signed)
Follow up  Pt is calling wanting the results of her recent lab test.

## 2012-11-11 ENCOUNTER — Telehealth: Payer: Self-pay | Admitting: *Deleted

## 2012-11-11 ENCOUNTER — Other Ambulatory Visit (INDEPENDENT_AMBULATORY_CARE_PROVIDER_SITE_OTHER): Payer: Medicare Other

## 2012-11-11 DIAGNOSIS — D649 Anemia, unspecified: Secondary | ICD-10-CM

## 2012-11-11 LAB — CBC WITH DIFFERENTIAL/PLATELET
Basophils Absolute: 0 10*3/uL (ref 0.0–0.1)
Eosinophils Absolute: 0 10*3/uL (ref 0.0–0.7)
MCHC: 31.7 g/dL (ref 30.0–36.0)
MCV: 95.6 fl (ref 78.0–100.0)
Monocytes Absolute: 0.3 10*3/uL (ref 0.1–1.0)
Neutrophils Relative %: 76.4 % (ref 43.0–77.0)
Platelets: 206 10*3/uL (ref 150.0–400.0)
RDW: 17.7 % — ABNORMAL HIGH (ref 11.5–14.6)
WBC: 4.3 10*3/uL — ABNORMAL LOW (ref 4.5–10.5)

## 2012-11-11 NOTE — Progress Notes (Signed)
Quick Note:  Please report to patient. The recent labs are stable. Continue same medication and careful diet.Hgb 9.4 lower than last time ______

## 2012-11-11 NOTE — Telephone Encounter (Signed)
Message copied by Burnell Blanks on Wed Nov 11, 2012  9:14 AM ------      Message from: Cassell Clement      Created: Mon Nov 09, 2012  6:43 PM       Please report to patient.  The recent labs are stable. Continue same medication and careful diet. ------

## 2012-11-11 NOTE — Telephone Encounter (Signed)
Advised patient of lab results  

## 2012-11-12 ENCOUNTER — Telehealth: Payer: Self-pay | Admitting: *Deleted

## 2012-11-12 ENCOUNTER — Other Ambulatory Visit: Payer: Self-pay | Admitting: Cardiology

## 2012-11-12 NOTE — Telephone Encounter (Signed)
This RN spoke with pt per need for IV iron due to recent heme drop per Dr Yevonne Pax office. Pt has noted increased weakness.  Per discussion with pt she states she has noticed dark stools " and probably am having some bleeding in my colon but I am avoiding proceeding to a colonoscopy because of being on coumadin ".  Pt is available any day next week per inquiry.  This RN requested and appointment for IV iron post MD return to office on 11/13/2012.  This note will be given to MD.

## 2012-11-13 ENCOUNTER — Telehealth: Payer: Self-pay | Admitting: *Deleted

## 2012-11-13 NOTE — Telephone Encounter (Signed)
Lm gv appt for a labs on 11/17/12 @ 10am with an appt to follow. i emailed mw to add the tx...td

## 2012-11-13 NOTE — Telephone Encounter (Signed)
Per staff message and POF I have scheduled appts. Advised scheduler that lab appt needs to be moved  JMW  

## 2012-11-13 NOTE — Telephone Encounter (Signed)
Lm informed the pt that her lab appt has changed to 11/17/12 @ 12:15pm...td

## 2012-11-15 ENCOUNTER — Other Ambulatory Visit: Payer: Self-pay | Admitting: Oncology

## 2012-11-15 DIAGNOSIS — D509 Iron deficiency anemia, unspecified: Secondary | ICD-10-CM

## 2012-11-16 ENCOUNTER — Encounter (HOSPITAL_COMMUNITY): Payer: Self-pay | Admitting: *Deleted

## 2012-11-16 ENCOUNTER — Telehealth: Payer: Self-pay | Admitting: Cardiology

## 2012-11-16 NOTE — Telephone Encounter (Signed)
Discussed with  Dr. Patty Sermons and he wants her to hold her Warfarin for 5 days prior and do Lovenox bridging. Patient not home, left message. Will forward to Coumadin clinic so they can arrange

## 2012-11-16 NOTE — Telephone Encounter (Signed)
Will forward to  Dr. Brackbill for review 

## 2012-11-16 NOTE — Telephone Encounter (Signed)
colonscopy on 8-26, when to stop coumadin ?

## 2012-11-17 ENCOUNTER — Ambulatory Visit (HOSPITAL_BASED_OUTPATIENT_CLINIC_OR_DEPARTMENT_OTHER): Payer: Medicare Other

## 2012-11-17 ENCOUNTER — Other Ambulatory Visit: Payer: Medicare Other | Admitting: Lab

## 2012-11-17 ENCOUNTER — Encounter (HOSPITAL_COMMUNITY): Payer: Self-pay | Admitting: Pharmacy Technician

## 2012-11-17 ENCOUNTER — Other Ambulatory Visit (HOSPITAL_BASED_OUTPATIENT_CLINIC_OR_DEPARTMENT_OTHER): Payer: Medicare Other | Admitting: Lab

## 2012-11-17 VITALS — BP 130/91 | HR 75 | Temp 98.4°F | Resp 20

## 2012-11-17 DIAGNOSIS — R5381 Other malaise: Secondary | ICD-10-CM

## 2012-11-17 DIAGNOSIS — D509 Iron deficiency anemia, unspecified: Secondary | ICD-10-CM

## 2012-11-17 LAB — CBC & DIFF AND RETIC
BASO%: 0.2 % (ref 0.0–2.0)
Eosinophils Absolute: 0.1 10*3/uL (ref 0.0–0.5)
Immature Retic Fract: 17.2 % — ABNORMAL HIGH (ref 1.60–10.00)
MCHC: 30.7 g/dL — ABNORMAL LOW (ref 31.5–36.0)
MONO#: 0.5 10*3/uL (ref 0.1–0.9)
MONO%: 8.4 % (ref 0.0–14.0)
NEUT#: 4.1 10*3/uL (ref 1.5–6.5)
RBC: 3.15 10*6/uL — ABNORMAL LOW (ref 3.70–5.45)
RDW: 15.5 % — ABNORMAL HIGH (ref 11.2–14.5)
Retic %: 2.94 % — ABNORMAL HIGH (ref 0.70–2.10)
WBC: 5.4 10*3/uL (ref 3.9–10.3)
nRBC: 0 % (ref 0–0)

## 2012-11-17 LAB — FERRITIN CHCC: Ferritin: 33 ng/ml (ref 9–269)

## 2012-11-17 MED ORDER — SODIUM CHLORIDE 0.9 % IV SOLN
INTRAVENOUS | Status: DC
  Administered 2012-11-17: 13:00:00 via INTRAVENOUS

## 2012-11-17 MED ORDER — SODIUM CHLORIDE 0.9 % IV SOLN
1020.0000 mg | Freq: Once | INTRAVENOUS | Status: AC
  Administered 2012-11-17: 1020 mg via INTRAVENOUS
  Filled 2012-11-17: qty 34

## 2012-11-17 NOTE — Telephone Encounter (Signed)
Spoke with Deborah [name] with Dr. Marlane Hatcher office.  Confirmed Dr. Ewing Schlein only wants to hold Coumadin x 2 days prior to procedure.  Colonoscopy is more diagnostic for bleed rather than polyp removal.  Anticipate restarting Coumadin that night.  Will not bridge with Lovenox in this case since pt should not be subtherapeutic for very long.

## 2012-11-17 NOTE — Progress Notes (Signed)
Small bruise above IV site @ discharge; patient reports no pain; warm compress given; patient informed to call office if worsens; verbalized understanding.

## 2012-11-17 NOTE — Telephone Encounter (Signed)
Spoke with pt.  She stated Dr. Ewing Schlein said she only needed to hold Coumadin x 2 days.  She is at their office now.  Asked if she could have the nurse call us to verify instructions and coordinate care.

## 2012-11-17 NOTE — Telephone Encounter (Signed)
Agree with holding warfarin just two days and no lovenox bridge.

## 2012-11-18 ENCOUNTER — Telehealth: Payer: Self-pay | Admitting: Cardiology

## 2012-11-18 NOTE — Telephone Encounter (Signed)
Patient just wanted to make sure  Dr. Patty Sermons aware of only holding warfarin 2 days prior to colonoscopy, advise he was aware and agreed.

## 2012-11-18 NOTE — Telephone Encounter (Signed)
Pt wants a call from melinda re coumadin, only wants melinda to call

## 2012-11-23 ENCOUNTER — Other Ambulatory Visit: Payer: Self-pay | Admitting: Gastroenterology

## 2012-11-23 NOTE — Addendum Note (Signed)
Addended byVida Rigger on: 11/23/2012 12:19 PM   Modules accepted: Orders

## 2012-11-24 ENCOUNTER — Encounter (HOSPITAL_COMMUNITY): Payer: Self-pay | Admitting: Anesthesiology

## 2012-11-24 ENCOUNTER — Ambulatory Visit (HOSPITAL_COMMUNITY): Payer: Medicare Other | Admitting: Anesthesiology

## 2012-11-24 ENCOUNTER — Encounter (HOSPITAL_COMMUNITY)
Admission: RE | Disposition: A | Discharge status: Home / Self Care | Payer: Self-pay | Source: Ambulatory Visit | Attending: Gastroenterology

## 2012-11-24 ENCOUNTER — Encounter (HOSPITAL_COMMUNITY): Payer: Self-pay | Admitting: *Deleted

## 2012-11-24 ENCOUNTER — Ambulatory Visit (HOSPITAL_COMMUNITY)
Admission: RE | Admit: 2012-11-24 | Discharge: 2012-11-24 | Disposition: A | Discharge status: Home / Self Care | Payer: Medicare Other | Source: Ambulatory Visit | Attending: Gastroenterology | Admitting: Gastroenterology

## 2012-11-24 DIAGNOSIS — I4891 Unspecified atrial fibrillation: Secondary | ICD-10-CM | POA: Insufficient documentation

## 2012-11-24 DIAGNOSIS — I472 Ventricular tachycardia, unspecified: Secondary | ICD-10-CM | POA: Insufficient documentation

## 2012-11-24 DIAGNOSIS — E039 Hypothyroidism, unspecified: Secondary | ICD-10-CM | POA: Insufficient documentation

## 2012-11-24 DIAGNOSIS — K5521 Angiodysplasia of colon with hemorrhage: Secondary | ICD-10-CM | POA: Insufficient documentation

## 2012-11-24 DIAGNOSIS — K219 Gastro-esophageal reflux disease without esophagitis: Secondary | ICD-10-CM | POA: Insufficient documentation

## 2012-11-24 DIAGNOSIS — I10 Essential (primary) hypertension: Secondary | ICD-10-CM | POA: Insufficient documentation

## 2012-11-24 DIAGNOSIS — D509 Iron deficiency anemia, unspecified: Secondary | ICD-10-CM | POA: Insufficient documentation

## 2012-11-24 DIAGNOSIS — I4729 Other ventricular tachycardia: Secondary | ICD-10-CM | POA: Insufficient documentation

## 2012-11-24 HISTORY — DX: Anemia, unspecified: D64.9

## 2012-11-24 HISTORY — DX: Unspecified osteoarthritis, unspecified site: M19.90

## 2012-11-24 HISTORY — DX: Pneumonia, unspecified organism: J18.9

## 2012-11-24 HISTORY — DX: Adverse effect of unspecified anesthetic, initial encounter: T41.45XA

## 2012-11-24 HISTORY — DX: Other complications of anesthesia, initial encounter: T88.59XA

## 2012-11-24 HISTORY — PX: COLONOSCOPY WITH PROPOFOL: SHX5780

## 2012-11-24 HISTORY — DX: Gastro-esophageal reflux disease without esophagitis: K21.9

## 2012-11-24 SURGERY — COLONOSCOPY WITH PROPOFOL
Anesthesia: Monitor Anesthesia Care

## 2012-11-24 MED ORDER — ONDANSETRON HCL 4 MG/2ML IJ SOLN
INTRAMUSCULAR | Status: DC | PRN
  Administered 2012-11-24: 4 mg via INTRAVENOUS

## 2012-11-24 MED ORDER — MIDAZOLAM HCL 5 MG/5ML IJ SOLN
INTRAMUSCULAR | Status: DC | PRN
  Administered 2012-11-24: 2 mg via INTRAVENOUS

## 2012-11-24 MED ORDER — LACTATED RINGERS IV SOLN
INTRAVENOUS | Status: DC | PRN
  Administered 2012-11-24: 12:00:00 via INTRAVENOUS

## 2012-11-24 MED ORDER — SODIUM CHLORIDE 0.9 % IV SOLN: INTRAVENOUS | Status: DC

## 2012-11-24 MED ORDER — PROPOFOL INFUSION 10 MG/ML OPTIME
INTRAVENOUS | Status: DC | PRN
  Administered 2012-11-24: 120 ug/kg/min via INTRAVENOUS

## 2012-11-24 MED ORDER — KETAMINE HCL 10 MG/ML IJ SOLN
INTRAMUSCULAR | Status: DC | PRN
  Administered 2012-11-24 (×3): 10 mg via INTRAVENOUS

## 2012-11-24 SURGICAL SUPPLY — 22 items

## 2012-11-24 NOTE — Op Note (Signed)
[facility] [address] [place], [address]   COLONOSCOPY PROCEDURE REPORT  PATIENT: Deborah [name], Deborah [name]  MR#: [id number] BIRTHDATE: [birth year] , 85  yrs. old GENDER: Female ENDOSCOPIST: Vida Rigger, MD REFERRED [id number] Patty Sermons, M.D. PROCEDURE DATE:  11/24/2012 PROCEDURE:   Colonoscopy with tissue ablation ASA CLASS:   Class III INDICATIONS:Iron Deficiency Anemia, Control of bleeding, and bleeding from AV Malformation.  positive Capsule Endoscopy MEDICATIONS: propofol (Diprivan) 200mg  IV and Versed 2 mg IV ketamine 30 mg  DESCRIPTION OF PROCEDURE:   After the risks benefits and alternatives of the procedure were thoroughly explained, informed consent was obtained.  The Pentax Ped Colon [id number]  endoscope was introduced through the anus and advanced to the terminal ileum which was intubated for a short distance , limited by No adverse events experienced.   The quality of the prep was adequate. .  The instrument was then slowly withdrawn as the colon was fully examined.there was no signs of bleeding on insertion and the findings are recorded below and we did fine 1 proximal descending AVM which was cauterized using the APC in the customary fashion and we did not see any other at risk lesions and the scope was slowly withdrawn and the patient tolerated the procedure well there was no obvious immediate complication       FINDINGS: 1. Small internal/external hemorrhoid 2. Rare sigmoid few ascending diverticula 3. Proximal descending small AVM status post APC 4. Otherwise within normal limits to the terminal ileum without signs of active bleeding or other at risk lesions  COMPLICATIONS: none  IMPRESSION:  above  RECOMMENDATIONS: observe for delayed complications if none resume Coumadin tomorrow and call me when necessary and followup in one month   _______________________________ eSigned:  Vida Rigger, MD 11/24/2012 1:30 PM   FA:[id number] Patty Sermons,  MD

## 2012-11-24 NOTE — Transfer of Care (Signed)
Immediate Anesthesia Transfer of Care Note  Patient: Deborah [name]  Procedure(s) Performed: Procedure(s): COLONOSCOPY WITH PROPOFOL (N/A)  Patient Location: PACU  Anesthesia Type:MAC  Level of Consciousness: awake, alert  and oriented  Airway & Oxygen Therapy: Patient Spontanous Breathing and Patient connected to face mask oxygen  Post-op Assessment: Report given to PACU RN and Post -op Vital signs reviewed and stable  Post vital signs: Reviewed and stable  Complications: No apparent anesthesia complications

## 2012-11-24 NOTE — Anesthesia Preprocedure Evaluation (Addendum)
Anesthesia Evaluation  Patient identified by MRN, date of birth, ID band Patient awake    Reviewed: Allergy & Precautions, H&P , NPO status , Patient's Chart, lab work & pertinent test results, reviewed documented beta blocker date and time   Airway Mallampati: II TM Distance: >3 FB Neck ROM: full    Dental no notable dental hx. (+) Teeth Intact and Dental Advisory Given   Pulmonary neg pulmonary ROS,  breath sounds clear to auscultation  Pulmonary exam normal       Cardiovascular Exercise Tolerance: Good hypertension, Pt. on home beta blockers and Pt. on medications + dysrhythmias Atrial Fibrillation and Ventricular Tachycardia Rhythm:Irregular Rate:Normal  AVR.  ECG AF   Neuro/Psych negative neurological ROS  negative psych ROS   GI/Hepatic negative GI ROS, Neg liver ROS, GERD-  Medicated and Controlled,  Endo/Other  negative endocrine ROSHypothyroidism   Renal/GU negative Renal ROS  negative genitourinary   Musculoskeletal   Abdominal   Peds  Hematology  (+) Blood dyscrasia, anemia , hgb 9   Anesthesia Other Findings   Reproductive/Obstetrics negative OB ROS                          Anesthesia Physical Anesthesia Plan  ASA: III  Anesthesia Plan: MAC   Post-op Pain Management:    Induction:   Airway Management Planned: Simple Face Mask  Additional Equipment:   Intra-op Plan:   Post-operative Plan:   Informed Consent: I have reviewed the patients History and Physical, chart, labs and discussed the procedure including the risks, benefits and alternatives for the proposed anesthesia with the patient or authorized representative who has indicated his/her understanding and acceptance.   Dental Advisory Given  Plan Discussed with: CRNA and Surgeon  Anesthesia Plan Comments:         Anesthesia Quick Evaluation

## 2012-11-24 NOTE — Anesthesia Postprocedure Evaluation (Signed)
  Anesthesia Post-op Note  Patient: Deborah [name]  Procedure(s) Performed: Procedure(s) (LRB): COLONOSCOPY WITH PROPOFOL (N/A)  Patient Location: PACU  Anesthesia Type: MAC  Level of Consciousness: awake and alert   Airway and Oxygen Therapy: Patient Spontanous Breathing  Post-op Pain: mild  Post-op Assessment: Post-op Vital signs reviewed, Patient's Cardiovascular Status Stable, Respiratory Function Stable, Patent Airway and No signs of Nausea or vomiting  Last Vitals:  Filed Vitals:   11/24/12 1335  BP: 152/66  Temp:   Resp: 23    Post-op Vital Signs: stable   Complications: No apparent anesthesia complications

## 2012-11-24 NOTE — Progress Notes (Signed)
Deborah [name] 12:45 PM  Subjective: Patient without any new complaints since we saw her in the office and continues to have periodic bleeding which we rediscussed and no other new problems  Objective: Vital signs stable afebrile no acute distress recent labs reviewed physical exam please see pre-assessment evaluation  Assessment: Subacute GI bleeding probably secondary to AVMs and Coumadin with blood on the right side of the colon on recent capsule endoscopy  Plan: Okay to proceed with colonoscopy and anesthesia  Deborah [name] E

## 2012-11-25 ENCOUNTER — Encounter (HOSPITAL_COMMUNITY): Payer: Self-pay | Admitting: Gastroenterology

## 2012-12-08 ENCOUNTER — Ambulatory Visit (INDEPENDENT_AMBULATORY_CARE_PROVIDER_SITE_OTHER): Payer: Medicare Other | Admitting: Pharmacist

## 2012-12-08 DIAGNOSIS — Z954 Presence of other heart-valve replacement: Secondary | ICD-10-CM

## 2012-12-08 DIAGNOSIS — D649 Anemia, unspecified: Secondary | ICD-10-CM

## 2012-12-08 DIAGNOSIS — Z7901 Long term (current) use of anticoagulants: Secondary | ICD-10-CM

## 2012-12-08 DIAGNOSIS — I359 Nonrheumatic aortic valve disorder, unspecified: Secondary | ICD-10-CM

## 2012-12-08 DIAGNOSIS — I4891 Unspecified atrial fibrillation: Secondary | ICD-10-CM

## 2012-12-08 LAB — POCT INR: INR: 1.9

## 2012-12-08 LAB — CBC WITH DIFFERENTIAL/PLATELET
Basophils Absolute: 0 10*3/uL (ref 0.0–0.1)
HCT: 38.4 % (ref 36.0–46.0)
Lymphocytes Relative: 16.1 % (ref 12.0–46.0)
Lymphs Abs: 0.9 10*3/uL (ref 0.7–4.0)
Monocytes Relative: 7.5 % (ref 3.0–12.0)
Platelets: 209 10*3/uL (ref 150.0–400.0)
RDW: 23.3 % — ABNORMAL HIGH (ref 11.5–14.6)

## 2012-12-09 ENCOUNTER — Telehealth: Payer: Self-pay | Admitting: *Deleted

## 2012-12-09 NOTE — Telephone Encounter (Signed)
Advised patient of lab results and sent to Dr Darnelle Catalan

## 2012-12-09 NOTE — Telephone Encounter (Signed)
Message copied by Burnell Blanks on Wed Dec 09, 2012  8:43 AM ------      Message from: Cassell Clement      Created: Wed Dec 09, 2012  5:36 AM       Please report to patient.  The recent labs are stable. Continue same medication and careful diet. Hgb 12.4 ------

## 2012-12-09 NOTE — Progress Notes (Signed)
Quick Note:  Please report to patient. The recent labs are stable. Continue same medication and careful diet. Hgb 12.4 ______

## 2012-12-10 ENCOUNTER — Other Ambulatory Visit: Payer: Self-pay | Admitting: Cardiology

## 2012-12-17 ENCOUNTER — Encounter: Payer: Self-pay | Admitting: Cardiology

## 2012-12-17 ENCOUNTER — Ambulatory Visit (INDEPENDENT_AMBULATORY_CARE_PROVIDER_SITE_OTHER): Payer: Medicare Other | Admitting: Cardiology

## 2012-12-17 ENCOUNTER — Other Ambulatory Visit: Payer: Medicare Other

## 2012-12-17 ENCOUNTER — Ambulatory Visit (INDEPENDENT_AMBULATORY_CARE_PROVIDER_SITE_OTHER): Payer: Medicare Other | Admitting: Pharmacist

## 2012-12-17 VITALS — BP 144/80 | HR 60 | Ht 60.0 in | Wt 128.0 lb

## 2012-12-17 DIAGNOSIS — D500 Iron deficiency anemia secondary to blood loss (chronic): Secondary | ICD-10-CM

## 2012-12-17 DIAGNOSIS — Z954 Presence of other heart-valve replacement: Secondary | ICD-10-CM

## 2012-12-17 DIAGNOSIS — Z7901 Long term (current) use of anticoagulants: Secondary | ICD-10-CM

## 2012-12-17 DIAGNOSIS — M1990 Unspecified osteoarthritis, unspecified site: Secondary | ICD-10-CM

## 2012-12-17 DIAGNOSIS — I4891 Unspecified atrial fibrillation: Secondary | ICD-10-CM

## 2012-12-17 DIAGNOSIS — I359 Nonrheumatic aortic valve disorder, unspecified: Secondary | ICD-10-CM

## 2012-12-17 DIAGNOSIS — I119 Hypertensive heart disease without heart failure: Secondary | ICD-10-CM

## 2012-12-17 LAB — POCT INR: INR: 2.8

## 2012-12-17 NOTE — Progress Notes (Signed)
Deborah [name] Date of Birth:  Apr 30, 1926 [facility] [address] [place], [place]  [address] [phone]         Fax   [phone]  History of Present Illness: This pleasant 79 year old woman is seen back for a scheduled followup office visit. . She has a history of previous mechanical aortic valve replacement in 1996 and she has a history of chronic atrial fibrillation and is on Coumadin. She does not have any ischemic heart disease and she had a normal nuclear stress test in March 2012. She has not been experiencing any new cardiac symptoms. She does have a past history of high blood pressure. The patient has had significant GI problems and is followed closely by Dr. Ewing Schlein. She is on iron and her hemoglobin has been improving slowly and she has not been aware of any recent hematochezia.  Her stools are chronically dark. Since last visit her energy level has improved slightly and she has had no new cardiac symptoms.  She has a past history of iron deficiency anemia and is seeing Dr. Arlice Colt who plans to give her intravenous iron if her hemoglobin drops below 10.0.  She had a recent colonoscopy.  Her warfarin was held successfully for the colonoscopy for just 2 days before the procedure and one day after.  She did not have any thromboembolic sequelae.  Current Outpatient Prescriptions  Medication Sig Dispense Refill  . allopurinol (ZYLOPRIM) 300 MG tablet Take 300 mg by mouth daily.      . calcium-vitamin D 500 MG tablet Take 1 tablet by mouth 2 (two) times daily.        . celecoxib (CELEBREX) 200 MG capsule Take 200 mg by mouth daily.      Marland Kitchen dicyclomine (BENTYL) 10 MG capsule Take 10 mg by mouth as needed. For stomach cramps      . digoxin (LANOXIN) 0.25 MG tablet Take 0.25 mg by mouth every evening.      . fexofenadine (ALLEGRA ALLERGY) 180 MG tablet Take 180 mg by mouth daily. For allergies. As needed      . furosemide (LASIX) 40 MG tablet Take 40 mg by mouth  daily as needed for fluid or edema.      . lansoprazole (PREVACID) 30 MG capsule Take 30 mg by mouth daily.        Marland Kitchen levothyroxine (SYNTHROID, LEVOTHROID) 88 MCG tablet Take 88 mcg by mouth daily before breakfast.      . metoprolol tartrate (LOPRESSOR) 25 MG tablet Take 12.5 mg by mouth 2 (two) times daily.      . multivitamin (THERAGRAN) per tablet Take 1 tablet by mouth daily.       . Naphazoline-Pheniramine (OPCON-A) 0.027-0.315 % SOLN Apply 1 drop to eye 2 (two) times daily as needed (itchy eyes).      . nitroGLYCERIN (NITROSTAT) 0.4 MG SL tablet Place 0.4 mg under the tongue every 5 (five) minutes as needed for chest pain.      . Olmesartan-Amlodipine-HCTZ (TRIBENZOR) 40-5-12.5 MG TABS Take 1 tablet by mouth every morning.      . Omega-3 Fatty Acids (FISH OIL) 1000 MG CAPS Take 1,000 mg by mouth 2 (two) times daily.       . potassium chloride SA (K-DUR,KLOR-CON) 20 MEQ tablet Take 20 mEq by mouth daily.      . simvastatin (ZOCOR) 20 MG tablet Take 10 mg by mouth every evening.      . sodium chloride (OCEAN) 0.65 %  nasal spray Place 1 spray into the nose 2 (two) times daily as needed for congestion.      Marya Landry 40-5-12.5 MG TABS TAKE ONE TABLET BY MOUTH ONCE DAILY  30 tablet  3  . warfarin (COUMADIN) 5 MG tablet Take 2.5-5 mg by mouth daily. Tuesday, Thursday, and Saturday is 5mg  all other days is 2.5 mg      . zolpidem (AMBIEN CR) 12.5 MG CR tablet Take 12.5 mg by mouth at bedtime as needed for sleep.       No current facility-administered medications for this visit.    Allergies  Allergen Reactions  . Amiodarone Shortness Of Breath  . Alendronate Sodium     GI upset  . Macrodantin [Nitrofurantoin]     GI upset  . Meclizine Swelling    tongue  . Norvasc [Amlodipine Besylate]     edema  . Tussionex Pennkinetic Er [Hydrocod Polst-Cpm Polst Er]     Patient Active Problem List   Diagnosis Date Noted  . S/P aortic valve replacement with metallic valve 07/10/2010    Priority:  Medium  . Iron deficiency anemia secondary to blood loss (chronic) 07/24/2012  . Malaise and fatigue 05/21/2012  . Dyslipidemia 12/04/2011  . Atrial fibrillation 09/06/2011  . Encounter for long-term (current) use of anticoagulants 08/08/2011  . Nonsustained ventricular tachycardia 07/21/2011  . Hypokalemia 07/20/2011  . Hyponatremia 07/20/2011  . Bronchitis 07/20/2011  . Hypothyroidism   . Hypertension     History  Smoking status  . Former Smoker  . Quit date: 04/01/1988  Smokeless tobacco  . Not on file    History  Alcohol Use  . Yes    Comment: burbon or glass wine daily    Family History  Problem Relation Age of Onset  . Dementia Mother   . Coronary artery disease Father     Review of Systems: Constitutional: no fever chills diaphoresis or fatigue or change in weight.  Head and neck: no hearing loss, no epistaxis, no photophobia or visual disturbance. Respiratory: No cough, shortness of breath or wheezing. Cardiovascular: No chest pain peripheral edema, palpitations. Gastrointestinal: No abdominal distention, no abdominal pain, no change in bowel habits hematochezia or melena. Genitourinary: No dysuria, no frequency, no urgency, no nocturia. Musculoskeletal:No arthralgias, no back pain, no gait disturbance or myalgias. Neurological: No dizziness, no headaches, no numbness, no seizures, no syncope, no weakness, no tremors. Hematologic: No lymphadenopathy, no easy bruising. Psychiatric: No confusion, no hallucinations, no sleep disturbance.    Physical Exam: Filed Vitals:   12/17/12 0938  BP: 144/80  Pulse: 60   the general appearance reveals a well-developed well-nourished woman in no distress.  She is looking forward to going to the beach tomorrow with 3 other women.The head and neck exam reveals pupils equal and reactive.  Extraocular movements are full.  There is no scleral icterus.  The mouth and pharynx are normal.  The neck is supple.  The carotids reveal  no bruits.  The jugular venous pressure is normal.  The  thyroid is not enlarged.  There is no lymphadenopathy.  The chest is clear to percussion and auscultation.  There are no rales or rhonchi.  Expansion of the chest is symmetrical.  The precordium is quiet.  The first heart sound is normal.  The aortic valve click is sharp.  There is no aortic insufficiency murmur  There is no  gallop rub or click.  There is no abnormal lift or heave.  The abdomen is soft  and nontender.  The bowel sounds are normal.  The liver and spleen are not enlarged.  There are no abdominal masses.  There are no abdominal bruits.  Extremities reveal good pedal pulses.  There is no phlebitis or edema.  There is no cyanosis or clubbing.  Strength is normal and symmetrical in all extremities.  There is no lateralizing weakness.  There are no sensory deficits.  The skin is warm and dry.  There is no rash.     Assessment / Plan: Continue on same medications.  Continue Coumadin for mechanical prosthetic aortic valve.  Continue Celebrex for her symptomatic generalized osteoarthritis.  Return in 3 months for followup office visit.  She gets a hemoglobin checked each time she has her prothrombin time checked once a month.

## 2012-12-17 NOTE — Assessment & Plan Note (Signed)
The patient has chronic blood loss anemia because of her requirement for ongoing Coumadin anticoagulation.  Whenever her hemoglobin drops below 10 she is given intravenous iron therapy by Dr. Arlice Colt.  She does not tolerate oral iron.

## 2012-12-17 NOTE — Assessment & Plan Note (Signed)
The patient has not had any symptoms from her prosthetic aortic valve.  [name] not having symptoms of congestive heart failure.  No angina pectoris.  Energy level is fair

## 2012-12-17 NOTE — Patient Instructions (Addendum)
Your physician recommends that you continue on your current medications as directed. Please refer to the Current Medication list given to you today.  Your physician recommends that you schedule a follow-up appointment in: 3 month ov 

## 2012-12-17 NOTE — Assessment & Plan Note (Signed)
Blood pressure was remaining stable on current medication.  No headaches or dizziness.  No symptoms of CHF

## 2012-12-21 ENCOUNTER — Telehealth: Payer: Self-pay | Admitting: Cardiology

## 2012-12-21 DIAGNOSIS — D500 Iron deficiency anemia secondary to blood loss (chronic): Secondary | ICD-10-CM

## 2012-12-21 NOTE — Telephone Encounter (Signed)
Pt states she would like to have an order for a hemoglobin venipuncture on the day she comes in for coumadin check 12/31/12. Please call her back and let her know if this is ok.

## 2012-12-21 NOTE — Telephone Encounter (Signed)
Left message ok to get 12/31/12

## 2012-12-30 ENCOUNTER — Encounter: Payer: Self-pay | Admitting: Cardiology

## 2012-12-31 ENCOUNTER — Ambulatory Visit (INDEPENDENT_AMBULATORY_CARE_PROVIDER_SITE_OTHER): Payer: Medicare Other | Admitting: *Deleted

## 2012-12-31 ENCOUNTER — Other Ambulatory Visit (INDEPENDENT_AMBULATORY_CARE_PROVIDER_SITE_OTHER): Payer: Medicare Other

## 2012-12-31 ENCOUNTER — Telehealth: Payer: Self-pay | Admitting: Cardiology

## 2012-12-31 ENCOUNTER — Encounter: Payer: Self-pay | Admitting: Cardiology

## 2012-12-31 DIAGNOSIS — I359 Nonrheumatic aortic valve disorder, unspecified: Secondary | ICD-10-CM

## 2012-12-31 DIAGNOSIS — D500 Iron deficiency anemia secondary to blood loss (chronic): Secondary | ICD-10-CM

## 2012-12-31 DIAGNOSIS — Z954 Presence of other heart-valve replacement: Secondary | ICD-10-CM

## 2012-12-31 DIAGNOSIS — Z7901 Long term (current) use of anticoagulants: Secondary | ICD-10-CM

## 2012-12-31 DIAGNOSIS — I4891 Unspecified atrial fibrillation: Secondary | ICD-10-CM

## 2012-12-31 NOTE — Telephone Encounter (Signed)
Message copied by Burnell Blanks on Thu Dec 31, 2012  5:10 PM ------      Message from: Cassell Clement      Created: Thu Dec 31, 2012  1:03 PM       Please report to patient.  The recent labs are stable. Continue same medication and careful diet.  Hemoglobin 10.8 ------

## 2012-12-31 NOTE — Telephone Encounter (Signed)
Follow Up   Returning call for test results.Marland Kitchen

## 2012-12-31 NOTE — Telephone Encounter (Signed)
Advised patient

## 2012-12-31 NOTE — Progress Notes (Signed)
Quick Note:  Please report to patient. The recent labs are stable. Continue same medication and careful diet. Hemoglobin 10.8 ______

## 2013-01-11 ENCOUNTER — Ambulatory Visit (INDEPENDENT_AMBULATORY_CARE_PROVIDER_SITE_OTHER): Payer: Medicare Other | Admitting: Pharmacist

## 2013-01-11 DIAGNOSIS — Z954 Presence of other heart-valve replacement: Secondary | ICD-10-CM

## 2013-01-11 DIAGNOSIS — Z7901 Long term (current) use of anticoagulants: Secondary | ICD-10-CM

## 2013-01-11 DIAGNOSIS — I4891 Unspecified atrial fibrillation: Secondary | ICD-10-CM

## 2013-01-11 DIAGNOSIS — I359 Nonrheumatic aortic valve disorder, unspecified: Secondary | ICD-10-CM

## 2013-01-11 LAB — POCT INR: INR: 3.6

## 2013-01-20 ENCOUNTER — Telehealth: Payer: Self-pay | Admitting: *Deleted

## 2013-01-20 ENCOUNTER — Other Ambulatory Visit (HOSPITAL_BASED_OUTPATIENT_CLINIC_OR_DEPARTMENT_OTHER): Payer: Medicare Other

## 2013-01-20 ENCOUNTER — Other Ambulatory Visit: Payer: Self-pay | Admitting: *Deleted

## 2013-01-20 DIAGNOSIS — R5381 Other malaise: Secondary | ICD-10-CM

## 2013-01-20 DIAGNOSIS — D500 Iron deficiency anemia secondary to blood loss (chronic): Secondary | ICD-10-CM

## 2013-01-20 DIAGNOSIS — D509 Iron deficiency anemia, unspecified: Secondary | ICD-10-CM

## 2013-01-20 LAB — CBC & DIFF AND RETIC
BASO%: 0.2 % (ref 0.0–2.0)
EOS%: 0.9 % (ref 0.0–7.0)
HCT: 27.2 % — ABNORMAL LOW (ref 34.8–46.6)
Immature Retic Fract: 14.6 % — ABNORMAL HIGH (ref 1.60–10.00)
LYMPH%: 11 % — ABNORMAL LOW (ref 14.0–49.7)
MCH: 29.4 pg (ref 25.1–34.0)
MCHC: 31.3 g/dL — ABNORMAL LOW (ref 31.5–36.0)
MCV: 94.1 fL (ref 79.5–101.0)
MONO%: 7 % (ref 0.0–14.0)
NEUT%: 80.9 % — ABNORMAL HIGH (ref 38.4–76.8)
lymph#: 0.5 10*3/uL — ABNORMAL LOW (ref 0.9–3.3)

## 2013-01-20 LAB — COMPREHENSIVE METABOLIC PANEL (CC13)
Anion Gap: 7 mEq/L (ref 3–11)
BUN: 15.9 mg/dL (ref 7.0–26.0)
CO2: 27 mEq/L (ref 22–29)
Calcium: 9.6 mg/dL (ref 8.4–10.4)
Creatinine: 0.7 mg/dL (ref 0.6–1.1)
Glucose: 133 mg/dl (ref 70–140)
Total Bilirubin: 0.28 mg/dL (ref 0.20–1.20)

## 2013-01-20 LAB — FERRITIN CHCC: Ferritin: 24 ng/ml (ref 9–269)

## 2013-01-20 LAB — IRON AND TIBC CHCC
%SAT: 4 % — ABNORMAL LOW (ref 21–57)
Iron: 19 ug/dL — ABNORMAL LOW (ref 41–142)
TIBC: 494 ug/dL — ABNORMAL HIGH (ref 236–444)

## 2013-01-20 NOTE — Progress Notes (Signed)
Patient in for lab only, compaints of feeling bad and really tired lately. Informed patient that we would get all labs including iron studies today and then once reviewed by Dr Darnelle Catalan be able to better determine if low iron levels could be contributing to this. This nurse will call patient once all labs have been reviewed by Dr Darnelle Catalan.

## 2013-01-20 NOTE — Telephone Encounter (Signed)
Called patient to discuss labs for lab appt only today. Patient states she is sluggish, tired and fatigued. She can tell that "blood has dropped". INR last week was 3.6 with Dr Patty Sermons and stools are black, as usual. Nothing "unusual", dyspnea that improves with rest. Denies any chest pain or palpitations. Informed patient once Dr Darnelle Catalan reviews labs and has a plan we would call her with a schedule.

## 2013-01-21 ENCOUNTER — Other Ambulatory Visit: Payer: Self-pay | Admitting: Cardiology

## 2013-01-21 ENCOUNTER — Other Ambulatory Visit: Payer: Self-pay | Admitting: Oncology

## 2013-01-21 ENCOUNTER — Telehealth: Payer: Self-pay | Admitting: *Deleted

## 2013-01-21 DIAGNOSIS — D649 Anemia, unspecified: Secondary | ICD-10-CM

## 2013-01-21 MED ORDER — INFLUENZA VAC SPLIT QUAD 0.5 ML IM SUSP
0.5000 mL | Freq: Once | INTRAMUSCULAR | Status: DC
  Filled 2013-01-21: qty 0.5

## 2013-01-21 NOTE — Telephone Encounter (Signed)
Per Dr Darnelle Catalan, patient needs to be scheduled for IV Feraheme.

## 2013-01-22 ENCOUNTER — Ambulatory Visit (HOSPITAL_BASED_OUTPATIENT_CLINIC_OR_DEPARTMENT_OTHER): Payer: Medicare Other

## 2013-01-22 VITALS — Temp 97.0°F

## 2013-01-22 DIAGNOSIS — Z23 Encounter for immunization: Secondary | ICD-10-CM

## 2013-01-22 DIAGNOSIS — D509 Iron deficiency anemia, unspecified: Secondary | ICD-10-CM

## 2013-01-22 DIAGNOSIS — R5381 Other malaise: Secondary | ICD-10-CM

## 2013-01-22 MED ORDER — INFLUENZA VAC SPLIT QUAD 0.5 ML IM SUSP
0.5000 mL | Freq: Once | INTRAMUSCULAR | Status: AC
  Administered 2013-01-22: 0.5 mL via INTRAMUSCULAR
  Filled 2013-01-22: qty 0.5

## 2013-01-22 MED ORDER — SODIUM CHLORIDE 0.9 % IV SOLN
1020.0000 mg | Freq: Once | INTRAVENOUS | Status: AC
  Administered 2013-01-22: 1020 mg via INTRAVENOUS
  Filled 2013-01-22: qty 34

## 2013-01-22 MED ORDER — SODIUM CHLORIDE 0.9 % IV SOLN: INTRAVENOUS | Status: DC

## 2013-01-26 ENCOUNTER — Ambulatory Visit (INDEPENDENT_AMBULATORY_CARE_PROVIDER_SITE_OTHER): Payer: Medicare Other | Admitting: Pharmacist

## 2013-01-26 DIAGNOSIS — Z7901 Long term (current) use of anticoagulants: Secondary | ICD-10-CM

## 2013-01-26 DIAGNOSIS — Z954 Presence of other heart-valve replacement: Secondary | ICD-10-CM

## 2013-01-26 DIAGNOSIS — I359 Nonrheumatic aortic valve disorder, unspecified: Secondary | ICD-10-CM

## 2013-01-26 DIAGNOSIS — I4891 Unspecified atrial fibrillation: Secondary | ICD-10-CM

## 2013-01-26 LAB — POCT INR: INR: 2.7

## 2013-01-27 ENCOUNTER — Other Ambulatory Visit: Payer: Self-pay | Admitting: Oncology

## 2013-01-27 ENCOUNTER — Ambulatory Visit (HOSPITAL_BASED_OUTPATIENT_CLINIC_OR_DEPARTMENT_OTHER): Payer: Medicare Other | Admitting: Oncology

## 2013-01-27 ENCOUNTER — Telehealth: Payer: Self-pay | Admitting: Oncology

## 2013-01-27 VITALS — BP 153/71 | HR 80 | Temp 97.7°F | Resp 17 | Ht 60.0 in | Wt 122.6 lb

## 2013-01-27 DIAGNOSIS — I4891 Unspecified atrial fibrillation: Secondary | ICD-10-CM

## 2013-01-27 DIAGNOSIS — K922 Gastrointestinal hemorrhage, unspecified: Secondary | ICD-10-CM

## 2013-01-27 DIAGNOSIS — D500 Iron deficiency anemia secondary to blood loss (chronic): Secondary | ICD-10-CM

## 2013-01-27 DIAGNOSIS — Z954 Presence of other heart-valve replacement: Secondary | ICD-10-CM

## 2013-01-27 DIAGNOSIS — D689 Coagulation defect, unspecified: Secondary | ICD-10-CM | POA: Insufficient documentation

## 2013-01-27 NOTE — Progress Notes (Signed)
ID: Deborah [name]   DOB: [birth year]  MR#: [id number]  WUJ#:[id number]  PCP: Cassell Clement, MD GYN:  SU:  OTHER MD: Vida Rigger, Lethea Killings   HISTORY OF PRESENT ILLNESS: Deborah [name] is an 79 year old [place] woman referred by Dr. Ewing Schlein for evaluation and treatment of iron deficiency anemia.  INTERVAL HISTORY: The patient returns today with her daughter Deborah [name], after receiving a second dose of Deborah [name] HEENT last week for hemoglobin under 9 and a ferritin under 25. She is here to discuss how to manage this problem in the future.  REVIEW OF SYSTEMS: She has "no energy". She "can't get anything done". When her hemoglobin is about 10 she does much better. She does complain of shortness of breath, even at rest, more so when climbing stairs. She usually gets to the top of the stairs, though, without having to stop. Aside from feeling "draggy", she says that her stools have been looking black uncle recently. They now look normal in color and are normal in consistency. She really hates to have her blood drawn and tells me her left arm is the only one that can be used" it is about used up". Aside from these problems, a detailed review of systems today was noncontributory  PAST MEDICAL HISTORY: Past Medical History  Diagnosis Date  . Hypothyroidism   . Cancer     h/o breast cancer  . Stomach problems     seeing Dr Ewing Schlein  . Hypertension   . Pneumonia   . GERD (gastroesophageal reflux disease)   . Anemia   . Arthritis   . Complication of anesthesia     could not sleep after  . Arrhythmia     Atrial Fibrillation    PAST SURGICAL HISTORY: Past Surgical History  Procedure Laterality Date  . Cardiac valve replacement      st jude 1996  . Arteriogram  01/12    NO RAS   . Cardioversion      X 2  . Breast surgery      right lumpectomy  . Cardiac catheterization    . Eye surgery      bilateral cataract with lens implants  . Cholecystectomy    . Abdominal  hysterectomy    . Appendectomy    . Colonoscopy with propofol N/A 11/24/2012    Procedure: COLONOSCOPY WITH PROPOFOL;  Surgeon: Petra Kuba, MD;  Location: [facility];  Service: Endoscopy;  Laterality: N/A;    FAMILY HISTORY Family History  Problem Relation Age of Onset  . Dementia Mother   . Coronary artery disease Father    the patient's father died from a myocardial infarction at the age of 78. The patient's mother died with Alzheimer's disease at the age of 24. She had 2 brothers and one sister. Her sister had colon cancer diagnosed at age 34. Otherwise the only other person with cancer in the family was the patient's mother's twin sister, diagnosed with breast cancer at age 15.  GYNECOLOGIC HISTORY: Menarche age 58, first live birth age 9, she is GX P3. Underwent menopause 1972. She took hormone replacement for approximately 21 years.  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 Her daughter Deborah [name] runs the [employer] here. Daughter Deborah [name] also lives in [place], is a homemaker. Son Deborah [name] is a Occupational hygienist. The patient has of 10 grandchildren She attends a [employer].   ADVANCED DIRECTIVES: Living will in  place. Her daughter Deborah [name] is her healthcare power of attorney.  HEALTH MAINTENANCE: History  Substance Use Topics  . Smoking status: Former Smoker    Quit date: 04/01/1988  . Smokeless tobacco: Not on file  . Alcohol Use: Yes     Comment: burbon or glass wine daily     Allergies  Allergen Reactions  . Amiodarone Shortness Of Breath  . Alendronate Sodium     GI upset  . Macrodantin [Nitrofurantoin]     GI upset  . Meclizine Swelling    tongue  . Norvasc [Amlodipine Besylate]     edema  . Tussionex Pennkinetic Er [Hydrocod Polst-Cpm Polst Er]     Current Outpatient Prescriptions  Medication Sig Dispense Refill  . allopurinol (ZYLOPRIM) 300 MG tablet Take 300 mg by  mouth daily.      . calcium-vitamin D 500 MG tablet Take 1 tablet by mouth 2 (two) times daily.        . celecoxib (CELEBREX) 200 MG capsule Take 200 mg by mouth daily.      Marland Kitchen dicyclomine (BENTYL) 10 MG capsule Take 10 mg by mouth as needed. For stomach cramps      . digoxin (LANOXIN) 0.25 MG tablet Take 0.25 mg by mouth every evening.      . fexofenadine (ALLEGRA ALLERGY) 180 MG tablet Take 180 mg by mouth daily. For allergies. As needed      . furosemide (LASIX) 40 MG tablet Take 40 mg by mouth daily as needed for fluid or edema.      . furosemide (LASIX) 40 MG tablet TAKE ONE TABLET EACH DAY  30 tablet  2  . lansoprazole (PREVACID) 30 MG capsule Take 30 mg by mouth daily.        Marland Kitchen levothyroxine (SYNTHROID, LEVOTHROID) 88 MCG tablet Take 88 mcg by mouth daily before breakfast.      . metoprolol tartrate (LOPRESSOR) 25 MG tablet Take 12.5 mg by mouth 2 (two) times daily.      . multivitamin (THERAGRAN) per tablet Take 1 tablet by mouth daily.       . Naphazoline-Pheniramine (OPCON-A) 0.027-0.315 % SOLN Apply 1 drop to eye 2 (two) times daily as needed (itchy eyes).      . nitroGLYCERIN (NITROSTAT) 0.4 MG SL tablet Place 0.4 mg under the tongue every 5 (five) minutes as needed for chest pain.      . Olmesartan-Amlodipine-HCTZ (TRIBENZOR) 40-5-12.5 MG TABS Take 1 tablet by mouth every morning.      . Omega-3 Fatty Acids (FISH OIL) 1000 MG CAPS Take 1,000 mg by mouth 2 (two) times daily.       . potassium chloride SA (K-DUR,KLOR-CON) 20 MEQ tablet Take 20 mEq by mouth daily.      . simvastatin (ZOCOR) 20 MG tablet Take 10 mg by mouth every evening.      . sodium chloride (OCEAN) 0.65 % nasal spray Place 1 spray into the nose 2 (two) times daily as needed for congestion.      Marya Landry 40-5-12.5 MG TABS TAKE ONE TABLET BY MOUTH ONCE DAILY  30 tablet  3  . warfarin (COUMADIN) 5 MG tablet Take 2.5-5 mg by mouth daily. Tuesday, Thursday, and Saturday is 5mg  all other days is 2.5 mg      . zolpidem  (AMBIEN CR) 12.5 MG CR tablet Take 12.5 mg by mouth at bedtime as needed for sleep.       No current facility-administered medications for this visit.  Facility-Administered Medications Ordered in Other Visits  Medication Dose Route Frequency Provider Last Rate Last Dose  . influenza vac split quadrivalent PF (FLUARIX) injection 0.5 mL  0.5 mL Intramuscular Once Lowella Dell, MD        OBJECTIVE: Elderly white woman who turns 82 today Filed Vitals:   01/27/13 0947  BP: 153/71  Pulse: 80  Temp: 97.7 F (36.5 C)  Resp: 17     Body mass index is 23.94 kg/(m^2).    ECOG FS: 2  Sclerae unicteri, pupils reactive to light Oropharynx no thrush or other lesions No cervical or supraclavicular adenopathy Lungs no rales or rhonchi Heart irregular rate, valvular "click" audible Abd soft, nontender, positive bowel sounds, no masses palpated MSK no focal spinal tenderness, no peripheral edema Neuro: nonfocal, well oriented, friendly affect Breasts: Deferred   LAB RESULTS: Lab Results  Component Value Date   WBC 4.3 01/20/2013   NEUTROABS 3.5 01/20/2013   HGB 8.5* 01/20/2013   HCT 27.2* 01/20/2013   MCV 94.1 01/20/2013   PLT 197 01/20/2013      Chemistry      Component Value Date/Time   NA 130* 01/20/2013 1004   NA 135 11/09/2012 1052   K 4.6 01/20/2013 1004   K 4.8 11/09/2012 1052   CL 99 11/09/2012 1052   CO2 27 01/20/2013 1004   CO2 30 11/09/2012 1052   BUN 15.9 01/20/2013 1004   BUN 19 11/09/2012 1052   CREATININE 0.7 01/20/2013 1004   CREATININE 0.7 11/09/2012 1052      Component Value Date/Time   CALCIUM 9.6 01/20/2013 1004   CALCIUM 9.4 11/09/2012 1052   ALKPHOS 56 01/20/2013 1004   ALKPHOS 50 11/09/2012 1052   AST 17 01/20/2013 1004   AST 22 11/09/2012 1052   ALT 20 01/20/2013 1004   ALT 20 11/09/2012 1052   BILITOT 0.28 01/20/2013 1004   BILITOT 0.5 11/09/2012 1052      No results found for this basename: LABCA2    No components found with this basename:  LABCA125     Recent Labs Lab 01/26/13 1026  INR 2.7    Urinalysis    Component Value Date/Time   COLORURINE YELLOW 05/21/2012 1018   APPEARANCEUR CLEAR 05/21/2012 1018   LABSPEC 1.010 05/21/2012 1018   PHURINE 7.5 05/21/2012 1018   GLUCOSEU NEG 05/21/2012 1018   HGBUR NEG 05/21/2012 1018   BILIRUBINUR NEG 05/21/2012 1018   KETONESUR NEG 05/21/2012 1018   PROTEINUR 30* 05/21/2012 1018   UROBILINOGEN 0.2 05/21/2012 1018   NITRITE NEG 05/21/2012 1018   LEUKOCYTESUR NEG 05/21/2012 1018    STUDIES: No results found.  ASSESSMENT: 79 y.o. [place] with iron deficiency anemia secondary to chronic blood loss, s/p oral iron supplementation with poor tolerance and inadequate response  (1) Feraheme given 07/24/2012, repeated 01/22/2013  PLAN: Huntley Dec continues to have what is almost certainly going to be an occult GI bleed, excess her beta do by her coumadinization. We discussed the fact that the coumadinization is very important, and that it would be very dangerous for her not to seeing her blood. Accordingly she is likely to continue to lose a drop of blood here and there leading to arm deficiency anemia as before.  She is very uncomfortable with her current hemoglobin. We gave her iron a few days ago and I am hopeful within a couple of weeks the hemoglobin will be over 10 or close to it.  However I think in the future we are  going to have to follow her more closely. I propose that we check her labwork every month and proceed to the iron supplementation whenever her ferritin drops below 100. Since she has tolerated the therapy and without any side effects, this should not be a difficult thing to do.  Side is very much in agreement with this plan. She would like [place] to start checking her Coumadin here as well, since she prefers to have it done by fingerstick instead of by intravenous draw. We have placed those orders and she will see me again in 6 months, but of course we will intervene as needed to  keep her hemoglobin above 10 and her ferritin over 100 if possible.  She and her daughter Deborah [name] know to call for any problems that may develop before the next visit here.  Kadir Azucena C    01/27/2013

## 2013-01-28 ENCOUNTER — Telehealth: Payer: Self-pay | Admitting: Cardiology

## 2013-01-28 NOTE — Telephone Encounter (Signed)
New message    Want to talk to Deborah [name]-----We started talking about her birthday (which) was yesterday----I forgot why she want to talk to you.

## 2013-01-28 NOTE — Telephone Encounter (Signed)
Left message to call back  

## 2013-01-29 NOTE — Telephone Encounter (Signed)
Patient is going to start getting INR's at Dr Magrinat's office,  Dr. Patty Sermons ok with them following. Advised patient

## 2013-01-29 NOTE — Telephone Encounter (Signed)
Follow Up: ° °Pt states she is returning Melinda's call °

## 2013-01-30 ENCOUNTER — Other Ambulatory Visit: Payer: Self-pay | Admitting: Oncology

## 2013-02-01 ENCOUNTER — Encounter: Payer: Self-pay | Admitting: Cardiology

## 2013-02-02 ENCOUNTER — Other Ambulatory Visit: Payer: Self-pay | Admitting: Cardiology

## 2013-02-10 ENCOUNTER — Ambulatory Visit (HOSPITAL_BASED_OUTPATIENT_CLINIC_OR_DEPARTMENT_OTHER): Payer: Medicare Other | Admitting: Lab

## 2013-02-10 ENCOUNTER — Ambulatory Visit (HOSPITAL_BASED_OUTPATIENT_CLINIC_OR_DEPARTMENT_OTHER): Payer: Medicare Other | Admitting: Pharmacist

## 2013-02-10 DIAGNOSIS — Z7901 Long term (current) use of anticoagulants: Secondary | ICD-10-CM

## 2013-02-10 DIAGNOSIS — I359 Nonrheumatic aortic valve disorder, unspecified: Secondary | ICD-10-CM

## 2013-02-10 DIAGNOSIS — D689 Coagulation defect, unspecified: Secondary | ICD-10-CM

## 2013-02-10 DIAGNOSIS — Z954 Presence of other heart-valve replacement: Secondary | ICD-10-CM

## 2013-02-10 DIAGNOSIS — I4891 Unspecified atrial fibrillation: Secondary | ICD-10-CM

## 2013-02-10 DIAGNOSIS — D500 Iron deficiency anemia secondary to blood loss (chronic): Secondary | ICD-10-CM

## 2013-02-10 LAB — PROTIME-INR: Protime: 34.8 Seconds — ABNORMAL HIGH (ref 10.6–13.4)

## 2013-02-10 LAB — CBC WITH DIFFERENTIAL/PLATELET
Basophils Absolute: 0 10*3/uL (ref 0.0–0.1)
Eosinophils Absolute: 0 10*3/uL (ref 0.0–0.5)
HCT: 38.9 % (ref 34.8–46.6)
HGB: 12.3 g/dL (ref 11.6–15.9)
LYMPH%: 13.2 % — ABNORMAL LOW (ref 14.0–49.7)
MCHC: 31.6 g/dL (ref 31.5–36.0)
MONO#: 0.3 10*3/uL (ref 0.1–0.9)
NEUT#: 3.2 10*3/uL (ref 1.5–6.5)
NEUT%: 77.7 % — ABNORMAL HIGH (ref 38.4–76.8)
Platelets: 173 10*3/uL (ref 145–400)
WBC: 4.2 10*3/uL (ref 3.9–10.3)
lymph#: 0.5 10*3/uL — ABNORMAL LOW (ref 0.9–3.3)

## 2013-02-10 LAB — FERRITIN CHCC: Ferritin: 252 ng/ml (ref 9–269)

## 2013-02-10 LAB — POCT INR: INR: 2.9

## 2013-02-10 NOTE — Progress Notes (Signed)
INR 2.9    Goal 2.5-3.5  Patient new to [facility], but has been long term [facility] patient. She is S/P Aortic valve replacement in 1996. She has a slow GI bleed per Dr. Darnelle Catalan, he will draw fingerstick CBC with each CC visit.  H/H WNL today. Spoke with MD.  He is drawing monthly ferritin levels, next on 03/10/13.  He will replete iron as necessary. Patient states she has had recent fecal occult testing with negative results. No complications of anticoagulation noted today.  No dietary changes. She is very compliant with her Coumadin. She is not comfortable waiting 4 weeks for her next appointment and will be scheduled in 2 weeks.  If her INR remains stable, she would like to consider coming to the clinic monthly. Will continue Coumadin 5 mg daily, except 2.5 mg MWF. She is scheduled 02/22/13 for lab at 10:30am, CC at 10:45. Cletis Athens, PharmD

## 2013-02-19 ENCOUNTER — Other Ambulatory Visit: Payer: Self-pay | Admitting: Cardiology

## 2013-02-19 DIAGNOSIS — G4700 Insomnia, unspecified: Secondary | ICD-10-CM

## 2013-02-19 NOTE — Telephone Encounter (Signed)
Good morning Dr Patty Sermons. I just wanted to make sure this is ok to refill, and if so with how many refills. Thanks, MI

## 2013-02-22 ENCOUNTER — Ambulatory Visit (HOSPITAL_BASED_OUTPATIENT_CLINIC_OR_DEPARTMENT_OTHER): Payer: Medicare Other | Admitting: Pharmacist

## 2013-02-22 ENCOUNTER — Other Ambulatory Visit (HOSPITAL_BASED_OUTPATIENT_CLINIC_OR_DEPARTMENT_OTHER): Payer: Medicare Other | Admitting: Lab

## 2013-02-22 ENCOUNTER — Other Ambulatory Visit: Payer: Self-pay | Admitting: Cardiology

## 2013-02-22 DIAGNOSIS — D649 Anemia, unspecified: Secondary | ICD-10-CM

## 2013-02-22 DIAGNOSIS — D500 Iron deficiency anemia secondary to blood loss (chronic): Secondary | ICD-10-CM

## 2013-02-22 DIAGNOSIS — Z954 Presence of other heart-valve replacement: Secondary | ICD-10-CM

## 2013-02-22 DIAGNOSIS — D689 Coagulation defect, unspecified: Secondary | ICD-10-CM

## 2013-02-22 DIAGNOSIS — I4891 Unspecified atrial fibrillation: Secondary | ICD-10-CM

## 2013-02-22 DIAGNOSIS — Z7901 Long term (current) use of anticoagulants: Secondary | ICD-10-CM

## 2013-02-22 DIAGNOSIS — I359 Nonrheumatic aortic valve disorder, unspecified: Secondary | ICD-10-CM

## 2013-02-22 LAB — CBC & DIFF AND RETIC
BASO%: 0.3 % (ref 0.0–2.0)
Basophils Absolute: 0 10*3/uL (ref 0.0–0.1)
EOS%: 1 % (ref 0.0–7.0)
HCT: 40.9 % (ref 34.8–46.6)
LYMPH%: 20.5 % (ref 14.0–49.7)
MCH: 31 pg (ref 25.1–34.0)
MCHC: 32 g/dL (ref 31.5–36.0)
MCV: 96.9 fL (ref 79.5–101.0)
MONO%: 6.7 % (ref 0.0–14.0)
NEUT%: 71.5 % (ref 38.4–76.8)
RBC: 4.22 10*6/uL (ref 3.70–5.45)
Retic %: 2 % (ref 0.70–2.10)
lymph#: 0.8 10*3/uL — ABNORMAL LOW (ref 0.9–3.3)

## 2013-02-22 LAB — POCT INR: INR: 4.1

## 2013-02-22 LAB — PROTIME-INR

## 2013-02-22 LAB — FERRITIN CHCC: Ferritin: 138 ng/ml (ref 9–269)

## 2013-02-22 NOTE — Telephone Encounter (Signed)
Please refill warfarin 

## 2013-02-22 NOTE — Progress Notes (Signed)
INR above goal of 2.5-3.5.  Ms Deborah [name] states that she has not noticed any blood in her stool recently.  H/H=13.1/40.9  Ferritin still pending.  Ms Deborah [name] has been on coumadin since 1996.  She states she was stable on Coumadin 2.5mg  daily for a long time and has recently been on a variety of coumadin doses.  Will hold coumadin today, then resume at 2.5mg  T/Th/Sa/Su and 5mg  other days (previously on 5mg  T/Th/Sa/Su and 2.5mg  other days).  Will check PT/INR in 1 week.  It was a pleasure meeting Ms Deborah [name] today.

## 2013-02-26 ENCOUNTER — Other Ambulatory Visit: Payer: Self-pay | Admitting: Cardiology

## 2013-02-26 DIAGNOSIS — G4700 Insomnia, unspecified: Secondary | ICD-10-CM

## 2013-03-02 ENCOUNTER — Ambulatory Visit: Payer: Medicare Other

## 2013-03-02 ENCOUNTER — Other Ambulatory Visit: Payer: Medicare Other | Admitting: Lab

## 2013-03-03 ENCOUNTER — Encounter (INDEPENDENT_AMBULATORY_CARE_PROVIDER_SITE_OTHER): Payer: Self-pay

## 2013-03-03 ENCOUNTER — Ambulatory Visit (HOSPITAL_BASED_OUTPATIENT_CLINIC_OR_DEPARTMENT_OTHER): Payer: Medicare Other | Admitting: Pharmacist

## 2013-03-03 ENCOUNTER — Other Ambulatory Visit (HOSPITAL_BASED_OUTPATIENT_CLINIC_OR_DEPARTMENT_OTHER): Payer: Medicare Other | Admitting: Lab

## 2013-03-03 DIAGNOSIS — Z7901 Long term (current) use of anticoagulants: Secondary | ICD-10-CM

## 2013-03-03 DIAGNOSIS — D500 Iron deficiency anemia secondary to blood loss (chronic): Secondary | ICD-10-CM

## 2013-03-03 DIAGNOSIS — Z954 Presence of other heart-valve replacement: Secondary | ICD-10-CM

## 2013-03-03 DIAGNOSIS — D689 Coagulation defect, unspecified: Secondary | ICD-10-CM

## 2013-03-03 DIAGNOSIS — I359 Nonrheumatic aortic valve disorder, unspecified: Secondary | ICD-10-CM

## 2013-03-03 DIAGNOSIS — I4891 Unspecified atrial fibrillation: Secondary | ICD-10-CM

## 2013-03-03 LAB — CBC WITH DIFFERENTIAL/PLATELET
Basophils Absolute: 0 10*3/uL (ref 0.0–0.1)
EOS%: 1.1 % (ref 0.0–7.0)
Eosinophils Absolute: 0.1 10*3/uL (ref 0.0–0.5)
HCT: 41.1 % (ref 34.8–46.6)
HGB: 13 g/dL (ref 11.6–15.9)
MCH: 30.4 pg (ref 25.1–34.0)
MCV: 96.3 fL (ref 79.5–101.0)
MONO%: 7 % (ref 0.0–14.0)
NEUT%: 76.5 % (ref 38.4–76.8)
RDW: 17.7 % — ABNORMAL HIGH (ref 11.2–14.5)
lymph#: 0.8 10*3/uL — ABNORMAL LOW (ref 0.9–3.3)

## 2013-03-03 LAB — POCT INR: INR: 2.7

## 2013-03-03 LAB — PROTIME-INR: INR: 2.7 (ref 2.00–3.50)

## 2013-03-03 NOTE — Progress Notes (Signed)
INR at goal Pt is doing well with no complaints No issues with bleeding or bruising No missed or extra doses No diet or medication changes Pt states she likes to eat greens but tries to monitor her intake and stay consistent INR now trended back to goal after slight dose decrease last visit No changes Continue coumadin dose to 2.5mg  on Tuesday, Thursday, Sat. And Sun.   Take Coumadin 5mg  other days.   Recheck PT/INR on 03/16/13 at 1pm for lab and 1:15pm for coumadin clinic. appt with Dr. Patty Sermons at 10am this day as well

## 2013-03-03 NOTE — Patient Instructions (Signed)
INR at goal No changes Continue coumadin dose to 2.5mg  on Tuesday, Thursday, Sat. And Sun.   Take Coumadin 5mg  other days.   Recheck PT/INR on 03/16/13 at 1pm for lab and 1:15pm for coumadin clinic. appt with Dr. Patty Sermons at 10am this day as well

## 2013-03-09 ENCOUNTER — Other Ambulatory Visit: Payer: Self-pay | Admitting: Physician Assistant

## 2013-03-09 DIAGNOSIS — D500 Iron deficiency anemia secondary to blood loss (chronic): Secondary | ICD-10-CM

## 2013-03-09 DIAGNOSIS — D689 Coagulation defect, unspecified: Secondary | ICD-10-CM

## 2013-03-10 ENCOUNTER — Other Ambulatory Visit: Payer: Medicare Other

## 2013-03-16 ENCOUNTER — Encounter: Payer: Self-pay | Admitting: Cardiology

## 2013-03-16 ENCOUNTER — Ambulatory Visit: Payer: Self-pay | Admitting: Pharmacist

## 2013-03-16 ENCOUNTER — Other Ambulatory Visit (HOSPITAL_BASED_OUTPATIENT_CLINIC_OR_DEPARTMENT_OTHER): Payer: Medicare Other

## 2013-03-16 ENCOUNTER — Ambulatory Visit (INDEPENDENT_AMBULATORY_CARE_PROVIDER_SITE_OTHER): Payer: Medicare Other | Admitting: Cardiology

## 2013-03-16 VITALS — BP 134/60 | HR 73 | Ht 61.0 in | Wt 128.0 lb

## 2013-03-16 DIAGNOSIS — Z954 Presence of other heart-valve replacement: Secondary | ICD-10-CM

## 2013-03-16 DIAGNOSIS — D649 Anemia, unspecified: Secondary | ICD-10-CM

## 2013-03-16 DIAGNOSIS — I4891 Unspecified atrial fibrillation: Secondary | ICD-10-CM

## 2013-03-16 DIAGNOSIS — D500 Iron deficiency anemia secondary to blood loss (chronic): Secondary | ICD-10-CM

## 2013-03-16 DIAGNOSIS — D689 Coagulation defect, unspecified: Secondary | ICD-10-CM

## 2013-03-16 DIAGNOSIS — M1990 Unspecified osteoarthritis, unspecified site: Secondary | ICD-10-CM

## 2013-03-16 DIAGNOSIS — I119 Hypertensive heart disease without heart failure: Secondary | ICD-10-CM

## 2013-03-16 LAB — PROTIME-INR
INR: 2.7 (ref 2.00–3.50)
Protime: 32.4 Seconds — ABNORMAL HIGH (ref 10.6–13.4)

## 2013-03-16 LAB — CBC & DIFF AND RETIC
BASO%: 0.2 % (ref 0.0–2.0)
Basophils Absolute: 0 10*3/uL (ref 0.0–0.1)
EOS%: 0.9 % (ref 0.0–7.0)
HCT: 39.6 % (ref 34.8–46.6)
HGB: 12.9 g/dL (ref 11.6–15.9)
Immature Retic Fract: 6.7 % (ref 1.60–10.00)
LYMPH%: 16.1 % (ref 14.0–49.7)
MCH: 31.5 pg (ref 25.1–34.0)
MONO#: 0.3 10*3/uL (ref 0.1–0.9)
MONO%: 5.1 % (ref 0.0–14.0)
NEUT%: 77.7 % — ABNORMAL HIGH (ref 38.4–76.8)
Platelets: 168 10*3/uL (ref 145–400)
RDW: 17.2 % — ABNORMAL HIGH (ref 11.2–14.5)
Retic Ct Abs: 94.3 10*3/uL — ABNORMAL HIGH (ref 33.70–90.70)
WBC: 5.5 10*3/uL (ref 3.9–10.3)
lymph#: 0.9 10*3/uL (ref 0.9–3.3)

## 2013-03-16 LAB — POCT INR: INR: 2.7

## 2013-03-16 NOTE — Progress Notes (Signed)
Deborah [name] Date of Birth:  [birth year] [address] [place], [place]  [address] [phone]         Fax   [phone]  History of Present Illness: This pleasant 79 year old woman is seen back for a scheduled followup office visit. . She has a history of previous mechanical aortic valve replacement in 1996 and she has a history of chronic atrial fibrillation and is on Coumadin. She does not have any ischemic heart disease and she had a normal nuclear stress test in March 2012. She has not been experiencing any new cardiac symptoms. She does have a past history of high blood pressure. The patient has had significant GI problems and is followed closely by Dr. Ewing Schlein. She is on iron and her hemoglobin has been improving slowly and she has not been aware of any recent hematochezia.  Her stools are chronically dark. Since last visit her energy level has improved slightly and she has had no new cardiac symptoms.  She has a past history of iron deficiency anemia and is seeing Dr. Arlice Colt who plans to give her intravenous iron if her hemoglobin drops below 10.0.  She had a recent colonoscopy.  Her warfarin was held successfully for the colonoscopy for just 2 days before the procedure and one day after.  She did not have any thromboembolic sequelae.  Since last visit she has had no new cardiac symptoms.  No constitutional symptoms of fever or chills.  She is having some low back pain consistent with osteoarthritis of the lumbosacral spine.  The discomfort is worse after sitting and improves after she has been up stirring around.  Current Outpatient Prescriptions  Medication Sig Dispense Refill  . allopurinol (ZYLOPRIM) 300 MG tablet Take 300 mg by mouth daily.      . calcium-vitamin D 500 MG tablet Take 1 tablet by mouth 2 (two) times daily.        . celecoxib (CELEBREX) 200 MG capsule Take 200 mg by mouth daily.      Marland Kitchen dicyclomine (BENTYL) 10 MG capsule Take 10 mg by mouth as needed.  For stomach cramps      . digoxin (LANOXIN) 0.25 MG tablet Take 0.25 mg by mouth daily. In the morning      . fexofenadine (ALLEGRA ALLERGY) 180 MG tablet Take 180 mg by mouth daily. For allergies. As needed      . furosemide (LASIX) 40 MG tablet Take 40 mg by mouth daily as needed for fluid or edema.      . lansoprazole (PREVACID) 30 MG capsule Take 30 mg by mouth daily.        Marland Kitchen levothyroxine (SYNTHROID, LEVOTHROID) 88 MCG tablet Take 88 mcg by mouth daily before breakfast.      . metoprolol tartrate (LOPRESSOR) 25 MG tablet Take 12.5 mg by mouth 2 (two) times daily.      . multivitamin (THERAGRAN) per tablet Take 1 tablet by mouth daily.       . Naphazoline-Pheniramine (OPCON-A) 0.027-0.315 % SOLN Apply 1 drop to eye 2 (two) times daily as needed (itchy eyes).      . nitroGLYCERIN (NITROSTAT) 0.4 MG SL tablet Place 0.4 mg under the tongue every 5 (five) minutes as needed for chest pain.      Marland Kitchen NITROSTAT 0.4 MG SL tablet ONE TABLET UNDER TONGUE AS NEEDED  25 tablet  5  . Olmesartan-Amlodipine-HCTZ (TRIBENZOR) 40-5-12.5 MG TABS Take 1 tablet by mouth every morning.      Marland Kitchen  Omega-3 Fatty Acids (FISH OIL) 1000 MG CAPS Take 1,000 mg by mouth 2 (two) times daily.       . potassium chloride SA (K-DUR,KLOR-CON) 20 MEQ tablet Take 1 tablet (20 mEq total) by mouth daily.  30 tablet  1  . simvastatin (ZOCOR) 20 MG tablet Take 10 mg by mouth every evening.      . sodium chloride (OCEAN) 0.65 % nasal spray Place 1 spray into the nose 2 (two) times daily as needed for congestion.      Marya Landry 40-5-12.5 MG TABS TAKE ONE TABLET BY MOUTH ONCE DAILY  30 tablet  3  . warfarin (COUMADIN) 5 MG tablet Take 2.5-5 mg by mouth daily. Tuesday, Thursday, and Saturday is 5mg  all other days is 2.5 mg      . zolpidem (AMBIEN CR) 12.5 MG CR tablet TAKE ONE TABLET AT BEDTIME AS NEEDED  30 tablet  5  . furosemide (LASIX) 40 MG tablet TAKE ONE TABLET EACH DAY  30 tablet  2   No current facility-administered medications for  this visit.    Allergies  Allergen Reactions  . Amiodarone Shortness Of Breath  . Alendronate Sodium     GI upset  . Macrodantin [Nitrofurantoin]     GI upset  . Meclizine Swelling    tongue  . Norvasc [Amlodipine Besylate]     edema  . Tussionex Pennkinetic Er [Hydrocod Polst-Cpm Polst Er]     Patient Active Problem List   Diagnosis Date Noted  . S/P aortic valve replacement with metallic valve 07/10/2010    Priority: Medium  . Coagulopathy 01/27/2013  . Iron deficiency anemia secondary to blood loss (chronic) 07/24/2012  . Malaise and fatigue 05/21/2012  . Dyslipidemia 12/04/2011  . Atrial fibrillation 09/06/2011  . Encounter for long-term (current) use of anticoagulants 08/08/2011  . Nonsustained ventricular tachycardia 07/21/2011  . Hypokalemia 07/20/2011  . Hyponatremia 07/20/2011  . Bronchitis 07/20/2011  . Hypothyroidism   . Benign hypertensive heart disease without heart failure     History  Smoking status  . Former Smoker  . Quit date: 04/01/1988  Smokeless tobacco  . Not on file    History  Alcohol Use  . Yes    Comment: burbon or glass wine daily    Family History  Problem Relation Age of Onset  . Dementia Mother   . Coronary artery disease Father     Review of Systems: Constitutional: no fever chills diaphoresis or fatigue or change in weight.  Head and neck: no hearing loss, no epistaxis, no photophobia or visual disturbance. Respiratory: No cough, shortness of breath or wheezing. Cardiovascular: No chest pain peripheral edema, palpitations. Gastrointestinal: No abdominal distention, no abdominal pain, no change in bowel habits hematochezia or melena. Genitourinary: No dysuria, no frequency, no urgency, no nocturia. Musculoskeletal:No arthralgias, no back pain, no gait disturbance or myalgias. Neurological: No dizziness, no headaches, no numbness, no seizures, no syncope, no weakness, no tremors. Hematologic: No lymphadenopathy, no easy  bruising. Psychiatric: No confusion, no hallucinations, no sleep disturbance.    Physical Exam: Filed Vitals:   03/16/13 1027  BP: 134/60  Pulse: 73   the general appearance reveals a well-developed well-nourished woman in no distress.  She is looking forward to going to the beach tomorrow with 3 other women.The head and neck exam reveals pupils equal and reactive.  Extraocular movements are full.  There is no scleral icterus.  The mouth and pharynx are normal.  The neck is supple.  The  carotids reveal no bruits.  The jugular venous pressure is normal.  The  thyroid is not enlarged.  There is no lymphadenopathy.  The chest is clear to percussion and auscultation.  There are no rales or rhonchi.  Expansion of the chest is symmetrical.  The precordium is quiet.  The first heart sound is normal.  The aortic valve click is sharp.  There is no aortic insufficiency murmur  There is no  gallop rub or click.  There is no abnormal lift or heave.  The abdomen is soft and nontender.  The bowel sounds are normal.  The liver and spleen are not enlarged.  There are no abdominal masses.  There are no abdominal bruits.  Extremities reveal good pedal pulses.  There is no phlebitis or edema.  There is no cyanosis or clubbing.  Strength is normal and symmetrical in all extremities.  There is no lateralizing weakness.  There are no sensory deficits.  The skin is warm and dry.  There is no rash.     Assessment / Plan: Continue on same medications.  Continue Coumadin for mechanical prosthetic aortic valve.  Continue Celebrex for her symptomatic generalized osteoarthritis.  Return in 3 months for followup office visit.  She gets a hemoglobin checked each time she has her prothrombin time checked once a month.  She is getting her blood work drawn at Dr. [employer] office

## 2013-03-16 NOTE — Progress Notes (Signed)
INR = 2.7   Goal 2.5-3.5 INR within therapeutic range. No complications of anticoagulation noted. No dietary changes. No medication changes. She will continue Coumadin 2.5 mg Tues, Thurs, Sat, Sun and 5 mg Mon, Wed, Fri. She will return on 03/30/13 for lab at 10:00, Coumadin Clinic at 10:15.  Cletis Athens, PharmD

## 2013-03-16 NOTE — Assessment & Plan Note (Signed)
Blood pressure was remaining stable on current medication 

## 2013-03-16 NOTE — Assessment & Plan Note (Signed)
The patient denies any symptoms to suggest congestive heart failure.  Exercise tolerance is good.

## 2013-03-16 NOTE — Assessment & Plan Note (Signed)
Patient is in chronic established permanent atrial fibrillation.  She is on long-term Coumadin.  She has a mechanical aortic valve prosthesis and her INR goal is 2.5 up to 3.5.  She has not had any TIA or stroke symptoms.  She is not having any evidence of bleeding from the anticoagulation

## 2013-03-16 NOTE — Patient Instructions (Signed)
Your physician recommends that you continue on your current medications as directed. Please refer to the Current Medication list given to you today.  Your physician recommends that you schedule a follow-up appointment in: 3 month ov/ekg 

## 2013-03-17 ENCOUNTER — Telehealth: Payer: Self-pay | Admitting: *Deleted

## 2013-03-17 NOTE — Telephone Encounter (Signed)
Called to discuss labs with patient, Ferritin level is decreasing and will be needing to get some iron in the near future. Patient asked to come in when she has her next Coumadin Clinic on 12/30. Will order and call patient with appts.

## 2013-03-19 ENCOUNTER — Encounter (INDEPENDENT_AMBULATORY_CARE_PROVIDER_SITE_OTHER): Payer: Self-pay

## 2013-03-19 ENCOUNTER — Telehealth: Payer: Self-pay | Admitting: *Deleted

## 2013-03-19 ENCOUNTER — Ambulatory Visit (HOSPITAL_BASED_OUTPATIENT_CLINIC_OR_DEPARTMENT_OTHER): Payer: Medicare Other

## 2013-03-19 VITALS — BP 162/78 | HR 77 | Temp 98.7°F | Resp 16

## 2013-03-19 DIAGNOSIS — R5381 Other malaise: Secondary | ICD-10-CM

## 2013-03-19 DIAGNOSIS — D509 Iron deficiency anemia, unspecified: Secondary | ICD-10-CM

## 2013-03-19 MED ORDER — FERUMOXYTOL INJECTION 510 MG/17 ML
1020.0000 mg | Freq: Once | INTRAVENOUS | Status: AC
  Administered 2013-03-19: 1020 mg via INTRAVENOUS
  Filled 2013-03-19: qty 34

## 2013-03-19 NOTE — Telephone Encounter (Signed)
OK for patient to come in today at 3pm for IV Iron. Notified patient and left message with Sylvan Cheese, daughter.

## 2013-03-19 NOTE — Telephone Encounter (Signed)
Patient/daughter would like to see if Ms Funderburg can come in before the 30th to get IV Feraheme, it would decrease her worry during the holidays. Called to chemo charge nurse and left message to see about having this arranged for today. Corrie Dandy states she could bring patient this afternoon "2-3ish".

## 2013-03-19 NOTE — Telephone Encounter (Signed)
sw pt gv appts for 03/29/13 w/labs @ 10am, cou@ 10:15am, and made pt aware that she will have tx on this day as well. i emailed MW to add the tx...td

## 2013-03-19 NOTE — Telephone Encounter (Signed)
Per staff message and POF I have scheduled appts.  JMW  

## 2013-03-19 NOTE — Patient Instructions (Signed)

## 2013-03-30 ENCOUNTER — Ambulatory Visit (HOSPITAL_BASED_OUTPATIENT_CLINIC_OR_DEPARTMENT_OTHER): Payer: Medicare Other | Admitting: Pharmacist

## 2013-03-30 ENCOUNTER — Other Ambulatory Visit (HOSPITAL_BASED_OUTPATIENT_CLINIC_OR_DEPARTMENT_OTHER): Payer: Medicare Other

## 2013-03-30 ENCOUNTER — Other Ambulatory Visit: Payer: Self-pay | Admitting: *Deleted

## 2013-03-30 ENCOUNTER — Ambulatory Visit: Payer: Medicare Other

## 2013-03-30 DIAGNOSIS — I4891 Unspecified atrial fibrillation: Secondary | ICD-10-CM

## 2013-03-30 DIAGNOSIS — I359 Nonrheumatic aortic valve disorder, unspecified: Secondary | ICD-10-CM

## 2013-03-30 DIAGNOSIS — Z7901 Long term (current) use of anticoagulants: Secondary | ICD-10-CM

## 2013-03-30 DIAGNOSIS — D689 Coagulation defect, unspecified: Secondary | ICD-10-CM

## 2013-03-30 DIAGNOSIS — Z954 Presence of other heart-valve replacement: Secondary | ICD-10-CM

## 2013-03-30 LAB — CBC WITH DIFFERENTIAL/PLATELET
BASO%: 0.2 % (ref 0.0–2.0)
Basophils Absolute: 0 10*3/uL (ref 0.0–0.1)
EOS%: 1.2 % (ref 0.0–7.0)
Eosinophils Absolute: 0.1 10*3/uL (ref 0.0–0.5)
HCT: 39.3 % (ref 34.8–46.6)
HGB: 12.8 g/dL (ref 11.6–15.9)
LYMPH%: 16.1 % (ref 14.0–49.7)
MCH: 31.6 pg (ref 25.1–34.0)
MCHC: 32.6 g/dL (ref 31.5–36.0)
MCV: 97 fL (ref 79.5–101.0)
MONO%: 7.7 % (ref 0.0–14.0)
NEUT%: 74.8 % (ref 38.4–76.8)
WBC: 5 10*3/uL (ref 3.9–10.3)
lymph#: 0.8 10*3/uL — ABNORMAL LOW (ref 0.9–3.3)

## 2013-03-30 LAB — PROTIME-INR: INR: 2.3 (ref 2.00–3.50)

## 2013-03-30 NOTE — Progress Notes (Signed)
INR = 2.3 on 2.5 mg daily except 5 mg MWF Pt is doing well today.  CBC wnl. She has eaten 1-2 more "side" salads recently.  She would like to eat more greens overall.  She'd particularly like to have a "side" salad QOD & occasionally (here at [facility] for sure) have collards or some other green vegetable (green beans or peas). Given her INR is low today & her desire to increase her vit K intake slightly, I will adjust her Coumadin dose to 5 mg/day this week except 2.5 mg on Friday. Next week she'll begin taking 5 mg daily except 2.5 mg MWF. Return in 2 weeks. Ebony Hail, Pharm.D., CPP 03/30/2013@10 :46 AM

## 2013-03-31 ENCOUNTER — Telehealth: Payer: Self-pay | Admitting: Oncology

## 2013-03-31 NOTE — Telephone Encounter (Signed)
, °

## 2013-04-02 ENCOUNTER — Telehealth: Payer: Self-pay | Admitting: Oncology

## 2013-04-05 ENCOUNTER — Telehealth: Payer: Self-pay | Admitting: Cardiology

## 2013-04-05 ENCOUNTER — Other Ambulatory Visit: Payer: Self-pay | Admitting: Oncology

## 2013-04-05 DIAGNOSIS — M545 Low back pain, unspecified: Secondary | ICD-10-CM

## 2013-04-05 NOTE — Telephone Encounter (Signed)
New Problem:  Pt states the last time she was seen Dr. Mare Ferrari had mentioned to her about having a urinalysis. Pt does not have an order for a UA... Pt is requesting a call back from [facility]. Pt is also asking about her Echo appt in March. Pt has questions.Marland KitchenMarland Kitchen

## 2013-04-05 NOTE — Telephone Encounter (Signed)
Left message to call back  

## 2013-04-06 NOTE — Telephone Encounter (Signed)
Patient states she had discussed urinalysis with  Dr. Mare Ferrari previously and lower back pain no better. Scheduled appointment for U/A for tomorrow. Also patient was scheduled for an echo in March and no follow up ov with  Dr. Mare Ferrari. Patient not sure why an echo was scheduled stating that  Dr. Mare Ferrari did not mention at last ov. Reviewed last ov, no mention of follow up echo at this time. Cancelled echo and scheduled follow up ov

## 2013-04-06 NOTE — Telephone Encounter (Signed)
Follow up ° ° ° ° ° ° ° ° ° °Pt returning nurses call °

## 2013-04-07 ENCOUNTER — Other Ambulatory Visit: Payer: Medicare Other

## 2013-04-07 ENCOUNTER — Other Ambulatory Visit (INDEPENDENT_AMBULATORY_CARE_PROVIDER_SITE_OTHER): Payer: Medicare Other

## 2013-04-07 DIAGNOSIS — M545 Low back pain, unspecified: Secondary | ICD-10-CM

## 2013-04-07 LAB — URINALYSIS
Bilirubin Urine: NEGATIVE
Hgb urine dipstick: NEGATIVE
Ketones, ur: NEGATIVE
LEUKOCYTES UA: NEGATIVE
NITRITE: NEGATIVE
SPECIFIC GRAVITY, URINE: 1.015 (ref 1.000–1.030)
Total Protein, Urine: NEGATIVE
Urine Glucose: NEGATIVE
Urobilinogen, UA: 0.2 (ref 0.0–1.0)
pH: 6.5 (ref 5.0–8.0)

## 2013-04-09 ENCOUNTER — Telehealth: Payer: Self-pay | Admitting: Cardiology

## 2013-04-09 NOTE — Telephone Encounter (Signed)
Advised patient

## 2013-04-09 NOTE — Telephone Encounter (Signed)
New message ° ° ° ° °Want test results °

## 2013-04-09 NOTE — Telephone Encounter (Signed)
Message copied by Earvin Hansen on Fri Apr 09, 2013  5:15 PM ------      Message from: Darlin Coco      Created: Wed Apr 07, 2013  6:00 PM       Please report.  The urinalysis is normal.  No evidence of infection. ------

## 2013-04-12 ENCOUNTER — Other Ambulatory Visit: Payer: Self-pay | Admitting: Cardiology

## 2013-04-14 ENCOUNTER — Ambulatory Visit (HOSPITAL_BASED_OUTPATIENT_CLINIC_OR_DEPARTMENT_OTHER): Payer: Medicare Other | Admitting: Pharmacist

## 2013-04-14 ENCOUNTER — Other Ambulatory Visit (HOSPITAL_BASED_OUTPATIENT_CLINIC_OR_DEPARTMENT_OTHER): Payer: Medicare Other

## 2013-04-14 DIAGNOSIS — Z7901 Long term (current) use of anticoagulants: Secondary | ICD-10-CM

## 2013-04-14 DIAGNOSIS — D689 Coagulation defect, unspecified: Secondary | ICD-10-CM

## 2013-04-14 DIAGNOSIS — I359 Nonrheumatic aortic valve disorder, unspecified: Secondary | ICD-10-CM

## 2013-04-14 DIAGNOSIS — I4891 Unspecified atrial fibrillation: Secondary | ICD-10-CM

## 2013-04-14 DIAGNOSIS — D649 Anemia, unspecified: Secondary | ICD-10-CM

## 2013-04-14 DIAGNOSIS — Z954 Presence of other heart-valve replacement: Secondary | ICD-10-CM

## 2013-04-14 LAB — CBC & DIFF AND RETIC
BASO%: 0.2 % (ref 0.0–2.0)
BASOS ABS: 0 10*3/uL (ref 0.0–0.1)
EOS%: 1.6 % (ref 0.0–7.0)
Eosinophils Absolute: 0.1 10*3/uL (ref 0.0–0.5)
HCT: 39.5 % (ref 34.8–46.6)
HEMOGLOBIN: 12.9 g/dL (ref 11.6–15.9)
Immature Retic Fract: 6.9 % (ref 1.60–10.00)
LYMPH%: 14.2 % (ref 14.0–49.7)
MCH: 32.5 pg (ref 25.1–34.0)
MCHC: 32.7 g/dL (ref 31.5–36.0)
MCV: 99.5 fL (ref 79.5–101.0)
MONO#: 0.3 10*3/uL (ref 0.1–0.9)
MONO%: 6.5 % (ref 0.0–14.0)
NEUT#: 3.5 10*3/uL (ref 1.5–6.5)
NEUT%: 77.5 % — ABNORMAL HIGH (ref 38.4–76.8)
Platelets: 160 10*3/uL (ref 145–400)
RBC: 3.97 10*6/uL (ref 3.70–5.45)
RDW: 16.3 % — AB (ref 11.2–14.5)
RETIC CT ABS: 103.22 10*3/uL — AB (ref 33.70–90.70)
Retic %: 2.6 % — ABNORMAL HIGH (ref 0.70–2.10)
WBC: 4.5 10*3/uL (ref 3.9–10.3)
lymph#: 0.6 10*3/uL — ABNORMAL LOW (ref 0.9–3.3)

## 2013-04-14 LAB — FERRITIN CHCC: Ferritin: 420 ng/ml — ABNORMAL HIGH (ref 9–269)

## 2013-04-14 LAB — PROTIME-INR
INR: 2.2 (ref 2.00–3.50)
PROTIME: 26.4 s — AB (ref 10.6–13.4)

## 2013-04-14 LAB — POCT INR: INR: 2.2

## 2013-04-14 NOTE — Patient Instructions (Signed)
INR just below goal Begin new dose: 5 mg daily except Mondays and Fridays Recheck PT/INR on 04/28/13 at 10:00am for lab and 10:15am for coumadin clinic.

## 2013-04-14 NOTE — Progress Notes (Signed)
INR just below goal Patient is doing well today with no complaints No issues regarding anticoagulation (no bruising or bleeding) No missed or extra doses No changes to her medications Patient has been and would like to continue to have a "side" salad and occasionally  have collards or some other green vegetable (green beans or peas).  Given her INR is low today & her desire to increase her vit K intake slightly will again adjust her dose Plan:  Begin new dose: 5 mg daily except Mondays and Fridays Recheck PT/INR on 04/28/13 at 10:00am for lab and 10:15am for coumadin clinic.

## 2013-04-19 ENCOUNTER — Other Ambulatory Visit: Payer: Self-pay | Admitting: Cardiology

## 2013-04-28 ENCOUNTER — Ambulatory Visit (HOSPITAL_BASED_OUTPATIENT_CLINIC_OR_DEPARTMENT_OTHER): Payer: Medicare Other | Admitting: Pharmacist

## 2013-04-28 ENCOUNTER — Other Ambulatory Visit (HOSPITAL_BASED_OUTPATIENT_CLINIC_OR_DEPARTMENT_OTHER): Payer: Medicare Other

## 2013-04-28 DIAGNOSIS — Z954 Presence of other heart-valve replacement: Secondary | ICD-10-CM

## 2013-04-28 DIAGNOSIS — I359 Nonrheumatic aortic valve disorder, unspecified: Secondary | ICD-10-CM

## 2013-04-28 DIAGNOSIS — Z7901 Long term (current) use of anticoagulants: Secondary | ICD-10-CM

## 2013-04-28 DIAGNOSIS — D689 Coagulation defect, unspecified: Secondary | ICD-10-CM

## 2013-04-28 DIAGNOSIS — I4891 Unspecified atrial fibrillation: Secondary | ICD-10-CM

## 2013-04-28 LAB — POCT INR: INR: 3

## 2013-04-28 LAB — PROTIME-INR
INR: 3 (ref 2.00–3.50)
PROTIME: 36 s — AB (ref 10.6–13.4)

## 2013-04-28 LAB — CBC WITH DIFFERENTIAL/PLATELET
BASO%: 0.2 % (ref 0.0–2.0)
BASOS ABS: 0 10*3/uL (ref 0.0–0.1)
EOS%: 1.3 % (ref 0.0–7.0)
Eosinophils Absolute: 0.1 10*3/uL (ref 0.0–0.5)
HCT: 36.6 % (ref 34.8–46.6)
HGB: 12.1 g/dL (ref 11.6–15.9)
LYMPH%: 15.8 % (ref 14.0–49.7)
MCH: 32.8 pg (ref 25.1–34.0)
MCHC: 33.1 g/dL (ref 31.5–36.0)
MCV: 99.2 fL (ref 79.5–101.0)
MONO#: 0.3 10*3/uL (ref 0.1–0.9)
MONO%: 6.5 % (ref 0.0–14.0)
NEUT#: 3.6 10*3/uL (ref 1.5–6.5)
NEUT%: 76.2 % (ref 38.4–76.8)
PLATELETS: 162 10*3/uL (ref 145–400)
RBC: 3.69 10*6/uL — AB (ref 3.70–5.45)
RDW: 15.5 % — ABNORMAL HIGH (ref 11.2–14.5)
WBC: 4.8 10*3/uL (ref 3.9–10.3)
lymph#: 0.8 10*3/uL — ABNORMAL LOW (ref 0.9–3.3)

## 2013-04-28 NOTE — Progress Notes (Signed)
INR within goal today. No medication changes. No missed doses. Pt has noticed that her stools have been much darker for about 3 days. Her stools were not as blackish today.   She stated this comes and goes for her and has happened in the past. She declined doing stool cards today. She will call us if the trend continues. She will monitor. Hemoglobin results for the past few weeks are stable, range 12-13. No concerns regarding anticoagulation.  Pt has enjoyed more greens (spinach and collard greens) over the last few weeks and will continue. Continue 5 mg daily except 2.5mg  on Mondays and Fridays. Recheck PT/INR on 05/05/13 at 10:00am for lab and 10:15am for coumadin clinic.

## 2013-04-28 NOTE — Patient Instructions (Signed)
Continue 5 mg daily except 2.5mg  on Mondays and Fridays. Recheck PT/INR on 05/05/13 at 10:00am for lab and 10:15am for coumadin clinic.

## 2013-05-04 ENCOUNTER — Other Ambulatory Visit: Payer: Self-pay | Admitting: *Deleted

## 2013-05-04 DIAGNOSIS — D500 Iron deficiency anemia secondary to blood loss (chronic): Secondary | ICD-10-CM

## 2013-05-05 ENCOUNTER — Telehealth: Payer: Self-pay | Admitting: *Deleted

## 2013-05-05 ENCOUNTER — Other Ambulatory Visit: Payer: Self-pay | Admitting: *Deleted

## 2013-05-05 ENCOUNTER — Other Ambulatory Visit (HOSPITAL_BASED_OUTPATIENT_CLINIC_OR_DEPARTMENT_OTHER): Payer: Medicare Other

## 2013-05-05 ENCOUNTER — Ambulatory Visit (HOSPITAL_BASED_OUTPATIENT_CLINIC_OR_DEPARTMENT_OTHER): Payer: Medicare Other | Admitting: Pharmacist

## 2013-05-05 DIAGNOSIS — I4891 Unspecified atrial fibrillation: Secondary | ICD-10-CM

## 2013-05-05 DIAGNOSIS — Z7901 Long term (current) use of anticoagulants: Secondary | ICD-10-CM

## 2013-05-05 DIAGNOSIS — D689 Coagulation defect, unspecified: Secondary | ICD-10-CM

## 2013-05-05 DIAGNOSIS — Z954 Presence of other heart-valve replacement: Secondary | ICD-10-CM

## 2013-05-05 DIAGNOSIS — R5383 Other fatigue: Secondary | ICD-10-CM

## 2013-05-05 DIAGNOSIS — R5381 Other malaise: Secondary | ICD-10-CM

## 2013-05-05 DIAGNOSIS — D649 Anemia, unspecified: Secondary | ICD-10-CM

## 2013-05-05 DIAGNOSIS — I359 Nonrheumatic aortic valve disorder, unspecified: Secondary | ICD-10-CM

## 2013-05-05 DIAGNOSIS — D500 Iron deficiency anemia secondary to blood loss (chronic): Secondary | ICD-10-CM

## 2013-05-05 LAB — CBC & DIFF AND RETIC
BASO%: 0.5 % (ref 0.0–2.0)
Basophils Absolute: 0 10*3/uL (ref 0.0–0.1)
EOS ABS: 0.1 10*3/uL (ref 0.0–0.5)
EOS%: 1.4 % (ref 0.0–7.0)
HEMATOCRIT: 37.5 % (ref 34.8–46.6)
HGB: 12.5 g/dL (ref 11.6–15.9)
LYMPH%: 14.1 % (ref 14.0–49.7)
MCH: 33.6 pg (ref 25.1–34.0)
MCHC: 33.3 g/dL (ref 31.5–36.0)
MCV: 101 fL (ref 79.5–101.0)
MONO#: 0.3 10*3/uL (ref 0.1–0.9)
MONO%: 6.8 % (ref 0.0–14.0)
NEUT%: 77.2 % — AB (ref 38.4–76.8)
NEUTROS ABS: 3.5 10*3/uL (ref 1.5–6.5)
PLATELETS: 164 10*3/uL (ref 145–400)
RBC: 3.71 10*6/uL (ref 3.70–5.45)
RDW: 15.5 % — ABNORMAL HIGH (ref 11.2–14.5)
WBC: 4.5 10*3/uL (ref 3.9–10.3)
lymph#: 0.6 10*3/uL — ABNORMAL LOW (ref 0.9–3.3)

## 2013-05-05 LAB — POCT INR: INR: 3.4

## 2013-05-05 LAB — PROTIME-INR
INR: 3.4 (ref 2.00–3.50)
Protime: 40.8 Seconds — ABNORMAL HIGH (ref 10.6–13.4)

## 2013-05-05 LAB — FERRITIN CHCC: Ferritin: 171 ng/ml (ref 9–269)

## 2013-05-05 LAB — RETICULOCYTES (CHCC)
ABS RETIC: 112.2 10*3/uL (ref 19.0–186.0)
RBC.: 3.74 MIL/uL — ABNORMAL LOW (ref 3.87–5.11)
Retic Ct Pct: 3 % — ABNORMAL HIGH (ref 0.4–2.3)

## 2013-05-05 NOTE — Telephone Encounter (Signed)
Daughter [name] in office today, shared lab results on patient per Dr Jana Hakim, looks good,  Ferritin=171, no action unless <100. We will recheck labs with each coumadin clinic visit.

## 2013-05-05 NOTE — Progress Notes (Signed)
INR = 3.4 on Coumadin 5 mg/day; 2.5 mg on Mon/Fri. Hgb = 12.5; Hct = 37.5 Pt c/o being tired & SOB Denies cough, dizziness or blurred vision. She has not had as many "greens" this week as she has been eating.  She continues to incorporate them in her diet though. INR at goal.  No change to Coumadin dose. Return to Coumadin clinic in 1 month. Pt awaiting Ferritin result.  She feels like she is due for some Iron. Kennith Center, Pharm.D., CPP 05/05/2013@10 :44 AM

## 2013-05-06 ENCOUNTER — Other Ambulatory Visit: Payer: Self-pay | Admitting: Cardiology

## 2013-05-11 ENCOUNTER — Other Ambulatory Visit: Payer: Self-pay | Admitting: Cardiology

## 2013-05-17 ENCOUNTER — Ambulatory Visit (HOSPITAL_BASED_OUTPATIENT_CLINIC_OR_DEPARTMENT_OTHER): Payer: Medicare Other | Admitting: Pharmacist

## 2013-05-17 ENCOUNTER — Ambulatory Visit (HOSPITAL_BASED_OUTPATIENT_CLINIC_OR_DEPARTMENT_OTHER): Payer: Medicare Other

## 2013-05-17 ENCOUNTER — Other Ambulatory Visit: Payer: Self-pay | Admitting: Physician Assistant

## 2013-05-17 ENCOUNTER — Telehealth: Payer: Self-pay | Admitting: *Deleted

## 2013-05-17 ENCOUNTER — Telehealth: Payer: Self-pay | Admitting: Oncology

## 2013-05-17 DIAGNOSIS — Z7901 Long term (current) use of anticoagulants: Secondary | ICD-10-CM

## 2013-05-17 DIAGNOSIS — Z954 Presence of other heart-valve replacement: Secondary | ICD-10-CM

## 2013-05-17 DIAGNOSIS — I4891 Unspecified atrial fibrillation: Secondary | ICD-10-CM

## 2013-05-17 DIAGNOSIS — D689 Coagulation defect, unspecified: Secondary | ICD-10-CM

## 2013-05-17 DIAGNOSIS — I359 Nonrheumatic aortic valve disorder, unspecified: Secondary | ICD-10-CM

## 2013-05-17 DIAGNOSIS — D500 Iron deficiency anemia secondary to blood loss (chronic): Secondary | ICD-10-CM

## 2013-05-17 LAB — CBC WITH DIFFERENTIAL/PLATELET
BASO%: 0.7 % (ref 0.0–2.0)
BASOS ABS: 0 10*3/uL (ref 0.0–0.1)
EOS ABS: 0.1 10*3/uL (ref 0.0–0.5)
EOS%: 1.1 % (ref 0.0–7.0)
HCT: 31.8 % — ABNORMAL LOW (ref 34.8–46.6)
HEMOGLOBIN: 10.2 g/dL — AB (ref 11.6–15.9)
LYMPH%: 19.3 % (ref 14.0–49.7)
MCH: 32.4 pg (ref 25.1–34.0)
MCHC: 32.1 g/dL (ref 31.5–36.0)
MCV: 101 fL (ref 79.5–101.0)
MONO#: 0.4 10*3/uL (ref 0.1–0.9)
MONO%: 8.3 % (ref 0.0–14.0)
NEUT#: 3.2 10*3/uL (ref 1.5–6.5)
NEUT%: 70.6 % (ref 38.4–76.8)
PLATELETS: 162 10*3/uL (ref 145–400)
RBC: 3.15 10*6/uL — AB (ref 3.70–5.45)
RDW: 15.4 % — ABNORMAL HIGH (ref 11.2–14.5)
WBC: 4.6 10*3/uL (ref 3.9–10.3)
lymph#: 0.9 10*3/uL (ref 0.9–3.3)
nRBC: 0 % (ref 0–0)

## 2013-05-17 LAB — PROTIME-INR
INR: 3 (ref 2.00–3.50)
Protime: 36 Seconds — ABNORMAL HIGH (ref 10.6–13.4)

## 2013-05-17 LAB — POCT INR: INR: 3

## 2013-05-17 NOTE — Progress Notes (Signed)
INR = 3 today on Coumadin 5 mg/day except 2.5 mg on Mon/Fri. Pt came in as unscheduled visit today because she has had dark tarry stools since 05/07/13 & she is concerned she is "getting too much Coumadin." Hgb = 10.2 g/dL; Hct = 31.8 No other bleeding elsewhere.  No bright red blood seen. She saw Dr. Watt Climes (GI) on 05/06/13 & was not symptomatic at the time.  Her sxs began the next day and have continued.  She states she has had every test known to evaluate this chronic problem & the tests have all been negative. In Dr. Virgie Dad absence today, I s/w Micah Flesher, PA over the phone.  She would like pt to return to Dr. Perley Jain office ASAP for evaluation to r/o GI Bleed. Pt refuses to go back to Dr. Perley Jain office.  She thinks it is not necessary since she was just there & has undergone extensive testing in the past.  Pt states Dr. Jana Hakim usually orders her an iron infusion when her Hgb < 10 g/dL & she'd prefer to come back next week for a repeat CBC instead. I will arrange for her to have CBC on 05/24/13.  She will not stay after that lab appt.  She knows she will not come to the Coumadin clinic that day.  Dr. Virgie Dad staff will need to contact pt w/ her results. As far as her Coumadin is concerned, her INR is at goal.  No change to her dose is necessary in spite of the drop in Hgb & hematochezia.  She requires full dose anticoagulation due to valve replacement. I have instructed pt to hold her Celebrex until at least next weeks CBC is performed.  She agrees, reluctantly (states she will have a lot of pain).  She will try APAP in the interim if she develops pain. We'll stay on track to see her 06/02/13 as planned at last Coumadin clinic visit.  Kennith Center, Pharm.D., CPP 05/17/2013@12 :18 PM

## 2013-05-17 NOTE — Telephone Encounter (Signed)
S/w the pt and she is aware of her lab appt on 05/24/2013. S/w amy berry who is aware that the pt wants to wait for this lab draw before she decides if she wants to see dr Watt Climes again.

## 2013-05-17 NOTE — Telephone Encounter (Signed)
S/w the pt who stated that she does not wish to see dr Watt Climes since she just saw him on 05/06/2013. Pt stated she just would like another lab draw prior.

## 2013-05-17 NOTE — Telephone Encounter (Signed)
SHE HAD A PROTIME CHECKED ON 05/05/13. HER INR WAS 3.4. PT. IS TAKING COUMADIN 5MG  EXCEPT ON Monday AND Friday SHE TAKES 2.5MG . SPOKE TO THE PHARMACIST, GINNA TUCKER, IN THE COUMADIN CLINIC. GINNA WILL CONTACT PT.

## 2013-05-19 ENCOUNTER — Encounter (HOSPITAL_COMMUNITY): Payer: Self-pay | Admitting: Emergency Medicine

## 2013-05-19 ENCOUNTER — Inpatient Hospital Stay (HOSPITAL_COMMUNITY)
Admission: EM | Admit: 2013-05-19 | Discharge: 2013-05-22 | DRG: 379 | Disposition: A | Discharge status: Home / Self Care | Payer: Medicare Other | Attending: Internal Medicine | Admitting: Internal Medicine

## 2013-05-19 DIAGNOSIS — I10 Essential (primary) hypertension: Secondary | ICD-10-CM | POA: Diagnosis present

## 2013-05-19 DIAGNOSIS — I472 Ventricular tachycardia: Secondary | ICD-10-CM

## 2013-05-19 DIAGNOSIS — K5521 Angiodysplasia of colon with hemorrhage: Principal | ICD-10-CM | POA: Diagnosis present

## 2013-05-19 DIAGNOSIS — Z79899 Other long term (current) drug therapy: Secondary | ICD-10-CM

## 2013-05-19 DIAGNOSIS — I4891 Unspecified atrial fibrillation: Secondary | ICD-10-CM

## 2013-05-19 DIAGNOSIS — J40 Bronchitis, not specified as acute or chronic: Secondary | ICD-10-CM

## 2013-05-19 DIAGNOSIS — R5381 Other malaise: Secondary | ICD-10-CM

## 2013-05-19 DIAGNOSIS — I119 Hypertensive heart disease without heart failure: Secondary | ICD-10-CM

## 2013-05-19 DIAGNOSIS — R5383 Other fatigue: Secondary | ICD-10-CM

## 2013-05-19 DIAGNOSIS — K219 Gastro-esophageal reflux disease without esophagitis: Secondary | ICD-10-CM | POA: Diagnosis present

## 2013-05-19 DIAGNOSIS — I4729 Other ventricular tachycardia: Secondary | ICD-10-CM

## 2013-05-19 DIAGNOSIS — Z7901 Long term (current) use of anticoagulants: Secondary | ICD-10-CM

## 2013-05-19 DIAGNOSIS — K921 Melena: Secondary | ICD-10-CM | POA: Diagnosis present

## 2013-05-19 DIAGNOSIS — D500 Iron deficiency anemia secondary to blood loss (chronic): Secondary | ICD-10-CM | POA: Diagnosis present

## 2013-05-19 DIAGNOSIS — E876 Hypokalemia: Secondary | ICD-10-CM

## 2013-05-19 DIAGNOSIS — D689 Coagulation defect, unspecified: Secondary | ICD-10-CM

## 2013-05-19 DIAGNOSIS — E871 Hypo-osmolality and hyponatremia: Secondary | ICD-10-CM

## 2013-05-19 DIAGNOSIS — Z791 Long term (current) use of non-steroidal anti-inflammatories (NSAID): Secondary | ICD-10-CM

## 2013-05-19 DIAGNOSIS — Z853 Personal history of malignant neoplasm of breast: Secondary | ICD-10-CM

## 2013-05-19 DIAGNOSIS — E785 Hyperlipidemia, unspecified: Secondary | ICD-10-CM | POA: Diagnosis present

## 2013-05-19 DIAGNOSIS — Z954 Presence of other heart-valve replacement: Secondary | ICD-10-CM

## 2013-05-19 DIAGNOSIS — Z87891 Personal history of nicotine dependence: Secondary | ICD-10-CM

## 2013-05-19 DIAGNOSIS — I482 Chronic atrial fibrillation, unspecified: Secondary | ICD-10-CM | POA: Diagnosis present

## 2013-05-19 DIAGNOSIS — E039 Hypothyroidism, unspecified: Secondary | ICD-10-CM | POA: Diagnosis present

## 2013-05-19 HISTORY — DX: Cardiac murmur, unspecified: R01.1

## 2013-05-19 HISTORY — DX: Malignant neoplasm of unspecified site of unspecified female breast: C50.919

## 2013-05-19 HISTORY — DX: Personal history of other medical treatment: Z92.89

## 2013-05-19 HISTORY — DX: Pure hypercholesterolemia, unspecified: E78.00

## 2013-05-19 HISTORY — DX: Gastrointestinal hemorrhage, unspecified: K92.2

## 2013-05-19 HISTORY — DX: Gout, unspecified: M10.9

## 2013-05-19 LAB — COMPREHENSIVE METABOLIC PANEL
ALBUMIN: 3.8 g/dL (ref 3.5–5.2)
ALT: 26 U/L (ref 0–35)
AST: 34 U/L (ref 0–37)
Alkaline Phosphatase: 57 U/L (ref 39–117)
BUN: 22 mg/dL (ref 6–23)
CO2: 26 mEq/L (ref 19–32)
CREATININE: 0.71 mg/dL (ref 0.50–1.10)
Calcium: 9.9 mg/dL (ref 8.4–10.5)
Chloride: 96 mEq/L (ref 96–112)
GFR calc Af Amer: 89 mL/min — ABNORMAL LOW (ref >90)
GFR calc non Af Amer: 77 mL/min — ABNORMAL LOW (ref >90)
Glucose, Bld: 96 mg/dL (ref 70–99)
POTASSIUM: 4.3 meq/L (ref 3.7–5.3)
Sodium: 137 mEq/L (ref 137–147)
Total Bilirubin: 0.3 mg/dL (ref 0.3–1.2)
Total Protein: 6.8 g/dL (ref 6.0–8.3)

## 2013-05-19 LAB — CBC
HEMATOCRIT: 32.4 % — AB (ref 36.0–46.0)
HEMOGLOBIN: 10.5 g/dL — AB (ref 12.0–15.0)
MCH: 33.3 pg (ref 26.0–34.0)
MCHC: 32.4 g/dL (ref 30.0–36.0)
MCV: 102.9 fL — ABNORMAL HIGH (ref 78.0–100.0)
Platelets: 157 10*3/uL (ref 150–400)
RBC: 3.15 MIL/uL — ABNORMAL LOW (ref 3.87–5.11)
RDW: 15.5 % (ref 11.5–15.5)
WBC: 4.2 10*3/uL (ref 4.0–10.5)

## 2013-05-19 LAB — PROTIME-INR
INR: 1.9 — AB (ref 0.00–1.49)
PROTHROMBIN TIME: 21.2 s — AB (ref 11.6–15.2)

## 2013-05-19 MED ORDER — IRBESARTAN 300 MG PO TABS
300.0000 mg | ORAL_TABLET | Freq: Every day | ORAL | Status: DC
  Administered 2013-05-20 – 2013-05-22 (×3): 300 mg via ORAL
  Filled 2013-05-19 (×3): qty 1

## 2013-05-19 MED ORDER — PREDNISOLONE ACETATE 1 % OP SUSP
1.0000 [drp] | Freq: Four times a day (QID) | OPHTHALMIC | Status: DC
  Administered 2013-05-22: 1 [drp] via OPHTHALMIC
  Filled 2013-05-19: qty 1

## 2013-05-19 MED ORDER — NAPHAZOLINE-PHENIRAMINE 0.027-0.315 % OP SOLN: 1.0000 [drp] | Freq: Two times a day (BID) | OPHTHALMIC | Status: DC | PRN

## 2013-05-19 MED ORDER — ALLOPURINOL 300 MG PO TABS
300.0000 mg | ORAL_TABLET | Freq: Every day | ORAL | Status: DC
  Administered 2013-05-19 – 2013-05-22 (×4): 300 mg via ORAL
  Filled 2013-05-19 (×5): qty 1

## 2013-05-19 MED ORDER — HEPARIN (PORCINE) IN NACL 100-0.45 UNIT/ML-% IJ SOLN
1100.0000 [IU]/h | INTRAMUSCULAR | Status: DC
  Administered 2013-05-19: 650 [IU]/h via INTRAVENOUS
  Administered 2013-05-20: 1000 [IU]/h via INTRAVENOUS
  Administered 2013-05-20: 800 [IU]/h via INTRAVENOUS
  Administered 2013-05-21: 1100 [IU]/h via INTRAVENOUS
  Filled 2013-05-19 (×4): qty 250

## 2013-05-19 MED ORDER — ONDANSETRON HCL 4 MG PO TABS: 4.0000 mg | ORAL_TABLET | Freq: Four times a day (QID) | ORAL | Status: DC | PRN

## 2013-05-19 MED ORDER — LEVOTHYROXINE SODIUM 88 MCG PO TABS
88.0000 ug | ORAL_TABLET | Freq: Every day | ORAL | Status: DC
  Administered 2013-05-20 – 2013-05-22 (×3): 88 ug via ORAL
  Filled 2013-05-19 (×4): qty 1

## 2013-05-19 MED ORDER — SIMVASTATIN 10 MG PO TABS
10.0000 mg | ORAL_TABLET | Freq: Every day | ORAL | Status: DC
  Administered 2013-05-20 – 2013-05-21 (×2): 10 mg via ORAL
  Filled 2013-05-19 (×3): qty 1

## 2013-05-19 MED ORDER — ONDANSETRON HCL 4 MG/2ML IJ SOLN: 4.0000 mg | Freq: Four times a day (QID) | INTRAMUSCULAR | Status: DC | PRN

## 2013-05-19 MED ORDER — SODIUM CHLORIDE 0.9 % IJ SOLN: 3.0000 mL | INTRAMUSCULAR | Status: DC | PRN

## 2013-05-19 MED ORDER — ZOLPIDEM TARTRATE 5 MG PO TABS
5.0000 mg | ORAL_TABLET | Freq: Every evening | ORAL | Status: DC | PRN
  Administered 2013-05-19: 5 mg via ORAL
  Filled 2013-05-19: qty 1

## 2013-05-19 MED ORDER — DIGOXIN 125 MCG PO TABS
0.1250 mg | ORAL_TABLET | Freq: Every day | ORAL | Status: DC
  Administered 2013-05-20 – 2013-05-22 (×3): 0.125 mg via ORAL
  Filled 2013-05-19 (×3): qty 1

## 2013-05-19 MED ORDER — PANTOPRAZOLE SODIUM 40 MG PO TBEC
40.0000 mg | DELAYED_RELEASE_TABLET | Freq: Every day | ORAL | Status: DC
  Administered 2013-05-20 – 2013-05-22 (×3): 40 mg via ORAL
  Filled 2013-05-19 (×2): qty 1

## 2013-05-19 MED ORDER — SODIUM CHLORIDE 0.9 % IJ SOLN
3.0000 mL | Freq: Two times a day (BID) | INTRAMUSCULAR | Status: DC
  Administered 2013-05-19 – 2013-05-22 (×5): 3 mL via INTRAVENOUS

## 2013-05-19 MED ORDER — FUROSEMIDE 20 MG PO TABS
20.0000 mg | ORAL_TABLET | Freq: Every day | ORAL | Status: DC | PRN
  Filled 2013-05-19: qty 2

## 2013-05-19 MED ORDER — HYDROCHLOROTHIAZIDE 12.5 MG PO CAPS
12.5000 mg | ORAL_CAPSULE | Freq: Every day | ORAL | Status: DC
  Administered 2013-05-20 – 2013-05-22 (×3): 12.5 mg via ORAL
  Filled 2013-05-19 (×3): qty 1

## 2013-05-19 MED ORDER — AMLODIPINE BESYLATE 5 MG PO TABS
5.0000 mg | ORAL_TABLET | Freq: Every day | ORAL | Status: DC
Start: 2013-05-20 — End: 2013-05-22
  Administered 2013-05-20 – 2013-05-22 (×3): 5 mg via ORAL
  Filled 2013-05-19 (×3): qty 1

## 2013-05-19 MED ORDER — NAPHAZOLINE-PHENIRAMINE 0.025-0.3 % OP SOLN
1.0000 [drp] | Freq: Two times a day (BID) | OPHTHALMIC | Status: DC | PRN
  Filled 2013-05-19: qty 15

## 2013-05-19 MED ORDER — METOPROLOL TARTRATE 12.5 MG HALF TABLET
12.5000 mg | ORAL_TABLET | Freq: Every day | ORAL | Status: DC
  Administered 2013-05-20 – 2013-05-22 (×4): 12.5 mg via ORAL
  Filled 2013-05-19 (×3): qty 1

## 2013-05-19 MED ORDER — OLMESARTAN-AMLODIPINE-HCTZ 40-5-12.5 MG PO TABS: 1.0000 | ORAL_TABLET | Freq: Every day | ORAL | Status: DC

## 2013-05-19 MED ORDER — METOPROLOL TARTRATE 25 MG PO TABS
25.0000 mg | ORAL_TABLET | Freq: Every day | ORAL | Status: DC
  Administered 2013-05-19 – 2013-05-21 (×3): 25 mg via ORAL
  Filled 2013-05-19 (×4): qty 1

## 2013-05-19 MED ORDER — SODIUM CHLORIDE 0.9 % IV SOLN
250.0000 mL | INTRAVENOUS | Status: DC | PRN
  Administered 2013-05-20: 250 mL via INTRAVENOUS

## 2013-05-19 NOTE — Progress Notes (Signed)
ANTICOAGULATION CONSULT NOTE - Initial Consult  Pharmacy Consult for Heparin Indication: atrial fibrillation and St. Jude AV  Allergies  Allergen Reactions  . Amiodarone Shortness Of Breath  . Alendronate Sodium     GI upset  . Macrodantin [Nitrofurantoin]     GI upset  . Meclizine Swelling    tongue  . Norvasc [Amlodipine Besylate]     edema  . Tussionex Pennkinetic Er [Hydrocod Polst-Cpm Polst Er] Other (See Comments)    Reaction unknown    Patient Measurements: Height: 5' (152.4 cm) Weight: 122 lb 9.6 oz (55.611 kg) (scale A) IBW/kg (Calculated) : 45.5 Heparin Dosing Weight: 55 kg  Vital Signs: Temp: 97.7 F (36.5 C) (02/18 1500) Temp src: Oral (02/18 1500) BP: 189/81 mmHg (02/18 1500) Pulse Rate: 73 (02/18 1500)  Labs:  Recent Labs  05/17/13 05/17/13 1130 05/19/13 1204  HGB  --  10.2* 10.5*  HCT  --  31.8* 32.4*  PLT  --  162 157  LABPROT  --   --  21.2*  INR 3 3.00 1.90*  CREATININE  --   --  0.71   Estimated Creatinine Clearance: 40.9 ml/min (by C-G formula based on Cr of 0.71).  Medical History: Past Medical History  Diagnosis Date  . Hypothyroidism   . Cancer     h/o breast cancer  . Stomach problems     seeing Dr Watt Climes  . Hypertension   . Pneumonia   . GERD (gastroesophageal reflux disease)   . Anemia   . Arthritis   . Complication of anesthesia     could not sleep after  . Arrhythmia     Atrial Fibrillation    Medications:  Coumadin 2.5mg  on Monday and Friday, 5mg  all other days.   Assessment: 72 YOF on Coumadin prior to admission for atrial fibrillation and St. Jude mechanical aortic valve now admitted with melena but not hypotensive or tachycardic with past history of GI bleed (Note patient has had EGD, colonoscopy, and capsule endoscopy in past with no source of bleeding found). Dr. Eliseo Squires is aware. Patient needs full anticoagulation due to valve. Will target a lower goal of 0.3 to 0.5 due to high risk of bleed with conservative  dosing. H/H is down at 10.5/32.4. Platelets 157. INR today is 1.90. Last dose of Coumadin was on 05/18/13.   Goal of Therapy:  Heparin level 0.3 to 0.5 units/ml Monitor platelets by anticoagulation protocol: Yes   Plan:  1. Heparin drip at 650 units/hr. 2. No bolus. 3. Heparin level in 8 hours to confirm.  4. Daily heparin level and CBC while on therapy. 5. Monitor closely for increased melena or drops in CBC.   Sloan Leiter, PharmD, BCPS Clinical Pharmacist [phone] 05/19/2013,4:10 PM

## 2013-05-19 NOTE — H&P (Signed)
History and Physical  Deborah [name] ASN:[id number] DOB: 03/02/1927 DOA: 05/19/2013  Referring physician: er PCP: Darlin Coco, MD   Chief Complaint: Deborah [name]  HPI: Deborah [name] is a 80 y.o. female  Who follows with Dr. Mare Ferrari.  She has a past medical history of anemia, atrial fibrillation and GI bleed who presents to the emergency department after being sent by gastroenterology office to see Dr. Penelope Coop with gastroenterology (usually see Dr. Tami Lin). Patient states she has had increased dark bloody stools over the past 11-12 days. She is on Coumadin has frequent blood tests, she was told she had a 2 point drop in her hemoglobin over the past 10 days, last checked on Monday which was 10.3- baseline 12..   Denies abdominal pain or lightheadedness. +weakness,fatigue.  States she occasionally has abdominal cramping which is not present currently. Last INR 3 two days ago now 1.9- has valve  Patient has had an EGD, colonoscopy and capsule endoscopy with no source of bleeding found  Review of Systems:  All systems reviewed, negative unless stated above   Past Medical History  Diagnosis Date  . Hypothyroidism   . Cancer     h/o breast cancer  . Stomach problems     seeing Dr Watt Climes  . Hypertension   . Pneumonia   . GERD (gastroesophageal reflux disease)   . Anemia   . Arthritis   . Complication of anesthesia     could not sleep after  . Arrhythmia     Atrial Fibrillation   Past Surgical History  Procedure Laterality Date  . Cardiac valve replacement      st jude 1996  . Arteriogram  01/12    NO RAS   . Cardioversion      X 2  . Breast surgery      right lumpectomy  . Cardiac catheterization    . Eye surgery      bilateral cataract with lens implants  . Cholecystectomy    . Abdominal hysterectomy    . Appendectomy    . Colonoscopy with propofol N/A 11/24/2012    Procedure: COLONOSCOPY WITH PROPOFOL;  Surgeon: Jeryl Columbia, MD;  Location: [facility];  Service:  Endoscopy;  Laterality: N/A;   Social History:  reports that she quit smoking about 25 years ago. She does not have any smokeless tobacco history on file. She reports that she drinks alcohol. She reports that she does not use illicit drugs.  Allergies  Allergen Reactions  . Amiodarone Shortness Of Breath  . Alendronate Sodium     GI upset  . Macrodantin [Nitrofurantoin]     GI upset  . Meclizine Swelling    tongue  . Norvasc [Amlodipine Besylate]     edema  . Tussionex Pennkinetic Er [Hydrocod Polst-Cpm Polst Er] Other (See Comments)    Reaction unknown    Family History  Problem Relation Age of Onset  . Dementia Mother   . Coronary artery disease Father      Prior to Admission medications   Medication Sig Start Date End Date Taking? Authorizing Provider  allopurinol (ZYLOPRIM) 300 MG tablet Take 300 mg by mouth daily.   Yes Historical Provider, MD  calcium-vitamin D 500 MG tablet Take 1 tablet by mouth 2 (two) times daily.     Yes Historical Provider, MD  celecoxib (CELEBREX) 200 MG capsule Take 200 mg by mouth daily.   Yes Historical Provider, MD  dicyclomine (BENTYL) 10 MG capsule Take 10 mg by  mouth as needed. For stomach cramps   Yes Historical Provider, MD  digoxin (LANOXIN) 0.25 MG tablet Take 0.125 mg by mouth daily. In the morning   Yes Historical Provider, MD  fexofenadine (ALLEGRA ALLERGY) 180 MG tablet Take 180 mg by mouth daily. For allergies. As needed   Yes Historical Provider, MD  furosemide (LASIX) 40 MG tablet Take 20-40 mg by mouth daily as needed for fluid.   Yes Historical Provider, MD  lansoprazole (PREVACID) 30 MG capsule Take 30 mg by mouth daily.     Yes Historical Provider, MD  levothyroxine (SYNTHROID, LEVOTHROID) 88 MCG tablet Take 88 mcg by mouth daily before breakfast.   Yes Historical Provider, MD  metoprolol tartrate (LOPRESSOR) 25 MG tablet Take 12.5-25 mg by mouth 2 (two) times daily. Take 0.5 tablet in AM and 1 tablet in PM   Yes Historical  Provider, MD  multivitamin [facility]) per tablet Take 1 tablet by mouth daily.    Yes Historical Provider, MD  Naphazoline-Pheniramine (OPCON-A) 0.027-0.315 % SOLN Apply 1 drop to eye 2 (two) times daily as needed (itchy eyes).   Yes Historical Provider, MD  nitroGLYCERIN (NITROSTAT) 0.4 MG SL tablet Place 0.4 mg under the tongue every 5 (five) minutes as needed for chest pain.   Yes Historical Provider, MD  Olmesartan-Amlodipine-HCTZ (TRIBENZOR) 40-5-12.5 MG TABS Take 1 tablet by mouth every morning.   Yes Historical Provider, MD  Olmesartan-Amlodipine-HCTZ (TRIBENZOR) 40-5-12.5 MG TABS Take 1 tablet by mouth daily.   Yes Historical Provider, MD  Omega-3 Fatty Acids (FISH OIL) 1000 MG CAPS Take 1,000 mg by mouth 2 (two) times daily.    Yes Historical Provider, MD  potassium chloride SA (K-DUR,KLOR-CON) 20 MEQ tablet Take 20 mEq by mouth daily.   Yes Historical Provider, MD  prednisoLONE acetate (PRED FORTE) 1 % ophthalmic suspension Place 1 drop into both eyes 4 (four) times daily.   Yes Historical Provider, MD  simvastatin (ZOCOR) 20 MG tablet Take 10 mg by mouth daily.   Yes Historical Provider, MD  warfarin (COUMADIN) 5 MG tablet Take 2.5-5 mg by mouth daily. Take 2.5mg  on Mon and Fri. Take 5mg  all other days   Yes Historical Provider, MD  zolpidem (AMBIEN CR) 12.5 MG CR tablet Take 12.5 mg by mouth at bedtime as needed for sleep.   Yes Historical Provider, MD   Physical Exam: Filed Vitals:   05/19/13 1402  BP: 140/67  Pulse: 71  Temp: 97.8 F (36.6 C)  Resp: 16    BP 140/67  Pulse 71  Temp(Src) 97.8 F (36.6 C) (Oral)  Resp 16  Ht 5' (1.524 m)  Wt 56.246 kg (124 lb)  BMI 24.22 kg/m2  SpO2 98%  General:  Appears calm and comfortable Eyes: PERRL, normal lids, irises & conjunctiva ENT: grossly normal hearing, lips & tongue Neck: no LAD, masses or thyromegaly Cardiovascular: irregular; +mumur Respiratory: CTA bilaterally, no w/r/r. Normal respiratory effort. Abdomen: soft,  ntnd Skin: no rash or induration seen on limited exam Musculoskeletal: grossly normal tone BUE/BLE Psychiatric: grossly normal mood and affect, speech fluent and appropriate Neurologic: grossly non-focal.          Labs on Admission:  Basic Metabolic Panel:  Recent Labs Lab 05/19/13 1204  NA 137  K 4.3  CL 96  CO2 26  GLUCOSE 96  BUN 22  CREATININE 0.71  CALCIUM 9.9   Liver Function Tests:  Recent Labs Lab 05/19/13 1204  AST 34  ALT 26  ALKPHOS 57  BILITOT  0.3  PROT 6.8  ALBUMIN 3.8   No results found for this basename: LIPASE, AMYLASE,  in the last 168 hours No results found for this basename: AMMONIA,  in the last 168 hours CBC:  Recent Labs Lab 05/17/13 1130 05/19/13 1204  WBC 4.6 4.2  NEUTROABS 3.2  --   HGB 10.2* 10.5*  HCT 31.8* 32.4*  MCV 101.0 102.9*  PLT 162 157   Cardiac Enzymes: No results found for this basename: CKTOTAL, CKMB, CKMBINDEX, TROPONINI,  in the last 168 hours  BNP (last 3 results) No results found for this basename: PROBNP,  in the last 8760 hours CBG: No results found for this basename: GLUCAP,  in the last 168 hours  Radiological Exams on Admission: No results found.    Assessment/Plan Active Problems:   S/P aortic valve replacement with metallic valve   Atrial fibrillation   Dyslipidemia   Iron deficiency anemia secondary to blood loss (chronic)   Melena   1. Melena- has had EGD, colonoscopy, capsule endo with no source in past, H/H in AM- GI consult for further recommendations, no hypotensive or tachy 2. AVR- heparin, hold coumadin (has done lovenox in past) 3. HTN/HLD- stable 4. Fe def anemia- MCV actually large- monitor and transfuse as needed  GI  Code Status: full Family Communication: patient/son Disposition Plan: admit  Time spent: 75 min  Deborah [name] [facility] Pager [phone]

## 2013-05-19 NOTE — Progress Notes (Signed)
Patient alert and oriented x4, vital signs stable this afternoon.  Daughter at bedside.  Patient denies any questions or concerns at this time.  No order for telemetry placed.  Given telephone order by MD to start patient on heart healthy diet and placed order.  Will continue to monitor.

## 2013-05-19 NOTE — ED Notes (Signed)
Report called to Emily, RN

## 2013-05-19 NOTE — Consult Note (Signed)
Subjective:   HPI  The patient is an 80 year old female with multiple medical problems who we are asked to see in regards to melena for the past 11 days with a drop in hemoglobin. She does have a history of atrial fibrillation and has had a St. Jude cardiac valve surgery in the past. She is on chronic Coumadin therapy. She has also had anemia and gastrointestinal bleeding before.  She has been complaining of melena for the past 11 days and her hemoglobin has dropped 2 g over the past couple of weeks. Hemoglobin and hematocrit today are 10.5 and 32.4 respectively. INR is 1.9.  On 12/26/2011 she had an EGD which showed a hiatal hernia and some gastric fundal gland polyps. On 07/09/2012 she had a capsule endoscopy which raised the question of a gastric AVM, and there was fresh blood seen in the right colon. On 11/24/2012 she had a colonoscopy with findings of 1 AVM in the proximal descending colon which was cauterized using the argon plasma coagulator.  Review of Systems Denies chest pain or shortness of breath  Past Medical History  Diagnosis Date  . Hypothyroidism   . Cancer     h/o breast cancer  . Stomach problems     seeing Dr Watt Climes  . Hypertension   . Pneumonia   . GERD (gastroesophageal reflux disease)   . Anemia   . Arthritis   . Complication of anesthesia     could not sleep after  . Arrhythmia     Atrial Fibrillation   Past Surgical History  Procedure Laterality Date  . Cardiac valve replacement      st jude 1996  . Arteriogram  01/12    NO RAS   . Cardioversion      X 2  . Breast surgery      right lumpectomy  . Cardiac catheterization    . Eye surgery      bilateral cataract with lens implants  . Cholecystectomy    . Abdominal hysterectomy    . Appendectomy    . Colonoscopy with propofol N/A 11/24/2012    Procedure: COLONOSCOPY WITH PROPOFOL;  Surgeon: Jeryl Columbia, MD;  Location: [facility];  Service: Endoscopy;  Laterality: N/A;   History   Social History   . Marital Status: Widowed    Spouse Name: N/A    Number of Children: N/A  . Years of Education: N/A   Occupational History  . Not on file.   Social History Main Topics  . Smoking status: Former Smoker    Quit date: 04/01/1988  . Smokeless tobacco: Not on file  . Alcohol Use: Yes     Comment: burbon or glass wine daily  . Drug Use: No  . Sexual Activity: Not on file   Other Topics Concern  . Not on file   Social History Narrative  . No narrative on file   family history includes Coronary artery disease in her father; Dementia in her mother. Current facility-administered medications:0.9 %  sodium chloride infusion, 250 mL, Intravenous, PRN, Geradine Girt, DO;  allopurinol (ZYLOPRIM) tablet 300 mg, 300 mg, Oral, Daily, Jessica U Vann, DO;  [START ON 05/20/2013] amLODipine (NORVASC) tablet 5 mg, 5 mg, Oral, Daily, Jessica U Vann, DO;  [START ON 05/20/2013] digoxin (LANOXIN) tablet 0.125 mg, 0.125 mg, Oral, Daily, Jessica U Vann, DO furosemide (LASIX) tablet 20-40 mg, 20-40 mg, Oral, Daily PRN, Janett Billow U Vann, DO;  heparin ADULT infusion 100 units/mL (25000 units/250 mL), 650 Units/hr,  Intravenous, Continuous, Charmian Muff [place], [place];  Derrill Memo ON 05/20/2013] hydrochlorothiazide (MICROZIDE) capsule 12.5 mg, 12.5 mg, Oral, Daily, Jessica U Vann, DO;  [START ON 05/20/2013] irbesartan (AVAPRO) tablet 300 mg, 300 mg, Oral, Daily, Jessica U Vann, DO [START ON 05/20/2013] levothyroxine (SYNTHROID, LEVOTHROID) tablet 88 mcg, 88 mcg, Oral, QAC breakfast, Jessica U Vann, DO;  [START ON 05/20/2013] metoprolol tartrate (LOPRESSOR) tablet 12.5 mg, 12.5 mg, Oral, Daily, Jessica U Vann, DO;  metoprolol tartrate (LOPRESSOR) tablet 25 mg, 25 mg, Oral, QHS, Jessica U Vann, DO;  naphazoline-pheniramine (NAPHCON-A) 0.025-0.3 % ophthalmic solution 1 drop, 1 drop, Both Eyes, BID PRN, Geradine Girt, DO ondansetron (ZOFRAN) injection 4 mg, 4 mg, Intravenous, Q6H PRN, Jessica U Vann, DO;  ondansetron (ZOFRAN) tablet 4  mg, 4 mg, Oral, Q6H PRN, Geradine Girt, DO;  [START ON 05/20/2013] pantoprazole (PROTONIX) EC tablet 40 mg, 40 mg, Oral, Daily, Jessica U Vann, DO;  prednisoLONE acetate (PRED FORTE) 1 % ophthalmic suspension 1 drop, 1 drop, Both Eyes, QID, Jessica U Vann, DO [START ON 05/20/2013] simvastatin (ZOCOR) tablet 10 mg, 10 mg, Oral, q1800, Jessica U Vann, DO;  sodium chloride 0.9 % injection 3 mL, 3 mL, Intravenous, Q12H, Jessica U Vann, DO;  sodium chloride 0.9 % injection 3 mL, 3 mL, Intravenous, PRN, Geradine Girt, DO;  zolpidem (AMBIEN) tablet 5 mg, 5 mg, Oral, QHS PRN, Geradine Girt, DO Allergies  Allergen Reactions  . Amiodarone Shortness Of Breath  . Alendronate Sodium     GI upset  . Macrodantin [Nitrofurantoin]     GI upset  . Meclizine Swelling    tongue  . Norvasc [Amlodipine Besylate]     edema  . Tussionex Pennkinetic Er [Hydrocod Polst-Cpm Polst Er] Other (See Comments)    Reaction unknown     Objective:     BP 189/81  Pulse 73  Temp(Src) 97.7 F (36.5 C) (Oral)  Resp 18  Ht 5' (1.524 m)  Wt 55.611 kg (122 lb 9.6 oz)  BMI 23.94 kg/m2  SpO2 98%  She is in no distress  Nonicteric  Heart irregular rhythm  Lungs clear  Abdomen: Bowel sounds normal, soft, nontender  Laboratory No components found with this basename: d1      Assessment:     Recurrent gastrointestinal bleeding characterized by melena in an 80 year old female who is on Coumadin. This is most likely related to recurring AVMs somewhere in the gastrointestinal tract.      Plan:     Given the persistence of melena for the past 11 days and 2 g drop in hemoglobin her Coumadin is going to be held. She will be started on IV heparin by pharmacy with cardiac valve protocol. We will follow her hemoglobin and hematocrit. At the present time she is reluctant to proceed with further endoscopic evaluation. She would like to wait and see if this stops spontaneously. We will reassess tomorrow. I have spoken with  Dr.Brackbill her cardiologist who is aware that she is here and agrees with holding the Coumadin and starting heparin for now.  Lab Results  Component Value Date   HGB 10.5* 05/19/2013   HGB 10.2* 05/17/2013   HGB 12.5 05/05/2013   HGB 12.1 04/28/2013   HGB 10.8* 12/31/2012   HGB 12.4 12/08/2012   HCT 32.4* 05/19/2013   HCT 31.8* 05/17/2013   HCT 37.5 05/05/2013   HCT 36.6 04/28/2013   HCT 38.4 12/08/2012   HCT 29.5* 11/11/2012   ALKPHOS 57 05/19/2013   ALKPHOS  56 01/20/2013   ALKPHOS 50 11/09/2012   ALKPHOS 54 06/03/2012   AST 34 05/19/2013   AST 17 01/20/2013   AST 22 11/09/2012   AST 36 06/03/2012   ALT 26 05/19/2013   ALT 20 01/20/2013   ALT 20 11/09/2012   ALT 39* 06/03/2012

## 2013-05-19 NOTE — ED Provider Notes (Signed)
CSN: [id number]     Arrival date & time 05/19/13  1116 History   First MD Initiated Contact with Patient 05/19/13 1141     Chief Complaint  Patient presents with  . Rectal Bleeding     (Consider location/radiation/quality/duration/timing/severity/associated sxs/prior Treatment) HPI Comments: Patient is an 80 year old female with a past medical history of anemia, atrial fibrillation and GI bleed who presents to the emergency department after being sent by gastroenterology office to see Dr. Penelope Coop with gastroenterology. Patient states she has had increased dark bloody stools over the past 11-12 days. She is on Coumadin has frequent blood tests, she was told she had a 2. drop in her hemoglobin over the past 10 days, last checked on Monday which was 10.3. Normally sees Dr. Watt Climes, however he is not in the office today. Denies weakness, fatigue, abdominal pain, lightheadedness. States she occasionally has abdominal cramping which is not present currently. Last INR 3 two days ago.  Patient is a 80 y.o. female presenting with hematochezia. The history is provided by the patient and the spouse.  Rectal Bleeding   Past Medical History  Diagnosis Date  . Hypothyroidism   . Cancer     h/o breast cancer  . Stomach problems     seeing Dr Watt Climes  . Hypertension   . Pneumonia   . GERD (gastroesophageal reflux disease)   . Anemia   . Arthritis   . Complication of anesthesia     could not sleep after  . Arrhythmia     Atrial Fibrillation   Past Surgical History  Procedure Laterality Date  . Cardiac valve replacement      st jude 1996  . Arteriogram  01/12    NO RAS   . Cardioversion      X 2  . Breast surgery      right lumpectomy  . Cardiac catheterization    . Eye surgery      bilateral cataract with lens implants  . Cholecystectomy    . Abdominal hysterectomy    . Appendectomy    . Colonoscopy with propofol N/A 11/24/2012    Procedure: COLONOSCOPY WITH PROPOFOL;  Surgeon: Jeryl Columbia, MD;  Location: [facility];  Service: Endoscopy;  Laterality: N/A;   Family History  Problem Relation Age of Onset  . Dementia Mother   . Coronary artery disease Father    History  Substance Use Topics  . Smoking status: Former Smoker    Quit date: 04/01/1988  . Smokeless tobacco: Not on file  . Alcohol Use: Yes     Comment: burbon or glass wine daily   OB History   Grav Para Term Preterm Abortions TAB SAB Ect Mult Living                 Review of Systems  Gastrointestinal: Positive for blood in stool and hematochezia.  All other systems reviewed and are negative.      Allergies  Amiodarone; Alendronate sodium; Macrodantin; Meclizine; Norvasc; and Tussionex pennkinetic er  Home Medications   Current Outpatient Rx  Name  Route  Sig  Dispense  Refill  . allopurinol (ZYLOPRIM) 300 MG tablet   Oral   Take 300 mg by mouth daily.         . calcium-vitamin D 500 MG tablet   Oral   Take 1 tablet by mouth 2 (two) times daily.           . celecoxib (CELEBREX) 200 MG capsule   Oral  Take 200 mg by mouth daily.         Marland Kitchen dicyclomine (BENTYL) 10 MG capsule   Oral   Take 10 mg by mouth as needed. For stomach cramps         . digoxin (LANOXIN) 0.25 MG tablet   Oral   Take 0.125 mg by mouth daily. In the morning         . fexofenadine (ALLEGRA ALLERGY) 180 MG tablet   Oral   Take 180 mg by mouth daily. For allergies. As needed         . furosemide (LASIX) 40 MG tablet   Oral   Take 20-40 mg by mouth daily as needed for fluid.         Marland Kitchen lansoprazole (PREVACID) 30 MG capsule   Oral   Take 30 mg by mouth daily.           Marland Kitchen levothyroxine (SYNTHROID, LEVOTHROID) 88 MCG tablet   Oral   Take 88 mcg by mouth daily before breakfast.         . metoprolol tartrate (LOPRESSOR) 25 MG tablet   Oral   Take 12.5-25 mg by mouth 2 (two) times daily. Take 0.5 tablet in AM and 1 tablet in PM         . multivitamin (THERAGRAN) per tablet   Oral    Take 1 tablet by mouth daily.          . Naphazoline-Pheniramine (OPCON-A) 0.027-0.315 % SOLN   Ophthalmic   Apply 1 drop to eye 2 (two) times daily as needed (itchy eyes).         . nitroGLYCERIN (NITROSTAT) 0.4 MG SL tablet   Sublingual   Place 0.4 mg under the tongue every 5 (five) minutes as needed for chest pain.         . Olmesartan-Amlodipine-HCTZ (TRIBENZOR) 40-5-12.5 MG TABS   Oral   Take 1 tablet by mouth every morning.         . Olmesartan-Amlodipine-HCTZ (TRIBENZOR) 40-5-12.5 MG TABS   Oral   Take 1 tablet by mouth daily.         . Omega-3 Fatty Acids (FISH OIL) 1000 MG CAPS   Oral   Take 1,000 mg by mouth 2 (two) times daily.          . potassium chloride SA (K-DUR,KLOR-CON) 20 MEQ tablet   Oral   Take 20 mEq by mouth daily.         . prednisoLONE acetate (PRED FORTE) 1 % ophthalmic suspension   Both Eyes   Place 1 drop into both eyes 4 (four) times daily.         . simvastatin (ZOCOR) 20 MG tablet   Oral   Take 10 mg by mouth daily.         Marland Kitchen warfarin (COUMADIN) 5 MG tablet   Oral   Take 2.5-5 mg by mouth daily. Take 2.5mg  on Mon and Fri. Take 5mg  all other days         . zolpidem (AMBIEN CR) 12.5 MG CR tablet   Oral   Take 12.5 mg by mouth at bedtime as needed for sleep.          BP 140/67  Pulse 71  Temp(Src) 97.8 F (36.6 C) (Oral)  Resp 16  Ht 5' (1.524 m)  Wt 124 lb (56.246 kg)  BMI 24.22 kg/m2  SpO2 98% Physical Exam  Nursing note and vitals reviewed. Constitutional: She is oriented to  person, place, and time. She appears well-developed and well-nourished. No distress.  HENT:  Head: Normocephalic and atraumatic.  Mouth/Throat: Oropharynx is clear and moist. Mucous membranes are not pale.  Eyes: Conjunctivae are normal.  Neck: Normal range of motion. Neck supple.  Cardiovascular: Normal rate, regular rhythm, normal heart sounds and intact distal pulses.   Pulmonary/Chest: Effort normal and breath sounds normal.   Abdominal: Soft. Bowel sounds are normal. There is no tenderness.  Musculoskeletal: Normal range of motion. She exhibits no edema.  Neurological: She is alert and oriented to person, place, and time.  Skin: Skin is warm and dry. She is not diaphoretic.  Psychiatric: She has a normal mood and affect. Her behavior is normal.    ED Course  Procedures (including critical care time) Labs Review Labs Reviewed  CBC - Abnormal; Notable for the following:    RBC 3.15 (*)    Hemoglobin 10.5 (*)    HCT 32.4 (*)    MCV 102.9 (*)    All other components within normal limits  COMPREHENSIVE METABOLIC PANEL - Abnormal; Notable for the following:    GFR calc non Af Amer 77 (*)    GFR calc Af Amer 89 (*)    All other components within normal limits  PROTIME-INR - Abnormal; Notable for the following:    Prothrombin Time 21.2 (*)    INR 1.90 (*)    All other components within normal limits   Imaging Review No results found.  EKG Interpretation   None       MDM   Final diagnoses:  Melena   Patient presenting to seeing GI Dr. Penelope Coop due to melena. She is well appearing and in no apparent distress, stable vital signs. Hemodynamically stable. Labs pending.  Labs showing hemoglobin of 10.5, no drop from 2 days ago of 10.2. INR 1.90. Vitals remained stable. I spoke with Dr. Penelope Coop who will evaluate patient once admitted. Patient will be admitted to hospitalist, admission accepted by Dr. Eliseo Squires, [facility].  Case discussed with attending Dr. Venora Maples who also evaluated patient and agrees with plan of care.   Illene Labrador, PA-C 05/19/13 1424

## 2013-05-19 NOTE — ED Provider Notes (Signed)
Medical screening examination/treatment/procedure(s) were conducted as a shared visit with non-physician practitioner(s) and myself.  I personally evaluated the patient during the encounter.    Admit for suspected GI bleed. Likely need for endoscopy. GI consult. hospitalist admission  Hoy Morn, MD 05/19/13 [phone]

## 2013-05-19 NOTE — ED Notes (Addendum)
Pt reports black stools, rectal bleeding x 11-12 days. States sent here by MD for a test, states something "upper". Here to see Dr. Penelope Coop for test. HGB on Monday 10.3. Occasional abdominal cramping, no dizziness.

## 2013-05-20 DIAGNOSIS — Z7901 Long term (current) use of anticoagulants: Secondary | ICD-10-CM

## 2013-05-20 DIAGNOSIS — D689 Coagulation defect, unspecified: Secondary | ICD-10-CM

## 2013-05-20 LAB — CBC
HCT: 28 % — ABNORMAL LOW (ref 36.0–46.0)
HEMOGLOBIN: 9.5 g/dL — AB (ref 12.0–15.0)
MCH: 34.9 pg — AB (ref 26.0–34.0)
MCHC: 33.9 g/dL (ref 30.0–36.0)
MCV: 102.9 fL — AB (ref 78.0–100.0)
Platelets: 139 10*3/uL — ABNORMAL LOW (ref 150–400)
RBC: 2.72 MIL/uL — ABNORMAL LOW (ref 3.87–5.11)
RDW: 15.6 % — ABNORMAL HIGH (ref 11.5–15.5)
WBC: 4.1 10*3/uL (ref 4.0–10.5)

## 2013-05-20 LAB — BASIC METABOLIC PANEL
BUN: 22 mg/dL (ref 6–23)
CHLORIDE: 95 meq/L — AB (ref 96–112)
CO2: 28 mEq/L (ref 19–32)
CREATININE: 0.66 mg/dL (ref 0.50–1.10)
Calcium: 9.5 mg/dL (ref 8.4–10.5)
GFR calc Af Amer: >90 mL/min (ref >90)
GFR calc non Af Amer: 79 mL/min — ABNORMAL LOW (ref >90)
GLUCOSE: 111 mg/dL — AB (ref 70–99)
POTASSIUM: 3.4 meq/L — AB (ref 3.7–5.3)
Sodium: 135 mEq/L — ABNORMAL LOW (ref 137–147)

## 2013-05-20 LAB — VITAMIN B12: Vitamin B-12: 563 pg/mL (ref 211–911)

## 2013-05-20 LAB — HEPARIN LEVEL (UNFRACTIONATED)
Heparin Unfractionated: <0.1 IU/mL — ABNORMAL LOW (ref 0.30–0.70)
Heparin Unfractionated: <0.1 IU/mL — ABNORMAL LOW (ref 0.30–0.70)

## 2013-05-20 LAB — PROTIME-INR
INR: 1.9 — ABNORMAL HIGH (ref 0.00–1.49)
Prothrombin Time: 21.2 seconds — ABNORMAL HIGH (ref 11.6–15.2)

## 2013-05-20 LAB — FOLATE RBC: RBC Folate: 1168 ng/mL — ABNORMAL HIGH (ref >280)

## 2013-05-20 MED ORDER — FERROUS SULFATE 325 (65 FE) MG PO TABS
325.0000 mg | ORAL_TABLET | Freq: Three times a day (TID) | ORAL | Status: DC
  Filled 2013-05-20 (×3): qty 1

## 2013-05-20 NOTE — Progress Notes (Addendum)
[facility] PROGRESS NOTE Interim History: 80 year old female with multiple medical problems who we are asked to see in regards to melena for the past 11 days with a drop in hemoglobin. She does have a history of atrial fibrillation and has had a St. Jude cardiac valve surgery in the past. She is on chronic Coumadin therapy. Comes in with melena.  Assessment/Plan: Melena: - EGD, colonoscopy, capsule endo with no source in past, H/H in AM- GI consulted - Rec holding coumadin, pt refused colonoscopy or EGD. - Re-check CBC in am, monitor Hbg. - ? Due to AVM, she does have an aortic metallic valve.  Iron deficiency anemia secondary to blood loss (chronic) - ferrous sulfate.   Atrial fibrillation/ S/P aortic valve replacement with metallic valve - rate controlled. - INR 1.9 off coumadin, currently on heparin.    Code Status: full  Family Communication: patient/son  Disposition Plan: admit    Consultants:  GI  Procedures:  none  Antibiotics:  none  HPI/Subjective: Wants to go home. Refuses procedure.  Objective: Filed Vitals:   05/20/13 0229 05/20/13 0459 05/20/13 0535 05/20/13 0937  BP: 121/51  112/57 110/50  Pulse: 79  63 67  Temp: 98 F (36.7 C)  98 F (36.7 C)   TempSrc: Oral  Oral   Resp: 17  16 18   Height:      Weight:  55.566 kg (122 lb 8 oz) 55.566 kg (122 lb 8 oz)   SpO2: 93%  97% 95%    Intake/Output Summary (Last 24 hours) at 05/20/13 1027 Last data filed at 05/20/13 0939  Gross per 24 hour  Intake    243 ml  Output   1326 ml  Net  -1083 ml   Filed Weights   05/19/13 1500 05/20/13 0459 05/20/13 0535  Weight: 55.611 kg (122 lb 9.6 oz) 55.566 kg (122 lb 8 oz) 55.566 kg (122 lb 8 oz)    Exam:  General: Alert, awake, oriented x3, in no acute distress.  HEENT: No bruits, no goiter.  Heart: Regular rate and rhythm, without murmurs, rubs, gallops.  Lungs: Good air movement, clear to auscultation.  Abdomen: Soft, nontender,  nondistended, positive bowel sounds.   Data Reviewed: Basic Metabolic Panel:  Recent Labs Lab 05/19/13 1204 05/20/13 0100  NA 137 135*  K 4.3 3.4*  CL 96 95*  CO2 26 28  GLUCOSE 96 111*  BUN 22 22  CREATININE 0.71 0.66  CALCIUM 9.9 9.5   Liver Function Tests:  Recent Labs Lab 05/19/13 1204  AST 34  ALT 26  ALKPHOS 57  BILITOT 0.3  PROT 6.8  ALBUMIN 3.8   No results found for this basename: LIPASE, AMYLASE,  in the last 168 hours No results found for this basename: AMMONIA,  in the last 168 hours CBC:  Recent Labs Lab 05/17/13 1130 05/19/13 1204 05/20/13 0100  WBC 4.6 4.2 4.1  NEUTROABS 3.2  --   --   HGB 10.2* 10.5* 9.5*  HCT 31.8* 32.4* 28.0*  MCV 101.0 102.9* 102.9*  PLT 162 157 139*   Cardiac Enzymes: No results found for this basename: CKTOTAL, CKMB, CKMBINDEX, TROPONINI,  in the last 168 hours BNP (last 3 results) No results found for this basename: PROBNP,  in the last 8760 hours CBG: No results found for this basename: GLUCAP,  in the last 168 hours  No results found for this or any previous visit (from the past 240 hour(s)).   Studies: No results found.  Scheduled  Meds: . allopurinol  300 mg Oral Daily  . irbesartan  300 mg Oral Daily   And  . amLODipine  5 mg Oral Daily   And  . hydrochlorothiazide  12.5 mg Oral Daily  . digoxin  0.125 mg Oral Daily  . levothyroxine  88 mcg Oral QAC breakfast  . metoprolol tartrate  12.5 mg Oral Daily  . metoprolol tartrate  25 mg Oral QHS  . pantoprazole  40 mg Oral Daily  . prednisoLONE acetate  1 drop Both Eyes QID  . simvastatin  10 mg Oral q1800  . sodium chloride  3 mL Intravenous Q12H   Continuous Infusions: . heparin 800 Units/hr (05/20/13 0545)     Charlynne Cousins  [facility] Pager [phone]. If 8PM-8AM, please contact night-coverage at [email or web address], password [facility] 05/20/2013, 10:27 AM  LOS: 1 day

## 2013-05-20 NOTE — Progress Notes (Signed)
Stopped by for "social call". Appreciate your good care.

## 2013-05-20 NOTE — Progress Notes (Signed)
Patient alert and oriented x4, up to [facility] independently throughout shift.  Daughter at bedside this afternoon.  Patient and daughter asked to speak with MD about plan of care; MD notified and at bedside this evening.  Patient and daughter deny any questions or concerns at this time.  Will continue to monitor.

## 2013-05-20 NOTE — Progress Notes (Signed)
[facility] Progress Note  Subjective: The patient states that she feels fine. She has no complaints.  Objective: Vital signs in last 24 hours: Temp:  [97.7 F (36.5 C)-98.3 F (36.8 C)] 98 F (36.7 C) (02/19 0535) Pulse Rate:  [63-85] 67 (02/19 0937) Resp:  [16-18] 18 (02/19 0937) BP: (110-189)/(50-81) 110/50 mmHg (02/19 0937) SpO2:  [93 %-98 %] 95 % (02/19 0937) Weight:  [55.566 kg (122 lb 8 oz)-55.611 kg (122 lb 9.6 oz)] 55.566 kg (122 lb 8 oz) (02/19 0535) Weight change:    PE:  She is in no distress  Hemoglobin has drifted down a little.  Lab Results: Results for orders placed during the hospital encounter of 05/19/13 (from the past 24 hour(s))  VITAMIN B12     Status: None   Collection Time    05/19/13  4:30 PM      Result Value Ref Range   Vitamin B-12 563  211 - 911 pg/mL  FOLATE RBC     Status: Abnormal   Collection Time    05/19/13  4:30 PM      Result Value Ref Range   RBC Folate 1168 (*) >280 ng/mL  BASIC METABOLIC PANEL     Status: Abnormal   Collection Time    05/20/13  1:00 AM      Result Value Ref Range   Sodium 135 (*) 137 - 147 mEq/L   Potassium 3.4 (*) 3.7 - 5.3 mEq/L   Chloride 95 (*) 96 - 112 mEq/L   CO2 28  19 - 32 mEq/L   Glucose, Bld 111 (*) 70 - 99 mg/dL   BUN 22  6 - 23 mg/dL   Creatinine, Ser 0.66  0.50 - 1.10 mg/dL   Calcium 9.5  8.4 - 10.5 mg/dL   GFR calc non Af Amer 79 (*) >90 mL/min   GFR calc Af Amer >90  >90 mL/min  CBC     Status: Abnormal   Collection Time    05/20/13  1:00 AM      Result Value Ref Range   WBC 4.1  4.0 - 10.5 K/uL   RBC 2.72 (*) 3.87 - 5.11 MIL/uL   Hemoglobin 9.5 (*) 12.0 - 15.0 g/dL   HCT 28.0 (*) 36.0 - 46.0 %   MCV 102.9 (*) 78.0 - 100.0 fL   MCH 34.9 (*) 26.0 - 34.0 pg   MCHC 33.9  30.0 - 36.0 g/dL   RDW 15.6 (*) 11.5 - 15.5 %   Platelets 139 (*) 150 - 400 K/uL  PROTIME-INR     Status: Abnormal   Collection Time    05/20/13  1:00 AM      Result Value Ref Range   Prothrombin Time  21.2 (*) 11.6 - 15.2 seconds   INR 1.90 (*) 0.00 - 1.49  HEPARIN LEVEL (UNFRACTIONATED)     Status: Abnormal   Collection Time    05/20/13  1:00 AM      Result Value Ref Range   Heparin Unfractionated <0.10 (*) 0.30 - 0.70 IU/mL    Studies/Results: @RISRSLT24 @    Assessment: Melena in a patient on chronic Coumadin therapy. She has had prior EGD, capsule endoscopy, and colonoscopy. I think the bleeding is most likely secondary to recurrent AVMs.  Plan: The patient does not wish to have endoscopic therapy at this time. Her INR is still elevated. Her Coumadin has been held. She is on IV heparin. Her wishes are to see if this stopped on its own.  She wants to go home tomorrow if possible. If she is stable we could probably send her home on Lovenox instead of Coumadin and monitor her for further bleeding over the next few days. If bleeding persists I told her that she needs to consider having repeat EGD and colonoscopy. She will probably then followup as an outpatient with Dr. Watt Climes.    Wonda Horner 05/20/2013, 2:45 PM  Lab Results  Component Value Date   HGB 9.5* 05/20/2013   HGB 10.5* 05/19/2013   HGB 10.2* 05/17/2013   HGB 12.5 05/05/2013   HGB 12.1 04/28/2013   HGB 10.8* 12/31/2012   HCT 28.0* 05/20/2013   HCT 32.4* 05/19/2013   HCT 31.8* 05/17/2013   HCT 37.5 05/05/2013   HCT 36.6 04/28/2013   HCT 38.4 12/08/2012   ALKPHOS 57 05/19/2013   ALKPHOS 56 01/20/2013   ALKPHOS 50 11/09/2012   ALKPHOS 54 06/03/2012   AST 34 05/19/2013   AST 17 01/20/2013   AST 22 11/09/2012   AST 36 06/03/2012   ALT 26 05/19/2013   ALT 20 01/20/2013   ALT 20 11/09/2012   ALT 39* 06/03/2012

## 2013-05-20 NOTE — Progress Notes (Signed)
ANTICOAGULATION CONSULT NOTE - Follow Up Consult  Pharmacy Consult for Heparin (while warfarin on hold) Indication: atrial fibrillation, mechanical AVR  Allergies  Allergen Reactions  . Amiodarone Shortness Of Breath  . Alendronate Sodium     GI upset  . Macrodantin [Nitrofurantoin]     GI upset  . Meclizine Swelling    tongue  . Norvasc [Amlodipine Besylate]     edema  . Tussionex Pennkinetic Er [Hydrocod Polst-Cpm Polst Er] Other (See Comments)    Reaction unknown    Patient Measurements: Height: 5' (152.4 cm) Weight: 122 lb 9.6 oz (55.611 kg) (scale A) IBW/kg (Calculated) : 45.5 Vital Signs: Temp: 98 F (36.7 C) (02/19 0229) Temp src: Oral (02/19 0229) BP: 121/51 mmHg (02/19 0229) Pulse Rate: 79 (02/19 0229)  Labs:  Recent Labs  05/17/13 1130 05/19/13 1204 05/20/13 0100  HGB 10.2* 10.5* 9.5*  HCT 31.8* 32.4* 28.0*  PLT 162 157 139*  LABPROT  --  21.2* 21.2*  INR 3.00 1.90* 1.90*  HEPARINUNFRC  --   --  <0.10*  CREATININE  --  0.71 0.66    Estimated Creatinine Clearance: 40.9 ml/min (by C-G formula based on Cr of 0.66).   Medications:  Heparin 650 units/hr  Assessment: 80 y/o F on heparin while warfarin on hold in setting of melena (Hgb 9.5 this am, from 10.5), GI following, INR is 1.90 this AM, HL is <0.10, will aim for low end of heparin goal.   Goal of Therapy:  Heparin level 0.3-0.5 units/ml Monitor platelets by anticoagulation protocol: Yes   Plan:  -Increase heparin drip to 800 units/hr -1330 HL -Daily CBC/HL -Monitor for any further drop in Hgb -F/U GI plan  Narda Bonds 05/20/2013,4:47 AM

## 2013-05-20 NOTE — Progress Notes (Signed)
ANTICOAGULATION CONSULT NOTE - Follow Up Consult  Pharmacy Consult for Heparin Indication: atrial fibrillation and St. Jude AVR  Allergies  Allergen Reactions  . Amiodarone Shortness Of Breath  . Alendronate Sodium     GI upset  . Macrodantin [Nitrofurantoin]     GI upset  . Meclizine Swelling    tongue  . Norvasc [Amlodipine Besylate]     edema  . Tussionex Pennkinetic Er [Hydrocod Polst-Cpm Polst Er] Other (See Comments)    Reaction unknown    Patient Measurements: Height: 5' (152.4 cm) Weight: 122 lb 8 oz (55.566 kg) (scale A) IBW/kg (Calculated) : 45.5 Heparin Dosing Weight: 55.5 kg  Vital Signs: Temp: 97.7 F (36.5 C) (02/19 1418) Temp src: Oral (02/19 1418) BP: 117/48 mmHg (02/19 1418) Pulse Rate: 80 (02/19 1418)  Labs:  Recent Labs  05/19/13 1204 05/20/13 0100 05/20/13 1400  HGB 10.5* 9.5*  --   HCT 32.4* 28.0*  --   PLT 157 139*  --   LABPROT 21.2* 21.2*  --   INR 1.90* 1.90*  --   HEPARINUNFRC  --  <0.10* <0.10*  CREATININE 0.71 0.66  --     Estimated Creatinine Clearance: 40.9 ml/min (by C-G formula based on Cr of 0.66).  Assessment:    Heparin level is still < 0.1 after increase rate from 650 to 800 units/hr early this morning.  CBC trending down some. Coumadin on hold, INR 1.9 x 2 days.  Noted that patient does not wish to undergo endoscopy at this time, and that she would like to go home tomorrow if possible.  Goal of Therapy:  Heparin level 0.3-0.5 units/ml Monitor platelets by anticoagulation protocol: Yes   Plan:   Increase heparin drip to 1000 units/hr.  Next heparin level ~ 6 hrs after increase.  Daily heparin level and CBC while on heparin.  PT/INR in am.  Will follow up for further bleeding.   Arty Baumgartner, [place] Pager: [phone] 05/20/2013,4:45 PM

## 2013-05-20 NOTE — Progress Notes (Signed)
UR completed Telena Peyser K. Marcey Persad, RN, BSN, [place], CCM  05/20/2013 2:44 PM

## 2013-05-21 DIAGNOSIS — E876 Hypokalemia: Secondary | ICD-10-CM

## 2013-05-21 LAB — CBC
HCT: 29.3 % — ABNORMAL LOW (ref 36.0–46.0)
Hemoglobin: 9.4 g/dL — ABNORMAL LOW (ref 12.0–15.0)
MCH: 33.3 pg (ref 26.0–34.0)
MCHC: 32.1 g/dL (ref 30.0–36.0)
MCV: 103.9 fL — ABNORMAL HIGH (ref 78.0–100.0)
Platelets: 167 10*3/uL (ref 150–400)
RBC: 2.82 MIL/uL — ABNORMAL LOW (ref 3.87–5.11)
RDW: 15.8 % — ABNORMAL HIGH (ref 11.5–15.5)
WBC: 4.8 10*3/uL (ref 4.0–10.5)

## 2013-05-21 LAB — PROTIME-INR
INR: 1.42 (ref 0.00–1.49)
PROTHROMBIN TIME: 17 s — AB (ref 11.6–15.2)

## 2013-05-21 LAB — HEPARIN LEVEL (UNFRACTIONATED)
HEPARIN UNFRACTIONATED: 0.28 [IU]/mL — AB (ref 0.30–0.70)
Heparin Unfractionated: 0.4 IU/mL (ref 0.30–0.70)

## 2013-05-21 LAB — FERRITIN: FERRITIN: 80 ng/mL (ref 10–291)

## 2013-05-21 MED ORDER — SODIUM CHLORIDE 0.9 % IV SOLN: 25.0000 mg | Freq: Once | INTRAVENOUS | Status: DC

## 2013-05-21 MED ORDER — ENOXAPARIN SODIUM 60 MG/0.6ML ~~LOC~~ SOLN
55.0000 mg | Freq: Two times a day (BID) | SUBCUTANEOUS | Status: DC
  Administered 2013-05-21 – 2013-05-22 (×3): 55 mg via SUBCUTANEOUS
  Filled 2013-05-21 (×4): qty 0.6

## 2013-05-21 MED ORDER — LORATADINE 10 MG PO TABS
10.0000 mg | ORAL_TABLET | Freq: Every day | ORAL | Status: DC
  Administered 2013-05-21 – 2013-05-22 (×2): 10 mg via ORAL
  Filled 2013-05-21 (×2): qty 1

## 2013-05-21 MED ORDER — SODIUM CHLORIDE 0.9 % IV SOLN: 500.0000 mg | Freq: Once | INTRAVENOUS | Status: DC

## 2013-05-21 MED ORDER — SODIUM CHLORIDE 0.9 % IV SOLN
1020.0000 mg | Freq: Once | INTRAVENOUS | Status: AC
  Administered 2013-05-21: 1020 mg via INTRAVENOUS
  Filled 2013-05-21: qty 34

## 2013-05-21 NOTE — Progress Notes (Addendum)
ANTICOAGULATION CONSULT NOTE - Follow Up Consult  Pharmacy Consult for Heparin (while warfarin on hold) Indication: atrial fibrillation, mechanical AVR  Allergies  Allergen Reactions  . Amiodarone Shortness Of Breath  . Alendronate Sodium     GI upset  . Macrodantin [Nitrofurantoin]     GI upset  . Meclizine Swelling    tongue  . Norvasc [Amlodipine Besylate]     edema  . Tussionex Pennkinetic Er [Hydrocod Polst-Cpm Polst Er] Other (See Comments)    Reaction unknown    Patient Measurements: Height: 5' (152.4 cm) Weight: 122 lb 8 oz (55.566 kg) (scale A) IBW/kg (Calculated) : 45.5 Vital Signs: Temp: 97.9 F (36.6 C) (02/19 2048) Temp src: Oral (02/19 2048) BP: 131/46 mmHg (02/19 2048) Pulse Rate: 82 (02/19 2048)  Labs:  Recent Labs  05/19/13 1204 05/20/13 0100 05/20/13 1400 05/20/13 2250  HGB 10.5* 9.5*  --   --   HCT 32.4* 28.0*  --   --   PLT 157 139*  --   --   LABPROT 21.2* 21.2*  --   --   INR 1.90* 1.90*  --   --   HEPARINUNFRC  --  <0.10* <0.10* 0.40  CREATININE 0.71 0.66  --   --     Estimated Creatinine Clearance: 40.9 ml/min (by C-G formula based on Cr of 0.66).   Medications:  Heparin 1000 units/hr  Assessment: 80 y/o F on heparin while warfarin on hold in setting of melena, GI following, HL is now 0.40, will aim for low end of heparin goal, no issues per RN.   Goal of Therapy:  Heparin level 0.3-0.5 units/ml Monitor platelets by anticoagulation protocol: Yes   Plan:  -Continue heparin drip at 1000 units/hr -F/U HL with AM labs -Daily CBC/HL -Monitor for any further drop in Hgb -F/U GI plan  Narda Bonds 05/21/2013,1:04 AM  Addendum 5:55 AM Repeat HL 0.28 -Will increase heparin drip to 1100 units/hr -1400 HL to confirm

## 2013-05-21 NOTE — Progress Notes (Addendum)
[facility] PROGRESS NOTE Interim History: 80 year old female with multiple medical problems who we are asked to see in regards to melena for the past 11 days with a drop in hemoglobin. She does have a history of atrial fibrillation and has had a St. Jude cardiac valve surgery in the past. She is on chronic Coumadin therapy. Comes in with melena.  Assessment/Plan: Melena: - EGD, colonoscopy, capsule endo with no source in past, Hbg stable - GI consulted rec conservative management. - holding coumadin, pt refused colonoscopy or EGD. - Re-check CBC in am, monitor Hbg. - ? Due to AVM, she does have an aortic metallic valve.  Iron deficiency anemia secondary to blood loss (chronic) - ferritn 80's, start IV iron.   Atrial fibrillation/ S/P aortic valve replacement with metallic valve - rate controlled. - INR 1.4 off coumadin, currently on heparin.    Code Status: full  Family Communication: patient/son  Disposition Plan: admit    Consultants:  GI  Procedures:  none  Antibiotics:  none  HPI/Subjective: Wants to go home.   Objective: Filed Vitals:   05/20/13 0937 05/20/13 1418 05/20/13 2048 05/21/13 0622  BP: 110/50 117/48 131/46 152/62  Pulse: 67 80 82 74  Temp:  97.7 F (36.5 C) 97.9 F (36.6 C) 98.1 F (36.7 C)  TempSrc:  Oral Oral Oral  Resp: 18 18 20 17   Height:      Weight:    55.9 kg (123 lb 3.8 oz)  SpO2: 95% 98% 95% 97%    Intake/Output Summary (Last 24 hours) at 05/21/13 1054 Last data filed at 05/21/13 0900  Gross per 24 hour  Intake    664 ml  Output   1150 ml  Net   -486 ml   Filed Weights   05/20/13 0459 05/20/13 0535 05/21/13 0622  Weight: 55.566 kg (122 lb 8 oz) 55.566 kg (122 lb 8 oz) 55.9 kg (123 lb 3.8 oz)    Exam:  General: Alert, awake, oriented x3, in no acute distress.  HEENT: No bruits, no goiter.  Heart: Regular rate and rhythm, without murmurs, rubs, gallops.  Lungs: Good air movement, clear to auscultation.    Abdomen: Soft, nontender, nondistended, positive bowel sounds.   Data Reviewed: Basic Metabolic Panel:  Recent Labs Lab 05/19/13 1204 05/20/13 0100  NA 137 135*  K 4.3 3.4*  CL 96 95*  CO2 26 28  GLUCOSE 96 111*  BUN 22 22  CREATININE 0.71 0.66  CALCIUM 9.9 9.5   Liver Function Tests:  Recent Labs Lab 05/19/13 1204  AST 34  ALT 26  ALKPHOS 57  BILITOT 0.3  PROT 6.8  ALBUMIN 3.8   No results found for this basename: LIPASE, AMYLASE,  in the last 168 hours No results found for this basename: AMMONIA,  in the last 168 hours CBC:  Recent Labs Lab 05/17/13 1130 05/19/13 1204 05/20/13 0100 05/21/13 0428  WBC 4.6 4.2 4.1 4.8  NEUTROABS 3.2  --   --   --   HGB 10.2* 10.5* 9.5* 9.4*  HCT 31.8* 32.4* 28.0* 29.3*  MCV 101.0 102.9* 102.9* 103.9*  PLT 162 157 139* 167   Cardiac Enzymes: No results found for this basename: CKTOTAL, CKMB, CKMBINDEX, TROPONINI,  in the last 168 hours BNP (last 3 results) No results found for this basename: PROBNP,  in the last 8760 hours CBG: No results found for this basename: GLUCAP,  in the last 168 hours  No results found for this or any previous  visit (from the past 240 hour(s)).   Studies: No results found.  Scheduled Meds: . allopurinol  300 mg Oral Daily  . irbesartan  300 mg Oral Daily   And  . amLODipine  5 mg Oral Daily   And  . hydrochlorothiazide  12.5 mg Oral Daily  . digoxin  0.125 mg Oral Daily  . ferumoxytol  1,020 mg Intravenous Once  . levothyroxine  88 mcg Oral QAC breakfast  . metoprolol tartrate  12.5 mg Oral Daily  . metoprolol tartrate  25 mg Oral QHS  . pantoprazole  40 mg Oral Daily  . prednisoLONE acetate  1 drop Both Eyes QID  . simvastatin  10 mg Oral q1800  . sodium chloride  3 mL Intravenous Q12H   Continuous Infusions:     Charlynne Cousins  [facility] Pager [phone]. If 8PM-8AM, please contact night-coverage at [email or web address], password [facility] 05/21/2013, 10:54 AM  LOS: 2 days

## 2013-05-21 NOTE — Progress Notes (Signed)
Pt states that she will go home on lovenox. Pt states she is unsure whether she will need to give herself lovenox shots or if staff at independent living facility will give her shots. Pt states she has given herself shots before and knows how to give them. Pt educated on how to give lovenox shot this PM. Patient able to give herself shot correctly tonight.

## 2013-05-21 NOTE — Progress Notes (Signed)
ANTICOAGULATION CONSULT NOTE - Follow Up Consult  Pharmacy Consult for Heparin -> Lovenox Indication: atrial fibrillation and St. Jude AVR  Allergies  Allergen Reactions  . Amiodarone Shortness Of Breath  . Alendronate Sodium     GI upset  . Macrodantin [Nitrofurantoin]     GI upset  . Meclizine Swelling    tongue  . Norvasc [Amlodipine Besylate]     edema  . Tussionex Pennkinetic Er [Hydrocod Polst-Cpm Polst Er] Other (See Comments)    Reaction unknown    Patient Measurements: Height: 5' (152.4 cm) Weight: 123 lb 3.8 oz (55.9 kg) (scale A) IBW/kg (Calculated) : 45.5 Heparin Dosing Weight: 55.9 kg  Vital Signs: Temp: 98.1 F (36.7 C) (02/20 0622) Temp src: Oral (02/20 0622) BP: 144/57 mmHg (02/20 1117) Pulse Rate: 80 (02/20 1117)  Labs:  Recent Labs  05/19/13 1204  05/20/13 0100 05/20/13 1400 05/20/13 2250 05/21/13 0428  HGB 10.5*  --  9.5*  --   --  9.4*  HCT 32.4*  --  28.0*  --   --  29.3*  PLT 157  --  139*  --   --  167  LABPROT 21.2*  --  21.2*  --   --  17.0*  INR 1.90*  --  1.90*  --   --  1.42  HEPARINUNFRC  --   < > <0.10* <0.10* 0.40 0.28*  CREATININE 0.71  --  0.66  --   --   --   < > = values in this interval not displayed.  Estimated Creatinine Clearance: 41.1 ml/min (by C-G formula based on Cr of 0.66).  Assessment:    Heparin levels have remained low despite several rate increases. Next level was due at 2pm.  Now to change to Lovenox in preparation for discharge. She reports prior self-administration of Lovenox.  CBC trending down some. Coumadin on hold, INR down to 1.42  Noted that patient does not wish to undergo endoscopy at this time, and that she would like to go home soon.   Feraheme 1020 mg IV x 1 today (last dose 03/19/13).   Goal of Therapy:  Anti-Xa level 0.6-1 units/ml 4hrs after LMWH dose given Monitor platelets by anticoagulation protocol: Yes   Plan:   Heparin drip has been stopped.  Begin Lovenox 55 mg (1 mg/kg) SQ q12hrs.  Will follow-up for resuming Coumadin, possibly tomorrow.  Daily PT/INR.  Will follow up for further bleeding.   Arty Baumgartner, [place] Pager: [phone] 05/21/2013,11:34 AM

## 2013-05-21 NOTE — Progress Notes (Signed)
Patient alert and oriented x4 throughout shift.  Patient had one tarry stool this morning; MD aware.  Patient's daughter and grandson at bedside this evening.  Patient denies any questions or concerns at this time.  Vital signs stable.  Will continue to monitor.

## 2013-05-22 LAB — BASIC METABOLIC PANEL
BUN: 14 mg/dL (ref 6–23)
CO2: 27 mEq/L (ref 19–32)
Calcium: 9.5 mg/dL (ref 8.4–10.5)
Chloride: 96 mEq/L (ref 96–112)
Creatinine, Ser: 0.67 mg/dL (ref 0.50–1.10)
GFR calc non Af Amer: 79 mL/min — ABNORMAL LOW (ref >90)
GLUCOSE: 88 mg/dL (ref 70–99)
Potassium: 4 mEq/L (ref 3.7–5.3)
Sodium: 136 mEq/L — ABNORMAL LOW (ref 137–147)

## 2013-05-22 LAB — CBC
HEMATOCRIT: 28.4 % — AB (ref 36.0–46.0)
Hemoglobin: 9.3 g/dL — ABNORMAL LOW (ref 12.0–15.0)
MCH: 34.1 pg — ABNORMAL HIGH (ref 26.0–34.0)
MCHC: 32.7 g/dL (ref 30.0–36.0)
MCV: 104 fL — AB (ref 78.0–100.0)
PLATELETS: 167 10*3/uL (ref 150–400)
RBC: 2.73 MIL/uL — AB (ref 3.87–5.11)
RDW: 15.8 % — ABNORMAL HIGH (ref 11.5–15.5)
WBC: 4 10*3/uL (ref 4.0–10.5)

## 2013-05-22 LAB — PROTIME-INR
INR: 1.24 (ref 0.00–1.49)
Prothrombin Time: 15.3 seconds — ABNORMAL HIGH (ref 11.6–15.2)

## 2013-05-22 MED ORDER — LORATADINE 10 MG PO TABS: 10.0000 mg | ORAL_TABLET | Freq: Every day | ORAL | Status: DC

## 2013-05-22 MED ORDER — ENOXAPARIN SODIUM 60 MG/0.6ML ~~LOC~~ SOLN: 55.0000 mg | Freq: Two times a day (BID) | SUBCUTANEOUS | Status: DC

## 2013-05-22 MED ORDER — CELECOXIB 200 MG PO CAPS
200.0000 mg | ORAL_CAPSULE | Freq: Every day | ORAL | Status: DC
  Administered 2013-05-22: 200 mg via ORAL
  Filled 2013-05-22: qty 1

## 2013-05-22 MED ORDER — WARFARIN SODIUM 5 MG PO TABS
5.0000 mg | ORAL_TABLET | Freq: Once | ORAL | Status: DC
  Filled 2013-05-22: qty 1

## 2013-05-22 MED ORDER — WARFARIN - PHARMACIST DOSING INPATIENT: Freq: Every day | Status: DC

## 2013-05-22 NOTE — Progress Notes (Signed)
ANTICOAGULATION CONSULT NOTE - Follow Up Consult  Pharmacy Consult for Warfarin  Indication: atrial fibrillation and mechanical AVR (goal 2.5-3.5 per outpatient notes)  Allergies  Allergen Reactions  . Amiodarone Shortness Of Breath  . Alendronate Sodium     GI upset  . Macrodantin [Nitrofurantoin]     GI upset  . Meclizine Swelling    tongue  . Norvasc [Amlodipine Besylate]     edema  . Tussionex Pennkinetic Er [Hydrocod Polst-Cpm Polst Er] Other (See Comments)    Reaction unknown    Patient Measurements: Height: 5' (152.4 cm) Weight: 122 lb 14.4 oz (55.747 kg) (scale A) IBW/kg (Calculated) : 45.5  Vital Signs: Temp: 97.9 F (36.6 C) (02/21 0529) Temp src: Oral (02/21 0529) BP: 139/76 mmHg (02/21 0529) Pulse Rate: 66 (02/21 0529)  Labs:  Recent Labs  05/19/13 1204  05/20/13 0100 05/20/13 1400 05/20/13 2250 05/21/13 0428 05/22/13 0515  HGB 10.5*  --  9.5*  --   --  9.4* 9.3*  HCT 32.4*  --  28.0*  --   --  29.3* 28.4*  PLT 157  --  139*  --   --  167 167  LABPROT 21.2*  --  21.2*  --   --  17.0* 15.3*  INR 1.90*  --  1.90*  --   --  1.42 1.24  HEPARINUNFRC  --   < > <0.10* <0.10* 0.40 0.28*  --   CREATININE 0.71  --  0.66  --   --   --  0.67  < > = values in this interval not displayed.  Estimated Creatinine Clearance: 41 ml/min (by C-G formula based on Cr of 0.67).   Medications:  Warfarin PTA: 2.5mg  MF, 5mg  all other days  Assessment: 80 y/o F who has been on heparin (and now lovenox) while warfarin was held in setting on melena is now restarting warfarin today. INR is 1.24 today, other labs as above.   Goal of Therapy:  INR 2.5-3.5 per outpatient anti-coag notes Monitor platelets by anticoagulation protocol: Yes   Plan:  -Warfarin 5mg  PO x 1 tonight at 1800 -Daily PT/INR -Monitor for bleeding -DC Lovenox when INR >2.5  Narda Bonds 05/22/2013,7:23 AM

## 2013-05-22 NOTE — Discharge Summary (Signed)
Physician Discharge Summary  Deborah [name] ZOX:[id number] DOB: [birth year] DOA: 05/19/2013  PCP: Darlin Coco, MD  Admit date: 05/19/2013 Discharge date: 05/22/2013  Time spent: 35 minutes  Recommendations for Outpatient Follow-up:  1. Follow up with hematologist in 1 week.  Discharge Diagnoses:  Active Problems:   S/P aortic valve replacement with metallic valve   Atrial fibrillation   Dyslipidemia   Iron deficiency anemia secondary to blood loss (chronic)   Melena   Discharge Condition: stable  Diet recommendation: heart healthy  Filed Weights   05/20/13 0535 05/21/13 0622 05/22/13 0529  Weight: 55.566 kg (122 lb 8 oz) 55.9 kg (123 lb 3.8 oz) 55.747 kg (122 lb 14.4 oz)    History of present illness:  80 y.o. female Who follows with Dr. Mare Ferrari. She has a past medical history of anemia, atrial fibrillation and GI bleed who presents to the emergency department after being sent by gastroenterology office to see Dr. Penelope Coop with gastroenterology (usually see Dr. Tami Lin). Patient states she has had increased dark bloody stools over the past 11-12 days. She is on Coumadin has frequent blood tests, she was told she had a 2 point drop in her hemoglobin over the past 10 days, last checked on Monday which was 10.3- baseline 12.Marland Kitchen   Hospital Course:  Melena:  - EGD, colonoscopy, capsule endo with no source in past, Hbg stable  - GI consulted rec conservative management.  - holding coumadin, pt refused colonoscopy or EGD. Cont her on IV heparin. - once melan and BM decrease started on coumadin and lovenox. - Hbg remain stable after initial drop. - ? Due to AVM, she does have an aortic metallic valve.   Iron deficiency anemia secondary to blood loss (chronic)  - ferritn 80's,  - IV iron given.  Atrial fibrillation/ S/P aortic valve replacement with metallic valve  - rate controlled.  - Held coumadin due to melena. Once resolved restarted. - INR follow up on   12.23.2015.   Procedures:  None  Consultations:  GI  Discharge Exam: Filed Vitals:   05/22/13 0529  BP: 139/76  Pulse: 66  Temp: 97.9 F (36.6 C)  Resp:     General: A&O x3 Cardiovascular: RRR Respiratory: good air movement CTA B/L  Discharge Instructions      Discharge Orders   Future Appointments Provider Department Dept Phone   05/24/2013 10:00 AM [facility] [phone]   06/02/2013 10:00 AM [facility] [phone]   06/02/2013 10:15 AM [facility] [phone]   06/15/2013 4:00 PM Darlin Coco, MD [place] [phone]   06/30/2013 10:00 AM [facility] [phone]   07/27/2013 10:00 AM [facility] [phone]   07/27/2013 10:30 AM [name], MD [facility] [phone]   Future Orders Complete By Expires   Diet - low sodium heart healthy  As directed    Increase activity slowly  As directed        Medication List         ALLEGRA ALLERGY 180 MG tablet  Generic drug:  fexofenadine  Take 180 mg by mouth daily. For allergies. As needed     allopurinol 300 MG tablet  Commonly known as:  ZYLOPRIM  Take 300 mg by mouth daily.     BENTYL  10 MG capsule  Generic drug:  dicyclomine  Take 10 mg by mouth as needed. For stomach cramps     calcium-vitamin D 500 MG tablet  Take 1 tablet by mouth 2 (two) times daily.     celecoxib 200 MG capsule  Commonly known as:  CELEBREX  Take 200 mg by mouth daily.     digoxin 0.25 MG tablet  Commonly known as:  LANOXIN  Take 0.125 mg by mouth daily. In the morning     enoxaparin 60 MG/0.6ML injection  Commonly known as:  LOVENOX  Inject 0.55 mLs (55 mg total) into the skin every 12 (twelve) hours.     Fish Oil  1000 MG Caps  Take 1,000 mg by mouth 2 (two) times daily.     furosemide 40 MG tablet  Commonly known as:  LASIX  Take 20-40 mg by mouth daily as needed for fluid.     lansoprazole 30 MG capsule  Commonly known as:  PREVACID  Take 30 mg by mouth daily.     levothyroxine 88 MCG tablet  Commonly known as:  SYNTHROID, LEVOTHROID  Take 88 mcg by mouth daily before breakfast.     metoprolol tartrate 25 MG tablet  Commonly known as:  LOPRESSOR  Take 12.5-25 mg by mouth 2 (two) times daily. Take 0.5 tablet in AM and 1 tablet in PM     multivitamin per tablet  Take 1 tablet by mouth daily.     nitroGLYCERIN 0.4 MG SL tablet  Commonly known as:  NITROSTAT  Place 0.4 mg under the tongue every 5 (five) minutes as needed for chest pain.     OPCON-A 0.027-0.315 % Soln  Generic drug:  Naphazoline-Pheniramine  Apply 1 drop to eye 2 (two) times daily as needed (itchy eyes).     potassium chloride SA 20 MEQ tablet  Commonly known as:  K-DUR,KLOR-CON  Take 20 mEq by mouth daily.     prednisoLONE acetate 1 % ophthalmic suspension  Commonly known as:  PRED FORTE  Place 1 drop into both eyes 4 (four) times daily.     simvastatin 20 MG tablet  Commonly known as:  ZOCOR  Take 10 mg by mouth daily.     TRIBENZOR 40-5-12.5 MG Tabs  Generic drug:  Olmesartan-Amlodipine-HCTZ  Take 1 tablet by mouth daily.     TRIBENZOR 40-5-12.5 MG Tabs  Generic drug:  Olmesartan-Amlodipine-HCTZ  Take 1 tablet by mouth every morning.     warfarin 5 MG tablet  Commonly known as:  COUMADIN  Take 2.5-5 mg by mouth daily. Take 2.5mg  on Mon and Fri. Take 5mg  all other days     zolpidem 12.5 MG CR tablet  Commonly known as:  AMBIEN CR  Take 12.5 mg by mouth at bedtime as needed for sleep.       Allergies  Allergen Reactions  . Amiodarone Shortness Of Breath  . Alendronate Sodium     GI upset  . Macrodantin [Nitrofurantoin]     GI upset  . Meclizine Swelling    tongue  . Norvasc [Amlodipine  Besylate]     edema  . Tussionex Pennkinetic Er [Hydrocod Polst-Cpm Polst Er] Other (See Comments)    Reaction unknown   Follow-up Information   Follow up with [facility], MD In 2 weeks. (hospital follow up)    Specialty:  Gastroenterology   Contact information:   [address] [place] [address] [phone]       Follow  up with [name], MD In 1 week. (hospital follow up)    Specialty:  Oncology   Contact information:   [place] [address] [phone]        The results of significant diagnostics from this hospitalization (including imaging, microbiology, ancillary and laboratory) are listed below for reference.    Significant Diagnostic Studies: No results found.  Microbiology: No results found for this or any previous visit (from the past 240 hour(s)).   Labs: Basic Metabolic Panel:  Recent Labs Lab 05/19/13 1204 05/20/13 0100 05/22/13 0515  NA 137 135* 136*  K 4.3 3.4* 4.0  CL 96 95* 96  CO2 26 28 27   GLUCOSE 96 111* 88  BUN 22 22 14   CREATININE 0.71 0.66 0.67  CALCIUM 9.9 9.5 9.5   Liver Function Tests:  Recent Labs Lab 05/19/13 1204  AST 34  ALT 26  ALKPHOS 57  BILITOT 0.3  PROT 6.8  ALBUMIN 3.8   No results found for this basename: LIPASE, AMYLASE,  in the last 168 hours No results found for this basename: AMMONIA,  in the last 168 hours CBC:  Recent Labs Lab 05/17/13 1130 05/19/13 1204 05/20/13 0100 05/21/13 0428 05/22/13 0515  WBC 4.6 4.2 4.1 4.8 4.0  NEUTROABS 3.2  --   --   --   --   HGB 10.2* 10.5* 9.5* 9.4* 9.3*  HCT 31.8* 32.4* 28.0* 29.3* 28.4*  MCV 101.0 102.9* 102.9* 103.9* 104.0*  PLT 162 157 139* 167 167   Cardiac Enzymes: No results found for this basename: CKTOTAL, CKMB, CKMBINDEX, TROPONINI,  in the last 168 hours BNP: BNP (last 3 results) No results found for this basename: PROBNP,  in the last 8760 hours CBG: No results found for this basename: GLUCAP,  in  the last 168 hours     Signed:  Charlynne Cousins  [facility] 05/22/2013, 8:38 AM

## 2013-05-22 NOTE — Progress Notes (Signed)
I cosign with Talmadge Chad, Student RN on all assessments, medication administration, and documentation for this shift. Ronnette Hila, RN

## 2013-05-24 ENCOUNTER — Other Ambulatory Visit (HOSPITAL_BASED_OUTPATIENT_CLINIC_OR_DEPARTMENT_OTHER): Payer: Medicare Other

## 2013-05-24 ENCOUNTER — Telehealth: Payer: Self-pay | Admitting: *Deleted

## 2013-05-24 DIAGNOSIS — D689 Coagulation defect, unspecified: Secondary | ICD-10-CM

## 2013-05-24 DIAGNOSIS — Z954 Presence of other heart-valve replacement: Secondary | ICD-10-CM

## 2013-05-24 DIAGNOSIS — Z7901 Long term (current) use of anticoagulants: Secondary | ICD-10-CM

## 2013-05-24 DIAGNOSIS — I4891 Unspecified atrial fibrillation: Secondary | ICD-10-CM

## 2013-05-24 DIAGNOSIS — D500 Iron deficiency anemia secondary to blood loss (chronic): Secondary | ICD-10-CM

## 2013-05-24 LAB — CBC WITH DIFFERENTIAL/PLATELET
BASO%: 0.6 % (ref 0.0–2.0)
BASOS ABS: 0 10*3/uL (ref 0.0–0.1)
EOS%: 1.7 % (ref 0.0–7.0)
Eosinophils Absolute: 0.1 10*3/uL (ref 0.0–0.5)
HCT: 29.3 % — ABNORMAL LOW (ref 34.8–46.6)
HGB: 9.8 g/dL — ABNORMAL LOW (ref 11.6–15.9)
LYMPH#: 0.6 10*3/uL — AB (ref 0.9–3.3)
LYMPH%: 13.8 % — ABNORMAL LOW (ref 14.0–49.7)
MCH: 34.9 pg — ABNORMAL HIGH (ref 25.1–34.0)
MCHC: 33.4 g/dL (ref 31.5–36.0)
MCV: 104.5 fL — AB (ref 79.5–101.0)
MONO#: 0.4 10*3/uL (ref 0.1–0.9)
MONO%: 8.9 % (ref 0.0–14.0)
NEUT%: 75 % (ref 38.4–76.8)
NEUTROS ABS: 3.1 10*3/uL (ref 1.5–6.5)
Platelets: 147 10*3/uL (ref 145–400)
RBC: 2.81 10*6/uL — ABNORMAL LOW (ref 3.70–5.45)
RDW: 16.4 % — AB (ref 11.2–14.5)
WBC: 4.1 10*3/uL (ref 3.9–10.3)

## 2013-05-24 LAB — PROTIME-INR
INR: 1.3 — AB (ref 2.00–3.50)
Protime: 15.6 Seconds — ABNORMAL HIGH (ref 10.6–13.4)

## 2013-05-24 LAB — FERRITIN CHCC: FERRITIN: 514 ng/mL — AB (ref 9–269)

## 2013-05-24 NOTE — Telephone Encounter (Signed)
sw pt gv appt for labs@ 10:15am on 05/26/13 and cou @ 10:30am. Pt is aware...td

## 2013-05-24 NOTE — Telephone Encounter (Signed)
Patient in office today to have labs checked after being stopped and started on Coumadin with recent hospital visit. Patient is on Lovenox bid dosing bridge for now, per Dr Jana Hakim, should return to our office on Wednesday 2/25 for labs and Coumadin Clinic visit. Spoke with [place], she will order labs and appt for 1015. Called and left message at patient home with date and time with the caregiver.

## 2013-05-26 ENCOUNTER — Ambulatory Visit (HOSPITAL_BASED_OUTPATIENT_CLINIC_OR_DEPARTMENT_OTHER): Payer: Medicare Other | Admitting: Pharmacist

## 2013-05-26 ENCOUNTER — Other Ambulatory Visit (HOSPITAL_BASED_OUTPATIENT_CLINIC_OR_DEPARTMENT_OTHER): Payer: Medicare Other

## 2013-05-26 DIAGNOSIS — Z7901 Long term (current) use of anticoagulants: Secondary | ICD-10-CM

## 2013-05-26 DIAGNOSIS — I359 Nonrheumatic aortic valve disorder, unspecified: Secondary | ICD-10-CM

## 2013-05-26 DIAGNOSIS — D689 Coagulation defect, unspecified: Secondary | ICD-10-CM

## 2013-05-26 DIAGNOSIS — Z954 Presence of other heart-valve replacement: Secondary | ICD-10-CM

## 2013-05-26 DIAGNOSIS — I4891 Unspecified atrial fibrillation: Secondary | ICD-10-CM

## 2013-05-26 LAB — PROTIME-INR
INR: 1.3 — ABNORMAL LOW (ref 2.00–3.50)
PROTIME: 15.6 s — AB (ref 10.6–13.4)

## 2013-05-26 LAB — CBC WITH DIFFERENTIAL/PLATELET
BASO%: 0.5 % (ref 0.0–2.0)
Basophils Absolute: 0 10*3/uL (ref 0.0–0.1)
EOS%: 1.1 % (ref 0.0–7.0)
Eosinophils Absolute: 0.1 10*3/uL (ref 0.0–0.5)
HCT: 32.5 % — ABNORMAL LOW (ref 34.8–46.6)
HGB: 9.5 g/dL — ABNORMAL LOW (ref 11.6–15.9)
LYMPH%: 14.8 % (ref 14.0–49.7)
MCH: 34.4 pg — ABNORMAL HIGH (ref 25.1–34.0)
MCHC: 29.2 g/dL — AB (ref 31.5–36.0)
MCV: 117.8 fL — ABNORMAL HIGH (ref 79.5–101.0)
MONO#: 0.4 10*3/uL (ref 0.1–0.9)
MONO%: 9.1 % (ref 0.0–14.0)
NEUT%: 74.5 % (ref 38.4–76.8)
NEUTROS ABS: 3.3 10*3/uL (ref 1.5–6.5)
Platelets: 150 10*3/uL (ref 145–400)
RBC: 2.76 10*6/uL — AB (ref 3.70–5.45)
RDW: 17.1 % — AB (ref 11.2–14.5)
WBC: 4.4 10*3/uL (ref 3.9–10.3)
lymph#: 0.7 10*3/uL — ABNORMAL LOW (ref 0.9–3.3)
nRBC: 0 % (ref 0–0)

## 2013-05-26 LAB — POCT INR: INR: 1.3

## 2013-05-26 NOTE — Patient Instructions (Addendum)
INR below goal Continue lovenox twice daily Today (Wed) and Thursday take 1.5 tablets (7.5 mg) daily Recheck PT/INR on Friday 05/28/13 at 2pm for lab and 2:15pm for coumadin clinic

## 2013-05-26 NOTE — Progress Notes (Signed)
INR below goal today Pt is doing well and is feeling better after being in the hospital last week with blood in her stools she was off coumadin for a few days and had a heparin drip running.  She was converted to lovenox and restarted on coumadin on Saturday 05/22/13 She continues Lovenox 60 mg twice daily She has had 4 days of overlap and her INR remains low (may start to rise over the next few days as she has been stable on current dose) Pt reports no more blood in her stools or urine No unusual bleeding or bruising (except for lovenox injection sites - minor bruising) Her Hgb continues to be low but stable at 9.5 today No other medication changes or diet changes Plan: Continue lovenox twice daily (pt may run out of lovenox shots prior to visit - I provided her with samples since the weather may turn bad over the next few days, in case Ms. Sarver cannot make her appointment or get to her pharmacy) Today (Wed) and Thursday take 1.5 tablets (7.5 mg) daily Recheck PT/INR on Friday 05/28/13 at 2pm for lab and 2:15pm for coumadin clinic

## 2013-05-28 ENCOUNTER — Other Ambulatory Visit (HOSPITAL_BASED_OUTPATIENT_CLINIC_OR_DEPARTMENT_OTHER): Payer: Medicare Other

## 2013-05-28 ENCOUNTER — Ambulatory Visit (HOSPITAL_BASED_OUTPATIENT_CLINIC_OR_DEPARTMENT_OTHER): Payer: Medicare Other | Admitting: Pharmacist

## 2013-05-28 DIAGNOSIS — Z7901 Long term (current) use of anticoagulants: Secondary | ICD-10-CM

## 2013-05-28 DIAGNOSIS — I359 Nonrheumatic aortic valve disorder, unspecified: Secondary | ICD-10-CM

## 2013-05-28 DIAGNOSIS — Z954 Presence of other heart-valve replacement: Secondary | ICD-10-CM

## 2013-05-28 DIAGNOSIS — D689 Coagulation defect, unspecified: Secondary | ICD-10-CM

## 2013-05-28 DIAGNOSIS — I4891 Unspecified atrial fibrillation: Secondary | ICD-10-CM

## 2013-05-28 LAB — CBC WITH DIFFERENTIAL/PLATELET
BASO%: 0.4 % (ref 0.0–2.0)
Basophils Absolute: 0 10*3/uL (ref 0.0–0.1)
EOS%: 1.1 % (ref 0.0–7.0)
Eosinophils Absolute: 0.1 10*3/uL (ref 0.0–0.5)
HEMATOCRIT: 30.4 % — AB (ref 34.8–46.6)
HGB: 9.8 g/dL — ABNORMAL LOW (ref 11.6–15.9)
LYMPH#: 0.9 10*3/uL (ref 0.9–3.3)
LYMPH%: 18.9 % (ref 14.0–49.7)
MCH: 34.3 pg — AB (ref 25.1–34.0)
MCHC: 32.2 g/dL (ref 31.5–36.0)
MCV: 106.3 fL — ABNORMAL HIGH (ref 79.5–101.0)
MONO#: 0.4 10*3/uL (ref 0.1–0.9)
MONO%: 8 % (ref 0.0–14.0)
NEUT#: 3.2 10*3/uL (ref 1.5–6.5)
NEUT%: 71.6 % (ref 38.4–76.8)
Platelets: 174 10*3/uL (ref 145–400)
RBC: 2.86 10*6/uL — ABNORMAL LOW (ref 3.70–5.45)
RDW: 16.6 % — AB (ref 11.2–14.5)
WBC: 4.5 10*3/uL (ref 3.9–10.3)

## 2013-05-28 LAB — PROTIME-INR
INR: 2.1 (ref 2.00–3.50)
Protime: 25.2 Seconds — ABNORMAL HIGH (ref 10.6–13.4)

## 2013-05-28 LAB — POCT INR: INR: 2.1

## 2013-05-28 NOTE — Progress Notes (Signed)
INR = 2.1 Hgb = 9.8 g/dL today Pt took Coumadin 7.5 mg on Wed & Thurs as directed & INR has risen nicely (up from 1.3 on Wed). Pt remains on Lovenox 60 mg sq q12hrs & she has enough to get her through Monday 3/2. Pt reports she had a dark-colored stool today; hoping it isn't blood. Bruising in abdomen from Lovenox inj. Pt is not eating salads currently; she really wants her INR to get back in goal quickly. INR nearing goal.  I will have pt take Coumadin 5 mg today then get back on her previous dose of 5 mg daily; 2.5 mg on Mon/Fri. Repeat protime & CBC on 06/01/13.  If INR >2.5 on Tues, then we can stop her Lovenox. Kennith Center, Pharm.D., CPP 05/28/2013@2 :33 PM

## 2013-06-01 ENCOUNTER — Other Ambulatory Visit: Payer: Self-pay | Admitting: *Deleted

## 2013-06-01 ENCOUNTER — Ambulatory Visit (HOSPITAL_BASED_OUTPATIENT_CLINIC_OR_DEPARTMENT_OTHER): Payer: Medicare Other | Admitting: Pharmacist

## 2013-06-01 ENCOUNTER — Other Ambulatory Visit (HOSPITAL_BASED_OUTPATIENT_CLINIC_OR_DEPARTMENT_OTHER): Payer: Medicare Other

## 2013-06-01 ENCOUNTER — Telehealth: Payer: Self-pay | Admitting: Oncology

## 2013-06-01 DIAGNOSIS — Z7901 Long term (current) use of anticoagulants: Secondary | ICD-10-CM

## 2013-06-01 DIAGNOSIS — D689 Coagulation defect, unspecified: Secondary | ICD-10-CM

## 2013-06-01 DIAGNOSIS — D649 Anemia, unspecified: Secondary | ICD-10-CM

## 2013-06-01 DIAGNOSIS — I4891 Unspecified atrial fibrillation: Secondary | ICD-10-CM

## 2013-06-01 DIAGNOSIS — I359 Nonrheumatic aortic valve disorder, unspecified: Secondary | ICD-10-CM

## 2013-06-01 DIAGNOSIS — Z954 Presence of other heart-valve replacement: Secondary | ICD-10-CM

## 2013-06-01 LAB — CBC & DIFF AND RETIC
BASO%: 0.4 % (ref 0.0–2.0)
BASOS ABS: 0 10*3/uL (ref 0.0–0.1)
EOS ABS: 0 10*3/uL (ref 0.0–0.5)
EOS%: 0.6 % (ref 0.0–7.0)
HCT: 25.5 % — ABNORMAL LOW (ref 34.8–46.6)
HEMOGLOBIN: 8.3 g/dL — AB (ref 11.6–15.9)
IMMATURE RETIC FRACT: 16.5 % — AB (ref 1.60–10.00)
LYMPH#: 0.9 10*3/uL (ref 0.9–3.3)
LYMPH%: 17.9 % (ref 14.0–49.7)
MCH: 34.4 pg — ABNORMAL HIGH (ref 25.1–34.0)
MCHC: 32.5 g/dL (ref 31.5–36.0)
MCV: 105.8 fL — ABNORMAL HIGH (ref 79.5–101.0)
MONO#: 0.2 10*3/uL (ref 0.1–0.9)
MONO%: 4.8 % (ref 0.0–14.0)
NEUT%: 76.3 % (ref 38.4–76.8)
NEUTROS ABS: 3.8 10*3/uL (ref 1.5–6.5)
Platelets: 181 10*3/uL (ref 145–400)
RBC: 2.41 10*6/uL — ABNORMAL LOW (ref 3.70–5.45)
RDW: 17.5 % — AB (ref 11.2–14.5)
RETIC %: 12.41 % — AB (ref 0.70–2.10)
RETIC CT ABS: 299.08 10*3/uL — AB (ref 33.70–90.70)
WBC: 5 10*3/uL (ref 3.9–10.3)
nRBC: 0 % (ref 0–0)

## 2013-06-01 LAB — POCT INR: INR: 2.6

## 2013-06-01 LAB — PROTIME-INR
INR: 2.6 (ref 2.00–3.50)
Protime: 31.2 Seconds — ABNORMAL HIGH (ref 10.6–13.4)

## 2013-06-01 NOTE — Telephone Encounter (Signed)
, °

## 2013-06-01 NOTE — Progress Notes (Signed)
INR within goal today. Hg/Hct: 8.3/25.5, Pltc = 181 today. Hg decreased from Hg = 9.8 on 05/28/13. Pt took coumadin as instructed at last visit. Bruising on abdomen from Lovenox. Few other bruises on arm and legs. Pt states that she is having black stools again. Spoke with Dr. Jana Hakim regarding patient's black stools and dropping hemoglobin. Dr. Jana Hakim would like for pt to receive a blood transfusion this week. Val will call patient to set up date/time. No missed coumadin doses. No changes in diet. Pt not eating salads currently. Ok to stop Lovenox. Continue Coumadin 5mg  daily except 2.5mg  on Mon&Fri. Recheck PT/INR on Tuesday 06/08/13 at 11am for lab and 11:15am for coumadin clinic.

## 2013-06-01 NOTE — Patient Instructions (Signed)
Stop Lovenox. Continue Coumadin 5mg  daily except 2.5mg  on Mon&Fri. Recheck PT/INR on Tuesday 06/08/13 at 11am for lab and 11:15am for coumadin clinic.  Val will call you to set up appointments for a blood transfusion later this week.

## 2013-06-02 ENCOUNTER — Ambulatory Visit: Payer: Medicare Other

## 2013-06-02 ENCOUNTER — Telehealth: Payer: Self-pay | Admitting: Cardiology

## 2013-06-02 ENCOUNTER — Other Ambulatory Visit: Payer: Medicare Other

## 2013-06-02 ENCOUNTER — Telehealth: Payer: Self-pay | Admitting: Oncology

## 2013-06-02 ENCOUNTER — Telehealth: Payer: Self-pay | Admitting: *Deleted

## 2013-06-02 NOTE — Telephone Encounter (Signed)
New message     Pt requested to talk to [place] only

## 2013-06-02 NOTE — Telephone Encounter (Signed)
Per staff message and POF I have scheduled appts.  JMW  

## 2013-06-02 NOTE — Telephone Encounter (Signed)
Patient concerned about having blood transfusion with Coumadin. Discussed with Sharion Dove D and ok to continue same dose and have INR Monday after transfusion Friday. Advised patient

## 2013-06-03 ENCOUNTER — Other Ambulatory Visit: Payer: Self-pay | Admitting: *Deleted

## 2013-06-03 DIAGNOSIS — D500 Iron deficiency anemia secondary to blood loss (chronic): Secondary | ICD-10-CM

## 2013-06-04 ENCOUNTER — Ambulatory Visit (HOSPITAL_BASED_OUTPATIENT_CLINIC_OR_DEPARTMENT_OTHER): Payer: Medicare Other

## 2013-06-04 ENCOUNTER — Other Ambulatory Visit (HOSPITAL_BASED_OUTPATIENT_CLINIC_OR_DEPARTMENT_OTHER): Payer: Medicare Other

## 2013-06-04 ENCOUNTER — Ambulatory Visit (HOSPITAL_COMMUNITY)
Admission: RE | Admit: 2013-06-04 | Discharge: 2013-06-04 | Disposition: A | Discharge status: Home / Self Care | Payer: Medicare Other | Source: Ambulatory Visit | Attending: Oncology | Admitting: Oncology

## 2013-06-04 VITALS — BP 175/81 | HR 69 | Temp 97.7°F | Resp 18

## 2013-06-04 DIAGNOSIS — D500 Iron deficiency anemia secondary to blood loss (chronic): Secondary | ICD-10-CM

## 2013-06-04 DIAGNOSIS — D649 Anemia, unspecified: Secondary | ICD-10-CM

## 2013-06-04 LAB — CBC WITH DIFFERENTIAL/PLATELET
BASO%: 0.3 % (ref 0.0–2.0)
Basophils Absolute: 0 10*3/uL (ref 0.0–0.1)
EOS ABS: 0.1 10*3/uL (ref 0.0–0.5)
EOS%: 1.6 % (ref 0.0–7.0)
HCT: 24.2 % — ABNORMAL LOW (ref 34.8–46.6)
HGB: 7.6 g/dL — ABNORMAL LOW (ref 11.6–15.9)
LYMPH%: 12.5 % — AB (ref 14.0–49.7)
MCH: 34.9 pg — ABNORMAL HIGH (ref 25.1–34.0)
MCHC: 31.4 g/dL — AB (ref 31.5–36.0)
MCV: 111 fL — ABNORMAL HIGH (ref 79.5–101.0)
MONO#: 0.2 10*3/uL (ref 0.1–0.9)
MONO%: 6.5 % (ref 0.0–14.0)
NEUT%: 79.1 % — ABNORMAL HIGH (ref 38.4–76.8)
NEUTROS ABS: 2.9 10*3/uL (ref 1.5–6.5)
NRBC: 1 % — AB (ref 0–0)
Platelets: 150 10*3/uL (ref 145–400)
RBC: 2.18 10*6/uL — ABNORMAL LOW (ref 3.70–5.45)
RDW: 18.5 % — AB (ref 11.2–14.5)
WBC: 3.7 10*3/uL — AB (ref 3.9–10.3)
lymph#: 0.5 10*3/uL — ABNORMAL LOW (ref 0.9–3.3)

## 2013-06-04 LAB — PREPARE RBC (CROSSMATCH)

## 2013-06-04 LAB — HOLD TUBE, BLOOD BANK

## 2013-06-04 MED ORDER — DIPHENHYDRAMINE HCL 25 MG PO CAPS
ORAL_CAPSULE | ORAL | Status: AC
  Filled 2013-06-04: qty 1

## 2013-06-04 MED ORDER — ACETAMINOPHEN 325 MG PO TABS
ORAL_TABLET | ORAL | Status: AC
  Filled 2013-06-04: qty 2

## 2013-06-04 MED ORDER — DIPHENHYDRAMINE HCL 25 MG PO CAPS
25.0000 mg | ORAL_CAPSULE | Freq: Once | ORAL | Status: AC
  Administered 2013-06-04: 25 mg via ORAL

## 2013-06-04 MED ORDER — ACETAMINOPHEN 325 MG PO TABS
650.0000 mg | ORAL_TABLET | Freq: Once | ORAL | Status: AC
  Administered 2013-06-04: 650 mg via ORAL

## 2013-06-04 NOTE — Patient Instructions (Signed)
Blood Transfusion  A blood transfusion replaces your blood or some of its parts. Blood is replaced when you have lost blood because of surgery, an accident, or for severe blood conditions like anemia. You can donate blood to be used on yourself if you have a planned surgery. If you lose blood during that surgery, your own blood can be given back to you. Any blood given to you is checked to make sure it matches your blood type. Your temperature, blood pressure, and heart rate (vital signs) will be checked often.  GET HELP RIGHT AWAY IF:   You feel sick to your stomach (nauseous) or throw up (vomit).  You have watery poop (diarrhea).  You have shortness of breath or trouble breathing.  You have blood in your pee (urine) or have dark colored pee.  You have chest pain or tightness.  Your eyes or skin turn yellow (jaundice).  You have a temperature by mouth above 102 F (38.9 C), not controlled by medicine.  You start to shake and have chills.  You develop a a red rash (hives) or feel itchy.  You develop lightheadedness or feel confused.  You develop back, joint, or muscle pain.  You do not feel hungry (lost appetite).  You feel tired, restless, or nervous.  You develop belly (abdominal) cramps. Document Released: 06/14/2008 Document Revised: 06/10/2011 Document Reviewed: 06/14/2008 [facility] Patient Information 2014 [place], [place].   It was my pleasure to take care of you today!  Thank you, Leeanne Rio, RN

## 2013-06-05 LAB — TYPE AND SCREEN
ABO/RH(D): O POS
ANTIBODY SCREEN: NEGATIVE
UNIT DIVISION: 0
Unit division: 0

## 2013-06-08 ENCOUNTER — Ambulatory Visit (HOSPITAL_BASED_OUTPATIENT_CLINIC_OR_DEPARTMENT_OTHER): Payer: Medicare Other | Admitting: Pharmacist

## 2013-06-08 ENCOUNTER — Other Ambulatory Visit (HOSPITAL_BASED_OUTPATIENT_CLINIC_OR_DEPARTMENT_OTHER): Payer: Medicare Other

## 2013-06-08 DIAGNOSIS — I359 Nonrheumatic aortic valve disorder, unspecified: Secondary | ICD-10-CM

## 2013-06-08 DIAGNOSIS — Z954 Presence of other heart-valve replacement: Secondary | ICD-10-CM

## 2013-06-08 DIAGNOSIS — Z7901 Long term (current) use of anticoagulants: Secondary | ICD-10-CM

## 2013-06-08 DIAGNOSIS — D689 Coagulation defect, unspecified: Secondary | ICD-10-CM

## 2013-06-08 DIAGNOSIS — I4891 Unspecified atrial fibrillation: Secondary | ICD-10-CM

## 2013-06-08 LAB — CBC WITH DIFFERENTIAL/PLATELET
BASO%: 0.5 % (ref 0.0–2.0)
BASOS ABS: 0 10*3/uL (ref 0.0–0.1)
EOS%: 1.2 % (ref 0.0–7.0)
Eosinophils Absolute: 0.1 10*3/uL (ref 0.0–0.5)
HEMATOCRIT: 34.5 % — AB (ref 34.8–46.6)
HEMOGLOBIN: 11.2 g/dL — AB (ref 11.6–15.9)
LYMPH#: 0.6 10*3/uL — AB (ref 0.9–3.3)
LYMPH%: 13.8 % — ABNORMAL LOW (ref 14.0–49.7)
MCH: 33.3 pg (ref 25.1–34.0)
MCHC: 32.5 g/dL (ref 31.5–36.0)
MCV: 102.7 fL — AB (ref 79.5–101.0)
MONO#: 0.3 10*3/uL (ref 0.1–0.9)
MONO%: 8.1 % (ref 0.0–14.0)
NEUT#: 3.1 10*3/uL (ref 1.5–6.5)
NEUT%: 76.4 % (ref 38.4–76.8)
Platelets: 212 10*3/uL (ref 145–400)
RBC: 3.36 10*6/uL — ABNORMAL LOW (ref 3.70–5.45)
RDW: 19.6 % — ABNORMAL HIGH (ref 11.2–14.5)
WBC: 4.1 10*3/uL (ref 3.9–10.3)
nRBC: 0 % (ref 0–0)

## 2013-06-08 LAB — PROTIME-INR
INR: 1.8 — AB (ref 2.00–3.50)
Protime: 21.6 Seconds — ABNORMAL HIGH (ref 10.6–13.4)

## 2013-06-08 LAB — POCT INR: INR: 1.8

## 2013-06-08 NOTE — Progress Notes (Signed)
INR below goal of 2.5-3.5.  Hgb=11.2 after receiving PRBC on 3/6.  No changes in medications.  No bleeding to report.  Deborah [name] stated that her INR has decreased in the past after receiving PRBC.    Will increase coumadin dose by about 20% to 5mg  daily.  Deborah [name] will take 7.5mg  today.  Per discussion with Dr. Jana Hakim, will not start Lovenox today.  Will check PT/INR in 1 week.

## 2013-06-09 NOTE — Progress Notes (Signed)
Quick Note:  Please report to patient. The recent labs are stable. Continue same medication and careful diet. Hemoglobin has improved to 11.2. ______

## 2013-06-14 ENCOUNTER — Other Ambulatory Visit (HOSPITAL_COMMUNITY): Payer: Medicare Other

## 2013-06-15 ENCOUNTER — Ambulatory Visit (INDEPENDENT_AMBULATORY_CARE_PROVIDER_SITE_OTHER): Payer: Medicare Other | Admitting: Cardiology

## 2013-06-15 ENCOUNTER — Ambulatory Visit (HOSPITAL_BASED_OUTPATIENT_CLINIC_OR_DEPARTMENT_OTHER): Payer: Medicare Other | Admitting: Pharmacist

## 2013-06-15 ENCOUNTER — Other Ambulatory Visit (HOSPITAL_BASED_OUTPATIENT_CLINIC_OR_DEPARTMENT_OTHER): Payer: Medicare Other

## 2013-06-15 ENCOUNTER — Encounter: Payer: Self-pay | Admitting: Cardiology

## 2013-06-15 VITALS — BP 144/88 | HR 81 | Ht 60.0 in | Wt 127.0 lb

## 2013-06-15 DIAGNOSIS — Z954 Presence of other heart-valve replacement: Secondary | ICD-10-CM

## 2013-06-15 DIAGNOSIS — I472 Ventricular tachycardia: Secondary | ICD-10-CM

## 2013-06-15 DIAGNOSIS — I359 Nonrheumatic aortic valve disorder, unspecified: Secondary | ICD-10-CM

## 2013-06-15 DIAGNOSIS — I4891 Unspecified atrial fibrillation: Secondary | ICD-10-CM

## 2013-06-15 DIAGNOSIS — D689 Coagulation defect, unspecified: Secondary | ICD-10-CM

## 2013-06-15 DIAGNOSIS — Z7901 Long term (current) use of anticoagulants: Secondary | ICD-10-CM

## 2013-06-15 DIAGNOSIS — I4729 Other ventricular tachycardia: Secondary | ICD-10-CM

## 2013-06-15 DIAGNOSIS — E785 Hyperlipidemia, unspecified: Secondary | ICD-10-CM

## 2013-06-15 DIAGNOSIS — D649 Anemia, unspecified: Secondary | ICD-10-CM

## 2013-06-15 DIAGNOSIS — I119 Hypertensive heart disease without heart failure: Secondary | ICD-10-CM

## 2013-06-15 DIAGNOSIS — E039 Hypothyroidism, unspecified: Secondary | ICD-10-CM

## 2013-06-15 LAB — CBC & DIFF AND RETIC
BASO%: 0.4 % (ref 0.0–2.0)
BASOS ABS: 0 10*3/uL (ref 0.0–0.1)
EOS ABS: 0 10*3/uL (ref 0.0–0.5)
EOS%: 0.8 % (ref 0.0–7.0)
HCT: 36.6 % (ref 34.8–46.6)
HEMOGLOBIN: 12 g/dL (ref 11.6–15.9)
Immature Retic Fract: 8.5 % (ref 1.60–10.00)
LYMPH%: 12 % — AB (ref 14.0–49.7)
MCH: 33.5 pg (ref 25.1–34.0)
MCHC: 32.8 g/dL (ref 31.5–36.0)
MCV: 102.2 fL — AB (ref 79.5–101.0)
MONO#: 0.4 10*3/uL (ref 0.1–0.9)
MONO%: 8.6 % (ref 0.0–14.0)
NEUT%: 78.2 % — AB (ref 38.4–76.8)
NEUTROS ABS: 3.9 10*3/uL (ref 1.5–6.5)
PLATELETS: 198 10*3/uL (ref 145–400)
RBC: 3.58 10*6/uL — ABNORMAL LOW (ref 3.70–5.45)
RDW: 16.9 % — ABNORMAL HIGH (ref 11.2–14.5)
Retic %: 3.31 % — ABNORMAL HIGH (ref 0.70–2.10)
Retic Ct Abs: 118.5 10*3/uL — ABNORMAL HIGH (ref 33.70–90.70)
WBC: 5 10*3/uL (ref 3.9–10.3)
lymph#: 0.6 10*3/uL — ABNORMAL LOW (ref 0.9–3.3)

## 2013-06-15 LAB — PROTIME-INR
INR: 2.6 (ref 2.00–3.50)
Protime: 31.2 Seconds — ABNORMAL HIGH (ref 10.6–13.4)

## 2013-06-15 LAB — POCT INR: INR: 2.6

## 2013-06-15 NOTE — Assessment & Plan Note (Signed)
The patient has a history of hypothyroidism and is on replacement therapy.  At her next venipuncture we will plan to get updated thyroid function studies as well as lipid panel hepatic function panel and basal metabolic panel.  She is on low-dose simvastatin

## 2013-06-15 NOTE — Assessment & Plan Note (Signed)
The patient continues to do very well with her aortic valve prosthetic valve.  She is not having any symptoms of CHF

## 2013-06-15 NOTE — Patient Instructions (Signed)
INR at goal No changes Continue Coumadin 5mg . Recheck PT/INR on Wednesday 06/30/13 at 10am for lab and 10:15am for coumadin clinic.

## 2013-06-15 NOTE — Progress Notes (Signed)
[name] Date of Birth:  1926/11/07 [address] [place] [address] [place], [place]  [address] [phone]         Fax   [phone]  History of Present Illness: This pleasant 80 year old woman is seen back for a scheduled followup office visit. . She has a history of previous mechanical aortic valve replacement in 1996 and she has a history of chronic atrial fibrillation and is on Coumadin. She does not have any ischemic heart disease and she had a normal nuclear stress test in March 2012. She has not been experiencing any new cardiac symptoms. She does have a past history of high blood pressure. The patient has had significant GI problems and is followed closely by Dr. Watt Climes. She is on iron and her hemoglobin has been improving slowly and she has not been aware of any recent hematochezia.  Her stools are chronically dark. Since last visit her energy level has improved slightly and she has had no new cardiac symptoms.  She has a past history of iron deficiency anemia and is seeing Dr. Griffith Citron who plans to give her intravenous iron if her hemoglobin drops below 10.0.  She recently required blood transfusion for anemia.  Stools are intermittently dark.  Current Outpatient Prescriptions  Medication Sig Dispense Refill  . allopurinol (ZYLOPRIM) 300 MG tablet Take 300 mg by mouth daily.      . calcium-vitamin D 500 MG tablet Take 1 tablet by mouth 2 (two) times daily.        . celecoxib (CELEBREX) 200 MG capsule Take 200 mg by mouth daily.      Marland Kitchen dicyclomine (BENTYL) 10 MG capsule Take 10 mg by mouth as needed. For stomach cramps      . digoxin (LANOXIN) 0.25 MG tablet Take 0.125 mg by mouth daily. In the morning      . fexofenadine (ALLEGRA ALLERGY) 180 MG tablet Take 180 mg by mouth daily. For allergies. As needed      . furosemide (LASIX) 40 MG tablet Take 20-40 mg by mouth daily as needed for fluid.      Marland Kitchen lansoprazole (PREVACID) 30 MG capsule Take 30 mg by mouth daily.        Marland Kitchen  levothyroxine (SYNTHROID, LEVOTHROID) 88 MCG tablet Take 88 mcg by mouth daily before breakfast.      . metoprolol tartrate (LOPRESSOR) 25 MG tablet Take 12.5-25 mg by mouth 2 (two) times daily. Take 0.5 tablet in AM and 1 tablet in PM      . multivitamin (THERAGRAN) per tablet Take 1 tablet by mouth daily.       . Naphazoline-Pheniramine (OPCON-A) 0.027-0.315 % SOLN Apply 1 drop to eye 2 (two) times daily as needed (itchy eyes).      . nitroGLYCERIN (NITROSTAT) 0.4 MG SL tablet Place 0.4 mg under the tongue every 5 (five) minutes as needed for chest pain.      . Olmesartan-Amlodipine-HCTZ (TRIBENZOR) 40-5-12.5 MG TABS Take 1 tablet by mouth every morning.      . Olmesartan-Amlodipine-HCTZ (TRIBENZOR) 40-5-12.5 MG TABS Take 1 tablet by mouth daily.      . Omega-3 Fatty Acids (FISH OIL) 1000 MG CAPS Take 1,000 mg by mouth 2 (two) times daily.       . potassium chloride SA (K-DUR,KLOR-CON) 20 MEQ tablet Take 20 mEq by mouth daily.      . prednisoLONE acetate (PRED FORTE) 1 % ophthalmic suspension Place 1 drop into both eyes 4 (four) times  daily.      . simvastatin (ZOCOR) 20 MG tablet Take 10 mg by mouth daily.      Marland Kitchen warfarin (COUMADIN) 5 MG tablet Take 2.5-5 mg by mouth daily. Take 2.5mg  on Mon and Fri. Take 5mg  all other days      . zolpidem (AMBIEN CR) 12.5 MG CR tablet Take 12.5 mg by mouth at bedtime as needed for sleep.       No current facility-administered medications for this visit.    Allergies  Allergen Reactions  . Amiodarone Shortness Of Breath  . Alendronate Sodium     GI upset  . Macrodantin [Nitrofurantoin]     GI upset  . Meclizine Swelling    tongue  . Norvasc [Amlodipine Besylate]     edema  . Tussionex Pennkinetic Er [Hydrocod Polst-Cpm Polst Er] Other (See Comments)    Reaction unknown    Patient Active Problem List   Diagnosis Date Noted  . S/P aortic valve replacement with metallic valve 17/40/8144    Priority: Medium  . Melena 05/19/2013  . Coagulopathy  01/27/2013  . Iron deficiency anemia secondary to blood loss (chronic) 07/24/2012  . Malaise and fatigue 05/21/2012  . Dyslipidemia 12/04/2011  . Atrial fibrillation 09/06/2011  . Encounter for long-term (current) use of anticoagulants 08/08/2011  . Nonsustained ventricular tachycardia 07/21/2011  . Hypokalemia 07/20/2011  . Hyponatremia 07/20/2011  . Bronchitis 07/20/2011  . Hypothyroidism   . Benign hypertensive heart disease without heart failure     History  Smoking status  . Former Smoker -- 1.00 packs/day for 20 years  . Types: Cigarettes  . Quit date: 04/01/1988  Smokeless tobacco  . Never Used    History  Alcohol Use  . 4.8 oz/week  . 4 Glasses of wine, 4 Shots of liquor per week    Comment: burbon or glass wine daily    Family History  Problem Relation Age of Onset  . Dementia Mother   . Coronary artery disease Father     Review of Systems: Constitutional: no fever chills diaphoresis or fatigue or change in weight.  Head and neck: no hearing loss, no epistaxis, no photophobia or visual disturbance. Respiratory: No cough, shortness of breath or wheezing. Cardiovascular: No chest pain peripheral edema, palpitations. Gastrointestinal: No abdominal distention, no abdominal pain, no change in bowel habits hematochezia or melena. Genitourinary: No dysuria, no frequency, no urgency, no nocturia. Musculoskeletal:No arthralgias, no back pain, no gait disturbance or myalgias. Neurological: No dizziness, no headaches, no numbness, no seizures, no syncope, no weakness, no tremors. Hematologic: No lymphadenopathy, no easy bruising. Psychiatric: No confusion, no hallucinations, no sleep disturbance.    Physical Exam: Filed Vitals:   06/15/13 1550  BP: 144/88  Pulse: 81   the general appearance reveals a well-developed well-nourished woman in no distress.  She is looking forward to going to the beach tomorrow with 3 other women.The head and neck exam reveals pupils  equal and reactive.  Extraocular movements are full.  There is no scleral icterus.  The mouth and pharynx are normal.  The neck is supple.  The carotids reveal no bruits.  The jugular venous pressure is normal.  The  thyroid is not enlarged.  There is no lymphadenopathy.  The chest is clear to percussion and auscultation.  There are no rales or rhonchi.  Expansion of the chest is symmetrical.  The precordium is quiet.  The first heart sound is normal.  The aortic valve click is sharp.  There is  no aortic insufficiency murmur  There is no  gallop rub or click.  There is no abnormal lift or heave.  The abdomen is soft and nontender.  The bowel sounds are normal.  The liver and spleen are not enlarged.  There are no abdominal masses.  There are no abdominal bruits.  Extremities reveal good pedal pulses.  There is no phlebitis or edema.  There is no cyanosis or clubbing.  Strength is normal and symmetrical in all extremities.  There is no lateralizing weakness.  There are no sensory deficits.  The skin is warm and dry.  There is no rash.  EKG today shows atrial fibrillation with wide spread ST and T-wave abnormalities unchanged from 03/10/12.  Assessment / Plan: Continue same medication.  Recheck here in 4 months for followup office visit.  She will have her fasting lab work including lipid panel hepatic function panel basal metabolic panel TSH and free T4 drawn at the [facility] at one of her venipuncture days

## 2013-06-15 NOTE — Assessment & Plan Note (Signed)
Patient has not been aware of any racing of her heart.  She is in chronic atrial fibrillation with controlled ventricular response

## 2013-06-15 NOTE — Assessment & Plan Note (Signed)
Blood pressure was remaining stable on current therapy.  She has not been having any headaches dizziness or syncope.  Her weight has been stable

## 2013-06-15 NOTE — Progress Notes (Signed)
INR at goal Pt is doing well with no complaints No missed or extra doses No unusual bleeding or bruising No diet or medication changes Slight dose increase in coumadin appears to be working Pt's Hgb is up to 12 today after recent transfusion earlier this month Plan: No changes Continue Coumadin 5mg . Recheck PT/INR on Wednesday 06/30/13 at 10am for lab and 10:15am for coumadin clinic.

## 2013-06-15 NOTE — Patient Instructions (Signed)
Your physician recommends that you continue on your current medications as directed. Please refer to the Current Medication list given to you today.  Your physician wants you to follow-up in: 4 month ov You will receive a reminder letter in the mail two months in advance. If you don't receive a letter, please call our office to schedule the follow-up appointment.  

## 2013-06-25 ENCOUNTER — Other Ambulatory Visit: Payer: Self-pay | Admitting: *Deleted

## 2013-06-25 ENCOUNTER — Other Ambulatory Visit (HOSPITAL_BASED_OUTPATIENT_CLINIC_OR_DEPARTMENT_OTHER): Payer: Medicare Other

## 2013-06-25 ENCOUNTER — Telehealth: Payer: Self-pay | Admitting: *Deleted

## 2013-06-25 ENCOUNTER — Ambulatory Visit (INDEPENDENT_AMBULATORY_CARE_PROVIDER_SITE_OTHER): Payer: Self-pay | Admitting: Pharmacist

## 2013-06-25 DIAGNOSIS — D689 Coagulation defect, unspecified: Secondary | ICD-10-CM

## 2013-06-25 DIAGNOSIS — Z954 Presence of other heart-valve replacement: Secondary | ICD-10-CM

## 2013-06-25 DIAGNOSIS — I4891 Unspecified atrial fibrillation: Secondary | ICD-10-CM

## 2013-06-25 DIAGNOSIS — D500 Iron deficiency anemia secondary to blood loss (chronic): Secondary | ICD-10-CM

## 2013-06-25 DIAGNOSIS — K921 Melena: Secondary | ICD-10-CM

## 2013-06-25 DIAGNOSIS — Z7901 Long term (current) use of anticoagulants: Secondary | ICD-10-CM

## 2013-06-25 LAB — CBC WITH DIFFERENTIAL/PLATELET
BASO%: 0.2 % (ref 0.0–2.0)
Basophils Absolute: 0 10*3/uL (ref 0.0–0.1)
EOS%: 1 % (ref 0.0–7.0)
Eosinophils Absolute: 0.1 10*3/uL (ref 0.0–0.5)
HEMATOCRIT: 34.3 % — AB (ref 34.8–46.6)
HGB: 11.2 g/dL — ABNORMAL LOW (ref 11.6–15.9)
LYMPH%: 15.7 % (ref 14.0–49.7)
MCH: 33.2 pg (ref 25.1–34.0)
MCHC: 32.7 g/dL (ref 31.5–36.0)
MCV: 101.8 fL — ABNORMAL HIGH (ref 79.5–101.0)
MONO#: 0.3 10*3/uL (ref 0.1–0.9)
MONO%: 5.3 % (ref 0.0–14.0)
NEUT#: 4 10*3/uL (ref 1.5–6.5)
NEUT%: 77.8 % — AB (ref 38.4–76.8)
Platelets: 183 10*3/uL (ref 145–400)
RBC: 3.37 10*6/uL — ABNORMAL LOW (ref 3.70–5.45)
RDW: 15.9 % — AB (ref 11.2–14.5)
WBC: 5.1 10*3/uL (ref 3.9–10.3)
lymph#: 0.8 10*3/uL — ABNORMAL LOW (ref 0.9–3.3)

## 2013-06-25 LAB — HOLD TUBE, BLOOD BANK

## 2013-06-25 LAB — FERRITIN CHCC: Ferritin: 114 ng/ml (ref 9–269)

## 2013-06-25 LAB — PROTIME-INR
INR: 4 — AB (ref 2.00–3.50)
PROTIME: 48 s — AB (ref 10.6–13.4)

## 2013-06-25 LAB — POCT INR: INR: 4

## 2013-06-25 MED ORDER — WARFARIN SODIUM 5 MG PO TABS: 5.0000 mg | ORAL_TABLET | Freq: Every day | ORAL | Status: DC

## 2013-06-25 NOTE — Progress Notes (Signed)
Pt left message on CC phone complaining of "dark tarry stools" She also left message with Val, who recommended she have labs evaluated by her PCP INR was 4.0 at PCP I returned her call. Hold Coumadin today (Fri, 3/27)  then 5mg  on Sat and Sun, 2.5 mg on Mon, 5mg  on Tue and we will recheck PT/INR on Wednesday 06/30/13 at 10am for lab and 10:15am for coumadin clinic.

## 2013-06-25 NOTE — Telephone Encounter (Signed)
Pt called to this RN to state ongoing dark stools . Crystin would like to get blood checked before scheduled appointment on Wednesday ( 4/1) " because I was hoping to go to the beach before Easter but want to fill stronger."  " I thought maybe I could come in Monday."  Per discussion of above symptoms of dark stools which is an ongoing situation with pt who is followed by Dr Watt Climes- plan is for pt to come in today for lab and review for possible need for additional interventions.  Appointment made for today.

## 2013-06-25 NOTE — Patient Instructions (Signed)
Hold Coumadin today then 5mg  on Sat and Sun, 2.5 mg on Mon, 5mg  on Tue and we will recheck PT/INR on Wednesday 06/30/13 at 10am for lab and 10:15am for coumadin clinic.

## 2013-06-28 ENCOUNTER — Other Ambulatory Visit: Payer: Self-pay | Admitting: Oncology

## 2013-06-28 ENCOUNTER — Other Ambulatory Visit: Payer: Self-pay | Admitting: Cardiology

## 2013-06-28 ENCOUNTER — Telehealth: Payer: Self-pay | Admitting: Cardiology

## 2013-06-28 NOTE — Telephone Encounter (Signed)
New message  ° ° °Returning call back to nurse.  °

## 2013-06-29 NOTE — Telephone Encounter (Signed)
Patient aware of labs.  

## 2013-06-30 ENCOUNTER — Ambulatory Visit (HOSPITAL_BASED_OUTPATIENT_CLINIC_OR_DEPARTMENT_OTHER): Payer: Medicare Other

## 2013-06-30 ENCOUNTER — Other Ambulatory Visit (HOSPITAL_BASED_OUTPATIENT_CLINIC_OR_DEPARTMENT_OTHER): Payer: Medicare Other

## 2013-06-30 ENCOUNTER — Other Ambulatory Visit: Payer: Self-pay | Admitting: Oncology

## 2013-06-30 ENCOUNTER — Ambulatory Visit (HOSPITAL_BASED_OUTPATIENT_CLINIC_OR_DEPARTMENT_OTHER): Payer: Self-pay | Admitting: Pharmacist

## 2013-06-30 VITALS — BP 178/74 | HR 68 | Temp 97.5°F | Resp 20

## 2013-06-30 DIAGNOSIS — D509 Iron deficiency anemia, unspecified: Secondary | ICD-10-CM

## 2013-06-30 DIAGNOSIS — R5381 Other malaise: Secondary | ICD-10-CM

## 2013-06-30 DIAGNOSIS — Z954 Presence of other heart-valve replacement: Secondary | ICD-10-CM

## 2013-06-30 DIAGNOSIS — Z7901 Long term (current) use of anticoagulants: Secondary | ICD-10-CM

## 2013-06-30 DIAGNOSIS — D689 Coagulation defect, unspecified: Secondary | ICD-10-CM

## 2013-06-30 DIAGNOSIS — I4891 Unspecified atrial fibrillation: Secondary | ICD-10-CM

## 2013-06-30 DIAGNOSIS — R5383 Other fatigue: Principal | ICD-10-CM

## 2013-06-30 DIAGNOSIS — E785 Hyperlipidemia, unspecified: Secondary | ICD-10-CM

## 2013-06-30 DIAGNOSIS — I359 Nonrheumatic aortic valve disorder, unspecified: Secondary | ICD-10-CM

## 2013-06-30 LAB — CBC WITH DIFFERENTIAL/PLATELET
BASO%: 0.2 % (ref 0.0–2.0)
Basophils Absolute: 0 10*3/uL (ref 0.0–0.1)
EOS%: 1.1 % (ref 0.0–7.0)
Eosinophils Absolute: 0.1 10*3/uL (ref 0.0–0.5)
HEMATOCRIT: 29.6 % — AB (ref 34.8–46.6)
HGB: 9.6 g/dL — ABNORMAL LOW (ref 11.6–15.9)
LYMPH%: 12.6 % — AB (ref 14.0–49.7)
MCH: 33.1 pg (ref 25.1–34.0)
MCHC: 32.4 g/dL (ref 31.5–36.0)
MCV: 102.1 fL — AB (ref 79.5–101.0)
MONO#: 0.4 10*3/uL (ref 0.1–0.9)
MONO%: 9.7 % (ref 0.0–14.0)
NEUT#: 3.5 10*3/uL (ref 1.5–6.5)
NEUT%: 76.4 % (ref 38.4–76.8)
NRBC: 0 % (ref 0–0)
Platelets: 178 10*3/uL (ref 145–400)
RBC: 2.9 10*6/uL — AB (ref 3.70–5.45)
RDW: 16.6 % — ABNORMAL HIGH (ref 11.2–14.5)
WBC: 4.5 10*3/uL (ref 3.9–10.3)
lymph#: 0.6 10*3/uL — ABNORMAL LOW (ref 0.9–3.3)

## 2013-06-30 LAB — POCT INR: INR: 1.9

## 2013-06-30 LAB — PROTIME-INR
INR: 1.9 — ABNORMAL LOW (ref 2.00–3.50)
Protime: 22.8 Seconds — ABNORMAL HIGH (ref 10.6–13.4)

## 2013-06-30 MED ORDER — SODIUM CHLORIDE 0.9 % IV SOLN
Freq: Once | INTRAVENOUS | Status: AC
  Administered 2013-06-30: 11:00:00 via INTRAVENOUS

## 2013-06-30 MED ORDER — SODIUM CHLORIDE 0.9 % IV SOLN
1020.0000 mg | Freq: Once | INTRAVENOUS | Status: AC
  Administered 2013-06-30: 1020 mg via INTRAVENOUS
  Filled 2013-06-30: qty 34

## 2013-06-30 NOTE — Progress Notes (Signed)
INR = 1.9  Hgb = 9.6 g/dL Pt held 1 dose of Coumadin on 3/27 due to elevated INR (= 4) then took 5mg  on Sat and Sun, 2.5 mg on Mon, 5mg  on Tue. She continues to have dark stools. She has been eating spinach salads (had 2-3 of them over the past week). Pt will get Feraheme infusion today given anemia per Dr. Jana Hakim. She leaves today for the beach & will return next Thurs. INR now subtherapeutic.  I discussed w/ Dr. Jana Hakim & he'd like pt to increase her Coumadin dose but not go on Lovenox.  I will restart pt on Coumadin 5 mg/day; 2.5 mg on Mon/Fri-- a dose that has kept her therapeutic in the past. Pt will have repeat protime on 07/09/13. Note: pt will see Dr. Mare Ferrari in 3 months.  His RN called pt yesterday & was supposed to be discussing w/ Dr. Mare Ferrari the risks vs benefit  of changing pts INR goal to 2-2.5 given her GI bleeding at the INR goal range of 2.5-3.5. Kennith Center, Pharm.D., CPP 06/30/2013@11 :24 AM

## 2013-07-08 ENCOUNTER — Telehealth: Payer: Self-pay | Admitting: *Deleted

## 2013-07-08 NOTE — Telephone Encounter (Signed)
Agree 

## 2013-07-08 NOTE — Telephone Encounter (Signed)
Patients Hgb continues to drop and patient needs iron infusions. Discussed with  Dr. Mare Ferrari and because of this will change her INR goal to 2.5-3.0. Advised patient and will send to Coumadin Clinic at

## 2013-07-08 NOTE — Telephone Encounter (Signed)
Cancer center. Patient has been evaluated by GI as well as Hematology and will continue follow ups with them as well.

## 2013-07-09 ENCOUNTER — Ambulatory Visit (HOSPITAL_BASED_OUTPATIENT_CLINIC_OR_DEPARTMENT_OTHER): Payer: Medicare Other | Admitting: Pharmacist

## 2013-07-09 ENCOUNTER — Other Ambulatory Visit (HOSPITAL_BASED_OUTPATIENT_CLINIC_OR_DEPARTMENT_OTHER): Payer: Medicare Other

## 2013-07-09 DIAGNOSIS — Z7901 Long term (current) use of anticoagulants: Secondary | ICD-10-CM

## 2013-07-09 DIAGNOSIS — D689 Coagulation defect, unspecified: Secondary | ICD-10-CM

## 2013-07-09 DIAGNOSIS — I4891 Unspecified atrial fibrillation: Secondary | ICD-10-CM

## 2013-07-09 DIAGNOSIS — I359 Nonrheumatic aortic valve disorder, unspecified: Secondary | ICD-10-CM

## 2013-07-09 DIAGNOSIS — Z954 Presence of other heart-valve replacement: Secondary | ICD-10-CM

## 2013-07-09 LAB — POCT INR: INR: 1.9

## 2013-07-09 LAB — CBC WITH DIFFERENTIAL/PLATELET
BASO%: 0.6 % (ref 0.0–2.0)
BASOS ABS: 0 10*3/uL (ref 0.0–0.1)
EOS%: 0.6 % (ref 0.0–7.0)
Eosinophils Absolute: 0 10*3/uL (ref 0.0–0.5)
HEMATOCRIT: 33.7 % — AB (ref 34.8–46.6)
HEMOGLOBIN: 11 g/dL — AB (ref 11.6–15.9)
LYMPH#: 0.7 10*3/uL — AB (ref 0.9–3.3)
LYMPH%: 13 % — ABNORMAL LOW (ref 14.0–49.7)
MCH: 33.7 pg (ref 25.1–34.0)
MCHC: 32.6 g/dL (ref 31.5–36.0)
MCV: 103.4 fL — ABNORMAL HIGH (ref 79.5–101.0)
MONO#: 0.4 10*3/uL (ref 0.1–0.9)
MONO%: 6.4 % (ref 0.0–14.0)
NEUT#: 4.3 10*3/uL (ref 1.5–6.5)
NEUT%: 79.4 % — AB (ref 38.4–76.8)
Platelets: 181 10*3/uL (ref 145–400)
RBC: 3.26 10*6/uL — ABNORMAL LOW (ref 3.70–5.45)
RDW: 16.6 % — ABNORMAL HIGH (ref 11.2–14.5)
WBC: 5.5 10*3/uL (ref 3.9–10.3)

## 2013-07-09 LAB — PROTIME-INR
INR: 1.9 — ABNORMAL LOW (ref 2.00–3.50)
Protime: 22.8 Seconds — ABNORMAL HIGH (ref 10.6–13.4)

## 2013-07-09 NOTE — Progress Notes (Signed)
INR below goal again Pt is doing well she is fatigued Her Hgb today is 11 and platelets are 181 She reports continued minor dark stools (this is a chronic issue) but other than this no other bruising or bleeding No medication or diet changes (no greens or salads since last appointment) Pt is worried about INR still being below goal today, Dr. Jana Hakim did not want to use lovenox last week when INR was 1.9 as well so will only increase dose. Risks vs. Benefits of higher vs. Lower INR goal discussed with Dr. Mare Ferrari, per telephone note, goal range changed to 2.5-3.0.  Plan: Take Coumadin 7.5mg  today then increase to 5 mg daily. (this may be on the aggressive side, but patient has been on 5 mg daily in the past, and want to make sure INR >2.5 next week) Return 07/16/13 at 10:30 am for lab and 10:45 am for coumadin clinic.

## 2013-07-09 NOTE — Patient Instructions (Signed)
INR below goal Take Coumadin 7.5mg  today then increase to 5 mg daily. Return 07/16/13 at 10:30 am for lab and 10:45 am for coumadin clinic.

## 2013-07-15 ENCOUNTER — Other Ambulatory Visit: Payer: Self-pay | Admitting: Cardiology

## 2013-07-16 ENCOUNTER — Ambulatory Visit (HOSPITAL_BASED_OUTPATIENT_CLINIC_OR_DEPARTMENT_OTHER): Payer: Medicare Other | Admitting: Pharmacist

## 2013-07-16 ENCOUNTER — Other Ambulatory Visit (HOSPITAL_BASED_OUTPATIENT_CLINIC_OR_DEPARTMENT_OTHER): Payer: Medicare Other

## 2013-07-16 DIAGNOSIS — I4891 Unspecified atrial fibrillation: Secondary | ICD-10-CM

## 2013-07-16 DIAGNOSIS — D689 Coagulation defect, unspecified: Secondary | ICD-10-CM

## 2013-07-16 DIAGNOSIS — Z7901 Long term (current) use of anticoagulants: Secondary | ICD-10-CM

## 2013-07-16 DIAGNOSIS — Z954 Presence of other heart-valve replacement: Secondary | ICD-10-CM

## 2013-07-16 DIAGNOSIS — I359 Nonrheumatic aortic valve disorder, unspecified: Secondary | ICD-10-CM

## 2013-07-16 LAB — POCT INR: INR: 3.5

## 2013-07-16 LAB — CBC WITH DIFFERENTIAL/PLATELET
BASO%: 0.2 % (ref 0.0–2.0)
Basophils Absolute: 0 10*3/uL (ref 0.0–0.1)
EOS%: 1.6 % (ref 0.0–7.0)
Eosinophils Absolute: 0.1 10*3/uL (ref 0.0–0.5)
HEMATOCRIT: 34.4 % — AB (ref 34.8–46.6)
HGB: 11.4 g/dL — ABNORMAL LOW (ref 11.6–15.9)
LYMPH%: 14.4 % (ref 14.0–49.7)
MCH: 34.3 pg — AB (ref 25.1–34.0)
MCHC: 33.1 g/dL (ref 31.5–36.0)
MCV: 103.6 fL — AB (ref 79.5–101.0)
MONO#: 0.3 10*3/uL (ref 0.1–0.9)
MONO%: 7.7 % (ref 0.0–14.0)
NEUT#: 3.3 10*3/uL (ref 1.5–6.5)
NEUT%: 76.1 % (ref 38.4–76.8)
Platelets: 174 10*3/uL (ref 145–400)
RBC: 3.32 10*6/uL — ABNORMAL LOW (ref 3.70–5.45)
RDW: 15.9 % — ABNORMAL HIGH (ref 11.2–14.5)
WBC: 4.3 10*3/uL (ref 3.9–10.3)
lymph#: 0.6 10*3/uL — ABNORMAL LOW (ref 0.9–3.3)
nRBC: 0 % (ref 0–0)

## 2013-07-16 LAB — PROTIME-INR
INR: 3.5 (ref 2.00–3.50)
Protime: 42 Seconds — ABNORMAL HIGH (ref 10.6–13.4)

## 2013-07-16 NOTE — Progress Notes (Signed)
INR slightly above goal today. INR goal = 2.5 - 3 per recent note by Dr. Mare Ferrari. Hg/Hct: 11.4/34.4, Pltc = 174 INR increased from 1.9 on 07/09/13 to 3.5 today. Pt has previously been above goal on 5mg  daily. Also she has been subtherapeutic on 5mg  daily except 2.5mg  on Mon&Fri. No bleeding or unusual bruising noted. No changes in medications. No missed coumadin doses. Pt has not had hardly any greens, spinach, salads, etc since she had a subtherapeutic INR. She plans to incorporate more spinach, salads, greens, etc now that her is near goal. Will slightly decrease coumadin dose since INR above goal with a rather quick increase in INR after one week. Decrease Coumadin to 5mg  daily except 2.5mg  on Fridays. Recheck INR in 10 days with MD visit on 07/27/13:  Lab at 10am, Dr. Jana Hakim at 10:30am and Coumadin clinic at 11am.

## 2013-07-16 NOTE — Patient Instructions (Signed)
Decrease Coumadin to 5mg  daily except 2.5mg  on Fridays. Recheck INR in 10 days with MD visit on 07/27/13:  Lab at 10am, Dr. Jana Hakim at 10:30am and Coumadin clinic at 11am.

## 2013-07-25 ENCOUNTER — Other Ambulatory Visit: Payer: Self-pay | Admitting: *Deleted

## 2013-07-25 DIAGNOSIS — D500 Iron deficiency anemia secondary to blood loss (chronic): Secondary | ICD-10-CM

## 2013-07-26 ENCOUNTER — Other Ambulatory Visit: Payer: Self-pay | Admitting: *Deleted

## 2013-07-26 DIAGNOSIS — E039 Hypothyroidism, unspecified: Secondary | ICD-10-CM

## 2013-07-27 ENCOUNTER — Ambulatory Visit: Payer: Medicare Other | Admitting: Pharmacist

## 2013-07-27 ENCOUNTER — Telehealth: Payer: Self-pay | Admitting: Oncology

## 2013-07-27 ENCOUNTER — Other Ambulatory Visit (HOSPITAL_BASED_OUTPATIENT_CLINIC_OR_DEPARTMENT_OTHER): Payer: Medicare Other

## 2013-07-27 ENCOUNTER — Ambulatory Visit (HOSPITAL_BASED_OUTPATIENT_CLINIC_OR_DEPARTMENT_OTHER): Payer: Medicare Other | Admitting: Oncology

## 2013-07-27 VITALS — BP 171/90 | HR 88 | Temp 97.8°F | Resp 20 | Ht 60.0 in | Wt 125.1 lb

## 2013-07-27 DIAGNOSIS — Z7901 Long term (current) use of anticoagulants: Secondary | ICD-10-CM

## 2013-07-27 DIAGNOSIS — D500 Iron deficiency anemia secondary to blood loss (chronic): Secondary | ICD-10-CM

## 2013-07-27 DIAGNOSIS — I4891 Unspecified atrial fibrillation: Secondary | ICD-10-CM

## 2013-07-27 DIAGNOSIS — D689 Coagulation defect, unspecified: Secondary | ICD-10-CM

## 2013-07-27 DIAGNOSIS — Z954 Presence of other heart-valve replacement: Secondary | ICD-10-CM

## 2013-07-27 LAB — CBC WITH DIFFERENTIAL/PLATELET
BASO%: 0.5 % (ref 0.0–2.0)
Basophils Absolute: 0 10*3/uL (ref 0.0–0.1)
EOS ABS: 0 10*3/uL (ref 0.0–0.5)
EOS%: 0.9 % (ref 0.0–7.0)
HCT: 38.7 % (ref 34.8–46.6)
HGB: 12.8 g/dL (ref 11.6–15.9)
LYMPH%: 15.4 % (ref 14.0–49.7)
MCH: 34.2 pg — ABNORMAL HIGH (ref 25.1–34.0)
MCHC: 33 g/dL (ref 31.5–36.0)
MCV: 103.6 fL — ABNORMAL HIGH (ref 79.5–101.0)
MONO#: 0.3 10*3/uL (ref 0.1–0.9)
MONO%: 6.2 % (ref 0.0–14.0)
NEUT#: 3.3 10*3/uL (ref 1.5–6.5)
NEUT%: 77 % — ABNORMAL HIGH (ref 38.4–76.8)
Platelets: 171 10*3/uL (ref 145–400)
RBC: 3.73 10*6/uL (ref 3.70–5.45)
RDW: 15.7 % — AB (ref 11.2–14.5)
WBC: 4.2 10*3/uL (ref 3.9–10.3)
lymph#: 0.7 10*3/uL — ABNORMAL LOW (ref 0.9–3.3)

## 2013-07-27 LAB — PROTIME-INR
INR: 2.5 (ref 2.00–3.50)
PROTIME: 30 s — AB (ref 10.6–13.4)

## 2013-07-27 LAB — POCT INR: INR: 2.5

## 2013-07-27 LAB — FERRITIN CHCC: FERRITIN: 255 ng/mL (ref 9–269)

## 2013-07-27 NOTE — Progress Notes (Signed)
INR = 2.5 on Coumadin 5 mg daily except 2.5 mg on Fridays Pt is having salads/spinach again.  However, she'd really like to have more. She saw Dr. Jana Hakim today & had a good report.  Her Hgb/Hct are normal & she has had no bleeding! INR stable.  Although pt wanted to increase her dose, I think we should leave her dose alone since we're at goal & she hasn't had bleeding.  We have a more narrow INR goal now, keeping tight control to minimize bleeding risk. Repeat INR in 2 weeks at MD request. Kennith Center, Pharm.D., CPP 07/27/2013@11 :25 AM

## 2013-07-27 NOTE — Progress Notes (Signed)
ID: [name]   DOB: 10/19/1926  MR#: [id number]  EQA#:[id number]  PCP: Darlin Coco, MD GYN:  SU:  OTHER MD: Clarene Essex, Rema Jasmine   HISTORY OF PRESENT ILLNESS: Deborah RochDootsie") Deborah [name] is an 80 year old [place] woman referred by Dr. Watt Climes for evaluation and treatment of iron deficiency anemia.  INTERVAL HISTORY: Deborah [name] returns today for followup of her acute loss loss/on deficiency anemia. She received her therapy most recently 3 weeks ago. She tolerated the treatment well.  REVIEW OF SYSTEMS: She tells me the GI bleeding problem has slowed down considerably and she hasn't had any black or tarry bowel movements now for about a week or 2. As a result her energy is better. Her Coumadin level continues to be "up-and-down" and is being managed through the Coumadin clinic here. She has of course atrial fibrillation, but that is not symptomatic except for the fact that she needs anticoagulation. She feels short of breath when she climbs stairs and she sleeps on 2 pillows. She bruises easily. She has some back and joint pain but it is not more persistent or acute than before. A detailed review of systems was otherwise noncontributory  PAST MEDICAL HISTORY: Past Medical History  Diagnosis Date  . Hypothyroidism   . Stomach problems     seeing Dr Watt Climes  . Hypertension   . GERD (gastroesophageal reflux disease)   . Anemia   . Arrhythmia     Atrial Fibrillation  . Breast cancer     "right; S/P lumpectomy, chemo, XRT"   . Complication of anesthesia     "once I'm awakened, I don't sleep til the following night"  . High cholesterol   . Heart murmur   . History of blood transfusion 1996; ~ 2013    "related to valve replacement; GI bleeding"   . Chronic lower GI bleeding   . Arthritis     "fingers, toes, hips; qwhere" (05/19/2013)  . Gout   . Pneumonia 07/2011    PAST SURGICAL HISTORY: Past Surgical History  Procedure Laterality Date  . Arteriogram  01/12    NO  RAS   . Cardioversion      X 2  . Cardiac catheterization    . Cataract extraction w/ intraocular lens  implant, bilateral Bilateral     bilateral cataract with lens implants  . Cholecystectomy    . Abdominal hysterectomy    . Appendectomy    . Colonoscopy with propofol N/A 11/24/2012    Procedure: COLONOSCOPY WITH PROPOFOL;  Surgeon: Jeryl Columbia, MD;  Location: [facility];  Service: Endoscopy;  Laterality: N/A;  . Breast biopsy Right   . Breast lumpectomy Right   . Aortic valve replacement  1996    [employer]  . Cardiac valve replacement  1996    FAMILY HISTORY Family History  Problem Relation Age of Onset  . Dementia Mother   . Coronary artery disease Father    the patient's father died from a myocardial infarction at the age of 74. The patient's mother died with Alzheimer's disease at the age of 53. She had 2 brothers and one sister. Her sister had colon cancer diagnosed at age 91. Otherwise the only other person with cancer in the family was the patient's mother's twin sister, diagnosed with breast cancer at age 67.  GYNECOLOGIC HISTORY: Menarche age 70, first live birth age 56, she is [place] P3. Underwent menopause 1972. She took hormone replacement for approximately 21 years.  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 Her daughter Dixon Boos runs the [employer] here. Daughter [name] also lives in [place], is a homemaker. Son [name] "[name]" Henne is a Scientist, research (medical). The patient has of 10 grandchildren She attends a [employer].   ADVANCED DIRECTIVES: Living will in place. Her daughter Dixon Boos is her healthcare power of attorney.  HEALTH MAINTENANCE: History  Substance Use Topics  . Smoking status: Former Smoker -- 1.00 packs/day for 20 years    Types: Cigarettes    Quit date: 04/01/1988  . Smokeless tobacco: Never Used  . Alcohol Use: 4.8 oz/week    4 Glasses of wine, 4 Shots of liquor per week      Comment: burbon or glass wine daily     Allergies  Allergen Reactions  . Amiodarone Shortness Of Breath  . Alendronate Sodium     GI upset  . Macrodantin [Nitrofurantoin]     GI upset  . Meclizine Swelling    tongue  . Norvasc [Amlodipine Besylate]     edema  . Tussionex Pennkinetic Er [Hydrocod Polst-Cpm Polst Er] Other (See Comments)    Reaction unknown    Current Outpatient Prescriptions  Medication Sig Dispense Refill  . allopurinol (ZYLOPRIM) 300 MG tablet Take 300 mg by mouth daily.      Marland Kitchen allopurinol (ZYLOPRIM) 300 MG tablet TAKE ONE TABLET EACH DAY  30 tablet  3  . calcium-vitamin D 500 MG tablet Take 1 tablet by mouth 2 (two) times daily.        . celecoxib (CELEBREX) 200 MG capsule Take 200 mg by mouth daily.      Marland Kitchen dicyclomine (BENTYL) 10 MG capsule Take 10 mg by mouth as needed. For stomach cramps      . digoxin (LANOXIN) 0.25 MG tablet Take 0.125 mg by mouth daily. In the morning      . fexofenadine (ALLEGRA ALLERGY) 180 MG tablet Take 180 mg by mouth daily. For allergies. As needed      . furosemide (LASIX) 40 MG tablet Take 20-40 mg by mouth daily as needed for fluid.      Marland Kitchen lansoprazole (PREVACID) 30 MG capsule Take 30 mg by mouth daily.        Marland Kitchen levothyroxine (SYNTHROID, LEVOTHROID) 88 MCG tablet Take 88 mcg by mouth daily before breakfast.      . metoprolol tartrate (LOPRESSOR) 25 MG tablet Take 12.5-25 mg by mouth 2 (two) times daily. Take 0.5 tablet in AM and 1 tablet in PM      . metoprolol tartrate (LOPRESSOR) 25 MG tablet 1/2 TABLET IN THE AM AND 1 TABLET IN PM  135 tablet  0  . multivitamin (THERAGRAN) per tablet Take 1 tablet by mouth daily.       . Naphazoline-Pheniramine (OPCON-A) 0.027-0.315 % SOLN Apply 1 drop to eye 2 (two) times daily as needed (itchy eyes).      . nitroGLYCERIN (NITROSTAT) 0.4 MG SL tablet Place 0.4 mg under the tongue every 5 (five) minutes as needed for chest pain.      . Olmesartan-Amlodipine-HCTZ (TRIBENZOR) 40-5-12.5 MG TABS  Take 1 tablet by mouth daily.      . Omega-3 Fatty Acids (FISH OIL) 1000 MG CAPS Take 1,000 mg by mouth 2 (two) times daily.       . potassium chloride SA (K-DUR,KLOR-CON) 20 MEQ tablet Take 20 mEq by mouth daily.      . prednisoLONE acetate (PRED FORTE)  1 % ophthalmic suspension Place 1 drop into both eyes 4 (four) times daily.      . simvastatin (ZOCOR) 20 MG tablet Take 10 mg by mouth daily.      Marland Kitchen warfarin (COUMADIN) 5 MG tablet Take 1 tablet (5 mg total) by mouth daily. OR AS DIRECTED BY THE COUMADIN CLINIC  30 tablet  2  . zolpidem (AMBIEN CR) 12.5 MG CR tablet Take 12.5 mg by mouth at bedtime as needed for sleep.       No current facility-administered medications for this visit.    OBJECTIVE: Elderly white woman who looks well Filed Vitals:   07/27/13 1022  BP: 171/90  Pulse: 88  Temp: 97.8 F (36.6 C)  Resp: 20     Body mass index is 24.43 kg/(m^2).    ECOG FS: 1  Sclerae unicteri, EOMs intact Oropharynx clear and moist No cervical or supraclavicular adenopathy Lungs no rales or rhonchi Heart irregular rate, valvular "click" audible, unchanged from prior Abd soft, nontender, positive bowel sounds, no masses palpated MSK no focal spinal tenderness, no peripheral edema Neuro: nonfocal, well oriented, friendly affect Breasts: Deferred   LAB RESULTS: Lab Results  Component Value Date   WBC 4.2 07/27/2013   NEUTROABS 3.3 07/27/2013   HGB 12.8 07/27/2013   HCT 38.7 07/27/2013   MCV 103.6* 07/27/2013   PLT 171 07/27/2013      Chemistry      Component Value Date/Time   NA 136* 05/22/2013 0515   NA 130* 01/20/2013 1004   K 4.0 05/22/2013 0515   K 4.6 01/20/2013 1004   CL 96 05/22/2013 0515   CO2 27 05/22/2013 0515   CO2 27 01/20/2013 1004   BUN 14 05/22/2013 0515   BUN 15.9 01/20/2013 1004   CREATININE 0.67 05/22/2013 0515   CREATININE 0.7 01/20/2013 1004      Component Value Date/Time   CALCIUM 9.5 05/22/2013 0515   CALCIUM 9.6 01/20/2013 1004   ALKPHOS 57 05/19/2013  1204   ALKPHOS 56 01/20/2013 1004   AST 34 05/19/2013 1204   AST 17 01/20/2013 1004   ALT 26 05/19/2013 1204   ALT 20 01/20/2013 1004   BILITOT 0.3 05/19/2013 1204   BILITOT 0.28 01/20/2013 1004      No results found for this basename: LABCA2    No components found with this basename: LABCA125     Recent Labs Lab 07/27/13 1011  INR 2.50    Urinalysis    Component Value Date/Time   COLORURINE YELLOW 04/07/2013 [place] 04/07/2013 1123   LABSPEC 1.015 04/07/2013 1123   PHURINE 6.5 04/07/2013 1123   GLUCOSEU NEGATIVE 04/07/2013 1123   GLUCOSEU NEG 05/21/2012 1018   HGBUR NEGATIVE 04/07/2013 1123   BILIRUBINUR NEGATIVE 04/07/2013 1123   KETONESUR NEGATIVE 04/07/2013 1123   PROTEINUR 30* 05/21/2012 1018   UROBILINOGEN 0.2 04/07/2013 1123   NITRITE NEGATIVE 04/07/2013 [place] 04/07/2013 1123   Results for SHERLYNN, [place] (MRN [id number]) as of 07/27/2013 10:35  Ref. Range 04/14/2013 09:52 05/05/2013 10:13 05/20/2013 14:00 05/24/2013 10:05 06/25/2013 13:18  Ferritin Latest Range: 10-291 ng/mL 420 (H) 171 80 514 (H) 114   STUDIES: No results found.   ASSESSMENT: 80 y.o. [place] with iron deficiency anemia secondary to chronic blood loss, s/p oral iron supplementation with poor tolerance and inadequate response  (1) Feraheme started 07/24/2012, repeated whenever ferritin drops <100, mosr recent dose 06/30/2013  PLAN: Deborah [name] is doing very well with the  current regimen. We are checking her ferritin about once a month and her CBC every 2 weeks. We are doing fair he and whenever the ferritin drops below 100 or is about to drop below 100. We can also transfuse as needed for a more acute drop. She is going to continue on Coumadin indefinitely, and she has some bleeding source in the GI tract, so the problem is chronic, on and off, and so the current measures will be continued indefinitely.  I have set her up for labwork every 2 weeks and a visit with me in one year. She  is scheduled through the Coumadin clinic at your discretion. She knows to call for any problems that may develop before her next visit here.      Virgie Dad Magrinat    07/27/2013

## 2013-07-27 NOTE — Telephone Encounter (Signed)
, °

## 2013-07-30 ENCOUNTER — Other Ambulatory Visit: Payer: Self-pay | Admitting: Cardiology

## 2013-08-10 ENCOUNTER — Ambulatory Visit (HOSPITAL_BASED_OUTPATIENT_CLINIC_OR_DEPARTMENT_OTHER): Payer: Medicare Other | Admitting: Pharmacist

## 2013-08-10 ENCOUNTER — Other Ambulatory Visit: Payer: Medicare Other

## 2013-08-10 ENCOUNTER — Other Ambulatory Visit (HOSPITAL_BASED_OUTPATIENT_CLINIC_OR_DEPARTMENT_OTHER): Payer: Medicare Other

## 2013-08-10 ENCOUNTER — Encounter: Payer: Self-pay | Admitting: Cardiology

## 2013-08-10 DIAGNOSIS — D689 Coagulation defect, unspecified: Secondary | ICD-10-CM

## 2013-08-10 DIAGNOSIS — I359 Nonrheumatic aortic valve disorder, unspecified: Secondary | ICD-10-CM

## 2013-08-10 DIAGNOSIS — I4891 Unspecified atrial fibrillation: Secondary | ICD-10-CM

## 2013-08-10 DIAGNOSIS — Z7901 Long term (current) use of anticoagulants: Secondary | ICD-10-CM

## 2013-08-10 DIAGNOSIS — Z954 Presence of other heart-valve replacement: Secondary | ICD-10-CM

## 2013-08-10 LAB — CBC WITH DIFFERENTIAL/PLATELET
BASO%: 0.2 % (ref 0.0–2.0)
Basophils Absolute: 0 10*3/uL (ref 0.0–0.1)
EOS%: 1.1 % (ref 0.0–7.0)
Eosinophils Absolute: 0.1 10*3/uL (ref 0.0–0.5)
HCT: 38.1 % (ref 34.8–46.6)
HGB: 12.9 g/dL (ref 11.6–15.9)
LYMPH%: 17.2 % (ref 14.0–49.7)
MCH: 33.7 pg (ref 25.1–34.0)
MCHC: 33.9 g/dL (ref 31.5–36.0)
MCV: 99.5 fL (ref 79.5–101.0)
MONO#: 0.4 10*3/uL (ref 0.1–0.9)
MONO%: 7.1 % (ref 0.0–14.0)
NEUT#: 4.1 10*3/uL (ref 1.5–6.5)
NEUT%: 74.4 % (ref 38.4–76.8)
Platelets: 186 10*3/uL (ref 145–400)
RBC: 3.83 10*6/uL (ref 3.70–5.45)
RDW: 13.8 % (ref 11.2–14.5)
WBC: 5.5 10*3/uL (ref 3.9–10.3)
lymph#: 1 10*3/uL (ref 0.9–3.3)
nRBC: 0 % (ref 0–0)

## 2013-08-10 LAB — PROTIME-INR
INR: 2.7 (ref 2.00–3.50)
Protime: 32.4 Seconds — ABNORMAL HIGH (ref 10.6–13.4)

## 2013-08-10 LAB — POCT INR: INR: 2.7

## 2013-08-10 NOTE — Progress Notes (Signed)
INR remains at goal of 2.5-3 on coumadin 5mg  daily but 2.5mg  on Friday.  H/H=12.9/38.1  No changes in meds.  Deborah [name] stated that she had some dark stools yesterday and today.  She inquired about labs done on 07/27/13 which included BMET, LFTs, cholesterol panel and TSH/T4.  All were normal and a copy given to Deborah Colver.  Will continue current coumadin dose and check PT/INR in 2 weeks with next scheduled lab.

## 2013-08-12 ENCOUNTER — Other Ambulatory Visit: Payer: Self-pay | Admitting: Cardiology

## 2013-08-12 DIAGNOSIS — G4700 Insomnia, unspecified: Secondary | ICD-10-CM

## 2013-08-24 ENCOUNTER — Ambulatory Visit (HOSPITAL_BASED_OUTPATIENT_CLINIC_OR_DEPARTMENT_OTHER): Payer: Medicare Other | Admitting: Pharmacist

## 2013-08-24 ENCOUNTER — Other Ambulatory Visit (HOSPITAL_BASED_OUTPATIENT_CLINIC_OR_DEPARTMENT_OTHER): Payer: Medicare Other

## 2013-08-24 DIAGNOSIS — I359 Nonrheumatic aortic valve disorder, unspecified: Secondary | ICD-10-CM

## 2013-08-24 DIAGNOSIS — D689 Coagulation defect, unspecified: Secondary | ICD-10-CM

## 2013-08-24 DIAGNOSIS — Z954 Presence of other heart-valve replacement: Secondary | ICD-10-CM

## 2013-08-24 DIAGNOSIS — I4891 Unspecified atrial fibrillation: Secondary | ICD-10-CM

## 2013-08-24 DIAGNOSIS — D649 Anemia, unspecified: Secondary | ICD-10-CM

## 2013-08-24 DIAGNOSIS — Z7901 Long term (current) use of anticoagulants: Secondary | ICD-10-CM

## 2013-08-24 LAB — CBC WITH DIFFERENTIAL/PLATELET
BASO%: 0.7 % (ref 0.0–2.0)
BASOS ABS: 0 10*3/uL (ref 0.0–0.1)
EOS%: 1.5 % (ref 0.0–7.0)
Eosinophils Absolute: 0.1 10*3/uL (ref 0.0–0.5)
HEMATOCRIT: 36.6 % (ref 34.8–46.6)
HEMOGLOBIN: 12 g/dL (ref 11.6–15.9)
LYMPH#: 0.7 10*3/uL — AB (ref 0.9–3.3)
LYMPH%: 13.8 % — ABNORMAL LOW (ref 14.0–49.7)
MCH: 33.3 pg (ref 25.1–34.0)
MCHC: 32.8 g/dL (ref 31.5–36.0)
MCV: 101.6 fL — ABNORMAL HIGH (ref 79.5–101.0)
MONO#: 0.5 10*3/uL (ref 0.1–0.9)
MONO%: 9.8 % (ref 0.0–14.0)
NEUT#: 3.7 10*3/uL (ref 1.5–6.5)
NEUT%: 74.2 % (ref 38.4–76.8)
Platelets: 162 10*3/uL (ref 145–400)
RBC: 3.6 10*6/uL — ABNORMAL LOW (ref 3.70–5.45)
RDW: 14.7 % — ABNORMAL HIGH (ref 11.2–14.5)
WBC: 5 10*3/uL (ref 3.9–10.3)

## 2013-08-24 LAB — POCT INR: INR: 2.5

## 2013-08-24 LAB — PROTIME-INR
INR: 2.5 (ref 2.00–3.50)
Protime: 30 Seconds — ABNORMAL HIGH (ref 10.6–13.4)

## 2013-08-24 LAB — FERRITIN CHCC: Ferritin: 78 ng/ml (ref 9–269)

## 2013-08-24 NOTE — Progress Notes (Signed)
INR at goal today.  CBC and ferritin are still pending.  Deborah [name] is feeling well today.  No changes in meds.  She had some blood in her stool for about 4 days last week, but that has stopped.  Will continue coumadin 5mg  daily and 2.5mg  on Fri. And check PT/INR in 2 weeks.

## 2013-08-28 ENCOUNTER — Other Ambulatory Visit: Payer: Self-pay | Admitting: Oncology

## 2013-08-30 ENCOUNTER — Other Ambulatory Visit: Payer: Self-pay | Admitting: *Deleted

## 2013-08-30 ENCOUNTER — Telehealth: Payer: Self-pay | Admitting: Oncology

## 2013-08-30 ENCOUNTER — Telehealth: Payer: Self-pay | Admitting: *Deleted

## 2013-08-30 NOTE — Telephone Encounter (Signed)
This RN spoke with pt per call from her daughter [name] stating probable need for IV iron.  Per call Hamda states she noticed her ferratin was 22 at last check- she is having some mild blood in her stools. She would like to get an iron infusion this week prior to Thursday- " we are going to the beach and I want to feel good ".  POF sent to scheduling and message left on MW's VM per above.

## 2013-08-30 NOTE — Telephone Encounter (Signed)
S/W PT RE APPT FOR 6/3 INF. OTHER APPTS REMAIN THE SAME.

## 2013-09-01 ENCOUNTER — Other Ambulatory Visit (HOSPITAL_BASED_OUTPATIENT_CLINIC_OR_DEPARTMENT_OTHER): Payer: Medicare Other

## 2013-09-01 ENCOUNTER — Ambulatory Visit (HOSPITAL_BASED_OUTPATIENT_CLINIC_OR_DEPARTMENT_OTHER): Payer: Self-pay | Admitting: Pharmacist

## 2013-09-01 ENCOUNTER — Ambulatory Visit (HOSPITAL_BASED_OUTPATIENT_CLINIC_OR_DEPARTMENT_OTHER): Payer: Medicare Other

## 2013-09-01 VITALS — BP 140/68 | HR 64 | Temp 97.4°F

## 2013-09-01 DIAGNOSIS — R5383 Other fatigue: Secondary | ICD-10-CM

## 2013-09-01 DIAGNOSIS — Z7901 Long term (current) use of anticoagulants: Secondary | ICD-10-CM

## 2013-09-01 DIAGNOSIS — D509 Iron deficiency anemia, unspecified: Secondary | ICD-10-CM

## 2013-09-01 DIAGNOSIS — D500 Iron deficiency anemia secondary to blood loss (chronic): Secondary | ICD-10-CM

## 2013-09-01 DIAGNOSIS — I359 Nonrheumatic aortic valve disorder, unspecified: Secondary | ICD-10-CM

## 2013-09-01 DIAGNOSIS — R5381 Other malaise: Secondary | ICD-10-CM

## 2013-09-01 DIAGNOSIS — D689 Coagulation defect, unspecified: Secondary | ICD-10-CM

## 2013-09-01 DIAGNOSIS — I4891 Unspecified atrial fibrillation: Secondary | ICD-10-CM

## 2013-09-01 DIAGNOSIS — Z954 Presence of other heart-valve replacement: Secondary | ICD-10-CM

## 2013-09-01 LAB — CBC WITH DIFFERENTIAL/PLATELET
BASO%: 0.4 % (ref 0.0–2.0)
Basophils Absolute: 0 10*3/uL (ref 0.0–0.1)
EOS%: 1.3 % (ref 0.0–7.0)
Eosinophils Absolute: 0.1 10*3/uL (ref 0.0–0.5)
HCT: 34 % — ABNORMAL LOW (ref 34.8–46.6)
HGB: 11.3 g/dL — ABNORMAL LOW (ref 11.6–15.9)
LYMPH#: 0.9 10*3/uL (ref 0.9–3.3)
LYMPH%: 19.9 % (ref 14.0–49.7)
MCH: 32.9 pg (ref 25.1–34.0)
MCHC: 33.2 g/dL (ref 31.5–36.0)
MCV: 99.1 fL (ref 79.5–101.0)
MONO#: 0.2 10*3/uL (ref 0.1–0.9)
MONO%: 5.1 % (ref 0.0–14.0)
NEUT#: 3.3 10*3/uL (ref 1.5–6.5)
NEUT%: 73.3 % (ref 38.4–76.8)
Platelets: 173 10*3/uL (ref 145–400)
RBC: 3.43 10*6/uL — ABNORMAL LOW (ref 3.70–5.45)
RDW: 14 % (ref 11.2–14.5)
WBC: 4.5 10*3/uL (ref 3.9–10.3)
nRBC: 0 % (ref 0–0)

## 2013-09-01 LAB — POCT INR: INR: 3.3

## 2013-09-01 LAB — PROTIME-INR
INR: 3.3 (ref 2.00–3.50)
Protime: 39.6 Seconds — ABNORMAL HIGH (ref 10.6–13.4)

## 2013-09-01 MED ORDER — HEPARIN SOD (PORK) LOCK FLUSH 100 UNIT/ML IV SOLN
500.0000 [IU] | Freq: Once | INTRAVENOUS | Status: DC | PRN
  Filled 2013-09-01: qty 5

## 2013-09-01 MED ORDER — SODIUM CHLORIDE 0.9 % IJ SOLN
10.0000 mL | INTRAMUSCULAR | Status: DC | PRN
  Filled 2013-09-01: qty 10

## 2013-09-01 MED ORDER — HEPARIN SOD (PORK) LOCK FLUSH 100 UNIT/ML IV SOLN
250.0000 [IU] | Freq: Once | INTRAVENOUS | Status: DC | PRN
  Filled 2013-09-01: qty 5

## 2013-09-01 MED ORDER — SODIUM CHLORIDE 0.9 % IJ SOLN
3.0000 mL | Freq: Once | INTRAMUSCULAR | Status: DC | PRN
  Filled 2013-09-01: qty 10

## 2013-09-01 MED ORDER — ALTEPLASE 2 MG IJ SOLR
2.0000 mg | Freq: Once | INTRAMUSCULAR | Status: DC | PRN
  Filled 2013-09-01: qty 2

## 2013-09-01 MED ORDER — SODIUM CHLORIDE 0.9 % IV SOLN
1020.0000 mg | Freq: Once | INTRAVENOUS | Status: AC
  Administered 2013-09-01: 1020 mg via INTRAVENOUS
  Filled 2013-09-01: qty 34

## 2013-09-01 MED ORDER — SODIUM CHLORIDE 0.9 % IV SOLN
Freq: Once | INTRAVENOUS | Status: AC
  Administered 2013-09-01: 15:00:00 via INTRAVENOUS

## 2013-09-01 NOTE — Patient Instructions (Signed)

## 2013-09-01 NOTE — Progress Notes (Addendum)
INR = 3.3 on coumadin 5 mg daily except 2.5 mg on Fridays. Hgb = 11.3 g/dL today. She has had dark stools again. Pt is getting Feraheme today. Pt will have spinach tonight & take 2.5 mg of Coumadin today then she will restart her usual dose of Coumadin. She is going to the beach & will be back the week of 6/22.  We plan to see her again on 6/23. Kennith Center, Pharm.D., CPP 09/01/2013@2 :14 PM

## 2013-09-07 ENCOUNTER — Other Ambulatory Visit: Payer: Medicare Other

## 2013-09-07 ENCOUNTER — Ambulatory Visit: Payer: Medicare Other

## 2013-09-20 ENCOUNTER — Other Ambulatory Visit: Payer: Self-pay | Admitting: Cardiology

## 2013-09-21 ENCOUNTER — Ambulatory Visit (HOSPITAL_BASED_OUTPATIENT_CLINIC_OR_DEPARTMENT_OTHER): Payer: Self-pay | Admitting: Pharmacist

## 2013-09-21 ENCOUNTER — Other Ambulatory Visit (HOSPITAL_BASED_OUTPATIENT_CLINIC_OR_DEPARTMENT_OTHER): Payer: Medicare Other

## 2013-09-21 DIAGNOSIS — Z954 Presence of other heart-valve replacement: Secondary | ICD-10-CM

## 2013-09-21 DIAGNOSIS — I4891 Unspecified atrial fibrillation: Secondary | ICD-10-CM

## 2013-09-21 DIAGNOSIS — D689 Coagulation defect, unspecified: Secondary | ICD-10-CM

## 2013-09-21 DIAGNOSIS — Z7901 Long term (current) use of anticoagulants: Secondary | ICD-10-CM

## 2013-09-21 DIAGNOSIS — I359 Nonrheumatic aortic valve disorder, unspecified: Secondary | ICD-10-CM

## 2013-09-21 LAB — CBC WITH DIFFERENTIAL/PLATELET
BASO%: 0.2 % (ref 0.0–2.0)
Basophils Absolute: 0 10*3/uL (ref 0.0–0.1)
EOS%: 1.4 % (ref 0.0–7.0)
Eosinophils Absolute: 0.1 10*3/uL (ref 0.0–0.5)
HEMATOCRIT: 35.3 % (ref 34.8–46.6)
HGB: 11.9 g/dL (ref 11.6–15.9)
LYMPH%: 17.9 % (ref 14.0–49.7)
MCH: 33.7 pg (ref 25.1–34.0)
MCHC: 33.7 g/dL (ref 31.5–36.0)
MCV: 100 fL (ref 79.5–101.0)
MONO#: 0.3 10*3/uL (ref 0.1–0.9)
MONO%: 7.8 % (ref 0.0–14.0)
NEUT#: 3.2 10*3/uL (ref 1.5–6.5)
NEUT%: 72.7 % (ref 38.4–76.8)
PLATELETS: 172 10*3/uL (ref 145–400)
RBC: 3.53 10*6/uL — ABNORMAL LOW (ref 3.70–5.45)
RDW: 14.9 % — ABNORMAL HIGH (ref 11.2–14.5)
WBC: 4.4 10*3/uL (ref 3.9–10.3)
lymph#: 0.8 10*3/uL — ABNORMAL LOW (ref 0.9–3.3)
nRBC: 0 % (ref 0–0)

## 2013-09-21 LAB — PROTIME-INR
INR: 2.6 (ref 2.00–3.50)
Protime: 31.2 Seconds — ABNORMAL HIGH (ref 10.6–13.4)

## 2013-09-21 LAB — POCT INR: INR: 2.6

## 2013-09-21 NOTE — Progress Notes (Signed)
INR = 2.6   Goal 2.5-3 INR is within goal range. No medication or dietary changes. Patient has had a few dark stools. H/H = 11.9/35.3 and stable. She will be travelling to [place] on August 26th with her grandchildren and hopes to feel well for the trip, she is hoping to get iron before the trip. She will be spending the 4th of July holiday at the beach and will be seen in 3 weeks instead of the usual 2 weeks. We will continue Coumadin 5mg  daily except 2.5mg  on Fridays. Recheck INR on 10/12/13  Lab at 10:00 am & Coumadin clinic at 10:15 am.  Theone Murdoch, PharmD

## 2013-09-22 ENCOUNTER — Other Ambulatory Visit: Payer: Self-pay | Admitting: Physician Assistant

## 2013-09-27 ENCOUNTER — Telehealth: Payer: Self-pay | Admitting: Oncology

## 2013-09-27 NOTE — Telephone Encounter (Signed)
, °

## 2013-10-05 ENCOUNTER — Other Ambulatory Visit: Payer: Medicare Other

## 2013-10-06 ENCOUNTER — Other Ambulatory Visit: Payer: Self-pay | Admitting: Cardiology

## 2013-10-11 ENCOUNTER — Other Ambulatory Visit: Payer: Self-pay | Admitting: Cardiology

## 2013-10-12 ENCOUNTER — Other Ambulatory Visit (HOSPITAL_BASED_OUTPATIENT_CLINIC_OR_DEPARTMENT_OTHER): Payer: Medicare Other

## 2013-10-12 ENCOUNTER — Ambulatory Visit (HOSPITAL_BASED_OUTPATIENT_CLINIC_OR_DEPARTMENT_OTHER): Payer: Medicare Other | Admitting: Pharmacist

## 2013-10-12 ENCOUNTER — Telehealth: Payer: Self-pay | Admitting: *Deleted

## 2013-10-12 ENCOUNTER — Other Ambulatory Visit: Payer: Self-pay | Admitting: Oncology

## 2013-10-12 DIAGNOSIS — D689 Coagulation defect, unspecified: Secondary | ICD-10-CM

## 2013-10-12 DIAGNOSIS — Z7901 Long term (current) use of anticoagulants: Secondary | ICD-10-CM

## 2013-10-12 DIAGNOSIS — Z954 Presence of other heart-valve replacement: Secondary | ICD-10-CM

## 2013-10-12 DIAGNOSIS — D509 Iron deficiency anemia, unspecified: Secondary | ICD-10-CM

## 2013-10-12 DIAGNOSIS — D500 Iron deficiency anemia secondary to blood loss (chronic): Secondary | ICD-10-CM

## 2013-10-12 DIAGNOSIS — I359 Nonrheumatic aortic valve disorder, unspecified: Secondary | ICD-10-CM

## 2013-10-12 DIAGNOSIS — R5383 Other fatigue: Secondary | ICD-10-CM

## 2013-10-12 DIAGNOSIS — R5381 Other malaise: Secondary | ICD-10-CM

## 2013-10-12 DIAGNOSIS — I4891 Unspecified atrial fibrillation: Secondary | ICD-10-CM

## 2013-10-12 LAB — CBC WITH DIFFERENTIAL/PLATELET
BASO%: 0.2 % (ref 0.0–2.0)
Basophils Absolute: 0 10*3/uL (ref 0.0–0.1)
EOS%: 2.1 % (ref 0.0–7.0)
Eosinophils Absolute: 0.1 10*3/uL (ref 0.0–0.5)
HCT: 33 % — ABNORMAL LOW (ref 34.8–46.6)
HGB: 10.4 g/dL — ABNORMAL LOW (ref 11.6–15.9)
LYMPH%: 17.2 % (ref 14.0–49.7)
MCH: 32.6 pg (ref 25.1–34.0)
MCHC: 31.5 g/dL (ref 31.5–36.0)
MCV: 103.4 fL — ABNORMAL HIGH (ref 79.5–101.0)
MONO#: 0.4 10*3/uL (ref 0.1–0.9)
MONO%: 8.5 % (ref 0.0–14.0)
NEUT#: 3.1 10*3/uL (ref 1.5–6.5)
NEUT%: 72 % (ref 38.4–76.8)
Platelets: 169 10*3/uL (ref 145–400)
RBC: 3.19 10*6/uL — AB (ref 3.70–5.45)
RDW: 14.8 % — AB (ref 11.2–14.5)
WBC: 4.4 10*3/uL (ref 3.9–10.3)
lymph#: 0.8 10*3/uL — ABNORMAL LOW (ref 0.9–3.3)

## 2013-10-12 LAB — PROTIME-INR
INR: 2.7 (ref 2.00–3.50)
PROTIME: 32.4 s — AB (ref 10.6–13.4)

## 2013-10-12 LAB — FERRITIN CHCC: Ferritin: 122 ng/ml (ref 9–269)

## 2013-10-12 LAB — POCT INR: INR: 2.7

## 2013-10-12 NOTE — Progress Notes (Signed)
INR = 2.7    Goal range 2.5-3 Patient states she has had dark stools for the last 10 days. No other bleeding/bruising noted. H/H has decreased to 10.4/33. She says she has been feeling tired and wonders if she needs a Feraheme infusion. I spoke with Dr. Jana Hakim, her ferritin level is pending and if her ferritin level is low she will receive iron tomorrow. He will go ahead and order Feraheme and we will cancel the infusion and appt if her Ferritin is normal. I will call her this afternoon to let her know if she is to come in tomorrow for an infusion. She will be going out of town to [place] for a long weekend and leaves this Thursday. She will have a ferritin level drawn again next month and it will be decided at that time if she needs Feraheme prior to her trip to [place] on 8/26. She will continue Coumadin 5mg  daily except 2.5mg  on Fridays. We will recheck INR on 10/26/13  Lab at 10:00 am & Coumadin clinic at 10:15 am.  Theone Murdoch, PharmD

## 2013-10-12 NOTE — Telephone Encounter (Signed)
Per pharmacy I have scheduled appt. Pharmacy to give pt appt

## 2013-10-13 ENCOUNTER — Ambulatory Visit (HOSPITAL_BASED_OUTPATIENT_CLINIC_OR_DEPARTMENT_OTHER): Payer: Medicare Other

## 2013-10-13 VITALS — BP 150/62 | HR 65 | Temp 97.4°F | Resp 18

## 2013-10-13 DIAGNOSIS — R5381 Other malaise: Secondary | ICD-10-CM

## 2013-10-13 DIAGNOSIS — D509 Iron deficiency anemia, unspecified: Secondary | ICD-10-CM

## 2013-10-13 DIAGNOSIS — R5383 Other fatigue: Principal | ICD-10-CM

## 2013-10-13 MED ORDER — SODIUM CHLORIDE 0.9 % IV SOLN
1020.0000 mg | Freq: Once | INTRAVENOUS | Status: AC
  Administered 2013-10-13: 1020 mg via INTRAVENOUS
  Filled 2013-10-13: qty 34

## 2013-10-13 NOTE — Progress Notes (Signed)
Pt reports dark to black stool x10 days; ferritin 122 on 10/12/13. Dr. Jana Hakim notified of the above, OK to proceed with [facility] today as ordered. Pt reports she saw Dr. Watt Climes last week.

## 2013-10-13 NOTE — Patient Instructions (Signed)

## 2013-10-19 ENCOUNTER — Other Ambulatory Visit: Payer: Medicare Other

## 2013-10-20 ENCOUNTER — Other Ambulatory Visit: Payer: Self-pay | Admitting: Oncology

## 2013-10-21 ENCOUNTER — Ambulatory Visit (HOSPITAL_BASED_OUTPATIENT_CLINIC_OR_DEPARTMENT_OTHER): Payer: Medicare Other | Admitting: Pharmacist

## 2013-10-21 ENCOUNTER — Ambulatory Visit (INDEPENDENT_AMBULATORY_CARE_PROVIDER_SITE_OTHER): Payer: Medicare Other | Admitting: Cardiology

## 2013-10-21 ENCOUNTER — Telehealth: Payer: Self-pay | Admitting: Oncology

## 2013-10-21 ENCOUNTER — Encounter: Payer: Self-pay | Admitting: Cardiology

## 2013-10-21 ENCOUNTER — Other Ambulatory Visit (HOSPITAL_BASED_OUTPATIENT_CLINIC_OR_DEPARTMENT_OTHER): Payer: Medicare Other

## 2013-10-21 VITALS — BP 152/80 | HR 61 | Ht 60.0 in | Wt 126.6 lb

## 2013-10-21 DIAGNOSIS — D689 Coagulation defect, unspecified: Secondary | ICD-10-CM

## 2013-10-21 DIAGNOSIS — I482 Chronic atrial fibrillation, unspecified: Secondary | ICD-10-CM

## 2013-10-21 DIAGNOSIS — I119 Hypertensive heart disease without heart failure: Secondary | ICD-10-CM

## 2013-10-21 DIAGNOSIS — Z7901 Long term (current) use of anticoagulants: Secondary | ICD-10-CM

## 2013-10-21 DIAGNOSIS — Z954 Presence of other heart-valve replacement: Secondary | ICD-10-CM

## 2013-10-21 DIAGNOSIS — I4891 Unspecified atrial fibrillation: Secondary | ICD-10-CM

## 2013-10-21 DIAGNOSIS — I359 Nonrheumatic aortic valve disorder, unspecified: Secondary | ICD-10-CM

## 2013-10-21 DIAGNOSIS — E7800 Pure hypercholesterolemia, unspecified: Secondary | ICD-10-CM

## 2013-10-21 LAB — CBC WITH DIFFERENTIAL/PLATELET
BASO%: 0.2 % (ref 0.0–2.0)
Basophils Absolute: 0 10*3/uL (ref 0.0–0.1)
EOS%: 1.9 % (ref 0.0–7.0)
Eosinophils Absolute: 0.1 10*3/uL (ref 0.0–0.5)
HCT: 32.8 % — ABNORMAL LOW (ref 34.8–46.6)
HGB: 10.6 g/dL — ABNORMAL LOW (ref 11.6–15.9)
LYMPH#: 0.8 10*3/uL — AB (ref 0.9–3.3)
LYMPH%: 18.2 % (ref 14.0–49.7)
MCH: 33.2 pg (ref 25.1–34.0)
MCHC: 32.3 g/dL (ref 31.5–36.0)
MCV: 102.8 fL — AB (ref 79.5–101.0)
MONO#: 0.3 10*3/uL (ref 0.1–0.9)
MONO%: 6 % (ref 0.0–14.0)
NEUT#: 3.1 10*3/uL (ref 1.5–6.5)
NEUT%: 73.7 % (ref 38.4–76.8)
Platelets: 173 10*3/uL (ref 145–400)
RBC: 3.19 10*6/uL — ABNORMAL LOW (ref 3.70–5.45)
RDW: 15.3 % — ABNORMAL HIGH (ref 11.2–14.5)
WBC: 4.2 10*3/uL (ref 3.9–10.3)
nRBC: 0 % (ref 0–0)

## 2013-10-21 LAB — PROTIME-INR
INR: 3.3 (ref 2.00–3.50)
Protime: 39.6 Seconds — ABNORMAL HIGH (ref 10.6–13.4)

## 2013-10-21 LAB — POCT INR: INR: 3.3

## 2013-10-21 NOTE — Progress Notes (Signed)
[name] Date of Birth:  [birth year] [facility] [address] [place], [place]  [address] [phone]        Fax   [phone]   History of Present Illness: This pleasant 80 year old woman is seen back for a scheduled followup office visit. . She has a history of previous mechanical aortic valve replacement in 1996 and she has a history of chronic atrial fibrillation and is on Coumadin. She does not have any ischemic heart disease and she had a normal nuclear stress test in March 2012. She has not been experiencing any new cardiac symptoms. She does have a past history of high blood pressure. The patient has had significant GI problems and is followed closely by Dr. Watt Climes. She is on iron and her hemoglobin has been improving slowly and she has not been aware of any recent hematochezia. Her stools are chronically dark. Since last visit her energy level has improved slightly and she has had no new cardiac symptoms. She has a past history of iron deficiency anemia and is seeing Dr. Griffith Citron who plans to give her intravenous iron if her hemoglobin drops below 10.0.   Current Outpatient Prescriptions  Medication Sig Dispense Refill  . allopurinol (ZYLOPRIM) 300 MG tablet Take 300 mg by mouth daily.      Marland Kitchen allopurinol (ZYLOPRIM) 300 MG tablet TAKE ONE TABLET EACH DAY  30 tablet  3  . calcium-vitamin D 500 MG tablet Take 1 tablet by mouth 2 (two) times daily.        . celecoxib (CELEBREX) 200 MG capsule Take 200 mg by mouth daily.      Marland Kitchen COUMADIN 5 MG tablet TAKE ONE TABLET EACH DAY OR TAKE AS DIRECTED BY COUMADIN CLINIC  30 tablet  6  . dicyclomine (BENTYL) 10 MG capsule Take 10 mg by mouth as needed. For stomach cramps      . fexofenadine (ALLEGRA ALLERGY) 180 MG tablet Take 180 mg by mouth daily. For allergies. As needed      . furosemide (LASIX) 40 MG tablet Take 20-40 mg by mouth daily as needed for fluid.      Marland Kitchen LANOXIN 250 MCG tablet TAKE ONE-HALF TABLET DAILY   30 tablet  3  . lansoprazole (PREVACID) 30 MG capsule Take 30 mg by mouth daily.        Marland Kitchen levothyroxine (SYNTHROID, LEVOTHROID) 88 MCG tablet Take 88 mcg by mouth daily before breakfast.      . metoprolol tartrate (LOPRESSOR) 25 MG tablet Take 12.5-25 mg by mouth 2 (two) times daily. Take 0.5 tablet in AM and 1 tablet in PM      . metoprolol tartrate (LOPRESSOR) 25 MG tablet TAKE ONE-HALF TABLET IN THE MORNING AND TAKE ONE TABLET IN THE EVENING  135 tablet  0  . multivitamin (THERAGRAN) per tablet Take 1 tablet by mouth daily.       . Naphazoline-Pheniramine (OPCON-A) 0.027-0.315 % SOLN Apply 1 drop to eye 2 (two) times daily as needed (itchy eyes).      . nitroGLYCERIN (NITROSTAT) 0.4 MG SL tablet Place 0.4 mg under the tongue every 5 (five) minutes as needed for chest pain.      . Omega-3 Fatty Acids (FISH OIL) 1000 MG CAPS Take 1,000 mg by mouth 2 (two) times daily.       . potassium chloride SA (K-DUR,KLOR-CON) 20 MEQ tablet Take 20 mEq by mouth daily.      . potassium chloride SA (  K-DUR,KLOR-CON) 20 MEQ tablet TAKE ONE TABLET EACH DAY  30 tablet  0  . simvastatin (ZOCOR) 20 MG tablet Take 10 mg by mouth daily.      Marland Kitchen SYNTHROID 88 MCG tablet TAKE ONE TABLET EACH DAY  30 tablet  2  . TRIBENZOR 40-5-12.5 MG TABS TAKE ONE TABLET EVERY DAY  30 tablet  0  . zolpidem (AMBIEN CR) 12.5 MG CR tablet TAKE ONE TABLET AT BEDTIME AS NEEDED  30 tablet  5   No current facility-administered medications for this visit.    Allergies  Allergen Reactions  . Amiodarone Shortness Of Breath  . Alendronate Sodium     GI upset  . Macrodantin [Nitrofurantoin]     GI upset  . Meclizine Swelling    tongue  . Norvasc [Amlodipine Besylate]     edema  . Tussionex Pennkinetic Er [Hydrocod Polst-Cpm Polst Er] Other (See Comments)    Reaction unknown    Patient Active Problem List   Diagnosis Date Noted  . S/P aortic valve replacement with metallic valve 22/05/5425    Priority: Medium  . Melena 05/19/2013    . Coagulopathy 01/27/2013  . Iron deficiency anemia secondary to blood loss (chronic) 07/24/2012  . Malaise and fatigue 05/21/2012  . Dyslipidemia 12/04/2011  . Atrial fibrillation 09/06/2011  . Encounter for long-term (current) use of anticoagulants 08/08/2011  . Nonsustained ventricular tachycardia 07/21/2011  . Hypokalemia 07/20/2011  . Hyponatremia 07/20/2011  . Bronchitis 07/20/2011  . Hypothyroidism   . Benign hypertensive heart disease without heart failure     History  Smoking status  . Former Smoker -- 1.00 packs/day for 20 years  . Types: Cigarettes  . Quit date: 04/01/1988  Smokeless tobacco  . Never Used    History  Alcohol Use  . 4.8 oz/week  . 4 Glasses of wine, 4 Shots of liquor per week    Comment: burbon or glass wine daily    Family History  Problem Relation Age of Onset  . Dementia Mother   . Coronary artery disease Father     Review of Systems: Constitutional: no fever chills diaphoresis or fatigue or change in weight.  Head and neck: no hearing loss, no epistaxis, no photophobia or visual disturbance. Respiratory: No cough, shortness of breath or wheezing. Cardiovascular: No chest pain peripheral edema, palpitations. Gastrointestinal: No abdominal distention, no abdominal pain, no change in bowel habits hematochezia or melena. Genitourinary: No dysuria, no frequency, no urgency, no nocturia. Musculoskeletal:No arthralgias, no back pain, no gait disturbance or myalgias. Neurological: No dizziness, no headaches, no numbness, no seizures, no syncope, no weakness, no tremors. Hematologic: No lymphadenopathy, no easy bruising. Psychiatric: No confusion, no hallucinations, no sleep disturbance.    Physical Exam: Filed Vitals:   10/21/13 1201  BP: 152/80  Pulse:    the general appearance reveals a well-developed well-nourished elderly woman in no distress.The head and neck exam reveals pupils equal and reactive.  Extraocular movements are full.   There is no scleral icterus.  The mouth and pharynx are normal.  The neck is supple.  The carotids reveal no bruits.  The jugular venous pressure is normal.  The  thyroid is not enlarged.  There is no lymphadenopathy.  The chest is clear to percussion and auscultation.  There are no rales or rhonchi.  Expansion of the chest is symmetrical.  The precordium is quiet.  The pulse is irregularly irregular The first heart sound is normal.  The second heart sound is a mechanical  aortic closure sound is crisp.  There is no diastolic murmur.  There is a soft systolic murmur across the prosthetic aortic valve.  There is no abnormal lift or heave.  The abdomen is soft and nontender.  The bowel sounds are normal.  The liver and spleen are not enlarged.  There are no abdominal masses.  There are no abdominal bruits.  Extremities reveal good pedal pulses.  There is trace edema.  There is no cyanosis or clubbing.  Strength is normal and symmetrical in all extremities.  There is no lateralizing weakness.  There are no sensory deficits.  The skin is warm and dry.  There is no rash.     Assessment / Plan: 1.  Mechanical aortic valve replacement in 1996. 2. essential hypertension 3. chronic atrial fibrillation on Coumadin 4. ongoing occult GI blood loss with iron deficiency anemia, followed by Dr. Watt Climes and Dr. Jana Hakim 5.  Hypothyroidism 6. Hypercholesterolemia  Plan: Continue same medication.  Monitor blood pressure at home and report back if it remains elevated.  She is planning for a nice birthday trip to [place] for herself and her family in late August.  She will turn 62. Recheck here in 4 months for office visit.  She had recent lab work including cholesterol done in [place] 4 months ago.  It is scanned in.

## 2013-10-21 NOTE — Assessment & Plan Note (Signed)
The patient is in permanent atrial fibrillation.  She has not been aware of any racing of her heart or palpitations.  She has had no TIA symptoms.

## 2013-10-21 NOTE — Patient Instructions (Signed)
Your physician recommends that you continue on your current medications as directed. Please refer to the Current Medication list given to you today.  Your physician recommends that you schedule a follow-up appointment in: 4 month ov  

## 2013-10-21 NOTE — Telephone Encounter (Signed)
per Melissa in [place] to sch CC for 10:15-added

## 2013-10-21 NOTE — Patient Instructions (Signed)
INR slightly above goal Take 2.5 mg (half tablet) tonight then Continue Coumadin 5mg  daily except 2.5mg  on Fridays. Recheck INR on 11/02/13  Lab at 10:30 am & Coumadin clinic at 10:45 am.

## 2013-10-21 NOTE — Assessment & Plan Note (Signed)
She continues to do well in regard to her prosthetic aortic valve.  No symptoms of CHF.  No TIA symptoms.

## 2013-10-21 NOTE — Telephone Encounter (Signed)
add cc per Chris-pt aware

## 2013-10-21 NOTE — Progress Notes (Signed)
INR slightly above goal today Pt is doing well with no complaints CBC is stable after receiving feraheme last week Pt still with complaints of dark stools over the last few days/weeks (this has been a chronic issue) Hemoglobin is stable at 10.6 today and platelets are 173k Pt reports no missed or extra doses No other bleeding or bruising besides her dark stools No medication or diet changes Pt states she will eat some more greens this week since her INR is elevated Plan: Take 2.5 mg (half tablet) tonight then Continue Coumadin 5mg  daily except 2.5mg  on Fridays. Recheck INR on 11/02/13  Lab at 10:30 am & Coumadin clinic at 10:45 am.

## 2013-10-21 NOTE — Assessment & Plan Note (Signed)
Patient has a history of essential hypertension.  She has not been checking her blood pressure at home recently.  Her blood pressure is a little high today.  He is not having any symptoms from her high blood pressure.  She will monitor it at home and let us know if it remains elevated.  She is on daily Celebrex which may be contributing to her hypertension but she states that she cannot do without her Celebrex.

## 2013-11-02 ENCOUNTER — Other Ambulatory Visit: Payer: Self-pay | Admitting: Cardiology

## 2013-11-02 ENCOUNTER — Ambulatory Visit (HOSPITAL_BASED_OUTPATIENT_CLINIC_OR_DEPARTMENT_OTHER): Payer: Self-pay | Admitting: Pharmacist

## 2013-11-02 ENCOUNTER — Other Ambulatory Visit (HOSPITAL_BASED_OUTPATIENT_CLINIC_OR_DEPARTMENT_OTHER): Payer: Medicare Other

## 2013-11-02 DIAGNOSIS — D689 Coagulation defect, unspecified: Secondary | ICD-10-CM

## 2013-11-02 DIAGNOSIS — I359 Nonrheumatic aortic valve disorder, unspecified: Secondary | ICD-10-CM

## 2013-11-02 DIAGNOSIS — I4891 Unspecified atrial fibrillation: Secondary | ICD-10-CM

## 2013-11-02 DIAGNOSIS — Z7901 Long term (current) use of anticoagulants: Secondary | ICD-10-CM

## 2013-11-02 DIAGNOSIS — Z954 Presence of other heart-valve replacement: Secondary | ICD-10-CM

## 2013-11-02 LAB — CBC WITH DIFFERENTIAL/PLATELET
BASO%: 0.3 % (ref 0.0–2.0)
Basophils Absolute: 0 10*3/uL (ref 0.0–0.1)
EOS%: 2.3 % (ref 0.0–7.0)
Eosinophils Absolute: 0.1 10*3/uL (ref 0.0–0.5)
HEMATOCRIT: 34.7 % — AB (ref 34.8–46.6)
HGB: 11.4 g/dL — ABNORMAL LOW (ref 11.6–15.9)
LYMPH%: 15.8 % (ref 14.0–49.7)
MCH: 33.6 pg (ref 25.1–34.0)
MCHC: 32.9 g/dL (ref 31.5–36.0)
MCV: 102.4 fL — ABNORMAL HIGH (ref 79.5–101.0)
MONO#: 0.4 10*3/uL (ref 0.1–0.9)
MONO%: 9 % (ref 0.0–14.0)
NEUT#: 2.8 10*3/uL (ref 1.5–6.5)
NEUT%: 72.6 % (ref 38.4–76.8)
PLATELETS: 162 10*3/uL (ref 145–400)
RBC: 3.39 10*6/uL — AB (ref 3.70–5.45)
RDW: 14.7 % — ABNORMAL HIGH (ref 11.2–14.5)
WBC: 3.9 10*3/uL (ref 3.9–10.3)
lymph#: 0.6 10*3/uL — ABNORMAL LOW (ref 0.9–3.3)
nRBC: 0 % (ref 0–0)

## 2013-11-02 LAB — PROTIME-INR
INR: 2.7 (ref 2.00–3.50)
Protime: 32.4 Seconds — ABNORMAL HIGH (ref 10.6–13.4)

## 2013-11-02 LAB — POCT INR: INR: 2.7

## 2013-11-02 NOTE — Patient Instructions (Signed)
INR at goal No changes Continue Coumadin 5mg  daily except 2.5mg  on Fridays. Recheck INR on 11/16/13  Lab at 10:30 am & Coumadin clinic at 10:45 am.

## 2013-11-02 NOTE — Progress Notes (Signed)
INR at goal Pt is doing well toady She does report feeling some arthritis in her joints but over all she is doing good Her Hgb is up today to 11.4 She still reports black stools as this has been a chronic issue No other unusual bleeding or bruising No missed or extra doses of coumadin No medication or diet changes Pt will return in 2 weeks for labs and coumadin clinic (this is one week prior to her trip to [place]) We will see if she will need any iron prior to this trip Plan: No changes Continue Coumadin 5mg  daily except 2.5mg  on Fridays. Recheck INR on 11/16/13  Lab at 10:30 am & Coumadin clinic at 10:45 am.

## 2013-11-09 ENCOUNTER — Other Ambulatory Visit: Payer: Self-pay | Admitting: Cardiology

## 2013-11-15 ENCOUNTER — Other Ambulatory Visit: Payer: Self-pay | Admitting: Cardiology

## 2013-11-16 ENCOUNTER — Telehealth: Payer: Self-pay | Admitting: Oncology

## 2013-11-16 ENCOUNTER — Other Ambulatory Visit (HOSPITAL_BASED_OUTPATIENT_CLINIC_OR_DEPARTMENT_OTHER): Payer: Medicare Other

## 2013-11-16 ENCOUNTER — Ambulatory Visit (HOSPITAL_BASED_OUTPATIENT_CLINIC_OR_DEPARTMENT_OTHER): Payer: Medicare Other | Admitting: Pharmacist

## 2013-11-16 DIAGNOSIS — Z7901 Long term (current) use of anticoagulants: Secondary | ICD-10-CM

## 2013-11-16 DIAGNOSIS — I359 Nonrheumatic aortic valve disorder, unspecified: Secondary | ICD-10-CM

## 2013-11-16 DIAGNOSIS — D500 Iron deficiency anemia secondary to blood loss (chronic): Secondary | ICD-10-CM

## 2013-11-16 DIAGNOSIS — R5381 Other malaise: Secondary | ICD-10-CM

## 2013-11-16 DIAGNOSIS — D689 Coagulation defect, unspecified: Secondary | ICD-10-CM

## 2013-11-16 DIAGNOSIS — I4891 Unspecified atrial fibrillation: Secondary | ICD-10-CM

## 2013-11-16 DIAGNOSIS — R5383 Other fatigue: Secondary | ICD-10-CM

## 2013-11-16 DIAGNOSIS — Z954 Presence of other heart-valve replacement: Secondary | ICD-10-CM

## 2013-11-16 LAB — CBC WITH DIFFERENTIAL/PLATELET
BASO%: 0.4 % (ref 0.0–2.0)
BASOS ABS: 0 10*3/uL (ref 0.0–0.1)
EOS%: 1.2 % (ref 0.0–7.0)
Eosinophils Absolute: 0 10*3/uL (ref 0.0–0.5)
HEMATOCRIT: 36.3 % (ref 34.8–46.6)
HEMOGLOBIN: 11.7 g/dL (ref 11.6–15.9)
LYMPH#: 0.6 10*3/uL — AB (ref 0.9–3.3)
LYMPH%: 15.7 % (ref 14.0–49.7)
MCH: 33.5 pg (ref 25.1–34.0)
MCHC: 32.3 g/dL (ref 31.5–36.0)
MCV: 103.7 fL — ABNORMAL HIGH (ref 79.5–101.0)
MONO#: 0.3 10*3/uL (ref 0.1–0.9)
MONO%: 7.6 % (ref 0.0–14.0)
NEUT#: 3 10*3/uL (ref 1.5–6.5)
NEUT%: 75.1 % (ref 38.4–76.8)
PLATELETS: 174 10*3/uL (ref 145–400)
RBC: 3.5 10*6/uL — ABNORMAL LOW (ref 3.70–5.45)
RDW: 14.8 % — ABNORMAL HIGH (ref 11.2–14.5)
WBC: 4 10*3/uL (ref 3.9–10.3)

## 2013-11-16 LAB — PROTIME-INR
INR: 3.3 (ref 2.00–3.50)
Protime: 39.6 Seconds — ABNORMAL HIGH (ref 10.6–13.4)

## 2013-11-16 LAB — POCT INR: INR: 3.3

## 2013-11-16 LAB — FERRITIN CHCC: Ferritin: 186 ng/ml (ref 9–269)

## 2013-11-16 NOTE — Telephone Encounter (Signed)
per Chris to sch CC-pt aware °

## 2013-11-16 NOTE — Progress Notes (Signed)
Pt seen in clinic today INR=3.3 on 5mg  daily with 2.5 mg on Fri  No changes to report No new meds She is heading to [place] in a week for 10+ days with her kids and their spouses  Pt will take half dose today at and resume her normal dose as stated above. RTC on 12/08/13 at 10:45 for lab and 11am for CC

## 2013-11-16 NOTE — Patient Instructions (Signed)
Take 2.5 mg today then continue Coumadin 5mg  daily except 2.5mg  on Fridays. Recheck INR on 12/08/13  Lab at 10:45 am & Coumadin clinic at 11:00 am

## 2013-11-19 ENCOUNTER — Other Ambulatory Visit: Payer: Self-pay | Admitting: *Deleted

## 2013-11-19 DIAGNOSIS — K921 Melena: Secondary | ICD-10-CM

## 2013-11-19 DIAGNOSIS — D500 Iron deficiency anemia secondary to blood loss (chronic): Secondary | ICD-10-CM

## 2013-11-22 ENCOUNTER — Other Ambulatory Visit (HOSPITAL_BASED_OUTPATIENT_CLINIC_OR_DEPARTMENT_OTHER): Payer: Medicare Other

## 2013-11-22 ENCOUNTER — Telehealth: Payer: Self-pay | Admitting: Oncology

## 2013-11-22 DIAGNOSIS — Z7901 Long term (current) use of anticoagulants: Secondary | ICD-10-CM

## 2013-11-22 DIAGNOSIS — D500 Iron deficiency anemia secondary to blood loss (chronic): Secondary | ICD-10-CM

## 2013-11-22 DIAGNOSIS — K921 Melena: Secondary | ICD-10-CM

## 2013-11-22 DIAGNOSIS — D689 Coagulation defect, unspecified: Secondary | ICD-10-CM

## 2013-11-22 LAB — CBC WITH DIFFERENTIAL/PLATELET
BASO%: 0.5 % (ref 0.0–2.0)
Basophils Absolute: 0 10*3/uL (ref 0.0–0.1)
EOS ABS: 0.1 10*3/uL (ref 0.0–0.5)
EOS%: 1.4 % (ref 0.0–7.0)
HCT: 34.9 % (ref 34.8–46.6)
HGB: 11.4 g/dL — ABNORMAL LOW (ref 11.6–15.9)
LYMPH%: 16 % (ref 14.0–49.7)
MCH: 33.9 pg (ref 25.1–34.0)
MCHC: 32.8 g/dL (ref 31.5–36.0)
MCV: 103.4 fL — AB (ref 79.5–101.0)
MONO#: 0.4 10*3/uL (ref 0.1–0.9)
MONO%: 9.3 % (ref 0.0–14.0)
NEUT%: 72.8 % (ref 38.4–76.8)
NEUTROS ABS: 2.8 10*3/uL (ref 1.5–6.5)
Platelets: 181 10*3/uL (ref 145–400)
RBC: 3.38 10*6/uL — AB (ref 3.70–5.45)
RDW: 14.7 % — ABNORMAL HIGH (ref 11.2–14.5)
WBC: 3.8 10*3/uL — ABNORMAL LOW (ref 3.9–10.3)
lymph#: 0.6 10*3/uL — ABNORMAL LOW (ref 0.9–3.3)

## 2013-11-22 LAB — PROTIME-INR
INR: 2.3 (ref 2.00–3.50)
Protime: 27.6 Seconds — ABNORMAL HIGH (ref 10.6–13.4)

## 2013-11-22 LAB — FERRITIN CHCC: FERRITIN: 148 ng/mL (ref 9–269)

## 2013-11-22 NOTE — Telephone Encounter (Signed)
per Ginna cc to sch lab & CC-CX lab 9/1-pt aware

## 2013-11-30 ENCOUNTER — Other Ambulatory Visit: Payer: Medicare Other

## 2013-12-08 ENCOUNTER — Ambulatory Visit (HOSPITAL_BASED_OUTPATIENT_CLINIC_OR_DEPARTMENT_OTHER): Payer: Medicare Other

## 2013-12-08 ENCOUNTER — Other Ambulatory Visit (HOSPITAL_BASED_OUTPATIENT_CLINIC_OR_DEPARTMENT_OTHER): Payer: Medicare Other

## 2013-12-08 DIAGNOSIS — Z7901 Long term (current) use of anticoagulants: Secondary | ICD-10-CM

## 2013-12-08 DIAGNOSIS — I4891 Unspecified atrial fibrillation: Secondary | ICD-10-CM

## 2013-12-08 DIAGNOSIS — I359 Nonrheumatic aortic valve disorder, unspecified: Secondary | ICD-10-CM

## 2013-12-08 DIAGNOSIS — Z954 Presence of other heart-valve replacement: Secondary | ICD-10-CM

## 2013-12-08 DIAGNOSIS — D689 Coagulation defect, unspecified: Secondary | ICD-10-CM

## 2013-12-08 LAB — PROTIME-INR
INR: 2.9 (ref 2.00–3.50)
Protime: 34.8 Seconds — ABNORMAL HIGH (ref 10.6–13.4)

## 2013-12-08 LAB — CBC WITH DIFFERENTIAL/PLATELET
BASO%: 0.5 % (ref 0.0–2.0)
Basophils Absolute: 0 10*3/uL (ref 0.0–0.1)
EOS ABS: 0.1 10*3/uL (ref 0.0–0.5)
EOS%: 1.2 % (ref 0.0–7.0)
HEMATOCRIT: 30.9 % — AB (ref 34.8–46.6)
HGB: 9.9 g/dL — ABNORMAL LOW (ref 11.6–15.9)
LYMPH#: 0.9 10*3/uL (ref 0.9–3.3)
LYMPH%: 16.5 % (ref 14.0–49.7)
MCH: 33.3 pg (ref 25.1–34.0)
MCHC: 32 g/dL (ref 31.5–36.0)
MCV: 104 fL — ABNORMAL HIGH (ref 79.5–101.0)
MONO#: 0.6 10*3/uL (ref 0.1–0.9)
MONO%: 11 % (ref 0.0–14.0)
NEUT%: 70.8 % (ref 38.4–76.8)
NEUTROS ABS: 4 10*3/uL (ref 1.5–6.5)
Platelets: 216 10*3/uL (ref 145–400)
RBC: 2.97 10*6/uL — ABNORMAL LOW (ref 3.70–5.45)
RDW: 14.7 % — ABNORMAL HIGH (ref 11.2–14.5)
WBC: 5.6 10*3/uL (ref 3.9–10.3)
nRBC: 0 % (ref 0–0)

## 2013-12-08 LAB — POCT INR: INR: 2.9

## 2013-12-08 NOTE — Progress Notes (Signed)
INR 2.9 today, which is within goal range of 2.5-3.0 Deborah [name] denies any missed and/or extra Coumadin doses, and states her only medication change is that she took a Z-pack for 5 days starting ~ 8/29 and ending ~ 9/2.  No recent major diet changes, perhaps slightly more wine intake when she recently went to [place].  Deborah [name] does endorse some black tarry stools, though she says this is somewhat of a normal occurrence with her higher INR goal. She inquired about her hemoglobin level, as she has Deborah [name] increasingly short of breath and tired. Her hemoglobin was 9.9 today, and she states that Deborah [name] usually orders IV iron when her hemoglobin is <10. We will contact Deborah [name] to inform him of her low hemoglobin.   Plan: Continue Coumadin 5 mg by mouth daily, except 2.5 mg on Fridays. Return on 9/29 for an INR check: lab at 10:30, CC at 10:45.

## 2013-12-08 NOTE — Patient Instructions (Addendum)
Continue Coumadin 5mg  daily except 2.5mg  on Fridays. Recheck INR on 12/28/13  Lab at 10:30 am & Coumadin clinic at 10:45 am.

## 2013-12-13 ENCOUNTER — Ambulatory Visit (HOSPITAL_BASED_OUTPATIENT_CLINIC_OR_DEPARTMENT_OTHER): Payer: Medicare Other

## 2013-12-13 VITALS — BP 122/57 | HR 73 | Temp 97.9°F | Resp 17

## 2013-12-13 DIAGNOSIS — D509 Iron deficiency anemia, unspecified: Secondary | ICD-10-CM

## 2013-12-13 DIAGNOSIS — R5383 Other fatigue: Principal | ICD-10-CM

## 2013-12-13 DIAGNOSIS — R5381 Other malaise: Secondary | ICD-10-CM

## 2013-12-13 MED ORDER — SODIUM CHLORIDE 0.9 % IV SOLN
1020.0000 mg | Freq: Once | INTRAVENOUS | Status: AC
  Administered 2013-12-13: 1020 mg via INTRAVENOUS
  Filled 2013-12-13: qty 34

## 2013-12-13 NOTE — Patient Instructions (Signed)

## 2013-12-13 NOTE — Progress Notes (Signed)
Patient decline 30 minute post observation period.

## 2013-12-14 ENCOUNTER — Other Ambulatory Visit: Payer: Medicare Other

## 2013-12-22 ENCOUNTER — Telehealth: Payer: Self-pay | Admitting: Oncology

## 2013-12-22 NOTE — Telephone Encounter (Signed)
per [facility] to add CC for pt-pt aware

## 2013-12-28 ENCOUNTER — Other Ambulatory Visit (HOSPITAL_BASED_OUTPATIENT_CLINIC_OR_DEPARTMENT_OTHER): Payer: Medicare Other

## 2013-12-28 ENCOUNTER — Ambulatory Visit (HOSPITAL_BASED_OUTPATIENT_CLINIC_OR_DEPARTMENT_OTHER): Payer: Medicare Other | Admitting: Pharmacist

## 2013-12-28 DIAGNOSIS — D689 Coagulation defect, unspecified: Secondary | ICD-10-CM

## 2013-12-28 DIAGNOSIS — Z7901 Long term (current) use of anticoagulants: Secondary | ICD-10-CM

## 2013-12-28 DIAGNOSIS — Z954 Presence of other heart-valve replacement: Secondary | ICD-10-CM

## 2013-12-28 DIAGNOSIS — I4891 Unspecified atrial fibrillation: Secondary | ICD-10-CM

## 2013-12-28 DIAGNOSIS — D500 Iron deficiency anemia secondary to blood loss (chronic): Secondary | ICD-10-CM

## 2013-12-28 DIAGNOSIS — R5383 Other fatigue: Secondary | ICD-10-CM

## 2013-12-28 DIAGNOSIS — I359 Nonrheumatic aortic valve disorder, unspecified: Secondary | ICD-10-CM

## 2013-12-28 DIAGNOSIS — R5381 Other malaise: Secondary | ICD-10-CM

## 2013-12-28 LAB — CBC WITH DIFFERENTIAL/PLATELET
BASO%: 0.2 % (ref 0.0–2.0)
Basophils Absolute: 0 10*3/uL (ref 0.0–0.1)
EOS%: 1.1 % (ref 0.0–7.0)
Eosinophils Absolute: 0.1 10*3/uL (ref 0.0–0.5)
HCT: 30.3 % — ABNORMAL LOW (ref 34.8–46.6)
HGB: 9.6 g/dL — ABNORMAL LOW (ref 11.6–15.9)
LYMPH#: 0.5 10*3/uL — AB (ref 0.9–3.3)
LYMPH%: 12 % — ABNORMAL LOW (ref 14.0–49.7)
MCH: 33.4 pg (ref 25.1–34.0)
MCHC: 31.7 g/dL (ref 31.5–36.0)
MCV: 105.6 fL — ABNORMAL HIGH (ref 79.5–101.0)
MONO#: 0.2 10*3/uL (ref 0.1–0.9)
MONO%: 5.3 % (ref 0.0–14.0)
NEUT#: 3.7 10*3/uL (ref 1.5–6.5)
NEUT%: 81.4 % — ABNORMAL HIGH (ref 38.4–76.8)
PLATELETS: 150 10*3/uL (ref 145–400)
RBC: 2.87 10*6/uL — AB (ref 3.70–5.45)
RDW: 16.1 % — AB (ref 11.2–14.5)
WBC: 4.5 10*3/uL (ref 3.9–10.3)

## 2013-12-28 LAB — PROTIME-INR
INR: 3.6 — ABNORMAL HIGH (ref 2.00–3.50)
PROTIME: 43.2 s — AB (ref 10.6–13.4)

## 2013-12-28 LAB — FERRITIN CHCC: Ferritin: 346 ng/ml — ABNORMAL HIGH (ref 9–269)

## 2013-12-28 LAB — POCT INR: INR: 3.6

## 2013-12-28 NOTE — Patient Instructions (Signed)
Hold coumadin today (12/28/13).   On 12/29/13, continue Coumadin 5mg  daily except 2.5mg  on Fridays.   Recheck INR on 01/10/14:  lab at 10:30am & Coumadin clinic at 10:45am

## 2013-12-28 NOTE — Progress Notes (Signed)
INR slightly above goal today. Goal INR = 2.5-3. Hg: 9.6 today.  Hg on 12/08/13 = 9.9 Received Feraheme 2 weeks ago on 12/13/13. Pt took a Z-pak about 1 month ago. No missed or extra coumadin doses. Pt may be eating less due to upset/uneasy stomach.  She is not as hungry. No s/s of clotting noted.  Usual bruising.  Black stools. Continued blood in stool nearly every day since her last visit. Pt is tired.  She is leaving 10/13 for [place] for her grandson's wedding on 10/17. Will notify Dr. Jana Hakim of above findings and today's CBC results for further orders. Hold coumadin today (12/28/13).   On 12/29/13, continue Coumadin 5mg  daily except 2.5mg  on Fridays.   Recheck INR on 01/10/14:  lab at 10:30am & Coumadin clinic at 10:45am

## 2013-12-29 ENCOUNTER — Telehealth: Payer: Self-pay | Admitting: Oncology

## 2013-12-29 NOTE — Telephone Encounter (Signed)
per Theadora Rama to sch CC-pt aware r/s

## 2014-01-03 ENCOUNTER — Other Ambulatory Visit: Payer: Self-pay | Admitting: Cardiology

## 2014-01-03 ENCOUNTER — Telehealth: Payer: Self-pay | Admitting: Pharmacist

## 2014-01-03 NOTE — Telephone Encounter (Signed)
Pt called [facility] stating she "feels bad" and would like to have her CBC & INR checked this week. I have arranged for her to come tomorrow (10/6) at 11:30 am for lab; 11:45 am for Coumadin clinic.  We'll need her to stay to confirm CBC results before she leaves in case she needs a blood transfusion. Kennith Center, Pharm.D., CPP 01/03/2014@12 :42 PM

## 2014-01-03 NOTE — Telephone Encounter (Signed)
Pt called [facility] to inform us she was going to be out of town for her grandson's wedding from 10/14 through 10/25 so she would not be able to come for any appts here during that time. She understands she already has a lab/Coumadin clinic appt for 01/10/14 & after she is seen on 01/04/14, we can decide if she needs to keep that appt. Kennith Center, Pharm.D., CPP 01/03/2014@4 :10 PM

## 2014-01-04 ENCOUNTER — Ambulatory Visit (HOSPITAL_COMMUNITY)
Admission: RE | Admit: 2014-01-04 | Discharge: 2014-01-04 | Disposition: A | Discharge status: Home / Self Care | Payer: Medicare Other | Source: Ambulatory Visit | Attending: Oncology | Admitting: Oncology

## 2014-01-04 ENCOUNTER — Other Ambulatory Visit (HOSPITAL_BASED_OUTPATIENT_CLINIC_OR_DEPARTMENT_OTHER): Payer: Medicare Other

## 2014-01-04 ENCOUNTER — Non-Acute Institutional Stay (HOSPITAL_COMMUNITY)
Admission: AD | Admit: 2014-01-04 | Discharge: 2014-01-04 | Disposition: A | Discharge status: Home / Self Care | Payer: Medicare Other | Source: Ambulatory Visit | Attending: Oncology | Admitting: Oncology

## 2014-01-04 ENCOUNTER — Ambulatory Visit: Payer: Medicare Other

## 2014-01-04 ENCOUNTER — Other Ambulatory Visit: Payer: Self-pay | Admitting: *Deleted

## 2014-01-04 ENCOUNTER — Ambulatory Visit (HOSPITAL_BASED_OUTPATIENT_CLINIC_OR_DEPARTMENT_OTHER): Payer: Medicare Other | Admitting: Pharmacist

## 2014-01-04 DIAGNOSIS — D508 Other iron deficiency anemias: Secondary | ICD-10-CM

## 2014-01-04 DIAGNOSIS — D689 Coagulation defect, unspecified: Secondary | ICD-10-CM

## 2014-01-04 DIAGNOSIS — D509 Iron deficiency anemia, unspecified: Secondary | ICD-10-CM | POA: Diagnosis not present

## 2014-01-04 DIAGNOSIS — D500 Iron deficiency anemia secondary to blood loss (chronic): Secondary | ICD-10-CM

## 2014-01-04 DIAGNOSIS — Z954 Presence of other heart-valve replacement: Secondary | ICD-10-CM

## 2014-01-04 DIAGNOSIS — I359 Nonrheumatic aortic valve disorder, unspecified: Secondary | ICD-10-CM

## 2014-01-04 DIAGNOSIS — I4891 Unspecified atrial fibrillation: Secondary | ICD-10-CM

## 2014-01-04 LAB — CBC WITH DIFFERENTIAL/PLATELET
BASO%: 0.2 % (ref 0.0–2.0)
BASOS ABS: 0 10*3/uL (ref 0.0–0.1)
EOS ABS: 0.1 10*3/uL (ref 0.0–0.5)
EOS%: 1.3 % (ref 0.0–7.0)
HEMATOCRIT: 30.5 % — AB (ref 34.8–46.6)
HEMOGLOBIN: 9.6 g/dL — AB (ref 11.6–15.9)
LYMPH%: 15.7 % (ref 14.0–49.7)
MCH: 33.7 pg (ref 25.1–34.0)
MCHC: 31.5 g/dL (ref 31.5–36.0)
MCV: 107 fL — ABNORMAL HIGH (ref 79.5–101.0)
MONO#: 0.4 10*3/uL (ref 0.1–0.9)
MONO%: 7.1 % (ref 0.0–14.0)
NEUT%: 75.7 % (ref 38.4–76.8)
NEUTROS ABS: 4 10*3/uL (ref 1.5–6.5)
PLATELETS: 174 10*3/uL (ref 145–400)
RBC: 2.85 10*6/uL — ABNORMAL LOW (ref 3.70–5.45)
RDW: 16.4 % — ABNORMAL HIGH (ref 11.2–14.5)
WBC: 5.2 10*3/uL (ref 3.9–10.3)
lymph#: 0.8 10*3/uL — ABNORMAL LOW (ref 0.9–3.3)
nRBC: 0 % (ref 0–0)

## 2014-01-04 LAB — PROTIME-INR
INR: 2.6 (ref 2.00–3.50)
Protime: 31.2 Seconds — ABNORMAL HIGH (ref 10.6–13.4)

## 2014-01-04 LAB — PREPARE RBC (CROSSMATCH)

## 2014-01-04 LAB — POCT INR: INR: 2.6

## 2014-01-04 MED ORDER — HEPARIN SOD (PORK) LOCK FLUSH 100 UNIT/ML IV SOLN: 500.0000 [IU] | Freq: Every day | INTRAVENOUS | Status: DC | PRN

## 2014-01-04 MED ORDER — DIPHENHYDRAMINE HCL 25 MG PO CAPS
25.0000 mg | ORAL_CAPSULE | Freq: Once | ORAL | Status: AC
  Administered 2014-01-04: 25 mg via ORAL
  Filled 2014-01-04: qty 1

## 2014-01-04 MED ORDER — SODIUM CHLORIDE 0.9 % IV SOLN
250.0000 mL | Freq: Once | INTRAVENOUS | Status: DC
  Filled 2014-01-04: qty 250

## 2014-01-04 MED ORDER — SODIUM CHLORIDE 0.9 % IV SOLN
250.0000 mL | Freq: Once | INTRAVENOUS | Status: AC
  Administered 2014-01-04: 250 mL via INTRAVENOUS

## 2014-01-04 MED ORDER — SODIUM CHLORIDE 0.9 % IJ SOLN: 10.0000 mL | INTRAMUSCULAR | Status: DC | PRN

## 2014-01-04 NOTE — Progress Notes (Signed)
INR = 2.6    Goal 2.5-3 H/H = 9.6/30.6 INR within 2.Marland Kitchen5-3 goal range. H/H has been falling despite recent iron infusions. Patient states her stools have been tarry and she has bled a little daily. She is feeling tired today. I called and spoke with Dr. Jana Hakim. He would like her to receive 2 units PRBCs today or tomorrow. Mrs. Bratcher will return next Monday 01/10/14 for lab at 10:30 and Coumadin clinic at 10:45. She will continue Coumadin 5mg  daily except 2.5mg  on Fridays.  She will going out of town on 10/14 to attend her grandson's wedding at [place]. Patient was returned to lab for blood typing and cross matching, Mateo Flow is setting up her transfusion appt.  Theone Murdoch, PharmD

## 2014-01-04 NOTE — Procedures (Signed)
[facility]  Procedure Note  BRANDELYN HENNE YQM:[id number] DOB: 07-26-1926 DOA: 01/04/2014   PCP: Darlin Coco, MD   Associated Diagnosis: Other iron deficiency anemia  Procedure Note: Transfusion of 2 units PRBCs   Condition During Procedure:  Pt tolerated well   Condition at Discharge: Alert, ambulatory, family accompanying pt.; no complications noted   Nigel Sloop, [facility]

## 2014-01-05 LAB — TYPE AND SCREEN
ABO/RH(D): O POS
Antibody Screen: NEGATIVE
UNIT DIVISION: 0
Unit division: 0

## 2014-01-06 NOTE — H&P (Signed)
No H&P needed-- this is not an admission

## 2014-01-10 ENCOUNTER — Ambulatory Visit (HOSPITAL_BASED_OUTPATIENT_CLINIC_OR_DEPARTMENT_OTHER): Payer: Medicare Other | Admitting: Pharmacist

## 2014-01-10 ENCOUNTER — Telehealth: Payer: Self-pay | Admitting: Oncology

## 2014-01-10 ENCOUNTER — Other Ambulatory Visit (HOSPITAL_BASED_OUTPATIENT_CLINIC_OR_DEPARTMENT_OTHER): Payer: Medicare Other

## 2014-01-10 DIAGNOSIS — Z954 Presence of other heart-valve replacement: Secondary | ICD-10-CM

## 2014-01-10 DIAGNOSIS — D689 Coagulation defect, unspecified: Secondary | ICD-10-CM

## 2014-01-10 DIAGNOSIS — Z7901 Long term (current) use of anticoagulants: Secondary | ICD-10-CM

## 2014-01-10 DIAGNOSIS — I4891 Unspecified atrial fibrillation: Secondary | ICD-10-CM

## 2014-01-10 DIAGNOSIS — I359 Nonrheumatic aortic valve disorder, unspecified: Secondary | ICD-10-CM

## 2014-01-10 LAB — CBC WITH DIFFERENTIAL/PLATELET
BASO%: 0.6 % (ref 0.0–2.0)
BASOS ABS: 0 10*3/uL (ref 0.0–0.1)
EOS ABS: 0.1 10*3/uL (ref 0.0–0.5)
EOS%: 1.4 % (ref 0.0–7.0)
HEMATOCRIT: 34.5 % — AB (ref 34.8–46.6)
HEMOGLOBIN: 11.1 g/dL — AB (ref 11.6–15.9)
LYMPH%: 16.8 % (ref 14.0–49.7)
MCH: 32.2 pg (ref 25.1–34.0)
MCHC: 32.2 g/dL (ref 31.5–36.0)
MCV: 100 fL (ref 79.5–101.0)
MONO#: 0.3 10*3/uL (ref 0.1–0.9)
MONO%: 8.9 % (ref 0.0–14.0)
NEUT%: 72.3 % (ref 38.4–76.8)
NEUTROS ABS: 2.6 10*3/uL (ref 1.5–6.5)
NRBC: 0 % (ref 0–0)
PLATELETS: 166 10*3/uL (ref 145–400)
RBC: 3.45 10*6/uL — AB (ref 3.70–5.45)
RDW: 17 % — ABNORMAL HIGH (ref 11.2–14.5)
WBC: 3.6 10*3/uL — AB (ref 3.9–10.3)
lymph#: 0.6 10*3/uL — ABNORMAL LOW (ref 0.9–3.3)

## 2014-01-10 LAB — PROTIME-INR
INR: 2.7 (ref 2.00–3.50)
Protime: 32.4 s — ABNORMAL HIGH (ref 10.6–13.4)

## 2014-01-10 LAB — POCT INR: INR: 2.7

## 2014-01-10 NOTE — Telephone Encounter (Signed)
per Kolleen to sch CC-pt aware °

## 2014-01-10 NOTE — Progress Notes (Signed)
Pt seen in clinic today INR=2.7 No new meds or diet changes Patient still having her chronic black stools (accorging to MD note this is a chronic issue) Her Hemaglobin was 11.1 today No changes to her coumadin dose Continue 5mg  daily with 2.5mg  on Friday RTC in 2 weeks 01/25/14 Lab: 10:30 and CC: 10:45

## 2014-01-10 NOTE — Patient Instructions (Signed)
Continue Coumadin 5mg  daily except 2.5mg  on Fridays.  Recheck INR on 01/25/14:  lab at 10:30am & Coumadin clinic at 10:45am.

## 2014-01-11 ENCOUNTER — Other Ambulatory Visit: Payer: Medicare Other

## 2014-01-25 ENCOUNTER — Ambulatory Visit (HOSPITAL_BASED_OUTPATIENT_CLINIC_OR_DEPARTMENT_OTHER): Payer: Medicare Other | Admitting: Pharmacist

## 2014-01-25 ENCOUNTER — Other Ambulatory Visit (HOSPITAL_BASED_OUTPATIENT_CLINIC_OR_DEPARTMENT_OTHER): Payer: Medicare Other

## 2014-01-25 DIAGNOSIS — D689 Coagulation defect, unspecified: Secondary | ICD-10-CM

## 2014-01-25 DIAGNOSIS — I4891 Unspecified atrial fibrillation: Secondary | ICD-10-CM

## 2014-01-25 DIAGNOSIS — Z7901 Long term (current) use of anticoagulants: Secondary | ICD-10-CM

## 2014-01-25 DIAGNOSIS — Z954 Presence of other heart-valve replacement: Secondary | ICD-10-CM

## 2014-01-25 DIAGNOSIS — I359 Nonrheumatic aortic valve disorder, unspecified: Secondary | ICD-10-CM

## 2014-01-25 LAB — CBC WITH DIFFERENTIAL/PLATELET
BASO%: 0.7 % (ref 0.0–2.0)
Basophils Absolute: 0 10*3/uL (ref 0.0–0.1)
EOS%: 1.2 % (ref 0.0–7.0)
Eosinophils Absolute: 0 10*3/uL (ref 0.0–0.5)
HCT: 39 % (ref 34.8–46.6)
HEMOGLOBIN: 12.3 g/dL (ref 11.6–15.9)
LYMPH%: 12.8 % — ABNORMAL LOW (ref 14.0–49.7)
MCH: 31.2 pg (ref 25.1–34.0)
MCHC: 31.6 g/dL (ref 31.5–36.0)
MCV: 98.8 fL (ref 79.5–101.0)
MONO#: 0.3 10*3/uL (ref 0.1–0.9)
MONO%: 6.9 % (ref 0.0–14.0)
NEUT#: 3.2 10*3/uL (ref 1.5–6.5)
NEUT%: 78.4 % — ABNORMAL HIGH (ref 38.4–76.8)
Platelets: 186 10*3/uL (ref 145–400)
RBC: 3.95 10*6/uL (ref 3.70–5.45)
RDW: 16.5 % — AB (ref 11.2–14.5)
WBC: 4 10*3/uL (ref 3.9–10.3)
lymph#: 0.5 10*3/uL — ABNORMAL LOW (ref 0.9–3.3)

## 2014-01-25 LAB — PROTIME-INR
INR: 3.5 (ref 2.00–3.50)
Protime: 42 Seconds — ABNORMAL HIGH (ref 10.6–13.4)

## 2014-01-25 LAB — POCT INR: INR: 3.5

## 2014-01-25 NOTE — Progress Notes (Signed)
INR above goal today of 2.5-3.  H/H=12.3/39 Plt=186000.  Ms Kearl states that she has had no bleeding until yesterday.  She had several bouts of very dark colored diarrhea.  She took imodium and has not had a BM since yesterday.  Will hold coumadin dose today, then resume 5mg  daily and 2.5mg  on Friday.  Will check PT/INR in 2 weeks with next scheduled lab.

## 2014-02-07 ENCOUNTER — Other Ambulatory Visit: Payer: Self-pay | Admitting: Cardiology

## 2014-02-07 DIAGNOSIS — G4700 Insomnia, unspecified: Secondary | ICD-10-CM

## 2014-02-08 ENCOUNTER — Ambulatory Visit (HOSPITAL_BASED_OUTPATIENT_CLINIC_OR_DEPARTMENT_OTHER): Payer: Medicare Other | Admitting: Pharmacist

## 2014-02-08 ENCOUNTER — Other Ambulatory Visit: Payer: Self-pay | Admitting: *Deleted

## 2014-02-08 ENCOUNTER — Other Ambulatory Visit (HOSPITAL_BASED_OUTPATIENT_CLINIC_OR_DEPARTMENT_OTHER): Payer: Medicare Other

## 2014-02-08 DIAGNOSIS — R5383 Other fatigue: Secondary | ICD-10-CM

## 2014-02-08 DIAGNOSIS — R309 Painful micturition, unspecified: Secondary | ICD-10-CM

## 2014-02-08 DIAGNOSIS — D689 Coagulation defect, unspecified: Secondary | ICD-10-CM

## 2014-02-08 DIAGNOSIS — R5381 Other malaise: Secondary | ICD-10-CM

## 2014-02-08 DIAGNOSIS — Z954 Presence of other heart-valve replacement: Secondary | ICD-10-CM

## 2014-02-08 DIAGNOSIS — D500 Iron deficiency anemia secondary to blood loss (chronic): Secondary | ICD-10-CM

## 2014-02-08 DIAGNOSIS — Z7901 Long term (current) use of anticoagulants: Secondary | ICD-10-CM

## 2014-02-08 LAB — URINALYSIS, MICROSCOPIC - CHCC
Bilirubin (Urine): NEGATIVE
Glucose: NEGATIVE mg/dL
Ketones: NEGATIVE mg/dL
Leukocyte Esterase: NEGATIVE
Nitrite: NEGATIVE
PH: 7.5 (ref 4.6–8.0)
PROTEIN: NEGATIVE mg/dL
SPECIFIC GRAVITY, URINE: 1.01 (ref 1.003–1.035)
UROBILINOGEN UR: 0.2 mg/dL (ref 0.2–1)

## 2014-02-08 LAB — CBC WITH DIFFERENTIAL/PLATELET
BASO%: 0.2 % (ref 0.0–2.0)
BASOS ABS: 0 10*3/uL (ref 0.0–0.1)
EOS%: 1.3 % (ref 0.0–7.0)
Eosinophils Absolute: 0.1 10*3/uL (ref 0.0–0.5)
HEMATOCRIT: 37.7 % (ref 34.8–46.6)
HEMOGLOBIN: 12.3 g/dL (ref 11.6–15.9)
LYMPH%: 16.9 % (ref 14.0–49.7)
MCH: 31.5 pg (ref 25.1–34.0)
MCHC: 32.6 g/dL (ref 31.5–36.0)
MCV: 96.7 fL (ref 79.5–101.0)
MONO#: 0.3 10*3/uL (ref 0.1–0.9)
MONO%: 7.4 % (ref 0.0–14.0)
NEUT#: 3.4 10*3/uL (ref 1.5–6.5)
NEUT%: 74.2 % (ref 38.4–76.8)
Platelets: 160 10*3/uL (ref 145–400)
RBC: 3.9 10*6/uL (ref 3.70–5.45)
RDW: 15.2 % — ABNORMAL HIGH (ref 11.2–14.5)
WBC: 4.6 10*3/uL (ref 3.9–10.3)
lymph#: 0.8 10*3/uL — ABNORMAL LOW (ref 0.9–3.3)
nRBC: 0 % (ref 0–0)

## 2014-02-08 LAB — PROTIME-INR
INR: 3.9 — ABNORMAL HIGH (ref 2.00–3.50)
Protime: 46.8 Seconds — ABNORMAL HIGH (ref 10.6–13.4)

## 2014-02-08 LAB — POCT INR: INR: 3.9

## 2014-02-08 NOTE — Patient Instructions (Signed)
Take 2.5 mg Coumadin today then continue Coumadin 5mg  daily except 2.5mg  on Fridays.  Recheck INR on 02/21/14:  lab at 10:30am & Coumadin clinic at 10:45am.

## 2014-02-08 NOTE — Progress Notes (Signed)
Pt seen in clinic today INR=3.9 Pt states she also had a U/A done as she is having UTI symptoms Awaiting those results and a call from [facility] May see pt back sooner than 11/23 if she is placed on abx Still complains of dark black stools.  Hgb 12.4 Will take half a dose today (2.5mg ) then resume her dose of 5mg  daily with 2.5 mg on Friday RTC 02/21/14 at 10:30 for lab and 10:45 for CC

## 2014-02-09 ENCOUNTER — Telehealth: Payer: Self-pay | Admitting: *Deleted

## 2014-02-09 LAB — URINE CULTURE

## 2014-02-09 NOTE — Telephone Encounter (Signed)
This RN spoke with pt per U/A result with normal levels.  Discussed possible cystitis symptoms may have been related to recent long car ride and activities that did not allow her to hydrate herself well.  Urine culture has been sent for evaluation for any microscopic concerns and presently pt should hydrate herself well and monitor for worsening of symptoms.  Deborah [name] verbalized understanding.

## 2014-02-18 ENCOUNTER — Telehealth: Payer: Self-pay | Admitting: Oncology

## 2014-02-18 NOTE — Telephone Encounter (Signed)
per Brandy to sch CC-pt aware °

## 2014-02-18 NOTE — Telephone Encounter (Signed)
pt cld to chge appt to 11/23-gave pt new time & date

## 2014-02-21 ENCOUNTER — Other Ambulatory Visit: Payer: Self-pay | Admitting: *Deleted

## 2014-02-21 ENCOUNTER — Other Ambulatory Visit (HOSPITAL_BASED_OUTPATIENT_CLINIC_OR_DEPARTMENT_OTHER): Payer: Medicare Other

## 2014-02-21 ENCOUNTER — Ambulatory Visit: Payer: Medicare Other

## 2014-02-21 ENCOUNTER — Ambulatory Visit (HOSPITAL_COMMUNITY)
Admission: RE | Admit: 2014-02-21 | Discharge: 2014-02-21 | Disposition: A | Discharge status: Home / Self Care | Payer: Medicare Other | Source: Ambulatory Visit | Attending: Oncology | Admitting: Oncology

## 2014-02-21 ENCOUNTER — Other Ambulatory Visit: Payer: Self-pay | Admitting: Oncology

## 2014-02-21 ENCOUNTER — Ambulatory Visit (HOSPITAL_BASED_OUTPATIENT_CLINIC_OR_DEPARTMENT_OTHER): Payer: Medicare Other

## 2014-02-21 ENCOUNTER — Telehealth: Payer: Self-pay | Admitting: Oncology

## 2014-02-21 ENCOUNTER — Ambulatory Visit (HOSPITAL_BASED_OUTPATIENT_CLINIC_OR_DEPARTMENT_OTHER): Payer: Medicare Other | Admitting: Pharmacist

## 2014-02-21 ENCOUNTER — Other Ambulatory Visit: Payer: Self-pay | Admitting: Pharmacist

## 2014-02-21 DIAGNOSIS — D689 Coagulation defect, unspecified: Secondary | ICD-10-CM

## 2014-02-21 DIAGNOSIS — R5381 Other malaise: Secondary | ICD-10-CM

## 2014-02-21 DIAGNOSIS — D508 Other iron deficiency anemias: Secondary | ICD-10-CM | POA: Diagnosis not present

## 2014-02-21 DIAGNOSIS — R5383 Other fatigue: Principal | ICD-10-CM

## 2014-02-21 DIAGNOSIS — D500 Iron deficiency anemia secondary to blood loss (chronic): Secondary | ICD-10-CM

## 2014-02-21 DIAGNOSIS — Z7901 Long term (current) use of anticoagulants: Secondary | ICD-10-CM

## 2014-02-21 DIAGNOSIS — Z954 Presence of other heart-valve replacement: Secondary | ICD-10-CM

## 2014-02-21 LAB — PROTIME-INR
INR: 3.4 (ref 2.00–3.50)
Protime: 40.8 Seconds — ABNORMAL HIGH (ref 10.6–13.4)

## 2014-02-21 LAB — CBC WITH DIFFERENTIAL/PLATELET
BASO%: 0.2 % (ref 0.0–2.0)
Basophils Absolute: 0 10*3/uL (ref 0.0–0.1)
EOS ABS: 0.1 10*3/uL (ref 0.0–0.5)
EOS%: 1.9 % (ref 0.0–7.0)
HEMATOCRIT: 32 % — AB (ref 34.8–46.6)
HGB: 10.1 g/dL — ABNORMAL LOW (ref 11.6–15.9)
LYMPH%: 17.9 % (ref 14.0–49.7)
MCH: 30.8 pg (ref 25.1–34.0)
MCHC: 31.6 g/dL (ref 31.5–36.0)
MCV: 97.6 fL (ref 79.5–101.0)
MONO#: 0.3 10*3/uL (ref 0.1–0.9)
MONO%: 8 % (ref 0.0–14.0)
NEUT%: 72 % (ref 38.4–76.8)
NEUTROS ABS: 3.1 10*3/uL (ref 1.5–6.5)
NRBC: 0 % (ref 0–0)
PLATELETS: 174 10*3/uL (ref 145–400)
RBC: 3.28 10*6/uL — ABNORMAL LOW (ref 3.70–5.45)
RDW: 15.7 % — ABNORMAL HIGH (ref 11.2–14.5)
WBC: 4.3 10*3/uL (ref 3.9–10.3)
lymph#: 0.8 10*3/uL — ABNORMAL LOW (ref 0.9–3.3)

## 2014-02-21 LAB — FERRITIN CHCC: Ferritin: 41 ng/ml (ref 9–269)

## 2014-02-21 LAB — HOLD TUBE, BLOOD BANK

## 2014-02-21 LAB — POCT INR: INR: 3.4

## 2014-02-21 NOTE — Progress Notes (Signed)
INR slightly above goal.  H/H=10.1/32.  Ms Piotrowski has had dark stools the past couple days.  No med changes.  Will take 2.5mg  today then continue coumadin 5mg  daily and 2.5mg  on Fridays.  I spoke to Dr Jana Hakim and will check a ferritin today and type and cross for possible PRBC vs iron infusion.  Will check PT/INR in 2 weeks.  Note: Ferritin should be checked monthly.

## 2014-02-21 NOTE — Telephone Encounter (Signed)
per Melissa to add CC-pt aware °

## 2014-02-22 ENCOUNTER — Other Ambulatory Visit: Payer: Medicare Other

## 2014-02-22 ENCOUNTER — Ambulatory Visit: Payer: Medicare Other | Admitting: Cardiology

## 2014-02-22 ENCOUNTER — Ambulatory Visit (HOSPITAL_BASED_OUTPATIENT_CLINIC_OR_DEPARTMENT_OTHER): Payer: Medicare Other

## 2014-02-22 VITALS — BP 152/51 | HR 54 | Temp 97.5°F | Resp 18

## 2014-02-22 DIAGNOSIS — D509 Iron deficiency anemia, unspecified: Secondary | ICD-10-CM

## 2014-02-22 DIAGNOSIS — D508 Other iron deficiency anemias: Secondary | ICD-10-CM | POA: Diagnosis not present

## 2014-02-22 DIAGNOSIS — R5383 Other fatigue: Secondary | ICD-10-CM

## 2014-02-22 DIAGNOSIS — R5381 Other malaise: Secondary | ICD-10-CM

## 2014-02-22 LAB — PREPARE RBC (CROSSMATCH)

## 2014-02-22 LAB — TYPE AND SCREEN
ABO/RH(D): O POS
Antibody Screen: NEGATIVE
UNIT DIVISION: 0

## 2014-02-22 MED ORDER — DIPHENHYDRAMINE HCL 25 MG PO CAPS
25.0000 mg | ORAL_CAPSULE | Freq: Once | ORAL | Status: AC
  Administered 2014-02-22: 25 mg via ORAL

## 2014-02-22 MED ORDER — SODIUM CHLORIDE 0.9 % IV SOLN
250.0000 mL | Freq: Once | INTRAVENOUS | Status: AC
  Administered 2014-02-22: 250 mL via INTRAVENOUS

## 2014-02-22 MED ORDER — ACETAMINOPHEN 325 MG PO TABS
650.0000 mg | ORAL_TABLET | Freq: Once | ORAL | Status: AC
  Administered 2014-02-22: 650 mg via ORAL

## 2014-02-22 MED ORDER — SODIUM CHLORIDE 0.9 % IV SOLN
1020.0000 mg | Freq: Once | INTRAVENOUS | Status: AC
  Administered 2014-02-22: 1020 mg via INTRAVENOUS
  Filled 2014-02-22: qty 34

## 2014-02-22 MED ORDER — DIPHENHYDRAMINE HCL 25 MG PO CAPS
ORAL_CAPSULE | ORAL | Status: AC
  Filled 2014-02-22: qty 1

## 2014-02-22 MED ORDER — ACETAMINOPHEN 325 MG PO TABS
ORAL_TABLET | ORAL | Status: AC
  Filled 2014-02-22: qty 2

## 2014-02-22 NOTE — Patient Instructions (Signed)

## 2014-02-23 ENCOUNTER — Ambulatory Visit: Payer: Medicare Other | Admitting: Cardiology

## 2014-02-23 ENCOUNTER — Other Ambulatory Visit: Payer: Self-pay | Admitting: *Deleted

## 2014-02-23 ENCOUNTER — Ambulatory Visit (HOSPITAL_BASED_OUTPATIENT_CLINIC_OR_DEPARTMENT_OTHER): Payer: Medicare Other

## 2014-02-23 VITALS — BP 152/72 | HR 52 | Temp 98.1°F | Resp 18

## 2014-02-23 DIAGNOSIS — D500 Iron deficiency anemia secondary to blood loss (chronic): Secondary | ICD-10-CM

## 2014-02-23 DIAGNOSIS — D508 Other iron deficiency anemias: Secondary | ICD-10-CM | POA: Diagnosis not present

## 2014-02-23 LAB — PREPARE RBC (CROSSMATCH)

## 2014-02-23 MED ORDER — ACETAMINOPHEN 325 MG PO TABS
ORAL_TABLET | ORAL | Status: AC
  Filled 2014-02-23: qty 2

## 2014-02-23 MED ORDER — SODIUM CHLORIDE 0.9 % IV SOLN
250.0000 mL | Freq: Once | INTRAVENOUS | Status: AC
  Administered 2014-02-23: 250 mL via INTRAVENOUS

## 2014-02-23 MED ORDER — DIPHENHYDRAMINE HCL 25 MG PO CAPS
ORAL_CAPSULE | ORAL | Status: AC
  Filled 2014-02-23: qty 1

## 2014-02-23 MED ORDER — DIPHENHYDRAMINE HCL 25 MG PO CAPS
25.0000 mg | ORAL_CAPSULE | Freq: Once | ORAL | Status: AC
  Administered 2014-02-23: 25 mg via ORAL

## 2014-02-23 MED ORDER — ACETAMINOPHEN 325 MG PO TABS
ORAL_TABLET | ORAL | Status: AC
  Filled 2014-02-23: qty 1

## 2014-02-23 MED ORDER — ACETAMINOPHEN 325 MG PO TABS
650.0000 mg | ORAL_TABLET | Freq: Once | ORAL | Status: AC
  Administered 2014-02-23: 650 mg via ORAL

## 2014-02-23 MED ORDER — SODIUM CHLORIDE 0.9 % IV SOLN: 250.0000 mL | Freq: Once | INTRAVENOUS | Status: AC

## 2014-02-23 NOTE — Patient Instructions (Signed)

## 2014-02-26 LAB — TYPE AND SCREEN
ABO/RH(D): O POS
ANTIBODY SCREEN: NEGATIVE
UNIT DIVISION: 0
Unit division: 0

## 2014-03-02 ENCOUNTER — Telehealth: Payer: Self-pay | Admitting: Cardiology

## 2014-03-02 NOTE — Telephone Encounter (Signed)
New Msg   Spoke with pt, she received flu shot at [facility] in October.

## 2014-03-03 ENCOUNTER — Other Ambulatory Visit: Payer: Self-pay | Admitting: Cardiology

## 2014-03-08 ENCOUNTER — Ambulatory Visit (HOSPITAL_BASED_OUTPATIENT_CLINIC_OR_DEPARTMENT_OTHER): Payer: Medicare Other | Admitting: Pharmacist

## 2014-03-08 ENCOUNTER — Telehealth: Payer: Self-pay | Admitting: Oncology

## 2014-03-08 ENCOUNTER — Other Ambulatory Visit: Payer: Self-pay | Admitting: Emergency Medicine

## 2014-03-08 ENCOUNTER — Ambulatory Visit (HOSPITAL_BASED_OUTPATIENT_CLINIC_OR_DEPARTMENT_OTHER): Payer: Medicare Other | Admitting: Lab

## 2014-03-08 DIAGNOSIS — R5383 Other fatigue: Secondary | ICD-10-CM

## 2014-03-08 DIAGNOSIS — D689 Coagulation defect, unspecified: Secondary | ICD-10-CM

## 2014-03-08 DIAGNOSIS — Z7901 Long term (current) use of anticoagulants: Secondary | ICD-10-CM

## 2014-03-08 DIAGNOSIS — R5381 Other malaise: Secondary | ICD-10-CM

## 2014-03-08 DIAGNOSIS — D500 Iron deficiency anemia secondary to blood loss (chronic): Secondary | ICD-10-CM

## 2014-03-08 DIAGNOSIS — Z954 Presence of other heart-valve replacement: Secondary | ICD-10-CM

## 2014-03-08 LAB — CBC WITH DIFFERENTIAL/PLATELET
BASO%: 0.3 % (ref 0.0–2.0)
Basophils Absolute: 0 10*3/uL (ref 0.0–0.1)
EOS ABS: 0.1 10*3/uL (ref 0.0–0.5)
EOS%: 0.9 % (ref 0.0–7.0)
HEMATOCRIT: 33.2 % — AB (ref 34.8–46.6)
HGB: 10.8 g/dL — ABNORMAL LOW (ref 11.6–15.9)
LYMPH#: 0.7 10*3/uL — AB (ref 0.9–3.3)
LYMPH%: 11.3 % — ABNORMAL LOW (ref 14.0–49.7)
MCH: 31.9 pg (ref 25.1–34.0)
MCHC: 32.5 g/dL (ref 31.5–36.0)
MCV: 97.9 fL (ref 79.5–101.0)
MONO#: 0.3 10*3/uL (ref 0.1–0.9)
MONO%: 5.4 % (ref 0.0–14.0)
NEUT#: 4.7 10*3/uL (ref 1.5–6.5)
NEUT%: 82.1 % — AB (ref 38.4–76.8)
Platelets: ADEQUATE 10*3/uL (ref 145–400)
RBC: 3.39 10*6/uL — ABNORMAL LOW (ref 3.70–5.45)
RDW: 17.4 % — AB (ref 11.2–14.5)
WBC: 5.8 10*3/uL (ref 3.9–10.3)
nRBC: 0 % (ref 0–0)

## 2014-03-08 LAB — POCT INR: INR: 2.2

## 2014-03-08 LAB — PROTIME-INR
INR: 2.2 (ref 2.00–3.50)
Protime: 26.4 Seconds — ABNORMAL HIGH (ref 10.6–13.4)

## 2014-03-08 NOTE — Telephone Encounter (Signed)
per Theadora Rama to sch pt CC-pt aware

## 2014-03-08 NOTE — Progress Notes (Signed)
INR is slightly below goal today. INR goal = 2.5-3. Hg/Hct = 10.8/33.2, Pltc = adequate. Feraheme received on 02/22/14 and transfused on 02/23/14. Pt has been taking 5mg  daily except 2.5mg  on M&F for the last weeks. No missed coumadin doses. No changes in diet or medications. No s/s of clotting noted. No unusual bruising. No bleeding noted. Pt has continued black stools "very black at times" and she has been more "crampy". Her stools were less black this morning. Take Coumadin 7.5mg  today.   On 03/09/14, resume Coumadin 5mg  daily except 2.5mg  on Fridays.   Recheck INR on 03/22/14:  lab at 10:30am & Coumadin clinic at 10:45am.

## 2014-03-08 NOTE — Patient Instructions (Signed)
Take Coumadin 7.5mg  today.   On 03/09/14, resume Coumadin 5mg  daily except 2.5mg  on Fridays.   Recheck INR on 03/22/14:  lab at 10:30am & Coumadin clinic at 10:45am.

## 2014-03-14 ENCOUNTER — Other Ambulatory Visit: Payer: Self-pay | Admitting: *Deleted

## 2014-03-14 DIAGNOSIS — D500 Iron deficiency anemia secondary to blood loss (chronic): Secondary | ICD-10-CM

## 2014-03-14 DIAGNOSIS — D689 Coagulation defect, unspecified: Secondary | ICD-10-CM

## 2014-03-14 NOTE — Progress Notes (Signed)
Pt called to this RN to state concern due to ongoing dark stools- " even though I had the iron infusion and blood previously " .  Deborah [name] is scheduled for labs next week but is concerned labs may be low " and then Christmas is coming.

## 2014-03-16 ENCOUNTER — Other Ambulatory Visit (HOSPITAL_BASED_OUTPATIENT_CLINIC_OR_DEPARTMENT_OTHER): Payer: Medicare Other

## 2014-03-16 DIAGNOSIS — D500 Iron deficiency anemia secondary to blood loss (chronic): Secondary | ICD-10-CM

## 2014-03-16 DIAGNOSIS — D689 Coagulation defect, unspecified: Secondary | ICD-10-CM

## 2014-03-16 LAB — FERRITIN CHCC: FERRITIN: 300 ng/mL — AB (ref 9–269)

## 2014-03-16 LAB — PROTIME-INR
INR: 2.7 (ref 2.00–3.50)
Protime: 32.4 Seconds — ABNORMAL HIGH (ref 10.6–13.4)

## 2014-03-16 LAB — CBC WITH DIFFERENTIAL/PLATELET
BASO%: 0.3 % (ref 0.0–2.0)
BASOS ABS: 0 10*3/uL (ref 0.0–0.1)
EOS ABS: 0 10*3/uL (ref 0.0–0.5)
EOS%: 0.9 % (ref 0.0–7.0)
HCT: 33.1 % — ABNORMAL LOW (ref 34.8–46.6)
HEMOGLOBIN: 10.5 g/dL — AB (ref 11.6–15.9)
LYMPH%: 13.1 % — ABNORMAL LOW (ref 14.0–49.7)
MCH: 32.4 pg (ref 25.1–34.0)
MCHC: 31.7 g/dL (ref 31.5–36.0)
MCV: 102.2 fL — AB (ref 79.5–101.0)
MONO#: 0.3 10*3/uL (ref 0.1–0.9)
MONO%: 7.4 % (ref 0.0–14.0)
NEUT%: 78.3 % — ABNORMAL HIGH (ref 38.4–76.8)
NEUTROS ABS: 2.6 10*3/uL (ref 1.5–6.5)
Platelets: 163 10*3/uL (ref 145–400)
RBC: 3.24 10*6/uL — ABNORMAL LOW (ref 3.70–5.45)
RDW: 18 % — AB (ref 11.2–14.5)
WBC: 3.4 10*3/uL — AB (ref 3.9–10.3)
lymph#: 0.4 10*3/uL — ABNORMAL LOW (ref 0.9–3.3)

## 2014-03-16 LAB — HOLD TUBE, BLOOD BANK

## 2014-03-22 ENCOUNTER — Ambulatory Visit (HOSPITAL_BASED_OUTPATIENT_CLINIC_OR_DEPARTMENT_OTHER): Payer: Medicare Other | Admitting: Lab

## 2014-03-22 ENCOUNTER — Ambulatory Visit (HOSPITAL_BASED_OUTPATIENT_CLINIC_OR_DEPARTMENT_OTHER): Payer: Medicare Other | Admitting: Pharmacist

## 2014-03-22 ENCOUNTER — Ambulatory Visit: Payer: Medicare Other

## 2014-03-22 ENCOUNTER — Ambulatory Visit (HOSPITAL_COMMUNITY)
Admission: RE | Admit: 2014-03-22 | Discharge: 2014-03-22 | Disposition: A | Discharge status: Home / Self Care | Payer: Medicare Other | Source: Ambulatory Visit | Attending: Oncology | Admitting: Oncology

## 2014-03-22 ENCOUNTER — Telehealth: Payer: Self-pay | Admitting: Oncology

## 2014-03-22 ENCOUNTER — Other Ambulatory Visit: Payer: Self-pay | Admitting: Emergency Medicine

## 2014-03-22 DIAGNOSIS — D509 Iron deficiency anemia, unspecified: Secondary | ICD-10-CM | POA: Insufficient documentation

## 2014-03-22 DIAGNOSIS — Z7901 Long term (current) use of anticoagulants: Secondary | ICD-10-CM

## 2014-03-22 DIAGNOSIS — D689 Coagulation defect, unspecified: Secondary | ICD-10-CM

## 2014-03-22 DIAGNOSIS — Z954 Presence of other heart-valve replacement: Secondary | ICD-10-CM

## 2014-03-22 LAB — PROTIME-INR
INR: 3.3 (ref 2.00–3.50)
PROTIME: 39.6 s — AB (ref 10.6–13.4)

## 2014-03-22 LAB — CBC WITH DIFFERENTIAL/PLATELET
BASO%: 0.4 % (ref 0.0–2.0)
Basophils Absolute: 0 10*3/uL (ref 0.0–0.1)
EOS ABS: 0 10*3/uL (ref 0.0–0.5)
EOS%: 0.9 % (ref 0.0–7.0)
HCT: 29.6 % — ABNORMAL LOW (ref 34.8–46.6)
HEMOGLOBIN: 9.4 g/dL — AB (ref 11.6–15.9)
LYMPH%: 11.7 % — AB (ref 14.0–49.7)
MCH: 32.5 pg (ref 25.1–34.0)
MCHC: 31.8 g/dL (ref 31.5–36.0)
MCV: 102.4 fL — ABNORMAL HIGH (ref 79.5–101.0)
MONO#: 0.4 10*3/uL (ref 0.1–0.9)
MONO%: 8.6 % (ref 0.0–14.0)
NEUT%: 78.4 % — ABNORMAL HIGH (ref 38.4–76.8)
NEUTROS ABS: 3.6 10*3/uL (ref 1.5–6.5)
NRBC: 0 % (ref 0–0)
PLATELETS: 175 10*3/uL (ref 145–400)
RBC: 2.89 10*6/uL — AB (ref 3.70–5.45)
RDW: 17.6 % — ABNORMAL HIGH (ref 11.2–14.5)
WBC: 4.5 10*3/uL (ref 3.9–10.3)
lymph#: 0.5 10*3/uL — ABNORMAL LOW (ref 0.9–3.3)

## 2014-03-22 LAB — PREPARE RBC (CROSSMATCH)

## 2014-03-22 LAB — POCT INR: INR: 3.3

## 2014-03-22 NOTE — Telephone Encounter (Signed)
per Gerald Stabs in [place] to sch pt appt-pt aware

## 2014-03-22 NOTE — Patient Instructions (Addendum)
INR slightly above goal Tonight Take Coumadin 2.5 mg (half tablet).   Then resume Coumadin 5mg  daily except 2.5mg  on Fridays.   Recheck INR on 04/05/14:  lab at 10:30am & Coumadin clinic at 10:45am.

## 2014-03-22 NOTE — Progress Notes (Signed)
INR slightly above goal Pt is doing well but has been feeling fatigued Hgb lower today at 9.4. Due to symptoms. Ms. Olenick will be transfused on 12/23 Continued chronic bleeding in stools. This has not changed No other unusual bleeding or bruising No diet or medication changes Plan: Tonight Take Coumadin 2.5 mg (half tablet).   Then resume Coumadin 5mg  daily except 2.5mg  on Fridays.   Recheck INR on 04/05/14:  lab at 10:30am & Coumadin clinic at 10:45am.

## 2014-03-23 ENCOUNTER — Ambulatory Visit (HOSPITAL_BASED_OUTPATIENT_CLINIC_OR_DEPARTMENT_OTHER): Payer: Medicare Other

## 2014-03-23 VITALS — BP 177/92 | HR 67 | Temp 98.2°F | Resp 18

## 2014-03-23 DIAGNOSIS — D509 Iron deficiency anemia, unspecified: Secondary | ICD-10-CM

## 2014-03-23 MED ORDER — ACETAMINOPHEN 325 MG PO TABS
650.0000 mg | ORAL_TABLET | Freq: Once | ORAL | Status: AC
  Administered 2014-03-23: 650 mg via ORAL

## 2014-03-23 MED ORDER — SODIUM CHLORIDE 0.9 % IV SOLN
250.0000 mL | Freq: Once | INTRAVENOUS | Status: AC
  Administered 2014-03-23: 250 mL via INTRAVENOUS

## 2014-03-23 MED ORDER — ACETAMINOPHEN 325 MG PO TABS
ORAL_TABLET | ORAL | Status: AC
  Filled 2014-03-23: qty 2

## 2014-03-23 MED ORDER — DIPHENHYDRAMINE HCL 25 MG PO CAPS
ORAL_CAPSULE | ORAL | Status: AC
Start: 2014-03-23 — End: 2014-03-23
  Filled 2014-03-23: qty 1

## 2014-03-23 MED ORDER — DIPHENHYDRAMINE HCL 25 MG PO CAPS
25.0000 mg | ORAL_CAPSULE | Freq: Once | ORAL | Status: AC
  Administered 2014-03-23: 25 mg via ORAL

## 2014-03-23 NOTE — Patient Instructions (Signed)

## 2014-03-24 LAB — TYPE AND SCREEN
ABO/RH(D): O POS
ANTIBODY SCREEN: NEGATIVE
UNIT DIVISION: 0
Unit division: 0

## 2014-04-05 ENCOUNTER — Other Ambulatory Visit: Payer: Medicare Other

## 2014-04-05 ENCOUNTER — Other Ambulatory Visit: Payer: Self-pay | Admitting: Cardiology

## 2014-04-05 ENCOUNTER — Ambulatory Visit (HOSPITAL_BASED_OUTPATIENT_CLINIC_OR_DEPARTMENT_OTHER): Payer: Medicare Other | Admitting: Pharmacist

## 2014-04-05 ENCOUNTER — Other Ambulatory Visit (HOSPITAL_BASED_OUTPATIENT_CLINIC_OR_DEPARTMENT_OTHER): Payer: Medicare Other

## 2014-04-05 DIAGNOSIS — R5383 Other fatigue: Secondary | ICD-10-CM

## 2014-04-05 DIAGNOSIS — D500 Iron deficiency anemia secondary to blood loss (chronic): Secondary | ICD-10-CM

## 2014-04-05 DIAGNOSIS — D689 Coagulation defect, unspecified: Secondary | ICD-10-CM

## 2014-04-05 DIAGNOSIS — Z7901 Long term (current) use of anticoagulants: Secondary | ICD-10-CM

## 2014-04-05 DIAGNOSIS — R5381 Other malaise: Secondary | ICD-10-CM

## 2014-04-05 DIAGNOSIS — Z954 Presence of other heart-valve replacement: Secondary | ICD-10-CM

## 2014-04-05 LAB — CBC WITH DIFFERENTIAL/PLATELET
BASO%: 0.2 % (ref 0.0–2.0)
Basophils Absolute: 0 10*3/uL (ref 0.0–0.1)
EOS ABS: 0.1 10*3/uL (ref 0.0–0.5)
EOS%: 0.9 % (ref 0.0–7.0)
HCT: 37.5 % (ref 34.8–46.6)
HGB: 12.2 g/dL (ref 11.6–15.9)
LYMPH#: 0.8 10*3/uL — AB (ref 0.9–3.3)
LYMPH%: 14.4 % (ref 14.0–49.7)
MCH: 31.9 pg (ref 25.1–34.0)
MCHC: 32.5 g/dL (ref 31.5–36.0)
MCV: 98.2 fL (ref 79.5–101.0)
MONO#: 0.4 10*3/uL (ref 0.1–0.9)
MONO%: 6.4 % (ref 0.0–14.0)
NEUT%: 78.1 % — ABNORMAL HIGH (ref 38.4–76.8)
NEUTROS ABS: 4.5 10*3/uL (ref 1.5–6.5)
NRBC: 0 % (ref 0–0)
PLATELETS: 190 10*3/uL (ref 145–400)
RBC: 3.82 10*6/uL (ref 3.70–5.45)
RDW: 15.9 % — ABNORMAL HIGH (ref 11.2–14.5)
WBC: 5.8 10*3/uL (ref 3.9–10.3)

## 2014-04-05 LAB — POCT INR: INR: 3.5

## 2014-04-05 LAB — PROTIME-INR
INR: 3.5 (ref 2.00–3.50)
PROTIME: 42 s — AB (ref 10.6–13.4)

## 2014-04-05 NOTE — Progress Notes (Signed)
Quick Note:  Please make copy of labs for patient visit. ______ 

## 2014-04-05 NOTE — Progress Notes (Signed)
INR just above goal today Deborah [name] is doing well today with no complaints She is feeling better after transfusion prior to Christmas. Hemoglobin is 12.2 today Pt continues to have chronic dark stools but Deborah [name] states this improved after her transfusion but has since returned to baseline.  No missed or extra doses No other medication or diet changes Pt states she will have a spinach sandwich today since INR is elevated We will decrease her dose slightly Plan: Decrease dose to Coumadin 5mg  daily except 2.5mg  on Tuesdays and Fridays.  Recheck INR on 04/19/14:  lab at 10:30am & Coumadin clinic at 10:45am.

## 2014-04-05 NOTE — Patient Instructions (Signed)
INR just above goal today Decrease dose to Coumadin 5mg  daily except 2.5mg  on Tuesdays and Fridays.  Recheck INR on 04/19/14:  lab at 10:30am & Coumadin clinic at 10:45am.

## 2014-04-06 ENCOUNTER — Telehealth: Payer: Self-pay | Admitting: Oncology

## 2014-04-06 ENCOUNTER — Telehealth: Payer: Self-pay | Admitting: Cardiology

## 2014-04-06 NOTE — Telephone Encounter (Signed)
Will fax order to [facility] as requested [phone]

## 2014-04-06 NOTE — Telephone Encounter (Signed)
per Gerald Stabs to sch pt CC-pt aware

## 2014-04-06 NOTE — Telephone Encounter (Signed)
New Msg          Pt calling, would like a referral to have a hearing test completed. Please call.

## 2014-04-12 ENCOUNTER — Ambulatory Visit (INDEPENDENT_AMBULATORY_CARE_PROVIDER_SITE_OTHER): Payer: Medicare Other | Admitting: Cardiology

## 2014-04-12 VITALS — BP 170/90 | HR 75 | Ht 60.0 in | Wt 126.0 lb

## 2014-04-12 DIAGNOSIS — I482 Chronic atrial fibrillation, unspecified: Secondary | ICD-10-CM

## 2014-04-12 DIAGNOSIS — E785 Hyperlipidemia, unspecified: Secondary | ICD-10-CM

## 2014-04-12 DIAGNOSIS — E038 Other specified hypothyroidism: Secondary | ICD-10-CM

## 2014-04-12 DIAGNOSIS — I119 Hypertensive heart disease without heart failure: Secondary | ICD-10-CM

## 2014-04-12 MED ORDER — AZITHROMYCIN 250 MG PO TABS: ORAL_TABLET | ORAL | Status: DC

## 2014-04-12 NOTE — Progress Notes (Signed)
Deborah [name] Date of Birth:  1927/01/01 [facility] [address] [place], [place]  [address] [phone]        Fax   [phone]   History of Present Illness: This pleasant 81 year old woman is seen back for a scheduled followup office visit. . She has a history of previous mechanical aortic valve replacement in 1996 and she has a history of chronic atrial fibrillation and is on Coumadin. She does not have any ischemic heart disease and she had a normal nuclear stress test in March 2012. She has not been experiencing any new cardiac symptoms. She does have a past history of high blood pressure. The patient has had significant GI problems and is followed closely by Dr. Watt Climes. She is on iron and her hemoglobin has been improving slowly and she has not been aware of any recent hematochezia. Her stools are chronically dark. Since last visit her energy level has improved slightly and she has had no new cardiac symptoms. She has a past history of iron deficiency anemia and is seeing Dr. Griffith Citron who plans to give her intravenous iron if her hemoglobin drops below 10.0.  She has required periodic blood transfusions for symptomatic anemia.  When her INR is high she often notes worsening melena.  Current Outpatient Prescriptions  Medication Sig Dispense Refill  . allopurinol (ZYLOPRIM) 300 MG tablet Take 300 mg by mouth daily.    Marland Kitchen allopurinol (ZYLOPRIM) 300 MG tablet TAKE ONE TABLET EACH DAY 30 tablet 1  . calcium-vitamin D 500 MG tablet Take 1 tablet by mouth 2 (two) times daily.      . CELEBREX 200 MG capsule TAKE ONE CAPSULE EACH DAY 30 capsule 5  . celecoxib (CELEBREX) 200 MG capsule Take 200 mg by mouth daily.    Marland Kitchen COUMADIN 5 MG tablet TAKE ONE TABLET EACH DAY OR TAKE AS DIRECTED BY COUMADIN CLINIC 30 tablet 6  . dicyclomine (BENTYL) 10 MG capsule Take 10 mg by mouth as needed. For stomach cramps    . fexofenadine (ALLEGRA ALLERGY) 180 MG tablet Take 180 mg by mouth  daily. For allergies. As needed    . furosemide (LASIX) 40 MG tablet TAKE ONE TABLET EACH DAY 30 tablet 6  . LANOXIN 250 MCG tablet TAKE ONE-HALF TABLET DAILY 30 tablet 3  . lansoprazole (PREVACID) 30 MG capsule Take 30 mg by mouth daily.      Marland Kitchen levothyroxine (SYNTHROID, LEVOTHROID) 88 MCG tablet Take 88 mcg by mouth daily before breakfast.    . metoprolol tartrate (LOPRESSOR) 25 MG tablet ONE-HALF TABLET IN THE MORNING AND TAKE ONE TABLET IN THE EVENING 45 tablet 0  . multivitamin (THERAGRAN) per tablet Take 1 tablet by mouth daily.     . Naphazoline-Pheniramine (OPCON-A) 0.027-0.315 % SOLN Apply 1 drop to eye 2 (two) times daily as needed (itchy eyes).    . nitroGLYCERIN (NITROSTAT) 0.4 MG SL tablet Place 0.4 mg under the tongue every 5 (five) minutes as needed for chest pain.    . Omega-3 Fatty Acids (FISH OIL) 1000 MG CAPS Take 1,000 mg by mouth 2 (two) times daily.     . potassium chloride SA (K-DUR,KLOR-CON) 20 MEQ tablet TAKE ONE TABLET EVERY DAY 30 tablet 6  . simvastatin (ZOCOR) 20 MG tablet Take 10 mg by mouth daily.    . simvastatin (ZOCOR) 20 MG tablet TAKE ONE-HALF TABLET DAILY 30 tablet 3  . SYNTHROID 88 MCG tablet TAKE ONE TABLET EACH DAY 30  tablet 1  . TRIBENZOR 40-5-12.5 MG TABS TAKE ONE TABLET EVERY DAY 30 tablet 6  . zolpidem (AMBIEN CR) 12.5 MG CR tablet TAKE ONE TABLET AT BEDTIME AS NEEDED 30 tablet 5  . azithromycin (ZITHROMAX) 250 MG tablet 2 tablets on day one and then one daily until finished 6 each 0   No current facility-administered medications for this visit.    Allergies  Allergen Reactions  . Amiodarone Shortness Of Breath  . Alendronate Sodium     GI upset  . Macrodantin [Nitrofurantoin]     GI upset  . Meclizine Swelling    tongue  . Norvasc [Amlodipine Besylate]     edema  . Tussionex Pennkinetic Er [Hydrocod Polst-Cpm Polst Er] Other (See Comments)    Reaction unknown    Patient Active Problem List   Diagnosis Date Noted  . S/P aortic valve  replacement with metallic valve 07/37/1062    Priority: Medium  . Melena 05/19/2013  . Coagulopathy 01/27/2013  . Iron deficiency anemia due to chronic blood loss 07/24/2012  . Malaise and fatigue 05/21/2012  . Dyslipidemia 12/04/2011  . Atrial fibrillation 09/06/2011  . Encounter for long-term (current) use of anticoagulants 08/08/2011  . Nonsustained ventricular tachycardia 07/21/2011  . Hypokalemia 07/20/2011  . Hyponatremia 07/20/2011  . Bronchitis 07/20/2011  . Hypothyroidism   . Benign hypertensive heart disease without heart failure     History  Smoking status  . Former Smoker -- 1.00 packs/day for 20 years  . Types: Cigarettes  . Quit date: 04/01/1988  Smokeless tobacco  . Never Used    History  Alcohol Use  . 4.8 oz/week  . 4 Glasses of wine, 4 Shots of liquor per week    Comment: burbon or glass wine daily    Family History  Problem Relation Age of Onset  . Dementia Mother   . Coronary artery disease Father     Review of Systems: Constitutional: no fever chills diaphoresis or fatigue or change in weight.  Head and neck: no hearing loss, no epistaxis, no photophobia or visual disturbance. Respiratory: No cough, shortness of breath or wheezing. Cardiovascular: No chest pain peripheral edema, palpitations. Gastrointestinal: Positive for intermittent melena Genitourinary: No dysuria, no frequency, no urgency, no nocturia. Musculoskeletal:No arthralgias, no back pain, no gait disturbance or myalgias. Neurological: No dizziness, no headaches, no numbness, no seizures, no syncope, no weakness, no tremors. Hematologic: No lymphadenopathy, no easy bruising. Psychiatric: No confusion, no hallucinations, no sleep disturbance.    Physical Exam: Filed Vitals:   04/12/14 1625  BP: 170/90  Pulse: 75   the general appearance reveals a well-developed well-nourished elderly woman in no distress.The head and neck exam reveals pupils equal and reactive.  Extraocular  movements are full.  There is no scleral icterus.  The mouth and pharynx are normal.  The neck is supple.  The carotids reveal no bruits.  The jugular venous pressure is normal.  The  thyroid is not enlarged.  There is no lymphadenopathy.  The chest is clear to percussion and auscultation.  There are no rales or rhonchi.  Expansion of the chest is symmetrical.  The precordium is quiet.  The pulse is irregularly irregular The first heart sound is normal.  The second heart sound is a mechanical aortic closure sound is crisp.  There is no diastolic murmur.  There is a soft systolic murmur across the prosthetic aortic valve.  There is no abnormal lift or heave.  The abdomen is soft and nontender.  The bowel sounds are normal.  The liver and spleen are not enlarged.  There are no abdominal masses.  There are no abdominal bruits.  Extremities reveal good pedal pulses.  There is trace edema.  There is no cyanosis or clubbing.  Strength is normal and symmetrical in all extremities.  There is no lateralizing weakness.  There are no sensory deficits.  The skin is warm and dry.  There is no rash.  EKG today shows atrial fibrillation with controlled ventricular response.  There are widespread ST-T wave abnormalities consistent with inferolateral ischemia or digitalis effect, unchanged from 03/10/12  Assessment / Plan: 1.  Mechanical aortic valve replacement in 1996. 2. essential hypertension 3. chronic atrial fibrillation on Coumadin.  We should aim for an INR target between 2.0 and 3.0, preferably toward the low side to diminish the frequency of symptomatic melena 4. ongoing occult GI blood loss with iron deficiency anemia, followed by Dr. Watt Climes and Dr. Jana Hakim 5.  Hypothyroidism 6. Hypercholesterolemia  Plan: Continue current medication. She should aim for an INR between 2.0 and 3.0 for her mechanical aortic valve prosthesis.  Toward the lower end of the range would be [name] in view of her recurrent GI  bleeding. We will ask her to get additional lab work when she has her next venipuncture at the [facility].  We would like to check a lipid panel, basal metabolic panel, hepatic function panel, TSH, and free T4.  The cancer center will be checking a ferritin level. Recheck here in 4 months for office visit. She is planning a trip to [place] and we gave her a Z-Pak prescription to carry with her in case she gets sick from the plane ride.

## 2014-04-12 NOTE — Patient Instructions (Signed)
YOUR INR GOAL SHOULD BE 2.0-3.0  Get fasting labs at your next lab appointment, Rx given   Your physician wants you to follow-up in: 4 month ov You will receive a reminder letter in the mail two months in advance. If you don't receive a letter, please call our office to schedule the follow-up appointment.

## 2014-04-17 ENCOUNTER — Other Ambulatory Visit: Payer: Self-pay | Admitting: Oncology

## 2014-04-17 DIAGNOSIS — E038 Other specified hypothyroidism: Secondary | ICD-10-CM

## 2014-04-19 ENCOUNTER — Ambulatory Visit (HOSPITAL_BASED_OUTPATIENT_CLINIC_OR_DEPARTMENT_OTHER): Payer: Self-pay | Admitting: Pharmacist

## 2014-04-19 ENCOUNTER — Other Ambulatory Visit (HOSPITAL_BASED_OUTPATIENT_CLINIC_OR_DEPARTMENT_OTHER): Payer: Medicare Other

## 2014-04-19 ENCOUNTER — Telehealth: Payer: Self-pay | Admitting: *Deleted

## 2014-04-19 ENCOUNTER — Telehealth: Payer: Self-pay | Admitting: Oncology

## 2014-04-19 DIAGNOSIS — R5383 Other fatigue: Secondary | ICD-10-CM

## 2014-04-19 DIAGNOSIS — D509 Iron deficiency anemia, unspecified: Secondary | ICD-10-CM

## 2014-04-19 DIAGNOSIS — Z7901 Long term (current) use of anticoagulants: Secondary | ICD-10-CM

## 2014-04-19 DIAGNOSIS — R5381 Other malaise: Secondary | ICD-10-CM

## 2014-04-19 DIAGNOSIS — D689 Coagulation defect, unspecified: Secondary | ICD-10-CM

## 2014-04-19 DIAGNOSIS — E039 Hypothyroidism, unspecified: Secondary | ICD-10-CM

## 2014-04-19 DIAGNOSIS — E038 Other specified hypothyroidism: Secondary | ICD-10-CM

## 2014-04-19 DIAGNOSIS — D500 Iron deficiency anemia secondary to blood loss (chronic): Secondary | ICD-10-CM

## 2014-04-19 DIAGNOSIS — Z954 Presence of other heart-valve replacement: Secondary | ICD-10-CM

## 2014-04-19 LAB — COMPREHENSIVE METABOLIC PANEL (CC13)
ALK PHOS: 64 U/L (ref 40–150)
ALT: 20 U/L (ref 0–55)
AST: 19 U/L (ref 5–34)
Albumin: 3.6 g/dL (ref 3.5–5.0)
Anion Gap: 7 mEq/L (ref 3–11)
BILIRUBIN TOTAL: 0.3 mg/dL (ref 0.20–1.20)
BUN: 21.9 mg/dL (ref 7.0–26.0)
CO2: 28 mEq/L (ref 22–29)
CREATININE: 0.7 mg/dL (ref 0.6–1.1)
Calcium: 9.6 mg/dL (ref 8.4–10.4)
Chloride: 96 mEq/L — ABNORMAL LOW (ref 98–109)
EGFR: 74 mL/min/{1.73_m2} — ABNORMAL LOW (ref >90)
Glucose: 97 mg/dl (ref 70–140)
Potassium: 4.4 mEq/L (ref 3.5–5.1)
Sodium: 132 mEq/L — ABNORMAL LOW (ref 136–145)
Total Protein: 6 g/dL — ABNORMAL LOW (ref 6.4–8.3)

## 2014-04-19 LAB — CBC WITH DIFFERENTIAL/PLATELET
BASO%: 0.4 % (ref 0.0–2.0)
BASOS ABS: 0 10*3/uL (ref 0.0–0.1)
EOS%: 0.9 % (ref 0.0–7.0)
Eosinophils Absolute: 0 10*3/uL (ref 0.0–0.5)
HCT: 34.4 % — ABNORMAL LOW (ref 34.8–46.6)
HEMOGLOBIN: 10.8 g/dL — AB (ref 11.6–15.9)
LYMPH%: 9 % — ABNORMAL LOW (ref 14.0–49.7)
MCH: 30.9 pg (ref 25.1–34.0)
MCHC: 31.5 g/dL (ref 31.5–36.0)
MCV: 98.1 fL (ref 79.5–101.0)
MONO#: 0.3 10*3/uL (ref 0.1–0.9)
MONO%: 6.5 % (ref 0.0–14.0)
NEUT#: 3.9 10*3/uL (ref 1.5–6.5)
NEUT%: 83.2 % — ABNORMAL HIGH (ref 38.4–76.8)
Platelets: 208 10*3/uL (ref 145–400)
RBC: 3.51 10*6/uL — ABNORMAL LOW (ref 3.70–5.45)
RDW: 16.5 % — ABNORMAL HIGH (ref 11.2–14.5)
WBC: 4.7 10*3/uL (ref 3.9–10.3)
lymph#: 0.4 10*3/uL — ABNORMAL LOW (ref 0.9–3.3)

## 2014-04-19 LAB — PROTIME-INR
INR: 2.8 (ref 2.00–3.50)
Protime: 33.6 Seconds — ABNORMAL HIGH (ref 10.6–13.4)

## 2014-04-19 LAB — T4: T4, Total: 8.5 ug/dL (ref 4.5–12.0)

## 2014-04-19 LAB — LIPID PANEL
Cholesterol: 143 mg/dL (ref 0–200)
HDL: 63 mg/dL (ref >39)
LDL Cholesterol: 58 mg/dL (ref 0–99)
Total CHOL/HDL Ratio: 2.3 Ratio
Triglycerides: 108 mg/dL (ref <150)
VLDL: 22 mg/dL (ref 0–40)

## 2014-04-19 LAB — TSH CHCC: TSH: 1.915 m[IU]/L (ref 0.308–3.960)

## 2014-04-19 LAB — POCT INR: INR: 2.8

## 2014-04-19 LAB — FERRITIN CHCC: FERRITIN: 52 ng/mL (ref 9–269)

## 2014-04-19 NOTE — Telephone Encounter (Signed)
per Gerald Stabs to sch pt CC-pt aware

## 2014-04-19 NOTE — Patient Instructions (Signed)
INR at goal Continue Coumadin 5mg  daily except 2.5mg  on Tuesdays and Fridays.  Recheck INR on 05/09/14:  lab at 10:30am & Coumadin clinic at 10:45am.

## 2014-04-19 NOTE — Progress Notes (Signed)
INR at goal Deborah [name] is doing well today with no complaints No unusual bleeding or bruising Dark stools are improved this week per patient No missed or extra dosing No medication or diet changes Deborah [name] is leaving for [place] with her family for 2 weeks We will see her when she gets back Hgb is ok at 10.8. Plan: Continue Coumadin 5mg  daily except 2.5mg  on Tuesdays and Fridays.  Recheck INR on 05/09/14:  lab at 10:30am & Coumadin clinic at 10:45am.

## 2014-04-20 ENCOUNTER — Ambulatory Visit (HOSPITAL_COMMUNITY)
Admission: RE | Admit: 2014-04-20 | Discharge: 2014-04-20 | Disposition: A | Discharge status: Home / Self Care | Payer: Medicare Other | Source: Ambulatory Visit | Attending: Oncology | Admitting: Oncology

## 2014-04-20 ENCOUNTER — Other Ambulatory Visit: Payer: Self-pay | Admitting: *Deleted

## 2014-04-20 ENCOUNTER — Other Ambulatory Visit: Payer: Self-pay | Admitting: Nurse Practitioner

## 2014-04-20 ENCOUNTER — Other Ambulatory Visit: Payer: Medicare Other

## 2014-04-20 VITALS — BP 154/82 | HR 71 | Temp 98.5°F | Resp 18

## 2014-04-20 DIAGNOSIS — D500 Iron deficiency anemia secondary to blood loss (chronic): Secondary | ICD-10-CM

## 2014-04-20 LAB — PREPARE RBC (CROSSMATCH)

## 2014-04-20 MED ORDER — HEPARIN SOD (PORK) LOCK FLUSH 100 UNIT/ML IV SOLN: 250.0000 [IU] | INTRAVENOUS | Status: DC | PRN

## 2014-04-20 MED ORDER — SODIUM CHLORIDE 0.9 % IV SOLN
250.0000 mL | Freq: Once | INTRAVENOUS | Status: AC
  Administered 2014-04-20: 250 mL via INTRAVENOUS

## 2014-04-20 MED ORDER — DIPHENHYDRAMINE HCL 25 MG PO CAPS
25.0000 mg | ORAL_CAPSULE | Freq: Once | ORAL | Status: AC
  Administered 2014-04-20: 25 mg via ORAL
  Filled 2014-04-20: qty 1

## 2014-04-20 MED ORDER — HEPARIN SOD (PORK) LOCK FLUSH 100 UNIT/ML IV SOLN: 500.0000 [IU] | Freq: Every day | INTRAVENOUS | Status: DC | PRN

## 2014-04-20 MED ORDER — ACETAMINOPHEN 325 MG PO TABS
650.0000 mg | ORAL_TABLET | Freq: Once | ORAL | Status: AC
  Administered 2014-04-20: 650 mg via ORAL
  Filled 2014-04-20: qty 2

## 2014-04-20 MED ORDER — SODIUM CHLORIDE 0.9 % IV SOLN
510.0000 mg | Freq: Once | INTRAVENOUS | Status: DC
  Filled 2014-04-20: qty 17

## 2014-04-20 MED ORDER — SODIUM CHLORIDE 0.9 % IJ SOLN: 3.0000 mL | INTRAMUSCULAR | Status: DC | PRN

## 2014-04-20 MED ORDER — SODIUM CHLORIDE 0.9 % IV SOLN
510.0000 mg | INTRAVENOUS | Status: AC
  Administered 2014-04-20: 510 mg via INTRAVENOUS
  Filled 2014-04-20: qty 17

## 2014-04-20 MED ORDER — SODIUM CHLORIDE 0.9 % IJ SOLN: 10.0000 mL | INTRAMUSCULAR | Status: DC | PRN

## 2014-04-20 NOTE — Progress Notes (Addendum)
Deborah [name] UNG:[id number] DOB: 05-15-1926 DOA: 04/20/2014  PCP: [facility]  Associated Diagnosis:   Procedure Note: transfused 1 unit of blood, iv  feraheme    Condition During Procedure: Pt tolerated well   Condition at Discharge: Pt alert, oriented, ambulatory; no complications noted

## 2014-04-20 NOTE — Progress Notes (Signed)
Pt called to this RN to state concern for noted SOB with heme 10 and iron level of 52.  Aulani states she is preparing to go out of town on 04/22/2014 and feels need for blood and iron.  Pt per Dr Jannifer Rodney usually obtains 1 unit of blood if SOB with heme of 10 and iron infusion give to maintain feraheme above 100 for best function in this patient.  This RN informed Dr Vernell Leep RN. Will proceed with above plan.  Pt scheduled for lab here for [facility] and then will proceed to [facility] for blood and iron infusion.  Pt aware of above.

## 2014-04-21 LAB — TYPE AND SCREEN
ABO/RH(D): O POS
Antibody Screen: NEGATIVE
Unit division: 0

## 2014-04-26 ENCOUNTER — Telehealth: Payer: Self-pay | Admitting: Oncology

## 2014-04-26 NOTE — Telephone Encounter (Signed)
per Gerald Stabs in [place] to sch pt CC-pt aware

## 2014-05-03 ENCOUNTER — Ambulatory Visit: Payer: Medicare Other

## 2014-05-03 ENCOUNTER — Other Ambulatory Visit: Payer: Medicare Other

## 2014-05-07 ENCOUNTER — Other Ambulatory Visit: Payer: Self-pay | Admitting: Cardiology

## 2014-05-09 ENCOUNTER — Ambulatory Visit (HOSPITAL_COMMUNITY)
Admission: RE | Admit: 2014-05-09 | Discharge: 2014-05-09 | Disposition: A | Discharge status: Home / Self Care | Payer: Medicare Other | Source: Ambulatory Visit | Attending: Oncology | Admitting: Oncology

## 2014-05-09 ENCOUNTER — Other Ambulatory Visit: Payer: Self-pay

## 2014-05-09 ENCOUNTER — Encounter: Payer: Self-pay | Admitting: *Deleted

## 2014-05-09 ENCOUNTER — Other Ambulatory Visit (HOSPITAL_COMMUNITY)
Admission: RE | Admit: 2014-05-09 | Discharge: 2014-05-09 | Disposition: A | Discharge status: Home / Self Care | Payer: Medicare Other | Source: Ambulatory Visit | Attending: Oncology | Admitting: Oncology

## 2014-05-09 ENCOUNTER — Other Ambulatory Visit (HOSPITAL_BASED_OUTPATIENT_CLINIC_OR_DEPARTMENT_OTHER): Payer: Medicare Other

## 2014-05-09 ENCOUNTER — Telehealth: Payer: Self-pay | Admitting: *Deleted

## 2014-05-09 ENCOUNTER — Ambulatory Visit (HOSPITAL_BASED_OUTPATIENT_CLINIC_OR_DEPARTMENT_OTHER): Payer: Self-pay | Admitting: Pharmacist

## 2014-05-09 VITALS — BP 130/52 | HR 84 | Temp 98.3°F | Resp 18

## 2014-05-09 DIAGNOSIS — D689 Coagulation defect, unspecified: Secondary | ICD-10-CM

## 2014-05-09 DIAGNOSIS — K921 Melena: Secondary | ICD-10-CM | POA: Diagnosis present

## 2014-05-09 DIAGNOSIS — D500 Iron deficiency anemia secondary to blood loss (chronic): Secondary | ICD-10-CM | POA: Insufficient documentation

## 2014-05-09 DIAGNOSIS — Z7901 Long term (current) use of anticoagulants: Secondary | ICD-10-CM

## 2014-05-09 DIAGNOSIS — Z954 Presence of other heart-valve replacement: Secondary | ICD-10-CM

## 2014-05-09 DIAGNOSIS — D649 Anemia, unspecified: Secondary | ICD-10-CM

## 2014-05-09 LAB — CBC WITH DIFFERENTIAL/PLATELET
BASO%: 0.2 % (ref 0.0–2.0)
BASOS ABS: 0 10*3/uL (ref 0.0–0.1)
EOS%: 0.2 % (ref 0.0–7.0)
Eosinophils Absolute: 0 10*3/uL (ref 0.0–0.5)
HCT: 19.5 % — ABNORMAL LOW (ref 34.8–46.6)
HGB: 6.1 g/dL — CL (ref 11.6–15.9)
LYMPH#: 0.8 10*3/uL — AB (ref 0.9–3.3)
LYMPH%: 12.6 % — AB (ref 14.0–49.7)
MCH: 32.6 pg (ref 25.1–34.0)
MCHC: 31.3 g/dL — ABNORMAL LOW (ref 31.5–36.0)
MCV: 104.3 fL — AB (ref 79.5–101.0)
MONO#: 0.4 10*3/uL (ref 0.1–0.9)
MONO%: 6 % (ref 0.0–14.0)
NEUT%: 81 % — AB (ref 38.4–76.8)
NEUTROS ABS: 5 10*3/uL (ref 1.5–6.5)
PLATELETS: 156 10*3/uL (ref 145–400)
RBC: 1.87 10*6/uL — ABNORMAL LOW (ref 3.70–5.45)
RDW: 19.7 % — ABNORMAL HIGH (ref 11.2–14.5)
WBC: 6.2 10*3/uL (ref 3.9–10.3)

## 2014-05-09 LAB — HOLD TUBE, BLOOD BANK

## 2014-05-09 LAB — POCT INR: INR: 5.06

## 2014-05-09 LAB — PROTIME-INR
INR: 5.06 — AB (ref 0.00–1.49)
Prothrombin Time: 47.2 seconds — ABNORMAL HIGH (ref 11.6–15.2)

## 2014-05-09 LAB — PREPARE RBC (CROSSMATCH)

## 2014-05-09 MED ORDER — SODIUM CHLORIDE 0.9 % IV SOLN: 250.0000 mL | Freq: Once | INTRAVENOUS | Status: DC

## 2014-05-09 MED ORDER — ACETAMINOPHEN 325 MG PO TABS
650.0000 mg | ORAL_TABLET | Freq: Once | ORAL | Status: AC
  Administered 2014-05-09: 650 mg via ORAL
  Filled 2014-05-09: qty 2

## 2014-05-09 MED ORDER — DIPHENHYDRAMINE HCL 25 MG PO CAPS
25.0000 mg | ORAL_CAPSULE | Freq: Once | ORAL | Status: AC
  Administered 2014-05-09: 25 mg via ORAL
  Filled 2014-05-09: qty 1

## 2014-05-09 MED ORDER — SODIUM CHLORIDE 0.9 % IV SOLN
510.0000 mg | Freq: Once | INTRAVENOUS | Status: AC
  Administered 2014-05-09: 510 mg via INTRAVENOUS
  Filled 2014-05-09: qty 17

## 2014-05-09 NOTE — Progress Notes (Signed)
INR above goal today at 5.06 Pt is fatigued and short of breath She appears pale in color Hgb is very low at 6.1 Per Dr. Jana Hakim pt to receive 2 units PRBCs and 1 dose of feraheme This will be done at sickle cell clinic due to lack of space at infusion center No missed or extra doses No other medication or diet changes Pt was down in [place] with family for 2 weeks and felt great until the end of the trip when she starting noticing more dark stools Pt states she has been having more dark stools that she says have been "very dark/black" for the last week This is likely the result of the low Hgb and blood loss Pt has scheduled lab next week. I will make adjustment to patients coumadin dosing but unsure why such an elevated reading this week Plan: Hold coumadin x 3 days. Then take 2.5 mg (half tablet) daily this week. Recheck INR next week on 05/17/14:  lab at 10:30am & Coumadin clinic at 10:45am.

## 2014-05-09 NOTE — Telephone Encounter (Signed)
Panic INR called to [facility] in Coumadin Clinic by Rolland Porter at 1250 05/09/14

## 2014-05-09 NOTE — Progress Notes (Unsigned)
Critical INR reported to [facility] 1250 05/09/14 by Rolland Porter

## 2014-05-09 NOTE — Patient Instructions (Signed)
INR above goal You will receive 2 units of blood today and Iron Hold coumadin x 3 days.  Then take 2.5 mg (half tablet) daily this week. Recheck INR next week on 05/17/14:  lab at 10:30am & Coumadin clinic at 10:45am.

## 2014-05-09 NOTE — Procedures (Signed)
[facility]  Procedure Note  Deborah [name] OIT:[id number] DOB: 01/01/1927 DOA: 05/09/2014   PCP: Dr. Jana Hakim  Associated Diagnosis: Iron deficiency anemia due to chronic blood loss  Procedure Note: IV started by IV team, 2 units of PRBC's transfused per order, as well as Iron   Condition During Procedure: patient stable, denies any discomfort while at [facility]   Condition at Discharge:  Patient ambulatory, daughter at side.   Roberto Scales, RN  [facility]

## 2014-05-10 ENCOUNTER — Telehealth: Payer: Self-pay | Admitting: Oncology

## 2014-05-10 ENCOUNTER — Telehealth: Payer: Self-pay | Admitting: Cardiology

## 2014-05-10 LAB — TYPE AND SCREEN
ABO/RH(D): O POS
Antibody Screen: NEGATIVE
Unit division: 0
Unit division: 0

## 2014-05-10 NOTE — Telephone Encounter (Signed)
New Msg          Pt calling, states she has some questions. No details given,.   Please return call.

## 2014-05-10 NOTE — Telephone Encounter (Signed)
per Marianne Sofia to sch CC-pt aware

## 2014-05-10 NOTE — Telephone Encounter (Signed)
Patient had low hgb and was transfused yesterday Continues to have shortness of breath, able to hear heart beat, and black stools Scheduled follow up ov Thursday  Advised patient if worse to call Dr Virgie Dad office tomorrow

## 2014-05-11 NOTE — Telephone Encounter (Signed)
Followed up with patient today and she is feeling no worse but not better Advised patient if worse to go to ED, verbalized understanding.

## 2014-05-12 ENCOUNTER — Encounter: Payer: Self-pay | Admitting: Cardiology

## 2014-05-12 ENCOUNTER — Ambulatory Visit (INDEPENDENT_AMBULATORY_CARE_PROVIDER_SITE_OTHER): Payer: Medicare Other | Admitting: Cardiology

## 2014-05-12 VITALS — BP 112/60 | HR 61 | Ht 60.0 in | Wt 126.1 lb

## 2014-05-12 DIAGNOSIS — D500 Iron deficiency anemia secondary to blood loss (chronic): Secondary | ICD-10-CM

## 2014-05-12 DIAGNOSIS — I482 Chronic atrial fibrillation, unspecified: Secondary | ICD-10-CM

## 2014-05-12 DIAGNOSIS — I119 Hypertensive heart disease without heart failure: Secondary | ICD-10-CM

## 2014-05-12 DIAGNOSIS — Z954 Presence of other heart-valve replacement: Secondary | ICD-10-CM

## 2014-05-12 LAB — HEPATIC FUNCTION PANEL
ALBUMIN: 3.4 g/dL — AB (ref 3.5–5.2)
ALT: 22 U/L (ref 0–35)
AST: 22 U/L (ref 0–37)
Alkaline Phosphatase: 44 U/L (ref 39–117)
Bilirubin, Direct: 0.1 mg/dL (ref 0.0–0.3)
Total Bilirubin: 0.4 mg/dL (ref 0.2–1.2)
Total Protein: 5.2 g/dL — ABNORMAL LOW (ref 6.0–8.3)

## 2014-05-12 LAB — CBC WITH DIFFERENTIAL/PLATELET
BASOS ABS: 0 10*3/uL (ref 0.0–0.1)
Basophils Relative: 0.2 % (ref 0.0–3.0)
Eosinophils Absolute: 0 10*3/uL (ref 0.0–0.7)
Eosinophils Relative: 0.4 % (ref 0.0–5.0)
HCT: 23.6 % — CL (ref 36.0–46.0)
Hemoglobin: 7.9 g/dL — CL (ref 12.0–15.0)
LYMPHS ABS: 0.5 10*3/uL — AB (ref 0.7–4.0)
LYMPHS PCT: 8.1 % — AB (ref 12.0–46.0)
MCHC: 33.5 g/dL (ref 30.0–36.0)
MCV: 98 fl (ref 78.0–100.0)
Monocytes Absolute: 0.5 10*3/uL (ref 0.1–1.0)
Monocytes Relative: 7.5 % (ref 3.0–12.0)
Neutro Abs: 5.2 10*3/uL (ref 1.4–7.7)
Neutrophils Relative %: 83.8 % — ABNORMAL HIGH (ref 43.0–77.0)
PLATELETS: 183 10*3/uL (ref 150.0–400.0)
RDW: 22.4 % — AB (ref 11.5–15.5)
WBC: 6.2 10*3/uL (ref 4.0–10.5)

## 2014-05-12 LAB — BASIC METABOLIC PANEL
BUN: 20 mg/dL (ref 6–23)
CHLORIDE: 98 meq/L (ref 96–112)
CO2: 30 mEq/L (ref 19–32)
Calcium: 9.4 mg/dL (ref 8.4–10.5)
Creatinine, Ser: 0.69 mg/dL (ref 0.40–1.20)
GFR: 85.88 mL/min (ref >60.00)
GLUCOSE: 127 mg/dL — AB (ref 70–99)
Potassium: 4.2 mEq/L (ref 3.5–5.1)
Sodium: 132 mEq/L — ABNORMAL LOW (ref 135–145)

## 2014-05-12 LAB — PROTIME-INR
INR: 1.5 ratio — ABNORMAL HIGH (ref 0.8–1.0)
Prothrombin Time: 16.8 s — ABNORMAL HIGH (ref 9.6–13.1)

## 2014-05-12 NOTE — Progress Notes (Signed)
Cardiology Office Note   Date:  05/12/2014   ID:  Deborah [name], DOB [birth year], MRN [id number]  PCP:  Darlin Coco, MD  Cardiologist:   Darlin Coco, MD   No chief complaint on file.     History of Present Illness: Deborah [name] is a 81 y.o. female who presents for a work in office visit.  This pleasant 81 year old woman is seen back for a work in office visit. . She has a history of previous mechanical aortic valve replacement in 1996 and she has a history of chronic atrial fibrillation and is on Coumadin. She does not have any ischemic heart disease and she had a normal nuclear stress test in March 2012. She has not been experiencing any new cardiac symptoms. She does have a past history of high blood pressure.  The patient has a history of intermittent GI bleeding with intermittent melena.  She is followed closely by Dr. Watt Climes and by her oncologist Dr. Griffith Citron.  Recently she has had worsening anemia.  He has had more melena.  She had a hemoglobin on 05/09/14 which was down to 6.1 and her INR was greater than 5.  Her Coumadin has been on hold for the past several days.  She received 2 units of packed cells on 05/09/14 and her energy level has improved slightly.  She still complains of lack of energy.  He is not having any diarrhea.  She has one dark bowel movement a day.  Her abdomen has felt tight at times.  She has not had any significant weight change.  He has noted increased thirst and some blurring of her vision. Past Medical History  Diagnosis Date  . Hypothyroidism   . Stomach problems     seeing Dr Watt Climes  . Hypertension   . GERD (gastroesophageal reflux disease)   . Anemia   . Arrhythmia     Atrial Fibrillation  . Breast cancer     "right; S/P lumpectomy, chemo, XRT"   . Complication of anesthesia     "once I'm awakened, I don't sleep til the following night"  . High cholesterol   . Heart murmur   . History of blood transfusion 1996; ~ 2013    "related to  valve replacement; GI bleeding"   . Chronic lower GI bleeding   . Arthritis     "fingers, toes, hips; qwhere" (05/19/2013)  . Gout   . Pneumonia 07/2011    Past Surgical History  Procedure Laterality Date  . Arteriogram  01/12    NO RAS   . Cardioversion      X 2  . Cardiac catheterization    . Cataract extraction w/ intraocular lens  implant, bilateral Bilateral     bilateral cataract with lens implants  . Cholecystectomy    . Abdominal hysterectomy    . Appendectomy    . Colonoscopy with propofol N/A 11/24/2012    Procedure: COLONOSCOPY WITH PROPOFOL;  Surgeon: Jeryl Columbia, MD;  Location: [facility];  Service: Endoscopy;  Laterality: N/A;  . Breast biopsy Right   . Breast lumpectomy Right   . Aortic valve replacement  1996    [employer]  . Cardiac valve replacement  1996     Current Outpatient Prescriptions  Medication Sig Dispense Refill  . allopurinol (ZYLOPRIM) 300 MG tablet TAKE ONE TABLET EVERY DAY 30 tablet 1  . calcium-vitamin D 500 MG tablet Take 1 tablet by mouth 2 (two) times daily.      Marland Kitchen  CELEBREX 200 MG capsule TAKE ONE CAPSULE EACH DAY 30 capsule 5  . celecoxib (CELEBREX) 200 MG capsule Take 200 mg by mouth daily.    Marland Kitchen COUMADIN 5 MG tablet TAKE ONE TABLET EACH DAY OR TAKE AS DIRECTED BY COUMADIN CLINIC 30 tablet 6  . dicyclomine (BENTYL) 10 MG capsule Take 10 mg by mouth as needed. For stomach cramps    . fexofenadine (ALLEGRA ALLERGY) 180 MG tablet Take 180 mg by mouth daily. For allergies. As needed    . furosemide (LASIX) 40 MG tablet TAKE ONE TABLET EACH DAY 30 tablet 6  . LANOXIN 250 MCG tablet TAKE 1/2 TABLET DAILY 30 tablet 1  . lansoprazole (PREVACID) 30 MG capsule Take 30 mg by mouth daily.      . metoprolol tartrate (LOPRESSOR) 25 MG tablet TAKE ONE-HALF TABLET IN THE MORNING AND TAKE ONE TABLET IN THE EVENING 45 tablet 1  . multivitamin (THERAGRAN) per tablet Take 1 tablet by mouth daily.     . Naphazoline-Pheniramine (OPCON-A) 0.027-0.315 % SOLN  Apply 1 drop to eye 2 (two) times daily as needed (itchy eyes).    Marland Kitchen NITROSTAT 0.4 MG SL tablet ONE TABLET UNDER TONGUE AS NEEDED 25 tablet 3  . Omega-3 Fatty Acids (FISH OIL) 1000 MG CAPS Take 1,000 mg by mouth 2 (two) times daily.     . potassium chloride SA (K-DUR,KLOR-CON) 20 MEQ tablet TAKE ONE TABLET EVERY DAY 30 tablet 6  . simvastatin (ZOCOR) 20 MG tablet Take 10 mg by mouth daily.    . simvastatin (ZOCOR) 20 MG tablet TAKE ONE-HALF TABLET DAILY 30 tablet 3  . SYNTHROID 88 MCG tablet TAKE ONE TABLET EVERY DAY 30 tablet 3  . TRIBENZOR 40-5-12.5 MG TABS TAKE ONE TABLET EVERY DAY 30 tablet 6  . zolpidem (AMBIEN CR) 12.5 MG CR tablet TAKE ONE TABLET AT BEDTIME AS NEEDED 30 tablet 5   No current facility-administered medications for this visit.    Allergies:   Amiodarone; Alendronate sodium; Macrodantin; Meclizine; Norvasc; and Tussionex pennkinetic er    Social History:  The patient  reports that she quit smoking about 26 years ago. Her smoking use included Cigarettes. She has a 20 pack-year smoking history. She has never used smokeless tobacco. She reports that she drinks about 4.8 oz of alcohol per week. She reports that she does not use illicit drugs.   Family History:  The patient's family history includes Coronary artery disease in her father; Dementia in her mother.    ROS:  Please see the history of present illness.   Otherwise, review of systems are positive for none.   All other systems are reviewed and negative.    PHYSICAL EXAM: VS:  BP 112/60 mmHg  Pulse 61  Ht 5' (1.524 m)  Wt 126 lb 1.9 oz (57.208 kg)  BMI 24.63 kg/m2 , BMI Body mass index is 24.63 kg/(m^2). GEN: Well nourished, well developed, in no acute distress HEENT: normal Neck: no JVD, carotid bruits, or masses Cardiac: Irregularly irregular.  Grade 2/6 systolic ejection murmur across the prosthetic aortic valve.  No rubs, or gallops, trace edema  Respiratory:  clear to auscultation bilaterally, normal work  of breathing GI: soft, nontender, nondistended, + BS MS: no deformity or atrophy Skin: warm and dry, no rash.  She is pale. Neuro:  Strength and sensation are intact Psych: euthymic mood, full affect   EKG:  EKG is not ordered today.    Recent Labs: 04/19/2014: TSH 1.915 05/12/2014: ALT 22;  BUN 20; Creatinine 0.69; Hemoglobin 7.9 Repeated and verified X2.*; Platelets 183.0; Potassium 4.2; Sodium 132*    Lipid Panel    Component Value Date/Time   CHOL 143 04/19/2014 1025   TRIG 108 04/19/2014 1025   HDL 63 04/19/2014 1025   CHOLHDL 2.3 04/19/2014 1025   VLDL 22 04/19/2014 1025   LDLCALC 58 04/19/2014 1025   LDLDIRECT 93.7 04/10/2011 1000      Wt Readings from Last 3 Encounters:  05/12/14 126 lb 1.9 oz (57.208 kg)  04/12/14 126 lb (57.153 kg)  10/21/13 126 lb 9.6 oz (57.425 kg)         ASSESSMENT AND PLAN:  1. Mechanical aortic valve replacement in 1996. 2. essential hypertension 3. chronic atrial fibrillation on Coumadin. We should aim for an INR target between 2.0 and 2.5, preferably toward the low side to diminish the frequency of symptomatic melena 4. ongoing occult GI blood loss with iron deficiency anemia, followed by Dr. Watt Climes and Dr. Jana Hakim. 5. Hypothyroidism 6. Hypercholesterolemia   Current medicines are reviewed at length with the patient today.  The patient does not have concerns regarding medicines.  The following changes have been made:  no change  Labs/ tests ordered today include: CBC, basal metabolic panel, hepatic function panel, and pro time   Orders Placed This Encounter  Procedures  . CBC with Differential/Platelet  . Basic metabolic panel  . Hepatic function panel  . INR/PT     Disposition:   FU with Dr. Mare Ferrari in at her regular time.   She has an appointment next week at the [facility] for follow-up of her anemia and her pro time. Venipunctures are becoming more difficult for her and she is considering requesting another  Port-A-Cath.  She has had to have frequent transfusions.  Signed, Darlin Coco, MD  05/12/2014 6:32 PM    [address], [place], [place]  [address] Phone: [phone]; Fax: [phone]

## 2014-05-12 NOTE — Patient Instructions (Signed)
Will obtain labs today and call you with the results (bmet,cbc,hfp,pt/inr)  Keep your regular appointment as scheduled

## 2014-05-13 ENCOUNTER — Encounter (HOSPITAL_COMMUNITY): Payer: Self-pay | Admitting: Emergency Medicine

## 2014-05-13 ENCOUNTER — Other Ambulatory Visit: Payer: Self-pay

## 2014-05-13 ENCOUNTER — Inpatient Hospital Stay (HOSPITAL_COMMUNITY)
Admission: EM | Admit: 2014-05-13 | Discharge: 2014-05-15 | DRG: 812 | Disposition: A | Discharge status: Home / Self Care | Payer: Medicare Other | Attending: Internal Medicine | Admitting: Internal Medicine

## 2014-05-13 ENCOUNTER — Other Ambulatory Visit: Payer: Self-pay | Admitting: Oncology

## 2014-05-13 DIAGNOSIS — M109 Gout, unspecified: Secondary | ICD-10-CM | POA: Diagnosis present

## 2014-05-13 DIAGNOSIS — Z7901 Long term (current) use of anticoagulants: Secondary | ICD-10-CM

## 2014-05-13 DIAGNOSIS — Z9841 Cataract extraction status, right eye: Secondary | ICD-10-CM

## 2014-05-13 DIAGNOSIS — E780 Pure hypercholesterolemia: Secondary | ICD-10-CM | POA: Diagnosis present

## 2014-05-13 DIAGNOSIS — Z952 Presence of prosthetic heart valve: Secondary | ICD-10-CM | POA: Diagnosis not present

## 2014-05-13 DIAGNOSIS — Z87891 Personal history of nicotine dependence: Secondary | ICD-10-CM | POA: Diagnosis not present

## 2014-05-13 DIAGNOSIS — K219 Gastro-esophageal reflux disease without esophagitis: Secondary | ICD-10-CM | POA: Diagnosis present

## 2014-05-13 DIAGNOSIS — K921 Melena: Secondary | ICD-10-CM | POA: Diagnosis present

## 2014-05-13 DIAGNOSIS — Z853 Personal history of malignant neoplasm of breast: Secondary | ICD-10-CM

## 2014-05-13 DIAGNOSIS — Z888 Allergy status to other drugs, medicaments and biological substances status: Secondary | ICD-10-CM

## 2014-05-13 DIAGNOSIS — Z79899 Other long term (current) drug therapy: Secondary | ICD-10-CM

## 2014-05-13 DIAGNOSIS — E039 Hypothyroidism, unspecified: Secondary | ICD-10-CM | POA: Diagnosis present

## 2014-05-13 DIAGNOSIS — I482 Chronic atrial fibrillation, unspecified: Secondary | ICD-10-CM | POA: Diagnosis present

## 2014-05-13 DIAGNOSIS — I10 Essential (primary) hypertension: Secondary | ICD-10-CM | POA: Diagnosis present

## 2014-05-13 DIAGNOSIS — Z8249 Family history of ischemic heart disease and other diseases of the circulatory system: Secondary | ICD-10-CM | POA: Diagnosis not present

## 2014-05-13 DIAGNOSIS — Z881 Allergy status to other antibiotic agents status: Secondary | ICD-10-CM | POA: Diagnosis not present

## 2014-05-13 DIAGNOSIS — Z961 Presence of intraocular lens: Secondary | ICD-10-CM | POA: Diagnosis present

## 2014-05-13 DIAGNOSIS — R5381 Other malaise: Secondary | ICD-10-CM | POA: Diagnosis present

## 2014-05-13 DIAGNOSIS — Z66 Do not resuscitate: Secondary | ICD-10-CM | POA: Diagnosis present

## 2014-05-13 DIAGNOSIS — I4891 Unspecified atrial fibrillation: Secondary | ICD-10-CM

## 2014-05-13 DIAGNOSIS — Z9842 Cataract extraction status, left eye: Secondary | ICD-10-CM

## 2014-05-13 DIAGNOSIS — M1990 Unspecified osteoarthritis, unspecified site: Secondary | ICD-10-CM | POA: Diagnosis present

## 2014-05-13 DIAGNOSIS — R531 Weakness: Secondary | ICD-10-CM

## 2014-05-13 DIAGNOSIS — R0602 Shortness of breath: Secondary | ICD-10-CM

## 2014-05-13 DIAGNOSIS — Z954 Presence of other heart-valve replacement: Secondary | ICD-10-CM

## 2014-05-13 DIAGNOSIS — E785 Hyperlipidemia, unspecified: Secondary | ICD-10-CM | POA: Diagnosis present

## 2014-05-13 DIAGNOSIS — E871 Hypo-osmolality and hyponatremia: Secondary | ICD-10-CM | POA: Diagnosis present

## 2014-05-13 DIAGNOSIS — R5383 Other fatigue: Secondary | ICD-10-CM

## 2014-05-13 DIAGNOSIS — K5790 Diverticulosis of intestine, part unspecified, without perforation or abscess without bleeding: Secondary | ICD-10-CM

## 2014-05-13 DIAGNOSIS — D500 Iron deficiency anemia secondary to blood loss (chronic): Principal | ICD-10-CM | POA: Diagnosis present

## 2014-05-13 DIAGNOSIS — K649 Unspecified hemorrhoids: Secondary | ICD-10-CM

## 2014-05-13 DIAGNOSIS — K922 Gastrointestinal hemorrhage, unspecified: Secondary | ICD-10-CM | POA: Diagnosis present

## 2014-05-13 LAB — CBC WITH DIFFERENTIAL/PLATELET
BASOS ABS: 0 10*3/uL (ref 0.0–0.1)
Basophils Relative: 0 % (ref 0–1)
Eosinophils Absolute: 0 10*3/uL (ref 0.0–0.7)
Eosinophils Relative: 0 % (ref 0–5)
HCT: 28.1 % — ABNORMAL LOW (ref 36.0–46.0)
Hemoglobin: 9 g/dL — ABNORMAL LOW (ref 12.0–15.0)
Lymphocytes Relative: 11 % — ABNORMAL LOW (ref 12–46)
Lymphs Abs: 0.6 10*3/uL — ABNORMAL LOW (ref 0.7–4.0)
MCH: 33.1 pg (ref 26.0–34.0)
MCHC: 32 g/dL (ref 30.0–36.0)
MCV: 103.3 fL — AB (ref 78.0–100.0)
MONO ABS: 0.3 10*3/uL (ref 0.1–1.0)
Monocytes Relative: 6 % (ref 3–12)
NEUTROS PCT: 83 % — AB (ref 43–77)
Neutro Abs: 4.2 10*3/uL (ref 1.7–7.7)
Platelets: 194 10*3/uL (ref 150–400)
RBC: 2.72 MIL/uL — ABNORMAL LOW (ref 3.87–5.11)
RDW: 22 % — ABNORMAL HIGH (ref 11.5–15.5)
WBC: 5.1 10*3/uL (ref 4.0–10.5)

## 2014-05-13 LAB — COMPREHENSIVE METABOLIC PANEL
ALBUMIN: 3.7 g/dL (ref 3.5–5.2)
ALK PHOS: 48 U/L (ref 39–117)
ALT: 32 U/L (ref 0–35)
AST: 44 U/L — AB (ref 0–37)
Anion gap: 12 (ref 5–15)
BUN: 17 mg/dL (ref 6–23)
CO2: 26 mmol/L (ref 19–32)
CREATININE: 0.74 mg/dL (ref 0.50–1.10)
Calcium: 9.5 mg/dL (ref 8.4–10.5)
Chloride: 90 mmol/L — ABNORMAL LOW (ref 96–112)
GFR calc non Af Amer: 75 mL/min — ABNORMAL LOW (ref >90)
GFR, EST AFRICAN AMERICAN: 87 mL/min — AB (ref >90)
Glucose, Bld: 105 mg/dL — ABNORMAL HIGH (ref 70–99)
Potassium: 4.6 mmol/L (ref 3.5–5.1)
Sodium: 128 mmol/L — ABNORMAL LOW (ref 135–145)
TOTAL PROTEIN: 6.2 g/dL (ref 6.0–8.3)
Total Bilirubin: 0.8 mg/dL (ref 0.3–1.2)

## 2014-05-13 LAB — URINALYSIS, ROUTINE W REFLEX MICROSCOPIC
BILIRUBIN URINE: NEGATIVE
Glucose, UA: NEGATIVE mg/dL
Hgb urine dipstick: NEGATIVE
Ketones, ur: NEGATIVE mg/dL
Leukocytes, UA: NEGATIVE
NITRITE: NEGATIVE
PH: 7.5 (ref 5.0–8.0)
Protein, ur: NEGATIVE mg/dL
SPECIFIC GRAVITY, URINE: 1.005 (ref 1.005–1.030)
Urobilinogen, UA: 0.2 mg/dL (ref 0.0–1.0)

## 2014-05-13 LAB — PROTIME-INR
INR: 1.18 (ref 0.00–1.49)
Prothrombin Time: 15.1 seconds (ref 11.6–15.2)

## 2014-05-13 LAB — PREPARE RBC (CROSSMATCH)

## 2014-05-13 LAB — TROPONIN I: Troponin I: 0.04 ng/mL — ABNORMAL HIGH (ref <0.031)

## 2014-05-13 LAB — POC OCCULT BLOOD, ED: Fecal Occult Bld: POSITIVE — AB

## 2014-05-13 LAB — LACTIC ACID, PLASMA: LACTIC ACID, VENOUS: 1.4 mmol/L (ref 0.5–2.0)

## 2014-05-13 MED ORDER — DIGOXIN 125 MCG PO TABS
125.0000 ug | ORAL_TABLET | Freq: Every day | ORAL | Status: DC
  Administered 2014-05-14 – 2014-05-15 (×2): 125 ug via ORAL
  Filled 2014-05-13 (×2): qty 1

## 2014-05-13 MED ORDER — ZOLPIDEM TARTRATE 5 MG PO TABS
5.0000 mg | ORAL_TABLET | Freq: Every evening | ORAL | Status: DC | PRN
Start: 2014-05-13 — End: 2014-05-15

## 2014-05-13 MED ORDER — METOPROLOL TARTRATE 25 MG PO TABS
12.5000 mg | ORAL_TABLET | Freq: Two times a day (BID) | ORAL | Status: DC
  Administered 2014-05-13 – 2014-05-15 (×4): 12.5 mg via ORAL
  Filled 2014-05-13 (×4): qty 1

## 2014-05-13 MED ORDER — SODIUM CHLORIDE 0.9 % IJ SOLN: 3.0000 mL | Freq: Two times a day (BID) | INTRAMUSCULAR | Status: DC

## 2014-05-13 MED ORDER — ACETAMINOPHEN 325 MG PO TABS
650.0000 mg | ORAL_TABLET | Freq: Four times a day (QID) | ORAL | Status: DC | PRN
  Administered 2014-05-13 – 2014-05-14 (×2): 650 mg via ORAL
  Filled 2014-05-13 (×2): qty 2

## 2014-05-13 MED ORDER — ALPRAZOLAM 0.25 MG PO TABS
0.2500 mg | ORAL_TABLET | Freq: Once | ORAL | Status: AC
  Administered 2014-05-13: 0.25 mg via ORAL
  Filled 2014-05-13: qty 1

## 2014-05-13 MED ORDER — DICYCLOMINE HCL 10 MG PO CAPS
10.0000 mg | ORAL_CAPSULE | Freq: Every day | ORAL | Status: DC | PRN
  Administered 2014-05-13 – 2014-05-14 (×2): 10 mg via ORAL
  Filled 2014-05-13 (×2): qty 1

## 2014-05-13 MED ORDER — ALLOPURINOL 300 MG PO TABS
300.0000 mg | ORAL_TABLET | Freq: Every day | ORAL | Status: DC
Start: 2014-05-14 — End: 2014-05-15
  Administered 2014-05-14 – 2014-05-15 (×2): 300 mg via ORAL
  Filled 2014-05-13 (×2): qty 1

## 2014-05-13 MED ORDER — SODIUM CHLORIDE 0.9 % IV SOLN
INTRAVENOUS | Status: DC
  Administered 2014-05-13: 17:00:00 via INTRAVENOUS

## 2014-05-13 MED ORDER — LEVOTHYROXINE SODIUM 88 MCG PO TABS
88.0000 ug | ORAL_TABLET | Freq: Every day | ORAL | Status: DC
  Administered 2014-05-14 – 2014-05-15 (×2): 88 ug via ORAL
  Filled 2014-05-13 (×2): qty 1

## 2014-05-13 MED ORDER — HEPARIN (PORCINE) IN NACL 100-0.45 UNIT/ML-% IJ SOLN
800.0000 [IU]/h | INTRAMUSCULAR | Status: DC
  Administered 2014-05-13: 800 [IU]/h via INTRAVENOUS
  Filled 2014-05-13: qty 250

## 2014-05-13 MED ORDER — SIMVASTATIN 10 MG PO TABS
5.0000 mg | ORAL_TABLET | Freq: Every day | ORAL | Status: DC
  Administered 2014-05-13: 5 mg via ORAL
  Filled 2014-05-13: qty 1

## 2014-05-13 MED ORDER — SODIUM CHLORIDE 0.9 % IV SOLN
Freq: Once | INTRAVENOUS | Status: AC
Start: 2014-05-13 — End: 2014-05-13
  Administered 2014-05-13: 16:00:00 via INTRAVENOUS

## 2014-05-13 NOTE — ED Notes (Addendum)
Pt c/o generalized weakness due to low hgb, 7.6 per labs yesterday along with low coumadin at 1.5. Pt received blood on Monday.

## 2014-05-13 NOTE — Progress Notes (Signed)
Deborah [name]   DOB:11/27/2002   SN#:[id number]   LPF#:[id number]  Subjective:  "Deborah [name]" just returned from a visit to [place] where her diet doubtless changed. That may explain the increase in her INR. At any rate she was having more bleeding than usual and was admitted for further transfusion, despite receiving 2 units PRBCs on 05/11/2014. This evening she is comfortable, eating supper, denies CP/P, dizzyness, or worsening SOB at rest. Daughter in room  Objective: 81 White woman exmained in bed Filed Vitals:   05/13/14 1554  BP: 142/54  Pulse: 115  Temp: 98.5 F (36.9 C)  Resp: 17    Body mass index is 24.63 kg/(m^2). No intake or output data in the 24 hours ending 05/13/14 1735   Sclerae unicteric  Oropharynx clear  Lungs clear -- no rales or rhonchi  Heart regular rate and rhythm, murmur as previously noted  Abdomen soft, +BS, NT  MSK no focal spinal tenderness, no peripheral edema  Neuro nonfocal   CBG (last 3)  No results for input(s): GLUCAP in the last 72 hours.   Labs:  Lab Results  Component Value Date   WBC 5.1 05/13/2014   HGB 9.0* 05/13/2014   HCT 28.1* 05/13/2014   MCV 103.3* 05/13/2014   PLT 194 05/13/2014   NEUTROABS 4.2 05/13/2014    @LASTCHEMISTRY @  Urine Studies No results for input(s): UHGB, CRYS in the last 72 hours.  Invalid input(s): UACOL, UAPR, USPG, UPH, UTP, UGL, UKET, UBIL, UNIT, UROB, ULEU, UEPI, UWBC, URBC, UBAC, CAST, UCOM, BILUA  Basic Metabolic Panel:  Recent Labs Lab 05/12/14 0936 05/13/14 1223  NA 132* 128*  K 4.2 4.6  CL 98 90*  CO2 Deborah 26  GLUCOSE 127* 105*  BUN 20 17  CREATININE 0.69 0.74  CALCIUM 9.4 9.5   GFR Estimated Creatinine Clearance: 40.7 mL/min (by C-G formula based on Cr of 0.74). Liver Function Tests:  Recent Labs Lab 05/12/14 0936 05/13/14 1223  AST 22 44*  ALT 22 32  ALKPHOS 44 48  BILITOT 0.4 0.8  PROT 5.2* 6.2  ALBUMIN 3.4* 3.7   No results for input(s): LIPASE, AMYLASE in the last 168  hours. No results for input(s): AMMONIA in the last 168 hours. Coagulation profile  Recent Labs Lab 05/09/14 1054 05/09/14 1121 05/09/14 1400 05/12/14 0936 05/13/14 1223  INR Sent out for confirmation 5.06* 5.06 1.5* 1.18  PROTIME Sent out for confirmation.  --   --   --   --     CBC:  Recent Labs Lab 05/09/14 1054 05/12/14 0936 05/13/14 1223  WBC 6.2 6.2 5.1  NEUTROABS 5.0 5.2 4.2  HGB 6.1* 7.9 Repeated and verified X2.* 9.0*  HCT 19.5* 23.6 Repeated and verified X2.* 28.1*  MCV 104.3* 98.0 103.3*  PLT 156 183.0 194   Cardiac Enzymes:  Recent Labs Lab 05/13/14 1223  TROPONINI 0.04*   BNP: Invalid input(s): POCBNP CBG: No results for input(s): GLUCAP in the last 168 hours. D-Dimer No results for input(s): DDIMER in the last 72 hours. Hgb A1c No results for input(s): HGBA1C in the last 72 hours. Lipid Profile No results for input(s): CHOL, HDL, LDLCALC, TRIG, CHOLHDL, LDLDIRECT in the last 72 hours. Thyroid function studies No results for input(s): TSH, T4TOTAL, T3FREE, THYROIDAB in the last 72 hours.  Invalid input(s): FREET3 Anemia work up No results for input(s): VITAMINB12, FOLATE, FERRITIN, TIBC, IRON, RETICCTPCT in the last 72 hours. Microbiology No results found for this or any previous visit (from the past  240 hour(s)).    Studies:  No results found.  Assessment: 81 y.o. [name] with iron deficiency anemia secondary to chronic blood loss, s/p oral iron supplementation with poor tolerance and inadequate response  (1) Feraheme started 07/24/2012, repeated whenever ferritin drops <100, mosr recent dose 06/30/2013  (2) transfusing PBBCs for Hb < 9.0 or symptoms  (3) chronic GIB extensively evaluated by GI with findings including AVM (cauterized), diverticular disease, hemorrhoids and gastric polyps  (4) on lifelong coumadin for AFib and mechanical aortic valve  Plan:   Deborah [name]'s situation is frustrating, as all chronic problems are. From a  hematology point of view, she gets labs every 2 weeks and gets iron infusions whenever the ferritin drops below 100, PRBCs whenever the Hb drops below 9.0 or for symptoms. We may need to add epo and she will benefit from port placement (which we will arrange for as outpatient unless she is expected to be in the hospital into Monday).  I expect she will be discharged this weekend, perhaps tomorrow. She has follow-up in our clinic Tuesday.  Please let us know if we can assist further at this point, and thank you for your help to this patient!   [name], MD 05/13/2014  5:35 PM Medical Oncology and Hematology [facility] [address] [place], [place] [address] Tel. [phone]    Fax. [phone]

## 2014-05-13 NOTE — H&P (Signed)
History and Physical    Deborah [name] ZTI:[id number] DOB: 23-Feb-1927 DOA: 05/13/2014  Referring physician: Dr. Aline [name] PCP: Deborah Coco, MD  Specialists: GI  Chief Complaint: weakness  HPI: Deborah [name] is a 81 y.o. female has a past medical history significant for atrial fibrillation, mechanical aortic valve, recurrent GI bleeds, hyperlipidemia, hypertension, presents to the emergency room with a chief complaint of weakness. The patient has been on Coumadin since her valve replacement in 1996, and she has been having in the last few years several episodes of melena concerning for chronic low-grade GI bleeding, she was extensively evaluated by gastroenterology, she underwent capsule study last year, and other than a few colonic AVMs there was no major source of bleeding identified. She has recently traveled to [place] where she spent a week, and when she got back she had an INR checked and was found to be elevated at 5. Around the same time patient has noticed that she's been having dark tarry stools. She denies any diarrhea or frank bleeding, she has one formed bowel movement per day which is black. She is followed by Dr. Jana [name] from oncology for her chronic anemia and chronic iron deficiency. She was seen in cancer center on Monday 2/8 and at that time her hemoglobin was 6.1. She had 2 units of blood and her hemoglobin improved to 7.9 on 2/11. She continues to feel weak and with lack of energy, she discussed with her cardiologist Dr. Mare [name] and her oncologist, and she decided to come to the emergency room. A repeat hemoglobin in the ED is 9.0. She continues to be symptomatic. She denies any chest pain, breathing difficulties, she denies any dizziness, she denies any stomach pain, nausea vomiting or diarrhea. She denies any fevers. Her INR in the emergency room was 1.18   Review of Systems: As per history of present illness, otherwise 10 point review of system negative   Past  Medical History  Diagnosis Date  . Hypothyroidism   . Stomach problems     seeing Dr Deborah [name]  . Hypertension   . GERD (gastroesophageal reflux disease)   . Anemia   . Arrhythmia     Atrial Fibrillation  . Breast cancer     "right; S/P lumpectomy, chemo, XRT"   . Complication of anesthesia     "once I'm awakened, I don't sleep til the following night"  . High cholesterol   . Heart murmur   . History of blood transfusion 1996; ~ 2013    "related to valve replacement; GI bleeding"   . Chronic lower GI bleeding   . Arthritis     "fingers, toes, hips; qwhere" (05/19/2013)  . Gout   . Pneumonia 07/2011   Past Surgical History  Procedure Laterality Date  . Arteriogram  01/12    NO RAS   . Cardioversion      X 2  . Cardiac catheterization    . Cataract extraction w/ intraocular lens  implant, bilateral Bilateral     bilateral cataract with lens implants  . Cholecystectomy    . Abdominal hysterectomy    . Appendectomy    . Colonoscopy with propofol N/A 11/24/2012    Procedure: COLONOSCOPY WITH PROPOFOL;  Surgeon: Jeryl Columbia, MD;  Location: [facility];  Service: Endoscopy;  Laterality: N/A;  . Breast biopsy Right   . Breast lumpectomy Right   . Aortic valve replacement  1996    St. Jude  . Cardiac valve replacement  1996  Social History:  reports that she quit smoking about 26 years ago. Her smoking use included Cigarettes. She has a 20 pack-year smoking history. She has never used smokeless tobacco. She reports that she drinks about 4.8 oz of alcohol per week. She reports that she does not use illicit drugs.  Allergies  Allergen Reactions  . Amiodarone Shortness Of Breath  . Alendronate Sodium     GI upset  . Macrodantin [Nitrofurantoin]     GI upset  . Meclizine Swelling    tongue  . Norvasc [Amlodipine Besylate]     edema  . Tussionex Pennkinetic Er [Hydrocod Polst-Cpm Polst Er] Other (See Comments)    Reaction unknown    Family History  Problem Relation Age of  Onset  . Dementia Mother   . Coronary artery disease Father     Prior to Admission medications   Medication Sig Start Date End Date Taking? Authorizing Provider  acetaminophen (TYLENOL) 500 MG tablet Take 1,000 mg by mouth every 6 (six) hours as needed for headache.   Yes Historical Provider, MD  allopurinol (ZYLOPRIM) 300 MG tablet TAKE ONE TABLET EVERY DAY 05/09/14  Yes Deborah Coco, MD  calcium-vitamin D 500 MG tablet Take 1 tablet by mouth 2 (two) times daily.     Yes Historical Provider, MD  COUMADIN 5 MG tablet TAKE ONE TABLET EACH DAY OR TAKE AS DIRECTED BY COUMADIN CLINIC Patient taking differently: TAKE half of a tablet daily as directed by coumadin clinic. 10/20/13  Yes [name], MD  dicyclomine (BENTYL) 10 MG capsule Take 10 mg by mouth daily as needed for spasms.    Yes Historical Provider, MD  fexofenadine (ALLEGRA ALLERGY) 180 MG tablet Take 180 mg by mouth daily as needed for allergies.    Yes Historical Provider, MD  furosemide (LASIX) 40 MG tablet TAKE ONE TABLET EACH DAY 11/16/13  Yes Deborah Coco, MD  LANOXIN 250 MCG tablet TAKE 1/2 TABLET DAILY 05/09/14  Yes Deborah Coco, MD  metoprolol tartrate (LOPRESSOR) 25 MG tablet TAKE ONE-HALF TABLET IN THE MORNING AND TAKE ONE TABLET IN THE EVENING 05/09/14  Yes Deborah Coco, MD  multivitamin [facility]) per tablet Take 1 tablet by mouth daily.    Yes Historical Provider, MD  NITROSTAT 0.4 MG SL tablet ONE TABLET UNDER TONGUE AS NEEDED 05/09/14  Yes Deborah Coco, MD  Omega-3 Fatty Acids (FISH OIL) 1000 MG CAPS Take 1,000 mg by mouth 2 (two) times daily.    Yes Historical Provider, MD  potassium chloride SA (K-DUR,KLOR-CON) 20 MEQ tablet TAKE ONE TABLET EVERY DAY 02/08/14  Yes Deborah Coco, MD  simvastatin (ZOCOR) 20 MG tablet Take 10 mg by mouth daily.   Yes Historical Provider, MD  SYNTHROID 88 MCG tablet TAKE ONE TABLET EVERY DAY 05/09/14  Yes Deborah Coco, MD  TRIBENZOR 40-5-12.5 MG TABS TAKE ONE TABLET  EVERY DAY 02/08/14  Yes Deborah Coco, MD  zolpidem (AMBIEN CR) 12.5 MG CR tablet TAKE ONE TABLET AT BEDTIME AS NEEDED Patient taking differently: Take one tablet at bedtime as needed for sleep. 02/08/14  Yes Deborah Coco, MD  CELEBREX 200 MG capsule [place] Patient not taking: Reported on 05/13/2014 01/11/14   Deborah Coco, MD  celecoxib (CELEBREX) 200 MG capsule Take 200 mg by mouth daily.    Historical Provider, MD  simvastatin (ZOCOR) 20 MG tablet TAKE ONE-HALF TABLET DAILY Patient not taking: Reported on 05/13/2014    Deborah Coco, MD   Physical Exam: Filed Vitals:  05/13/14 1107 05/13/14 1524  BP: 143/57 93/54  Pulse: 64 71  Temp: 97.6 F (36.4 C)   TempSrc: Oral   Resp: 16 20  SpO2: 99% 97%     General:  No apparent distress  Eyes: no scleral icterus  ENT: moist oropharynx  Neck: supple, no lymphadenopathy  Cardiovascular: iregular, mechanical click; 2+ peripheral pulses, no JVD, no peripheral edema  Respiratory: CTA biL, good air movement without wheezing, rhonchi or crackled  Abdomen: soft, non tender to palpation, positive bowel sounds, no guarding, no rebound  Skin: no rashes  Musculoskeletal: normal bulk and tone, no joint swelling  Psychiatric: normal mood and affect  Neurologic: non focal  Labs on Admission:  Basic Metabolic Panel:  Recent Labs Lab 05/12/14 0936 05/13/14 1223  NA 132* 128*  K 4.2 4.6  CL 98 90*  CO2 30 26  GLUCOSE 127* 105*  BUN 20 17  CREATININE 0.69 0.74  CALCIUM 9.4 9.5   Liver Function Tests:  Recent Labs Lab 05/12/14 0936 05/13/14 1223  AST 22 44*  ALT 22 32  ALKPHOS 44 48  BILITOT 0.4 0.8  PROT 5.2* 6.2  ALBUMIN 3.4* 3.7   CBC:  Recent Labs Lab 05/09/14 1054 05/12/14 0936 05/13/14 1223  WBC 6.2 6.2 5.1  NEUTROABS 5.0 5.2 4.2  HGB 6.1* 7.9 Repeated and verified X2.* 9.0*  HCT 19.5* 23.6 Repeated and verified X2.* 28.1*  MCV 104.3* 98.0 103.3*  PLT 156 183.0 194    Cardiac Enzymes:  Recent Labs Lab 05/13/14 1223  TROPONINI 0.04*   Radiological Exams on Admission: No results found.  EKG: Independently reviewed. A fib  Assessment/Plan Active Problems:   Hypothyroidism   S/P aortic valve replacement with metallic valve   Hyponatremia   Long term current use of anticoagulant therapy   Atrial fibrillation   Malaise and fatigue   Iron deficiency anemia due to chronic blood loss   Melena   GI bleed   GI bleed - clinically this looks like a chronic slow GI bleed. She recently had a very supratherapeutic INR around 5, and around that time she has noticed black stools. Her INR has calmed down as she withheld her Coumadin for a few days, and restarted last night. Her hemoglobin was significantly decreased, however responded very appropriately to blood transfusions, and has remained stable since Monday. I have consulted gastroenterology to obtain their input, appreciate help. Per her primary oncologist, will transfuse 1 unit of packed red blood cell to keep her hemoglobin 10 or above.  Mechanical aortic valve requiring long-term anticoagulation - patient's INR is low of 1.18. I have extensively discussed with the patient and the patient's family regarding the risks and benefits at this point in time given the fact that she just had a GI bleed requiring blood transfusions and her INR currently is subtherapeutic and she is at higher risk for stroke. After discussing benefits/alternatives, and since the GI bleed seems to have stopped, will start heparin infusion without bolus. I'm going to hold the Coumadin tonight and monitor CBCs this evening and tomorrow morning. I have discussed with her primary cardiologist Dr. Mare [name] as well.   Atrial fibrillation - will be on heparin, rate controlled with metoprolol  Hypothyroidism - resume home medications   Diet: clear Fluids: NS DVT Prophylaxis: heparin infusion  Code Status: DNR  Family Communication:  d/w daughter bedside  Disposition Plan: admit to telemetry   Time spent: 34  Costin M. Cruzita Lederer, MD [facility] Pager [phone]  If 7PM-7AM, please contact night-coverage [email or web address] Password [facility] 05/13/2014, 3:36 PM

## 2014-05-13 NOTE — Progress Notes (Signed)
Last night, patient's heart rate dropped into the 30's and 40's non sustaining while patient was sleeping.

## 2014-05-13 NOTE — Progress Notes (Signed)
ANTICOAGULATION CONSULT NOTE - Initial Consult  Pharmacy Consult for heparin Indication: mechanical AVR  Allergies  Allergen Reactions  . Amiodarone Shortness Of Breath  . Alendronate Sodium     GI upset  . Macrodantin [Nitrofurantoin]     GI upset  . Meclizine Swelling    tongue  . Norvasc [Amlodipine Besylate]     edema  . Tussionex Pennkinetic Er [Hydrocod Polst-Cpm Polst Er] Other (See Comments)    Reaction unknown    Patient Measurements: Weight: 57.2kg Heparin Dosing Weight: 57kg  Vital Signs: Temp: 97.6 F (36.4 C) (02/12 1107) Temp Source: Oral (02/12 1107) BP: 93/54 mmHg (02/12 1524) Pulse Rate: 71 (02/12 1524)  Labs:  Recent Labs  05/12/14 0936 05/13/14 1223  HGB 7.9 Repeated and verified X2.* 9.0*  HCT 23.6 Repeated and verified X2.* 28.1*  PLT 183.0 194  LABPROT 16.8* 15.1  INR 1.5* 1.18  CREATININE 0.69 0.74  TROPONINI  --  0.04*    Estimated Creatinine Clearance: 40.7 mL/min (by C-G formula based on Cr of 0.74).   Medical History: Past Medical History  Diagnosis Date  . Hypothyroidism   . Stomach problems     seeing Dr Watt Climes  . Hypertension   . GERD (gastroesophageal reflux disease)   . Anemia   . Arrhythmia     Atrial Fibrillation  . Breast cancer     "right; S/P lumpectomy, chemo, XRT"   . Complication of anesthesia     "once I'm awakened, I don't sleep til the following night"  . High cholesterol   . Heart murmur   . History of blood transfusion 1996; ~ 2013    "related to valve replacement; GI bleeding"   . Chronic lower GI bleeding   . Arthritis     "fingers, toes, hips; qwhere" (05/19/2013)  . Gout   . Pneumonia 07/2011    Assessment: 89 yoF admitted 2/12 with persistent weakness. Pt on chronic warfarin therapy PTA for mechanical AVR and atrial fibrillation managed at [facility]. Pt noted to have chronic lower GIB and notes persistently dark stools over the last 10 days. Hgb down to 6.1 on 2/8 with INR elevated to  5.06. Pt received 2U PRBCs on 2/8, was instructed to hold her warfarin x 3 days and restart with a lower daily dose until recheck scheduled for 2/16. Pharmacy consulted to start heparin for bridge while INR below goal in a patient with a mechanical aortic valve, MD requested no bolus.   Usual home dose 5mg  daily except 2.5mg  on Tues and Fridays  Patient instructed to hold warfarin Mon, Tues, and Wed this week (2/8-2/10) and then start taking 2.5mg  daily on 2/11 until recheck  Hgb up to 9.0 on admission, platelets WNL  INR SUB therapeutic at 1.18 on admit as expected after holding warfarin  CrCl 40 ml/min   Goal of Therapy:  INR 2.5-3.0 Monitor platelets by anticoagulation protocol: Yes   Plan:  - start heparin gtt at 800 units/hr (14 units/kg/hr)  - no bolus given per MD request - 8 hour heparin level - Daily heparin level and CBC - follow up plans for warfarin restart - pharmacy will follow up daily  Thank you for the consult.  Currie Paris, PharmD, BCPS Pager: [phone] Pharmacy: [phone] 05/13/2014 3:44 PM

## 2014-05-13 NOTE — Consult Note (Signed)
Subjective:   HPI  The patient is an 81 year old female with a history of a mechanical aortic valve and atrial fibrillation. She has been on chronic Coumadin therapy. She has problems with recurring anemia and this has been a problem for years. She notices intermittent dark-colored stools. She was found to have a very low hemoglobin and is being admitted to the hospital for blood transfusion. In 2013 she had an EGD which showed hiatal hernia and fundic gland polyps. In 2014 she had a colonoscopy which showed hemorrhoids, a few diverticula, and an AVM in the descending colon which was cauterized with the argon plasma coagulator. In March 2015 she had a capsule endoscopy and nothing was found on that exam to explain chronic blood loss.  Review of Systems Denies chest pain or shortness of breath  Past Medical History  Diagnosis Date  . Hypothyroidism   . Stomach problems     seeing Dr Watt Climes  . Hypertension   . GERD (gastroesophageal reflux disease)   . Anemia   . Arrhythmia     Atrial Fibrillation  . Breast cancer     "right; S/P lumpectomy, chemo, XRT"   . Complication of anesthesia     "once I'm awakened, I don't sleep til the following night"  . High cholesterol   . Heart murmur   . History of blood transfusion 1996; ~ 2013    "related to valve replacement; GI bleeding"   . Chronic lower GI bleeding   . Arthritis     "fingers, toes, hips; qwhere" (05/19/2013)  . Gout   . Pneumonia 07/2011   Past Surgical History  Procedure Laterality Date  . Arteriogram  01/12    NO RAS   . Cardioversion      X 2  . Cardiac catheterization    . Cataract extraction w/ intraocular lens  implant, bilateral Bilateral     bilateral cataract with lens implants  . Cholecystectomy    . Abdominal hysterectomy    . Appendectomy    . Colonoscopy with propofol N/A 11/24/2012    Procedure: COLONOSCOPY WITH PROPOFOL;  Surgeon: Jeryl Columbia, MD;  Location: [facility];  Service: Endoscopy;  Laterality:  N/A;  . Breast biopsy Right   . Breast lumpectomy Right   . Aortic valve replacement  1996    St. Jude  . Cardiac valve replacement  1996   History   Social History  . Marital Status: Widowed    Spouse Name: N/A  . Number of Children: N/A  . Years of Education: N/A   Occupational History  . Not on file.   Social History Main Topics  . Smoking status: Former Smoker -- 1.00 packs/day for 20 years    Types: Cigarettes    Quit date: 04/01/1988  . Smokeless tobacco: Never Used  . Alcohol Use: 4.8 oz/week    4 Glasses of wine, 4 Shots of liquor per week     Comment: burbon or glass wine daily  . Drug Use: No  . Sexual Activity: No   Other Topics Concern  . Not on file   Social History Narrative   family history includes Coronary artery disease in her father; Dementia in her mother.  Current facility-administered medications:  .  0.9 %  sodium chloride infusion, , Intravenous, Once, Pamella Pert, MD .  acetaminophen (TYLENOL) tablet 650 mg, 650 mg, Oral, Q6H PRN, Caren Griffins, MD .  Derrill Memo ON 05/14/2014] allopurinol (ZYLOPRIM) tablet 300 mg, 300 mg, Oral, Daily,  Costin Karlyne Greenspan, MD .  dicyclomine (BENTYL) capsule 10 mg, 10 mg, Oral, Daily PRN, Caren Griffins, MD .  digoxin (LANOXIN) tablet 125 mcg, 125 mcg, Oral, Daily, Costin Karlyne Greenspan, MD .  heparin ADULT infusion 100 units/mL (25000 units/250 mL), 800 Units/hr, Intravenous, Continuous, Mosetta Pigeon, RPH .  [START ON 05/14/2014] levothyroxine (SYNTHROID, LEVOTHROID) tablet 88 mcg, 88 mcg, Oral, QAC breakfast, Costin Karlyne Greenspan, MD .  simvastatin (ZOCOR) tablet 5 mg, 5 mg, Oral, q1800, Costin Karlyne Greenspan, MD .  sodium chloride 0.9 % injection 3 mL, 3 mL, Intravenous, Q12H, Costin Karlyne Greenspan, MD, 3 mL at 05/13/14 1530 .  zolpidem (AMBIEN) tablet 5 mg, 5 mg, Oral, QHS PRN, Caren Griffins, MD Allergies  Allergen Reactions  . Amiodarone Shortness Of Breath  . Alendronate Sodium     GI upset  . Macrodantin  [Nitrofurantoin]     GI upset  . Meclizine Swelling    tongue  . Norvasc [Amlodipine Besylate]     edema  . Tussionex Pennkinetic Er [Hydrocod Polst-Cpm Polst Er] Other (See Comments)    Reaction unknown     Objective:     BP 142/54 mmHg  Pulse 115  Temp(Src) 98.5 F (36.9 C) (Oral)  Resp 17  SpO2 90%  She is in no distress  Appears pale  Heart irregular rhythm  Lungs clear  Abdomen: Soft and nontender  Laboratory No components found for: D1    Assessment:     Chronic GI blood loss in the face of chronic Coumadin therapy. She has been extensively evaluated in the past. It is most likely that she has bleeding from gastrointestinal AVMs.      Plan:     She appears to be stable at this time. She will receive blood transfusion. Follow clinically. Further decisions to be made as to whether or not she needs further workup from the GI tract. Lab Results  Component Value Date   HGB 9.0* 05/13/2014   HGB 7.9 Repeated and verified X2.* 05/12/2014   HGB 6.1* 05/09/2014   HGB 10.8* 04/19/2014   HGB 12.2 04/05/2014   HGB 9.3* 05/22/2013   HCT 28.1* 05/13/2014   HCT 23.6 Repeated and verified X2.* 05/12/2014   HCT 19.5* 05/09/2014   HCT 34.4* 04/19/2014   HCT 37.5 04/05/2014   HCT 28.4* 05/22/2013   ALKPHOS 48 05/13/2014   ALKPHOS 44 05/12/2014   ALKPHOS 64 04/19/2014   ALKPHOS 57 05/19/2013   ALKPHOS 56 01/20/2013   AST 44* 05/13/2014   AST 22 05/12/2014   AST 19 04/19/2014   AST 34 05/19/2013   AST 17 01/20/2013   ALT 32 05/13/2014   ALT 22 05/12/2014   ALT 20 04/19/2014   ALT 26 05/19/2013   ALT 20 01/20/2013

## 2014-05-13 NOTE — ED Notes (Signed)
Bed: EZ74 Expected date:  Expected time:  Means of arrival:  Comments: EMS-low HgB

## 2014-05-13 NOTE — ED Provider Notes (Signed)
CSN: [id number]     Arrival date & time 05/13/14  1057 History   First MD Initiated Contact with Patient 05/13/14 1101     Chief Complaint  Patient presents with  . Weakness     (Consider location/radiation/quality/duration/timing/severity/associated sxs/prior Treatment) Patient is a 81 y.o. female presenting with weakness. The history is provided by the patient.  Weakness This is a new problem. The current episode started more than 1 week ago. The problem occurs constantly. The problem has been gradually worsening. Pertinent negatives include no chest pain, no abdominal pain, no headaches and no shortness of breath. Nothing aggravates the symptoms. Nothing relieves the symptoms. She has tried nothing for the symptoms. The treatment provided no relief.    Past Medical History  Diagnosis Date  . Hypothyroidism   . Stomach problems     seeing Dr Watt Climes  . Hypertension   . GERD (gastroesophageal reflux disease)   . Anemia   . Arrhythmia     Atrial Fibrillation  . Breast cancer     "right; S/P lumpectomy, chemo, XRT"   . Complication of anesthesia     "once I'm awakened, I don't sleep til the following night"  . High cholesterol   . Heart murmur   . History of blood transfusion 1996; ~ 2013    "related to valve replacement; GI bleeding"   . Chronic lower GI bleeding   . Arthritis     "fingers, toes, hips; qwhere" (05/19/2013)  . Gout   . Pneumonia 07/2011   Past Surgical History  Procedure Laterality Date  . Arteriogram  01/12    NO RAS   . Cardioversion      X 2  . Cardiac catheterization    . Cataract extraction w/ intraocular lens  implant, bilateral Bilateral     bilateral cataract with lens implants  . Cholecystectomy    . Abdominal hysterectomy    . Appendectomy    . Colonoscopy with propofol N/A 11/24/2012    Procedure: COLONOSCOPY WITH PROPOFOL;  Surgeon: Jeryl Columbia, MD;  Location: [facility];  Service: Endoscopy;  Laterality: N/A;  . Breast biopsy Right   .  Breast lumpectomy Right   . Aortic valve replacement  1996    [employer]  . Cardiac valve replacement  1996   Family History  Problem Relation Age of Onset  . Dementia Mother   . Coronary artery disease Father    History  Substance Use Topics  . Smoking status: Former Smoker -- 1.00 packs/day for 20 years    Types: Cigarettes    Quit date: 04/01/1988  . Smokeless tobacco: Never Used  . Alcohol Use: 4.8 oz/week    4 Glasses of wine, 4 Shots of liquor per week     Comment: burbon or glass wine daily   OB History    No data available     Review of Systems  Constitutional: Positive for fatigue. Negative for fever.  HENT: Negative for congestion and drooling.   Eyes: Negative for pain.  Respiratory: Negative for cough and shortness of breath.   Cardiovascular: Negative for chest pain.  Gastrointestinal: Negative for nausea, vomiting, abdominal pain and diarrhea.  Genitourinary: Negative for dysuria and hematuria.  Musculoskeletal: Negative for back pain, gait problem and neck pain.  Skin: Negative for color change.  Neurological: Positive for weakness (generalized). Negative for dizziness and headaches.  Hematological: Negative for adenopathy.  Psychiatric/Behavioral: Negative for behavioral problems.  All other systems reviewed and are negative.  Allergies  Amiodarone; Alendronate sodium; Macrodantin; Meclizine; Norvasc; and Tussionex pennkinetic er  Home Medications   Prior to Admission medications   Medication Sig Start Date End Date Taking? Authorizing Provider  allopurinol (ZYLOPRIM) 300 MG tablet TAKE ONE TABLET EVERY DAY 05/09/14   Darlin Coco, MD  calcium-vitamin D 500 MG tablet Take 1 tablet by mouth 2 (two) times daily.      Historical Provider, MD  CELEBREX 200 MG capsule TAKE ONE CAPSULE EACH DAY 01/11/14   Darlin Coco, MD  celecoxib (CELEBREX) 200 MG capsule Take 200 mg by mouth daily.    Historical Provider, MD  COUMADIN 5 MG tablet TAKE ONE  TABLET EACH DAY OR TAKE AS DIRECTED BY COUMADIN CLINIC 10/20/13   [name], MD  dicyclomine (BENTYL) 10 MG capsule Take 10 mg by mouth as needed. For stomach cramps    Historical Provider, MD  fexofenadine (ALLEGRA ALLERGY) 180 MG tablet Take 180 mg by mouth daily. For allergies. As needed    Historical Provider, MD  furosemide (LASIX) 40 MG tablet TAKE ONE TABLET EACH DAY 11/16/13   Darlin Coco, MD  LANOXIN 250 MCG tablet TAKE 1/2 TABLET DAILY 05/09/14   Darlin Coco, MD  lansoprazole (PREVACID) 30 MG capsule Take 30 mg by mouth daily.      Historical Provider, MD  metoprolol tartrate (LOPRESSOR) 25 MG tablet TAKE ONE-HALF TABLET IN THE MORNING AND TAKE ONE TABLET IN THE EVENING 05/09/14   Darlin Coco, MD  multivitamin [facility]) per tablet Take 1 tablet by mouth daily.     Historical Provider, MD  Naphazoline-Pheniramine (OPCON-A) 0.027-0.315 % SOLN Apply 1 drop to eye 2 (two) times daily as needed (itchy eyes).    Historical Provider, MD  NITROSTAT 0.4 MG SL tablet ONE TABLET UNDER TONGUE AS NEEDED 05/09/14   Darlin Coco, MD  Omega-3 Fatty Acids (FISH OIL) 1000 MG CAPS Take 1,000 mg by mouth 2 (two) times daily.     Historical Provider, MD  potassium chloride SA (K-DUR,KLOR-CON) 20 MEQ tablet TAKE ONE TABLET EVERY DAY 02/08/14   Darlin Coco, MD  simvastatin (ZOCOR) 20 MG tablet Take 10 mg by mouth daily.    Historical Provider, MD  simvastatin (ZOCOR) 20 MG tablet TAKE ONE-HALF TABLET DAILY    Darlin Coco, MD  SYNTHROID 88 MCG tablet TAKE ONE TABLET EVERY DAY 05/09/14   Darlin Coco, MD  TRIBENZOR 40-5-12.5 MG TABS TAKE ONE TABLET EVERY DAY 02/08/14   Darlin Coco, MD  zolpidem (AMBIEN CR) 12.5 MG CR tablet TAKE ONE TABLET AT BEDTIME AS NEEDED 02/08/14   Darlin Coco, MD   BP 143/57 mmHg  Pulse 64  Temp(Src) 97.6 F (36.4 C) (Oral)  Resp 16  SpO2 99% Physical Exam  Constitutional: She is oriented to person, place, and time. She appears  well-developed and well-nourished.  HENT:  Head: Normocephalic.  Mouth/Throat: Oropharynx is clear and moist. No oropharyngeal exudate.  Eyes: Conjunctivae and EOM are normal. Pupils are equal, round, and reactive to light.  Neck: Normal range of motion. Neck supple.  Cardiovascular: Normal rate, regular rhythm, normal heart sounds and intact distal pulses.  Exam reveals no gallop and no friction rub.   No murmur heard. Pulmonary/Chest: Effort normal and breath sounds normal. No respiratory distress. She has no wheezes.  Abdominal: Soft. Bowel sounds are normal. There is no tenderness. There is no rebound and no guarding.  Genitourinary:  Normal-appearing external rectum. Normal rectal exam. Melanotic stool.  Musculoskeletal: Normal range of motion. She  exhibits no edema or tenderness.  Neurological: She is alert and oriented to person, place, and time.  Skin: Skin is warm and dry.  Psychiatric: She has a normal mood and affect. Her behavior is normal.  Nursing note and vitals reviewed.   ED Course  Procedures (including critical care time) Labs Review Labs Reviewed  CBC WITH DIFFERENTIAL/PLATELET - Abnormal; Notable for the following:    RBC 2.72 (*)    Hemoglobin 9.0 (*)    HCT 28.1 (*)    MCV 103.3 (*)    RDW 22.0 (*)    Neutrophils Relative % 83 (*)    Lymphocytes Relative 11 (*)    Lymphs Abs 0.6 (*)    All other components within normal limits  COMPREHENSIVE METABOLIC PANEL - Abnormal; Notable for the following:    Sodium 128 (*)    Chloride 90 (*)    Glucose, Bld 105 (*)    AST 44 (*)    GFR calc non Af Amer 75 (*)    GFR calc Af Amer 87 (*)    All other components within normal limits  TROPONIN I - Abnormal; Notable for the following:    Troponin I 0.04 (*)    All other components within normal limits  POC OCCULT BLOOD, ED - Abnormal; Notable for the following:    Fecal Occult Bld POSITIVE (*)    All other components within normal limits  PROTIME-INR  LACTIC  ACID, PLASMA  URINALYSIS, ROUTINE W REFLEX MICROSCOPIC  OCCULT BLOOD X 1 CARD TO LAB, STOOL  TYPE AND SCREEN  PREPARE RBC (CROSSMATCH)    Imaging Review No results found.   EKG Interpretation   Date/Time:  Friday May 13 2014 11:27:26 EST Ventricular Rate:  57 PR Interval:    QRS Duration: 97 QT Interval:  369 QTC Calculation: 359 R Axis:   36 Text Interpretation:  Atrial fibrillation LVH with secondary  repolarization abnormality Probable anterior infarct, age indeterminate No  significant change since last tracing Confirmed by Byrant Valent  MD, Eron Staat  ([id number]) on 05/13/2014 4:23:54 PM      MDM   Final diagnoses:  Gastrointestinal hemorrhage, unspecified gastritis, unspecified gastrointestinal hemorrhage type  Other fatigue  SOB (shortness of breath)    11:22 AM 81 y.o. female w hx of aortic valve replacement on coumadin, chronic lower gi bleeding who presents with worsening shortness of breath and fatigue over the last 1-2 weeks. She notes that she has had persistently dark stools over the last 10 days. She states that she routinely gets blood work every 2 weeks at the cancer center to monitor her hemoglobin and INR. She states that she got 2 units of blood earlier this week. She is afebrile and vital signs are unremarkable here. She denies any abdominal pain. We'll get screening labs.  Discussed case w/ pt's oncologist and on call for [facility]. Will admit to hospitalist.    Pamella Pert, MD 05/13/14 [phone]

## 2014-05-14 LAB — HEPARIN LEVEL (UNFRACTIONATED)
HEPARIN UNFRACTIONATED: 0.2 [IU]/mL — AB (ref 0.30–0.70)
Heparin Unfractionated: 0.36 IU/mL (ref 0.30–0.70)
Heparin Unfractionated: 0.39 IU/mL (ref 0.30–0.70)

## 2014-05-14 LAB — CBC
HCT: 26 % — ABNORMAL LOW (ref 36.0–46.0)
HEMATOCRIT: 33.3 % — AB (ref 36.0–46.0)
HEMOGLOBIN: 10.9 g/dL — AB (ref 12.0–15.0)
Hemoglobin: 8.4 g/dL — ABNORMAL LOW (ref 12.0–15.0)
MCH: 32.4 pg (ref 26.0–34.0)
MCH: 31.7 pg (ref 26.0–34.0)
MCHC: 32.3 g/dL (ref 30.0–36.0)
MCHC: 32.7 g/dL (ref 30.0–36.0)
MCV: 100.4 fL — AB (ref 78.0–100.0)
MCV: 96.8 fL (ref 78.0–100.0)
PLATELETS: 171 10*3/uL (ref 150–400)
Platelets: 168 10*3/uL (ref 150–400)
RBC: 2.59 MIL/uL — ABNORMAL LOW (ref 3.87–5.11)
RBC: 3.44 MIL/uL — ABNORMAL LOW (ref 3.87–5.11)
RDW: 23 % — AB (ref 11.5–15.5)
RDW: 23.2 % — ABNORMAL HIGH (ref 11.5–15.5)
WBC: 4.1 10*3/uL (ref 4.0–10.5)
WBC: 4.1 10*3/uL (ref 4.0–10.5)

## 2014-05-14 LAB — CBC WITH DIFFERENTIAL/PLATELET
BASOS PCT: 0 % (ref 0–1)
Basophils Absolute: 0 10*3/uL (ref 0.0–0.1)
Eosinophils Absolute: 0 10*3/uL (ref 0.0–0.7)
Eosinophils Relative: 1 % (ref 0–5)
HCT: 35 % — ABNORMAL LOW (ref 36.0–46.0)
Hemoglobin: 11.4 g/dL — ABNORMAL LOW (ref 12.0–15.0)
LYMPHS ABS: 0.5 10*3/uL — AB (ref 0.7–4.0)
Lymphocytes Relative: 11 % — ABNORMAL LOW (ref 12–46)
MCH: 31.5 pg (ref 26.0–34.0)
MCHC: 32.6 g/dL (ref 30.0–36.0)
MCV: 96.7 fL (ref 78.0–100.0)
Monocytes Absolute: 0.3 10*3/uL (ref 0.1–1.0)
Monocytes Relative: 7 % (ref 3–12)
NEUTROS PCT: 81 % — AB (ref 43–77)
Neutro Abs: 3.5 10*3/uL (ref 1.7–7.7)
Platelets: 191 10*3/uL (ref 150–400)
RBC: 3.62 MIL/uL — ABNORMAL LOW (ref 3.87–5.11)
RDW: 22.4 % — ABNORMAL HIGH (ref 11.5–15.5)
WBC: 4.3 10*3/uL (ref 4.0–10.5)

## 2014-05-14 LAB — RETICULOCYTES
RBC.: 3.62 MIL/uL — AB (ref 3.87–5.11)
Retic Count, Absolute: 387.3 10*3/uL — ABNORMAL HIGH (ref 19.0–186.0)
Retic Ct Pct: 10.7 % — ABNORMAL HIGH (ref 0.4–3.1)

## 2014-05-14 LAB — TROPONIN I
Troponin I: 0.06 ng/mL — ABNORMAL HIGH (ref <0.031)
Troponin I: 0.04 ng/mL — ABNORMAL HIGH (ref <0.031)

## 2014-05-14 LAB — BASIC METABOLIC PANEL
Anion gap: 8 (ref 5–15)
BUN: 12 mg/dL (ref 6–23)
CO2: 29 mmol/L (ref 19–32)
Calcium: 9 mg/dL (ref 8.4–10.5)
Chloride: 95 mmol/L — ABNORMAL LOW (ref 96–112)
Creatinine, Ser: 0.77 mg/dL (ref 0.50–1.10)
GFR calc Af Amer: 86 mL/min — ABNORMAL LOW (ref >90)
GFR calc non Af Amer: 74 mL/min — ABNORMAL LOW (ref >90)
Glucose, Bld: 131 mg/dL — ABNORMAL HIGH (ref 70–99)
Potassium: 3.4 mmol/L — ABNORMAL LOW (ref 3.5–5.1)
Sodium: 132 mmol/L — ABNORMAL LOW (ref 135–145)

## 2014-05-14 LAB — PREPARE RBC (CROSSMATCH)

## 2014-05-14 MED ORDER — SODIUM CHLORIDE 0.9 % IV SOLN
Freq: Once | INTRAVENOUS | Status: AC
Start: 2014-05-14 — End: 2014-05-14
  Administered 2014-05-14: 06:00:00 via INTRAVENOUS

## 2014-05-14 MED ORDER — ALPRAZOLAM 0.25 MG PO TABS
0.2500 mg | ORAL_TABLET | Freq: Three times a day (TID) | ORAL | Status: DC | PRN
  Administered 2014-05-14: 0.25 mg via ORAL
  Filled 2014-05-14: qty 1

## 2014-05-14 MED ORDER — FUROSEMIDE 40 MG PO TABS
40.0000 mg | ORAL_TABLET | Freq: Every day | ORAL | Status: DC
  Administered 2014-05-14 – 2014-05-15 (×2): 40 mg via ORAL
  Filled 2014-05-14 (×2): qty 1

## 2014-05-14 MED ORDER — SIMETHICONE 80 MG PO CHEW: 80.0000 mg | CHEWABLE_TABLET | Freq: Four times a day (QID) | ORAL | Status: DC | PRN

## 2014-05-14 MED ORDER — HEPARIN (PORCINE) IN NACL 100-0.45 UNIT/ML-% IJ SOLN
950.0000 [IU]/h | INTRAMUSCULAR | Status: DC
  Administered 2014-05-14 (×2): 950 [IU]/h via INTRAVENOUS
  Filled 2014-05-14: qty 250

## 2014-05-14 MED ORDER — PANTOPRAZOLE SODIUM 40 MG IV SOLR
40.0000 mg | Freq: Two times a day (BID) | INTRAVENOUS | Status: DC
  Administered 2014-05-14 (×2): 40 mg via INTRAVENOUS
  Filled 2014-05-14 (×3): qty 40

## 2014-05-14 NOTE — Progress Notes (Signed)
Patient had a 14 beat run of VTach last night. Patient asleep. Asymptomatic. On call made aware.

## 2014-05-14 NOTE — Progress Notes (Signed)
PHARMACY - HEPARIN (brief note)  Patient on IV heparin gtt @ 800 units/hr for bridge therapy (h/o mechanical aortic valve).  Patient with h/o chronic lower GIB.  Hgb 9 --> 8.4 (2 units PRBC ordered)  Initial heparin level = 0.2 (goal 0.3-0.7)  Plan:  Increase heparin gtt to 950 units/hr            Check heparin level 8 hr after heparin rate increased  Deborah [name], PharmD

## 2014-05-14 NOTE — Progress Notes (Signed)
Patient's post blood transfusion Hgb was 8.4. It had dropped from 9.0. On call notified and new orders were given for 2 units of blood.

## 2014-05-14 NOTE — Progress Notes (Signed)
Subjective: Is hungry. No obvious overt bleeding. No abdominal pain.  Objective: Vital signs in last 24 hours: Temp:  [97.2 F (36.2 C)-98.6 F (37 C)] 98.6 F (37 C) (02/13 0829) Pulse Rate:  [61-115] 82 (02/13 0829) Resp:  [17-20] 18 (02/13 0829) BP: (93-160)/(54-99) 160/96 mmHg (02/13 0829) SpO2:  [90 %-99 %] 98 % (02/13 0829) Weight:  [57.208 kg (126 lb 1.9 oz)] 57.208 kg (126 lb 1.9 oz) (02/12 1726) Weight change:  Last BM Date: 05/14/14  PE: GEN:  Younger-appearing than stated age, NAD ABD:  Soft, mild distended, non-tender.  Lab Results:   Assessment:  1.  Recurrent anemia with intermittent history dark stools.  On chronic warfarin for [facility] mitral valve replacement.  Extensive prior recent GI tract evaluation (Endoscopy, colonoscopy, capsule endoscopy).  Plan:  1.  Would not do any further GI tract evaluation at this time.  Suspect patient is having generalized mucosal bleeding from her GI tract in setting of chronic warfarin therapy.  Doubt there is focal bleeding region amenable to treatment. 2.  Unfortunately, since she needs to be on chronic warfarin, I counseled patient that she will likely continue to have fluctuations in Hgb that may very well require intermittent and periodic transfusions.   3.  Will sign-off; please call with questions; thank you for the consult.   Landry Dyke 05/14/2014, 1:16 PM

## 2014-05-14 NOTE — Progress Notes (Addendum)
ANTICOAGULATION CONSULT NOTE - Initial Consult  Pharmacy Consult for heparin Indication: chronic Warfarin for Afib, mechanical Aortic valve replacement  Allergies  Allergen Reactions  . Amiodarone Shortness Of Breath  . Alendronate Sodium     GI upset  . Macrodantin [Nitrofurantoin]     GI upset  . Meclizine Swelling    tongue  . Norvasc [Amlodipine Besylate]     edema  . Tussionex Pennkinetic Er [Hydrocod Polst-Cpm Polst Er] Other (See Comments)    Reaction unknown   Patient Measurements: Weight: 57.2kg Heparin dosing weight: 57kg  Vital Signs: Temp: 98.6 F (37 C) (02/13 0829) Temp Source: Oral (02/13 0829) BP: 160/96 mmHg (02/13 0829) Pulse Rate: 82 (02/13 0829)  Labs:  Recent Labs  05/12/14 0936 05/13/14 1223 05/14/14 0001 05/14/14 0100 05/14/14 1039  HGB 7.9 Repeated and verified X2.* 9.0* 8.4*  --  11.4*  HCT 23.6 Repeated and verified X2.* 28.1* 26.0*  --  35.0*  PLT 183.0 194 168  --  191  LABPROT 16.8* 15.1  --   --   --   INR 1.5* 1.18  --   --   --   HEPARINUNFRC  --   --   --  0.20* 0.36  CREATININE 0.69 0.74  --   --  0.77  TROPONINI  --  0.04* 0.06*  --  0.04*   Estimated Creatinine Clearance: 40.7 mL/min (by C-G formula based on Cr of 0.77).  Medical History: Past Medical History  Diagnosis Date  . Hypothyroidism   . Stomach problems     seeing Dr Watt Climes  . Hypertension   . GERD (gastroesophageal reflux disease)   . Anemia   . Arrhythmia     Atrial Fibrillation  . Breast cancer     "right; S/P lumpectomy, chemo, XRT"   . Complication of anesthesia     "once I'm awakened, I don't sleep til the following night"  . High cholesterol   . Heart murmur   . History of blood transfusion 1996; ~ 2013    "related to valve replacement; GI bleeding"   . Chronic lower GI bleeding   . Arthritis     "fingers, toes, hips; qwhere" (05/19/2013)  . Gout   . Pneumonia 07/2011   Assessment: 89 yoF admitted 2/12 with persistent weakness. Pt on chronic  warfarin therapy PTA for mechanical AVR and atrial fibrillation managed at [facility]. Pt noted to have chronic lower GIB and notes persistently dark stools over the last 10 days. Hgb down to 6.1 on 2/8 with INR elevated to 5.06. Pt received 2U PRBCs on 2/8, was instructed to hold her warfarin x 3 days and restart with a lower daily dose until recheck scheduled for 2/16. Pharmacy consulted to start heparin for bridge while INR below goal in a patient with a mechanical aortic valve, MD requested no bolus.   PTA home dose Warfarin 5mg  daily except 2.5mg  on Tues and Fridays  Patient instructed to hold warfarin Mon, Tues, and Wed this week (2/8-2/10) and then start taking 2.5mg  daily on 2/11 until recheck  Hgb up to 9.0 on admission, platelets WNL  INR SUBtherapeutic at 1.18 on admit as expected after holding warfarin  Heparin infusion started at 800 units/hr, no bolus  Today 2/13:  First Heparin level 0.2 units/ml, rate increased to 950 units/hr  Second level 0.36, will continue same rate, waiting for Warfarin resume  H/H improved after 2 units PRBC  Goal of Therapy:  Heparin level 0.3-0.7 units/ml  INR 2.5-3.0 Monitor platelets by anticoagulation protocol: Yes   Plan:   Continue Heparin at 950 units/hr  Daily Heparin level, CBC  Recheck Heparin tonight, assure we maintain therapeutic l evel with AVR  Thank you for the consult.  Minda Ditto PharmD Pager [phone] 05/14/2014, 12:22 PM

## 2014-05-14 NOTE — Progress Notes (Signed)
PHARMACY - HEPARIN (brief note)  IV heparin infusing @ 950 units/hr for bridging coverage with h/o mechanical AVR and AFib  Repeat heparin level @ 950 units/hr = 0.39 (goal 0.3-0.7) Confirmatory therapeutic level at current rate  Plan:  Continue IV heparin @ 950 units/hr            F/U AM labs            Note plan to transition to Lovenox/Coumadin on 2/14  Leone Haven, PharmD

## 2014-05-14 NOTE — Progress Notes (Signed)
Patient refused bed alarm. MD okayed patient to be without bed alarm. Will continue to monitor. Night nurse informed.

## 2014-05-14 NOTE — Progress Notes (Signed)
PROGRESS NOTE  ASTRAEA GAUGHRAN CXK:[id number] DOB: [birth year] DOA: 05/13/2014 PCP: Darlin Coco, MD  HPI: 81 year old lady mechanical aortic valve admitted on 2/12 with weakness found to be anemic likely due to ongoing acute on chronic GI bleed.  Subjective / 24 H Interval events - She has no complaints this morning, she endorses one loose black stool - Denies any chest pain or breathing difficulties  Assessment/Plan: Active Problems:   Hypothyroidism   S/P aortic valve replacement with metallic valve   Hyponatremia   Long term current use of anticoagulant therapy   Atrial fibrillation   Malaise and fatigue   Iron deficiency anemia due to chronic blood loss   Melena   GI bleed    GI bleed - appreciate gastroenterology input, agree with Dr. Paulita Fujita - She continued to require blood transfusion, she is now status post 3 units total with hemoglobin now up to 11.4 - She is in a very difficult situation overall, will continue to monitor her CBC, and if her hemoglobin remains stable tonight and tomorrow morning, she will be able to go home on Lovenox/Coumadin. She will definitely need frequent outpatient monitoring of her CBC, she may have further need for transfusions  Mechanical aortic valve requiring long-term anticoagulation - continue heparin today, and transitioned to Lovenox/Coumadin tomorrow  Atrial fibrillation - continue anticoagulation and metoprolol  Hypothyroidism - continue Synthroid   Diet: Diet regular Fluids: None DVT Prophylaxis: Heparin  Code Status: DNR Family Communication: None at bedside  Disposition Plan: Hopefully home tomorrow  Consultants:  Gastroenterology  Procedures:  None   Antibiotics  Anti-infectives    None       Studies  No results found.  Objective  Filed Vitals:   05/14/14 0546 05/14/14 0618 05/14/14 0829 05/14/14 1400  BP: 148/59 123/66 160/96 152/61  Pulse: 62 63 82 86  Temp: 97.2 F (36.2 C) 98.3 F (36.8  C) 98.6 F (37 C) 98.2 F (36.8 C)  TempSrc: Oral Oral Oral Oral  Resp: 18 20 18 20   Height:      Weight:      SpO2: 97% 97% 98% 100%    Intake/Output Summary (Last 24 hours) at 05/14/14 1522 Last data filed at 05/14/14 1002  Gross per 24 hour  Intake 1755.83 ml  Output      0 ml  Net 1755.83 ml   Filed Weights   05/13/14 1726  Weight: 57.208 kg (126 lb 1.9 oz)    Exam:  General:  She is in no apparent distress,  HEENT: No scleral icterus  Cardiovascular: Irregular  Respiratory: Clear to auscultation, no wheezing  Abdomen: Soft, nontender  Skin: No rashes  Neuro: Nonfocal  Data Reviewed: Basic Metabolic Panel:  Recent Labs Lab 05/12/14 0936 05/13/14 1223 05/14/14 1039  NA 132* 128* 132*  K 4.2 4.6 3.4*  CL 98 90* 95*  CO2 30 26 29   GLUCOSE 127* 105* 131*  BUN 20 17 12   CREATININE 0.69 0.74 0.77  CALCIUM 9.4 9.5 9.0   Liver Function Tests:  Recent Labs Lab 05/12/14 0936 05/13/14 1223  AST 22 44*  ALT 22 32  ALKPHOS 44 48  BILITOT 0.4 0.8  PROT 5.2* 6.2  ALBUMIN 3.4* 3.7   CBC:  Recent Labs Lab 05/09/14 1054 05/12/14 0936 05/13/14 1223 05/14/14 0001 05/14/14 1039  WBC 6.2 6.2 5.1 4.1 4.3  NEUTROABS 5.0 5.2 4.2  --  3.5  HGB 6.1* 7.9 Repeated and verified X2.* 9.0* 8.4* 11.4*  HCT 19.5*  23.6 Repeated and verified X2.* 28.1* 26.0* 35.0*  MCV 104.3* 98.0 103.3* 100.4* 96.7  PLT 156 183.0 194 168 191   Cardiac Enzymes:  Recent Labs Lab 05/13/14 1223 05/14/14 0001 05/14/14 1039  TROPONINI 0.04* 0.06* 0.04*    Scheduled Meds: . allopurinol  300 mg Oral Daily  . digoxin  125 mcg Oral Daily  . furosemide  40 mg Oral Daily  . levothyroxine  88 mcg Oral QAC breakfast  . metoprolol tartrate  12.5 mg Oral BID  . pantoprazole (PROTONIX) IV  40 mg Intravenous Q12H  . simvastatin  5 mg Oral q1800  . sodium chloride  3 mL Intravenous Q12H   Continuous Infusions: . heparin 950 Units/hr (05/14/14 0225)    Marzetta Board,  MD [facility] Pager [phone]. If 7 PM - 7 AM, please contact night-coverage at [email or web address], password [facility] 05/14/2014, 3:22 PM  LOS: 1 day

## 2014-05-15 ENCOUNTER — Other Ambulatory Visit: Payer: Self-pay | Admitting: Oncology

## 2014-05-15 DIAGNOSIS — Z954 Presence of other heart-valve replacement: Secondary | ICD-10-CM

## 2014-05-15 DIAGNOSIS — I481 Persistent atrial fibrillation: Secondary | ICD-10-CM

## 2014-05-15 LAB — CBC
HEMATOCRIT: 33.8 % — AB (ref 36.0–46.0)
Hemoglobin: 10.8 g/dL — ABNORMAL LOW (ref 12.0–15.0)
MCH: 31.2 pg (ref 26.0–34.0)
MCHC: 32 g/dL (ref 30.0–36.0)
MCV: 97.7 fL (ref 78.0–100.0)
PLATELETS: 164 10*3/uL (ref 150–400)
RBC: 3.46 MIL/uL — AB (ref 3.87–5.11)
RDW: 23.4 % — AB (ref 11.5–15.5)
WBC: 3.8 10*3/uL — AB (ref 4.0–10.5)

## 2014-05-15 LAB — FERRITIN: Ferritin: 285 ng/mL (ref 10–291)

## 2014-05-15 LAB — HEPARIN LEVEL (UNFRACTIONATED): Heparin Unfractionated: 0.52 IU/mL (ref 0.30–0.70)

## 2014-05-15 MED ORDER — PANTOPRAZOLE SODIUM 40 MG PO TBEC
40.0000 mg | DELAYED_RELEASE_TABLET | Freq: Every day | ORAL | Status: DC
  Administered 2014-05-15: 40 mg via ORAL
  Filled 2014-05-15: qty 1

## 2014-05-15 MED ORDER — ENOXAPARIN SODIUM 100 MG/ML ~~LOC~~ SOLN
1.5000 mg/kg | SUBCUTANEOUS | Status: DC
  Administered 2014-05-15: 85 mg via SUBCUTANEOUS
  Filled 2014-05-15: qty 1

## 2014-05-15 MED ORDER — OMEPRAZOLE 40 MG PO CPDR: 40.0000 mg | DELAYED_RELEASE_CAPSULE | Freq: Every day | ORAL | Status: DC

## 2014-05-15 MED ORDER — ENOXAPARIN SODIUM 100 MG/ML ~~LOC~~ SOLN: 1.5000 mg/kg | SUBCUTANEOUS | Status: DC

## 2014-05-15 MED ORDER — ALPRAZOLAM 0.25 MG PO TABS: 0.2500 mg | ORAL_TABLET | Freq: Three times a day (TID) | ORAL | Status: DC | PRN

## 2014-05-15 NOTE — Progress Notes (Signed)
ANTICOAGULATION CONSULT NOTE - Initial Consult  Pharmacy Consult for heparin Indication: chronic Warfarin for Afib, mechanical Aortic valve replacement  Allergies  Allergen Reactions  . Amiodarone Shortness Of Breath  . Alendronate Sodium     GI upset  . Macrodantin [Nitrofurantoin]     GI upset  . Meclizine Swelling    tongue  . Norvasc [Amlodipine Besylate]     edema  . Tussionex Pennkinetic Er [Hydrocod Polst-Cpm Polst Er] Other (See Comments)    Reaction unknown   Patient Measurements: Weight: 57.2kg Heparin dosing weight: 57kg  Vital Signs: Temp: 98.3 F (36.8 C) (02/14 0449) Temp Source: Oral (02/14 0449) BP: 126/97 mmHg (02/14 0449) Pulse Rate: 63 (02/14 0449)  Labs:  Recent Labs  05/12/14 0936 05/13/14 1223 05/14/14 0001  05/14/14 1039 05/14/14 2028 05/15/14 0524  HGB 7.9 Repeated and verified X2.* 9.0* 8.4*  --  11.4* 10.9* 10.8*  HCT 23.6 Repeated and verified X2.* 28.1* 26.0*  --  35.0* 33.3* 33.8*  PLT 183.0 194 168  --  191 171 164  LABPROT 16.8* 15.1  --   --   --   --   --   INR 1.5* 1.18  --   --   --   --   --   HEPARINUNFRC  --   --   --   < > 0.36 0.39 0.52  CREATININE 0.69 0.74  --   --  0.77  --   --   TROPONINI  --  0.04* 0.06*  --  0.04*  --   --   < > = values in this interval not displayed. Estimated Creatinine Clearance: 40.7 mL/min (by C-G formula based on Cr of 0.77).  Medical History: Past Medical History  Diagnosis Date  . Hypothyroidism   . Stomach problems     seeing Dr Watt Climes  . Hypertension   . GERD (gastroesophageal reflux disease)   . Anemia   . Arrhythmia     Atrial Fibrillation  . Breast cancer     "right; S/P lumpectomy, chemo, XRT"   . Complication of anesthesia     "once I'm awakened, I don't sleep til the following night"  . High cholesterol   . Heart murmur   . History of blood transfusion 1996; ~ 2013    "related to valve replacement; GI bleeding"   . Chronic lower GI bleeding   . Arthritis    "fingers, toes, hips; qwhere" (05/19/2013)  . Gout   . Pneumonia 07/2011   Assessment: 60 yoF admitted 2/12 with persistent weakness. Pt on chronic warfarin therapy PTA for mechanical AVR and atrial fibrillation managed at [facility]. Pt noted to have chronic lower GIB and notes persistently dark stools over the last 10 days. Hgb down to 6.1 on 2/8 with INR elevated to 5.06. Pt received 2U PRBCs on 2/8, was instructed to hold her warfarin x 3 days and restart with a lower daily dose until recheck scheduled for 2/16. Pharmacy consulted to start heparin for bridge while INR below goal in a patient with a mechanical aortic valve, MD requested no bolus.   PTA home dose Warfarin 5mg  daily except 2.5mg  on Tues and Fridays  Patient instructed to hold warfarin Mon, Tues, and Wed this week (2/8-2/10) and then start taking 2.5mg  daily on 2/11 until recheck  Hgb up to 9.0 on admission, platelets WNL  INR SUBtherapeutic at 1.18 on admit as expected after holding warfarin  Heparin infusion started at 800 units/hr, no  bolus  2/13:  First Heparin level 0.2 units/ml, rate increased to 950 units/hr  Second level 0.36, will continue same rate, waiting for Warfarin resume  H/H improved after 2 units PRBC  Pm level in range at 0.39 units/ml  Today 2/14:  Heparin level 0.52, in range  Probable change to Lovenox/Warfarin today per MD note  Total 3 units PRBC: H/H 10.8/33.8, Plt 164, no overt bleed  Goal of Therapy:  Heparin level 0.3-0.7 units/ml INR 2.5-3.0 Monitor platelets by anticoagulation protocol: Yes   Plan:   Continue Heparin at 950 units/hr  Daily Heparin level, CBC  Order daily PT/INR starting tomorrow  Thank you for the consult.  Minda Ditto PharmD Pager [phone] 05/15/2014, 7:40 AM

## 2014-05-15 NOTE — Discharge Summary (Signed)
Physician Discharge Summary  Deborah [name] EGB:[id number] DOB: 01/13/1927 DOA: 05/13/2014  PCP: Darlin Coco, MD  Admit date: 05/13/2014 Discharge date: 05/15/2014  Time spent: 45 minutes  Recommendations for Outpatient Follow-up:  1. Follow up with Oncology for INR check in 2 days 2. Continue Lovenox until INR is in therapeutic range 3. Follow-up with Dr. Jana Hakim in 1-2 weeks 4. Follow up with Dr. Mare Ferrari 1-2 weeks   Discharge Diagnoses:  Active Problems:   Hypothyroidism   S/P aortic valve replacement with metallic valve   Hyponatremia   Long term current use of anticoagulant therapy   Atrial fibrillation   Malaise and fatigue   Iron deficiency anemia due to chronic blood loss   Melena   GI bleed  Discharge Condition: stable  Diet recommendation: heart healthy  Filed Weights   05/13/14 1726  Weight: 57.208 kg (126 lb 1.9 oz)   History of present illness:  Deborah [name] is a 81 y.o. female has a past medical history significant for atrial fibrillation, mechanical aortic valve, recurrent GI bleeds, hyperlipidemia, hypertension, presents to the emergency room with a chief complaint of weakness. The patient has been on Coumadin since her valve replacement in 1996, and she has been having in the last few years several episodes of melena concerning for chronic low-grade GI bleeding, she was extensively evaluated by gastroenterology, she underwent capsule study last year, and other than a few colonic AVMs there was no major source of bleeding identified. She has recently traveled to [place] where she spent a week, and when she got back she had an INR checked and was found to be elevated at 5. Around the same time patient has noticed that she's been having dark tarry stools. She denies any diarrhea or frank bleeding, she has one formed bowel movement per day which is black. She is followed by Dr. Jana Hakim from oncology for her chronic anemia and chronic iron deficiency. She  was seen in cancer center on Monday 2/8 and at that time her hemoglobin was 6.1. She had 2 units of blood and her hemoglobin improved to 7.9 on 2/11. She continues to feel weak and with lack of energy, she discussed with her cardiologist Dr. Mare Ferrari and her oncologist, and she decided to come to the emergency room. A repeat hemoglobin in the ED is 9.0. She continues to be symptomatic. She denies any chest pain, breathing difficulties, she denies any dizziness, she denies any stomach pain, nausea vomiting or diarrhea. She denies any fevers. Her INR in the emergency room was 1.18   Hospital Course:  Patient was admitted to the hospital in the setting of a GI bleed. She recently traveled to [place] and has been having black stools, and her INR was found to be elevated to 5, she had profound anemia with a hemoglobin of 6 and associated weakness. On admission to the hospital, her hemoglobin was increased from 9, showing good response to her outpatient transfusions. She was still having black stools, however clinically she appeared to have stopped bleeding. Gastroenterology was consulted and have followed patient while hospitalized, no need for an urgent EGD. Her INR was unfortunately 1.18 on admission, and she was started on heparin drip. She was closely monitor in the inpatient setting given recent GI bleed and anticoagulation. She was transfused 1 unit of packed red blood cell to bring her hemoglobin above 10. She had a drop in hemoglobin to 8.4 from 9.0, however I feel like that was likely dilutional as all  cells went down. She received 2 additional blood transfusions, and her hemoglobin has improved adequately and has remained stable. At the time of discharge her hemoglobin is 10.8. On the day of discharge her black stools have resolved. Because of subtherapeutic INR on admission and the presence of atrial fibrillation and mechanical aortic valve, she was started on Lovenox injection along with Coumadin for  bridging. She was discharged home in stable condition, she has a follow-up appointment in 2 days for INR check. I started her on PPI on discharge.  Patient clearly expressed her wishes to be a DO NOT RESUSCITATE, I have filled out the DNR yellow form.  Procedures:  None    Consultations:  GI  Discharge Exam: Filed Vitals:   05/14/14 0829 05/14/14 1400 05/14/14 2123 05/15/14 0449  BP: 160/96 152/61 153/70 126/97  Pulse: 82 86 71 63  Temp: 98.6 F (37 C) 98.2 F (36.8 C) 97.8 F (36.6 C) 98.3 F (36.8 C)  TempSrc: Oral Oral Oral Oral  Resp: 18 20 20 20   Height:      Weight:      SpO2: 98% 100% 99% 93%    General: NAD Cardiovascular: irregular, mechanical click  Respiratory: CTA biL  Discharge Instructions     Medication List    STOP taking these medications        CELEBREX 200 MG capsule  Generic drug:  celecoxib     celecoxib 200 MG capsule  Commonly known as:  CELEBREX      TAKE these medications        acetaminophen 500 MG tablet  Commonly known as:  TYLENOL  Take 1,000 mg by mouth every 6 (six) hours as needed for headache.     ALLEGRA ALLERGY 180 MG tablet  Generic drug:  fexofenadine  Take 180 mg by mouth daily as needed for allergies.     allopurinol 300 MG tablet  Commonly known as:  ZYLOPRIM  TAKE ONE TABLET EVERY DAY     ALPRAZolam 0.25 MG tablet  Commonly known as:  XANAX  Take 1 tablet (0.25 mg total) by mouth 3 (three) times daily as needed for anxiety.     BENTYL 10 MG capsule  Generic drug:  dicyclomine  Take 10 mg by mouth daily as needed for spasms.     calcium-vitamin D 500 MG tablet  Take 1 tablet by mouth 2 (two) times daily.     COUMADIN 5 MG tablet  Generic drug:  warfarin  TAKE ONE TABLET EACH DAY OR TAKE AS DIRECTED BY COUMADIN CLINIC     enoxaparin 100 MG/ML injection  Commonly known as:  LOVENOX  Inject 0.85 mLs (85 mg total) into the skin daily.     Fish Oil 1000 MG Caps  Take 1,000 mg by mouth 2 (two) times  daily.     furosemide 40 MG tablet  Commonly known as:  LASIX  TAKE ONE TABLET EACH DAY     LANOXIN 0.25 MG tablet  Generic drug:  digoxin  TAKE 1/2 TABLET DAILY     metoprolol tartrate 25 MG tablet  Commonly known as:  LOPRESSOR  TAKE ONE-HALF TABLET IN THE MORNING AND TAKE ONE TABLET IN THE EVENING     multivitamin per tablet  Take 1 tablet by mouth daily.     NITROSTAT 0.4 MG SL tablet  Generic drug:  nitroGLYCERIN  ONE TABLET UNDER TONGUE AS NEEDED     omeprazole 40 MG capsule  Commonly known as:  PRILOSEC  Take 1 capsule (40 mg total) by mouth daily before lunch.     potassium chloride SA 20 MEQ tablet  Commonly known as:  K-DUR,KLOR-CON  TAKE ONE TABLET EVERY DAY     simvastatin 20 MG tablet  Commonly known as:  ZOCOR  Take 10 mg by mouth daily.     simvastatin 20 MG tablet  Commonly known as:  ZOCOR  TAKE ONE-HALF TABLET DAILY     SYNTHROID 88 MCG tablet  Generic drug:  levothyroxine  TAKE ONE TABLET EVERY DAY     TRIBENZOR 40-5-12.5 MG Tabs  Generic drug:  Olmesartan-Amlodipine-HCTZ  TAKE ONE TABLET EVERY DAY     zolpidem 12.5 MG CR tablet  Commonly known as:  AMBIEN CR  TAKE ONE TABLET AT BEDTIME AS NEEDED           Follow-up Information    Follow up with Darlin Coco, MD. Schedule an appointment as soon as possible for a visit in 2 weeks.   Specialty:  Cardiology   Contact information:   [address] [place] [address] [phone]       Follow up with [name], MD. Schedule an appointment as soon as possible for a visit in 1 week.   Specialty:  Oncology   Contact information:   [place] [address] [phone]       The results of significant diagnostics from this hospitalization (including imaging, microbiology, ancillary and laboratory) are listed below for reference.    Labs: Basic Metabolic Panel:  Recent Labs Lab 05/12/14 0936 05/13/14 1223 05/14/14 1039  NA 132* 128*  132*  K 4.2 4.6 3.4*  CL 98 90* 95*  CO2 30 26 29   GLUCOSE 127* 105* 131*  BUN 20 17 12   CREATININE 0.69 0.74 0.77  CALCIUM 9.4 9.5 9.0   Liver Function Tests:  Recent Labs Lab 05/12/14 0936 05/13/14 1223  AST 22 44*  ALT 22 32  ALKPHOS 44 48  BILITOT 0.4 0.8  PROT 5.2* 6.2  ALBUMIN 3.4* 3.7   CBC:  Recent Labs Lab 05/09/14 1054  05/12/14 0936 05/13/14 1223 05/14/14 0001 05/14/14 1039 05/14/14 2028 05/15/14 0524  WBC 6.2  < > 6.2 5.1 4.1 4.3 4.1 3.8*  NEUTROABS 5.0  --  5.2 4.2  --  3.5  --   --   HGB 6.1*  < > 7.9 Repeated and verified X2.* 9.0* 8.4* 11.4* 10.9* 10.8*  HCT 19.5*  < > 23.6 Repeated and verified X2.* 28.1* 26.0* 35.0* 33.3* 33.8*  MCV 104.3*  < > 98.0 103.3* 100.4* 96.7 96.8 97.7  PLT 156  < > 183.0 194 168 191 171 164  < > = values in this interval not displayed. Cardiac Enzymes:  Recent Labs Lab 05/13/14 1223 05/14/14 0001 05/14/14 1039  TROPONINI 0.04* 0.06* 0.04*    Signed:  Caresse Sedivy  [facility] 05/15/2014, 12:28 PM

## 2014-05-15 NOTE — Discharge Instructions (Signed)
Follow with Darlin Coco, MD in 5-7 days  Please get a complete blood count and chemistry panel checked by your Primary MD at your next visit, and again as instructed by your Primary MD. Please get your medications reviewed and adjusted by your Primary MD.  Please request your Primary MD to go over all Hospital Tests and Procedure/Radiological results at the follow up, please get all Hospital records sent to your Prim MD by signing hospital release before you go home.  If you had Pneumonia of Lung problems at the Hospital: Please get a 2 view Chest X ray done in 6-8 weeks after hospital discharge or sooner if instructed by your Primary MD.  If you have Congestive Heart Failure: Please call your Cardiologist or Primary MD anytime you have any of the following symptoms:  1) 3 pound weight gain in 24 hours or 5 pounds in 1 week  2) shortness of breath, with or without a dry hacking cough  3) swelling in the hands, feet or stomach  4) if you have to sleep on extra pillows at night in order to breathe  Follow cardiac low salt diet and 1.5 lit/day fluid restriction.  If you have diabetes Accuchecks 4 times/day, Once in AM empty stomach and then before each meal. Log in all results and show them to your primary doctor at your next visit. If any glucose reading is under 80 or above 300 call your primary MD immediately.  If you have Seizure/Convulsions/Epilepsy: Please do not drive, operate heavy machinery, participate in activities at heights or participate in high speed sports until you have seen by Primary MD or a Neurologist and advised to do so again.  If you had Gastrointestinal Bleeding: Please ask your Primary MD to check a complete blood count within one week of discharge or at your next visit. Your endoscopic/colonoscopic biopsies that are pending at the time of discharge, will also need to followed by your Primary MD.  Get Medicines reviewed and adjusted. Please take all your  medications with you for your next visit with your Primary MD  Please request your Primary MD to go over all hospital tests and procedure/radiological results at the follow up, please ask your Primary MD to get all Hospital records sent to his/her office.  If you experience worsening of your admission symptoms, develop shortness of breath, life threatening emergency, suicidal or homicidal thoughts you must seek medical attention immediately by calling 911 or calling your MD immediately  if symptoms less severe.  You must read complete instructions/literature along with all the possible adverse reactions/side effects for all the Medicines you take and that have been prescribed to you. Take any new Medicines after you have completely understood and accpet all the possible adverse reactions/side effects.   Do not drive or operate heavy machinery when taking Pain medications.   Do not take more than prescribed Pain, Sleep and Anxiety Medications  Special Instructions: If you have smoked or chewed Tobacco  in the last 2 yrs please stop smoking, stop any regular Alcohol  and or any Recreational drug use.  Wear Seat belts while driving.  Please note You were cared for by a hospitalist during your hospital stay. If you have any questions about your discharge medications or the care you received while you were in the hospital after you are discharged, you can call the unit and asked to speak with the hospitalist on call if the hospitalist that took care of you is not available. Once  you are discharged, your primary care physician will handle any further medical issues. Please note that NO REFILLS for any discharge medications will be authorized once you are discharged, as it is imperative that you return to your primary care physician (or establish a relationship with a primary care physician if you do not have one) for your aftercare needs so that they can reassess your need for medications and monitor your  lab values.  You can reach the hospitalist office at phone [phone] or fax [phone]   If you do not have a primary care physician, you can call [phone] for a physician referral.  Activity: As tolerated with Full fall precautions use walker/cane & assistance as needed  Diet: heart healthy  Disposition Home

## 2014-05-17 ENCOUNTER — Telehealth: Payer: Self-pay | Admitting: *Deleted

## 2014-05-17 ENCOUNTER — Ambulatory Visit (HOSPITAL_BASED_OUTPATIENT_CLINIC_OR_DEPARTMENT_OTHER): Payer: Medicare Other | Admitting: Pharmacist

## 2014-05-17 ENCOUNTER — Other Ambulatory Visit (HOSPITAL_BASED_OUTPATIENT_CLINIC_OR_DEPARTMENT_OTHER): Payer: Medicare Other

## 2014-05-17 DIAGNOSIS — D689 Coagulation defect, unspecified: Secondary | ICD-10-CM

## 2014-05-17 DIAGNOSIS — Z7901 Long term (current) use of anticoagulants: Secondary | ICD-10-CM

## 2014-05-17 DIAGNOSIS — I4891 Unspecified atrial fibrillation: Secondary | ICD-10-CM

## 2014-05-17 DIAGNOSIS — Z954 Presence of other heart-valve replacement: Secondary | ICD-10-CM

## 2014-05-17 DIAGNOSIS — D500 Iron deficiency anemia secondary to blood loss (chronic): Secondary | ICD-10-CM

## 2014-05-17 LAB — TYPE AND SCREEN
ABO/RH(D): O POS
Antibody Screen: NEGATIVE
Unit division: 0
Unit division: 0
Unit division: 0
Unit division: 0
Unit division: 0

## 2014-05-17 LAB — CBC WITH DIFFERENTIAL/PLATELET
BASO%: 0.2 % (ref 0.0–2.0)
BASOS ABS: 0 10*3/uL (ref 0.0–0.1)
EOS%: 0.8 % (ref 0.0–7.0)
Eosinophils Absolute: 0 10*3/uL (ref 0.0–0.5)
HCT: 35.4 % (ref 34.8–46.6)
HGB: 11.4 g/dL — ABNORMAL LOW (ref 11.6–15.9)
LYMPH%: 10.6 % — ABNORMAL LOW (ref 14.0–49.7)
MCH: 31.7 pg (ref 25.1–34.0)
MCHC: 32.2 g/dL (ref 31.5–36.0)
MCV: 98.3 fL (ref 79.5–101.0)
MONO#: 0.4 10*3/uL (ref 0.1–0.9)
MONO%: 7.1 % (ref 0.0–14.0)
NEUT#: 4.1 10*3/uL (ref 1.5–6.5)
NEUT%: 81.3 % — ABNORMAL HIGH (ref 38.4–76.8)
NRBC: 0 % (ref 0–0)
Platelets: 175 10*3/uL (ref 145–400)
RBC: 3.6 10*6/uL — AB (ref 3.70–5.45)
RDW: 22.3 % — AB (ref 11.2–14.5)
WBC: 5.1 10*3/uL (ref 3.9–10.3)
lymph#: 0.5 10*3/uL — ABNORMAL LOW (ref 0.9–3.3)

## 2014-05-17 LAB — PROTIME-INR
INR: 1.1 — ABNORMAL LOW (ref 2.00–3.50)
Protime: 13.2 Seconds (ref 10.6–13.4)

## 2014-05-17 LAB — POCT INR: INR: 1.1

## 2014-05-17 NOTE — Progress Notes (Signed)
Pt here post hospitalization for low hemaglobin requiring a tranfusion D/C on Sunday on lovenox 85 mg daily and coumadin 2.5 mg Tue and Fri with 5mg  other days INR today is 1.1. Pt states her bowels are back to normal (not jet black)  And she is not having any other signs of bleeding. Her d/c instructions were to see [facility] within a week.  He has worked her in for 05/18/14 at 10:00 She plans to discuss anticoagulation options for her.  Questioning if coumadin is the best option for her. She was told by gastro MD that all test have been run and results are normal.   They think her tissue is just thin and it can "leak" blood causing her stools to be jet black and her hemaglobin to drop drastically quickly. She is wondering if daily lovenox is an option for her. She plans to discuss options tomorrow   Pt will continue her coumadin and lovenox Coumadin 2.5 Tue and Fri with 5mg  other days Lovenox is 85mg  daily She started this regimen on Sunday (05/15/14) Will continue until we see her back in [place] on Friday. MD will see her tomorrow at 10:00.

## 2014-05-17 NOTE — Patient Instructions (Addendum)
Pt will continue her coumadin and lovenox Coumadin 2.5 Tue and Fri with 5mg  other days Lovenox is 85mg  daily She started this regimen on Sunday Will continue until we see her back in [place] on Friday. MD will see her tomorrow at 10:00.

## 2014-05-17 NOTE — Telephone Encounter (Signed)
Appointment made for follow up with MD per d/c from hospital.  Patient aware of appt per coumadin clinic visit today.

## 2014-05-18 ENCOUNTER — Other Ambulatory Visit: Payer: Self-pay | Admitting: *Deleted

## 2014-05-18 ENCOUNTER — Ambulatory Visit (HOSPITAL_BASED_OUTPATIENT_CLINIC_OR_DEPARTMENT_OTHER): Payer: Medicare Other | Admitting: Oncology

## 2014-05-18 ENCOUNTER — Telehealth: Payer: Self-pay | Admitting: Cardiology

## 2014-05-18 VITALS — BP 151/69 | HR 52 | Temp 98.1°F | Resp 18 | Ht 60.0 in | Wt 123.3 lb

## 2014-05-18 DIAGNOSIS — Z7901 Long term (current) use of anticoagulants: Secondary | ICD-10-CM

## 2014-05-18 DIAGNOSIS — I4891 Unspecified atrial fibrillation: Secondary | ICD-10-CM

## 2014-05-18 DIAGNOSIS — K31811 Angiodysplasia of stomach and duodenum with bleeding: Secondary | ICD-10-CM

## 2014-05-18 DIAGNOSIS — I4819 Other persistent atrial fibrillation: Secondary | ICD-10-CM

## 2014-05-18 DIAGNOSIS — Z954 Presence of other heart-valve replacement: Secondary | ICD-10-CM

## 2014-05-18 DIAGNOSIS — D500 Iron deficiency anemia secondary to blood loss (chronic): Secondary | ICD-10-CM

## 2014-05-18 DIAGNOSIS — D689 Coagulation defect, unspecified: Secondary | ICD-10-CM

## 2014-05-18 NOTE — Progress Notes (Signed)
ID: [name]   DOB: 30-Jul-1926  MR#: [id number]  EVO#:[id number]  PCP: Darlin Coco, MD GYN:  SU:  OTHER MD: Clarene Essex, Rema Jasmine   HISTORY OF PRESENT ILLNESS: Deborah [name]") Deborah [name] is an 81 year old [place] woman referred by Dr. Watt Climes for evaluation and treatment of iron deficiency anemia.  INTERVAL HISTORY: Deborah [name] returns today for followup of her acute blood loss/iron deficiency anemia accompanied by her daughter [name]. Since her last visit here she had another episode of GI bleeding requiring hospitalization. She received a total of 4 units of blood. Her hemoglobin has stabilized. In the meantime her Coumadin was held and her INR is now subtherapeutic. She is being bridged with Lovenox at 1.5 mg/kg, which she is administering herself.  REVIEW OF SYSTEMS: She Likes the Lovenox, does not mind injecting herself, and would be very interested in switching to it if we felt that was appropriate. She does not like the uncertainties of her Coumadin dosing, but on the other hand she understands that Coumadin is reversible whereas Lovenox is not. Currently she is having no GI bleeding in her bowel movements are not black tarry or bloody. She feels fine. She does feel anxious every morning waiting to see what color bowel movements she will have. Her arms are "one bruise" because of the multiple venipunctures, which are becoming more difficult. A detailed review of systems today was otherwise stable PAST MEDICAL HISTORY: Past Medical History  Diagnosis Date  . Hypothyroidism   . Stomach problems     seeing Dr Watt Climes  . Hypertension   . GERD (gastroesophageal reflux disease)   . Anemia   . Arrhythmia     Atrial Fibrillation  . Breast cancer     "right; S/P lumpectomy, chemo, XRT"   . Complication of anesthesia     "once I'm awakened, I don't sleep til the following night"  . High cholesterol   . Heart murmur   . History of blood transfusion 1996; ~ 2013    "related  to valve replacement; GI bleeding"   . Chronic lower GI bleeding   . Arthritis     "fingers, toes, hips; qwhere" (05/19/2013)  . Gout   . Pneumonia 07/2011    PAST SURGICAL HISTORY: Past Surgical History  Procedure Laterality Date  . Arteriogram  01/12    NO RAS   . Cardioversion      X 2  . Cardiac catheterization    . Cataract extraction w/ intraocular lens  implant, bilateral Bilateral     bilateral cataract with lens implants  . Cholecystectomy    . Abdominal hysterectomy    . Appendectomy    . Colonoscopy with propofol N/A 11/24/2012    Procedure: COLONOSCOPY WITH PROPOFOL;  Surgeon: Jeryl Columbia, MD;  Location: [facility];  Service: Endoscopy;  Laterality: N/A;  . Breast biopsy Right   . Breast lumpectomy Right   . Aortic valve replacement  1996    [employer]. [employer]  . Cardiac valve replacement  1996    FAMILY HISTORY Family History  Problem Relation Age of Onset  . Dementia Mother   . Coronary artery disease Father    the patient's father died from a myocardial infarction at the age of 78. The patient's mother died with Alzheimer's disease at the age of 85. She had 2 brothers and one sister. Her sister had colon cancer diagnosed at age 46. Otherwise the only other person with cancer in the family was the  patient's mother's twin sister, diagnosed with breast cancer at age 36.  GYNECOLOGIC HISTORY: Menarche age 61, first live birth age 82, she is [place] P3. Underwent menopause 1972. She took hormone replacement for approximately 21 years.  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 Her daughter Dixon Boos runs the [employer] here. Daughter [name] also lives in [place], is a homemaker. Son [name] "[name]" Johnsen is a Scientist, research (medical). The patient has of 10 grandchildren She attends a [employer].   ADVANCED DIRECTIVES: Living will in place. Her daughter Dixon Boos is her healthcare power of attorney.  HEALTH  MAINTENANCE: History  Substance Use Topics  . Smoking status: Former Smoker -- 1.00 packs/day for 20 years    Types: Cigarettes    Quit date: 04/01/1988  . Smokeless tobacco: Never Used  . Alcohol Use: 4.8 oz/week    4 Glasses of wine, 4 Shots of liquor per week     Comment: burbon or glass wine daily     Allergies  Allergen Reactions  . Amiodarone Shortness Of Breath  . Alendronate Sodium     GI upset  . Macrodantin [Nitrofurantoin]     GI upset  . Meclizine Swelling    tongue  . Norvasc [Amlodipine Besylate]     edema  . Tussionex Pennkinetic Er [Hydrocod Polst-Cpm Polst Er] Other (See Comments)    Reaction unknown    Current Outpatient Prescriptions  Medication Sig Dispense Refill  . acetaminophen (TYLENOL) 500 MG tablet Take 1,000 mg by mouth every 6 (six) hours as needed for headache.    . allopurinol (ZYLOPRIM) 300 MG tablet TAKE ONE TABLET EVERY DAY 30 tablet 1  . ALPRAZolam (XANAX) 0.25 MG tablet Take 1 tablet (0.25 mg total) by mouth 3 (three) times daily as needed for anxiety. 30 tablet 0  . calcium-vitamin D 500 MG tablet Take 1 tablet by mouth 2 (two) times daily.      Marland Kitchen COUMADIN 5 MG tablet TAKE ONE TABLET EACH DAY OR TAKE AS DIRECTED BY COUMADIN CLINIC (Patient taking differently: TAKE half of a tablet daily as directed by coumadin clinic.) 30 tablet 6  . dicyclomine (BENTYL) 10 MG capsule Take 10 mg by mouth daily as needed for spasms.     Marland Kitchen enoxaparin (LOVENOX) 100 MG/ML injection Inject 0.85 mLs (85 mg total) into the skin daily. 7 Syringe 1  . fexofenadine (ALLEGRA ALLERGY) 180 MG tablet Take 180 mg by mouth daily as needed for allergies.     . furosemide (LASIX) 40 MG tablet TAKE ONE TABLET EACH DAY 30 tablet 6  . LANOXIN 250 MCG tablet TAKE 1/2 TABLET DAILY 30 tablet 1  . metoprolol tartrate (LOPRESSOR) 25 MG tablet TAKE ONE-HALF TABLET IN THE MORNING AND TAKE ONE TABLET IN THE EVENING 45 tablet 1  . multivitamin (THERAGRAN) per tablet Take 1 tablet by  mouth daily.     Marland Kitchen NITROSTAT 0.4 MG SL tablet ONE TABLET UNDER TONGUE AS NEEDED 25 tablet 3  . Omega-3 Fatty Acids (FISH OIL) 1000 MG CAPS Take 1,000 mg by mouth 2 (two) times daily.     Marland Kitchen omeprazole (PRILOSEC) 40 MG capsule Take 1 capsule (40 mg total) by mouth daily before lunch. 30 capsule 0  . potassium chloride SA (K-DUR,KLOR-CON) 20 MEQ tablet TAKE ONE TABLET EVERY DAY 30 tablet 6  . simvastatin (ZOCOR) 20 MG tablet Take 10 mg by mouth daily.    . simvastatin (ZOCOR) 20 MG tablet  TAKE ONE-HALF TABLET DAILY (Patient not taking: Reported on 05/13/2014) 30 tablet 3  . SYNTHROID 88 MCG tablet TAKE ONE TABLET EVERY DAY 30 tablet 3  . TRIBENZOR 40-5-12.5 MG TABS TAKE ONE TABLET EVERY DAY 30 tablet 6  . zolpidem (AMBIEN CR) 12.5 MG CR tablet TAKE ONE TABLET AT BEDTIME AS NEEDED (Patient taking differently: Take one tablet at bedtime as needed for sleep.) 30 tablet 5   No current facility-administered medications for this visit.    OBJECTIVE: Elderly white woman in no acute distress Filed Vitals:   05/18/14 1030  BP: 151/69  Pulse: 52  Temp: 98.1 F (36.7 C)  Resp: 18     Body mass index is 24.08 kg/(m^2).    ECOG FS: 1  Sclerae unicteric Oropharynx clear  No cervical or supraclavicular adenopathy Lungs no rales or rhonchi Heart irregular rate, valvular "click" audible, unchanged from prior Abd soft, nontender, positive bowel sounds MSK no focal spinal tenderness, no peripheral edema Neuro: nonfocal, well oriented, friendly affect Breasts: Deferred   LAB RESULTS: Lab Results  Component Value Date   WBC 5.1 05/17/2014   NEUTROABS 4.1 05/17/2014   HGB 11.4* 05/17/2014   HCT 35.4 05/17/2014   MCV 98.3 05/17/2014   PLT 175 05/17/2014      Chemistry      Component Value Date/Time   NA 132* 05/14/2014 1039   NA 132* 04/19/2014 1025   K 3.4* 05/14/2014 1039   K 4.4 04/19/2014 1025   CL 95* 05/14/2014 1039   CO2 29 05/14/2014 1039   CO2 28 04/19/2014 1025   BUN 12  05/14/2014 1039   BUN 21.9 04/19/2014 1025   CREATININE 0.77 05/14/2014 1039   CREATININE 0.7 04/19/2014 1025      Component Value Date/Time   CALCIUM 9.0 05/14/2014 1039   CALCIUM 9.6 04/19/2014 1025   ALKPHOS 48 05/13/2014 1223   ALKPHOS 64 04/19/2014 1025   AST 44* 05/13/2014 1223   AST 19 04/19/2014 1025   ALT 32 05/13/2014 1223   ALT 20 04/19/2014 1025   BILITOT 0.8 05/13/2014 1223   BILITOT 0.30 04/19/2014 1025      No results found for: LABCA2  No components found for: LABCA125   Recent Labs Lab 05/17/14 1122  INR 1.1    Urinalysis    Component Value Date/Time   COLORURINE YELLOW 05/13/2014 [place] 05/13/2014 1126   LABSPEC 1.005 05/13/2014 1126   LABSPEC 1.010 02/08/2014 1128   PHURINE 7.5 05/13/2014 [place] 05/13/2014 1126   GLUCOSEU Negative 02/08/2014 [place] 04/07/2013 [place] 05/13/2014 1126   BILIRUBINUR NEGATIVE 05/13/2014 1126   KETONESUR NEGATIVE 05/13/2014 [place] 05/13/2014 1126   UROBILINOGEN 0.2 05/13/2014 1126   UROBILINOGEN 0.2 02/08/2014 1128   NITRITE NEGATIVE 05/13/2014 [place] 05/13/2014 1126  Results for LESSA, HUGE (MRN [id number]) as of 05/18/2014 18:12  Ref. Range 12/28/2013 10:48 02/21/2014 11:23 03/16/2014 10:32 04/19/2014 10:36 05/14/2014 10:39  Ferritin Latest Range: 10-291 ng/mL 346 (H) 41 300 (H) 52 285  Results for NOMA, QUIJAS (MRN [id number]) as of 05/18/2014 18:12  Ref. Range 02/22/2013 10:42 03/16/2013 13:11 04/14/2013 09:52 06/01/2013 13:57 06/15/2013 11:07  Retic % Latest Range: 0.70-2.10 % 2.00 2.30 (H) 2.60 (H) 12.41 (H) 3.31 (H)   STUDIES: No results found.    ASSESSMENT: 81 y.o. [place] with iron deficiency anemia secondary to chronic blood loss, s/p oral  iron supplementation with poor tolerance and inadequate response  (1) Feraheme started 07/24/2012, repeated whenever ferritin drops <100, mosr recent  dose 06/30/2013  (2) transfusing PBBCs for Hb < 9.0 or symptoms  (3) chronic GIB extensively evaluated by GI with findings including AVM (cauterized), diverticular disease, hemorrhoids and gastric polyps  (4) on lifelong anticoagulation for AFib and mechanical aortic valve  PLAN: Clarise Cruz likes the idea of switching to Lovenox, which would involve no monitoring. We could also switched to Rivaroxaban or other of the newer agents but these are fixed dose agents with very little flexibility in terms of dose adjustment (that is precisely their benefit) and there is some data that in very elderly patients there is an be a significant increase in dangerous bleeding episodes. By contrast the Lovenox.dose can be adjusted so that Deborah [name] is appropriately anticoagulated but only just, to minimize the risk of continuing GI bleeding. The patient understands that, unlike with Coumadin, we have no reversing agent for Lovenox.  The patient is interested in Dr. Sherryl Barters input and I have sent him a note asking for his opinion.  In any case she will need a port placed andthe earliest we can do that is 05/24/2014. It would make little sense to coumadinize her now just to reverse that later.accordingly she will stay on Lovenox 85 mg daily for the next several days. On the morning of the procedure she will only receive 40 mg in the morning and she will receive the rest 4 hours after the procedure.   She understands the possible risks and complications involved and port placement, but is very interested in proceeding because she has had so many venipunctures recently.  Otherwise the plan is to continue to support her bone marrow function. She does not need erythropoietin since she has a good reticulocyte count. Her most recent ferritin was over 200, so she currently does not need iron supplementation. We will continue to follow her labwork every 2 weeks, supplementing her are and transfusing as needed.  She has a good  understanding of the overall plan. She agrees with it. She knows the goal of treatment in her case is long-term control. She will call with any problems that may develop before her next visit here.     Lazer Wollard C    05/18/2014

## 2014-05-18 NOTE — Telephone Encounter (Signed)
New Message        Deborah [name]'s daughter calling stating that Deborah [name] is scheduled to have a porter cath at [facility] on 05/24/14 at 12:30 and Deborah [name] is wanting some guidance in regards to this from Dr. Mare Ferrari. Please call Deborah [name] back and advise.

## 2014-05-18 NOTE — Telephone Encounter (Signed)
F/U        Pt calling back.      Please return call.

## 2014-05-19 ENCOUNTER — Telehealth: Payer: Self-pay | Admitting: Oncology

## 2014-05-19 MED ORDER — HYDROCHLOROTHIAZIDE 12.5 MG PO CAPS
12.5000 mg | ORAL_CAPSULE | Freq: Every day | ORAL | Status: DC
Start: 2014-05-19 — End: 2014-09-21

## 2014-05-19 MED ORDER — AMLODIPINE BESYLATE 5 MG PO TABS: 5.0000 mg | ORAL_TABLET | Freq: Every day | ORAL | Status: DC

## 2014-05-19 MED ORDER — DIGOXIN 125 MCG PO TABS: 125.0000 ug | ORAL_TABLET | Freq: Every day | ORAL | Status: DC

## 2014-05-19 MED ORDER — ENOXAPARIN SODIUM 100 MG/ML ~~LOC~~ SOLN: 1.5000 mg/kg | SUBCUTANEOUS | Status: DC

## 2014-05-19 MED ORDER — LOSARTAN POTASSIUM 25 MG PO TABS: 25.0000 mg | ORAL_TABLET | Freq: Every day | ORAL | Status: DC

## 2014-05-19 NOTE — Telephone Encounter (Signed)
F/U      Pt calling back. States she will need a call back before 5pm today.    Please return pt call.

## 2014-05-19 NOTE — Telephone Encounter (Signed)
Discussed long term Lovenox therapy with  Dr. Mare Ferrari and he agrees and has responded to Dr Starleen Arms message Patient aware  Unable to get Tribenzor approved Discussed with  Dr. Mare Ferrari and will change to HCTZ 12.5 mg daily, Amlodipine 5 mg daily, and Losartan 25 mg daily Rx sent to [facility] Patient aware

## 2014-05-19 NOTE — Telephone Encounter (Signed)
per Alphonzo Grieve to sch CC-pt aware

## 2014-05-20 ENCOUNTER — Other Ambulatory Visit: Payer: BLUE CROSS/BLUE SHIELD

## 2014-05-20 ENCOUNTER — Other Ambulatory Visit: Payer: Self-pay | Admitting: Radiology

## 2014-05-20 ENCOUNTER — Telehealth (HOSPITAL_COMMUNITY): Payer: Self-pay | Admitting: Radiology

## 2014-05-20 ENCOUNTER — Telehealth: Payer: Self-pay | Admitting: *Deleted

## 2014-05-20 ENCOUNTER — Ambulatory Visit: Payer: BLUE CROSS/BLUE SHIELD

## 2014-05-20 ENCOUNTER — Other Ambulatory Visit: Payer: Self-pay | Admitting: *Deleted

## 2014-05-20 ENCOUNTER — Telehealth: Payer: Self-pay | Admitting: Oncology

## 2014-05-20 NOTE — Telephone Encounter (Signed)
none

## 2014-05-20 NOTE — Telephone Encounter (Signed)
Patient called for pre-procedure instructions for portacath placement scheduled on 05-24-14.  Patient instructed to be npo after 0800, arrive in Radiology at [facility] at 1230, and have a driver available after the procedure.  Patient asked about blood thinner instructions, and I told patient that we would check into these instructions and call her back.

## 2014-05-20 NOTE — Telephone Encounter (Signed)
per Gerald Stabs in [place] to [place] aware

## 2014-05-20 NOTE — Telephone Encounter (Signed)
This RN reviewed with MD dosing of lovenox for upcoming port placement.  Per MD recommendation is for pt to continue full dose thru Monday 2/22-  She will then on Tuesday am ( 8 am ) of port placement take 1/2 dose of lovenox and then repeat the 1/2 dose at 8pm. She will resume the full dose once a day on Wednesday.  Pt is also asking about " when do I come back in for labs and such "  This RN will follow up with MD and speak with pt on Monday for above concerns and understanding of dosing of lovenox.

## 2014-05-23 ENCOUNTER — Telehealth: Payer: Self-pay | Admitting: *Deleted

## 2014-05-23 ENCOUNTER — Other Ambulatory Visit: Payer: Self-pay | Admitting: Radiology

## 2014-05-23 NOTE — Telephone Encounter (Signed)
This RN spoke with pt per MD request to verify understanding of dosing of lovenox per procedure tomorrow.  Deborah [name] states she took her dose today at 106am.  Per MD pt is to not take a dose tomorrow ( day of procedure) until post procedure at approximately 530 pm.  She is to then resume her usual time and dose in the am on Wed.  Deborah [name] was able to read back the above instructions.  Per discussion Deborah [name] did state concern that she is having " black tarry stools again "  She would like to come in later this week for a lab and speak with the coumadin clinic as well.  " I worry that I may not remember to give myself these shots and I have been thinking that I would like to go back on the coumadin "  Appointment will be made for Thursday for lab and coumadin clinic.  No other needs at this time.

## 2014-05-24 ENCOUNTER — Encounter (HOSPITAL_COMMUNITY): Payer: Self-pay

## 2014-05-24 ENCOUNTER — Other Ambulatory Visit: Payer: Self-pay | Admitting: Oncology

## 2014-05-24 ENCOUNTER — Ambulatory Visit (HOSPITAL_COMMUNITY)
Admission: RE | Admit: 2014-05-24 | Discharge: 2014-05-24 | Disposition: A | Discharge status: Home / Self Care | Payer: Medicare Other | Source: Ambulatory Visit | Attending: Oncology | Admitting: Oncology

## 2014-05-24 ENCOUNTER — Ambulatory Visit (HOSPITAL_COMMUNITY)
Admission: RE | Admit: 2014-05-24 | Discharge: 2014-05-24 | Disposition: A | Discharge status: Home / Self Care | Payer: Medicare Other | Source: Ambulatory Visit | Attending: Diagnostic Radiology | Admitting: Diagnostic Radiology

## 2014-05-24 DIAGNOSIS — M109 Gout, unspecified: Secondary | ICD-10-CM | POA: Insufficient documentation

## 2014-05-24 DIAGNOSIS — I4891 Unspecified atrial fibrillation: Secondary | ICD-10-CM | POA: Diagnosis not present

## 2014-05-24 DIAGNOSIS — Z952 Presence of prosthetic heart valve: Secondary | ICD-10-CM | POA: Diagnosis not present

## 2014-05-24 DIAGNOSIS — Z9221 Personal history of antineoplastic chemotherapy: Secondary | ICD-10-CM | POA: Insufficient documentation

## 2014-05-24 DIAGNOSIS — I4819 Other persistent atrial fibrillation: Secondary | ICD-10-CM

## 2014-05-24 DIAGNOSIS — Z853 Personal history of malignant neoplasm of breast: Secondary | ICD-10-CM | POA: Diagnosis not present

## 2014-05-24 DIAGNOSIS — E780 Pure hypercholesterolemia: Secondary | ICD-10-CM | POA: Diagnosis not present

## 2014-05-24 DIAGNOSIS — K31811 Angiodysplasia of stomach and duodenum with bleeding: Secondary | ICD-10-CM

## 2014-05-24 DIAGNOSIS — Z87891 Personal history of nicotine dependence: Secondary | ICD-10-CM | POA: Diagnosis not present

## 2014-05-24 DIAGNOSIS — D500 Iron deficiency anemia secondary to blood loss (chronic): Secondary | ICD-10-CM

## 2014-05-24 DIAGNOSIS — I878 Other specified disorders of veins: Secondary | ICD-10-CM | POA: Insufficient documentation

## 2014-05-24 DIAGNOSIS — Z9011 Acquired absence of right breast and nipple: Secondary | ICD-10-CM | POA: Insufficient documentation

## 2014-05-24 DIAGNOSIS — D689 Coagulation defect, unspecified: Secondary | ICD-10-CM

## 2014-05-24 DIAGNOSIS — T45515A Adverse effect of anticoagulants, initial encounter: Secondary | ICD-10-CM | POA: Diagnosis not present

## 2014-05-24 DIAGNOSIS — K219 Gastro-esophageal reflux disease without esophagitis: Secondary | ICD-10-CM | POA: Diagnosis not present

## 2014-05-24 DIAGNOSIS — Z923 Personal history of irradiation: Secondary | ICD-10-CM | POA: Diagnosis not present

## 2014-05-24 DIAGNOSIS — Z954 Presence of other heart-valve replacement: Secondary | ICD-10-CM

## 2014-05-24 DIAGNOSIS — Z7901 Long term (current) use of anticoagulants: Secondary | ICD-10-CM

## 2014-05-24 DIAGNOSIS — E039 Hypothyroidism, unspecified: Secondary | ICD-10-CM | POA: Insufficient documentation

## 2014-05-24 LAB — CBC
HCT: 30.2 % — ABNORMAL LOW (ref 36.0–46.0)
Hemoglobin: 9.7 g/dL — ABNORMAL LOW (ref 12.0–15.0)
MCH: 32.4 pg (ref 26.0–34.0)
MCHC: 32.1 g/dL (ref 30.0–36.0)
MCV: 101 fL — ABNORMAL HIGH (ref 78.0–100.0)
PLATELETS: 211 10*3/uL (ref 150–400)
RBC: 2.99 MIL/uL — AB (ref 3.87–5.11)
RDW: 20.4 % — AB (ref 11.5–15.5)
WBC: 3.9 10*3/uL — ABNORMAL LOW (ref 4.0–10.5)

## 2014-05-24 LAB — BASIC METABOLIC PANEL
ANION GAP: 4 — AB (ref 5–15)
BUN: 36 mg/dL — ABNORMAL HIGH (ref 6–23)
CHLORIDE: 103 mmol/L (ref 96–112)
CO2: 27 mmol/L (ref 19–32)
Calcium: 10.3 mg/dL (ref 8.4–10.5)
Creatinine, Ser: 0.74 mg/dL (ref 0.50–1.10)
GFR calc non Af Amer: 75 mL/min — ABNORMAL LOW (ref >90)
GFR, EST AFRICAN AMERICAN: 87 mL/min — AB (ref >90)
Glucose, Bld: 98 mg/dL (ref 70–99)
Potassium: 4.4 mmol/L (ref 3.5–5.1)
SODIUM: 134 mmol/L — AB (ref 135–145)

## 2014-05-24 LAB — APTT: APTT: 45 s — AB (ref 24–37)

## 2014-05-24 LAB — PROTIME-INR
INR: 0.93 (ref 0.00–1.49)
Prothrombin Time: 12.5 seconds (ref 11.6–15.2)

## 2014-05-24 MED ORDER — SODIUM CHLORIDE 0.9 % IV SOLN
Freq: Once | INTRAVENOUS | Status: AC
  Administered 2014-05-24: 500 mL via INTRAVENOUS

## 2014-05-24 MED ORDER — LIDOCAINE-EPINEPHRINE 2 %-1:100000 IJ SOLN
INTRAMUSCULAR | Status: DC
Start: 2014-05-24 — End: 2014-05-25
  Filled 2014-05-24: qty 1

## 2014-05-24 MED ORDER — HEPARIN SOD (PORK) LOCK FLUSH 100 UNIT/ML IV SOLN
INTRAVENOUS | Status: AC
  Filled 2014-05-24: qty 5

## 2014-05-24 MED ORDER — MIDAZOLAM HCL 2 MG/2ML IJ SOLN
INTRAMUSCULAR | Status: AC
  Filled 2014-05-24: qty 6

## 2014-05-24 MED ORDER — FENTANYL CITRATE 0.05 MG/ML IJ SOLN
INTRAMUSCULAR | Status: AC
  Filled 2014-05-24: qty 4

## 2014-05-24 MED ORDER — FENTANYL CITRATE 0.05 MG/ML IJ SOLN
INTRAMUSCULAR | Status: AC | PRN
  Administered 2014-05-24 (×2): 25 ug via INTRAVENOUS

## 2014-05-24 MED ORDER — MIDAZOLAM HCL 2 MG/2ML IJ SOLN
INTRAMUSCULAR | Status: AC | PRN
  Administered 2014-05-24: 0.5 mg via INTRAVENOUS
  Administered 2014-05-24: 1 mg via INTRAVENOUS

## 2014-05-24 MED ORDER — IOHEXOL 300 MG/ML  SOLN
20.0000 mL | Freq: Once | INTRAMUSCULAR | Status: AC | PRN
  Administered 2014-05-24: 30 mL

## 2014-05-24 MED ORDER — CEFAZOLIN SODIUM-DEXTROSE 2-3 GM-% IV SOLR
2.0000 g | Freq: Once | INTRAVENOUS | Status: AC
  Administered 2014-05-24: 2 g via INTRAVENOUS

## 2014-05-24 MED ORDER — CEFAZOLIN SODIUM-DEXTROSE 2-3 GM-% IV SOLR
INTRAVENOUS | Status: AC
  Filled 2014-05-24: qty 50

## 2014-05-24 MED ORDER — LIDOCAINE HCL 1 % IJ SOLN
INTRAMUSCULAR | Status: DC
Start: 2014-05-24 — End: 2014-05-25
  Filled 2014-05-24: qty 20

## 2014-05-24 MED ORDER — HEPARIN SOD (PORK) LOCK FLUSH 100 UNIT/ML IV SOLN
INTRAVENOUS | Status: AC | PRN
  Administered 2014-05-24: 500 [IU]

## 2014-05-24 NOTE — Procedures (Signed)
Post-Procedure Note  Pre-operative Diagnosis: Poor venous access and needs frequent blood transfusions       Post-operative Diagnosis: Poor venous access and Left-sided SVC   Indications: Needs central venous access  Procedure Details:   Placement of left internal jugular Portacath.  Wire went down the left side of the heart.  Venogram performed.  Port tip placed in lower left-sided SVC.  Findings: Left SVC.  Tip in lower SVC  Complications: None     Condition: Stable  Plan: Recover in Short Stay and discharge home.

## 2014-05-24 NOTE — Discharge Instructions (Signed)
Conscious Sedation Sedation is the use of medicines to promote relaxation and relieve discomfort and anxiety. Conscious sedation is a type of sedation. Under conscious sedation you are less alert than normal but are still able to respond to instructions or stimulation. Conscious sedation is used during short medical and dental procedures. It is milder than deep sedation or general anesthesia and allows you to return to your regular activities sooner.  LET [facility] CARE PROVIDER KNOW ABOUT:   Any allergies you have.  All medicines you are taking, including vitamins, herbs, eye drops, creams, and over-the-counter medicines.  Use of steroids (by mouth or creams).  Previous problems you or members of your family have had with the use of anesthetics.  Any blood disorders you have.  Previous surgeries you have had.  Medical conditions you have.  Possibility of pregnancy, if this applies.  Use of cigarettes, alcohol, or illegal drugs. RISKS AND COMPLICATIONS Generally, this is a safe procedure. However, as with any procedure, problems can occur. Possible problems include:  Oversedation.  Trouble breathing on your own. You may need to have a breathing tube until you are awake and breathing on your own.  Allergic reaction to any of the medicines used for the procedure. BEFORE THE PROCEDURE  You may have blood tests done. These tests can help show how well your kidneys and liver are working. They can also show how well your blood clots.  A physical exam will be done.  Only take medicines as directed by your health care provider. You may need to stop taking medicines (such as blood thinners, aspirin, or nonsteroidal anti-inflammatory drugs) before the procedure.   Do not eat or drink at least 6 hours before the procedure or as directed by your health care provider.  Arrange for a responsible adult, family member, or friend to take you home after the procedure. He or she should stay  with you for at least 24 hours after the procedure, until the medicine has worn off. PROCEDURE   An intravenous (IV) catheter will be inserted into one of your veins. Medicine will be able to flow directly into your body through this catheter. You may be given medicine through this tube to help prevent pain and help you relax.  The medical or dental procedure will be done. AFTER THE PROCEDURE  You will stay in a recovery area until the medicine has worn off. Your blood pressure and pulse will be checked.   Depending on the procedure you had, you may be allowed to go home when you can tolerate liquids and your pain is under control. Document Released: 12/11/2000 Document Revised: 03/23/2013 Document Reviewed: 11/23/2012 [facility] Patient Information 2015 [place], [place]. This information is not intended to replace advice given to you by your health care provider. Make sure you discuss any questions you have with your health care provider. Implanted Port Insertion, Care After Refer to this sheet in the next few weeks. These instructions provide you with information on caring for yourself after your procedure. Your health care provider may also give you more specific instructions. Your treatment has been planned according to current medical practices, but problems sometimes occur. Call your health care provider if you have any problems or questions after your procedure. WHAT TO EXPECT AFTER THE PROCEDURE After your procedure, it is typical to have the following:   Discomfort at the port insertion site. Ice packs to the area will help.  Bruising on the skin over the port. This will subside  in 3-4 days. HOME CARE INSTRUCTIONS  After your port is placed, you will get a manufacturer's information card. The card has information about your port. Keep this card with you at all times.   Know what kind of port you have. There are many types of ports available.   Wear a medical alert bracelet in  case of an emergency. This can help alert health care workers that you have a port.   The port can stay in for as long as your health care provider believes it is necessary.   A home health care nurse may give medicines and take care of the port.   You or a family member can get special training and directions for giving medicine and taking care of the port at home.  SEEK MEDICAL CARE IF:   Your port does not flush or you are unable to get a blood return.   You have a fever or chills. SEEK IMMEDIATE MEDICAL CARE IF:  You have new fluid or pus coming from your incision.   You notice a bad smell coming from your incision site.   You have swelling, pain, or more redness at the incision or port site.   You have chest pain or shortness of breath. Document Released: 01/06/2013 Document Revised: 03/23/2013 Document Reviewed: 01/06/2013 [facility] Patient Information 2015 [place], [place]. This information is not intended to replace advice given to you by your health care provider. Make sure you discuss any questions you have with your health care provider. Implanted [facility] Guide An implanted port is a type of central line that is placed under the skin. Central lines are used to provide IV access when treatment or nutrition needs to be given through a person's veins. Implanted ports are used for long-term IV access. An implanted port may be placed because:   You need IV medicine that would be irritating to the small veins in your hands or arms.   You need long-term IV medicines, such as antibiotics.   You need IV nutrition for a long period.   You need frequent blood draws for lab tests.   You need dialysis.  Implanted ports are usually placed in the chest area, but they can also be placed in the upper arm, the abdomen, or the leg. An implanted port has two main parts:   Reservoir. The reservoir is round and will appear as a small, raised area under your skin. The reservoir  is the part where a needle is inserted to give medicines or draw blood.   Catheter. The catheter is a thin, flexible tube that extends from the reservoir. The catheter is placed into a large vein. Medicine that is inserted into the reservoir goes into the catheter and then into the vein.  HOW WILL I CARE FOR MY INCISION SITE? Do not get the incision site wet. Bathe or shower as directed by your health care provider.  HOW IS MY PORT ACCESSED? Special steps must be taken to access the port:   Before the port is accessed, a numbing cream can be placed on the skin. This helps numb the skin over the port site.   Your health care provider uses a sterile technique to access the port.  Your health care provider must put on a mask and sterile gloves.  The skin over your port is cleaned carefully with an antiseptic and allowed to dry.  The port is gently pinched between sterile gloves, and a needle is inserted into the port.  Only "non-coring" port needles should be used to access the port. Once the port is accessed, a blood return should be checked. This helps ensure that the port is in the vein and is not clogged.   If your port needs to remain accessed for a constant infusion, a clear (transparent) bandage will be placed over the needle site. The bandage and needle will need to be changed every week, or as directed by your health care provider.   Keep the bandage covering the needle clean and dry. Do not get it wet. Follow your health care provider's instructions on how to take a shower or bath while the port is accessed.   If your port does not need to stay accessed, no bandage is needed over the port.  WHAT IS FLUSHING? Flushing helps keep the port from getting clogged. Follow your health care provider's instructions on how and when to flush the port. Ports are usually flushed with saline solution or a medicine called heparin. The need for flushing will depend on how the port is used.    If the port is used for intermittent medicines or blood draws, the port will need to be flushed:   After medicines have been given.   After blood has been drawn.   As part of routine maintenance.   If a constant infusion is running, the port may not need to be flushed.  HOW LONG WILL MY PORT STAY IMPLANTED? The port can stay in for as long as your health care provider thinks it is needed. When it is time for the port to come out, surgery will be done to remove it. The procedure is similar to the one performed when the port was put in.  WHEN SHOULD I SEEK IMMEDIATE MEDICAL CARE? When you have an implanted port, you should seek immediate medical care if:   You notice a bad smell coming from the incision site.   You have swelling, redness, or drainage at the incision site.   You have more swelling or pain at the port site or the surrounding area.   You have a fever that is not controlled with medicine. Document Released: 03/18/2005 Document Revised: 01/06/2013 Document Reviewed: 11/23/2012 [facility] Patient Information 2015 [place], [place]. This information is not intended to replace advice given to you by your health care provider. Make sure you discuss any questions you have with your health care provider.

## 2014-05-24 NOTE — H&P (Signed)
Chief Complaint: "I am here for a port."  Referring Physician(s): Magrinat,Gustav C  History of Present Illness: Deborah [name] is a 81 y.o. female with history of anemia from chronic blood loss secondary to long term anticoagulation for Afib and mechanical heart valve. The patient c/o ongoing tarry stools and has been seen by GI. She denies any active CP, shortness of breath, palpitations, lightheadedness or dizziness. She has been evaluated by Dr. Jana Hakim and is referred today for a port a catheter for poor venous access for transfusions. She denies any recent fever or chills. The patient denies any history of sleep apnea or chronic oxygen use. She has previously tolerated sedation, however does complain during her colonoscopy the sedation had the opposite effect and she was unable to sleep for 24 hours post procedure.    Past Medical History  Diagnosis Date  . Hypothyroidism   . Stomach problems     seeing Dr Watt Climes  . Hypertension   . GERD (gastroesophageal reflux disease)   . Anemia   . Arrhythmia     Atrial Fibrillation  . Breast cancer     "right; S/P lumpectomy, chemo, XRT"   . Complication of anesthesia     "once I'm awakened, I don't sleep til the following night"  . High cholesterol   . Heart murmur   . History of blood transfusion 1996; ~ 2013    "related to valve replacement; GI bleeding"   . Chronic lower GI bleeding   . Arthritis     "fingers, toes, hips; qwhere" (05/19/2013)  . Gout   . Pneumonia 07/2011    Past Surgical History  Procedure Laterality Date  . Arteriogram  01/12    NO RAS   . Cardioversion      X 2  . Cardiac catheterization    . Cataract extraction w/ intraocular lens  implant, bilateral Bilateral     bilateral cataract with lens implants  . Cholecystectomy    . Abdominal hysterectomy    . Appendectomy    . Colonoscopy with propofol N/A 11/24/2012    Procedure: COLONOSCOPY WITH PROPOFOL;  Surgeon: Jeryl Columbia, MD;  Location: [facility]  ENDOSCOPY;  Service: Endoscopy;  Laterality: N/A;  . Breast biopsy Right   . Breast lumpectomy Right   . Aortic valve replacement  1996    [employer]. Jude  . Cardiac valve replacement  1996    Allergies: Amiodarone; Alendronate sodium; Macrodantin; Meclizine; Norvasc; and Tussionex pennkinetic er  Medications: Prior to Admission medications   Medication Sig Start Date End Date Taking? Authorizing Provider  acetaminophen (TYLENOL) 500 MG tablet Take 1,000 mg by mouth every 6 (six) hours as needed for headache.   Yes Historical Provider, MD  allopurinol (ZYLOPRIM) 300 MG tablet TAKE ONE TABLET EVERY DAY 05/09/14  Yes Darlin Coco, MD  amLODipine (NORVASC) 5 MG tablet Take 1 tablet (5 mg total) by mouth daily. 05/19/14  Yes Darlin Coco, MD  calcium-vitamin D 500 MG tablet Take 1 tablet by mouth 2 (two) times daily.     Yes Historical Provider, MD  dicyclomine (BENTYL) 10 MG capsule Take 10 mg by mouth daily as needed for spasms.    Yes Historical Provider, MD  digoxin (LANOXIN) 0.125 MG tablet Take 1 tablet (125 mcg total) by mouth daily. 05/19/14  Yes Darlin Coco, MD  enoxaparin (LOVENOX) 100 MG/ML injection Inject 0.85 mLs (85 mg total) into the skin daily. 05/19/14  Yes Darlin Coco, MD  fexofenadine [facility])  180 MG tablet Take 180 mg by mouth daily as needed for allergies.    Yes Historical Provider, MD  furosemide (LASIX) 40 MG tablet TAKE ONE TABLET EACH DAY Patient taking differently: Take one tablet daily as needed for fluid. 11/16/13  Yes Darlin Coco, MD  hydrochlorothiazide (MICROZIDE) 12.5 MG capsule Take 1 capsule (12.5 mg total) by mouth daily. 05/19/14  Yes Darlin Coco, MD  losartan (COZAAR) 25 MG tablet Take 1 tablet (25 mg total) by mouth daily. 05/19/14  Yes Darlin Coco, MD  metoprolol tartrate (LOPRESSOR) 25 MG tablet TAKE ONE-HALF TABLET IN THE MORNING AND TAKE ONE TABLET IN THE EVENING 05/09/14  Yes Darlin Coco, MD  multivitamin [facility])  per tablet Take 1 tablet by mouth daily.    Yes Historical Provider, MD  Omega-3 Fatty Acids (FISH OIL) 1000 MG CAPS Take 1,000 mg by mouth 2 (two) times daily.    Yes Historical Provider, MD  potassium chloride SA (K-DUR,KLOR-CON) 20 MEQ tablet TAKE ONE TABLET EVERY DAY 02/08/14  Yes Darlin Coco, MD  simvastatin (ZOCOR) 20 MG tablet Take 10 mg by mouth daily.   Yes Historical Provider, MD  zolpidem (AMBIEN CR) 12.5 MG CR tablet TAKE ONE TABLET AT BEDTIME AS NEEDED Patient taking differently: Take one tablet at bedtime as needed for sleep. 02/08/14  Yes Darlin Coco, MD  COUMADIN 5 MG tablet TAKE ONE TABLET EACH DAY OR TAKE AS DIRECTED BY COUMADIN CLINIC Patient taking differently: TAKE half of a tablet daily as directed by coumadin clinic. 10/20/13   [name], MD  NITROSTAT 0.4 MG SL tablet ONE TABLET UNDER TONGUE AS NEEDED 05/09/14   Darlin Coco, MD  simvastatin (ZOCOR) 20 MG tablet TAKE ONE-HALF TABLET DAILY Patient not taking: Reported on 05/13/2014    Darlin Coco, MD  SYNTHROID 60 MCG tablet TAKE ONE TABLET EVERY DAY 05/09/14   Darlin Coco, MD     Family History  Problem Relation Age of Onset  . Dementia Mother   . Coronary artery disease Father     History   Social History  . Marital Status: Widowed    Spouse Name: N/A  . Number of Children: N/A  . Years of Education: N/A   Social History Main Topics  . Smoking status: Former Smoker -- 1.00 packs/day for 20 years    Types: Cigarettes    Quit date: 04/01/1988  . Smokeless tobacco: Never Used  . Alcohol Use: 4.8 oz/week    4 Glasses of wine, 4 Shots of liquor per week     Comment: burbon or glass wine daily  . Drug Use: No  . Sexual Activity: No   Other Topics Concern  . None   Social History Narrative   Review of Systems: A 12 point ROS discussed and pertinent positives are indicated in the HPI above.  All other systems are negative.  Review of Systems  Vital Signs: BP 151/54 mmHg   Pulse 87  Temp(Src) 97.1 F (36.2 C) (Oral)  Resp 18  Ht 5' (1.524 m)  Wt 123 lb (55.792 kg)  BMI 24.02 kg/m2  SpO2 100%  Physical Exam  Constitutional: She is oriented to person, place, and time. No distress.  HENT:  Head: Normocephalic and atraumatic.  Neck: No tracheal deviation present.  Cardiovascular: Normal rate and regular rhythm.  Exam reveals no gallop and no friction rub.   Murmur heard. Pulmonary/Chest: Effort normal and breath sounds normal. No respiratory distress. She has no wheezes. She has no rales.  Abdominal: Soft. Bowel sounds are normal. She exhibits no distension. There is no tenderness.  Neurological: She is alert and oriented to person, place, and time.  Skin: She is not diaphoretic.  Psychiatric: She has a normal mood and affect. Her behavior is normal. Thought content normal.   Mallampati Score:  MD Evaluation Airway: WNL Heart: WNL Abdomen: WNL Chest/ Lungs: WNL ASA  Classification: 3 Mallampati/Airway Score: Two  Labs:  CBC:  Recent Labs  05/14/14 2028 05/15/14 0524 05/17/14 1049 05/24/14 1300  WBC 4.1 3.8* 5.1 3.9*  HGB 10.9* 10.8* 11.4* 9.7*  HCT 33.3* 33.8* 35.4 30.2*  PLT 171 164 175 211    COAGS:  Recent Labs  05/13/14 1223 05/17/14 1049 05/17/14 1122 05/24/14 1300  INR 1.18 1.10* 1.1 0.93  APTT  --   --   --  45*    BMP:  Recent Labs  05/12/14 0936 05/13/14 1223 05/14/14 1039 05/24/14 1300  NA 132* 128* 132* 134*  K 4.2 4.6 3.4* 4.4  CL 98 90* 95* 103  CO2 30 26 29 27   GLUCOSE 127* 105* 131* 98  BUN 20 17 12  36*  CALCIUM 9.4 9.5 9.0 10.3  CREATININE 0.69 0.74 0.77 0.74  GFRNONAA  --  75* 74* 75*  GFRAA  --  87* 86* 87*    LIVER FUNCTION TESTS:  Recent Labs  04/19/14 1025 05/12/14 0936 05/13/14 1223  BILITOT 0.30 0.4 0.8  AST 19 22 44*  ALT 20 22 32  ALKPHOS 64 44 48  PROT 6.0* 5.2* 6.2  ALBUMIN 3.6 3.4* 3.7   Assessment and Plan: Anemia from chronic GI blood loss requiring multiple  transfusions- Seen by GI and hematology  History of atrial fibrillation, mechanical aortic valve on chronic anticoagulation  Seen by Dr. Jana Hakim and scheduled today for image guided port a catheter placement for poor venous access Patient has been NPO, coumadin held and last Lovenox dose given 2/22 morning, labs reviewed, afebrile Risks and Benefits discussed with the patient. All of the patient's questions were answered, patient is agreeable to proceed. Consent signed and in chart. History of breast cancer s/p right lumpectomy and lymph node removal   Thank you for this interesting consult.  I greatly enjoyed meeting JENISIS HARMSEN and look forward to participating in their care.  SignedHedy Jacob 05/24/2014, 2:22 PM   I spent a total of 20 Minutes in face to face in clinical consultation, greater than 50% of which was counseling/coordinating care for port a catheter placement.

## 2014-05-25 ENCOUNTER — Other Ambulatory Visit: Payer: Self-pay | Admitting: *Deleted

## 2014-05-25 ENCOUNTER — Other Ambulatory Visit (HOSPITAL_BASED_OUTPATIENT_CLINIC_OR_DEPARTMENT_OTHER): Payer: Medicare Other

## 2014-05-25 ENCOUNTER — Ambulatory Visit (INDEPENDENT_AMBULATORY_CARE_PROVIDER_SITE_OTHER): Payer: Self-pay | Admitting: Pharmacist

## 2014-05-25 ENCOUNTER — Ambulatory Visit (HOSPITAL_BASED_OUTPATIENT_CLINIC_OR_DEPARTMENT_OTHER): Payer: Medicare Other

## 2014-05-25 ENCOUNTER — Telehealth: Payer: Self-pay | Admitting: *Deleted

## 2014-05-25 VITALS — BP 150/68 | HR 63 | Temp 98.8°F | Resp 18

## 2014-05-25 DIAGNOSIS — D500 Iron deficiency anemia secondary to blood loss (chronic): Secondary | ICD-10-CM

## 2014-05-25 DIAGNOSIS — I4891 Unspecified atrial fibrillation: Secondary | ICD-10-CM

## 2014-05-25 DIAGNOSIS — D689 Coagulation defect, unspecified: Secondary | ICD-10-CM

## 2014-05-25 DIAGNOSIS — D649 Anemia, unspecified: Secondary | ICD-10-CM

## 2014-05-25 DIAGNOSIS — Z7901 Long term (current) use of anticoagulants: Secondary | ICD-10-CM

## 2014-05-25 DIAGNOSIS — Z954 Presence of other heart-valve replacement: Secondary | ICD-10-CM

## 2014-05-25 LAB — CBC WITH DIFFERENTIAL/PLATELET
BASO%: 0.7 % (ref 0.0–2.0)
Basophils Absolute: 0 10*3/uL (ref 0.0–0.1)
EOS%: 0.5 % (ref 0.0–7.0)
Eosinophils Absolute: 0 10*3/uL (ref 0.0–0.5)
HEMATOCRIT: 28.7 % — AB (ref 34.8–46.6)
HEMOGLOBIN: 9 g/dL — AB (ref 11.6–15.9)
LYMPH#: 0.5 10*3/uL — AB (ref 0.9–3.3)
LYMPH%: 9.5 % — ABNORMAL LOW (ref 14.0–49.7)
MCH: 31.2 pg (ref 25.1–34.0)
MCHC: 31.2 g/dL — ABNORMAL LOW (ref 31.5–36.0)
MCV: 100 fL (ref 79.5–101.0)
MONO#: 0.3 10*3/uL (ref 0.1–0.9)
MONO%: 5.4 % (ref 0.0–14.0)
NEUT#: 4.6 10*3/uL (ref 1.5–6.5)
NEUT%: 83.9 % — AB (ref 38.4–76.8)
Platelets: 200 10*3/uL (ref 145–400)
RBC: 2.87 10*6/uL — ABNORMAL LOW (ref 3.70–5.45)
RDW: 21.9 % — ABNORMAL HIGH (ref 11.2–14.5)
WBC: 5.5 10*3/uL (ref 3.9–10.3)

## 2014-05-25 LAB — PREPARE RBC (CROSSMATCH)

## 2014-05-25 MED ORDER — DIPHENHYDRAMINE HCL 25 MG PO CAPS
ORAL_CAPSULE | ORAL | Status: AC
  Filled 2014-05-25: qty 1

## 2014-05-25 MED ORDER — ACETAMINOPHEN 325 MG PO TABS
650.0000 mg | ORAL_TABLET | Freq: Once | ORAL | Status: AC
  Administered 2014-05-25: 650 mg via ORAL

## 2014-05-25 MED ORDER — SODIUM CHLORIDE 0.9 % IV SOLN
250.0000 mL | Freq: Once | INTRAVENOUS | Status: AC
  Administered 2014-05-25: 250 mL via INTRAVENOUS

## 2014-05-25 MED ORDER — HEPARIN SOD (PORK) LOCK FLUSH 100 UNIT/ML IV SOLN
500.0000 [IU] | Freq: Every day | INTRAVENOUS | Status: AC | PRN
  Administered 2014-05-25: 500 [IU]
  Filled 2014-05-25: qty 5

## 2014-05-25 MED ORDER — DIPHENHYDRAMINE HCL 25 MG PO CAPS
25.0000 mg | ORAL_CAPSULE | Freq: Once | ORAL | Status: AC
  Administered 2014-05-25: 25 mg via ORAL

## 2014-05-25 MED ORDER — ACETAMINOPHEN 325 MG PO TABS
ORAL_TABLET | ORAL | Status: AC
  Filled 2014-05-25: qty 2

## 2014-05-25 MED ORDER — SODIUM CHLORIDE 0.9 % IJ SOLN
10.0000 mL | INTRAMUSCULAR | Status: AC | PRN
  Administered 2014-05-25: 10 mL
  Filled 2014-05-25: qty 10

## 2014-05-25 NOTE — Progress Notes (Signed)
Pt seen in infusion today with her son during her blood transfusion She has been off coumadin for almost a week, as was going to get a PAC and was planning to switch to lovenox injections only. She has decided with her arthritis and possibility of missing a shot each day that she would rather stay on coumadin. Spoke with Val and we both agreed to continue the lovenox for the 4 remaining shots and restart her coumadin at her previous dose. Coumadin 5mg  daily with 2.5 mg on Tue and Fri. Val stated that the patient will be needing weekly CBC to monitor H/H and need for transfusion.   Her INR can be monitored during these weekly appmts as needed.  We will see her back in coumadin clinic on Monday, 2.29.16

## 2014-05-25 NOTE — Telephone Encounter (Signed)
This RN contacted pt per procedure yesterday that was delayed per IR schedule.  Deborah [name] states she " got home around 6 pm " yesterday.  Today she states " I just don't feel good, had very dark stools last night and just feel weak "  Plan per discussion with treatment room and MD is for pt to come in today to receive blood.  Discussed above with pt who states she will come in as soon as possible,.

## 2014-05-25 NOTE — Progress Notes (Signed)
portacath deaccessed and bleeding from site.  Pressure held to area and bleeding stopped without problem. Gauze dressing applied and pt instructed to leave dressing intact until tomorrow morning and to call if she notices further bleeding from site. Pt and son verbalized understanding.  Pt discharged in no acute distress, ambulatory without problem with family member.

## 2014-05-25 NOTE — Patient Instructions (Signed)
Restart coumadin 5 mg daily and 2.5 mg on Tue and Fri.   Continue lovenox 85 mg daily for 4 more shots. Recheck INR on 05/30/14:  lab at 11:00am & Coumadin clinic at 11:15am.

## 2014-05-25 NOTE — Patient Instructions (Signed)

## 2014-05-26 ENCOUNTER — Other Ambulatory Visit: Payer: Medicare Other

## 2014-05-26 ENCOUNTER — Telehealth: Payer: Self-pay | Admitting: Oncology

## 2014-05-26 ENCOUNTER — Ambulatory Visit: Payer: BLUE CROSS/BLUE SHIELD

## 2014-05-26 LAB — TYPE AND SCREEN
ABO/RH(D): O POS
Antibody Screen: NEGATIVE
Unit division: 0
Unit division: 0

## 2014-05-26 NOTE — Telephone Encounter (Signed)
per Alphonzo Grieve to r/s CC-pt aware

## 2014-05-27 ENCOUNTER — Telehealth: Payer: Self-pay | Admitting: Oncology

## 2014-05-27 NOTE — Telephone Encounter (Signed)
pt left vm wanting to verify appt/CLD & SPOKE TO PT AND ADV OF TIME & DATE OF APPT

## 2014-05-30 ENCOUNTER — Other Ambulatory Visit: Payer: Self-pay | Admitting: *Deleted

## 2014-05-30 ENCOUNTER — Ambulatory Visit (HOSPITAL_BASED_OUTPATIENT_CLINIC_OR_DEPARTMENT_OTHER): Payer: BLUE CROSS/BLUE SHIELD | Admitting: Pharmacist

## 2014-05-30 ENCOUNTER — Other Ambulatory Visit (HOSPITAL_BASED_OUTPATIENT_CLINIC_OR_DEPARTMENT_OTHER): Payer: Medicare Other

## 2014-05-30 ENCOUNTER — Encounter (HOSPITAL_COMMUNITY): Payer: Medicare Other

## 2014-05-30 DIAGNOSIS — D689 Coagulation defect, unspecified: Secondary | ICD-10-CM

## 2014-05-30 DIAGNOSIS — Z7901 Long term (current) use of anticoagulants: Secondary | ICD-10-CM

## 2014-05-30 DIAGNOSIS — D500 Iron deficiency anemia secondary to blood loss (chronic): Secondary | ICD-10-CM | POA: Diagnosis not present

## 2014-05-30 DIAGNOSIS — Z954 Presence of other heart-valve replacement: Secondary | ICD-10-CM

## 2014-05-30 DIAGNOSIS — D649 Anemia, unspecified: Secondary | ICD-10-CM

## 2014-05-30 LAB — CBC WITH DIFFERENTIAL/PLATELET
BASO%: 0.3 % (ref 0.0–2.0)
Basophils Absolute: 0 10*3/uL (ref 0.0–0.1)
EOS ABS: 0 10*3/uL (ref 0.0–0.5)
EOS%: 0.8 % (ref 0.0–7.0)
HCT: 28.2 % — ABNORMAL LOW (ref 34.8–46.6)
HGB: 9.1 g/dL — ABNORMAL LOW (ref 11.6–15.9)
LYMPH#: 0.5 10*3/uL — AB (ref 0.9–3.3)
LYMPH%: 13.3 % — AB (ref 14.0–49.7)
MCH: 31.7 pg (ref 25.1–34.0)
MCHC: 32.3 g/dL (ref 31.5–36.0)
MCV: 98.3 fL (ref 79.5–101.0)
MONO#: 0.3 10*3/uL (ref 0.1–0.9)
MONO%: 8 % (ref 0.0–14.0)
NEUT%: 77.6 % — ABNORMAL HIGH (ref 38.4–76.8)
NEUTROS ABS: 2.9 10*3/uL (ref 1.5–6.5)
NRBC: 0 % (ref 0–0)
PLATELETS: 195 10*3/uL (ref 145–400)
RBC: 2.87 10*6/uL — AB (ref 3.70–5.45)
RDW: 20.3 % — AB (ref 11.2–14.5)
WBC: 3.8 10*3/uL — ABNORMAL LOW (ref 3.9–10.3)

## 2014-05-30 LAB — PROTIME-INR
INR: 1.2 — AB (ref 2.00–3.50)
Protime: 14.4 Seconds — ABNORMAL HIGH (ref 10.6–13.4)

## 2014-05-30 LAB — PREPARE RBC (CROSSMATCH)

## 2014-05-30 MED ORDER — ENOXAPARIN SODIUM 100 MG/ML ~~LOC~~ SOLN: 1.5000 mg/kg | SUBCUTANEOUS | Status: DC

## 2014-05-30 MED ORDER — LIDOCAINE-PRILOCAINE 2.5-2.5 % EX CREA
1.0000 | TOPICAL_CREAM | CUTANEOUS | Status: DC | PRN
Start: 2014-05-30 — End: 2014-08-02

## 2014-05-30 NOTE — Progress Notes (Signed)
INR = 1.2 Hgb = 9.1 g/dL She restarted Coumadin on Wed 2/24 She is on Lovenox 85 mg SQ Q24 hrs (she confirmed her dose; she is discarding a portion of the syringe each time) Persistent bloody stools.  Her stomach is cramping also. I s/w Dr. Jana Hakim & he is ok w/ her going on long-term Lovenox (so is Dr. Mare Ferrari), however pt states she is afraid she will forget to take her Lovenox daily.  I think she prefers to go back on Coumadin because she will be coming here for CBC checks regularly anyway & can just have her INR's checked then. We will try to line up her lab appts & Coumadin clinic visits as much as possible.  She complained today that she always gets calls from our office telling her she has appts and they never line up correctly.  She has to call the office back and tell them she is not due for appts.   INR is low today but she's only had 5 doses of Coumadin.  We'll keep her on Coumadin 5 mg daily except 2.5 mg on Tu/Fri this week and recheck her INR next Mon (3/7). I refilled her Lovenox today. She will go to Sickle Cell unit tomorrow for PRBC's. Kennith Center, Pharm.D., CPP 05/30/2014@11 :59 AM

## 2014-05-31 ENCOUNTER — Ambulatory Visit (HOSPITAL_COMMUNITY)
Admission: RE | Admit: 2014-05-31 | Discharge: 2014-05-31 | Disposition: A | Discharge status: Home / Self Care | Payer: Medicare Other | Source: Ambulatory Visit | Attending: Oncology | Admitting: Oncology

## 2014-05-31 ENCOUNTER — Other Ambulatory Visit: Payer: Self-pay | Admitting: *Deleted

## 2014-05-31 ENCOUNTER — Other Ambulatory Visit: Payer: Medicare Other

## 2014-05-31 VITALS — BP 153/51 | HR 70 | Temp 98.0°F | Resp 18

## 2014-05-31 DIAGNOSIS — D500 Iron deficiency anemia secondary to blood loss (chronic): Secondary | ICD-10-CM | POA: Insufficient documentation

## 2014-05-31 DIAGNOSIS — D638 Anemia in other chronic diseases classified elsewhere: Secondary | ICD-10-CM

## 2014-05-31 DIAGNOSIS — D649 Anemia, unspecified: Secondary | ICD-10-CM

## 2014-05-31 LAB — PREPARE RBC (CROSSMATCH)

## 2014-05-31 MED ORDER — DIPHENHYDRAMINE HCL 25 MG PO CAPS
25.0000 mg | ORAL_CAPSULE | Freq: Once | ORAL | Status: AC
  Administered 2014-05-31: 25 mg via ORAL
  Filled 2014-05-31: qty 1

## 2014-05-31 MED ORDER — HEPARIN SOD (PORK) LOCK FLUSH 100 UNIT/ML IV SOLN: 250.0000 [IU] | INTRAVENOUS | Status: DC | PRN

## 2014-05-31 MED ORDER — SODIUM CHLORIDE 0.9 % IV SOLN
250.0000 mL | Freq: Once | INTRAVENOUS | Status: AC
  Administered 2014-05-31: 250 mL via INTRAVENOUS

## 2014-05-31 MED ORDER — SODIUM CHLORIDE 0.9 % IJ SOLN: 3.0000 mL | INTRAMUSCULAR | Status: DC | PRN

## 2014-05-31 MED ORDER — ACETAMINOPHEN 325 MG PO TABS
650.0000 mg | ORAL_TABLET | Freq: Once | ORAL | Status: AC
  Administered 2014-05-31: 650 mg via ORAL
  Filled 2014-05-31: qty 2

## 2014-05-31 MED ORDER — HEPARIN SOD (PORK) LOCK FLUSH 100 UNIT/ML IV SOLN
500.0000 [IU] | Freq: Every day | INTRAVENOUS | Status: DC | PRN
  Filled 2014-05-31: qty 5

## 2014-05-31 MED ORDER — SODIUM CHLORIDE 0.9 % IJ SOLN: 10.0000 mL | INTRAMUSCULAR | Status: DC | PRN

## 2014-05-31 NOTE — Procedures (Signed)
[facility]  Procedure Note  Deborah [name] MGQ:[id number] DOB: September 11, 1926 DOA: 05/31/2014   Dr. Jana Hakim  Associated Diagnosis: Symptomatic anemia D64.9  Procedure Note: port accessed, blood return noted, 2 units of PRBC's transfused per order, port flushed and de-accessed per protocol.   Condition During Procedure:  Patient stable,without any complaints   Condition at Discharge: patient stable, ambulatory at discharge.   Roberto Scales, RN  [facility]

## 2014-06-01 LAB — TYPE AND SCREEN
ABO/RH(D): O POS
Antibody Screen: NEGATIVE
UNIT DIVISION: 0
Unit division: 0

## 2014-06-06 ENCOUNTER — Encounter: Payer: Self-pay | Admitting: *Deleted

## 2014-06-06 ENCOUNTER — Other Ambulatory Visit (HOSPITAL_BASED_OUTPATIENT_CLINIC_OR_DEPARTMENT_OTHER): Payer: Medicare Other

## 2014-06-06 ENCOUNTER — Other Ambulatory Visit: Payer: Self-pay | Admitting: *Deleted

## 2014-06-06 ENCOUNTER — Encounter: Payer: Self-pay | Admitting: Oncology

## 2014-06-06 ENCOUNTER — Ambulatory Visit (HOSPITAL_BASED_OUTPATIENT_CLINIC_OR_DEPARTMENT_OTHER): Payer: Medicare Other | Admitting: Pharmacist

## 2014-06-06 DIAGNOSIS — D689 Coagulation defect, unspecified: Secondary | ICD-10-CM

## 2014-06-06 DIAGNOSIS — Z7901 Long term (current) use of anticoagulants: Secondary | ICD-10-CM

## 2014-06-06 DIAGNOSIS — Z954 Presence of other heart-valve replacement: Secondary | ICD-10-CM

## 2014-06-06 LAB — CBC WITH DIFFERENTIAL/PLATELET
BASO%: 0.2 % (ref 0.0–2.0)
Basophils Absolute: 0 10*3/uL (ref 0.0–0.1)
EOS ABS: 0 10*3/uL (ref 0.0–0.5)
EOS%: 0.8 % (ref 0.0–7.0)
HEMATOCRIT: 33.9 % — AB (ref 34.8–46.6)
HGB: 11 g/dL — ABNORMAL LOW (ref 11.6–15.9)
LYMPH#: 0.6 10*3/uL — AB (ref 0.9–3.3)
LYMPH%: 11.4 % — AB (ref 14.0–49.7)
MCH: 30.6 pg (ref 25.1–34.0)
MCHC: 32.4 g/dL (ref 31.5–36.0)
MCV: 94.2 fL (ref 79.5–101.0)
MONO#: 0.4 10*3/uL (ref 0.1–0.9)
MONO%: 7.8 % (ref 0.0–14.0)
NEUT%: 79.8 % — ABNORMAL HIGH (ref 38.4–76.8)
NEUTROS ABS: 4 10*3/uL (ref 1.5–6.5)
PLATELETS: 232 10*3/uL (ref 145–400)
RBC: 3.6 10*6/uL — AB (ref 3.70–5.45)
RDW: 18.4 % — ABNORMAL HIGH (ref 11.2–14.5)
WBC: 5 10*3/uL (ref 3.9–10.3)
nRBC: 0 % (ref 0–0)

## 2014-06-06 LAB — PROTIME-INR
INR: 1.4 — ABNORMAL LOW (ref 2.00–3.50)
Protime: 16.8 Seconds — ABNORMAL HIGH (ref 10.6–13.4)

## 2014-06-06 LAB — POCT INR: INR: 1.4

## 2014-06-06 MED ORDER — ENOXAPARIN SODIUM 100 MG/ML ~~LOC~~ SOLN: 1.5000 mg/kg | SUBCUTANEOUS | Status: DC

## 2014-06-06 NOTE — Progress Notes (Signed)
[employer] approved lovenox from 06/06/14-06/06/15

## 2014-06-06 NOTE — Progress Notes (Signed)
Received faxed paperwork from [employer] needing a prior authorization for Lovenox. Paperwork given to managed care.

## 2014-06-06 NOTE — Progress Notes (Signed)
INR below goal at 1.4 Pt on coumadin and lovenox 85 mg daily Pt now feels comfortable with lovenox and would like to discontinue coumadin This is reasonable. Prescription for lovenox called into pharmacy by Val, RN Pt instructed to stop coumadin Ms Dipierro will continue lovenox daily (85 mg or 1.5 mg/kg) She will call with any issues She will continue to have a CBC checked ~ every 2 weeks Hgb is 11 today which is improved from last week She will keep her appointment for next week in order to get back on track with her appointments

## 2014-06-06 NOTE — Progress Notes (Signed)
Faxed lovenox pa form to [employer]

## 2014-06-06 NOTE — Patient Instructions (Signed)
Stop coumadin. Change to lovenox only.  Continue lovenox 85 mg daily  Recheck CBC on 06/14/14:  lab at 10:30 am

## 2014-06-13 ENCOUNTER — Ambulatory Visit (HOSPITAL_BASED_OUTPATIENT_CLINIC_OR_DEPARTMENT_OTHER): Payer: Medicare Other | Admitting: Nurse Practitioner

## 2014-06-13 ENCOUNTER — Telehealth: Payer: Self-pay | Admitting: Nurse Practitioner

## 2014-06-13 ENCOUNTER — Telehealth: Payer: Self-pay

## 2014-06-13 ENCOUNTER — Other Ambulatory Visit (HOSPITAL_BASED_OUTPATIENT_CLINIC_OR_DEPARTMENT_OTHER): Payer: Medicare Other

## 2014-06-13 VITALS — BP 129/62 | HR 66 | Temp 98.4°F | Resp 16 | Wt 123.3 lb

## 2014-06-13 DIAGNOSIS — Z7901 Long term (current) use of anticoagulants: Secondary | ICD-10-CM | POA: Diagnosis not present

## 2014-06-13 DIAGNOSIS — R52 Pain, unspecified: Secondary | ICD-10-CM

## 2014-06-13 DIAGNOSIS — D500 Iron deficiency anemia secondary to blood loss (chronic): Secondary | ICD-10-CM | POA: Diagnosis not present

## 2014-06-13 DIAGNOSIS — K922 Gastrointestinal hemorrhage, unspecified: Secondary | ICD-10-CM | POA: Diagnosis not present

## 2014-06-13 LAB — CBC WITH DIFFERENTIAL/PLATELET
BASO%: 0.2 % (ref 0.0–2.0)
Basophils Absolute: 0 10*3/uL (ref 0.0–0.1)
EOS ABS: 0 10*3/uL (ref 0.0–0.5)
EOS%: 0.8 % (ref 0.0–7.0)
HCT: 34.5 % — ABNORMAL LOW (ref 34.8–46.6)
HGB: 10.9 g/dL — ABNORMAL LOW (ref 11.6–15.9)
LYMPH%: 14.2 % (ref 14.0–49.7)
MCH: 30.4 pg (ref 25.1–34.0)
MCHC: 31.6 g/dL (ref 31.5–36.0)
MCV: 96.4 fL (ref 79.5–101.0)
MONO#: 0.3 10*3/uL (ref 0.1–0.9)
MONO%: 6.9 % (ref 0.0–14.0)
NEUT#: 3.7 10*3/uL (ref 1.5–6.5)
NEUT%: 77.9 % — ABNORMAL HIGH (ref 38.4–76.8)
Platelets: 208 10*3/uL (ref 145–400)
RBC: 3.58 10*6/uL — AB (ref 3.70–5.45)
RDW: 17.4 % — AB (ref 11.2–14.5)
WBC: 4.8 10*3/uL (ref 3.9–10.3)
lymph#: 0.7 10*3/uL — ABNORMAL LOW (ref 0.9–3.3)

## 2014-06-13 NOTE — Telephone Encounter (Signed)
Pt had lab appt and asked to be seen about pain and bruising around [facility]. POF sent

## 2014-06-13 NOTE — Telephone Encounter (Signed)
per pofo t add pt to sym clinic-pt here in lobby-pt already had labs

## 2014-06-14 ENCOUNTER — Other Ambulatory Visit: Payer: Self-pay | Admitting: Nurse Practitioner

## 2014-06-14 ENCOUNTER — Encounter: Payer: Self-pay | Admitting: Nurse Practitioner

## 2014-06-14 ENCOUNTER — Other Ambulatory Visit: Payer: Self-pay

## 2014-06-14 ENCOUNTER — Other Ambulatory Visit: Payer: Medicare Other

## 2014-06-14 ENCOUNTER — Telehealth: Payer: Self-pay | Admitting: Oncology

## 2014-06-14 DIAGNOSIS — R52 Pain, unspecified: Secondary | ICD-10-CM | POA: Insufficient documentation

## 2014-06-14 DIAGNOSIS — D500 Iron deficiency anemia secondary to blood loss (chronic): Secondary | ICD-10-CM

## 2014-06-14 DIAGNOSIS — K921 Melena: Secondary | ICD-10-CM

## 2014-06-14 NOTE — Progress Notes (Signed)
SYMPTOM MANAGEMENT CLINIC   HPI: Deborah [name] 81 y.o. female diagnosed with iron deficiency anemia.  Currently undergoing transfusional support on an as-needed basis.  Patient has a history of chronic GI bleed; and iron deficiency anemia which requires a lab draw every 2 weeks to monitor.  She also continues on Lovenox chronically for atrial fib and a mechanical aortic valve.  Patient had a Port-A-Cath placed to her left upper chest 3 weeks ago; it is complaining today of continued bruising in mild discomfort when she lies on the Port-A-Cath site.  She denies any chest pain, chest pressure, shortness breath, or pain with inspiration.  Also, patient reports that she has noted a slight increase in the blood in her stool over the past 2 days.  She denies any other GI symptoms whatsoever.  She denies any recent fevers or chills.   HPI  ROS  Past Medical History  Diagnosis Date  . Hypothyroidism   . Stomach problems     seeing Dr Watt Climes  . Hypertension   . GERD (gastroesophageal reflux disease)   . Anemia   . Arrhythmia     Atrial Fibrillation  . Breast cancer     "right; S/P lumpectomy, chemo, XRT"   . Complication of anesthesia     "once I'm awakened, I don't sleep til the following night"  . High cholesterol   . Heart murmur   . History of blood transfusion 1996; ~ 2013    "related to valve replacement; GI bleeding"   . Chronic lower GI bleeding   . Arthritis     "fingers, toes, hips; qwhere" (05/19/2013)  . Gout   . Pneumonia 07/2011    Past Surgical History  Procedure Laterality Date  . Arteriogram  01/12    NO RAS   . Cardioversion      X 2  . Cardiac catheterization    . Cataract extraction w/ intraocular lens  implant, bilateral Bilateral     bilateral cataract with lens implants  . Cholecystectomy    . Abdominal hysterectomy    . Appendectomy    . Colonoscopy with propofol N/A 11/24/2012    Procedure: COLONOSCOPY WITH PROPOFOL;  Surgeon: Jeryl Columbia, MD;   Location: [facility];  Service: Endoscopy;  Laterality: N/A;  . Breast biopsy Right   . Breast lumpectomy Right   . Aortic valve replacement  1996    [employer]  . Cardiac valve replacement  1996    has Hypothyroidism; Benign hypertensive heart disease without heart failure; S/P aortic valve replacement with metallic valve; Hypokalemia; Hyponatremia; Bronchitis; Nonsustained ventricular tachycardia; Long term current use of anticoagulant therapy; Atrial fibrillation; Dyslipidemia; Malaise and fatigue; Iron deficiency anemia due to chronic blood loss; Coagulopathy; Melena; GI bleed; Poor venous access; and Pain on her problem list.    is allergic to amiodarone; alendronate sodium; macrodantin; meclizine; norvasc; and tussionex pennkinetic er.    Medication List       This list is accurate as of: 06/13/14 11:59 PM.  Always use your most recent med list.               acetaminophen 500 MG tablet  Commonly known as:  TYLENOL  Take 1,000 mg by mouth every 6 (six) hours as needed for headache.     ALLEGRA ALLERGY 180 MG tablet  Generic drug:  fexofenadine  Take 180 mg by mouth daily as needed for allergies.     allopurinol 300 MG tablet  Commonly known  as:  ZYLOPRIM  TAKE ONE TABLET EVERY DAY     amLODipine 5 MG tablet  Commonly known as:  NORVASC  Take 1 tablet (5 mg total) by mouth daily.     BENTYL 10 MG capsule  Generic drug:  dicyclomine  Take 10 mg by mouth daily as needed for spasms.     calcium-vitamin D 500 MG tablet  Take 1 tablet by mouth 2 (two) times daily.     digoxin 0.125 MG tablet  Commonly known as:  LANOXIN  Take 1 tablet (125 mcg total) by mouth daily.     enoxaparin 100 MG/ML injection  Commonly known as:  LOVENOX  Inject 0.85 mLs (85 mg total) into the skin daily.     Fish Oil 1000 MG Caps  Take 1,000 mg by mouth 2 (two) times daily.     furosemide 40 MG tablet  Commonly known as:  LASIX  TAKE ONE TABLET EACH DAY     hydrochlorothiazide 12.5  MG capsule  Commonly known as:  MICROZIDE  Take 1 capsule (12.5 mg total) by mouth daily.     lidocaine-prilocaine cream  Commonly known as:  EMLA  Apply 1 application topically as needed.     losartan 25 MG tablet  Commonly known as:  COZAAR  Take 1 tablet (25 mg total) by mouth daily.     metoprolol tartrate 25 MG tablet  Commonly known as:  LOPRESSOR  TAKE ONE-HALF TABLET IN THE MORNING AND TAKE ONE TABLET IN THE EVENING     multivitamin per tablet  Take 1 tablet by mouth daily.     NITROSTAT 0.4 MG SL tablet  Generic drug:  nitroGLYCERIN  ONE TABLET UNDER TONGUE AS NEEDED     potassium chloride SA 20 MEQ tablet  Commonly known as:  K-DUR,KLOR-CON  TAKE ONE TABLET EVERY DAY     simvastatin 20 MG tablet  Commonly known as:  ZOCOR  Take 10 mg by mouth daily.     simvastatin 20 MG tablet  Commonly known as:  ZOCOR  TAKE ONE-HALF TABLET DAILY     SYNTHROID 88 MCG tablet  Generic drug:  levothyroxine  TAKE ONE TABLET EVERY DAY     zolpidem 12.5 MG CR tablet  Commonly known as:  AMBIEN CR  TAKE ONE TABLET AT BEDTIME AS NEEDED         PHYSICAL EXAMINATION  Oncology Vitals 06/13/2014 05/31/2014 05/31/2014 05/31/2014 05/31/2014 05/31/2014 05/25/2014  Height - - - - - - -  Weight 55.929 kg - - - - - -  Weight (lbs) 123 lbs 5 oz - - - - - -  BMI (kg/m2) - - - - - - -  Temp 98.4 98 98 97.7 97.7 97.7 98.8  Pulse 66 70 63 61 61 55 63  Resp 16 18 18 18 19 16 18   SpO2 - 99 100 100 98 100 98  BSA (m2) - - - - - - -   BP Readings from Last 3 Encounters:  06/13/14 129/62  05/31/14 153/51  05/25/14 150/68    Physical Exam  Constitutional: She is oriented to person, place, and time and well-developed, well-nourished, and in no distress.  HENT:  Head: Normocephalic and atraumatic.  Mouth/Throat: Oropharynx is clear and moist.  Eyes: Conjunctivae and EOM are normal. Pupils are equal, round, and reactive to light. Right eye exhibits no discharge. Left eye exhibits no discharge.  No scleral icterus.  Neck: Normal range of motion.  Pulmonary/Chest: Effort  normal. No respiratory distress.  Musculoskeletal: Normal range of motion. She exhibits no edema or tenderness.  Neurological: She is alert and oriented to person, place, and time. Gait normal.  Skin: Skin is warm and dry.  Left upper chest Port-A-Cath site intact; with some healing bruising to surrounding site.  There is no erythema, edema, warmth, or red streaks.  Site is nontender with palpation.  Psychiatric: Affect normal.  Nursing note and vitals reviewed.   LABORATORY DATA:. Appointment on 06/13/2014  Component Date Value Ref Range Status  . WBC 06/13/2014 4.8  3.9 - 10.3 10e3/uL Final  . NEUT# 06/13/2014 3.7  1.5 - 6.5 10e3/uL Final  . HGB 06/13/2014 10.9* 11.6 - 15.9 g/dL Final  . HCT 06/13/2014 34.5* 34.8 - 46.6 % Final  . Platelets 06/13/2014 208  145 - 400 10e3/uL Final  . MCV 06/13/2014 96.4  79.5 - 101.0 fL Final  . MCH 06/13/2014 30.4  25.1 - 34.0 pg Final  . MCHC 06/13/2014 31.6  31.5 - 36.0 g/dL Final  . RBC 06/13/2014 3.58* 3.70 - 5.45 10e6/uL Final  . RDW 06/13/2014 17.4* 11.2 - 14.5 % Final  . lymph# 06/13/2014 0.7* 0.9 - 3.3 10e3/uL Final  . MONO# 06/13/2014 0.3  0.1 - 0.9 10e3/uL Final  . Eosinophils Absolute 06/13/2014 0.0  0.0 - 0.5 10e3/uL Final  . Basophils Absolute 06/13/2014 0.0  0.0 - 0.1 10e3/uL Final  . NEUT% 06/13/2014 77.9* 38.4 - 76.8 % Final  . LYMPH% 06/13/2014 14.2  14.0 - 49.7 % Final  . MONO% 06/13/2014 6.9  0.0 - 14.0 % Final  . EOS% 06/13/2014 0.8  0.0 - 7.0 % Final  . BASO% 06/13/2014 0.2  0.0 - 2.0 % Final     RADIOGRAPHIC STUDIES: No results found.  ASSESSMENT/PLAN:    GI bleed Patient has a history of chronic low-grade GI bleed.  She also suffers with iron deficiency anemia; and has her labs drawn on an every two-week basis to monitor.  Patient states that she has noted a slight increase in the amount of blood in her stool within the past 2 days.  She  denies any new abdominal pain, cramping, or other GI symptoms whatsoever.  Patient would like to return to the [facility] for repeat labs this coming Thursday, 06/16/2014 to recheck her hemoglobin.  Will place these orders for patient.   Iron deficiency anemia due to chronic blood loss Patient has a history of chronic iron deficiency anemia secondary to blood loss.  At present, hemoglobin and hematocrit are stable.  Patient will continue to return for labs on an every two-week basis.  Also will arrange for patient to return this coming Thursday, 06/16/2014 for a midweek hemoglobin check due to worry of increased GI bleed per patient report.   Long term current use of anticoagulant therapy Patient continues on Lovenox on a once daily basis for chronic atrial fib and a mechanical aortic valve.   Pain Patient had a left upper chest Port-A-Cath inserted approximately 3 weeks ago.  She continues with some healing bruising to the Port-A-Cath site.  She is complaining of some discomfort when she lies on the Port-A-Cath site.  Patient denies any chest pain, chest pressure, shortness of breath, or pain with inspiration.  On exam-Port-A-Cath appears intact with no evidence of infection or displacement.  Reviewed labs drawn today; and both hemoglobin and platelet count stable.  Advised patient to monitor Port-A-Cath site closely; and let us know if the Port-A-Cath site discomfort  continues.   Patient stated understanding of all instructions; and was in agreement with this plan of care. The patient knows to call the clinic with any problems, questions or concerns.   Review/collaboration with Dr. Jana Hakim regarding all aspects of patient's visit today.   Total time spent with patient was 25 minutes;  with greater than 75 percent of that time spent in face to face counseling regarding patient's symptoms,  and coordination of care and follow up.  Disclaimer: This note was dictated with voice recognition  software. Similar sounding words can inadvertently be transcribed and may not be corrected upon review.   Drue Second, NP 06/14/2014

## 2014-06-14 NOTE — Assessment & Plan Note (Signed)
Patient continues on Lovenox on a once daily basis for chronic atrial fib and a mechanical aortic valve.

## 2014-06-14 NOTE — Assessment & Plan Note (Signed)
Patient has a history of chronic low-grade GI bleed.  She also suffers with iron deficiency anemia; and has her labs drawn on an every two-week basis to monitor.  Patient states that she has noted a slight increase in the amount of blood in her stool within the past 2 days.  She denies any new abdominal pain, cramping, or other GI symptoms whatsoever.  Patient would like to return to the [facility] for repeat labs this coming Thursday, 06/16/2014 to recheck her hemoglobin.  Will place these orders for patient.

## 2014-06-14 NOTE — Assessment & Plan Note (Signed)
Patient had a left upper chest Port-A-Cath inserted approximately 3 weeks ago.  She continues with some healing bruising to the Port-A-Cath site.  She is complaining of some discomfort when she lies on the Port-A-Cath site.  Patient denies any chest pain, chest pressure, shortness of breath, or pain with inspiration.  On exam-Port-A-Cath appears intact with no evidence of infection or displacement.  Reviewed labs drawn today; and both hemoglobin and platelet count stable.  Advised patient to monitor Port-A-Cath site closely; and let us know if the Port-A-Cath site discomfort continues.

## 2014-06-14 NOTE — Assessment & Plan Note (Signed)
Patient has a history of chronic iron deficiency anemia secondary to blood loss.  At present, hemoglobin and hematocrit are stable.  Patient will continue to return for labs on an every two-week basis.  Also will arrange for patient to return this coming Thursday, 06/16/2014 for a midweek hemoglobin check due to worry of increased GI bleed per patient report.

## 2014-06-14 NOTE — Telephone Encounter (Signed)
S/w pt re lab appt for 3/17. Other appts remain the same.

## 2014-06-16 ENCOUNTER — Other Ambulatory Visit: Payer: Self-pay | Admitting: *Deleted

## 2014-06-16 ENCOUNTER — Telehealth: Payer: Self-pay | Admitting: *Deleted

## 2014-06-16 ENCOUNTER — Other Ambulatory Visit (HOSPITAL_BASED_OUTPATIENT_CLINIC_OR_DEPARTMENT_OTHER): Payer: Medicare Other

## 2014-06-16 DIAGNOSIS — D500 Iron deficiency anemia secondary to blood loss (chronic): Secondary | ICD-10-CM

## 2014-06-16 DIAGNOSIS — D649 Anemia, unspecified: Secondary | ICD-10-CM

## 2014-06-16 DIAGNOSIS — K921 Melena: Secondary | ICD-10-CM

## 2014-06-16 LAB — CBC WITH DIFFERENTIAL/PLATELET
BASO%: 0.4 % (ref 0.0–2.0)
Basophils Absolute: 0 10*3/uL (ref 0.0–0.1)
EOS ABS: 0 10*3/uL (ref 0.0–0.5)
EOS%: 0.7 % (ref 0.0–7.0)
HEMATOCRIT: 29.9 % — AB (ref 34.8–46.6)
HGB: 9.7 g/dL — ABNORMAL LOW (ref 11.6–15.9)
LYMPH%: 9.6 % — ABNORMAL LOW (ref 14.0–49.7)
MCH: 30.8 pg (ref 25.1–34.0)
MCHC: 32.5 g/dL (ref 31.5–36.0)
MCV: 94.8 fL (ref 79.5–101.0)
MONO#: 0.4 10*3/uL (ref 0.1–0.9)
MONO%: 7.6 % (ref 0.0–14.0)
NEUT%: 81.7 % — AB (ref 38.4–76.8)
NEUTROS ABS: 4 10*3/uL (ref 1.5–6.5)
PLATELETS: 203 10*3/uL (ref 145–400)
RBC: 3.16 10*6/uL — ABNORMAL LOW (ref 3.70–5.45)
RDW: 18.7 % — ABNORMAL HIGH (ref 11.2–14.5)
WBC: 4.9 10*3/uL (ref 3.9–10.3)
lymph#: 0.5 10*3/uL — ABNORMAL LOW (ref 0.9–3.3)

## 2014-06-16 LAB — HOLD TUBE, BLOOD BANK

## 2014-06-16 NOTE — Telephone Encounter (Signed)
Per staff message and POF I have scheduled appts. Advised scheduler of appts. JMW  

## 2014-06-17 ENCOUNTER — Ambulatory Visit (HOSPITAL_BASED_OUTPATIENT_CLINIC_OR_DEPARTMENT_OTHER): Payer: Medicare Other

## 2014-06-17 ENCOUNTER — Other Ambulatory Visit: Payer: Self-pay | Admitting: *Deleted

## 2014-06-17 VITALS — BP 157/92 | HR 86 | Temp 97.0°F | Resp 18

## 2014-06-17 DIAGNOSIS — D649 Anemia, unspecified: Secondary | ICD-10-CM | POA: Diagnosis not present

## 2014-06-17 DIAGNOSIS — D500 Iron deficiency anemia secondary to blood loss (chronic): Secondary | ICD-10-CM | POA: Diagnosis not present

## 2014-06-17 LAB — PREPARE RBC (CROSSMATCH)

## 2014-06-17 MED ORDER — DIPHENHYDRAMINE HCL 25 MG PO CAPS
ORAL_CAPSULE | ORAL | Status: AC
  Filled 2014-06-17: qty 1

## 2014-06-17 MED ORDER — ACETAMINOPHEN 325 MG PO TABS
650.0000 mg | ORAL_TABLET | Freq: Once | ORAL | Status: AC
  Administered 2014-06-17: 650 mg via ORAL

## 2014-06-17 MED ORDER — ACETAMINOPHEN 325 MG PO TABS
ORAL_TABLET | ORAL | Status: AC
  Filled 2014-06-17: qty 2

## 2014-06-17 MED ORDER — SODIUM CHLORIDE 0.9 % IV SOLN
250.0000 mL | Freq: Once | INTRAVENOUS | Status: AC
  Administered 2014-06-17: 250 mL via INTRAVENOUS

## 2014-06-17 MED ORDER — DIPHENHYDRAMINE HCL 25 MG PO CAPS
25.0000 mg | ORAL_CAPSULE | Freq: Once | ORAL | Status: AC
Start: 2014-06-17 — End: 2014-06-17
  Administered 2014-06-17: 25 mg via ORAL

## 2014-06-17 NOTE — Patient Instructions (Signed)

## 2014-06-18 LAB — TYPE AND SCREEN
ABO/RH(D): O POS
Antibody Screen: NEGATIVE
Unit division: 0

## 2014-06-19 ENCOUNTER — Other Ambulatory Visit: Payer: Self-pay | Admitting: Oncology

## 2014-06-28 ENCOUNTER — Other Ambulatory Visit: Payer: Medicare Other

## 2014-06-28 ENCOUNTER — Other Ambulatory Visit: Payer: Self-pay | Admitting: *Deleted

## 2014-06-28 ENCOUNTER — Encounter: Payer: Self-pay | Admitting: *Deleted

## 2014-06-28 ENCOUNTER — Telehealth: Payer: Self-pay | Admitting: Oncology

## 2014-06-28 ENCOUNTER — Other Ambulatory Visit (HOSPITAL_BASED_OUTPATIENT_CLINIC_OR_DEPARTMENT_OTHER): Payer: Medicare Other

## 2014-06-28 DIAGNOSIS — D638 Anemia in other chronic diseases classified elsewhere: Secondary | ICD-10-CM

## 2014-06-28 DIAGNOSIS — D500 Iron deficiency anemia secondary to blood loss (chronic): Secondary | ICD-10-CM | POA: Diagnosis not present

## 2014-06-28 LAB — CBC WITH DIFFERENTIAL/PLATELET
BASO%: 0.2 % (ref 0.0–2.0)
BASOS ABS: 0 10*3/uL (ref 0.0–0.1)
EOS%: 0.4 % (ref 0.0–7.0)
Eosinophils Absolute: 0 10*3/uL (ref 0.0–0.5)
HCT: 29.4 % — ABNORMAL LOW (ref 34.8–46.6)
HGB: 9.1 g/dL — ABNORMAL LOW (ref 11.6–15.9)
LYMPH%: 12.9 % — ABNORMAL LOW (ref 14.0–49.7)
MCH: 30.2 pg (ref 25.1–34.0)
MCHC: 31 g/dL — AB (ref 31.5–36.0)
MCV: 97.7 fL (ref 79.5–101.0)
MONO#: 0.3 10*3/uL (ref 0.1–0.9)
MONO%: 7.4 % (ref 0.0–14.0)
NEUT%: 79.1 % — ABNORMAL HIGH (ref 38.4–76.8)
NEUTROS ABS: 3.6 10*3/uL (ref 1.5–6.5)
PLATELETS: 209 10*3/uL (ref 145–400)
RBC: 3.01 10*6/uL — ABNORMAL LOW (ref 3.70–5.45)
RDW: 17.8 % — AB (ref 11.2–14.5)
WBC: 4.6 10*3/uL (ref 3.9–10.3)
lymph#: 0.6 10*3/uL — ABNORMAL LOW (ref 0.9–3.3)
nRBC: 0 % (ref 0–0)

## 2014-06-28 LAB — PREPARE RBC (CROSSMATCH)

## 2014-06-28 NOTE — Telephone Encounter (Signed)
per pof to sch pt appt lab add on per Staci-added

## 2014-06-28 NOTE — Progress Notes (Signed)
Patient came to [facility] today to have labs drawn. HGB was 9.1. Per Dr. Jana Hakim, schedule her for one unit of blood. Orders done. Patient to have blood transfusion at [facility] on 06/29/14 at 10:00 am. Patient verbalized understanding of date, time and place for transfusion.

## 2014-06-29 ENCOUNTER — Ambulatory Visit (HOSPITAL_COMMUNITY)
Admission: RE | Admit: 2014-06-29 | Discharge: 2014-06-29 | Disposition: A | Discharge status: Home / Self Care | Payer: Medicare Other | Source: Ambulatory Visit | Attending: Oncology | Admitting: Oncology

## 2014-06-29 VITALS — BP 121/44 | HR 74 | Temp 98.1°F | Resp 18

## 2014-06-29 DIAGNOSIS — D638 Anemia in other chronic diseases classified elsewhere: Secondary | ICD-10-CM

## 2014-06-29 DIAGNOSIS — D500 Iron deficiency anemia secondary to blood loss (chronic): Secondary | ICD-10-CM | POA: Diagnosis not present

## 2014-06-29 MED ORDER — SODIUM CHLORIDE 0.9 % IJ SOLN
10.0000 mL | INTRAMUSCULAR | Status: AC | PRN
  Administered 2014-06-29: 10 mL

## 2014-06-29 MED ORDER — SODIUM CHLORIDE 0.9 % IV SOLN
250.0000 mL | Freq: Once | INTRAVENOUS | Status: AC
  Administered 2014-06-29: 250 mL via INTRAVENOUS

## 2014-06-29 MED ORDER — HEPARIN SOD (PORK) LOCK FLUSH 100 UNIT/ML IV SOLN
500.0000 [IU] | Freq: Every day | INTRAVENOUS | Status: AC | PRN
  Administered 2014-06-29: 500 [IU]
  Filled 2014-06-29: qty 5

## 2014-06-29 MED ORDER — DIPHENHYDRAMINE HCL 25 MG PO CAPS
25.0000 mg | ORAL_CAPSULE | Freq: Once | ORAL | Status: AC
  Administered 2014-06-29: 25 mg via ORAL
  Filled 2014-06-29: qty 1

## 2014-06-29 MED ORDER — ACETAMINOPHEN 325 MG PO TABS
650.0000 mg | ORAL_TABLET | Freq: Once | ORAL | Status: AC
  Administered 2014-06-29: 650 mg via ORAL
  Filled 2014-06-29: qty 2

## 2014-06-29 NOTE — Procedures (Signed)
[facility]  Procedure Note  Deborah [name] FDV:[id number] DOB: 11/04/1926 DOA: 06/29/2014   PCP: Darlin Coco, MD   Associated Diagnosis: iron deficiency anemia  Procedure Note: Pt received 1 unit PRBCs via Lt chest port. Pre-meds given.   Condition During Procedure: Pt tolerated well. No signs of transfusion reaction.   Condition at Discharge: Pt a&ox4, pt in no distress. Pt denies pain, pt denies any further needs at this time. Pt left ambulatory with daughter.   Trixie Rude, RN  [facility]

## 2014-06-30 LAB — TYPE AND SCREEN
ABO/RH(D): O POS
Antibody Screen: NEGATIVE
Unit division: 0

## 2014-07-06 ENCOUNTER — Other Ambulatory Visit: Payer: Self-pay | Admitting: *Deleted

## 2014-07-06 ENCOUNTER — Other Ambulatory Visit: Payer: Self-pay

## 2014-07-06 MED ORDER — DIGOXIN 125 MCG PO TABS: 125.0000 ug | ORAL_TABLET | Freq: Every day | ORAL | Status: DC

## 2014-07-08 ENCOUNTER — Telehealth: Payer: Self-pay

## 2014-07-08 MED ORDER — CELECOXIB 200 MG PO CAPS: ORAL_CAPSULE | ORAL | Status: DC

## 2014-07-08 NOTE — Telephone Encounter (Signed)
Pharmacy called regarding Celebrex refill Tylenol does not help. Patient would like to try Celebrex every other day Patient only taking Lovenox now and not Warfarin Patient c/o lot of joint pain  Will forward to  Dr. Mare Ferrari for review

## 2014-07-08 NOTE — Telephone Encounter (Signed)
OK to refill generic Celebrex

## 2014-07-08 NOTE — Telephone Encounter (Signed)
Advised patient, verbalized understanding  

## 2014-07-08 NOTE — Addendum Note (Signed)
Addended by: Alvina Filbert B on: 07/08/2014 05:52 PM   Modules accepted: Orders

## 2014-07-11 ENCOUNTER — Other Ambulatory Visit: Payer: Self-pay | Admitting: *Deleted

## 2014-07-11 ENCOUNTER — Other Ambulatory Visit: Payer: Self-pay | Admitting: Cardiology

## 2014-07-11 DIAGNOSIS — D500 Iron deficiency anemia secondary to blood loss (chronic): Secondary | ICD-10-CM

## 2014-07-12 ENCOUNTER — Telehealth: Payer: Self-pay | Admitting: *Deleted

## 2014-07-12 ENCOUNTER — Other Ambulatory Visit: Payer: Self-pay | Admitting: *Deleted

## 2014-07-12 ENCOUNTER — Telehealth: Payer: Self-pay | Admitting: Oncology

## 2014-07-12 ENCOUNTER — Other Ambulatory Visit (HOSPITAL_BASED_OUTPATIENT_CLINIC_OR_DEPARTMENT_OTHER): Payer: Medicare Other

## 2014-07-12 DIAGNOSIS — D500 Iron deficiency anemia secondary to blood loss (chronic): Secondary | ICD-10-CM

## 2014-07-12 LAB — CBC WITH DIFFERENTIAL/PLATELET
BASO%: 0.3 % (ref 0.0–2.0)
Basophils Absolute: 0 10*3/uL (ref 0.0–0.1)
EOS%: 1 % (ref 0.0–7.0)
Eosinophils Absolute: 0 10*3/uL (ref 0.0–0.5)
HEMATOCRIT: 33.3 % — AB (ref 34.8–46.6)
HEMOGLOBIN: 10.5 g/dL — AB (ref 11.6–15.9)
LYMPH#: 0.6 10*3/uL — AB (ref 0.9–3.3)
LYMPH%: 16.1 % (ref 14.0–49.7)
MCH: 29.4 pg (ref 25.1–34.0)
MCHC: 31.5 g/dL (ref 31.5–36.0)
MCV: 93.3 fL (ref 79.5–101.0)
MONO#: 0.3 10*3/uL (ref 0.1–0.9)
MONO%: 7.6 % (ref 0.0–14.0)
NEUT#: 3 10*3/uL (ref 1.5–6.5)
NEUT%: 75 % (ref 38.4–76.8)
Platelets: 201 10*3/uL (ref 145–400)
RBC: 3.57 10*6/uL — ABNORMAL LOW (ref 3.70–5.45)
RDW: 16.1 % — ABNORMAL HIGH (ref 11.2–14.5)
WBC: 4 10*3/uL (ref 3.9–10.3)

## 2014-07-12 LAB — HOLD TUBE, BLOOD BANK

## 2014-07-12 LAB — FERRITIN CHCC: FERRITIN: 26 ng/mL (ref 9–269)

## 2014-07-12 NOTE — Telephone Encounter (Signed)
This RN contacted pt to inform her of ferritin level of 26 and need for iron infusion.  Deborah [name] verbalized understanding.  POF sent to scheduling.  This note will be sent to MD for review and fereheme orders.

## 2014-07-12 NOTE — Telephone Encounter (Signed)
Confirm appointment for 04/15.

## 2014-07-12 NOTE — Telephone Encounter (Signed)
Per staff message and POF I have scheduled appts. Advised scheduler of appts. JMW  

## 2014-07-14 ENCOUNTER — Other Ambulatory Visit: Payer: Self-pay | Admitting: Oncology

## 2014-07-15 ENCOUNTER — Ambulatory Visit (HOSPITAL_BASED_OUTPATIENT_CLINIC_OR_DEPARTMENT_OTHER): Payer: Medicare Other

## 2014-07-15 VITALS — BP 143/70 | HR 70 | Temp 97.9°F

## 2014-07-15 DIAGNOSIS — R5381 Other malaise: Secondary | ICD-10-CM

## 2014-07-15 DIAGNOSIS — K922 Gastrointestinal hemorrhage, unspecified: Secondary | ICD-10-CM | POA: Diagnosis not present

## 2014-07-15 DIAGNOSIS — D500 Iron deficiency anemia secondary to blood loss (chronic): Secondary | ICD-10-CM | POA: Diagnosis present

## 2014-07-15 DIAGNOSIS — R5383 Other fatigue: Principal | ICD-10-CM

## 2014-07-15 MED ORDER — FERUMOXYTOL INJECTION 510 MG/17 ML
510.0000 mg | Freq: Once | INTRAVENOUS | Status: AC
  Administered 2014-07-15: 510 mg via INTRAVENOUS
  Filled 2014-07-15: qty 17

## 2014-07-15 NOTE — Patient Instructions (Signed)

## 2014-07-22 ENCOUNTER — Ambulatory Visit (HOSPITAL_BASED_OUTPATIENT_CLINIC_OR_DEPARTMENT_OTHER): Payer: Medicare Other

## 2014-07-22 DIAGNOSIS — D500 Iron deficiency anemia secondary to blood loss (chronic): Secondary | ICD-10-CM

## 2014-07-22 DIAGNOSIS — R5381 Other malaise: Secondary | ICD-10-CM

## 2014-07-22 DIAGNOSIS — D649 Anemia, unspecified: Secondary | ICD-10-CM

## 2014-07-22 DIAGNOSIS — K922 Gastrointestinal hemorrhage, unspecified: Secondary | ICD-10-CM | POA: Diagnosis not present

## 2014-07-22 DIAGNOSIS — R5383 Other fatigue: Principal | ICD-10-CM

## 2014-07-22 MED ORDER — SODIUM CHLORIDE 0.9 % IV SOLN: 250.0000 mL | Freq: Once | INTRAVENOUS | Status: DC

## 2014-07-22 MED ORDER — SODIUM CHLORIDE 0.9 % IV SOLN
Freq: Once | INTRAVENOUS | Status: AC
  Administered 2014-07-22: 14:00:00 via INTRAVENOUS

## 2014-07-22 MED ORDER — SODIUM CHLORIDE 0.9 % IV SOLN
510.0000 mg | Freq: Once | INTRAVENOUS | Status: AC
  Administered 2014-07-22: 510 mg via INTRAVENOUS
  Filled 2014-07-22: qty 17

## 2014-07-22 MED ORDER — SODIUM CHLORIDE 0.9 % IJ SOLN
10.0000 mL | INTRAMUSCULAR | Status: DC | PRN
  Administered 2014-07-22: 10 mL via INTRAVENOUS
  Filled 2014-07-22: qty 10

## 2014-07-22 MED ORDER — HEPARIN SOD (PORK) LOCK FLUSH 100 UNIT/ML IV SOLN
500.0000 [IU] | Freq: Once | INTRAVENOUS | Status: AC
  Administered 2014-07-22: 500 [IU] via INTRAVENOUS
  Filled 2014-07-22: qty 5

## 2014-07-22 NOTE — Patient Instructions (Signed)

## 2014-07-25 ENCOUNTER — Telehealth: Payer: Self-pay | Admitting: Cardiology

## 2014-07-25 NOTE — Telephone Encounter (Signed)
Patient has been gotten Ferritin infusions for the last 2 Fridays (4/15 & 4/22) She has been waking up with a headache and decided to check her blood pressure, yesterday 180/95 HR 90 and today 180/81 HR 82 Patient states after she takes her medication her blood pressure does come down to normal Last night blood pressure 167/86  Patient concerned with the elevated blood pressure in am  Will forward to  Dr. Mare Ferrari for review

## 2014-07-25 NOTE — Telephone Encounter (Signed)
Advised patient Patient to call back Thursday with update

## 2014-07-25 NOTE — Telephone Encounter (Signed)
Left message to call back  

## 2014-07-25 NOTE — Telephone Encounter (Signed)
New message     Patient calling    Pt c/o BP issue: STAT if pt c/o blurred vision, one-sided weakness or slurred speech  1. What are your last 5 BP readings? Yesterday am 180/95 pulse 90. This am  180/81 pulse 82   2. Are you having any other symptoms (ex. Dizziness, headache, blurred vision, passed out)? Headache waking her up in the am.    3. What is your BP issue? Think blood pressure medication needs to be adjusted.

## 2014-07-25 NOTE — Telephone Encounter (Signed)
Increase losartan to 50 mg daily.

## 2014-07-26 ENCOUNTER — Other Ambulatory Visit (HOSPITAL_BASED_OUTPATIENT_CLINIC_OR_DEPARTMENT_OTHER): Payer: Medicare Other

## 2014-07-26 ENCOUNTER — Ambulatory Visit (HOSPITAL_BASED_OUTPATIENT_CLINIC_OR_DEPARTMENT_OTHER): Payer: Medicare Other | Admitting: Oncology

## 2014-07-26 ENCOUNTER — Telehealth: Payer: Self-pay | Admitting: Oncology

## 2014-07-26 VITALS — BP 160/66 | HR 67 | Temp 98.0°F | Resp 18 | Ht 60.0 in | Wt 121.0 lb

## 2014-07-26 DIAGNOSIS — I4891 Unspecified atrial fibrillation: Secondary | ICD-10-CM | POA: Diagnosis not present

## 2014-07-26 DIAGNOSIS — K2961 Other gastritis with bleeding: Secondary | ICD-10-CM

## 2014-07-26 DIAGNOSIS — Z952 Presence of prosthetic heart valve: Secondary | ICD-10-CM

## 2014-07-26 DIAGNOSIS — Z7901 Long term (current) use of anticoagulants: Secondary | ICD-10-CM

## 2014-07-26 DIAGNOSIS — K922 Gastrointestinal hemorrhage, unspecified: Secondary | ICD-10-CM

## 2014-07-26 DIAGNOSIS — D500 Iron deficiency anemia secondary to blood loss (chronic): Secondary | ICD-10-CM

## 2014-07-26 DIAGNOSIS — Z954 Presence of other heart-valve replacement: Secondary | ICD-10-CM

## 2014-07-26 DIAGNOSIS — D689 Coagulation defect, unspecified: Secondary | ICD-10-CM

## 2014-07-26 LAB — CBC WITH DIFFERENTIAL/PLATELET
BASO%: 0.2 % (ref 0.0–2.0)
Basophils Absolute: 0 10*3/uL (ref 0.0–0.1)
EOS%: 1.7 % (ref 0.0–7.0)
Eosinophils Absolute: 0.1 10*3/uL (ref 0.0–0.5)
HEMATOCRIT: 36.9 % (ref 34.8–46.6)
HEMOGLOBIN: 11.6 g/dL (ref 11.6–15.9)
LYMPH#: 0.7 10*3/uL — AB (ref 0.9–3.3)
LYMPH%: 16.4 % (ref 14.0–49.7)
MCH: 30.4 pg (ref 25.1–34.0)
MCHC: 31.4 g/dL — AB (ref 31.5–36.0)
MCV: 96.6 fL (ref 79.5–101.0)
MONO#: 0.3 10*3/uL (ref 0.1–0.9)
MONO%: 7.4 % (ref 0.0–14.0)
NEUT#: 3.1 10*3/uL (ref 1.5–6.5)
NEUT%: 74.3 % (ref 38.4–76.8)
PLATELETS: 176 10*3/uL (ref 145–400)
RBC: 3.82 10*6/uL (ref 3.70–5.45)
RDW: 18 % — AB (ref 11.2–14.5)
WBC: 4.2 10*3/uL (ref 3.9–10.3)
nRBC: 0 % (ref 0–0)

## 2014-07-26 NOTE — Progress Notes (Signed)
ID: [name]   DOB: Feb 12, 1927  MR#: [id number]  YPP#:[id number]  PCP: Darlin Coco, MD GYN:  SU:  OTHER MD: Clarene Essex, Rema Jasmine   HISTORY OF PRESENT ILLNESS: [name]") [name] is an 81 year old [place] woman referred by Dr. Watt Climes for evaluation and treatment of iron deficiency anemia.  INTERVAL HISTORY: [name] returns today for followup of her acute blood loss/iron deficiency anemia. She had her most recent Feraheme dose a week ago. She tolerated that well. She has not noticed any black or tarry stools or any other evidence of GI bleeding since going on Lovenox, she tells me. She tolerates the Lovenox well in terms of the injection itself. She keeps a record at home so she knows note only when she gave it but where she gave it. "I don't want to double up".  REVIEW OF SYSTEMS: She has been having some headaches in the morning at waking up and has noticed her blood pressure tends to run high in the morning. She took an extra Lasix today. She has a ready call Dr. Sherryl Barters office to try to get this fixed. She needs to have her eyes checked she says, she gets short of breath walking up stairs, she sleeps on 2 pillows, she bruises easily, she has arthritis and joint pain which is not more intense or persistent than before, but otherwise a detailed review of systems today was entirely stable.  PAST MEDICAL HISTORY: Past Medical History  Diagnosis Date  . Hypothyroidism   . Stomach problems     seeing Dr Watt Climes  . Hypertension   . GERD (gastroesophageal reflux disease)   . Anemia   . Arrhythmia     Atrial Fibrillation  . Breast cancer     "right; S/P lumpectomy, chemo, XRT"   . Complication of anesthesia     "once I'm awakened, I don't sleep til the following night"  . High cholesterol   . Heart murmur   . History of blood transfusion 1996; ~ 2013    "related to valve replacement; GI bleeding"   . Chronic lower GI bleeding   . Arthritis     "fingers,  toes, hips; qwhere" (05/19/2013)  . Gout   . Pneumonia 07/2011    PAST SURGICAL HISTORY: Past Surgical History  Procedure Laterality Date  . Arteriogram  01/12    NO RAS   . Cardioversion      X 2  . Cardiac catheterization    . Cataract extraction w/ intraocular lens  implant, bilateral Bilateral     bilateral cataract with lens implants  . Cholecystectomy    . Abdominal hysterectomy    . Appendectomy    . Colonoscopy with propofol N/A 11/24/2012    Procedure: COLONOSCOPY WITH PROPOFOL;  Surgeon: Jeryl Columbia, MD;  Location: [facility];  Service: Endoscopy;  Laterality: N/A;  . Breast biopsy Right   . Breast lumpectomy Right   . Aortic valve replacement  1996    St. Jude  . Cardiac valve replacement  1996    FAMILY HISTORY Family History  Problem Relation Age of Onset  . Dementia Mother   . Coronary artery disease Father    the patient's father died from a myocardial infarction at the age of 31. The patient's mother died with Alzheimer's disease at the age of 37. She had 2 brothers and one sister. Her sister had colon cancer diagnosed at age 93. Otherwise the only other person with cancer in the  family was the patient's mother's twin sister, diagnosed with breast cancer at age 18.  GYNECOLOGIC HISTORY: Menarche age 45, first live birth age 83, she is [place] P3. Underwent menopause 1972. She took hormone replacement for approximately 21 years.  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 Her daughter Dixon Boos runs the [employer] here. Daughter [name] also lives in [place], is a homemaker. Son [name] "[name]" Bruns is a Scientist, research (medical). The patient has of 10 grandchildren She attends a [employer].   ADVANCED DIRECTIVES: Living will in place. Her daughter Dixon Boos is her healthcare power of attorney.  HEALTH MAINTENANCE: History  Substance Use Topics  . Smoking status: Former Smoker -- 1.00 packs/day for 20  years    Types: Cigarettes    Quit date: 04/01/1988  . Smokeless tobacco: Never Used  . Alcohol Use: 4.8 oz/week    4 Glasses of wine, 4 Shots of liquor per week     Comment: burbon or glass wine daily     Allergies  Allergen Reactions  . Amiodarone Shortness Of Breath  . Alendronate Sodium     GI upset  . Macrodantin [Nitrofurantoin]     GI upset  . Meclizine Swelling    tongue  . Norvasc [Amlodipine Besylate]     edema  . Tussionex Pennkinetic Er [Hydrocod Polst-Cpm Polst Er] Other (See Comments)    Reaction unknown    Current Outpatient Prescriptions  Medication Sig Dispense Refill  . acetaminophen (TYLENOL) 500 MG tablet Take 1,000 mg by mouth every 6 (six) hours as needed for headache.    . allopurinol (ZYLOPRIM) 300 MG tablet TAKE ONE TABLET EACH DAY 30 tablet 3  . amLODipine (NORVASC) 5 MG tablet Take 1 tablet (5 mg total) by mouth daily. 90 tablet 3  . calcium-vitamin D 500 MG tablet Take 1 tablet by mouth 2 (two) times daily.      . celecoxib (CELEBREX) 200 MG capsule 1 by mouth every other day 15 capsule 3  . dicyclomine (BENTYL) 10 MG capsule Take 10 mg by mouth daily as needed for spasms.     . digoxin (LANOXIN) 0.125 MG tablet Take 1 tablet (125 mcg total) by mouth daily. 30 tablet 3  . enoxaparin (LOVENOX) 100 MG/ML injection Inject 0.85 mLs (85 mg total) into the skin daily. 30 Syringe 3  . fexofenadine (ALLEGRA ALLERGY) 180 MG tablet Take 180 mg by mouth daily as needed for allergies.     . furosemide (LASIX) 40 MG tablet TAKE ONE TABLET EACH DAY (Patient taking differently: Take one tablet daily as needed for fluid.) 30 tablet 6  . hydrochlorothiazide (MICROZIDE) 12.5 MG capsule Take 1 capsule (12.5 mg total) by mouth daily. 90 capsule 3  . lidocaine-prilocaine (EMLA) cream Apply 1 application topically as needed. 30 g 2  . losartan (COZAAR) 25 MG tablet Take 1 tablet (25 mg total) by mouth daily. (Patient taking differently: Take 25 mg by mouth 2 (two) times  daily. ) 90 tablet 3  . metoprolol tartrate (LOPRESSOR) 25 MG tablet TAKE ONE-HALF TABLET IN THE MORNING AND TAKE ONE TABLET IN THE EVENING 45 tablet 3  . multivitamin (THERAGRAN) per tablet Take 1 tablet by mouth daily.     Marland Kitchen NITROSTAT 0.4 MG SL tablet ONE TABLET UNDER TONGUE AS NEEDED 25 tablet 3  . Omega-3 Fatty Acids (FISH OIL) 1000 MG CAPS Take 1,000 mg by mouth 2 (two) times daily.     Marland Kitchen  potassium chloride SA (K-DUR,KLOR-CON) 20 MEQ tablet TAKE ONE TABLET EVERY DAY 30 tablet 6  . simvastatin (ZOCOR) 20 MG tablet Take 10 mg by mouth daily.    . simvastatin (ZOCOR) 20 MG tablet TAKE ONE-HALF TABLET DAILY 30 tablet 3  . SYNTHROID 88 MCG tablet TAKE ONE TABLET EVERY DAY 30 tablet 3  . zolpidem (AMBIEN CR) 12.5 MG CR tablet TAKE ONE TABLET AT BEDTIME AS NEEDED (Patient taking differently: Take one tablet at bedtime as needed for sleep.) 30 tablet 5   No current facility-administered medications for this visit.    OBJECTIVE: Elderly white woman who appears well Filed Vitals:   07/26/14 1048  BP: 160/66  Pulse: 67  Temp: 98 F (36.7 C)  Resp: 18     Body mass index is 23.63 kg/(m^2).    ECOG FS: 0  Sclerae unicteric, EOMs intact Oropharynx clear and moist  No cervical or supraclavicular adenopathy Lungs no rales or rhonchi Heart controlled though irregular rate, valvular "click" audible, unchanged from prior Abd soft, nontender, positive bowel sounds MSK no focal spinal tenderness, no peripheral edema Neuro: nonfocal, well oriented, pleasant affect Breasts: Deferred   LAB RESULTS: Lab Results  Component Value Date   WBC 4.2 07/26/2014   NEUTROABS 3.1 07/26/2014   HGB 11.6 07/26/2014   HCT 36.9 07/26/2014   MCV 96.6 07/26/2014   PLT 176 07/26/2014      Chemistry      Component Value Date/Time   NA 134* 05/24/2014 1300   NA 132* 04/19/2014 1025   K 4.4 05/24/2014 1300   K 4.4 04/19/2014 1025   CL 103 05/24/2014 1300   CO2 27 05/24/2014 1300   CO2 28 04/19/2014 1025    BUN 36* 05/24/2014 1300   BUN 21.9 04/19/2014 1025   CREATININE 0.74 05/24/2014 1300   CREATININE 0.7 04/19/2014 1025      Component Value Date/Time   CALCIUM 10.3 05/24/2014 1300   CALCIUM 9.6 04/19/2014 1025   ALKPHOS 48 05/13/2014 1223   ALKPHOS 64 04/19/2014 1025   AST 44* 05/13/2014 1223   AST 19 04/19/2014 1025   ALT 32 05/13/2014 1223   ALT 20 04/19/2014 1025   BILITOT 0.8 05/13/2014 1223   BILITOT 0.30 04/19/2014 1025      No results found for: LABCA2  No components found for: LABCA125  No results for input(s): INR in the last 168 hours.  Urinalysis    Component Value Date/Time   COLORURINE YELLOW 05/13/2014 [place] 05/13/2014 1126   LABSPEC 1.005 05/13/2014 1126   LABSPEC 1.010 02/08/2014 1128   PHURINE 7.5 05/13/2014 1126   GLUCOSEU NEGATIVE 05/13/2014 1126   GLUCOSEU Negative 02/08/2014 1128   GLUCOSEU NEGATIVE 04/07/2013 1123   HGBUR NEGATIVE 05/13/2014 1126   BILIRUBINUR NEGATIVE 05/13/2014 1126   KETONESUR NEGATIVE 05/13/2014 [place] 05/13/2014 1126   UROBILINOGEN 0.2 05/13/2014 1126   UROBILINOGEN 0.2 02/08/2014 1128   NITRITE NEGATIVE 05/13/2014 1126   NITRITE Negative 02/08/2014 1128   LEUKOCYTESUR NEGATIVE 05/13/2014 1126  Results for KERRA, GUILFOIL (MRN [id number]) as of 07/26/2014 11:03  Ref. Range 02/21/2014 11:23 03/16/2014 10:32 04/19/2014 10:36 05/14/2014 10:39 07/12/2014 10:41  Ferritin Latest Ref Range: 9-269 ng/ml 41 300 (H) 52 285 26  Results for ANNEMARIE, SEBREE (MRN [id number]) as of 07/26/2014 11:03  Ref. Range 02/22/2013 10:42 03/16/2013 13:11 04/14/2013 09:52 06/01/2013 13:57 06/15/2013 11:07  Retic Ct Abs Latest Ref Range: 33.70-90.70 10e3/uL 84.40 94.30 (H) 103.22 (  H) 299.08 (H) 118.50 (H)   STUDIES: No results found.  ASSESSMENT: 81 y.o. [place] with iron deficiency anemia secondary to chronic blood loss, s/p oral iron supplementation with poor tolerance and inadequate response  (1) Feraheme  started 07/24/2012, repeated whenever ferritin drops <100, mosr recent dose 07/22/2014  (2) transfusing PBBCs for Hb < 9.0 or symptoms  (3) chronic GIB extensively evaluated by GI with findings including AVM (cauterized), diverticular disease, hemorrhoids and gastric polyps  (4) on lifelong anticoagulation for AFib and mechanical aortic valve, currently on Lovenox daily  PLAN: Clarise Cruz appears to be having less of a GI bleeding problem on Lovenox than she did on Coumadin. If this turns out to be true, she will be needing fewer treatments and we may be able to lengthen her lab work interval from 2 weeks to 3 or perhaps even monthly. We will keep an eye on that.  Right now she requires no hematologic intervention. She is concerned about her blood pressure issues and she will follow-up with Dr. Mare Ferrari regarding that. She will see me again in one year. She knows to call for any problems that may develop before then.   Nikko Quast C    07/26/2014

## 2014-07-26 NOTE — Telephone Encounter (Signed)
Appointments made and avs printed for patient.  Patient request to start labs on 5/19 so she can go to the beach.  She also request to only make a few labs as she may be able to go monthly    anne

## 2014-07-28 ENCOUNTER — Telehealth: Payer: Self-pay | Admitting: *Deleted

## 2014-07-28 NOTE — Telephone Encounter (Signed)
Advised patient, verbalized understanding  

## 2014-07-28 NOTE — Telephone Encounter (Signed)
Patient phoned today to update blood pressure readings since increasing Losartan to 50 mg daily Patient for last 2 nights has taken the Losartan in the evening  Patient states headache wakes her up in am Blood pressure yesterday am at 4:00 160/70 HR 66, 10:30 am 139/80 and last night systolic 779. This am at 5:00 am 168/80 and 7:15 153/88 Will forward to  Dr. Mare Ferrari for review

## 2014-07-28 NOTE — Telephone Encounter (Signed)
Try increasing the losartan and up to 50 mg twice a day

## 2014-07-29 ENCOUNTER — Other Ambulatory Visit: Payer: Self-pay | Admitting: *Deleted

## 2014-07-29 MED ORDER — LOSARTAN POTASSIUM 50 MG PO TABS: 50.0000 mg | ORAL_TABLET | Freq: Two times a day (BID) | ORAL | Status: DC

## 2014-08-01 ENCOUNTER — Telehealth: Payer: Self-pay | Admitting: Cardiology

## 2014-08-01 NOTE — Telephone Encounter (Signed)
Patients blood pressure machine his lower than nurses results Discussed with  Dr. Mare Ferrari and will have patient take her Amlodipine 5 mg tonight and in am and keep ov tomorrow morning Advised patient, verbalized understanding

## 2014-08-01 NOTE — Telephone Encounter (Signed)
Follow Up      Pt returning [place] phone call.

## 2014-08-01 NOTE — Telephone Encounter (Signed)
Spoke with patient and her blood pressure is still up Patient states she is still waking up at 4 or 5 am with a headache At that time she will check her blood pressure and 160's-170's/90's (high 90's) This am took her meds and check her blood pressure, down to 154/70 Last night blood pressure was 163/99 Patient did have her monitor checked with facility nurse and blood pressure readings on her machine actually 15-20 lower than her machine reading Patient does have appointment in the am but concerned about high readings in the evening and early am  Will forward to  Dr. Mare Ferrari for review

## 2014-08-02 ENCOUNTER — Ambulatory Visit (INDEPENDENT_AMBULATORY_CARE_PROVIDER_SITE_OTHER): Payer: Medicare Other | Admitting: Cardiology

## 2014-08-02 ENCOUNTER — Encounter: Payer: Self-pay | Admitting: Cardiology

## 2014-08-02 VITALS — BP 136/60 | HR 61 | Ht 60.0 in | Wt 121.8 lb

## 2014-08-02 DIAGNOSIS — I119 Hypertensive heart disease without heart failure: Secondary | ICD-10-CM

## 2014-08-02 DIAGNOSIS — Z954 Presence of other heart-valve replacement: Secondary | ICD-10-CM | POA: Diagnosis not present

## 2014-08-02 DIAGNOSIS — D500 Iron deficiency anemia secondary to blood loss (chronic): Secondary | ICD-10-CM | POA: Diagnosis not present

## 2014-08-02 DIAGNOSIS — I482 Chronic atrial fibrillation, unspecified: Secondary | ICD-10-CM

## 2014-08-02 MED ORDER — AMLODIPINE BESYLATE 5 MG PO TABS: 5.0000 mg | ORAL_TABLET | Freq: Two times a day (BID) | ORAL | Status: DC

## 2014-08-02 NOTE — Patient Instructions (Signed)
Medication Instructions:  INCREASE AMLODIPINE TO 5 MG TWICE A DAY   Labwork: NONE  Testing/Procedures: NONE  Follow-Up: Your physician recommends that you schedule a follow-up appointment in: 3 MONTH OV

## 2014-08-02 NOTE — Progress Notes (Signed)
Cardiology Office Note   Date:  08/02/2014   ID:  Deborah [name], DOB October 22, 1926, MRN [id number]  PCP:  Warren Danes, MD  Cardiologist: Darlin Coco MD  No chief complaint on file.     History of Present Illness: Deborah [name] is a 81 y.o. female who presents for follow-up for her high blood pressure  This pleasant 81 year old woman is seen back for office visit. . She has a history of previous mechanical aortic valve replacement in 1996 and she has a history of chronic atrial fibrillation and is on Coumadin. She does not have any ischemic heart disease and she had a normal nuclear stress test in March 2012. She has not been experiencing any new cardiac symptoms. She does have a past history of high blood pressure. The patient has a history of intermittent GI bleeding with intermittent melena. She is followed closely by Dr. Watt Climes and by her oncologist Dr. Griffith Citron. Recently she has had worsening anemia. He has had more melena. She had a hemoglobin on 05/09/14 which was down to 6.1 and her INR was greater than 5. Her Coumadin has been on hold for the past several days. She received 2 units of packed cells on 05/09/14 and her energy level has improved slightly. She still complains of lack of energy. He is not having any diarrhea. She has one dark bowel movement a day. Her abdomen has felt tight at times. She has not had any significant weight change. He has noted increased thirst and some blurring of her vision. Recently she has been experiencing awakening with severe headaches at 4 or 5 AM.  Her blood pressure has also been running higher. We checked her machine which she brought from home with our wall cuff and they are relatively close.  Actually her machine reads about 10 mm higher than our wall, cuff Past Medical History  Diagnosis Date  . Hypothyroidism   . Stomach problems     seeing Dr Watt Climes  . Hypertension   . GERD (gastroesophageal reflux disease)   . Anemia    . Arrhythmia     Atrial Fibrillation  . Breast cancer     "right; S/P lumpectomy, chemo, XRT"   . Complication of anesthesia     "once I'm awakened, I don't sleep til the following night"  . High cholesterol   . Heart murmur   . History of blood transfusion 1996; ~ 2013    "related to valve replacement; GI bleeding"   . Chronic lower GI bleeding   . Arthritis     "fingers, toes, hips; qwhere" (05/19/2013)  . Gout   . Pneumonia 07/2011    Past Surgical History  Procedure Laterality Date  . Arteriogram  01/12    NO RAS   . Cardioversion      X 2  . Cardiac catheterization    . Cataract extraction w/ intraocular lens  implant, bilateral Bilateral     bilateral cataract with lens implants  . Cholecystectomy    . Abdominal hysterectomy    . Appendectomy    . Colonoscopy with propofol N/A 11/24/2012    Procedure: COLONOSCOPY WITH PROPOFOL;  Surgeon: Jeryl Columbia, MD;  Location: [facility];  Service: Endoscopy;  Laterality: N/A;  . Breast biopsy Right   . Breast lumpectomy Right   . Aortic valve replacement  1996    St. Jude  . Cardiac valve replacement  1996     Current Outpatient Prescriptions  Medication Sig Dispense  Refill  . acetaminophen (TYLENOL) 500 MG tablet Take 1,000 mg by mouth every 6 (six) hours as needed for headache.    . allopurinol (ZYLOPRIM) 300 MG tablet Take 300 mg by mouth daily.    Marland Kitchen amLODipine (NORVASC) 5 MG tablet Take 1 tablet (5 mg total) by mouth 2 (two) times daily. 180 tablet 3  . calcium-vitamin D 500 MG tablet Take 1 tablet by mouth 2 (two) times daily.      . celecoxib (CELEBREX) 200 MG capsule 1 by mouth every other day 15 capsule 3  . dicyclomine (BENTYL) 10 MG capsule Take 10 mg by mouth daily as needed for spasms.     . digoxin (LANOXIN) 0.125 MG tablet Take 1 tablet (125 mcg total) by mouth daily. 30 tablet 3  . enoxaparin (LOVENOX) 100 MG/ML injection Inject 0.85 mLs (85 mg total) into the skin daily. 30 Syringe 3  . fexofenadine  (ALLEGRA ALLERGY) 180 MG tablet Take 180 mg by mouth daily as needed for allergies.     . furosemide (LASIX) 40 MG tablet Take 40 mg by mouth daily as needed (for swelling).    . hydrochlorothiazide (MICROZIDE) 12.5 MG capsule Take 1 capsule (12.5 mg total) by mouth daily. 90 capsule 3  . levothyroxine (SYNTHROID, LEVOTHROID) 88 MCG tablet Take 88 mcg by mouth daily before breakfast.    . lidocaine-prilocaine (EMLA) cream Apply 1 application topically as needed (prior to infusion at porta cath site).    Marland Kitchen losartan (COZAAR) 50 MG tablet Take 1 tablet (50 mg total) by mouth 2 (two) times daily. 60 tablet 5  . metoprolol tartrate (LOPRESSOR) 25 MG tablet Take 12.5 mg by mouth daily. Take 1/2 tablet by mouth in the morning and 1 tablet by mouth in the evening    . multivitamin (THERAGRAN) per tablet Take 1 tablet by mouth daily.     . Omega-3 Fatty Acids (FISH OIL) 1000 MG CAPS Take 1,000 mg by mouth 2 (two) times daily.     . potassium chloride (KLOR-CON) 20 MEQ packet Take 20 mEq by mouth daily.    . simvastatin (ZOCOR) 20 MG tablet Take 10 mg by mouth daily.    Marland Kitchen zolpidem (AMBIEN CR) 12.5 MG CR tablet Take 12.5 mg by mouth at bedtime as needed for sleep (for sleep).     No current facility-administered medications for this visit.    Allergies:   Amiodarone; Alendronate sodium; Macrodantin; Meclizine; Norvasc; and Tussionex pennkinetic er    Social History:  The patient  reports that she quit smoking about 26 years ago. Her smoking use included Cigarettes. She has a 20 pack-year smoking history. She has never used smokeless tobacco. She reports that she drinks about 4.8 oz of alcohol per week. She reports that she does not use illicit drugs.   Family History:  The patient's family history includes Coronary artery disease in her father; Dementia in her mother.    ROS:  Please see the history of present illness.   Otherwise, review of systems are positive for none.   All other systems are  reviewed and negative.    PHYSICAL EXAM: VS:  BP 136/60 mmHg  Pulse 61  Ht 5' (1.524 m)  Wt 121 lb 12.8 oz (55.248 kg)  BMI 23.79 kg/m2 , BMI Body mass index is 23.79 kg/(m^2). GEN: Well nourished, well developed, in no acute distress HEENT: normal Neck: no JVD, carotid bruits, or masses Cardiac: Irregularly irregular.  There is a grade 2/6 systolic murmur  across the prosthetic aortic mechanical valve.  No aortic insufficiency.  No, rubs, or gallops,no edema  Respiratory:  clear to auscultation bilaterally, normal work of breathing GI: soft, nontender, nondistended, + BS MS: no deformity or atrophy Skin: warm and dry, no rash Neuro:  Strength and sensation are intact Psych: euthymic mood, full affect   EKG:  EKG is not ordered today.    Recent Labs: 04/19/2014: TSH 1.915 05/13/2014: ALT 32 05/24/2014: BUN 36*; Creatinine 0.74; Potassium 4.4; Sodium 134* 07/26/2014: Hemoglobin 11.6; Platelets 176    Lipid Panel    Component Value Date/Time   CHOL 143 04/19/2014 1025   TRIG 108 04/19/2014 1025   HDL 63 04/19/2014 1025   CHOLHDL 2.3 04/19/2014 1025   VLDL 22 04/19/2014 1025   LDLCALC 58 04/19/2014 1025   LDLDIRECT 93.7 04/10/2011 1000      Wt Readings from Last 3 Encounters:  08/02/14 121 lb 12.8 oz (55.248 kg)  07/26/14 121 lb (54.885 kg)  06/13/14 123 lb 4.8 oz (55.929 kg)        ASSESSMENT AND PLAN:  1.  1. Mechanical aortic valve replacement in 1996. 2. essential hypertension 3. chronic atrial fibrillation.  She has done better since switching from warfarin to Lovenox injections 4.  History of occult GI blood loss with iron deficiency anemia, followed by Dr. Watt Climes and Dr. Jana Hakim. 5. Hypothyroidism 6. Hypercholesterolemia   Current medicines are reviewed at length with the patient today.  The patient does not have concerns regarding medicines.  The following changes have been made:  Increase amlodipine to 5 mg twice a day for better blood pressure  control  Labs/ tests ordered today include:  No orders of the defined types were placed in this encounter.     Return in 3 months for follow-up office visit or sooner when necessary.  Berna Spare MD 08/02/2014 7:53 PM    [facility] [address], [place], [place]  [address] Phone: [phone]; Fax: [phone]

## 2014-08-04 ENCOUNTER — Other Ambulatory Visit: Payer: Self-pay | Admitting: Cardiology

## 2014-08-04 DIAGNOSIS — G4700 Insomnia, unspecified: Secondary | ICD-10-CM

## 2014-08-09 ENCOUNTER — Telehealth: Payer: Self-pay | Admitting: Cardiology

## 2014-08-09 NOTE — Telephone Encounter (Signed)
Patient stated yesterday she felt blood pressure was up Headache woke her up at 4:00 am  Blood pressure at 7 am was 175/90 and at 11:00 am 168/84 Has not checked since then but has headache Will forward to  Dr. Mare Ferrari for review

## 2014-08-09 NOTE — Telephone Encounter (Signed)
Advised patient, verbalized understanding  

## 2014-08-09 NOTE — Telephone Encounter (Signed)
Increase hydrochlorothiazide to 25 mg daily

## 2014-08-09 NOTE — Telephone Encounter (Signed)
New message    Patient out of town   Pt c/o BP issue: STAT if pt c/o blurred vision, one-sided weakness or slurred speech  1. What are your last 5 BP readings? This am  175/90 , now  168/84   2. Are you having any other symptoms (ex. Dizziness, headache, blurred vision, passed out)?  Headache   3. What is your BP issue? What should she take.

## 2014-08-11 ENCOUNTER — Ambulatory Visit: Payer: Medicare Other | Admitting: Cardiology

## 2014-08-17 ENCOUNTER — Telehealth: Payer: Self-pay | Admitting: Cardiology

## 2014-08-17 NOTE — Telephone Encounter (Signed)
Pt states that her BP this morning when she got up was 136/75. 9AM- 128/65 12N- 168/85 Next BP checked was 156/85. Pt states that she has been having some issues with dizziness. Denies CP or SOB. Pt states that her eyes have been watery as well and thinks it could be allergies but that she does take an allegra daily. Pt taking HCTZ 12.5 BID, Amlodipine '5mg'$  BID, Cozaar '50mg'$  BID and Metoprolol Tartrate '25mg'$ - 1/2 tab in AM and whole tab in PM. Will forward to Dr. Mare Ferrari for review and follow up.

## 2014-08-17 NOTE — Telephone Encounter (Signed)
Pt c/o BP issue: STAT if pt c/o blurred vision, one-sided weakness or slurred speech  1. What are your last 5 BP readings? 136/75  2. Are you having any other symptoms (ex. Dizziness, headache, blurred vision, passed out)? Dizziness  3. What is your BP issue? Pt's BP is low and she want to know if she can stop taking some of her medications

## 2014-08-18 ENCOUNTER — Other Ambulatory Visit (HOSPITAL_BASED_OUTPATIENT_CLINIC_OR_DEPARTMENT_OTHER): Payer: Medicare Other

## 2014-08-18 ENCOUNTER — Other Ambulatory Visit: Payer: Self-pay | Admitting: *Deleted

## 2014-08-18 DIAGNOSIS — D500 Iron deficiency anemia secondary to blood loss (chronic): Secondary | ICD-10-CM | POA: Diagnosis present

## 2014-08-18 DIAGNOSIS — Z954 Presence of other heart-valve replacement: Secondary | ICD-10-CM

## 2014-08-18 DIAGNOSIS — K2961 Other gastritis with bleeding: Secondary | ICD-10-CM

## 2014-08-18 DIAGNOSIS — D689 Coagulation defect, unspecified: Secondary | ICD-10-CM

## 2014-08-18 DIAGNOSIS — Z7901 Long term (current) use of anticoagulants: Secondary | ICD-10-CM

## 2014-08-18 DIAGNOSIS — I4891 Unspecified atrial fibrillation: Secondary | ICD-10-CM

## 2014-08-18 LAB — CBC & DIFF AND RETIC
BASO%: 0.2 % (ref 0.0–2.0)
Basophils Absolute: 0 10*3/uL (ref 0.0–0.1)
EOS%: 1.3 % (ref 0.0–7.0)
Eosinophils Absolute: 0.1 10*3/uL (ref 0.0–0.5)
HEMATOCRIT: 40.2 % (ref 34.8–46.6)
HEMOGLOBIN: 13.5 g/dL (ref 11.6–15.9)
IMMATURE RETIC FRACT: 7.9 % (ref 1.60–10.00)
LYMPH#: 0.9 10*3/uL (ref 0.9–3.3)
LYMPH%: 20.4 % (ref 14.0–49.7)
MCH: 31.6 pg (ref 25.1–34.0)
MCHC: 33.6 g/dL (ref 31.5–36.0)
MCV: 94.1 fL (ref 79.5–101.0)
MONO#: 0.4 10*3/uL (ref 0.1–0.9)
MONO%: 7.7 % (ref 0.0–14.0)
NEUT#: 3.2 10*3/uL (ref 1.5–6.5)
NEUT%: 70.4 % (ref 38.4–76.8)
NRBC: 0 % (ref 0–0)
Platelets: 167 10*3/uL (ref 145–400)
RBC: 4.27 10*6/uL (ref 3.70–5.45)
RDW: 17 % — ABNORMAL HIGH (ref 11.2–14.5)
RETIC CT ABS: 94.79 10*3/uL — AB (ref 33.70–90.70)
Retic %: 2.22 % — ABNORMAL HIGH (ref 0.70–2.10)
WBC: 4.6 10*3/uL (ref 3.9–10.3)

## 2014-08-18 LAB — FERRITIN CHCC: FERRITIN: 200 ng/mL (ref 9–269)

## 2014-08-18 NOTE — Telephone Encounter (Signed)
Left message to call back  

## 2014-08-19 NOTE — Telephone Encounter (Signed)
Spoke with patient and she was wanting to try to decrease some medications but blood pressure elevated this am Patient stated that she woke up secondary to bad headache, blood pressure systolic 700'F  After about an 1 & 1/2 hours later with medication systolic blood pressure down to 160's Patient takes the same morning medications and evening medications  Will forward to  Dr. Mare Ferrari for review

## 2014-08-19 NOTE — Telephone Encounter (Signed)
Advised patient

## 2014-08-19 NOTE — Telephone Encounter (Signed)
We should have her collect a 24-hour urine for metanephrines to be sure she does not have a pheochromocytoma causing her episodic headaches

## 2014-08-22 NOTE — Telephone Encounter (Signed)
Okay to wait on metanephines for the time being.

## 2014-08-22 NOTE — Telephone Encounter (Signed)
Spoke with Deborah [name] in the lab and he stated he did not have any 24 hour urine containers at this time Spoke with patient and she stated her blood pressure and headaches were good over the weekend, would like to hold off on urine test at this time Patient will continue to monitor blood pressure and call back if elevated or headaches

## 2014-08-30 ENCOUNTER — Other Ambulatory Visit: Payer: Self-pay | Admitting: *Deleted

## 2014-08-30 ENCOUNTER — Telehealth: Payer: Self-pay | Admitting: *Deleted

## 2014-08-30 ENCOUNTER — Telehealth: Payer: Self-pay | Admitting: Oncology

## 2014-08-30 NOTE — Telephone Encounter (Signed)
Called patient and rescheduled her lab to a flush avail time

## 2014-08-30 NOTE — Telephone Encounter (Signed)
VM message from patient requesting to have her port flushed when she has labs on Thursday. POF placed for port flush. Call back to pt. Left VM on identified phone#

## 2014-09-01 ENCOUNTER — Ambulatory Visit (HOSPITAL_BASED_OUTPATIENT_CLINIC_OR_DEPARTMENT_OTHER): Payer: Medicare Other

## 2014-09-01 ENCOUNTER — Other Ambulatory Visit: Payer: Medicare Other

## 2014-09-01 ENCOUNTER — Other Ambulatory Visit: Payer: Self-pay

## 2014-09-01 ENCOUNTER — Other Ambulatory Visit (HOSPITAL_BASED_OUTPATIENT_CLINIC_OR_DEPARTMENT_OTHER): Payer: Medicare Other

## 2014-09-01 VITALS — BP 163/64 | HR 63 | Temp 97.5°F

## 2014-09-01 DIAGNOSIS — D500 Iron deficiency anemia secondary to blood loss (chronic): Secondary | ICD-10-CM

## 2014-09-01 DIAGNOSIS — Z954 Presence of other heart-valve replacement: Secondary | ICD-10-CM

## 2014-09-01 DIAGNOSIS — D689 Coagulation defect, unspecified: Secondary | ICD-10-CM

## 2014-09-01 DIAGNOSIS — Z7901 Long term (current) use of anticoagulants: Secondary | ICD-10-CM

## 2014-09-01 DIAGNOSIS — I4891 Unspecified atrial fibrillation: Secondary | ICD-10-CM

## 2014-09-01 DIAGNOSIS — Z95828 Presence of other vascular implants and grafts: Secondary | ICD-10-CM

## 2014-09-01 DIAGNOSIS — K2961 Other gastritis with bleeding: Secondary | ICD-10-CM

## 2014-09-01 LAB — CBC WITH DIFFERENTIAL/PLATELET
BASO%: 0.2 % (ref 0.0–2.0)
BASOS ABS: 0 10*3/uL (ref 0.0–0.1)
EOS ABS: 0.1 10*3/uL (ref 0.0–0.5)
EOS%: 1.1 % (ref 0.0–7.0)
HCT: 39.3 % (ref 34.8–46.6)
HEMOGLOBIN: 13.4 g/dL (ref 11.6–15.9)
LYMPH%: 20 % (ref 14.0–49.7)
MCH: 32.7 pg (ref 25.1–34.0)
MCHC: 34.1 g/dL (ref 31.5–36.0)
MCV: 95.9 fL (ref 79.5–101.0)
MONO#: 0.3 10*3/uL (ref 0.1–0.9)
MONO%: 6.8 % (ref 0.0–14.0)
NEUT%: 71.9 % (ref 38.4–76.8)
NEUTROS ABS: 3.3 10*3/uL (ref 1.5–6.5)
PLATELETS: 166 10*3/uL (ref 145–400)
RBC: 4.1 10*6/uL (ref 3.70–5.45)
RDW: 16.6 % — ABNORMAL HIGH (ref 11.2–14.5)
WBC: 4.6 10*3/uL (ref 3.9–10.3)
lymph#: 0.9 10*3/uL (ref 0.9–3.3)

## 2014-09-01 LAB — FERRITIN CHCC: Ferritin: 136 ng/ml (ref 9–269)

## 2014-09-01 MED ORDER — HEPARIN SOD (PORK) LOCK FLUSH 100 UNIT/ML IV SOLN
500.0000 [IU] | Freq: Once | INTRAVENOUS | Status: AC
  Administered 2014-09-01: 500 [IU] via INTRAVENOUS
  Filled 2014-09-01: qty 5

## 2014-09-01 MED ORDER — LIDOCAINE-PRILOCAINE 2.5-2.5 % EX CREA: 1.0000 "application " | TOPICAL_CREAM | CUTANEOUS | Status: DC | PRN

## 2014-09-01 MED ORDER — SODIUM CHLORIDE 0.9 % IJ SOLN
10.0000 mL | INTRAMUSCULAR | Status: DC | PRN
  Administered 2014-09-01: 10 mL via INTRAVENOUS
  Filled 2014-09-01: qty 10

## 2014-09-01 NOTE — Patient Instructions (Signed)

## 2014-09-02 ENCOUNTER — Telehealth: Payer: Self-pay | Admitting: Oncology

## 2014-09-02 NOTE — Telephone Encounter (Signed)
Flush appointments added per pof  anne

## 2014-09-05 ENCOUNTER — Telehealth: Payer: Self-pay | Admitting: Cardiology

## 2014-09-05 ENCOUNTER — Other Ambulatory Visit: Payer: Self-pay | Admitting: Cardiology

## 2014-09-05 NOTE — Telephone Encounter (Signed)
Pt c/o BP issue: STAT if pt c/o blurred vision, one-sided weakness or slurred speech  1. What are your last 5 BP readings? 140/69, 78/42, 96/48,   2. Are you having any other symptoms (ex. Dizziness, headache, blurred vision, passed out)? No  3. What is your BP issue? Pt calling stating that her bp is low and wants to know if she still needs to be taking her bp medications. Please call back and advise.

## 2014-09-05 NOTE — Telephone Encounter (Signed)
Spoke with patient and last night her systolic blood pressure below 100 so she did not take her Norvasc Patient was asymptomatic  This am before her morning medications blood pressure 140/69 so she took her regular medications  Per  Dr. Mare Ferrari ok to hold evening dose of Norvasc if systolic less than 185 Advised patient, verbalized understanding

## 2014-09-06 NOTE — Telephone Encounter (Signed)
Please advise on refill as I do not see a recent tsh. Thanks, MI

## 2014-09-07 ENCOUNTER — Telehealth: Payer: Self-pay | Admitting: *Deleted

## 2014-09-07 DIAGNOSIS — E039 Hypothyroidism, unspecified: Secondary | ICD-10-CM

## 2014-09-07 NOTE — Telephone Encounter (Signed)
Refilled Synthroid  Order in for TSH/Ft4 at next [facility]

## 2014-09-07 NOTE — Telephone Encounter (Signed)
Pharmacy called about needing an rx for patients synthroid. I do not see that she has had a recent tsh. Ok to refill? Please advise. Thanks, MI

## 2014-09-12 ENCOUNTER — Other Ambulatory Visit: Payer: Self-pay | Admitting: Cardiology

## 2014-09-14 ENCOUNTER — Telehealth: Payer: Self-pay | Admitting: *Deleted

## 2014-09-14 NOTE — Telephone Encounter (Signed)
Patient states that she is having a prolonged cough and would like to see the symptom management NP. Cyndee Berniece Salines notified and POF sent to scheduler.

## 2014-09-15 ENCOUNTER — Other Ambulatory Visit (HOSPITAL_BASED_OUTPATIENT_CLINIC_OR_DEPARTMENT_OTHER): Payer: Medicare Other

## 2014-09-15 ENCOUNTER — Other Ambulatory Visit: Payer: Self-pay | Admitting: Nurse Practitioner

## 2014-09-15 ENCOUNTER — Telehealth: Payer: Self-pay | Admitting: Cardiology

## 2014-09-15 ENCOUNTER — Ambulatory Visit (HOSPITAL_BASED_OUTPATIENT_CLINIC_OR_DEPARTMENT_OTHER): Payer: Medicare Other | Admitting: Nurse Practitioner

## 2014-09-15 ENCOUNTER — Telehealth: Payer: Self-pay | Admitting: *Deleted

## 2014-09-15 ENCOUNTER — Encounter: Payer: Self-pay | Admitting: Nurse Practitioner

## 2014-09-15 VITALS — BP 159/66 | HR 73 | Temp 97.6°F | Resp 16 | Wt 124.5 lb

## 2014-09-15 DIAGNOSIS — K922 Gastrointestinal hemorrhage, unspecified: Secondary | ICD-10-CM

## 2014-09-15 DIAGNOSIS — D500 Iron deficiency anemia secondary to blood loss (chronic): Secondary | ICD-10-CM

## 2014-09-15 DIAGNOSIS — K2961 Other gastritis with bleeding: Secondary | ICD-10-CM

## 2014-09-15 DIAGNOSIS — Z7901 Long term (current) use of anticoagulants: Secondary | ICD-10-CM | POA: Diagnosis not present

## 2014-09-15 DIAGNOSIS — J40 Bronchitis, not specified as acute or chronic: Secondary | ICD-10-CM | POA: Diagnosis present

## 2014-09-15 DIAGNOSIS — I4891 Unspecified atrial fibrillation: Secondary | ICD-10-CM

## 2014-09-15 DIAGNOSIS — D689 Coagulation defect, unspecified: Secondary | ICD-10-CM

## 2014-09-15 DIAGNOSIS — Z954 Presence of other heart-valve replacement: Secondary | ICD-10-CM

## 2014-09-15 LAB — CBC WITH DIFFERENTIAL/PLATELET
BASO%: 0.6 % (ref 0.0–2.0)
Basophils Absolute: 0 10*3/uL (ref 0.0–0.1)
EOS%: 0.9 % (ref 0.0–7.0)
Eosinophils Absolute: 0 10*3/uL (ref 0.0–0.5)
HCT: 38.3 % (ref 34.8–46.6)
HGB: 12.7 g/dL (ref 11.6–15.9)
LYMPH#: 0.4 10*3/uL — AB (ref 0.9–3.3)
LYMPH%: 12.3 % — AB (ref 14.0–49.7)
MCH: 32 pg (ref 25.1–34.0)
MCHC: 33.1 g/dL (ref 31.5–36.0)
MCV: 96.6 fL (ref 79.5–101.0)
MONO#: 0.4 10*3/uL (ref 0.1–0.9)
MONO%: 13 % (ref 0.0–14.0)
NEUT%: 73.2 % (ref 38.4–76.8)
NEUTROS ABS: 2.4 10*3/uL (ref 1.5–6.5)
Platelets: 139 10*3/uL — ABNORMAL LOW (ref 145–400)
RBC: 3.96 10*6/uL (ref 3.70–5.45)
RDW: 19.1 % — AB (ref 11.2–14.5)
WBC: 3.3 10*3/uL — ABNORMAL LOW (ref 3.9–10.3)

## 2014-09-15 MED ORDER — AZITHROMYCIN 250 MG PO TABS
ORAL_TABLET | ORAL | Status: DC
Start: 2014-09-15 — End: 2014-11-22

## 2014-09-15 MED ORDER — ALBUTEROL SULFATE HFA 108 (90 BASE) MCG/ACT IN AERS: 1.0000 | INHALATION_SPRAY | Freq: Four times a day (QID) | RESPIRATORY_TRACT | Status: DC | PRN

## 2014-09-15 NOTE — Progress Notes (Signed)
SYMPTOM MANAGEMENT CLINIC   HPI: Deborah [name] 81 y.o. female diagnosed with iron deficiency anemia.  Patient has a history of chronic GI bleed that has been extensively evaluated in the past; most likely secondary to AVM, diverticular disease, hemorrhoids, and gastric polyps.  Last ferritin infusion was 07/22/2014.    Patient reports developing a congested, occasionally productive cough with yellow secretions this past Sunday, 09/11/2014.  She is also complaining of a mild increased shortness of breath with exertion only.  She denies any fever; but does admit to some chills recently.  HPI  ROS  Past Medical History  Diagnosis Date  . Hypothyroidism   . Stomach problems     seeing Dr Watt Climes  . Hypertension   . GERD (gastroesophageal reflux disease)   . Anemia   . Arrhythmia     Atrial Fibrillation  . Breast cancer     "right; S/P lumpectomy, chemo, XRT"   . Complication of anesthesia     "once I'm awakened, I don't sleep til the following night"  . High cholesterol   . Heart murmur   . History of blood transfusion 1996; ~ 2013    "related to valve replacement; GI bleeding"   . Chronic lower GI bleeding   . Arthritis     "fingers, toes, hips; qwhere" (05/19/2013)  . Gout   . Pneumonia 07/2011    Past Surgical History  Procedure Laterality Date  . Arteriogram  01/12    NO RAS   . Cardioversion      X 2  . Cardiac catheterization    . Cataract extraction w/ intraocular lens  implant, bilateral Bilateral     bilateral cataract with lens implants  . Cholecystectomy    . Abdominal hysterectomy    . Appendectomy    . Colonoscopy with propofol N/A 11/24/2012    Procedure: COLONOSCOPY WITH PROPOFOL;  Surgeon: Jeryl Columbia, MD;  Location: [facility];  Service: Endoscopy;  Laterality: N/A;  . Breast biopsy Right   . Breast lumpectomy Right   . Aortic valve replacement  1996    [employer]  . Cardiac valve replacement  1996    has Hypothyroidism; Benign hypertensive  heart disease without heart failure; S/P aortic valve replacement with metallic valve; Hypokalemia; Hyponatremia; Bronchitis; Nonsustained ventricular tachycardia; Long term current use of anticoagulant therapy; Atrial fibrillation; Dyslipidemia; Malaise and fatigue; Iron deficiency anemia due to chronic blood loss; Coagulopathy; Melena; GI bleed; Poor venous access; and Pain on her problem list.    is allergic to amiodarone; alendronate sodium; macrodantin; meclizine; norvasc; and tussionex pennkinetic er.    Medication List       This list is accurate as of: 09/15/14 11:57 AM.  Always use your most recent med list.               acetaminophen 500 MG tablet  Commonly known as:  TYLENOL  Take 1,000 mg by mouth every 6 (six) hours as needed for headache.     albuterol 108 (90 BASE) MCG/ACT inhaler  Commonly known as:  PROVENTIL HFA;VENTOLIN HFA  Inhale 1-2 puffs into the lungs every 6 (six) hours as needed for wheezing or shortness of breath.     ALLEGRA ALLERGY 180 MG tablet  Generic drug:  fexofenadine  Take 180 mg by mouth daily as needed for allergies.     allopurinol 300 MG tablet  Commonly known as:  ZYLOPRIM  Take 300 mg by mouth daily.     amLODipine  5 MG tablet  Commonly known as:  NORVASC  Take 1 tablet (5 mg total) by mouth 2 (two) times daily.     azithromycin 250 MG tablet  Commonly known as:  ZITHROMAX Z-PAK  Take 2 tabs (500 mg) PO on day # 1; then take 1 tab (250 mg) PO QD till gone.     BENTYL 10 MG capsule  Generic drug:  dicyclomine  Take 10 mg by mouth daily as needed for spasms.     calcium-vitamin D 500 MG tablet  Take 1 tablet by mouth 2 (two) times daily.     celecoxib 200 MG capsule  Commonly known as:  CELEBREX  1 by mouth every other day     digoxin 0.125 MG tablet  Commonly known as:  LANOXIN  Take 1 tablet (125 mcg total) by mouth daily.     enoxaparin 100 MG/ML injection  Commonly known as:  LOVENOX  Inject 0.85 mLs (85 mg total) into  the skin daily.     Fish Oil 1000 MG Caps  Take 1,000 mg by mouth 2 (two) times daily.     furosemide 40 MG tablet  Commonly known as:  LASIX  Take 40 mg by mouth daily as needed (for swelling).     hydrochlorothiazide 12.5 MG capsule  Commonly known as:  MICROZIDE  Take 1 capsule (12.5 mg total) by mouth daily.     levothyroxine 88 MCG tablet  Commonly known as:  SYNTHROID, LEVOTHROID  Take 88 mcg by mouth daily before breakfast.     lidocaine-prilocaine cream  Commonly known as:  EMLA  Apply 1 application topically as needed (prior to infusion at porta cath site).     losartan 50 MG tablet  Commonly known as:  COZAAR  Take 1 tablet (50 mg total) by mouth 2 (two) times daily.     metoprolol tartrate 25 MG tablet  Commonly known as:  LOPRESSOR  Take 12.5 mg by mouth daily. Take 1/2 tablet by mouth in the morning and 1 tablet by mouth in the evening     multivitamin per tablet  Take 1 tablet by mouth daily.     potassium chloride 20 MEQ packet  Commonly known as:  KLOR-CON  Take 20 mEq by mouth daily.     simvastatin 20 MG tablet  Commonly known as:  ZOCOR  Take 10 mg by mouth daily.     zolpidem 12.5 MG CR tablet  Commonly known as:  AMBIEN CR  TAKE ONE TABLET AT BEDTIME AS NEEDED         PHYSICAL EXAMINATION  Oncology Vitals 09/15/2014 09/01/2014 08/02/2014 07/26/2014 07/15/2014 07/15/2014 06/29/2014  Height - - 152 cm 152 cm - - -  Weight 56.473 kg - 55.248 kg 54.885 kg - - -  Weight (lbs) 124 lbs 8 oz - 121 lbs 13 oz 121 lbs - - -  BMI (kg/m2) - - 23.79 kg/m2 23.63 kg/m2 - - -  Temp 97.6 97.5 - 98 - 97.9 98.1  Pulse 73 63 61 67 70 64 74  Resp 16 - - 18 - - 18  SpO2 94 98 - - - - 99  BSA (m2) - - 1.53 m2 1.52 m2 - - -   BP Readings from Last 3 Encounters:  09/15/14 159/66  09/01/14 163/64  08/02/14 136/60    Physical Exam  Constitutional: She is oriented to person, place, and time and well-developed, well-nourished, and in no distress.  HENT:  Head:  Normocephalic and atraumatic.  Mouth/Throat: Oropharynx is clear and moist.  Eyes: Conjunctivae and EOM are normal. Pupils are equal, round, and reactive to light. Right eye exhibits no discharge. Left eye exhibits no discharge. No scleral icterus.  Neck: Normal range of motion.  Cardiovascular: Normal rate, normal heart sounds and intact distal pulses.   Pulmonary/Chest: Breath sounds normal. No stridor. No respiratory distress. She has no wheezes. She has no rales. She exhibits no tenderness.  Bilateral breath sounds essentially clear with no cough or wheeze.  Patient does appear mildly breathless with a conversation.  No acute respiratory distress.  Musculoskeletal: Normal range of motion.  Neurological: She is alert and oriented to person, place, and time. Gait normal.  Skin: Skin is warm and dry.  Psychiatric: Affect normal.  Nursing note and vitals reviewed.   LABORATORY DATA:. Appointment on 09/15/2014  Component Date Value Ref Range Status  . WBC 09/15/2014 3.3* 3.9 - 10.3 10e3/uL Final  . NEUT# 09/15/2014 2.4  1.5 - 6.5 10e3/uL Final  . HGB 09/15/2014 12.7  11.6 - 15.9 g/dL Final  . HCT 09/15/2014 38.3  34.8 - 46.6 % Final  . Platelets 09/15/2014 139* 145 - 400 10e3/uL Final  . MCV 09/15/2014 96.6  79.5 - 101.0 fL Final  . MCH 09/15/2014 32.0  25.1 - 34.0 pg Final  . MCHC 09/15/2014 33.1  31.5 - 36.0 g/dL Final  . RBC 09/15/2014 3.96  3.70 - 5.45 10e6/uL Final  . RDW 09/15/2014 19.1* 11.2 - 14.5 % Final  . lymph# 09/15/2014 0.4* 0.9 - 3.3 10e3/uL Final  . MONO# 09/15/2014 0.4  0.1 - 0.9 10e3/uL Final  . Eosinophils Absolute 09/15/2014 0.0  0.0 - 0.5 10e3/uL Final  . Basophils Absolute 09/15/2014 0.0  0.0 - 0.1 10e3/uL Final  . NEUT% 09/15/2014 73.2  38.4 - 76.8 % Final  . LYMPH% 09/15/2014 12.3* 14.0 - 49.7 % Final  . MONO% 09/15/2014 13.0  0.0 - 14.0 % Final  . EOS% 09/15/2014 0.9  0.0 - 7.0 % Final  . BASO% 09/15/2014 0.6  0.0 - 2.0 % Final     RADIOGRAPHIC STUDIES:  No results found.  ASSESSMENT/PLAN:    Bronchitis Patient reports developing a congested, occasionally productive cough with yellow secretions this past Sunday, 09/11/2014.  She is also complaining of a mild increased shortness of breath with exertion only.  She denies any fever; but does admit to some chills recently.  On exam.-Breath sounds essentially clear; the patient does appear slightly breathless with conversation.  There are no wheezes.  No active coughing on exam.  Patient was afebrile while at the cancer center.  After discussion with both patient and her family member-will prescribe Zithromax for probable bronchitis versus initial stages of pneumonia.  Will also prescribe an albuterol inhaler for the patient to try.  Patient states that she has taken Zithromax in the past per her primary care physician Dr. Mare Ferrari.  The administration of Zithromax can increase digoxin concentration; and have called to review with patient's primary care provider so he can be aware and manage for any issues.  Patient was advised to call/return directly to the emergency department for any worsening symptoms whatsoever.  Long term current use of anticoagulant therapy Patient has a history of chronic atrial fib, and an artificial heart valve.  She takes both digoxin and Lovenox on a daily basis.  Iron deficiency anemia due to chronic blood loss Patient has a history of chronic GI bleed that has been extensively  evaluated in the past; most likely secondary to AVM, diverticular disease, hemorrhoids, and gastric polyps.  Last ferritin infusion was 07/22/2014.  Hemoglobin today is 12.7.  Patient will continue to receive Feraheme infusions at 13 drops below 100; and will receive blood transfusions for hemoglobins less than 9.0.  Patient has been having labs drawn on an every two-week basis; but is requesting to increase the length between her blood draws to once every month.  Advised patient would review  this request with Dr. Jana Hakim; and let her know.  Currently scheduled-is labs and flush on 09/29/2014; and labs and flush on 10/13/2014.    Patient stated understanding of all instructions; and was in agreement with this plan of care. The patient knows to call the clinic with any problems, questions or concerns.   Review/collaboration with Dr. Jana Hakim regarding all aspects of patient's visit today.   Total time spent with patient was 25 minutes;  with greater than 75 percent of that time spent in face to face counseling regarding patient's symptoms,  and coordination of care and follow up.  Disclaimer: This note was dictated with voice recognition software. Similar sounding words can inadvertently be transcribed and may not be corrected upon review.   Drue Second, NP 09/15/2014

## 2014-09-15 NOTE — Telephone Encounter (Signed)
LM for Dr. Mare Ferrari to advise pt has been prescribed a Z-pak today as treatment for a cough.

## 2014-09-15 NOTE — Assessment & Plan Note (Signed)
Patient reports developing a congested, occasionally productive cough with yellow secretions this past Sunday, 09/11/2014.  She is also complaining of a mild increased shortness of breath with exertion only.  She denies any fever; but does admit to some chills recently.  On exam.-Breath sounds essentially clear; the patient does appear slightly breathless with conversation.  There are no wheezes.  No active coughing on exam.  Patient was afebrile while at the cancer center.  After discussion with both patient and her family member-will prescribe Zithromax for probable bronchitis versus initial stages of pneumonia.  Will also prescribe an albuterol inhaler for the patient to try.  Patient states that she has taken Zithromax in the past per her primary care physician Dr. Mare Ferrari.  The administration of Zithromax can increase digoxin concentration; and have called to review with patient's primary care provider so he can be aware and manage for any issues.  Patient was advised to call/return directly to the emergency department for any worsening symptoms whatsoever.

## 2014-09-15 NOTE — Telephone Encounter (Signed)
New Prob     Pt was prescribed a Z-Pak. Calling to make Dr. Mare Ferrari aware.

## 2014-09-15 NOTE — Telephone Encounter (Signed)
Okay to use Zpak

## 2014-09-15 NOTE — Assessment & Plan Note (Signed)
Patient has a history of chronic atrial fib, and an artificial heart valve.  She takes both digoxin and Lovenox on a daily basis.

## 2014-09-15 NOTE — Assessment & Plan Note (Signed)
Patient has a history of chronic GI bleed that has been extensively evaluated in the past; most likely secondary to AVM, diverticular disease, hemorrhoids, and gastric polyps.  Last ferritin infusion was 07/22/2014.  Hemoglobin today is 12.7.  Patient will continue to receive Feraheme infusions at 13 drops below 100; and will receive blood transfusions for hemoglobins less than 9.0.  Patient has been having labs drawn on an every two-week basis; but is requesting to increase the length between her blood draws to once every month.  Advised patient would review this request with Dr. Jana Hakim; and let her know.  Currently scheduled-is labs and flush on 09/29/2014; and labs and flush on 10/13/2014.

## 2014-09-15 NOTE — Telephone Encounter (Signed)
Received message from the cancer center [facility], [phone]) that the patient was placed on a Z pack

## 2014-09-19 ENCOUNTER — Telehealth: Payer: Self-pay | Admitting: Cardiology

## 2014-09-19 NOTE — Telephone Encounter (Signed)
New Message  Pt wanted to speak w. RN about pt's current condition (upper respiratory trouble) and current medication. Please call back and discuss.

## 2014-09-19 NOTE — Telephone Encounter (Signed)
Patient currently being treated for URI with a Zpak, takes last dose today Patient is taking Mucinex every 12 hours Unsure if she has a fever but does not think so Productive cough, just started getting up mucus today Advised patient that Zpak will stay in system up to 5 days after completing, continue Mucinex. Call back if develops fever or no better Patient verbalized understanding  Discussed with  Dr. Mare Ferrari and he agreed with advice given

## 2014-09-21 ENCOUNTER — Other Ambulatory Visit: Payer: Self-pay | Admitting: *Deleted

## 2014-09-21 ENCOUNTER — Telehealth: Payer: Self-pay | Admitting: Cardiology

## 2014-09-21 MED ORDER — DOXYCYCLINE HYCLATE 100 MG PO CAPS: 100.0000 mg | ORAL_CAPSULE | Freq: Two times a day (BID) | ORAL | Status: DC

## 2014-09-21 MED ORDER — HYDROCHLOROTHIAZIDE 12.5 MG PO CAPS: 12.5000 mg | ORAL_CAPSULE | Freq: Two times a day (BID) | ORAL | Status: DC

## 2014-09-21 NOTE — Telephone Encounter (Signed)
Switch to doxycycline 100 mg twice a day for 7 days

## 2014-09-21 NOTE — Telephone Encounter (Signed)
Patient states she is still not feeling better Patient states she is coughing and blowing out yellow sputum Will forward to  Dr. Mare Ferrari for review

## 2014-09-21 NOTE — Telephone Encounter (Signed)
Advised patient and sent to pharmacy  

## 2014-09-21 NOTE — Telephone Encounter (Signed)
New message     Patient calling C/O upper respiratory . Cough up yellow.  Wheezing.    Does not feel well.

## 2014-09-23 ENCOUNTER — Telehealth: Payer: Self-pay | Admitting: Cardiology

## 2014-09-23 NOTE — Telephone Encounter (Signed)
New message      Pt c/o medication issue:  1. Name of Medication: doxycycline 2. How are you currently taking this medication (dosage and times per day)?  '100mg'$   3. Are you having a reaction (difficulty breathing--STAT)? no 4. What is your medication issue?  Pt says she has diarrhea and is afraid to start the doxycycline.  Can she take imodimal?  She was supposed to start doxycycline yesterday and take on an empty stomach but she afraid.  Please advise

## 2014-09-23 NOTE — Telephone Encounter (Signed)
Spoke with patient earlier today after discussing with  Dr. Mare Ferrari  Per  Dr. Mare Ferrari ok to take the Imodium as needed but he would recommend holding off on Doxycycline for now Patient took last Zithromax on Monday so still should be working  Call back Monday with update. If worse go to Urgent Care over the weekend Advised patient, verbalized understanding

## 2014-09-26 ENCOUNTER — Other Ambulatory Visit: Payer: Self-pay | Admitting: *Deleted

## 2014-09-26 ENCOUNTER — Other Ambulatory Visit: Payer: Self-pay

## 2014-09-26 ENCOUNTER — Other Ambulatory Visit: Payer: Self-pay | Admitting: Oncology

## 2014-09-27 ENCOUNTER — Telehealth: Payer: Self-pay | Admitting: Oncology

## 2014-09-27 NOTE — Telephone Encounter (Signed)
Patient was not answering phone. Mailed calendar for change lab from 07/07 to 07/12.

## 2014-09-29 ENCOUNTER — Other Ambulatory Visit: Payer: Medicare Other

## 2014-10-06 ENCOUNTER — Other Ambulatory Visit: Payer: Medicare Other

## 2014-10-11 ENCOUNTER — Other Ambulatory Visit (HOSPITAL_BASED_OUTPATIENT_CLINIC_OR_DEPARTMENT_OTHER): Payer: Medicare Other

## 2014-10-11 ENCOUNTER — Ambulatory Visit (HOSPITAL_BASED_OUTPATIENT_CLINIC_OR_DEPARTMENT_OTHER): Payer: Medicare Other

## 2014-10-11 VITALS — BP 151/100 | HR 72 | Temp 97.6°F

## 2014-10-11 DIAGNOSIS — D689 Coagulation defect, unspecified: Secondary | ICD-10-CM

## 2014-10-11 DIAGNOSIS — D500 Iron deficiency anemia secondary to blood loss (chronic): Secondary | ICD-10-CM

## 2014-10-11 DIAGNOSIS — I4891 Unspecified atrial fibrillation: Secondary | ICD-10-CM

## 2014-10-11 DIAGNOSIS — K2961 Other gastritis with bleeding: Secondary | ICD-10-CM

## 2014-10-11 DIAGNOSIS — Z95828 Presence of other vascular implants and grafts: Secondary | ICD-10-CM

## 2014-10-11 DIAGNOSIS — Z7901 Long term (current) use of anticoagulants: Secondary | ICD-10-CM

## 2014-10-11 DIAGNOSIS — Z954 Presence of other heart-valve replacement: Secondary | ICD-10-CM

## 2014-10-11 LAB — CBC & DIFF AND RETIC
BASO%: 0.2 % (ref 0.0–2.0)
BASOS ABS: 0 10*3/uL (ref 0.0–0.1)
EOS%: 1.3 % (ref 0.0–7.0)
Eosinophils Absolute: 0.1 10*3/uL (ref 0.0–0.5)
HCT: 38 % (ref 34.8–46.6)
HGB: 12.7 g/dL (ref 11.6–15.9)
IMMATURE RETIC FRACT: 5.3 % (ref 1.60–10.00)
LYMPH%: 13.8 % — AB (ref 14.0–49.7)
MCH: 33 pg (ref 25.1–34.0)
MCHC: 33.4 g/dL (ref 31.5–36.0)
MCV: 98.7 fL (ref 79.5–101.0)
MONO#: 0.3 10*3/uL (ref 0.1–0.9)
MONO%: 6.5 % (ref 0.0–14.0)
NEUT#: 3.6 10*3/uL (ref 1.5–6.5)
NEUT%: 78.2 % — AB (ref 38.4–76.8)
Platelets: 175 10*3/uL (ref 145–400)
RBC: 3.85 10*6/uL (ref 3.70–5.45)
RDW: 15.7 % — ABNORMAL HIGH (ref 11.2–14.5)
RETIC %: 2.19 % — AB (ref 0.70–2.10)
Retic Ct Abs: 84.32 10*3/uL (ref 33.70–90.70)
WBC: 4.6 10*3/uL (ref 3.9–10.3)
lymph#: 0.6 10*3/uL — ABNORMAL LOW (ref 0.9–3.3)

## 2014-10-11 LAB — FERRITIN CHCC: FERRITIN: 107 ng/mL (ref 9–269)

## 2014-10-11 MED ORDER — HEPARIN SOD (PORK) LOCK FLUSH 100 UNIT/ML IV SOLN
500.0000 [IU] | Freq: Once | INTRAVENOUS | Status: AC
  Administered 2014-10-11: 500 [IU] via INTRAVENOUS
  Filled 2014-10-11: qty 5

## 2014-10-11 MED ORDER — SODIUM CHLORIDE 0.9 % IJ SOLN
10.0000 mL | INTRAMUSCULAR | Status: DC | PRN
  Administered 2014-10-11: 10 mL via INTRAVENOUS
  Filled 2014-10-11: qty 10

## 2014-10-11 NOTE — Patient Instructions (Signed)

## 2014-10-12 ENCOUNTER — Other Ambulatory Visit: Payer: Self-pay

## 2014-10-12 ENCOUNTER — Telehealth: Payer: Self-pay | Admitting: Cardiology

## 2014-10-12 ENCOUNTER — Telehealth: Payer: Self-pay

## 2014-10-12 MED ORDER — CELECOXIB 200 MG PO CAPS: ORAL_CAPSULE | ORAL | Status: DC

## 2014-10-12 NOTE — Telephone Encounter (Signed)
New message   Pt c/o BP issue: STAT if pt c/o blurred vision, one-sided weakness or slurred speech  1. What are your last 5 BP readings? 189/99 today @ 7:30; pulse 76  2. Are you having any other symptoms (ex. Dizziness, headache, blurred vision, passed out)? headache  3. What is your BP issue? B/p is running higher than normal    [place] and in a lot of pain in back and leg. Please call to advise

## 2014-10-12 NOTE — Telephone Encounter (Signed)
Spoke with pt and she states that she [place]. Pt states that she has been having pain in her back and legs. Pt states she went for xrays yesterday and they were negative for breaks or fractures. Pt states that for the last 2 mornings she has woke up with a terrible headache. Pt states BP at 7:30AM was 189/99, HR 76 and she took meds right after checking. Asked pt to recheck vitals since she had taken her medications and BP is now 163/74, HR 67 and headache has subsided. Pt denies CP, SOB, visual disturbances, dizziness or lightheadedness. Pt states that she was prescribed Vicodin 5/325 every 12 hours as needed for pain. Pt states that she plans to only take a 1/2 tab because she doesn't really like to take these types of medications. Pt feels that BP may be up due to her pain. Informed pt that I would route this information to Dr. Mare Ferrari for review and advisement on BP.

## 2014-10-12 NOTE — Telephone Encounter (Signed)
Refill of Celebrex done.

## 2014-10-13 ENCOUNTER — Other Ambulatory Visit: Payer: Medicare Other

## 2014-10-13 ENCOUNTER — Telehealth: Payer: Self-pay | Admitting: *Deleted

## 2014-10-13 MED ORDER — CELECOXIB 200 MG PO CAPS: 200.0000 mg | ORAL_CAPSULE | Freq: Every day | ORAL | Status: DC

## 2014-10-13 MED ORDER — CELECOXIB 200 MG PO CAPS: ORAL_CAPSULE | ORAL | Status: DC

## 2014-10-13 NOTE — Telephone Encounter (Signed)
Pharmacist from [facility] called and stated that the patient is requesting the the celebrex be approved for one qd. Please advise. Thanks, MI

## 2014-10-13 NOTE — Telephone Encounter (Signed)
Pt c/o medication issue:  1. Name of Medication: Several BP medications  2. How are you currently taking this medication (dosage and times per day)? Unknown  3. Are you having a reaction (difficulty breathing--STAT)? No  4. What is your medication issue? Pt calling stating that she fell and is in a lot of pain and her BP is high and wants to know if she needs to increase the amount of BP medications she is taking. Please call back and advise.

## 2014-10-13 NOTE — Telephone Encounter (Signed)
Spoke with patient and she has seen her orthopedist and nothing is broken from recent fll  Patient has been taking her Celebrex 200 mg daily, would like new Rx sent to [facility]  Discussed Celebrex and blood pressure with  Dr. Pearline Cables for patient to take Celebrex daily Increase Metoprolol to full tablet twice daily Advised patient, verbalized understanding

## 2014-10-13 NOTE — Telephone Encounter (Signed)
Apparently she felt that her pain was causing her blood pressure to be elevated.  Please call and: Check on her.

## 2014-10-13 NOTE — Telephone Encounter (Signed)
Rx called to pharmacy at one tablet daily

## 2014-10-18 ENCOUNTER — Telehealth: Payer: Self-pay

## 2014-10-18 NOTE — Telephone Encounter (Signed)
Deborah [name] is 40.66 first time. Plan paid [phone]. The next bill was 324.77 and plan paid 285.14. This is for lovenox. Why the difference? Can we help?

## 2014-10-27 ENCOUNTER — Other Ambulatory Visit: Payer: Medicare Other

## 2014-10-31 ENCOUNTER — Other Ambulatory Visit: Payer: Self-pay | Admitting: Cardiology

## 2014-11-03 ENCOUNTER — Other Ambulatory Visit: Payer: Self-pay | Admitting: Orthopaedic Surgery

## 2014-11-03 DIAGNOSIS — M545 Low back pain: Secondary | ICD-10-CM

## 2014-11-08 ENCOUNTER — Ambulatory Visit
Admission: RE | Admit: 2014-11-08 | Discharge: 2014-11-08 | Disposition: A | Discharge status: Home / Self Care | Payer: Medicare Other | Source: Ambulatory Visit | Attending: Orthopaedic Surgery | Admitting: Orthopaedic Surgery

## 2014-11-08 DIAGNOSIS — M545 Low back pain: Secondary | ICD-10-CM

## 2014-11-10 ENCOUNTER — Other Ambulatory Visit: Payer: Medicare Other

## 2014-11-15 ENCOUNTER — Other Ambulatory Visit: Payer: Self-pay | Admitting: Cardiology

## 2014-11-17 ENCOUNTER — Other Ambulatory Visit (HOSPITAL_BASED_OUTPATIENT_CLINIC_OR_DEPARTMENT_OTHER): Payer: Medicare Other

## 2014-11-17 ENCOUNTER — Ambulatory Visit (HOSPITAL_BASED_OUTPATIENT_CLINIC_OR_DEPARTMENT_OTHER): Payer: Medicare Other

## 2014-11-17 DIAGNOSIS — Z7901 Long term (current) use of anticoagulants: Secondary | ICD-10-CM

## 2014-11-17 DIAGNOSIS — Z954 Presence of other heart-valve replacement: Secondary | ICD-10-CM

## 2014-11-17 DIAGNOSIS — D500 Iron deficiency anemia secondary to blood loss (chronic): Secondary | ICD-10-CM

## 2014-11-17 DIAGNOSIS — K922 Gastrointestinal hemorrhage, unspecified: Secondary | ICD-10-CM

## 2014-11-17 DIAGNOSIS — Z95828 Presence of other vascular implants and grafts: Secondary | ICD-10-CM

## 2014-11-17 DIAGNOSIS — I4891 Unspecified atrial fibrillation: Secondary | ICD-10-CM

## 2014-11-17 DIAGNOSIS — K2961 Other gastritis with bleeding: Secondary | ICD-10-CM

## 2014-11-17 DIAGNOSIS — D689 Coagulation defect, unspecified: Secondary | ICD-10-CM

## 2014-11-17 LAB — CBC & DIFF AND RETIC
BASO%: 0.3 % (ref 0.0–2.0)
Basophils Absolute: 0 10*3/uL (ref 0.0–0.1)
EOS%: 1 % (ref 0.0–7.0)
Eosinophils Absolute: 0 10*3/uL (ref 0.0–0.5)
HCT: 35.1 % (ref 34.8–46.6)
HGB: 11.9 g/dL (ref 11.6–15.9)
IMMATURE RETIC FRACT: 7.2 % (ref 1.60–10.00)
LYMPH#: 0.5 10*3/uL — AB (ref 0.9–3.3)
LYMPH%: 13.6 % — ABNORMAL LOW (ref 14.0–49.7)
MCH: 34.1 pg — ABNORMAL HIGH (ref 25.1–34.0)
MCHC: 33.9 g/dL (ref 31.5–36.0)
MCV: 100.6 fL (ref 79.5–101.0)
MONO#: 0.4 10*3/uL (ref 0.1–0.9)
MONO%: 10.1 % (ref 0.0–14.0)
NEUT%: 75 % (ref 38.4–76.8)
NEUTROS ABS: 3 10*3/uL (ref 1.5–6.5)
PLATELETS: 162 10*3/uL (ref 145–400)
RBC: 3.49 10*6/uL — AB (ref 3.70–5.45)
RDW: 13.8 % (ref 11.2–14.5)
RETIC CT ABS: 112.73 10*3/uL — AB (ref 33.70–90.70)
Retic %: 3.23 % — ABNORMAL HIGH (ref 0.70–2.10)
WBC: 4 10*3/uL (ref 3.9–10.3)

## 2014-11-17 LAB — FERRITIN CHCC: FERRITIN: 78 ng/mL (ref 9–269)

## 2014-11-17 MED ORDER — HEPARIN SOD (PORK) LOCK FLUSH 100 UNIT/ML IV SOLN
500.0000 [IU] | Freq: Once | INTRAVENOUS | Status: AC
  Administered 2014-11-17: 500 [IU] via INTRAVENOUS
  Filled 2014-11-17: qty 5

## 2014-11-17 MED ORDER — SODIUM CHLORIDE 0.9 % IJ SOLN
10.0000 mL | INTRAMUSCULAR | Status: DC | PRN
  Administered 2014-11-17: 10 mL via INTRAVENOUS
  Filled 2014-11-17: qty 10

## 2014-11-17 NOTE — Patient Instructions (Signed)

## 2014-11-21 ENCOUNTER — Other Ambulatory Visit: Payer: Self-pay | Admitting: Cardiology

## 2014-11-22 ENCOUNTER — Other Ambulatory Visit: Payer: Medicare Other

## 2014-11-22 ENCOUNTER — Ambulatory Visit (INDEPENDENT_AMBULATORY_CARE_PROVIDER_SITE_OTHER): Payer: Medicare Other | Admitting: Cardiology

## 2014-11-22 ENCOUNTER — Encounter: Payer: Self-pay | Admitting: Cardiology

## 2014-11-22 VITALS — BP 128/66 | HR 67 | Ht 60.0 in | Wt 124.4 lb

## 2014-11-22 DIAGNOSIS — I119 Hypertensive heart disease without heart failure: Secondary | ICD-10-CM | POA: Diagnosis not present

## 2014-11-22 DIAGNOSIS — I482 Chronic atrial fibrillation, unspecified: Secondary | ICD-10-CM

## 2014-11-22 DIAGNOSIS — Z954 Presence of other heart-valve replacement: Secondary | ICD-10-CM

## 2014-11-22 NOTE — Telephone Encounter (Signed)
Okay to refill? 

## 2014-11-22 NOTE — Telephone Encounter (Signed)
Ok to refill 

## 2014-11-22 NOTE — Progress Notes (Signed)
Cardiology Office Note   Date:  11/22/2014   ID:  Deborah [name], DOB Jan 09, 1927, MRN [id number]  PCP:  Warren Danes, MD  Cardiologist: Darlin Coco MD  No chief complaint on file.     History of Present Illness: Deborah [name] is a 81 y.o. female who presents for 3 month follow-up office visit. Deborah [name] is a 81 y.o. female who presents for follow-up for her high blood pressure   She has a history of previous mechanical aortic valve replacement in 1996 and she has a history of chronic atrial fibrillation and is on daily Lovenox injection in place of warfarin.  When she was on warfarin she was having problems with recurrent GI bleeding requiring transfusions.  Once switching to daily Lovenox injection her GI bleeding has resolved and her hemoglobin has been stable.  She has a Port-A-Cath in place in her left upper chest because of poor venous access. She does not have any ischemic heart disease and she had a normal nuclear stress test in March 2012. She has not been experiencing any new cardiac symptoms. She does have a past history of high blood pressure. Her blood pressure previously tended to go quite high during the night and she would have headaches.  Now that she is on all 4 of her blood pressure medicines twice a day, her blood pressure has been more stable and the headaches are very infrequent. [place] the [place].  She was trying to keep her daughters new prep E from getting to the patient's pills which were on the bedside table.  She lost her balance as she turned and she fell landing on her back and her hip.  She did not break anything.  She subsequently saw Dr. Rodell Perna who did x-rays and noted a CT.  Does have a lot of osteoarthritis.     Past Medical History  Diagnosis Date  . Hypothyroidism   . Stomach problems     seeing Dr Watt Climes  . Hypertension   . GERD (gastroesophageal reflux disease)   . Anemia   . Arrhythmia     Atrial Fibrillation   . Breast cancer     "right; S/P lumpectomy, chemo, XRT"   . Complication of anesthesia     "once I'm awakened, I don't sleep til the following night"  . High cholesterol   . Heart murmur   . History of blood transfusion 1996; ~ 2013    "related to valve replacement; GI bleeding"   . Chronic lower GI bleeding   . Arthritis     "fingers, toes, hips; qwhere" (05/19/2013)  . Gout   . Pneumonia 07/2011    Past Surgical History  Procedure Laterality Date  . Arteriogram  01/12    NO RAS   . Cardioversion      X 2  . Cardiac catheterization    . Cataract extraction w/ intraocular lens  implant, bilateral Bilateral     bilateral cataract with lens implants  . Cholecystectomy    . Abdominal hysterectomy    . Appendectomy    . Colonoscopy with propofol N/A 11/24/2012    Procedure: COLONOSCOPY WITH PROPOFOL;  Surgeon: Jeryl Columbia, MD;  Location: [facility];  Service: Endoscopy;  Laterality: N/A;  . Breast biopsy Right   . Breast lumpectomy Right   . Aortic valve replacement  1996    [employer]  . Cardiac valve replacement  1996     Current Outpatient  Prescriptions  Medication Sig Dispense Refill  . acetaminophen (TYLENOL) 500 MG tablet Take 1,000 mg by mouth every 6 (six) hours as needed for headache.    . albuterol (PROVENTIL HFA;VENTOLIN HFA) 108 (90 BASE) MCG/ACT inhaler Inhale 1-2 puffs into the lungs every 6 (six) hours as needed for wheezing or shortness of breath. 1 Inhaler 2  . allopurinol (ZYLOPRIM) 300 MG tablet Take 300 mg by mouth daily.    Marland Kitchen amLODipine (NORVASC) 5 MG tablet Take 1 tablet (5 mg total) by mouth 2 (two) times daily. 180 tablet 3  . calcium-vitamin D 500 MG tablet Take 1 tablet by mouth 2 (two) times daily.      . celecoxib (CELEBREX) 200 MG capsule Take 1 capsule (200 mg total) by mouth daily. 30 capsule 5  . dicyclomine (BENTYL) 10 MG capsule Take 10 mg by mouth daily as needed for spasms.     Marland Kitchen DIGOX 125 MCG tablet TAKE ONE TABLET EACH DAY (Patient  taking differently: TAKE ONE TABLET EACH DAY BY MOUTH) 30 tablet 3  . doxycycline (VIBRAMYCIN) 100 MG capsule Take 1 capsule (100 mg total) by mouth 2 (two) times daily. 14 capsule 0  . enoxaparin (LOVENOX) 100 MG/ML injection INJECT 0.8m ('85mg'$  TOTAL) INTO SKIN DAILY 30 mL 3  . fexofenadine (ALLEGRA ALLERGY) 180 MG tablet Take 180 mg by mouth daily as needed for allergies.     . furosemide (LASIX) 40 MG tablet Take 40 mg by mouth daily as needed (for swelling).    . hydrochlorothiazide (MICROZIDE) 12.5 MG capsule Take 1 capsule (12.5 mg total) by mouth 2 (two) times daily. 180 capsule 3  . levothyroxine (SYNTHROID, LEVOTHROID) 88 MCG tablet Take 88 mcg by mouth daily before breakfast.    . lidocaine-prilocaine (EMLA) cream Apply 1 application topically as needed (prior to infusion at porta cath site). 30 g 1  . losartan (COZAAR) 50 MG tablet Take 1 tablet (50 mg total) by mouth 2 (two) times daily. 60 tablet 5  . metoprolol tartrate (LOPRESSOR) 25 MG tablet Take 25 mg by mouth 2 (two) times daily.     . multivitamin (THERAGRAN) per tablet Take 1 tablet by mouth daily.     .Marland KitchenNITROSTAT 0.4 MG SL tablet    1  . Omega-3 Fatty Acids (FISH OIL) 1000 MG CAPS Take 1,000 mg by mouth 2 (two) times daily.     . potassium chloride (KLOR-CON) 20 MEQ packet Take 20 mEq by mouth daily.    . simvastatin (ZOCOR) 20 MG tablet Take 10 mg by mouth daily.    .Marland Kitchenzolpidem (AMBIEN CR) 12.5 MG CR tablet TAKE ONE TABLET AT BEDTIME AS NEEDED (Patient taking differently: TAKE ONE TABLET AT BEDTIME AS NEEDED FOR SLEEP) 30 tablet 5   No current facility-administered medications for this visit.    Allergies:   Amiodarone; Alendronate sodium; Macrodantin; Meclizine; Norvasc; and Tussionex pennkinetic er    Social History:  The patient  reports that she quit smoking about 26 years ago. Her smoking use included Cigarettes. She has a 20 pack-year smoking history. She has never used smokeless tobacco. She reports that she  drinks about 4.8 oz of alcohol per week. She reports that she does not use illicit drugs.   Family History:  The patient's family history includes Coronary artery disease in her father; Dementia in her mother.    ROS:  Please see the history of present illness.   Otherwise, review of systems are positive for none.  All other systems are reviewed and negative.    PHYSICAL EXAM: VS:  BP 128/66 mmHg  Pulse 67  Ht 5' (1.524 m)  Wt 124 lb 6.4 oz (56.427 kg)  BMI 24.30 kg/m2 , BMI Body mass index is 24.3 kg/(m^2). GEN: Well nourished, well developed, in no acute distress HEENT: normal Neck: no JVD, carotid bruits, or masses Cardiac: Grade 3/6 systolic ejection murmur across her prosthetic mechanical aortic valve.  No aortic insufficiency.  The pulse is irregularly irregular.  Atrial fibrillation with a controlled ventricular response.  No peripheral edema. Respiratory:  clear to auscultation bilaterally, normal work of breathing GI: soft, nontender, nondistended, + BS MS: no deformity or atrophy Skin: warm and dry, no rash Neuro:  Strength and sensation are intact Psych: euthymic mood, full affect      Recent Labs: 04/19/2014: TSH 1.915 05/13/2014: ALT 32 05/24/2014: BUN 36*; Creatinine, Ser 0.74; Potassium 4.4; Sodium 134* 11/17/2014: HGB 11.9; Platelets 162    Lipid Panel    Component Value Date/Time   CHOL 143 04/19/2014 1025   TRIG 108 04/19/2014 1025   HDL 63 04/19/2014 1025   CHOLHDL 2.3 04/19/2014 1025   VLDL 22 04/19/2014 1025   LDLCALC 58 04/19/2014 1025   LDLDIRECT 93.7 04/10/2011 1000      Wt Readings from Last 3 Encounters:  11/22/14 124 lb 6.4 oz (56.427 kg)  09/15/14 124 lb 8 oz (56.473 kg)  08/02/14 121 lb 12.8 oz (55.248 kg)        ASSESSMENT AND PLAN:  1. Mechanical aortic valve replacement in 1996. 2. essential hypertension 3. chronic atrial fibrillation. She has done better since switching from warfarin to Lovenox injections 4. History of  occult GI blood loss with iron deficiency anemia, followed by Dr. Watt Climes and Dr. Jana Hakim. 5. Hypothyroidism 6. Hypercholesterolemia   Current medicines are reviewed at length with the patient today.  The patient does not have concerns regarding medicines.  The following changes have been made:  no change  Labs/ tests ordered today include: She gets her blood work at the oncology [facility] . No orders of the defined types were placed in this encounter.     Disposition: Continue current medication.  Recheck in 3 months for office visit.  Berna Spare MD 11/22/2014 12:14 PM    [facility] [address], [place], [place]  [address] Phone: [phone]; Fax: [phone]

## 2014-11-22 NOTE — Patient Instructions (Signed)
Medication Instructions:  Your physician recommends that you continue on your current medications as directed. Please refer to the Current Medication list given to you today.  Labwork: None  Testing/Procedures: none  Follow-Up: Your physician recommends that you schedule a follow-up appointment in: 3 month ov

## 2014-11-24 ENCOUNTER — Telehealth: Payer: Self-pay | Admitting: *Deleted

## 2014-11-24 ENCOUNTER — Other Ambulatory Visit: Payer: Medicare Other

## 2014-11-24 NOTE — Telephone Encounter (Signed)
This RN returned VM from pt asking about results per draw 8/18.  Noted heme of 11.9 and ferritin of 78.  Per MD recommendation - iron infusion can be given to maintain level at 100 or greater.  Returned call to pt and obtained identified VM- message left per above with request for a return call to discuss appropriate scheduling.

## 2014-11-29 ENCOUNTER — Telehealth: Payer: Self-pay | Admitting: Oncology

## 2014-11-29 ENCOUNTER — Other Ambulatory Visit: Payer: Self-pay | Admitting: *Deleted

## 2014-11-29 NOTE — Telephone Encounter (Signed)
Confirmed appointment for 09/09

## 2014-12-08 ENCOUNTER — Other Ambulatory Visit: Payer: Medicare Other

## 2014-12-09 ENCOUNTER — Ambulatory Visit: Payer: Medicare Other

## 2014-12-09 ENCOUNTER — Ambulatory Visit (HOSPITAL_BASED_OUTPATIENT_CLINIC_OR_DEPARTMENT_OTHER): Payer: Medicare Other

## 2014-12-09 ENCOUNTER — Other Ambulatory Visit (HOSPITAL_BASED_OUTPATIENT_CLINIC_OR_DEPARTMENT_OTHER): Payer: Medicare Other

## 2014-12-09 ENCOUNTER — Other Ambulatory Visit: Payer: Self-pay | Admitting: Oncology

## 2014-12-09 VITALS — BP 152/63 | HR 65 | Temp 98.0°F | Resp 18

## 2014-12-09 DIAGNOSIS — D500 Iron deficiency anemia secondary to blood loss (chronic): Secondary | ICD-10-CM

## 2014-12-09 DIAGNOSIS — R5383 Other fatigue: Principal | ICD-10-CM

## 2014-12-09 DIAGNOSIS — D689 Coagulation defect, unspecified: Secondary | ICD-10-CM

## 2014-12-09 DIAGNOSIS — Z954 Presence of other heart-valve replacement: Secondary | ICD-10-CM

## 2014-12-09 DIAGNOSIS — K2961 Other gastritis with bleeding: Secondary | ICD-10-CM

## 2014-12-09 DIAGNOSIS — R5381 Other malaise: Secondary | ICD-10-CM

## 2014-12-09 DIAGNOSIS — Z7901 Long term (current) use of anticoagulants: Secondary | ICD-10-CM

## 2014-12-09 DIAGNOSIS — Z95828 Presence of other vascular implants and grafts: Secondary | ICD-10-CM

## 2014-12-09 DIAGNOSIS — I4891 Unspecified atrial fibrillation: Secondary | ICD-10-CM

## 2014-12-09 LAB — CBC WITH DIFFERENTIAL/PLATELET
BASO%: 0.7 % (ref 0.0–2.0)
Basophils Absolute: 0 10*3/uL (ref 0.0–0.1)
EOS%: 1 % (ref 0.0–7.0)
Eosinophils Absolute: 0.1 10*3/uL (ref 0.0–0.5)
HEMATOCRIT: 39 % (ref 34.8–46.6)
HEMOGLOBIN: 13 g/dL (ref 11.6–15.9)
LYMPH#: 0.5 10*3/uL — AB (ref 0.9–3.3)
LYMPH%: 9.8 % — ABNORMAL LOW (ref 14.0–49.7)
MCH: 34 pg (ref 25.1–34.0)
MCHC: 33.4 g/dL (ref 31.5–36.0)
MCV: 101.8 fL — ABNORMAL HIGH (ref 79.5–101.0)
MONO#: 0.4 10*3/uL (ref 0.1–0.9)
MONO%: 6.8 % (ref 0.0–14.0)
NEUT%: 81.7 % — ABNORMAL HIGH (ref 38.4–76.8)
NEUTROS ABS: 4.3 10*3/uL (ref 1.5–6.5)
Platelets: 171 10*3/uL (ref 145–400)
RBC: 3.83 10*6/uL (ref 3.70–5.45)
RDW: 14.1 % (ref 11.2–14.5)
WBC: 5.3 10*3/uL (ref 3.9–10.3)

## 2014-12-09 LAB — FERRITIN CHCC: FERRITIN: 64 ng/mL (ref 9–269)

## 2014-12-09 MED ORDER — SODIUM CHLORIDE 0.9 % IV SOLN
Freq: Once | INTRAVENOUS | Status: AC
  Administered 2014-12-09: 11:00:00 via INTRAVENOUS

## 2014-12-09 MED ORDER — HEPARIN SOD (PORK) LOCK FLUSH 100 UNIT/ML IV SOLN
500.0000 [IU] | Freq: Once | INTRAVENOUS | Status: AC
  Administered 2014-12-09: 500 [IU] via INTRAVENOUS
  Filled 2014-12-09: qty 5

## 2014-12-09 MED ORDER — SODIUM CHLORIDE 0.9 % IJ SOLN
10.0000 mL | INTRAMUSCULAR | Status: DC | PRN
  Administered 2014-12-09: 10 mL via INTRAVENOUS
  Filled 2014-12-09: qty 10

## 2014-12-09 MED ORDER — HEPARIN SOD (PORK) LOCK FLUSH 100 UNIT/ML IV SOLN
500.0000 [IU] | Freq: Once | INTRAVENOUS | Status: DC
  Filled 2014-12-09: qty 5

## 2014-12-09 MED ORDER — SODIUM CHLORIDE 0.9 % IV SOLN
510.0000 mg | Freq: Once | INTRAVENOUS | Status: AC
  Administered 2014-12-09: 510 mg via INTRAVENOUS
  Filled 2014-12-09: qty 17

## 2014-12-09 NOTE — Patient Instructions (Signed)
Ferumoxytol injection What is this medicine? FERUMOXYTOL is an iron complex. Iron is used to make healthy red blood cells, which carry oxygen and nutrients throughout the body. This medicine is used to treat iron deficiency anemia in people with chronic kidney disease. This medicine may be used for other purposes; ask your health care Berea Majkowski or pharmacist if you have questions. COMMON BRAND NAME(S): Feraheme What should I tell my health care Stavroula Rohde before I take this medicine? They need to know if you have any of these conditions: -anemia not caused by low iron levels -high levels of iron in the blood -magnetic resonance imaging (MRI) test scheduled -an unusual or allergic reaction to iron, other medicines, foods, dyes, or preservatives -pregnant or trying to get pregnant -breast-feeding How should I use this medicine? This medicine is for injection into a vein. It is given by a health care professional in a hospital or clinic setting. Talk to your pediatrician regarding the use of this medicine in children. Special care may be needed. Overdosage: If you think you've taken too much of this medicine contact a poison control center or emergency room at once. Overdosage: If you think you have taken too much of this medicine contact a poison control center or emergency room at once. NOTE: This medicine is only for you. Do not share this medicine with others. What if I miss a dose? It is important not to miss your dose. Call your doctor or health care professional if you are unable to keep an appointment. What may interact with this medicine? This medicine may interact with the following medications: -other iron products This list may not describe all possible interactions. Give your health care Caryssa Elzey a list of all the medicines, herbs, non-prescription drugs, or dietary supplements you use. Also tell them if you smoke, drink alcohol, or use illegal drugs. Some items may interact with your  medicine. What should I watch for while using this medicine? Visit your doctor or healthcare professional regularly. Tell your doctor or healthcare professional if your symptoms do not start to get better or if they get worse. You may need blood work done while you are taking this medicine. You may need to follow a special diet. Talk to your doctor. Foods that contain iron include: whole grains/cereals, dried fruits, beans, or peas, leafy green vegetables, and organ meats (liver, kidney). What side effects may I notice from receiving this medicine? Side effects that you should report to your doctor or health care professional as soon as possible: -allergic reactions like skin rash, itching or hives, swelling of the face, lips, or tongue -breathing problems -changes in blood pressure -feeling faint or lightheaded, falls -fever or chills -flushing, sweating, or hot feelings -swelling of the ankles or feet Side effects that usually do not require medical attention (Report these to your doctor or health care professional if they continue or are bothersome.): -diarrhea -headache -nausea, vomiting -stomach pain This list may not describe all possible side effects. Call your doctor for medical advice about side effects. You may report side effects to FDA at 1-800-FDA-1088. Where should I keep my medicine? This drug is given in a hospital or clinic and will not be stored at home. NOTE: This sheet is a summary. It may not cover all possible information. If you have questions about this medicine, talk to your doctor, pharmacist, or health care Angeli Demilio.  2015, Elsevier/Gold Standard. (2011-11-01 15:23:36)  

## 2014-12-09 NOTE — Patient Instructions (Signed)

## 2014-12-14 ENCOUNTER — Telehealth: Payer: Self-pay | Admitting: Cardiology

## 2014-12-14 NOTE — Telephone Encounter (Signed)
Spoke with patient and her blood pressure has been waking her up the last 3 mornings, running 180's/90's Patient did take a Lasix around 3 this am and it brought her blood pressure down The elevated blood pressures have been going on off and on for several months Did discuss the Celebrex and she changed to generic Celebrex several months ago. She is taking daily Called the pharmacy and she changed to generic Celebrex in April and that is when she started having trouble with the blood pressure  Tried to call back and discuss further, had to leave her a message to call back

## 2014-12-14 NOTE — Telephone Encounter (Signed)
New message   Pt c/o BP issue: STAT if pt c/o blurred vision, one-sided weakness or slurred speech  1. What are your last 5 BP readings? 146/69 9 am today; 3 am today 168/86; Sunday 4am 170/91; Sunday a week ago 187/98  2. Are you having any other symptoms (ex. Dizziness, headache, blurred vision, passed out)? Headache  3. What is your BP issue? B/p spikes during the night causing headache  Pt would like you to call her cell # if you do not reach her at home

## 2014-12-16 ENCOUNTER — Telehealth: Payer: Self-pay | Admitting: *Deleted

## 2014-12-16 ENCOUNTER — Ambulatory Visit (HOSPITAL_BASED_OUTPATIENT_CLINIC_OR_DEPARTMENT_OTHER): Payer: Medicare Other

## 2014-12-16 ENCOUNTER — Other Ambulatory Visit: Payer: Self-pay | Admitting: Nurse Practitioner

## 2014-12-16 VITALS — BP 151/56 | HR 46 | Temp 97.1°F | Resp 18

## 2014-12-16 DIAGNOSIS — D500 Iron deficiency anemia secondary to blood loss (chronic): Secondary | ICD-10-CM | POA: Diagnosis present

## 2014-12-16 MED ORDER — SODIUM CHLORIDE 0.9 % IV SOLN
Freq: Once | INTRAVENOUS | Status: AC
  Administered 2014-12-16: 11:00:00 via INTRAVENOUS

## 2014-12-16 MED ORDER — SODIUM CHLORIDE 0.9 % IJ SOLN
10.0000 mL | INTRAMUSCULAR | Status: DC | PRN
  Administered 2014-12-16: 10 mL
  Filled 2014-12-16: qty 10

## 2014-12-16 MED ORDER — SODIUM CHLORIDE 0.9 % IV SOLN
510.0000 mg | Freq: Once | INTRAVENOUS | Status: AC
  Administered 2014-12-16: 510 mg via INTRAVENOUS
  Filled 2014-12-16: qty 17

## 2014-12-16 MED ORDER — HEPARIN SOD (PORK) LOCK FLUSH 100 UNIT/ML IV SOLN
500.0000 [IU] | Freq: Once | INTRAVENOUS | Status: AC | PRN
  Administered 2014-12-16: 500 [IU]
  Filled 2014-12-16: qty 5

## 2014-12-16 NOTE — Telephone Encounter (Signed)
Follow up     Pt pulse is 46 at this time

## 2014-12-16 NOTE — Telephone Encounter (Signed)
Spoke with patient earlier and heart rate 57 Called to check on her and now her heart rate is 45-47 Advised patient to hold her evening dose of Metoprolol  If heart rate in the morning 60 or greater ok to take her Metoprolol 25 mg at 1/2 tablet instead of 25 mg  Advised patient to start checking her heart rate before each dose and ok to take Metoprolol 25 mg at 1/2 dose only if HR 60 or above  If heart rate feels like it racing ok to take another 1/2 tablet  If heart rate remains in the 40's without Metoprolol call on call MD Call back Monday with update

## 2014-12-16 NOTE — Telephone Encounter (Signed)
Received a call from patient stating she is at the cancer center getting an infusion and heart rate was 47 and 52 Patient will have heart rate checked again after infusion and call with update

## 2014-12-16 NOTE — Telephone Encounter (Signed)
Did advise patient if she feels dizzy or like she is going to pass out to call 911

## 2014-12-16 NOTE — Patient Instructions (Signed)

## 2014-12-16 NOTE — Telephone Encounter (Signed)
Did speak with Gay Filler in Pharm D regarding blood pressure readings and elevated blood pressure's being related to change in Celebrex to generic Elita Boone D recommended changing Amlodipine from BID to 10 mg in the evening  Had left patient message yesterday to call back Call returned this am and recommendations given Patient will start to monitor blood pressure daily and call back with readings

## 2014-12-16 NOTE — Progress Notes (Signed)
Patient's pulse was 46 on arrival to unit. Manually it was 79. Dr. Virgie Dad nurse called. Instructions for patient to call Dr. Sherryl Barters office which patient did while in infusion room.

## 2014-12-17 ENCOUNTER — Emergency Department (HOSPITAL_COMMUNITY)
Admission: EM | Admit: 2014-12-17 | Discharge: 2014-12-17 | Disposition: A | Discharge status: Home / Self Care | Payer: Medicare Other | Source: Home / Self Care | Attending: Emergency Medicine | Admitting: Emergency Medicine

## 2014-12-17 ENCOUNTER — Encounter (HOSPITAL_COMMUNITY): Payer: Self-pay | Admitting: Emergency Medicine

## 2014-12-17 DIAGNOSIS — Z8679 Personal history of other diseases of the circulatory system: Secondary | ICD-10-CM

## 2014-12-17 DIAGNOSIS — T148XXA Other injury of unspecified body region, initial encounter: Secondary | ICD-10-CM

## 2014-12-17 DIAGNOSIS — S301XXA Contusion of abdominal wall, initial encounter: Secondary | ICD-10-CM | POA: Diagnosis not present

## 2014-12-17 LAB — URINALYSIS, ROUTINE W REFLEX MICROSCOPIC
Bilirubin Urine: NEGATIVE
GLUCOSE, UA: NEGATIVE mg/dL
KETONES UR: 15 mg/dL — AB
Nitrite: NEGATIVE
PROTEIN: 100 mg/dL — AB
Specific Gravity, Urine: 1.01 (ref 1.005–1.030)
UROBILINOGEN UA: 0.2 mg/dL (ref 0.0–1.0)
pH: 6 (ref 5.0–8.0)

## 2014-12-17 LAB — URINE MICROSCOPIC-ADD ON

## 2014-12-17 NOTE — ED Provider Notes (Signed)
CSN: [id number]     Arrival date & time 12/17/14  1808 History   First MD Initiated Contact with Patient 12/17/14 1814     Chief Complaint  Patient presents with  . Abdominal Pain     (Consider location/radiation/quality/duration/timing/severity/associated sxs/prior Treatment) HPI Patient reports that she has pain in her left lower abdomen. It's worse with movements. She denies any associated vomiting or diarrhea. She has been doing Lovenox injections in her abdomen for chronic atrial fibrillation and a cardiac valve. She has not had fevers, chills or urinary symptoms. The patient has prior history of GI bleed when she was on Coumadin, she however is not seeing any evidence of blood in her stool.  Patient also reports that her metoprolol dose was have by the nurse at [facility]. She states her heart rate had been in the 40s intermittently. She does not report asymptomatic with that. She has not had lightheadedness or near syncope or chest pain. She did not take her Lopressor last night and took a half dose this morning. She is wondering if she should take a full dose or half dose this evening.  Past Medical History  Diagnosis Date  . Hypothyroidism   . Stomach problems     seeing Dr Watt Climes  . Hypertension   . GERD (gastroesophageal reflux disease)   . Anemia   . Arrhythmia     Atrial Fibrillation  . Breast cancer     "right; S/P lumpectomy, chemo, XRT"   . Complication of anesthesia     "once I'm awakened, I don't sleep til the following night"  . High cholesterol   . Heart murmur   . History of blood transfusion 1996; ~ 2013    "related to valve replacement; GI bleeding"   . Chronic lower GI bleeding   . Arthritis     "fingers, toes, hips; qwhere" (05/19/2013)  . Gout   . Pneumonia 07/2011   Past Surgical History  Procedure Laterality Date  . Arteriogram  01/12    NO RAS   . Cardioversion      X 2  . Cardiac catheterization    . Cataract extraction w/ intraocular lens   implant, bilateral Bilateral     bilateral cataract with lens implants  . Cholecystectomy    . Abdominal hysterectomy    . Appendectomy    . Colonoscopy with propofol N/A 11/24/2012    Procedure: COLONOSCOPY WITH PROPOFOL;  Surgeon: Jeryl Columbia, MD;  Location: [facility];  Service: Endoscopy;  Laterality: N/A;  . Breast biopsy Right   . Breast lumpectomy Right   . Aortic valve replacement  1996    [employer]  . Cardiac valve replacement  1996   Family History  Problem Relation Age of Onset  . Dementia Mother   . Coronary artery disease Father    Social History  Substance Use Topics  . Smoking status: Former Smoker -- 1.00 packs/day for 20 years    Types: Cigarettes    Quit date: 04/01/1988  . Smokeless tobacco: Never Used  . Alcohol Use: 4.8 oz/week    4 Glasses of wine, 4 Shots of liquor per week     Comment: burbon or glass wine daily   OB History    No data available     Review of Systems  10 Systems reviewed and are negative for acute change except as noted in the HPI.   Allergies  Amiodarone; Alendronate sodium; Macrodantin; Meclizine; Norvasc; and Tussionex pennkinetic er  Home  Medications   Prior to Admission medications   Medication Sig Start Date End Date Taking? Authorizing Provider  acetaminophen (TYLENOL) 500 MG tablet Take 1,000 mg by mouth every 6 (six) hours as needed for headache.    Historical Provider, MD  albuterol (PROVENTIL HFA;VENTOLIN HFA) 108 (90 BASE) MCG/ACT inhaler Inhale 1-2 puffs into the lungs every 6 (six) hours as needed for wheezing or shortness of breath. 09/15/14   Susanne Borders, NP  allopurinol (ZYLOPRIM) 300 MG tablet Take 300 mg by mouth daily.    Historical Provider, MD  allopurinol (ZYLOPRIM) 300 MG tablet TAKE ONE TABLET EACH DAY 11/22/14   Darlin Coco, MD  amLODipine (NORVASC) 5 MG tablet Take 1 tablet (5 mg total) by mouth 2 (two) times daily. 08/02/14   Darlin Coco, MD  calcium-vitamin D 500 MG tablet Take 1 tablet  by mouth 2 (two) times daily.      Historical Provider, MD  celecoxib (CELEBREX) 200 MG capsule Take 1 capsule (200 mg total) by mouth daily. 10/13/14   Darlin Coco, MD  dicyclomine (BENTYL) 10 MG capsule Take 10 mg by mouth daily as needed for spasms.     Historical Provider, MD  [place] 125 MCG tablet TAKE ONE TABLET EACH DAY Patient taking differently: TAKE ONE TABLET EACH DAY BY MOUTH 10/31/14   Darlin Coco, MD  doxycycline (VIBRAMYCIN) 100 MG capsule Take 1 capsule (100 mg total) by mouth 2 (two) times daily. 09/21/14   Darlin Coco, MD  enoxaparin (LOVENOX) 100 MG/ML injection INJECT 0.94m ('85mg'$  TOTAL) INTO SKIN DAILY 09/26/14   GChauncey Cruel MD  fexofenadine ([facility]) 180 MG tablet Take 180 mg by mouth daily as needed for allergies.     Historical Provider, MD  furosemide (LASIX) 40 MG tablet Take 40 mg by mouth daily as needed (for swelling).    Historical Provider, MD  hydrochlorothiazide (MICROZIDE) 12.5 MG capsule Take 1 capsule (12.5 mg total) by mouth 2 (two) times daily. 09/21/14   TDarlin Coco MD  levothyroxine (SYNTHROID, LEVOTHROID) 88 MCG tablet Take 88 mcg by mouth daily before breakfast.    Historical Provider, MD  lidocaine-prilocaine (EMLA) cream Apply 1 application topically as needed (prior to infusion at porta cath site). 09/01/14   GChauncey Cruel MD  losartan (COZAAR) 50 MG tablet Take 1 tablet (50 mg total) by mouth 2 (two) times daily. 07/29/14   TDarlin Coco MD  metoprolol tartrate (LOPRESSOR) 25 MG tablet Take 25 mg by mouth 2 (two) times daily.     Historical Provider, MD  multivitamin ([facility] per tablet Take 1 tablet by mouth daily.     Historical Provider, MD  NITROSTAT 0.4 MG SL tablet   09/12/14   Historical Provider, MD  Omega-3 Fatty Acids (FISH OIL) 1000 MG CAPS Take 1,000 mg by mouth 2 (two) times daily.     Historical Provider, MD  potassium chloride (KLOR-CON) 20 MEQ packet Take 20 mEq by mouth daily.    Historical Provider,  MD  simvastatin (ZOCOR) 20 MG tablet Take 10 mg by mouth daily.    Historical Provider, MD  zolpidem (AMBIEN CR) 12.5 MG CR tablet TAKE ONE TABLET AT BEDTIME AS NEEDED Patient taking differently: TAKE ONE TABLET AT BEDTIME AS NEEDED FOR SLEEP 08/07/14   TDarlin Coco MD   BP 182/63 mmHg  Pulse 64  Temp(Src) 98.2 F (36.8 C) (Oral)  Resp 18  SpO2 95% Physical Exam  Constitutional: She is oriented to person, place, and time. She appears  well-developed and well-nourished.  HENT:  Head: Normocephalic and atraumatic.  Eyes: EOM are normal.  Cardiovascular: Normal rate and intact distal pulses.   Patient has irregularly irregular rhythm. 2/6 systolic ejection murmur. Heart rate ranges from 50s to 90s atrial fibrillation.  Pulmonary/Chest: Effort normal and breath sounds normal. No respiratory distress. She has no wheezes.  Abdominal: Soft. Bowel sounds are normal. She exhibits mass. She exhibits no distension. There is tenderness.  The patient has areas of ecchymosis on her lower abdomen from Lovenox injections. In the left lower quadrant she has an approximately 10 x 5 cm firm and tender mass consistent with hematoma. I'm able to palpate the remainder the abdomen without any pain. I'm able to perform deep palpation and send a fluid wave through the abdomen without creating any pain in the left lower quadrant.  Musculoskeletal: Normal range of motion. She exhibits no edema.  Neurological: She is alert and oriented to person, place, and time. She has normal strength. Coordination normal. GCS eye subscore is 4. GCS verbal subscore is 5. GCS motor subscore is 6.  Skin: Skin is warm, dry and intact.  Psychiatric: She has a normal mood and affect.    ED Course  Procedures (including critical care time) Labs Review Labs Reviewed  URINALYSIS, ROUTINE W REFLEX MICROSCOPIC (NOT AT [facility]) - Abnormal; Notable for the following:    APPearance CLOUDY (*)    Hgb urine dipstick TRACE (*)    Ketones, ur  15 (*)    Protein, ur 100 (*)    Leukocytes, UA TRACE (*)    All other components within normal limits  URINE MICROSCOPIC-ADD ON    Imaging Review No results found. I have personally reviewed and evaluated these images and lab results as part of my medical decision-making.   EKG Interpretation None      MDM   Final diagnoses:  Hematoma  History of chronic atrial fibrillation   Patient's chief complaint of abdominal pain is consistent with a abdominal wall hematoma to the Lovenox. There is no evidence of intra-abdominal etiology. The patient is otherwise well. She also advises she had recently been counseled to change her metoprolol dose due to bradycardia. By description the patient is asymptomatic. She did not have her evening dose of metoprolol and took a half dose this morning. The patient's heart rate is ranging from 50s to 90s and she is due to take her next metoprolol upon returning home. At this time I have counseled her to again take a half a dose this evening and recheck her heart rates tomorrow. She is to follow-up with her cardiologist next week and return to the emergency department should she have any concerns or symptoms.    Charlesetta Shanks, MD 12/17/14 [phone]

## 2014-12-17 NOTE — ED Notes (Signed)
Per EMS:  Pt here from [facility].  Pt has a hx of colon cancer and diverticulitis.  Pt has been taking new Lovenox shots (taken off of Coumadin) and is receiving Iron shots.  Pt woke up today with pain in her left belly.  Denies any n/v/d.  Pain increases with palpation.  Pt alert and oriented and able to ambulate from stretcher with EMS.

## 2014-12-17 NOTE — Discharge Instructions (Signed)
Hematoma A hematoma is a collection of blood under the skin, in an organ, in a body space, in a joint space, or in other tissue. The blood can clot to form a lump that you can see and feel. The lump is often firm and may sometimes become sore and tender. Most hematomas get better in a few days to weeks. However, some hematomas may be serious and require medical care. Hematomas can range in size from very small to very large. CAUSES  A hematoma can be caused by a blunt or penetrating injury. It can also be caused by spontaneous leakage from a blood vessel under the skin. Spontaneous leakage from a blood vessel is more likely to occur in older people, especially those taking blood thinners. Sometimes, a hematoma can develop after certain medical procedures. SIGNS AND SYMPTOMS   A firm lump on the body.  Possible pain and tenderness in the area.  Bruising.Blue, dark blue, purple-red, or yellowish skin may appear at the site of the hematoma if the hematoma is close to the surface of the skin. For hematomas in deeper tissues or body spaces, the signs and symptoms may be subtle. For example, an intra-abdominal hematoma may cause abdominal pain, weakness, fainting, and shortness of breath. An intracranial hematoma may cause a headache or symptoms such as weakness, trouble speaking, or a change in consciousness. DIAGNOSIS  A hematoma can usually be diagnosed based on your medical history and a physical exam. Imaging tests may be needed if your health care provider suspects a hematoma in deeper tissues or body spaces, such as the abdomen, head, or chest. These tests may include ultrasonography or a CT scan.  TREATMENT  Hematomas usually go away on their own over time. Rarely does the blood need to be drained out of the body. Large hematomas or those that may affect vital organs will sometimes need surgical drainage or monitoring. HOME CARE INSTRUCTIONS   Apply ice to the injured area:   Put ice in a  plastic bag.   Place a towel between your skin and the bag.   Leave the ice on for 20 minutes, 2-3 times a day for the first 1 to 2 days.   After the first 2 days, switch to using warm compresses on the hematoma.   Elevate the injured area to help decrease pain and swelling. Wrapping the area with an elastic bandage may also be helpful. Compression helps to reduce swelling and promotes shrinking of the hematoma. Make sure the bandage is not wrapped too tight.   If your hematoma is on a lower extremity and is painful, crutches may be helpful for a couple days.   Only take over-the-counter or prescription medicines as directed by your health care provider. SEEK IMMEDIATE MEDICAL CARE IF:   You have increasing pain, or your pain is not controlled with medicine.   You have a fever.   You have worsening swelling or discoloration.   Your skin over the hematoma breaks or starts bleeding.   Your hematoma is in your chest or abdomen and you have weakness, shortness of breath, or a change in consciousness.  Your hematoma is on your scalp (caused by a fall or injury) and you have a worsening headache or a change in alertness or consciousness. MAKE SURE YOU:   Understand these instructions.  Will watch your condition.  Will get help right away if you are not doing well or get worse. Document Released: 10/31/2003 Document Revised: 11/18/2012 Document Reviewed: 08/26/2012  [employer] Patient Information 2015 [place]. This information is not intended to replace advice given to you by your health care provider. Make sure you discuss any questions you have with your health care provider. Atrial Fibrillation Atrial fibrillation is a type of irregular heart rhythm (arrhythmia). During atrial fibrillation, the upper chambers of the heart (atria) quiver continuously in a chaotic pattern. This causes an irregular and often rapid heart rate.  Atrial fibrillation is the result of the  heart becoming overloaded with disorganized signals that tell it to beat. These signals are normally released one at a time by a part of the right atrium called the sinoatrial node. They then travel from the atria to the lower chambers of the heart (ventricles), causing the atria and ventricles to contract and pump blood as they pass. In atrial fibrillation, parts of the atria outside of the sinoatrial node also release these signals. This results in two problems. First, the atria receive so many signals that they do not have time to fully contract. Second, the ventricles, which can only receive one signal at a time, beat irregularly and out of rhythm with the atria.  There are three types of atrial fibrillation:   Paroxysmal. Paroxysmal atrial fibrillation starts suddenly and stops on its own within a week.  Persistent. Persistent atrial fibrillation lasts for more than a week. It may stop on its own or with treatment.  Permanent. Permanent atrial fibrillation does not go away. Episodes of atrial fibrillation may lead to permanent atrial fibrillation. Atrial fibrillation can prevent your heart from pumping blood normally. It increases your risk of stroke and can lead to heart failure.  CAUSES   Heart conditions, including a heart attack, heart failure, coronary artery disease, and heart valve conditions.   Inflammation of the sac that surrounds the heart (pericarditis).  Blockage of an artery in the lungs (pulmonary embolism).  Pneumonia or other infections.  Chronic lung disease.  Thyroid problems, especially if the thyroid is overactive (hyperthyroidism).  Caffeine, excessive alcohol use, and use of some illegal drugs.   Use of some medicines, including certain decongestants and diet pills.  Heart surgery.   Birth defects.  Sometimes, no cause can be found. When this happens, the atrial fibrillation is called lone atrial fibrillation. The risk of complications from atrial  fibrillation increases if you have lone atrial fibrillation and you are age 58 years or older. RISK FACTORS  Heart failure.  Coronary artery disease.  Diabetes mellitus.   High blood pressure (hypertension).   Obesity.   Other arrhythmias.   Increased age. SIGNS AND SYMPTOMS   A feeling that your heart is beating rapidly or irregularly.   A feeling of discomfort or pain in your chest.   Shortness of breath.   Sudden light-headedness or weakness.   Getting tired easily when exercising.   Urinating more often than normal (mainly when atrial fibrillation first begins).  In paroxysmal atrial fibrillation, symptoms may start and suddenly stop. DIAGNOSIS  Your health care provider may be able to detect atrial fibrillation when taking your pulse. Your health care provider may have you take a test called an ambulatory electrocardiogram (ECG). An ECG records your heartbeat patterns over a 24-hour period. You may also have other tests, such as:  Transthoracic echocardiogram (TTE). During echocardiography, sound waves are used to evaluate how blood flows through your heart.  Transesophageal echocardiogram (TEE).  Stress test. There is more than one type of stress test. If a stress test is needed, ask  your health care provider about which type is best for you.  Chest X-ray exam.  Blood tests.  Computed tomography (CT). TREATMENT  Treatment may include:  Treating any underlying conditions. For example, if you have an overactive thyroid, treating the condition may correct atrial fibrillation.  Taking medicine. Medicines may be given to control a rapid heart rate or to prevent blood clots, heart failure, or a stroke.  Having a procedure to correct the rhythm of the heart:  Electrical cardioversion. During electrical cardioversion, a controlled, low-energy shock is delivered to the heart through your skin. If you have chest pain, very low blood pressure, or sudden heart  failure, this procedure may need to be done as an emergency.  Catheter ablation. During this procedure, heart tissues that send the signals that cause atrial fibrillation are destroyed.  Surgical ablation. During this surgery, thin lines of heart tissue that carry the abnormal signals are destroyed. This procedure can either be an open-heart surgery or a minimally invasive surgery. With the minimally invasive surgery, small cuts are made to access the heart instead of a large opening.  Pulmonary venous isolation. During this surgery, tissue around the veins that carry blood from the lungs (pulmonary veins) is destroyed. This tissue is thought to carry the abnormal signals. HOME CARE INSTRUCTIONS   Take medicines only as directed by your health care provider. Some medicines can make atrial fibrillation worse or recur.  If blood thinners were prescribed by your health care provider, take them exactly as directed. Too much blood-thinning medicine can cause bleeding. If you take too little, you will not have the needed protection against stroke and other problems.  Perform blood tests at home if directed by your health care provider. Perform blood tests exactly as directed.  Quit smoking if you smoke.  Do not drink alcohol.  Do not drink caffeinated beverages such as coffee, soda, and some teas. You may drink decaffeinated coffee, soda, or tea.   Maintain a healthy weight.Do not use diet pills unless your health care provider approves. They may make heart problems worse.   Follow diet instructions as directed by your health care provider.  Exercise regularly as directed by your health care provider.  Keep all follow-up visits as directed by your health care provider. This is important. PREVENTION  The following substances can cause atrial fibrillation to recur:   Caffeinated beverages.  Alcohol.  Certain medicines, especially those used for breathing problems.  Certain herbs and  herbal medicines, such as those containing ephedra or ginseng.  Illegal drugs, such as cocaine and amphetamines. Sometimes medicines are given to prevent atrial fibrillation from recurring. Proper treatment of any underlying condition is also important in helping prevent recurrence.  SEEK MEDICAL CARE IF:  You notice a change in the rate, rhythm, or strength of your heartbeat.  You suddenly begin urinating more frequently.  You tire more easily when exerting yourself or exercising. SEEK IMMEDIATE MEDICAL CARE IF:   You have chest pain, abdominal pain, sweating, or weakness.  You feel nauseous.  You have shortness of breath.  You suddenly have swollen feet and ankles.  You feel dizzy.  Your face or limbs feel numb or weak.  You have a change in your vision or speech. MAKE SURE YOU:   Understand these instructions.  Will watch your condition.  Will get help right away if you are not doing well or get worse. Document Released: 03/18/2005 Document Revised: 08/02/2013 Document Reviewed: 04/28/2012 [facility] Patient Information 2015 [place]. This information is not intended to replace advice given to you by your health care provider. Make sure you discuss any questions you have with your health care provider.

## 2014-12-17 NOTE — ED Notes (Signed)
Patient able to dress independently and ambulate to wheelchair.  Gait steady and even.

## 2014-12-19 ENCOUNTER — Telehealth: Payer: Self-pay | Admitting: Cardiology

## 2014-12-19 ENCOUNTER — Inpatient Hospital Stay (HOSPITAL_COMMUNITY)
Admission: EM | Admit: 2014-12-19 | Discharge: 2014-12-22 | DRG: 605 | Disposition: A | Discharge status: Home / Self Care | Payer: Medicare Other | Attending: Internal Medicine | Admitting: Internal Medicine

## 2014-12-19 ENCOUNTER — Other Ambulatory Visit: Payer: Self-pay

## 2014-12-19 ENCOUNTER — Emergency Department (HOSPITAL_COMMUNITY): Payer: Medicare Other

## 2014-12-19 ENCOUNTER — Encounter (HOSPITAL_COMMUNITY): Payer: Self-pay | Admitting: Emergency Medicine

## 2014-12-19 DIAGNOSIS — S301XXA Contusion of abdominal wall, initial encounter: Secondary | ICD-10-CM | POA: Diagnosis present

## 2014-12-19 DIAGNOSIS — S301XXS Contusion of abdominal wall, sequela: Secondary | ICD-10-CM | POA: Diagnosis not present

## 2014-12-19 DIAGNOSIS — Z9049 Acquired absence of other specified parts of digestive tract: Secondary | ICD-10-CM | POA: Diagnosis present

## 2014-12-19 DIAGNOSIS — E780 Pure hypercholesterolemia: Secondary | ICD-10-CM | POA: Diagnosis present

## 2014-12-19 DIAGNOSIS — I482 Chronic atrial fibrillation, unspecified: Secondary | ICD-10-CM | POA: Diagnosis present

## 2014-12-19 DIAGNOSIS — D649 Anemia, unspecified: Secondary | ICD-10-CM | POA: Diagnosis not present

## 2014-12-19 DIAGNOSIS — Z79899 Other long term (current) drug therapy: Secondary | ICD-10-CM | POA: Diagnosis not present

## 2014-12-19 DIAGNOSIS — N179 Acute kidney failure, unspecified: Secondary | ICD-10-CM | POA: Diagnosis present

## 2014-12-19 DIAGNOSIS — Z87891 Personal history of nicotine dependence: Secondary | ICD-10-CM | POA: Diagnosis not present

## 2014-12-19 DIAGNOSIS — R52 Pain, unspecified: Secondary | ICD-10-CM | POA: Diagnosis not present

## 2014-12-19 DIAGNOSIS — D696 Thrombocytopenia, unspecified: Secondary | ICD-10-CM | POA: Diagnosis present

## 2014-12-19 DIAGNOSIS — Z954 Presence of other heart-valve replacement: Secondary | ICD-10-CM

## 2014-12-19 DIAGNOSIS — E785 Hyperlipidemia, unspecified: Secondary | ICD-10-CM | POA: Diagnosis present

## 2014-12-19 DIAGNOSIS — I10 Essential (primary) hypertension: Secondary | ICD-10-CM | POA: Diagnosis present

## 2014-12-19 DIAGNOSIS — I481 Persistent atrial fibrillation: Secondary | ICD-10-CM | POA: Diagnosis not present

## 2014-12-19 DIAGNOSIS — E861 Hypovolemia: Secondary | ICD-10-CM | POA: Diagnosis present

## 2014-12-19 DIAGNOSIS — E871 Hypo-osmolality and hyponatremia: Secondary | ICD-10-CM | POA: Diagnosis present

## 2014-12-19 DIAGNOSIS — M1389 Other specified arthritis, multiple sites: Secondary | ICD-10-CM | POA: Diagnosis present

## 2014-12-19 DIAGNOSIS — M109 Gout, unspecified: Secondary | ICD-10-CM | POA: Diagnosis present

## 2014-12-19 DIAGNOSIS — Z952 Presence of prosthetic heart valve: Secondary | ICD-10-CM

## 2014-12-19 DIAGNOSIS — Z66 Do not resuscitate: Secondary | ICD-10-CM | POA: Diagnosis present

## 2014-12-19 DIAGNOSIS — E039 Hypothyroidism, unspecified: Secondary | ICD-10-CM | POA: Diagnosis present

## 2014-12-19 DIAGNOSIS — Z881 Allergy status to other antibiotic agents status: Secondary | ICD-10-CM | POA: Diagnosis not present

## 2014-12-19 DIAGNOSIS — T148XXA Other injury of unspecified body region, initial encounter: Secondary | ICD-10-CM

## 2014-12-19 DIAGNOSIS — Z90710 Acquired absence of both cervix and uterus: Secondary | ICD-10-CM | POA: Diagnosis not present

## 2014-12-19 DIAGNOSIS — R011 Cardiac murmur, unspecified: Secondary | ICD-10-CM | POA: Diagnosis present

## 2014-12-19 DIAGNOSIS — D62 Acute posthemorrhagic anemia: Secondary | ICD-10-CM | POA: Diagnosis not present

## 2014-12-19 DIAGNOSIS — I4891 Unspecified atrial fibrillation: Secondary | ICD-10-CM | POA: Diagnosis not present

## 2014-12-19 DIAGNOSIS — Z7901 Long term (current) use of anticoagulants: Secondary | ICD-10-CM

## 2014-12-19 DIAGNOSIS — X58XXXA Exposure to other specified factors, initial encounter: Secondary | ICD-10-CM | POA: Diagnosis present

## 2014-12-19 DIAGNOSIS — I480 Paroxysmal atrial fibrillation: Secondary | ICD-10-CM | POA: Diagnosis present

## 2014-12-19 DIAGNOSIS — K219 Gastro-esophageal reflux disease without esophagitis: Secondary | ICD-10-CM | POA: Diagnosis present

## 2014-12-19 DIAGNOSIS — D500 Iron deficiency anemia secondary to blood loss (chronic): Secondary | ICD-10-CM | POA: Insufficient documentation

## 2014-12-19 DIAGNOSIS — Z853 Personal history of malignant neoplasm of breast: Secondary | ICD-10-CM | POA: Diagnosis not present

## 2014-12-19 DIAGNOSIS — Z888 Allergy status to other drugs, medicaments and biological substances status: Secondary | ICD-10-CM | POA: Diagnosis not present

## 2014-12-19 DIAGNOSIS — I771 Stricture of artery: Secondary | ICD-10-CM | POA: Diagnosis present

## 2014-12-19 DIAGNOSIS — T148 Other injury of unspecified body region: Secondary | ICD-10-CM | POA: Diagnosis not present

## 2014-12-19 DIAGNOSIS — S301XXD Contusion of abdominal wall, subsequent encounter: Secondary | ICD-10-CM | POA: Diagnosis not present

## 2014-12-19 HISTORY — DX: Contusion of abdominal wall, initial encounter: S30.1XXA

## 2014-12-19 LAB — CBC WITH DIFFERENTIAL/PLATELET
BASOS PCT: 0 %
Basophils Absolute: 0 10*3/uL (ref 0.0–0.1)
EOS ABS: 0 10*3/uL (ref 0.0–0.7)
EOS PCT: 0 %
HCT: 26.8 % — ABNORMAL LOW (ref 36.0–46.0)
HEMOGLOBIN: 9.2 g/dL — AB (ref 12.0–15.0)
Lymphocytes Relative: 5 %
Lymphs Abs: 0.5 10*3/uL — ABNORMAL LOW (ref 0.7–4.0)
MCH: 33.7 pg (ref 26.0–34.0)
MCHC: 34.3 g/dL (ref 30.0–36.0)
MCV: 98.2 fL (ref 78.0–100.0)
MONOS PCT: 4 %
Monocytes Absolute: 0.5 10*3/uL (ref 0.1–1.0)
NEUTROS PCT: 91 %
Neutro Abs: 10.2 10*3/uL — ABNORMAL HIGH (ref 1.7–7.7)
PLATELETS: 221 10*3/uL (ref 150–400)
RBC: 2.73 MIL/uL — AB (ref 3.87–5.11)
RDW: 13.3 % (ref 11.5–15.5)
WBC: 11.2 10*3/uL — AB (ref 4.0–10.5)

## 2014-12-19 LAB — CBC
HCT: 21.8 % — ABNORMAL LOW (ref 36.0–46.0)
HEMOGLOBIN: 7.6 g/dL — AB (ref 12.0–15.0)
MCH: 34.5 pg — AB (ref 26.0–34.0)
MCHC: 34.9 g/dL (ref 30.0–36.0)
MCV: 99.1 fL (ref 78.0–100.0)
PLATELETS: 196 10*3/uL (ref 150–400)
RBC: 2.2 MIL/uL — AB (ref 3.87–5.11)
RDW: 13.5 % (ref 11.5–15.5)
WBC: 9.8 10*3/uL (ref 4.0–10.5)

## 2014-12-19 LAB — MRSA PCR SCREENING: MRSA by PCR: NEGATIVE

## 2014-12-19 LAB — BASIC METABOLIC PANEL
Anion gap: 10 (ref 5–15)
BUN: 21 mg/dL — AB (ref 6–20)
CALCIUM: 10 mg/dL (ref 8.9–10.3)
CO2: 26 mmol/L (ref 22–32)
CREATININE: 1.05 mg/dL — AB (ref 0.44–1.00)
Chloride: 88 mmol/L — ABNORMAL LOW (ref 101–111)
GFR calc non Af Amer: 47 mL/min — ABNORMAL LOW (ref >60)
GFR, EST AFRICAN AMERICAN: 55 mL/min — AB (ref >60)
Glucose, Bld: 179 mg/dL — ABNORMAL HIGH (ref 65–99)
Potassium: 4.8 mmol/L (ref 3.5–5.1)
SODIUM: 124 mmol/L — AB (ref 135–145)

## 2014-12-19 LAB — PREPARE RBC (CROSSMATCH)

## 2014-12-19 LAB — PROTIME-INR
INR: 1.04 (ref 0.00–1.49)
PROTHROMBIN TIME: 13.8 s (ref 11.6–15.2)

## 2014-12-19 LAB — TROPONIN I: Troponin I: 0.03 ng/mL (ref <0.031)

## 2014-12-19 LAB — LACTIC ACID, PLASMA: LACTIC ACID, VENOUS: 1 mmol/L (ref 0.5–2.0)

## 2014-12-19 LAB — CREATININE, URINE, RANDOM: CREATININE, URINE: 133.63 mg/dL

## 2014-12-19 LAB — OSMOLALITY: OSMOLALITY: 265 mosm/kg — AB (ref 275–300)

## 2014-12-19 LAB — SODIUM, URINE, RANDOM: Sodium, Ur: 21 mmol/L

## 2014-12-19 MED ORDER — ONDANSETRON HCL 4 MG PO TABS: 4.0000 mg | ORAL_TABLET | Freq: Four times a day (QID) | ORAL | Status: DC | PRN

## 2014-12-19 MED ORDER — SODIUM CHLORIDE 0.9 % IV SOLN
INTRAVENOUS | Status: AC
  Administered 2014-12-19: 19:00:00 via INTRAVENOUS

## 2014-12-19 MED ORDER — LOSARTAN POTASSIUM 50 MG PO TABS
50.0000 mg | ORAL_TABLET | Freq: Two times a day (BID) | ORAL | Status: DC
  Administered 2014-12-19 – 2014-12-22 (×6): 50 mg via ORAL
  Filled 2014-12-19 (×6): qty 1

## 2014-12-19 MED ORDER — SODIUM CHLORIDE 0.9 % IV SOLN
Freq: Once | INTRAVENOUS | Status: AC
  Administered 2014-12-19: 22:00:00 via INTRAVENOUS

## 2014-12-19 MED ORDER — LEVOTHYROXINE SODIUM 88 MCG PO TABS
88.0000 ug | ORAL_TABLET | Freq: Every day | ORAL | Status: DC
  Administered 2014-12-20 – 2014-12-22 (×3): 88 ug via ORAL
  Filled 2014-12-19 (×3): qty 1

## 2014-12-19 MED ORDER — ALBUTEROL SULFATE (2.5 MG/3ML) 0.083% IN NEBU: 2.5000 mg | INHALATION_SOLUTION | Freq: Four times a day (QID) | RESPIRATORY_TRACT | Status: DC | PRN

## 2014-12-19 MED ORDER — FENTANYL CITRATE (PF) 100 MCG/2ML IJ SOLN
50.0000 ug | INTRAMUSCULAR | Status: DC | PRN
  Administered 2014-12-19 (×2): 50 ug via INTRAVENOUS
  Filled 2014-12-19 (×2): qty 2

## 2014-12-19 MED ORDER — OMEGA-3-ACID ETHYL ESTERS 1 G PO CAPS
1000.0000 mg | ORAL_CAPSULE | Freq: Two times a day (BID) | ORAL | Status: DC
  Administered 2014-12-19 – 2014-12-22 (×6): 1000 mg via ORAL
  Filled 2014-12-19 (×8): qty 1

## 2014-12-19 MED ORDER — DICYCLOMINE HCL 10 MG PO CAPS
10.0000 mg | ORAL_CAPSULE | Freq: Every day | ORAL | Status: DC | PRN
  Filled 2014-12-19: qty 1

## 2014-12-19 MED ORDER — MAGNESIUM HYDROXIDE 400 MG/5ML PO SUSP
30.0000 mL | Freq: Every day | ORAL | Status: AC
  Administered 2014-12-19: 30 mL via ORAL
  Filled 2014-12-19: qty 30

## 2014-12-19 MED ORDER — HYDROMORPHONE HCL 1 MG/ML IJ SOLN
0.5000 mg | INTRAMUSCULAR | Status: DC | PRN
  Administered 2014-12-19: 1 mg via INTRAVENOUS
  Filled 2014-12-19: qty 1

## 2014-12-19 MED ORDER — SODIUM CHLORIDE 0.9 % IV BOLUS (SEPSIS)
500.0000 mL | Freq: Once | INTRAVENOUS | Status: AC
  Administered 2014-12-19: 500 mL via INTRAVENOUS

## 2014-12-19 MED ORDER — ONDANSETRON HCL 4 MG/2ML IJ SOLN: 4.0000 mg | Freq: Four times a day (QID) | INTRAMUSCULAR | Status: DC | PRN

## 2014-12-19 MED ORDER — SODIUM CHLORIDE 0.9 % IJ SOLN
3.0000 mL | Freq: Two times a day (BID) | INTRAMUSCULAR | Status: DC
  Administered 2014-12-19 – 2014-12-20 (×2): 3 mL via INTRAVENOUS

## 2014-12-19 MED ORDER — ZOLPIDEM TARTRATE 5 MG PO TABS
5.0000 mg | ORAL_TABLET | Freq: Every evening | ORAL | Status: DC | PRN
  Administered 2014-12-20: 5 mg via ORAL
  Filled 2014-12-19: qty 1

## 2014-12-19 MED ORDER — ONDANSETRON HCL 4 MG/2ML IJ SOLN
4.0000 mg | Freq: Once | INTRAMUSCULAR | Status: AC
  Administered 2014-12-19: 4 mg via INTRAVENOUS
  Filled 2014-12-19: qty 2

## 2014-12-19 MED ORDER — METOPROLOL TARTRATE 12.5 MG HALF TABLET
12.5000 mg | ORAL_TABLET | Freq: Two times a day (BID) | ORAL | Status: DC
  Administered 2014-12-19 – 2014-12-20 (×2): 12.5 mg via ORAL
  Filled 2014-12-19 (×2): qty 1

## 2014-12-19 MED ORDER — FENTANYL CITRATE (PF) 100 MCG/2ML IJ SOLN
75.0000 ug | Freq: Once | INTRAMUSCULAR | Status: AC
  Administered 2014-12-19: 75 ug via INTRAVENOUS
  Filled 2014-12-19: qty 2

## 2014-12-19 MED ORDER — SIMVASTATIN 10 MG PO TABS
10.0000 mg | ORAL_TABLET | Freq: Every day | ORAL | Status: DC
  Administered 2014-12-19 – 2014-12-22 (×4): 10 mg via ORAL
  Filled 2014-12-19 (×4): qty 1

## 2014-12-19 MED ORDER — ALUM & MAG HYDROXIDE-SIMETH 200-200-20 MG/5ML PO SUSP: 30.0000 mL | Freq: Four times a day (QID) | ORAL | Status: DC | PRN

## 2014-12-19 MED ORDER — LIDOCAINE-PRILOCAINE 2.5-2.5 % EX CREA
1.0000 "application " | TOPICAL_CREAM | CUTANEOUS | Status: DC | PRN
  Filled 2014-12-19: qty 5

## 2014-12-19 MED ORDER — DIGOXIN 125 MCG PO TABS
0.1250 mg | ORAL_TABLET | Freq: Every day | ORAL | Status: DC
  Administered 2014-12-19 – 2014-12-22 (×4): 0.125 mg via ORAL
  Filled 2014-12-19 (×4): qty 1

## 2014-12-19 MED ORDER — LORAZEPAM 2 MG/ML IJ SOLN: 1.0000 mg | Freq: Once | INTRAMUSCULAR | Status: DC | PRN

## 2014-12-19 MED ORDER — LORATADINE 10 MG PO TABS
10.0000 mg | ORAL_TABLET | Freq: Every day | ORAL | Status: DC
  Administered 2014-12-19 – 2014-12-22 (×4): 10 mg via ORAL
  Filled 2014-12-19 (×4): qty 1

## 2014-12-19 MED ORDER — SODIUM CHLORIDE 0.9 % IV SOLN: INTRAVENOUS | Status: DC

## 2014-12-19 MED ORDER — ALPRAZOLAM 0.25 MG PO TABS: 0.2500 mg | ORAL_TABLET | Freq: Two times a day (BID) | ORAL | Status: DC | PRN

## 2014-12-19 MED ORDER — HYDROMORPHONE HCL 1 MG/ML IJ SOLN
1.0000 mg | Freq: Once | INTRAMUSCULAR | Status: AC
  Administered 2014-12-19: 1 mg via INTRAVENOUS
  Filled 2014-12-19: qty 1

## 2014-12-19 MED ORDER — ALBUTEROL SULFATE HFA 108 (90 BASE) MCG/ACT IN AERS: 1.0000 | INHALATION_SPRAY | Freq: Four times a day (QID) | RESPIRATORY_TRACT | Status: DC | PRN

## 2014-12-19 MED ORDER — ACETAMINOPHEN 500 MG PO TABS
1000.0000 mg | ORAL_TABLET | Freq: Four times a day (QID) | ORAL | Status: DC | PRN
  Administered 2014-12-19 – 2014-12-22 (×3): 1000 mg via ORAL
  Filled 2014-12-19 (×3): qty 2

## 2014-12-19 MED ORDER — ADULT MULTIVITAMIN W/MINERALS CH
1.0000 | ORAL_TABLET | Freq: Every day | ORAL | Status: DC
  Administered 2014-12-19 – 2014-12-22 (×4): 1 via ORAL
  Filled 2014-12-19 (×5): qty 1

## 2014-12-19 MED ORDER — NITROGLYCERIN 0.4 MG SL SUBL: 0.4000 mg | SUBLINGUAL_TABLET | SUBLINGUAL | Status: DC | PRN

## 2014-12-19 NOTE — Telephone Encounter (Signed)
Did call daughter and speak with her, patient on her way to ED   Left message to call back

## 2014-12-19 NOTE — H&P (Signed)
[facility] History and Physical                                                                                    Deborah [name], is a 81 y.o. female  MRN: [id number]   DOB - 01-Apr-1927  Admit Date - 12/19/2014  Outpatient Primary MD for the patient is [name], MD and Dr. Darlin Coco  Referring Physician:  Dr. Eulis Foster  Chief Complaint:   Chief Complaint  Patient presents with  . Abdominal Pain     HPI  Deborah [name]  is a 81 y.o. female, with aortic valve replacement on chronic anticoagulation, hypothyroidism, and anemia. She has had a history of chronic lower GI bleeding, and breast cancer. She presents to the emergency department today with left lower quadrant abdominal pain and was found to have a large rectal sheath hematoma. The patient reports that she first noticed severe left lower quadrant abdominal pain on Saturday. The pain comes and goes and feels like squeezing. It is located in her left lower quadrant and extends down her left hip. She has tried taking Tylenol but this has not relieved the pain. The pain has become progressively worse since Saturday. Today she is unable to ambulate due to pain. The patient reports that she was on chronic Coumadin but had recurrent GI bleeding. In February 2016, she was admitted to [facility] with a GI bleed. Coumadin was discontinued and she was placed on daily Lovenox injections. Since then she has had no further GI bleeding. She came to the emergency department on 9/17, and was diagnosed with a hematoma at that time, but was discharged home and continued taking Lovenox.  She sees Dr. Jana Hakim for chronic anemia and last received IV iron 10 days ago.  In the emergency department her hemoglobin is currently at 9.2. On 9/9 her hemoglobin was 13.0. Her sodium is 124, chloride 88 creatinine is mildly elevated at 1.05. CT abdomen pelvis revealed an 18.6 x 15.7 cm hematoma. She also has severe degenerative changes in her spine noted  on CT.  Review of Systems   In addition to the HPI above,  No Fever-chills, No Headache, No changes with Vision or hearing, No problems swallowing food or Liquids, No Chest pain, Cough or Shortness of Breath, No Abdominal pain, No Nausea or Vomiting, Bowel movements are regular, No Blood in stool or Urine, No dysuria, No new skin rashes or bruises, No new joints pains-aches,  No new weakness, tingling, numbness in any extremity, ++++ Decreased appetite for the last several days, poor by mouth intake A full 10 point Review of Systems was done, except as stated above, all other Review of Systems were negative.  Past Medical History  Past Medical History  Diagnosis Date  . Hypothyroidism   . Stomach problems     seeing Dr Watt Climes  . Hypertension   . GERD (gastroesophageal reflux disease)   . Anemia   . Arrhythmia     Atrial Fibrillation  . Breast cancer     "right; S/P lumpectomy, chemo, XRT"   . Complication of anesthesia     "once I'm awakened, I don't sleep til the following  night"  . High cholesterol   . Heart murmur   . History of blood transfusion 1996; ~ 2013    "related to valve replacement; GI bleeding"   . Chronic lower GI bleeding   . Arthritis     "fingers, toes, hips; qwhere" (05/19/2013)  . Gout   . Pneumonia 07/2011    Past Surgical History  Procedure Laterality Date  . Arteriogram  01/12    NO RAS   . Cardioversion      X 2  . Cardiac catheterization    . Cataract extraction w/ intraocular lens  implant, bilateral Bilateral     bilateral cataract with lens implants  . Cholecystectomy    . Abdominal hysterectomy    . Appendectomy    . Colonoscopy with propofol N/A 11/24/2012    Procedure: COLONOSCOPY WITH PROPOFOL;  Surgeon: Jeryl Columbia, MD;  Location: [facility];  Service: Endoscopy;  Laterality: N/A;  . Breast biopsy Right   . Breast lumpectomy Right   . Aortic valve replacement  1996    [employer]  . Cardiac valve replacement  1996       Social History Social History  Substance Use Topics  . Smoking status: Former Smoker -- 1.00 packs/day for 20 years    Types: Cigarettes    Quit date: 04/01/1988  . Smokeless tobacco: Never Used  . Alcohol Use: 4.8 oz/week    4 Glasses of wine, 4 Shots of liquor per week     Comment: burbon or glass wine daily   she reports that she drinks 2 shots of bourbon a day, she lives in independent living at [place] and ambulated without a walker or cane prior to admission. Her husband passed away 2 years ago.  Family History Family History  Problem Relation Age of Onset  . Dementia Mother   . Coronary artery disease Father    she knows of no leukemia,  hemophilia, clotting disorders or bleeding disorders in the family  Prior to Admission medications   Medication Sig Start Date End Date Taking? Authorizing Provider  acetaminophen (TYLENOL) 500 MG tablet Take 1,000 mg by mouth every 6 (six) hours as needed for headache.   Yes Historical Provider, MD  albuterol (PROVENTIL HFA;VENTOLIN HFA) 108 (90 BASE) MCG/ACT inhaler Inhale 1-2 puffs into the lungs every 6 (six) hours as needed for wheezing or shortness of breath. 09/15/14  Yes Susanne Borders, NP  allopurinol (ZYLOPRIM) 300 MG tablet TAKE ONE TABLET EACH DAY 11/22/14  Yes Darlin Coco, MD  amLODipine (NORVASC) 5 MG tablet Take 1 tablet (5 mg total) by mouth 2 (two) times daily. 08/02/14  Yes Darlin Coco, MD  calcium-vitamin D 500 MG tablet Take 1 tablet by mouth 2 (two) times daily.     Yes Historical Provider, MD  dicyclomine (BENTYL) 10 MG capsule Take 10 mg by mouth daily as needed for spasms.    Yes Historical Provider, MD  [place] 125 MCG tablet TAKE ONE TABLET EACH DAY Patient taking differently: TAKE ONE TABLET EACH DAY BY MOUTH 10/31/14  Yes Darlin Coco, MD  enoxaparin (LOVENOX) 100 MG/ML injection INJECT 0.39m ('85mg'$  TOTAL) INTO SKIN DAILY 09/26/14  Yes GChauncey Cruel MD  fexofenadine ([facility]) 180 MG  tablet Take 180 mg by mouth daily as needed for allergies.    Yes Historical Provider, MD  furosemide (LASIX) 40 MG tablet Take 40 mg by mouth daily as needed (for swelling).   Yes Historical Provider, MD  hydrochlorothiazide (MICROZIDE) 12.5  MG capsule Take 1 capsule (12.5 mg total) by mouth 2 (two) times daily. 09/21/14  Yes Darlin Coco, MD  levothyroxine (SYNTHROID, LEVOTHROID) 88 MCG tablet Take 88 mcg by mouth daily before breakfast.   Yes Historical Provider, MD  lidocaine-prilocaine (EMLA) cream Apply 1 application topically as needed (prior to infusion at porta cath site). 09/01/14  Yes [name], MD  losartan (COZAAR) 50 MG tablet Take 1 tablet (50 mg total) by mouth 2 (two) times daily. 07/29/14  Yes Darlin Coco, MD  metoprolol tartrate (LOPRESSOR) 25 MG tablet Take 12.5 mg by mouth 2 (two) times daily. 1/2 twice a day   Yes Historical Provider, MD  multivitamin [facility]) per tablet Take 1 tablet by mouth daily.    Yes Historical Provider, MD  NITROSTAT 0.4 MG SL tablet   09/12/14  Yes Historical Provider, MD  Omega-3 Fatty Acids (FISH OIL) 1000 MG CAPS Take 1,000 mg by mouth 2 (two) times daily.    Yes Historical Provider, MD  potassium chloride SA (K-DUR,KLOR-CON) 20 MEQ tablet Take 20 mEq by mouth daily.   Yes Historical Provider, MD  simvastatin (ZOCOR) 20 MG tablet Take 10 mg by mouth daily.   Yes Historical Provider, MD  zolpidem (AMBIEN CR) 12.5 MG CR tablet TAKE ONE TABLET AT BEDTIME AS NEEDED Patient taking differently: TAKE ONE TABLET AT BEDTIME AS NEEDED FOR SLEEP 08/07/14  Yes Darlin Coco, MD    Allergies  Allergen Reactions  . Amiodarone Shortness Of Breath  . Alendronate Sodium     GI upset  . Macrodantin [Nitrofurantoin]     GI upset  . Meclizine Swelling    tongue  . Norvasc [Amlodipine Besylate]     edema  . Tussionex Pennkinetic Er [Hydrocod Polst-Cpm Polst Er] Other (See Comments)    Reaction unknown    Physical Exam  Vitals  Blood  pressure 138/57, pulse 95, temperature 97.9 F (36.6 C), temperature source Oral, resp. rate 24, height 5' (1.524 m), weight 56.246 kg (124 lb), SpO2 97 %.   General: Well-developed, well-nourished, very pale elderly female lying in bed in NAD, daughter at bedside  Psych:  Normal affect and insight, Not Suicidal or Homicidal, Awake Alert, Oriented X 3.  Neuro:   No F.N deficits, ALL C.Nerves Intact, Strength 5/5 all 4 extremities, Sensation intact all 4 extremities.  ENT:  Ears and Eyes appear Normal, Conjunctivae clear, PER. Moist oral mucosa without erythema or exudates.  Neck:  Supple, No lymphadenopathy appreciated  Respiratory:  Symmetrical chest wall movement, Good air movement bilaterally, CTAB. Port-A-Cath in place in left chest  Cardiac:  RRR, No Murmurs, trace bilateral LE edema noted, no JVD.    Abdomen:  Positive bowel sounds, her left lower quadrant has a large very firm area, some of which is covered with deep purple ecchymosis and some normal skin tone without ecchymosis  Skin:  No Cyanosis, Normal Skin Turgor, No Skin Rash   Extremities:  Able to move all 4. 5/5 strength in each,  no effusions.  Data Review  CBC  Recent Labs Lab 12/19/14 1120  WBC 11.2*  HGB 9.2*  HCT 26.8*  PLT 221  MCV 98.2  MCH 33.7  MCHC 34.3  RDW 13.3  LYMPHSABS 0.5*  MONOABS 0.5  EOSABS 0.0  BASOSABS 0.0    Chemistries   Recent Labs Lab 12/19/14 1120  NA 124*  K 4.8  CL 88*  CO2 26  GLUCOSE 179*  BUN 21*  CREATININE 1.05*  CALCIUM 10.0  Coagulation profile  Recent Labs Lab 12/19/14 1120  INR 1.04     Urinalysis    Component Value Date/Time   COLORURINE YELLOW 12/17/2014 1805   APPEARANCEUR CLOUDY* 12/17/2014 1805   LABSPEC 1.010 12/17/2014 1805   LABSPEC 1.010 02/08/2014 1128   PHURINE 6.0 12/17/2014 1805   PHURINE 7.5 02/08/2014 1128   GLUCOSEU NEGATIVE 12/17/2014 1805   GLUCOSEU Negative 02/08/2014 1128   GLUCOSEU NEGATIVE 04/07/2013 1123    HGBUR TRACE* 12/17/2014 1805   HGBUR Trace 02/08/2014 1128   BILIRUBINUR NEGATIVE 12/17/2014 1805   BILIRUBINUR Negative 02/08/2014 1128   KETONESUR 15* 12/17/2014 1805   KETONESUR Negative 02/08/2014 1128   PROTEINUR 100* 12/17/2014 1805   PROTEINUR Negative 02/08/2014 1128   UROBILINOGEN 0.2 12/17/2014 1805   UROBILINOGEN 0.2 02/08/2014 1128   NITRITE NEGATIVE 12/17/2014 1805   NITRITE Negative 02/08/2014 1128   LEUKOCYTESUR TRACE* 12/17/2014 1805   LEUKOCYTESUR Negative 02/08/2014 1128    Imaging results:   Ct Abdomen Pelvis Wo Contrast  12/19/2014   CLINICAL DATA:  Left abdominal pain since Saturday. Pain is getting progressively worse. Patient is on blood thinners.  EXAM: CT ABDOMEN AND PELVIS WITHOUT CONTRAST  TECHNIQUE: Multidetector CT imaging of the abdomen and pelvis was performed following the standard protocol without IV contrast.  COMPARISON:  12/31/2011 and 11/08/2014  FINDINGS: Lung bases are clear. Mild enlargement of the heart appears stable. There is no evidence for free intraperitoneal air.  There is a large heterogeneous structure involving the left rectus sheath compatible with a hematoma. This hematoma measures 18.6 x 11.0 x 15.7 cm. Active bleeding cannot be evaluated on this non contrast examination. There is subcutaneous edema and stranding in the left anterior abdomen adjacent to the hematoma.  Again noted are innumerable renal cysts. One of the largest cysts measures up to 6.1 cm in the left kidney. There is compression on the left kidney and left abdominal structures from the large rectus sheath hematoma.  Again noted is a 1.8 cm low-density structure in left hepatic lobe which probably represents a hepatic cyst. The gallbladder has been removed. No gross abnormality to the spleen or pancreas.  Atherosclerotic calcifications in the aorta and visceral arteries without aneurysm. There is subcutaneous edema extending into the lower anterior abdominal and pelvic  subcutaneous tissues. Fluid in the urinary bladder. Uterus is surgically absent. Stable nodular structures near the vaginal cuff. No acute abnormality or dilatation to the small or large bowel. Stable mild fullness in the left adrenal gland. Normal appearance of the right adrenal gland.  There is severe disc space loss at L1-L2 with approximately 6 mm of retrolisthesis at L1-L2. Vacuum disc phenomena at T12-L1. There is extensive subchondral sclerosis and endplate changes at W9-N9. Disease at L1-L2 is similar to the exam on 11/08/2014.  IMPRESSION: Large left-sided rectus sheath hematoma, measuring up to 18.6 cm.  Innumerable bilateral renal cysts.  Severe disc space disease at L1-L2.  These results were called by telephone at the time of interpretation on 12/19/2014 at 2:20 pm to Dr. Daleen Bo , who verbally acknowledged these results.   Electronically Signed   By: Markus Daft M.D.   On: 12/19/2014 14:21    My personal review of EKG: Ventricle rate 95, new flipped T's in V3, QTC not prolonged.   Assessment & Plan  Principal Problem:   Rectus sheath hematoma Active Problems:   S/P aortic valve replacement with metallic valve   Hypothyroidism   Hyponatremia   Long term current  use of anticoagulant therapy   Atrial fibrillation   Dyslipidemia   Pain   Acute blood loss anemia   Hematoma   Rectus sheath hematoma Likely spontaneous. Due to Lovenox injections. EDP consult to interventional radiology. Who plans to obtain an MR angiogram to evaluate for active bleeding. If there is active bleeding they may be able to embolize it. Have consulted cardiology with regard to anticoagulation. Consider heparin drip. Patient is currently having severe pain due to hematoma. Placed on 0.5-1 mg Dilaudid every 4 when necessary severe pain. Ativan, 1 mg IV when necessary claustrophobia with MRI.  Per IR patient is on a clear liquid diet until it is determined whether or not she will have a procedure. Gen.  surgery consult was considered, but the family declined general surgery consult for now.  Acute blood loss anemia Secondary to hematoma. Patient's PTT is 13.8, INR is 1.04. Platelets are 221. Will go ahead and transfuse 1 unit PRBCs. CBC every 6 hours.  Given aortic valve recommend transfusion between 9-10.  Hyponatremia Chronically in the 128-134 range. Lower today likely due to decreased by mouth intake. Will check urine osmoles, creatinine, sodium.  Serum osmoles, creatinine, sodium. Placed on gentle IV hydration. Will receive 1 unit of PRBCs.  Atrial fibrillation Currently rate controlled. Continue metoprolol. Cardiology consultation regarding anticoagulation.  Aortic valve replacement Mechanical valve placed in the 1990s. The patient on daily Lovenox. Cardiology consulted with regards to anti-coagulation  Hypothyroidism Continue levothyroxine   Consultants Called:  Cardiology, Interventional Radiology.  Family Communication:   Daughter at bedside  Code Status:  DNR/DNI,  Pressors, Bipap are OK.  Condition:  Guarded.  Potential Disposition: Lives at [facility].  Will hopefully be discharged within 2-3 days.  Will need PT / OT evals to determine if she will require a higher level of care.  Time spent in minutes : [place],  PA-C on 12/19/2014 at 5:56 PM Between 7am to 7pm - Pager - [phone] After 7pm go to [email or web address] - password [email or web address] And look for the night coverage person covering me after hours  [facility]

## 2014-12-19 NOTE — Consult Note (Signed)
CONSULTATION NOTE  Reason for Consult: Anticoagulation management  Requesting Physician: Dr. Karleen Hampshire  Cardiologist: Dr. Mare Ferrari  HPI: This is a 81 y.o. female with a past medical history significant for chronic atrial fibrillation and mechanical aortic valve replacement in 1996. She is on daily Lovenox injections in place of warfarin due to the fact that she had bleeding problems on warfarin requiring recurrent transfusions. She also has a Port-A-Cath in place in her left upper chest because of poor venous access. She also has essential hypertension, dyslipidemia, hypothyroidism and iron deficiency anemia secondary to occult blood loss. She now presents with abdominal pain. She first noticed severe left lower quadrant abdominal pain on Saturday. The pain extends down to her left hip. A CT scan performed in the ER for a low hemoglobin of 9.2, demonstrated an 18.6 x 15.7 cm rectus sheath hematoma. Cardiology is asked to consult regarding anticoagulation management.  PMHx:  Past Medical History  Diagnosis Date  . Hypothyroidism   . Stomach problems     seeing Dr Watt Climes  . Hypertension   . GERD (gastroesophageal reflux disease)   . Anemia   . Arrhythmia     Atrial Fibrillation  . Breast cancer     "right; S/P lumpectomy, chemo, XRT"   . Complication of anesthesia     "once I'm awakened, I don't sleep til the following night"  . High cholesterol   . Heart murmur   . History of blood transfusion 1996; ~ 2013    "related to valve replacement; GI bleeding"   . Chronic lower GI bleeding   . Arthritis     "fingers, toes, hips; qwhere" (05/19/2013)  . Gout   . Pneumonia 07/2011   Past Surgical History  Procedure Laterality Date  . Arteriogram  01/12    NO RAS   . Cardioversion      X 2  . Cardiac catheterization    . Cataract extraction w/ intraocular lens  implant, bilateral Bilateral     bilateral cataract with lens implants  . Cholecystectomy    . Abdominal hysterectomy      . Appendectomy    . Colonoscopy with propofol N/A 11/24/2012    Procedure: COLONOSCOPY WITH PROPOFOL;  Surgeon: Jeryl Columbia, MD;  Location: [facility];  Service: Endoscopy;  Laterality: N/A;  . Breast biopsy Right   . Breast lumpectomy Right   . Aortic valve replacement  1996    St. Jude  . Cardiac valve replacement  1996    FAMHx: Family History  Problem Relation Age of Onset  . Dementia Mother   . Coronary artery disease Father     SOCHx:  reports that she quit smoking about 26 years ago. Her smoking use included Cigarettes. She has a 20 pack-year smoking history. She has never used smokeless tobacco. She reports that she drinks about 4.8 oz of alcohol per week. She reports that she does not use illicit drugs.  ALLERGIES: Allergies  Allergen Reactions  . Amiodarone Shortness Of Breath  . Alendronate Sodium     GI upset  . Macrodantin [Nitrofurantoin]     GI upset  . Meclizine Swelling    tongue  . Norvasc [Amlodipine Besylate]     edema  . Tussionex Pennkinetic Er [Hydrocod Polst-Cpm Polst Er] Other (See Comments)    Reaction unknown    ROS: A comprehensive review of systems was negative except for: Gastrointestinal: positive for abdominal pain  HOME MEDICATIONS: Prescriptions prior to admission  Medication Sig  Dispense Refill Last Dose  . acetaminophen (TYLENOL) 500 MG tablet Take 1,000 mg by mouth every 6 (six) hours as needed for headache.   12/19/2014 at Unknown time  . albuterol (PROVENTIL HFA;VENTOLIN HFA) 108 (90 BASE) MCG/ACT inhaler Inhale 1-2 puffs into the lungs every 6 (six) hours as needed for wheezing or shortness of breath. 1 Inhaler 2 unknown  . allopurinol (ZYLOPRIM) 300 MG tablet TAKE ONE TABLET EACH DAY 30 tablet 5 12/18/2014 at Unknown time  . amLODipine (NORVASC) 5 MG tablet Take 1 tablet (5 mg total) by mouth 2 (two) times daily. 180 tablet 3 12/18/2014 at Unknown time  . calcium-vitamin D 500 MG tablet Take 1 tablet by mouth 2 (two) times daily.      12/18/2014 at Unknown time  . dicyclomine (BENTYL) 10 MG capsule Take 10 mg by mouth daily as needed for spasms.    12/18/2014 at Unknown time  . DIGOX 125 MCG tablet TAKE ONE TABLET EACH DAY (Patient taking differently: TAKE ONE TABLET EACH DAY BY MOUTH) 30 tablet 3 12/18/2014 at Unknown time  . enoxaparin (LOVENOX) 100 MG/ML injection INJECT 0.60m (823mTOTAL) INTO SKIN DAILY 30 mL 3 12/18/2014 at Unknown time  . fexofenadine (ALLEGRA ALLERGY) 180 MG tablet Take 180 mg by mouth daily as needed for allergies.    12/18/2014 at Unknown time  . furosemide (LASIX) 40 MG tablet Take 40 mg by mouth daily as needed (for swelling).   unknown  . hydrochlorothiazide (MICROZIDE) 12.5 MG capsule Take 1 capsule (12.5 mg total) by mouth 2 (two) times daily. 180 capsule 3 12/18/2014 at Unknown time  . levothyroxine (SYNTHROID, LEVOTHROID) 88 MCG tablet Take 88 mcg by mouth daily before breakfast.   12/18/2014 at Unknown time  . lidocaine-prilocaine (EMLA) cream Apply 1 application topically as needed (prior to infusion at porta cath site). 30 g 1 12/18/2014 at Unknown time  . losartan (COZAAR) 50 MG tablet Take 1 tablet (50 mg total) by mouth 2 (two) times daily. 60 tablet 5 12/18/2014 at Unknown time  . metoprolol tartrate (LOPRESSOR) 25 MG tablet Take 12.5 mg by mouth 2 (two) times daily. 1/2 twice a day   12/18/2014 at 9p  . multivitamin ([facility] tablet Take 1 tablet by mouth daily.    12/18/2014 at Unknown time  . NITROSTAT 0.4 MG SL tablet    1 unknown  . Omega-3 Fatty Acids (FISH OIL) 1000 MG CAPS Take 1,000 mg by mouth 2 (two) times daily.    12/18/2014 at Unknown time  . potassium chloride SA (K-DUR,KLOR-CON) 20 MEQ tablet Take 20 mEq by mouth daily.   12/18/2014 at Unknown time  . simvastatin (ZOCOR) 20 MG tablet Take 10 mg by mouth daily.   12/18/2014 at Unknown time  . zolpidem (AMBIEN CR) 12.5 MG CR tablet TAKE ONE TABLET AT BEDTIME AS NEEDED (Patient taking differently: TAKE ONE TABLET AT BEDTIME AS  NEEDED FOR SLEEP) 30 tablet 5 12/18/2014 at Unknown time    HOSPITAL MEDICATIONS: I have reviewed the patient's current medications.  VITALS: Blood pressure 138/57, pulse 95, temperature 97.9 F (36.6 C), temperature source Oral, resp. rate 24, height 5' (1.524 m), weight 124 lb (56.246 kg), SpO2 97 %.  PHYSICAL EXAM: General appearance: alert, mild distress and pale Neck: no carotid bruit and no JVD Lungs: clear to auscultation bilaterally Heart: regular rate and rhythm, S1, S2 normal and Sharp mechanical valve sounds Abdomen: moderate TTP in the LLQ Extremities: extremities normal, atraumatic, no cyanosis or edema  Pulses: 2+ and symmetric Skin: small amount of ecchymosis in the abdomen Neurologic: Grossly normal Psych: Pleasant  LABS: Results for orders placed or performed during the hospital encounter of 12/19/14 (from the past 48 hour(s))  Basic metabolic panel     Status: Abnormal   Collection Time: 12/19/14 11:20 AM  Result Value Ref Range   Sodium 124 (L) 135 - 145 mmol/L   Potassium 4.8 3.5 - 5.1 mmol/L   Chloride 88 (L) 101 - 111 mmol/L   CO2 26 22 - 32 mmol/L   Glucose, Bld 179 (H) 65 - 99 mg/dL   BUN 21 (H) 6 - 20 mg/dL   Creatinine, Ser 1.05 (H) 0.44 - 1.00 mg/dL   Calcium 10.0 8.9 - 10.3 mg/dL   GFR calc non Af Amer 47 (L) >60 mL/min   GFR calc Af Amer 55 (L) >60 mL/min    Comment: (NOTE) The eGFR has been calculated using the CKD EPI equation. This calculation has not been validated in all clinical situations. eGFR's persistently <60 mL/min signify possible Chronic Kidney Disease.    Anion gap 10 5 - 15  CBC with Differential     Status: Abnormal   Collection Time: 12/19/14 11:20 AM  Result Value Ref Range   WBC 11.2 (H) 4.0 - 10.5 K/uL   RBC 2.73 (L) 3.87 - 5.11 MIL/uL   Hemoglobin 9.2 (L) 12.0 - 15.0 g/dL   HCT 26.8 (L) 36.0 - 46.0 %   MCV 98.2 78.0 - 100.0 fL   MCH 33.7 26.0 - 34.0 pg   MCHC 34.3 30.0 - 36.0 g/dL   RDW 13.3 11.5 - 15.5 %    Platelets 221 150 - 400 K/uL   Neutrophils Relative % 91 %   Neutro Abs 10.2 (H) 1.7 - 7.7 K/uL   Lymphocytes Relative 5 %   Lymphs Abs 0.5 (L) 0.7 - 4.0 K/uL   Monocytes Relative 4 %   Monocytes Absolute 0.5 0.1 - 1.0 K/uL   Eosinophils Relative 0 %   Eosinophils Absolute 0.0 0.0 - 0.7 K/uL   Basophils Relative 0 %   Basophils Absolute 0.0 0.0 - 0.1 K/uL  Protime-INR     Status: None   Collection Time: 12/19/14 11:20 AM  Result Value Ref Range   Prothrombin Time 13.8 11.6 - 15.2 seconds   INR 1.04 0.00 - 1.49    IMAGING: Ct Abdomen Pelvis Wo Contrast  12/19/2014   CLINICAL DATA:  Left abdominal pain since Saturday. Pain is getting progressively worse. Patient is on blood thinners.  EXAM: CT ABDOMEN AND PELVIS WITHOUT CONTRAST  TECHNIQUE: Multidetector CT imaging of the abdomen and pelvis was performed following the standard protocol without IV contrast.  COMPARISON:  12/31/2011 and 11/08/2014  FINDINGS: Lung bases are clear. Mild enlargement of the heart appears stable. There is no evidence for free intraperitoneal air.  There is a large heterogeneous structure involving the left rectus sheath compatible with a hematoma. This hematoma measures 18.6 x 11.0 x 15.7 cm. Active bleeding cannot be evaluated on this non contrast examination. There is subcutaneous edema and stranding in the left anterior abdomen adjacent to the hematoma.  Again noted are innumerable renal cysts. One of the largest cysts measures up to 6.1 cm in the left kidney. There is compression on the left kidney and left abdominal structures from the large rectus sheath hematoma.  Again noted is a 1.8 cm low-density structure in left hepatic lobe which probably represents a hepatic cyst. The gallbladder  has been removed. No gross abnormality to the spleen or pancreas.  Atherosclerotic calcifications in the aorta and visceral arteries without aneurysm. There is subcutaneous edema extending into the lower anterior abdominal and  pelvic subcutaneous tissues. Fluid in the urinary bladder. Uterus is surgically absent. Stable nodular structures near the vaginal cuff. No acute abnormality or dilatation to the small or large bowel. Stable mild fullness in the left adrenal gland. Normal appearance of the right adrenal gland.  There is severe disc space loss at L1-L2 with approximately 6 mm of retrolisthesis at L1-L2. Vacuum disc phenomena at T12-L1. There is extensive subchondral sclerosis and endplate changes at Q1-J9. Disease at L1-L2 is similar to the exam on 11/08/2014.  IMPRESSION: Large left-sided rectus sheath hematoma, measuring up to 18.6 cm.  Innumerable bilateral renal cysts.  Severe disc space disease at L1-L2.  These results were called by telephone at the time of interpretation on 12/19/2014 at 2:20 pm to Dr. Daleen Bo , who verbally acknowledged these results.   Electronically Signed   By: Markus Daft M.D.   On: 12/19/2014 14:21    HOSPITAL DIAGNOSES: Principal Problem:   Rectus sheath hematoma Active Problems:   Hypothyroidism   S/P aortic valve replacement with metallic valve   Hyponatremia   Long term current use of anticoagulant therapy   Atrial fibrillation   Dyslipidemia   Pain   Acute blood loss anemia   Hematoma   IMPRESSION: 1. Acute blood loss anemia secondary to rectus sheath hematoma 2. Mechanical aVR 3. Paroxysmal atrial fibrillation  RECOMMENDATION: 1. This is a difficult situation as Ms. Clos had bleeding complications on warfarin and had been doing fairly well on Lovenox injections, but now has a relative contraindication to Lovenox with rectus sheath hematoma and significant abdominal pain. She may need to transition back to warfarin. At this time it is safe to hold anticoagulation for at least up to a week without significant concern for mechanical valve thrombosis. I agree with transfusion. I will copy Dr. Mare Ferrari on this consultation to see if he has any insight as to long-term  anticoagulation options. Again, it may be safer to go back onto warfarin as it is at least readily reversible.  Thanks for the consultation.  Time Spent Directly with Patient: 30 minutes  Pixie Casino, MD, [facility] Attending Cardiologist [place] 12/19/2014, 6:15 PM

## 2014-12-19 NOTE — Telephone Encounter (Signed)
Left message to call back  

## 2014-12-19 NOTE — Telephone Encounter (Signed)
Spoke with daughter and patient is on way to ED for evaluation of abdominal pain

## 2014-12-19 NOTE — Telephone Encounter (Signed)
Follow up      Daughter want [facility] to know that they are taking pt to the ER because she is in lots of pain from a hematoma in her abd.  She said the pain is causing "stress" on her heart.  Wanted [facility] Harbour to know

## 2014-12-19 NOTE — ED Notes (Signed)
IR is with patient at this time in [place]; Vicente Males, RN stated she will bring patient over to room Pod C24 when IR is finished

## 2014-12-19 NOTE — ED Provider Notes (Signed)
CSN: [id number]     Arrival date & time 12/19/14  1008 History   First MD Initiated Contact with Patient 12/19/14 1013     Chief Complaint  Patient presents with  . Abdominal Pain     (Consider location/radiation/quality/duration/timing/severity/associated sxs/prior Treatment) HPI    Deborah [name] is a 81 y.o. female  who presents for evaluation of persistent abdominal pain, with enlarging hematoma of the abdominal wall. The problem started 3 days ago, without trauma. She was evaluated at that time and discharged with symptomatic treatment. She feels like things have worsened. She denies vomiting but has decreased appetite, and decreased stooling for 3 days. No weakness, dizziness, or vomiting. No fever. There has been no trauma. No similar problems in the past. No other recent illnesses. She is continuing to use her Lovenox. She has a port in the left chest. She has a history of gastrointestinal bleeding. She is anticoagulated for a mechanical heart valve .There are no other known modifying factors.  Past Medical History  Diagnosis Date  . Hypothyroidism   . Stomach problems     seeing Dr Watt Climes  . Hypertension   . GERD (gastroesophageal reflux disease)   . Anemia   . Arrhythmia     Atrial Fibrillation  . Breast cancer     "right; S/P lumpectomy, chemo, XRT"   . Complication of anesthesia     "once I'm awakened, I don't sleep til the following night"  . High cholesterol   . Heart murmur   . History of blood transfusion 1996; ~ 2013    "related to valve replacement; GI bleeding"   . Chronic lower GI bleeding   . Arthritis     "fingers, toes, hips; qwhere" (05/19/2013)  . Gout   . Pneumonia 07/2011   Past Surgical History  Procedure Laterality Date  . Arteriogram  01/12    NO RAS   . Cardioversion      X 2  . Cardiac catheterization    . Cataract extraction w/ intraocular lens  implant, bilateral Bilateral     bilateral cataract with lens implants  . Cholecystectomy     . Abdominal hysterectomy    . Appendectomy    . Colonoscopy with propofol N/A 11/24/2012    Procedure: COLONOSCOPY WITH PROPOFOL;  Surgeon: Jeryl Columbia, MD;  Location: [facility];  Service: Endoscopy;  Laterality: N/A;  . Breast biopsy Right   . Breast lumpectomy Right   . Aortic valve replacement  1996    St. Jude  . Cardiac valve replacement  1996   Family History  Problem Relation Age of Onset  . Dementia Mother   . Coronary artery disease Father    Social History  Substance Use Topics  . Smoking status: Former Smoker -- 1.00 packs/day for 20 years    Types: Cigarettes    Quit date: 04/01/1988  . Smokeless tobacco: Never Used  . Alcohol Use: 4.8 oz/week    4 Glasses of wine, 4 Shots of liquor per week     Comment: burbon or glass wine daily   OB History    No data available     Review of Systems  All other systems reviewed and are negative.     Allergies  Amiodarone; Alendronate sodium; Macrodantin; Meclizine; Norvasc; and Tussionex pennkinetic er  Home Medications   Prior to Admission medications   Medication Sig Start Date End Date Taking? Authorizing Provider  acetaminophen (TYLENOL) 500 MG tablet Take 1,000 mg by mouth  every 6 (six) hours as needed for headache.   Yes Historical Provider, MD  albuterol (PROVENTIL HFA;VENTOLIN HFA) 108 (90 BASE) MCG/ACT inhaler Inhale 1-2 puffs into the lungs every 6 (six) hours as needed for wheezing or shortness of breath. 09/15/14  Yes Susanne Borders, NP  allopurinol (ZYLOPRIM) 300 MG tablet TAKE ONE TABLET EACH DAY 11/22/14  Yes Darlin Coco, MD  amLODipine (NORVASC) 5 MG tablet Take 1 tablet (5 mg total) by mouth 2 (two) times daily. 08/02/14  Yes Darlin Coco, MD  calcium-vitamin D 500 MG tablet Take 1 tablet by mouth 2 (two) times daily.     Yes Historical Provider, MD  celecoxib (CELEBREX) 200 MG capsule Take 1 capsule (200 mg total) by mouth daily. 10/13/14  Yes Darlin Coco, MD  dicyclomine (BENTYL) 10 MG  capsule Take 10 mg by mouth daily as needed for spasms.    Yes Historical Provider, MD  [place] 125 MCG tablet TAKE ONE TABLET EACH DAY Patient taking differently: TAKE ONE TABLET EACH DAY BY MOUTH 10/31/14  Yes Darlin Coco, MD  enoxaparin (LOVENOX) 100 MG/ML injection INJECT 0.53m ('85mg'$  TOTAL) INTO SKIN DAILY 09/26/14  Yes GChauncey Cruel MD  fexofenadine ([facility]) 180 MG tablet Take 180 mg by mouth daily as needed for allergies.    Yes Historical Provider, MD  furosemide (LASIX) 40 MG tablet Take 40 mg by mouth daily as needed (for swelling).   Yes Historical Provider, MD  hydrochlorothiazide (MICROZIDE) 12.5 MG capsule Take 1 capsule (12.5 mg total) by mouth 2 (two) times daily. 09/21/14  Yes TDarlin Coco MD  levothyroxine (SYNTHROID, LEVOTHROID) 88 MCG tablet Take 88 mcg by mouth daily before breakfast.   Yes Historical Provider, MD  lidocaine-prilocaine (EMLA) cream Apply 1 application topically as needed (prior to infusion at porta cath site). 09/01/14  Yes GChauncey Cruel MD  losartan (COZAAR) 50 MG tablet Take 1 tablet (50 mg total) by mouth 2 (two) times daily. 07/29/14  Yes TDarlin Coco MD  metoprolol tartrate (LOPRESSOR) 25 MG tablet Take 12.5 mg by mouth 2 (two) times daily. 1/2 twice a day   Yes Historical Provider, MD  multivitamin ([facility] per tablet Take 1 tablet by mouth daily.    Yes Historical Provider, MD  NITROSTAT 0.4 MG SL tablet   09/12/14  Yes Historical Provider, MD  Omega-3 Fatty Acids (FISH OIL) 1000 MG CAPS Take 1,000 mg by mouth 2 (two) times daily.    Yes Historical Provider, MD  potassium chloride SA (K-DUR,KLOR-CON) 20 MEQ tablet Take 20 mEq by mouth daily.   Yes Historical Provider, MD  simvastatin (ZOCOR) 20 MG tablet Take 10 mg by mouth daily.   Yes Historical Provider, MD  zolpidem (AMBIEN CR) 12.5 MG CR tablet TAKE ONE TABLET AT BEDTIME AS NEEDED Patient taking differently: TAKE ONE TABLET AT BEDTIME AS NEEDED FOR SLEEP 08/07/14  Yes TDarlin Coco MD   BP 149/71 mmHg  Pulse 94  Temp(Src) 97.9 F (36.6 C) (Oral)  Resp 18  Ht 5' (1.524 m)  Wt 124 lb (56.246 kg)  BMI 24.22 kg/m2  SpO2 100% Physical Exam  Constitutional: She is oriented to person, place, and time. She appears well-developed.  Elderly, frail  HENT:  Head: Normocephalic and atraumatic.  Right Ear: External ear normal.  Left Ear: External ear normal.  Eyes: Conjunctivae and EOM are normal. Pupils are equal, round, and reactive to light.  Neck: Normal range of motion and phonation normal. Neck supple.  Cardiovascular: Normal  rate, regular rhythm and normal heart sounds.   Pulmonary/Chest: Effort normal and breath sounds normal. She exhibits no bony tenderness.  Abdominal: Soft. There is no tenderness.  Large left lower quadrant hematoma, which appears deep, not subcutaneous, and is associated with generalized abdominal tenderness and discomfort with palpation. There is no rebound tenderness. There is increased pain at this site, with movement from supine to sitting. There is no pain, with straight leg raising.  Musculoskeletal: Normal range of motion.  Neurological: She is alert and oriented to person, place, and time. No cranial nerve deficit or sensory deficit. She exhibits normal muscle tone. Coordination normal.  Skin: Skin is warm, dry and intact.  Psychiatric: She has a normal mood and affect. Her behavior is normal. Judgment and thought content normal.  Nursing note and vitals reviewed.   ED Course  Procedures (including critical care time) Medications  sodium chloride 0.9 % bolus 500 mL (not administered)  fentaNYL (SUBLIMAZE) injection 50 mcg (not administered)  ondansetron (ZOFRAN) injection 4 mg (not administered)    Patient Vitals for the past 24 hrs:  BP Temp Temp src Pulse Resp SpO2 Height Weight  12/19/14 1011 149/71 mmHg 97.9 F (36.6 C) Oral 94 18 100 % 5' (1.524 m) 124 lb (56.246 kg)  12/19/14 1008 - - - - - 99 % - -    11:57  AM Reevaluation with update and discussion. After initial assessment and treatment, an updated evaluation reveals no relief. The pain was first 2 doses of fentanyl. Will change to different medication and observe closely. Hemoglobin substantially lower than 2 weeks ago, will require repeat, serial measurements. Blood pressure has remained stable here. Tauren Delbuono L   15:30- discussed with Dr. Earleen Newport, from an eventual radiology, he will evaluate the patient, and consider embolization, if needed.  2:36 PM-Consult complete with Hospitalist. Patient case explained and discussed. She agrees to admit patient for further evaluation and treatment. Call ended at 15:35  [place] - Abnormal; Notable for the following:    Sodium 124 (*)    Chloride 88 (*)    Glucose, Bld 179 (*)    BUN 21 (*)    Creatinine, Ser 1.05 (*)    GFR calc non Af Amer 47 (*)    GFR calc Af Amer 55 (*)    All other components within normal limits  CBC WITH DIFFERENTIAL/PLATELET - Abnormal; Notable for the following:    WBC 11.2 (*)    RBC 2.73 (*)    Hemoglobin 9.2 (*)    HCT 26.8 (*)    Neutro Abs 10.2 (*)    Lymphs Abs 0.5 (*)    All other components within normal limits  PROTIME-INR  CBC WITH DIFFERENTIAL/PLATELET   CRITICAL CARE Performed by: Daleen Bo L Total critical care time: 40 min. Critical care time was exclusive of separately billable procedures and treating other patients. Critical care was necessary to treat or prevent imminent or life-threatening deterioration. Critical care was time spent personally by me on the following activities: development of treatment plan with patient and/or surrogate as well as nursing, discussions with consultants, evaluation of patient's response to treatment, examination of patient, obtaining history from patient or surrogate, ordering and performing treatments and interventions, ordering and review of laboratory studies, ordering  and review of radiographic studies, pulse oximetry and re-evaluation of patient's condition.  Imaging Review No results found. I have personally reviewed and evaluated these images and lab results as part of my  medical decision-making.   EKG Interpretation   Date/Time:  Monday December 19 2014 10:18:04 EDT Ventricular Rate:  95 PR Interval:    QRS Duration: 105 QT Interval:  342 QTC Calculation: 430 R Axis:   11 Text Interpretation:  Accelerated junctional rhythm Probable anteroseptal  infarct, recent Abnormal T, consider ischemia, diffuse leads Lateral leads  are also involved Since last tracing rate faster Confirmed by Dastan Krider  MD,  Tishia Maestre ([id number]) on 12/19/2014 10:47:50 AM      MDM   Final diagnoses:  Rectus sheath hematoma, initial encounter  Anemia, unspecified anemia type    Rectus sheath hematoma with significant blood loss anemia. Hemodynamically stable, and pain controlled after initial treatment. She will require admission for close observation and possibly a intervention to control further bleeding.   Nursing Notes Reviewed/ Care Coordinated, and agree without changes. Applicable Imaging Reviewed.  Interpretation of Laboratory Data incorporated into ED treatment  Plan: Admit   Daleen Bo, MD 12/19/14 1537

## 2014-12-19 NOTE — ED Notes (Signed)
CT called to check on time for scan. CT reports pt has 5 people in front of her. Family updated.

## 2014-12-19 NOTE — ED Notes (Signed)
EKG completed and given to Dr Eulis Foster.

## 2014-12-19 NOTE — ED Notes (Signed)
Ct called for pt. States pt is next.

## 2014-12-19 NOTE — Telephone Encounter (Signed)
New message      Pt states she went to ER sat because of a big hematoma in her abd.  She states it is not better.  Please call

## 2014-12-19 NOTE — ED Notes (Signed)
Seen left side abdominal pain and ecchymosis seen in ED 2 days ago stated hematoma from Lovenox injections. Pain continued and currently 10/10 sharp with nausea today.

## 2014-12-19 NOTE — ED Notes (Signed)
Vicente Males, RN has physically moved patient to [facility] with family in tow; patient placed on monitor by Vicente Males, Therapist, sports

## 2014-12-19 NOTE — Progress Notes (Signed)
Chief Complaint: Patient was seen in consultation today for large left rectus sheath hematoma at the request of Dr. Eulis Foster  Referring Physician(s): Dr. Eulis Foster  History of Present Illness: Deborah [name] is a 81 y.o. female who developed LLQ pain a few days ago. She came to the ER and was found to have a LLQ hematoma. She is on chronic Lovenox for mechanical heart valve, for which she takes '85mg'$  every morning, last dose was yesterday. She was sent home but has come back today with worsening pain and feels the mass/hematoma is larger in size. She had CT  showing a large 18+ cm hematoma. Unknown if there is active bleeding as CT was non-contrasted. IR is asked to consult for consideration of angiogram/embolization. Pt resting comfortable in bed, though does report LLQ pain/discomfort. She denies dizziness, CP, N/V, weakness. PMHx, meds, labs, imaging reviewed. Daughter is at bedside.  Past Medical History  Diagnosis Date  . Hypothyroidism   . Stomach problems     seeing Dr Watt Climes  . Hypertension   . GERD (gastroesophageal reflux disease)   . Anemia   . Arrhythmia     Atrial Fibrillation  . Breast cancer     "right; S/P lumpectomy, chemo, XRT"   . Complication of anesthesia     "once I'm awakened, I don't sleep til the following night"  . High cholesterol   . Heart murmur   . History of blood transfusion 1996; ~ 2013    "related to valve replacement; GI bleeding"   . Chronic lower GI bleeding   . Arthritis     "fingers, toes, hips; qwhere" (05/19/2013)  . Gout   . Pneumonia 07/2011    Past Surgical History  Procedure Laterality Date  . Arteriogram  01/12    NO RAS   . Cardioversion      X 2  . Cardiac catheterization    . Cataract extraction w/ intraocular lens  implant, bilateral Bilateral     bilateral cataract with lens implants  . Cholecystectomy    . Abdominal hysterectomy    . Appendectomy    . Colonoscopy with propofol N/A 11/24/2012    Procedure: COLONOSCOPY  WITH PROPOFOL;  Surgeon: Jeryl Columbia, MD;  Location: [facility] ENDOSCOPY;  Service: Endoscopy;  Laterality: N/A;  . Breast biopsy Right   . Breast lumpectomy Right   . Aortic valve replacement  1996    [employer]  . Cardiac valve replacement  1996    Allergies: Amiodarone; Alendronate sodium; Macrodantin; Meclizine; Norvasc; and Tussionex pennkinetic er  Medications: Prior to Admission medications   Medication Sig Start Date End Date Taking? Authorizing Angila Wombles  acetaminophen (TYLENOL) 500 MG tablet Take 1,000 mg by mouth every 6 (six) hours as needed for headache.   Yes Historical Emilee Market, MD  albuterol (PROVENTIL HFA;VENTOLIN HFA) 108 (90 BASE) MCG/ACT inhaler Inhale 1-2 puffs into the lungs every 6 (six) hours as needed for wheezing or shortness of breath. 09/15/14  Yes Susanne Borders, NP  allopurinol (ZYLOPRIM) 300 MG tablet TAKE ONE TABLET EACH DAY 11/22/14  Yes Darlin Coco, MD  amLODipine (NORVASC) 5 MG tablet Take 1 tablet (5 mg total) by mouth 2 (two) times daily. 08/02/14  Yes Darlin Coco, MD  calcium-vitamin D 500 MG tablet Take 1 tablet by mouth 2 (two) times daily.     Yes Historical Josalin Carneiro, MD  dicyclomine (BENTYL) 10 MG capsule Take 10 mg by mouth daily as needed for spasms.    Yes  Historical Zela Sobieski, MD  [place] 125 MCG tablet TAKE ONE TABLET EACH DAY Patient taking differently: TAKE ONE TABLET EACH DAY BY MOUTH 10/31/14  Yes Darlin Coco, MD  enoxaparin (LOVENOX) 100 MG/ML injection INJECT 0.78m ('85mg'$  TOTAL) INTO SKIN DAILY 09/26/14  Yes GChauncey Cruel MD  fexofenadine ([facility]) 180 MG tablet Take 180 mg by mouth daily as needed for allergies.    Yes Historical Melvie Paglia, MD  furosemide (LASIX) 40 MG tablet Take 40 mg by mouth daily as needed (for swelling).   Yes Historical Vickey Boak, MD  hydrochlorothiazide (MICROZIDE) 12.5 MG capsule Take 1 capsule (12.5 mg total) by mouth 2 (two) times daily. 09/21/14  Yes TDarlin Coco MD  levothyroxine (SYNTHROID,  LEVOTHROID) 88 MCG tablet Take 88 mcg by mouth daily before breakfast.   Yes Historical Bryar Dahms, MD  lidocaine-prilocaine (EMLA) cream Apply 1 application topically as needed (prior to infusion at porta cath site). 09/01/14  Yes GChauncey Cruel MD  losartan (COZAAR) 50 MG tablet Take 1 tablet (50 mg total) by mouth 2 (two) times daily. 07/29/14  Yes TDarlin Coco MD  metoprolol tartrate (LOPRESSOR) 25 MG tablet Take 12.5 mg by mouth 2 (two) times daily. 1/2 twice a day   Yes Historical Diquan Kassis, MD  multivitamin ([facility] per tablet Take 1 tablet by mouth daily.    Yes Historical Keniesha Adderly, MD  NITROSTAT 0.4 MG SL tablet   09/12/14  Yes Historical Cecely Rengel, MD  Omega-3 Fatty Acids (FISH OIL) 1000 MG CAPS Take 1,000 mg by mouth 2 (two) times daily.    Yes Historical Abram Sax, MD  potassium chloride SA (K-DUR,KLOR-CON) 20 MEQ tablet Take 20 mEq by mouth daily.   Yes Historical Quinto Tippy, MD  simvastatin (ZOCOR) 20 MG tablet Take 10 mg by mouth daily.   Yes Historical Aijah Lattner, MD  zolpidem (AMBIEN CR) 12.5 MG CR tablet TAKE ONE TABLET AT BEDTIME AS NEEDED Patient taking differently: TAKE ONE TABLET AT BEDTIME AS NEEDED FOR SLEEP 08/07/14  Yes TDarlin Coco MD     Family History  Problem Relation Age of Onset  . Dementia Mother   . Coronary artery disease Father     Social History   Social History  . Marital Status: Widowed    Spouse Name: N/A  . Number of Children: N/A  . Years of Education: N/A   Social History Main Topics  . Smoking status: Former Smoker -- 1.00 packs/day for 20 years    Types: Cigarettes    Quit date: 04/01/1988  . Smokeless tobacco: Never Used  . Alcohol Use: 4.8 oz/week    4 Glasses of wine, 4 Shots of liquor per week     Comment: burbon or glass wine daily  . Drug Use: No  . Sexual Activity: No   Other Topics Concern  . None   Social History Narrative     Review of Systems: A 12 point ROS discussed and pertinent positives are indicated in the  HPI above.  All other systems are negative.  Review of Systems  Vital Signs: BP 133/57 mmHg  Pulse 94  Temp(Src) 97.9 F (36.6 C) (Oral)  Resp 14  Ht 5' (1.524 m)  Wt 124 lb (56.246 kg)  BMI 24.22 kg/m2  SpO2 98%  Physical Exam  Constitutional: She is oriented to person, place, and time. She appears well-developed and well-nourished. No distress.  HENT:  Head: Normocephalic.  Mouth/Throat: Oropharynx is clear and moist.  Neck: Normal range of motion. No tracheal deviation present.  Cardiovascular: Normal  rate, regular rhythm, normal heart sounds and intact distal pulses.   Pulmonary/Chest: Effort normal and breath sounds normal. No respiratory distress.  Abdominal: Soft. She exhibits mass. There is tenderness.  Palpable mass in LLQ with ecchymosis tracking dependently to left groin and left flank areas.  Neurological: She is alert and oriented to person, place, and time.  Psychiatric: She has a normal mood and affect. Judgment normal.    Mallampati Score:  MD Evaluation Airway: WNL Heart: WNL Abdomen: WNL Chest/ Lungs: WNL ASA  Classification: 3 Mallampati/Airway Score: One  Imaging: Ct Abdomen Pelvis Wo Contrast  12/19/2014   CLINICAL DATA:  Left abdominal pain since Saturday. Pain is getting progressively worse. Patient is on blood thinners.  EXAM: CT ABDOMEN AND PELVIS WITHOUT CONTRAST  TECHNIQUE: Multidetector CT imaging of the abdomen and pelvis was performed following the standard protocol without IV contrast.  COMPARISON:  12/31/2011 and 11/08/2014  FINDINGS: Lung bases are clear. Mild enlargement of the heart appears stable. There is no evidence for free intraperitoneal air.  There is a large heterogeneous structure involving the left rectus sheath compatible with a hematoma. This hematoma measures 18.6 x 11.0 x 15.7 cm. Active bleeding cannot be evaluated on this non contrast examination. There is subcutaneous edema and stranding in the left anterior abdomen adjacent  to the hematoma.  Again noted are innumerable renal cysts. One of the largest cysts measures up to 6.1 cm in the left kidney. There is compression on the left kidney and left abdominal structures from the large rectus sheath hematoma.  Again noted is a 1.8 cm low-density structure in left hepatic lobe which probably represents a hepatic cyst. The gallbladder has been removed. No gross abnormality to the spleen or pancreas.  Atherosclerotic calcifications in the aorta and visceral arteries without aneurysm. There is subcutaneous edema extending into the lower anterior abdominal and pelvic subcutaneous tissues. Fluid in the urinary bladder. Uterus is surgically absent. Stable nodular structures near the vaginal cuff. No acute abnormality or dilatation to the small or large bowel. Stable mild fullness in the left adrenal gland. Normal appearance of the right adrenal gland.  There is severe disc space loss at L1-L2 with approximately 6 mm of retrolisthesis at L1-L2. Vacuum disc phenomena at T12-L1. There is extensive subchondral sclerosis and endplate changes at G3-T5. Disease at L1-L2 is similar to the exam on 11/08/2014.  IMPRESSION: Large left-sided rectus sheath hematoma, measuring up to 18.6 cm.  Innumerable bilateral renal cysts.  Severe disc space disease at L1-L2.  These results were called by telephone at the time of interpretation on 12/19/2014 at 2:20 pm to Dr. Daleen Bo , who verbally acknowledged these results.   Electronically Signed   By: Markus Daft M.D.   On: 12/19/2014 14:21    Labs:  CBC:  Recent Labs  10/11/14 0952 11/17/14 1000 12/09/14 1037 12/19/14 1120  WBC 4.6 4.0 5.3 11.2*  HGB 12.7 11.9 13.0 9.2*  HCT 38.0 35.1 39.0 26.8*  PLT 175 162 171 221    COAGS:  Recent Labs  05/24/14 1300 05/30/14 1101 06/06/14 1042 06/06/14 1100 12/19/14 1120  INR 0.93 1.20* 1.40* 1.4 1.04  APTT 45*  --   --   --   --     BMP:  Recent Labs  05/13/14 1223 05/14/14 1039  05/24/14 1300 12/19/14 1120  NA 128* 132* 134* 124*  K 4.6 3.4* 4.4 4.8  CL 90* 95* 103 88*  CO2 '26 29 27 26  '$ GLUCOSE 105*  131* 98 179*  BUN 17 12 36* 21*  CALCIUM 9.5 9.0 10.3 10.0  CREATININE 0.74 0.77 0.74 1.05*  GFRNONAA 75* 74* 75* 47*  GFRAA 87* 86* 87* 55*    LIVER FUNCTION TESTS:  Recent Labs  04/19/14 1025 05/12/14 0936 05/13/14 1223  BILITOT 0.30 0.4 0.8  AST 19 22 44*  ALT 20 22 32  ALKPHOS 64 44 48  PROT 6.0* 5.2* 6.2  ALBUMIN 3.6 3.4* 3.7    Assessment and Plan: Large left rectus sheath hematoma, unkwown if active bleeding or if stopped. Pt hemodynamically stable, Hgb is down to 9.2 from a few days ago, but HR and BP stable. Will order CTA abd/pelvis to assess for active hemorrhage Recommend starting IV heparin to protect mechanical valve. Last dose Lovenox was >24hrs ago. Heparin can be held if procedure necessary. Discussed the possibility of need for angiogram with embolization of source vessel if identifiable.  Explained procedure, risks, complications, use of sedation. IR will follow, pt and daughter have a good understanding of plan.  Thank you for this interesting consult.  A copy of this report was sent to the requesting Solly Derasmo on this date.  SignedAscencion Dike 12/19/2014, 4:35 PM   I spent a total of 20 Minutes  in face to face in clinical consultation, greater than 50% of which was counseling/coordinating care for left rectus sheath hematoma

## 2014-12-19 NOTE — ED Notes (Signed)
Pt family member concerned that patient is still in pain. MD at bedside discussing plan of care and pain management with family.

## 2014-12-19 NOTE — ED Notes (Signed)
CT called again for pt.

## 2014-12-19 NOTE — Progress Notes (Signed)
COURTESY NOTE:  81 y/o [place] woman I follow for chronic iron deficiency anemia secondary to recurrent GIB in the setting of chronic anticoagulation. We replace her iron (via feraheme) whenever the ferritin drops below 100. She is transfused whenever the Hb approaches 9.0. Since she was switched to lovenox she has had much less of a GIB problem and we have broadened follow-up here to every 3 weeks. She received feraheme 12/09/2014 and 12/16/2014.  Will follow with you

## 2014-12-19 NOTE — ED Notes (Signed)
IV team at bedside 

## 2014-12-20 ENCOUNTER — Inpatient Hospital Stay (HOSPITAL_COMMUNITY): Payer: Medicare Other

## 2014-12-20 DIAGNOSIS — D649 Anemia, unspecified: Secondary | ICD-10-CM

## 2014-12-20 DIAGNOSIS — E890 Postprocedural hypothyroidism: Secondary | ICD-10-CM

## 2014-12-20 DIAGNOSIS — I4891 Unspecified atrial fibrillation: Secondary | ICD-10-CM

## 2014-12-20 DIAGNOSIS — D500 Iron deficiency anemia secondary to blood loss (chronic): Secondary | ICD-10-CM | POA: Insufficient documentation

## 2014-12-20 LAB — CBC
HCT: 22.6 % — ABNORMAL LOW (ref 36.0–46.0)
HCT: 24 % — ABNORMAL LOW (ref 36.0–46.0)
HEMATOCRIT: 23.9 % — AB (ref 36.0–46.0)
HEMATOCRIT: 22 % — AB (ref 36.0–46.0)
HEMOGLOBIN: 8.1 g/dL — AB (ref 12.0–15.0)
HEMOGLOBIN: 7.8 g/dL — AB (ref 12.0–15.0)
Hemoglobin: 7.6 g/dL — ABNORMAL LOW (ref 12.0–15.0)
Hemoglobin: 8.1 g/dL — ABNORMAL LOW (ref 12.0–15.0)
MCH: 32.7 pg (ref 26.0–34.0)
MCH: 33.3 pg (ref 26.0–34.0)
MCH: 33.9 pg (ref 26.0–34.0)
MCH: 32.9 pg (ref 26.0–34.0)
MCHC: 33.9 g/dL (ref 30.0–36.0)
MCHC: 34.5 g/dL (ref 30.0–36.0)
MCHC: 34.5 g/dL (ref 30.0–36.0)
MCHC: 33.8 g/dL (ref 30.0–36.0)
MCV: 96.4 fL (ref 78.0–100.0)
MCV: 96.6 fL (ref 78.0–100.0)
MCV: 98.2 fL (ref 78.0–100.0)
MCV: 97.6 fL (ref 78.0–100.0)
PLATELETS: 151 10*3/uL (ref 150–400)
Platelets: 180 10*3/uL (ref 150–400)
Platelets: 156 10*3/uL (ref 150–400)
Platelets: 164 10*3/uL (ref 150–400)
RBC: 2.48 MIL/uL — ABNORMAL LOW (ref 3.87–5.11)
RBC: 2.34 MIL/uL — AB (ref 3.87–5.11)
RBC: 2.24 MIL/uL — ABNORMAL LOW (ref 3.87–5.11)
RBC: 2.46 MIL/uL — ABNORMAL LOW (ref 3.87–5.11)
RDW: 15.5 % (ref 11.5–15.5)
RDW: 15.9 % — ABNORMAL HIGH (ref 11.5–15.5)
RDW: 15.9 % — AB (ref 11.5–15.5)
RDW: 15.8 % — ABNORMAL HIGH (ref 11.5–15.5)
WBC: 9.4 10*3/uL (ref 4.0–10.5)
WBC: 7.5 10*3/uL (ref 4.0–10.5)
WBC: 8.7 10*3/uL (ref 4.0–10.5)
WBC: 8.6 10*3/uL (ref 4.0–10.5)

## 2014-12-20 LAB — COMPREHENSIVE METABOLIC PANEL
ALT: 20 U/L (ref 14–54)
AST: 29 U/L (ref 15–41)
Albumin: 2.9 g/dL — ABNORMAL LOW (ref 3.5–5.0)
Alkaline Phosphatase: 36 U/L — ABNORMAL LOW (ref 38–126)
Anion gap: 6 (ref 5–15)
BUN: 25 mg/dL — AB (ref 6–20)
CHLORIDE: 90 mmol/L — AB (ref 101–111)
CO2: 27 mmol/L (ref 22–32)
Calcium: 9 mg/dL (ref 8.9–10.3)
Creatinine, Ser: 1.32 mg/dL — ABNORMAL HIGH (ref 0.44–1.00)
GFR, EST AFRICAN AMERICAN: 41 mL/min — AB (ref >60)
GFR, EST NON AFRICAN AMERICAN: 36 mL/min — AB (ref >60)
Glucose, Bld: 114 mg/dL — ABNORMAL HIGH (ref 65–99)
POTASSIUM: 4.6 mmol/L (ref 3.5–5.1)
SODIUM: 123 mmol/L — AB (ref 135–145)
Total Bilirubin: 0.3 mg/dL (ref 0.3–1.2)
Total Protein: 4.7 g/dL — ABNORMAL LOW (ref 6.5–8.1)

## 2014-12-20 LAB — SODIUM, URINE, RANDOM: SODIUM UR: 11 mmol/L

## 2014-12-20 LAB — PREPARE RBC (CROSSMATCH)

## 2014-12-20 LAB — OSMOLALITY, URINE
Osmolality, Ur: 456 mOsm/kg (ref 390–1090)
Osmolality, Ur: 351 mOsm/kg — ABNORMAL LOW (ref 390–1090)

## 2014-12-20 LAB — FERRITIN: FERRITIN: 533 ng/mL — AB (ref 11–307)

## 2014-12-20 MED ORDER — METOPROLOL TARTRATE 12.5 MG HALF TABLET
12.5000 mg | ORAL_TABLET | Freq: Every day | ORAL | Status: DC
  Administered 2014-12-21 – 2014-12-22 (×2): 12.5 mg via ORAL
  Filled 2014-12-20 (×2): qty 1

## 2014-12-20 MED ORDER — HEPARIN SOD (PORK) LOCK FLUSH 100 UNIT/ML IV SOLN: 250.0000 [IU] | INTRAVENOUS | Status: DC | PRN

## 2014-12-20 MED ORDER — SODIUM CHLORIDE 0.9 % IJ SOLN
10.0000 mL | INTRAMUSCULAR | Status: AC | PRN
  Administered 2014-12-22: 10 mL

## 2014-12-20 MED ORDER — HEPARIN SOD (PORK) LOCK FLUSH 100 UNIT/ML IV SOLN
500.0000 [IU] | Freq: Every day | INTRAVENOUS | Status: AC | PRN
  Administered 2014-12-22: 500 [IU]

## 2014-12-20 MED ORDER — DIPHENHYDRAMINE HCL 25 MG PO CAPS
25.0000 mg | ORAL_CAPSULE | Freq: Once | ORAL | Status: AC
  Administered 2014-12-20: 25 mg via ORAL
  Filled 2014-12-20: qty 1

## 2014-12-20 MED ORDER — IOHEXOL 350 MG/ML SOLN
100.0000 mL | Freq: Once | INTRAVENOUS | Status: AC | PRN
  Administered 2014-12-20: 100 mL via INTRAVENOUS

## 2014-12-20 MED ORDER — SODIUM CHLORIDE 0.9 % IJ SOLN: 3.0000 mL | INTRAMUSCULAR | Status: DC | PRN

## 2014-12-20 MED ORDER — SODIUM CHLORIDE 0.9 % IV SOLN
250.0000 mL | Freq: Once | INTRAVENOUS | Status: AC
  Administered 2014-12-20: 250 mL via INTRAVENOUS

## 2014-12-20 NOTE — H&P (Deleted)
DOMINICK ZERTUCHE KXF:[id number] DOB: Mar 04, 1927 DOA: 12/19/2014 PCP: [name], MD  Brief narrative: 81 y/o ? Last admit 2.12-2.14 for AGIB with recurrent admissions for same problem Afib chad2vasc2>5 on coumadin Hypothyroid Mech AoV replaced 1996-previously on coumadin until February 2015 and then transition to Lovenox 2/2 multiple GI bleeds which were less on Lovenox Nl Nuclear stress 2012 Recurrent GIB-followed by Dr. Jana Hakim of Oncology/Dr. Watt Climes of GI for Chr Anemia and Fe Def Htn  Admitted 12/19/14 c Severe LLQ pain ~ 2 days PTA and on arrival to ED found to have 18.6 x 15.7 cm Recuts sheath hematoma, Chr Hyponatremia  Past medical history-As per Problem list Chart reviewed as below-   Consultants:  Cardiology  Hematology  Procedures:  CT scans  Antibiotics:  None   Subjective  Nursing reports low grade temps Tolerating diet No fever No nausea no vomiting No chest pain Pain in the back is more bearable although was pretty severe earlier    Objective    Interim History:   Telemetry: Sinus rhythm   Objective: Filed Vitals:   12/20/14 0400 12/20/14 0600 12/20/14 0700 12/20/14 0726  BP: 123/29 151/14 137/57   Pulse: 47 65 66   Temp:    99.4 F (37.4 C)  TempSrc:    Oral  Resp: '20 20 19   '$ Height:      Weight:      SpO2: 94% 94% 93%     Intake/Output Summary (Last 24 hours) at 12/20/14 0736 Last data filed at 12/20/14 0600  Gross per 24 hour  Intake 1167.5 ml  Output    310 ml  Net  857.5 ml    Exam:  General:  EOMI NCAT alert oriented pleasant Cardiovascular:  S1-S2 no murmur rub or gallop Respiratory:  Clinically clear no added sound Abdomen:  Extensive hematoma across the back and tenderness to the flank on the left side SS1-S2 see above discussion Neuro intact   Data Reviewed: Basic Metabolic Panel:  Recent Labs Lab 12/19/14 1120 12/20/14 0300  NA 124* 123*  K 4.8 4.6  CL 88* 90*  CO2 26 27  GLUCOSE 179* 114*    BUN 21* 25*  CREATININE 1.05* 1.32*  CALCIUM 10.0 9.0   Liver Function Tests:  Recent Labs Lab 12/20/14 0300  AST 29  ALT 20  ALKPHOS 36*  BILITOT 0.3  PROT 4.7*  ALBUMIN 2.9*   No results for input(s): LIPASE, AMYLASE in the last 168 hours. No results for input(s): AMMONIA in the last 168 hours. CBC:  Recent Labs Lab 12/19/14 1120 12/19/14 1840 12/20/14 0300  WBC 11.2* 9.8 9.4  NEUTROABS 10.2*  --   --   HGB 9.2* 7.6* 8.1*  HCT 26.8* 21.8* 23.9*  MCV 98.2 99.1 96.4  PLT 221 196 180   Cardiac Enzymes:  Recent Labs Lab 12/19/14 1840  TROPONINI 0.03   BNP: Invalid input(s): POCBNP CBG: No results for input(s): GLUCAP in the last 168 hours.  Recent Results (from the past 240 hour(s))  MRSA PCR Screening     Status: None   Collection Time: 12/19/14  6:37 PM  Result Value Ref Range Status   MRSA by PCR NEGATIVE NEGATIVE Final    Comment:        The GeneXpert MRSA Assay (FDA approved for NASAL specimens only), is one component of a comprehensive MRSA colonization surveillance program. It is not intended to diagnose MRSA infection nor to guide or monitor treatment for MRSA infections.  Studies:              All Imaging reviewed and is as per above notation   Scheduled Meds: . digoxin  0.125 mg Oral Daily  . levothyroxine  88 mcg Oral QAC breakfast  . loratadine  10 mg Oral Daily  . losartan  50 mg Oral BID  . metoprolol tartrate  12.5 mg Oral BID  . multivitamin with minerals  1 tablet Oral Daily  . omega-3 acid ethyl esters  1,000 mg Oral BID  . simvastatin  10 mg Oral Daily  . sodium chloride  3 mL Intravenous Q12H   Continuous Infusions: . sodium chloride 75 mL/hr at 12/19/14 2327     Assessment/Plan: 1. Large hematoma in flank secondary to therapeutic Lovenox for mechanical heart valve-appreciate both cardiology and oncology opinion. Further determination as to best way forward with regards to high bleed risk as well as need for  decortication defer to them--sounds like she may be reconsidering Coumadin which may be feasible 2. Acute hypovlemichyponatremia -probably secondary to T toast Potomania. Would order urine sodium and osmolality.  Serum osmole was 265 indicating hypovolemia 3. Volume depletion and acute kidney injury-probably secondary to loss of blood as well as poor by mouth intake. Continue saline at 75 cc an hour 4. Atrial fibrillation [place] score above 5-see above discussion. Continue metoprolol 12.5 twice a day. Continue digoxin 0.125 daily. Not a candidate at present time for anticoagulation 5. Recurrent GI bleeds-not currently a clinical issue however hemoglobin is low 6. Anemia of acute blood loss-secondary to hematoma. Would not transfuse as no cardiac or coronary disease other than rhythm issues.  If drops below 7 would transfuse according to guidelines  7. moderate controlled hypertension-continue losartan 50 twice a day [unusual dose?] Continue other meds as above 8. Hyperlipidemia-continue Zocor 10 daily.  May benefit from more selective statin as this may have some interactions with her meds 9. Mild thrombocytopenia-monitor  Code Status: Full Family Communication:  None at bedside Disposition Plan: inpatient on SDU for today likely transfer out in am  Family Communication: family +   Verneita Griffes, MD  [facility] Pager [phone] 12/20/2014, 7:36 AM    LOS: 1 day

## 2014-12-20 NOTE — Progress Notes (Signed)
Subjective: Follow up LLQ rectus sheath hematoma Feels better today, much less discomfort Received PRBCs yesterday Had CTA this am Imaging reviewed with Dr. Earleen Newport and Dr. Annamaria Boots  Allergies: Amiodarone; Alendronate sodium; Macrodantin; Meclizine; Norvasc; and Tussionex pennkinetic er  Medications:  Current facility-administered medications:  .  0.9 %  sodium chloride infusion, , Intravenous, Continuous, Melton Alar, PA-C, Last Rate: 75 mL/hr at 12/19/14 2327 .  acetaminophen (TYLENOL) tablet 1,000 mg, 1,000 mg, Oral, Q6H PRN, Melton Alar, PA-C, 1,000 mg at 12/20/14 0807 .  albuterol (PROVENTIL) (2.5 MG/3ML) 0.083% nebulizer solution 2.5 mg, 2.5 mg, Nebulization, Q6H PRN, Hosie Poisson, MD .  ALPRAZolam Duanne Moron) tablet 0.25 mg, 0.25 mg, Oral, BID PRN, Melton Alar, PA-C .  alum & mag hydroxide-simeth (MAALOX/MYLANTA) 200-200-20 MG/5ML suspension 30 mL, 30 mL, Oral, Q6H PRN, Melton Alar, PA-C .  dicyclomine (BENTYL) capsule 10 mg, 10 mg, Oral, Daily PRN, Melton Alar, PA-C .  digoxin (LANOXIN) tablet 0.125 mg, 0.125 mg, Oral, Daily, Melton Alar, PA-C, 0.125 mg at 12/20/14 0914 .  HYDROmorphone (DILAUDID) injection 0.5-1 mg, 0.5-1 mg, Intravenous, Q4H PRN, Melton Alar, PA-C, 1 mg at 12/19/14 1603 .  levothyroxine (SYNTHROID, LEVOTHROID) tablet 88 mcg, 88 mcg, Oral, QAC breakfast, Melton Alar, PA-C, 88 mcg at 12/20/14 4656 .  lidocaine-prilocaine (EMLA) cream 1 application, 1 application, Topical, PRN, Melton Alar, PA-C .  loratadine (CLARITIN) tablet 10 mg, 10 mg, Oral, Daily, Melton Alar, PA-C, 10 mg at 12/20/14 8127 .  LORazepam (ATIVAN) injection 1 mg, 1 mg, Intravenous, Once PRN, Melton Alar, PA-C .  losartan (COZAAR) tablet 50 mg, 50 mg, Oral, BID, Melton Alar, PA-C, 50 mg at 12/20/14 0914 .  metoprolol tartrate (LOPRESSOR) tablet 12.5 mg, 12.5 mg, Oral, BID, Melton Alar, PA-C, 12.5 mg at 12/20/14 0914 .  multivitamin with minerals  tablet 1 tablet, 1 tablet, Oral, Daily, Melton Alar, PA-C, 1 tablet at 12/20/14 5170 .  nitroGLYCERIN (NITROSTAT) SL tablet 0.4 mg, 0.4 mg, Sublingual, Q5 min PRN, Melton Alar, PA-C .  omega-3 acid ethyl esters (LOVAZA) capsule 1,000 mg, 1,000 mg, Oral, BID, Bobby Rumpf [place], PA-C, 1,000 mg at 12/20/14 0174 .  ondansetron (ZOFRAN) tablet 4 mg, 4 mg, Oral, Q6H PRN **OR** ondansetron (ZOFRAN) injection 4 mg, 4 mg, Intravenous, Q6H PRN, Melton Alar, PA-C .  simvastatin (ZOCOR) tablet 10 mg, 10 mg, Oral, Daily, Melton Alar, PA-C, 10 mg at 12/20/14 0915 .  zolpidem (AMBIEN) tablet 5 mg, 5 mg, Oral, QHS PRN, Melton Alar, PA-C    Vital Signs: BP 139/52 mmHg  Pulse 77  Temp(Src) 99.4 F (37.4 C) (Oral)  Resp 19  Ht '5\' 1"'$  (1.549 m)  Wt 130 lb 11.7 oz (59.3 kg)  BMI 24.71 kg/m2  SpO2 88%  Physical Exam  Constitutional: She is oriented to person, place, and time. She appears well-developed. No distress.  Cardiovascular: Normal rate, regular rhythm and normal heart sounds.   Pulmonary/Chest: Effort normal and breath sounds normal. No respiratory distress.  Abdominal: Soft.  LLQ abd mass/hematoma remains, no larger than yesterday, mildly tender  Neurological: She is alert and oriented to person, place, and time.    Imaging: Ct Abdomen Pelvis Wo Contrast  12/19/2014   CLINICAL DATA:  Left abdominal pain since Saturday. Pain is getting progressively worse. Patient is on blood thinners.  EXAM: CT ABDOMEN AND PELVIS WITHOUT CONTRAST  TECHNIQUE: Multidetector CT imaging of the  abdomen and pelvis was performed following the standard protocol without IV contrast.  COMPARISON:  12/31/2011 and 11/08/2014  FINDINGS: Lung bases are clear. Mild enlargement of the heart appears stable. There is no evidence for free intraperitoneal air.  There is a large heterogeneous structure involving the left rectus sheath compatible with a hematoma. This hematoma measures 18.6 x 11.0 x 15.7 cm. Active  bleeding cannot be evaluated on this non contrast examination. There is subcutaneous edema and stranding in the left anterior abdomen adjacent to the hematoma.  Again noted are innumerable renal cysts. One of the largest cysts measures up to 6.1 cm in the left kidney. There is compression on the left kidney and left abdominal structures from the large rectus sheath hematoma.  Again noted is a 1.8 cm low-density structure in left hepatic lobe which probably represents a hepatic cyst. The gallbladder has been removed. No gross abnormality to the spleen or pancreas.  Atherosclerotic calcifications in the aorta and visceral arteries without aneurysm. There is subcutaneous edema extending into the lower anterior abdominal and pelvic subcutaneous tissues. Fluid in the urinary bladder. Uterus is surgically absent. Stable nodular structures near the vaginal cuff. No acute abnormality or dilatation to the small or large bowel. Stable mild fullness in the left adrenal gland. Normal appearance of the right adrenal gland.  There is severe disc space loss at L1-L2 with approximately 6 mm of retrolisthesis at L1-L2. Vacuum disc phenomena at T12-L1. There is extensive subchondral sclerosis and endplate changes at Z5-G3. Disease at L1-L2 is similar to the exam on 11/08/2014.  IMPRESSION: Large left-sided rectus sheath hematoma, measuring up to 18.6 cm.  Innumerable bilateral renal cysts.  Severe disc space disease at L1-L2.  These results were called by telephone at the time of interpretation on 12/19/2014 at 2:20 pm to Dr. Daleen Bo , who verbally acknowledged these results.   Electronically Signed   By: Markus Daft M.D.   On: 12/19/2014 14:21    Labs:  CBC:  Recent Labs  12/19/14 1120 12/19/14 1840 12/20/14 0300 12/20/14 0930  WBC 11.2* 9.8 9.4 7.5  HGB 9.2* 7.6* 8.1* 7.8*  HCT 26.8* 21.8* 23.9* 22.6*  PLT 221 196 180 156    COAGS:  Recent Labs  05/24/14 1300 05/30/14 1101 06/06/14 1042 06/06/14 1100  12/19/14 1120  INR 0.93 1.20* 1.40* 1.4 1.04  APTT 45*  --   --   --   --     BMP:  Recent Labs  05/14/14 1039 05/24/14 1300 12/19/14 1120 12/20/14 0300  NA 132* 134* 124* 123*  K 3.4* 4.4 4.8 4.6  CL 95* 103 88* 90*  CO2 '29 27 26 27  '$ GLUCOSE 131* 98 179* 114*  BUN 12 36* 21* 25*  CALCIUM 9.0 10.3 10.0 9.0  CREATININE 0.77 0.74 1.05* 1.32*  GFRNONAA 74* 75* 47* 36*  GFRAA 86* 87* 55* 41*    LIVER FUNCTION TESTS:  Recent Labs  04/19/14 1025 05/12/14 0936 05/13/14 1223 12/20/14 0300  BILITOT 0.30 0.4 0.8 0.3  AST 19 22 44* 29  ALT 20 22 32 20  ALKPHOS 64 44 48 36*  PROT 6.0* 5.2* 6.2 4.7*  ALBUMIN 3.6 3.4* 3.7 2.9*    Assessment and Plan: Left rectus sheath hematoma CTA shows no signs of active extravasation/hemorrhage. Hematoma no larger Hemodynamically stable Hgb still trend down, likely equilibrating Do not feel angiogram/embo necessary at this point. IR will sign off. Can advance diet Other plans(anticoagulation)per Cardiology/Hematology   Signed: Ascencion Dike  12/20/2014, 10:45 AM   I spent a total of 15 Minutes at the the patient's bedside AND on the patient's hospital floor or unit, greater than 50% of which was counseling/coordinating care for left rectus sheath hematoma

## 2014-12-20 NOTE — Progress Notes (Signed)
Deborah [name] is alert and fairly pain free. She is feeling somewhat SOB at rest and HB has been trending down   Results for Deborah [name], Deborah [name] (MRN [id number]) as of 12/20/2014 21:12  Ref. Range 12/19/2014 11:20 12/19/2014 18:40 12/20/2014 03:00 12/20/2014 09:30 12/20/2014 15:00  Hemoglobin Latest Ref Range: 12.0-15.0 g/dL 9.2 (L) 7.6 (L) 8.1 (L) 7.8 (L) 7.6 (L)   I will go ahead and set up for 2 units this evening. Hopefully she will turn around after this and we can start to think of discharge.  Choice is going to be continuing lovenox at a lower dose or going back to warfarin. Given her multiple bleeding problems with both, I would vote for warfarin with a fairly tight INR-- 2.0- 2.5 but will be interested in Dr Sherryl Barters opinion.  Greatly appreciate hospitalist's care of this patient!

## 2014-12-20 NOTE — Progress Notes (Signed)
Received call from lab stating the CBC drawn and sent down at 0030 was never received. This CBC was listed as "In Process" since approximately 0100 but lab employees stated they never saw it. Will redraw and send. Johnsie Cancel, RN

## 2014-12-20 NOTE — Care Management Note (Signed)
Case Management Note  Patient Details  Name: Deborah [name] MRN: [id number] Date of Birth: 10-22-1926  Subjective/Objective:        Adm w rectus sheat hematoma            Action/Plan: lives indep at [facility]   Expected Discharge Date:                  Expected Discharge Plan:  [place]  In-House Referral:     Discharge planning Services     Post Acute Care Choice:    Choice offered to:     DME Arranged:    DME Agency:     HH Arranged:    [place] Agency:     Status of Service:     Medicare Important Message Given:    Date Medicare IM Given:    Medicare IM give by:    Date Additional Medicare IM Given:    Additional Medicare Important Message give by:     If discussed at [place] of Stay Meetings, dates discussed:    Additional Comments: ur review done  Lacretia Leigh, RN 12/20/2014, 7:41 AM

## 2014-12-20 NOTE — Progress Notes (Signed)
    Subjective:  The pain is improved. Still tender and left flank area. Otherwise no complaints.  Objective:  Vital Signs in the last 24 hours: Temp:  [98.4 F (36.9 C)-99.4 F (37.4 C)] 99.4 F (37.4 C) (09/20 0726) Pulse Rate:  [43-103] 77 (09/20 1000) Resp:  [14-28] 19 (09/20 0700) BP: (101-155)/(14-87) 139/52 mmHg (09/20 0910) SpO2:  [87 %-100 %] 88 % (09/20 1000) Weight:  [124 lb 9 oz (56.5 kg)-130 lb 11.7 oz (59.3 kg)] 130 lb 11.7 oz (59.3 kg) (09/20 0300)  Intake/Output from previous day: 09/19 0701 - 09/20 0700 In: 1242.5 [I.V.:907.5; Blood:335] Out: 32 [Urine:310]  Physical Exam: Pt is alert and oriented, NAD HEENT: normal Neck: JVP - normal Lungs: CTA bilaterally CV: RRR with grade 2/6 systolic ejection murmur at the right upper sternal border with crisp mechanical A2, no diastolic murmur Abd: there is extensive ecchymoses around the left lower abdominal wall extending to the left flank with associated tenderness Ext: no C/C/E, distal pulses intact and equal Skin: warm/dry no rash   Lab Results:  Recent Labs  12/20/14 0300 12/20/14 0930  WBC 9.4 7.5  HGB 8.1* 7.8*  PLT 180 156    Recent Labs  12/19/14 1120 12/20/14 0300  NA 124* 123*  K 4.8 4.6  CL 88* 90*  CO2 26 27  GLUCOSE 179* 114*  BUN 21* 25*  CREATININE 1.05* 1.32*    Recent Labs  12/19/14 1840  TROPONINI 0.03   Assessment/Plan:  1. Acute blood loss anemia secondary to rectus sheath hematoma 2. Mechanical aortic valve replacement a proximally 20 years ago with a [employer] prosthesis 3. Paroxysmal atrial fibrillation  Reviewed note of Dr. Debara Pickett and agree with the plan as he outlined. The patient does have extensive ecchymoses and has had a large rectus sheath hematoma. As long as Hemoglobin is stable, would favor one week off of anticoagulation, then start warfarin with close follow-up in the anticoagulation clinic. Would not use heparin or lovenox.  Sherren Mocha,  M.D. 12/20/2014, 10:43 AM

## 2014-12-20 NOTE — Evaluation (Signed)
Physical Therapy Evaluation/ Discharge Patient Details Name: EVVA DIN MRN: [id number] DOB: [birth year] Today's Date: 12/20/2014   History of Present Illness  81 year old lady with h/o Mechanical AV on anticoagulation with warfarin until feb, switched to lovenox injections, anemia, hypothyroidism, presents with abdominal pain, and extensive bruising of the lower abdomen and to the groin. A CT abdomen and pelvis showed rectal sheath hematoma  Clinical Impression  Pt very pleasant up in chair on arrival.Pt has been in independent living at [facility] for 10 years and is very independent. Pt with acute abdominal pain secondary to hematoma but with cues able to perform sit to and from side to decrease pain as well as all transfers and gait. Pt stood to wash face, brush hair and teeth without difficulty and performs her own ADLs at baseline. Pt currently without therapy needs and is encouraged to continue daily walking with nursing supervision for lines only. Pt aware and agreeable to above. Will sign off.     Follow Up Recommendations No PT follow up    Equipment Recommendations  None recommended by PT    Recommendations for Other Services       Precautions / Restrictions Precautions Precautions: None      Mobility  Bed Mobility Overal bed mobility: Modified Independent                Transfers Overall transfer level: Modified independent                  Ambulation/Gait Ambulation/Gait assistance: Independent Ambulation Distance (Feet): 450 Feet Assistive device: None Gait Pattern/deviations: WFL(Within Functional Limits)   Gait velocity interpretation: at or above normal speed for age/gender    Stairs            Wheelchair Mobility    Modified Rankin (Stroke Patients Only)       Balance Overall balance assessment: No apparent balance deficits (not formally assessed)                                           Pertinent  Vitals/Pain Pain Assessment: 0-10 Pain Score: 4  Pain Location: adomen  Pain Descriptors / Indicators: Tightness;Sore  HR 92-98 BP 137/57    Home Living Family/patient expects to be discharged to:: Private residence Living Arrangements: Alone   Type of Home: Independent living facility Home Access: Level entry     Home Layout: One level Home Equipment: None      Prior Function Level of Independence: Independent               Hand Dominance        Extremity/Trunk Assessment   Upper Extremity Assessment: Overall WFL for tasks assessed           Lower Extremity Assessment: Overall WFL for tasks assessed      Cervical / Trunk Assessment: Normal  Communication   Communication: No difficulties  Cognition Arousal/Alertness: Awake/alert Behavior During Therapy: WFL for tasks assessed/performed Overall Cognitive Status: Within Functional Limits for tasks assessed                      General Comments      Exercises        Assessment/Plan    PT Assessment Patent does not need any further PT services  PT Diagnosis Acute pain   PT Problem List  PT Treatment Interventions     PT Goals (Current goals can be found in the Care Plan section) Acute Rehab PT Goals Patient Stated Goal: return to [facility] PT Goal Formulation: All assessment and education complete, DC therapy    Frequency     Barriers to discharge        Co-evaluation               End of Session   Activity Tolerance: Patient tolerated treatment well Patient left: in chair;with call bell/phone within reach Nurse Communication: Mobility status         Time: 0747-0800 PT Time Calculation (min) (ACUTE ONLY): 13 min   Charges:   PT Evaluation $Initial PT Evaluation Tier I: 1 Procedure     PT G CodesMelford Aase 12/20/2014, 8:33 AM [place], [place]

## 2014-12-20 NOTE — Progress Notes (Signed)
OT Cancellation Note and Discharge  Patient Details Name: Deborah [name] MRN: [id number] DOB: December 01, 1926   Cancelled Treatment:    Reason Eval/Treat Not Completed: OT screened, no needs identified, will sign off. Pt ambulated 450 feet with PT at an independent level and performed all grooming activities at an independent level with PT.   Almon Register [phone] 12/20/2014, 9:01 AM

## 2014-12-20 NOTE — Telephone Encounter (Signed)
Called daughter to follow up with patient as requsted Patient still admitted

## 2014-12-20 NOTE — Progress Notes (Deleted)
wrong pt  Jonetta Speak , PA-C 9:55 AM 12/20/2014

## 2014-12-20 NOTE — Progress Notes (Signed)
Deborah [name] YHC:[id number] DOB: [birth year] DOA: 12/19/2014 PCP: [name], MD  Brief narrative: 81 y/o ? Last admit 2.12-2.14 for AGIB with recurrent admissions for same problem Afib chad2vasc2>5 on coumadin Hypothyroid Mech AoV replaced 1996-previously on coumadin until February 2015 and then transition to Lovenox 2/2 multiple GI bleeds which were less on Lovenox Nl Nuclear stress 2012 Recurrent GIB-followed by Dr. Jana Hakim of Oncology/Dr. Watt Climes of GI for Chr Anemia and Fe Def Htn  Admitted 12/19/14 c Severe LLQ pain ~ 2 days PTA and on arrival to ED found to have 18.6 x 15.7 cm Recuts sheath hematoma, Chr Hyponatremia  Past medical history-As per Problem list Chart reviewed as below-   Consultants:  Cardiology  Hematology  Procedures:  CT scans  Antibiotics:  None   Subjective  Nursing reports low grade temps Tolerating diet No fever No nausea no vomiting No chest pain Pain in the back is more bearable although was pretty severe earlier    Objective    Interim History:   Telemetry: Sinus rhythm   Objective: Filed Vitals:   12/20/14 0914 12/20/14 1000 12/20/14 1111 12/20/14 1200  BP:   120/73 105/89  Pulse: 87 77 46 57  Temp:   97.7 F (36.5 C)   TempSrc:   Oral   Resp:   18   Height:      Weight:      SpO2:  88% 100% 99%    Intake/Output Summary (Last 24 hours) at 12/20/14 1326 Last data filed at 12/20/14 1222  Gross per 24 hour  Intake 2217.5 ml  Output    310 ml  Net 1907.5 ml    Exam:  General:  EOMI NCAT alert oriented pleasant Cardiovascular:  S1-S2 no murmur rub or gallop Respiratory:  Clinically clear no added sound Abdomen:  Extensive hematoma across the back and tenderness to the flank on the left side SS1-S2 see above discussion Neuro intact   Data Reviewed: Basic Metabolic Panel:  Recent Labs Lab 12/19/14 1120 12/20/14 0300  NA 124* 123*  K 4.8 4.6  CL 88* 90*  CO2 26 27  GLUCOSE 179* 114*  BUN  21* 25*  CREATININE 1.05* 1.32*  CALCIUM 10.0 9.0   Liver Function Tests:  Recent Labs Lab 12/20/14 0300  AST 29  ALT 20  ALKPHOS 36*  BILITOT 0.3  PROT 4.7*  ALBUMIN 2.9*   No results for input(s): LIPASE, AMYLASE in the last 168 hours. No results for input(s): AMMONIA in the last 168 hours. CBC:  Recent Labs Lab 12/19/14 1120 12/19/14 1840 12/20/14 0300 12/20/14 0930  WBC 11.2* 9.8 9.4 7.5  NEUTROABS 10.2*  --   --   --   HGB 9.2* 7.6* 8.1* 7.8*  HCT 26.8* 21.8* 23.9* 22.6*  MCV 98.2 99.1 96.4 96.6  PLT 221 196 180 156   Cardiac Enzymes:  Recent Labs Lab 12/19/14 1840  TROPONINI 0.03   BNP: Invalid input(s): POCBNP CBG: No results for input(s): GLUCAP in the last 168 hours.  Recent Results (from the past 240 hour(s))  MRSA PCR Screening     Status: None   Collection Time: 12/19/14  6:37 PM  Result Value Ref Range Status   MRSA by PCR NEGATIVE NEGATIVE Final    Comment:        The GeneXpert MRSA Assay (FDA approved for NASAL specimens only), is one component of a comprehensive MRSA colonization surveillance program. It is not intended to diagnose MRSA infection nor to guide or  monitor treatment for MRSA infections.      Studies:              All Imaging reviewed and is as per above notation   Scheduled Meds: . digoxin  0.125 mg Oral Daily  . levothyroxine  88 mcg Oral QAC breakfast  . loratadine  10 mg Oral Daily  . losartan  50 mg Oral BID  . [START ON 12/21/2014] metoprolol tartrate  12.5 mg Oral Daily  . multivitamin with minerals  1 tablet Oral Daily  . omega-3 acid ethyl esters  1,000 mg Oral BID  . simvastatin  10 mg Oral Daily   Continuous Infusions: . sodium chloride 75 mL/hr at 12/19/14 2327     Assessment/Plan: 1. Large hematoma in flank secondary to therapeutic Lovenox for mechanical heart valve-appreciate both cardiology and oncology opinion. Further determination as to best way forward with regards to high bleed risk as  well as need for decortication defer to them--sounds like she may be reconsidering Coumadin which may be feasible 2. Acute hypovlemichyponatremia -probably secondary to T toast Potomania. Would order urine sodium and osmolality.  Serum osmole was 265 indicating hypovolemia 3. Volume depletion and acute kidney injury-probably secondary to loss of blood as well as poor by mouth intake. Continue saline at 75 cc an hour 4. Atrial fibrillation [place] score above 5-see above discussion. Continue metoprolol 12.5 twice a day. Continue digoxin 0.125 daily. Not a candidate at present time for anticoagulation 5. Recurrent GI bleeds-not currently a clinical issue however hemoglobin is low 6. Anemia of acute blood loss-secondary to hematoma. Would not transfuse as no cardiac or coronary disease other than rhythm issues.  If drops below 7 would transfuse according to guidelines  7. moderate controlled hypertension-continue losartan 50 twice a day [unusual dose?] Continue other meds as above 8. Hyperlipidemia-continue Zocor 10 daily.  May benefit from more selective statin as this may have some interactions with her meds 9. Mild thrombocytopenia-monitor  Code Status: Full Family Communication:  None at bedside Disposition Plan: inpatient on SDU for today likely transfer out in am  Family Communication: family +   Verneita Griffes, MD  [facility] Pager [phone] 12/20/2014, 1:26 PM    LOS: 1 day

## 2014-12-21 DIAGNOSIS — D62 Acute posthemorrhagic anemia: Secondary | ICD-10-CM

## 2014-12-21 DIAGNOSIS — Z7901 Long term (current) use of anticoagulants: Secondary | ICD-10-CM

## 2014-12-21 DIAGNOSIS — I481 Persistent atrial fibrillation: Secondary | ICD-10-CM

## 2014-12-21 DIAGNOSIS — E871 Hypo-osmolality and hyponatremia: Secondary | ICD-10-CM

## 2014-12-21 DIAGNOSIS — S301XXA Contusion of abdominal wall, initial encounter: Principal | ICD-10-CM

## 2014-12-21 DIAGNOSIS — E038 Other specified hypothyroidism: Secondary | ICD-10-CM

## 2014-12-21 DIAGNOSIS — S301XXD Contusion of abdominal wall, subsequent encounter: Secondary | ICD-10-CM

## 2014-12-21 DIAGNOSIS — Z954 Presence of other heart-valve replacement: Secondary | ICD-10-CM

## 2014-12-21 LAB — BASIC METABOLIC PANEL
Anion gap: 4 — ABNORMAL LOW (ref 5–15)
Anion gap: 5 (ref 5–15)
BUN: 16 mg/dL (ref 6–20)
BUN: 15 mg/dL (ref 6–20)
CHLORIDE: 97 mmol/L — AB (ref 101–111)
CHLORIDE: 97 mmol/L — AB (ref 101–111)
CO2: 28 mmol/L (ref 22–32)
CO2: 28 mmol/L (ref 22–32)
CREATININE: 0.78 mg/dL (ref 0.44–1.00)
Calcium: 8.9 mg/dL (ref 8.9–10.3)
Calcium: 8.9 mg/dL (ref 8.9–10.3)
Creatinine, Ser: 0.86 mg/dL (ref 0.44–1.00)
GFR calc Af Amer: >60 mL/min (ref >60)
GFR calc non Af Amer: >60 mL/min (ref >60)
GFR calc non Af Amer: 60 mL/min — ABNORMAL LOW (ref >60)
Glucose, Bld: 112 mg/dL — ABNORMAL HIGH (ref 65–99)
Glucose, Bld: 90 mg/dL (ref 65–99)
POTASSIUM: 4.3 mmol/L (ref 3.5–5.1)
Potassium: 3.9 mmol/L (ref 3.5–5.1)
SODIUM: 130 mmol/L — AB (ref 135–145)
Sodium: 129 mmol/L — ABNORMAL LOW (ref 135–145)

## 2014-12-21 LAB — HEMOGLOBIN AND HEMATOCRIT, BLOOD
HCT: 29 % — ABNORMAL LOW (ref 36.0–46.0)
Hemoglobin: 9.9 g/dL — ABNORMAL LOW (ref 12.0–15.0)

## 2014-12-21 LAB — CBC
HCT: 28 % — ABNORMAL LOW (ref 36.0–46.0)
Hemoglobin: 9.6 g/dL — ABNORMAL LOW (ref 12.0–15.0)
MCH: 31.5 pg (ref 26.0–34.0)
MCHC: 34.3 g/dL (ref 30.0–36.0)
MCV: 91.8 fL (ref 78.0–100.0)
PLATELETS: 146 10*3/uL — AB (ref 150–400)
RBC: 3.05 MIL/uL — AB (ref 3.87–5.11)
RDW: 19 % — ABNORMAL HIGH (ref 11.5–15.5)
WBC: 6.8 10*3/uL (ref 4.0–10.5)

## 2014-12-21 MED ORDER — SODIUM CHLORIDE 0.9 % IV SOLN
INTRAVENOUS | Status: DC
  Administered 2014-12-21: 23:00:00 via INTRAVENOUS

## 2014-12-21 MED ORDER — SODIUM CHLORIDE 0.9 % IJ SOLN
10.0000 mL | INTRAMUSCULAR | Status: DC | PRN
Start: 2014-12-21 — End: 2014-12-22

## 2014-12-21 MED ORDER — HYDROXYZINE HCL 25 MG PO TABS
25.0000 mg | ORAL_TABLET | Freq: Three times a day (TID) | ORAL | Status: DC | PRN
  Administered 2014-12-21 – 2014-12-22 (×2): 25 mg via ORAL
  Filled 2014-12-21 (×2): qty 1

## 2014-12-21 NOTE — Progress Notes (Signed)
[facility] Progress Note   Deborah [name]  GGE:[id number]  DOB: 30-May-1926  DOA: 12/19/2014 PCP: [name], MD  Brief narrative: Deborah [name] is a 81 y.o. female with a history of anemia, hypothyroidism, mechanical aortic valve and GI bleeds on Coumadin. The patient has been on Lovenox since February of this year for atrial fibrillation. She presents for abdominal pain and found to have a large left-sided rectal sheath hematoma. Due to acute drop in hemoglobin as a result of this hematoma, she has received 3 units of packed red blood cells   Subjective: Complaints of pain, nausea, vomiting or shortness of breath  Assessment/Plan: Principal Problem:   Rectus sheath hematoma-   S/P aortic valve replacement with metallic valve -CTA performed 9/20 revealed a contained hematoma in the left abdominal wall with no active extravasation-size unchanged from CT of the abdomen and pelvis performed the day before -Hematology and cardiology following and advising on further plan for anticoagulation -Cardiology recommending holding off on anticoagulation and then resuming Coumadin in 1 week -Awaiting final recommendations from hematology  Active Problems: Anemia due to acute blood loss -Status post 3 units of packed red blood cells -Hemoglobin 9.9 -We'll recheck tomorrow  Hyponatremia -Possibly a result of hypovolemia from acute blood loss -Urine osmolality was 351 yesterday, urine sodium 11 -Start slow normal saline and recheck sodium tomorrow morning -Despite receiving 3 units of blood no signs of fluid overload at this time  Paroxysmal atrial fibrillation -Currently rate controlled on digoxin and metoprolol  Possible 50% narrowing of proximal left renal artery and mid left common iliac artery -Noted on above-mentioned CTA-outpatient follow-up    Hypothyroidism -Continue Synthroid     Code Status:     Code Status Orders        Start     Ordered   12/19/14  1759  Do not attempt resuscitation (DNR)   Continuous    Question Answer Comment  In the event of cardiac or respiratory ARREST Do not call a "code blue"   In the event of cardiac or respiratory ARREST Do not perform Intubation, CPR, defibrillation or ACLS   In the event of cardiac or respiratory ARREST Use medication by any route, position, wound care, and other measures to relive pain and suffering. May use oxygen, suction and manual treatment of airway obstruction as needed for comfort.      12/19/14 1759    Advance Directive Documentation        Most Recent Value   Type of Advance Directive  Out of facility DNR (pink MOST or yellow form), Healthcare Power of Attorney, Living will   Pre-existing out of facility DNR order (yellow form or pink MOST form)     "MOST" Form in Place?       Family Communication: Daughter Disposition Plan: To be determined DVT prophylaxis: SCDs Consultants: Cardiology, hematology  Antibiotics: Anti-infectives    None      Objective: Filed Weights   12/19/14 1815 12/20/14 0300 12/21/14 0600  Weight: 56.5 kg (124 lb 9 oz) 59.3 kg (130 lb 11.7 oz) 59.4 kg (130 lb 15.3 oz)    Intake/Output Summary (Last 24 hours) at 12/21/14 1346 Last data filed at 12/21/14 0237  Gross per 24 hour  Intake   1535 ml  Output      0 ml  Net   1535 ml     Vitals Filed Vitals:   12/21/14 0600 12/21/14 0700 12/21/14 1011 12/21/14 1100  BP:  145/60  139/47  Pulse:   101   Temp:  98.9 F (37.2 C)  98.4 F (36.9 C)  TempSrc:  Oral  Oral  Resp:  16  14  Height:      Weight: 59.4 kg (130 lb 15.3 oz)     SpO2:  96%  96%    Exam:  General:  Pt is alert, not in acute distress  HEENT: No icterus, No thrush, oral mucosa moist  Cardiovascular: IIRR, S1/S2 No murmur  Respiratory: clear to auscultation bilaterally   Abdomen: Soft, +Bowel sounds, non tender, non distended, no guarding  MSK: No LE edema, cyanosis or clubbing  Data Reviewed: Basic Metabolic  Panel:  Recent Labs Lab 12/19/14 1120 12/20/14 0300 12/21/14 1126 12/21/14 1238  NA 124* 123* 129* 130*  K 4.8 4.6 3.9 4.3  CL 88* 90* 97* 97*  CO2 '26 27 28 28  '$ GLUCOSE 179* 114* 112* 90  BUN 21* 25* 16 15  CREATININE 1.05* 1.32* 0.78 0.86  CALCIUM 10.0 9.0 8.9 8.9   Liver Function Tests:  Recent Labs Lab 12/20/14 0300  AST 29  ALT 20  ALKPHOS 36*  BILITOT 0.3  PROT 4.7*  ALBUMIN 2.9*   No results for input(s): LIPASE, AMYLASE in the last 168 hours. No results for input(s): AMMONIA in the last 168 hours. CBC:  Recent Labs Lab 12/19/14 1120  12/20/14 0300 12/20/14 0930 12/20/14 1500 12/20/14 2110 12/21/14 0355  WBC 11.2*  < > 9.4 7.5 8.7 8.6 6.8  NEUTROABS 10.2*  --   --   --   --   --   --   HGB 9.2*  < > 8.1* 7.8* 7.6* 8.1* 9.6*  HCT 26.8*  < > 23.9* 22.6* 22.0* 24.0* 28.0*  MCV 98.2  < > 96.4 96.6 98.2 97.6 91.8  PLT 221  < > 180 156 164 151 146*  < > = values in this interval not displayed. Cardiac Enzymes:  Recent Labs Lab 12/19/14 1840  TROPONINI 0.03   BNP (last 3 results) No results for input(s): BNP in the last 8760 hours.  ProBNP (last 3 results) No results for input(s): PROBNP in the last 8760 hours.  CBG: No results for input(s): GLUCAP in the last 168 hours.  Recent Results (from the past 240 hour(s))  MRSA PCR Screening     Status: None   Collection Time: 12/19/14  6:37 PM  Result Value Ref Range Status   MRSA by PCR NEGATIVE NEGATIVE Final    Comment:        The GeneXpert MRSA Assay (FDA approved for NASAL specimens only), is one component of a comprehensive MRSA colonization surveillance program. It is not intended to diagnose MRSA infection nor to guide or monitor treatment for MRSA infections.      Studies: Ct Abdomen Pelvis Wo Contrast  12/19/2014   CLINICAL DATA:  Left abdominal pain since Saturday. Pain is getting progressively worse. Patient is on blood thinners.  EXAM: CT ABDOMEN AND PELVIS WITHOUT CONTRAST   TECHNIQUE: Multidetector CT imaging of the abdomen and pelvis was performed following the standard protocol without IV contrast.  COMPARISON:  12/31/2011 and 11/08/2014  FINDINGS: Lung bases are clear. Mild enlargement of the heart appears stable. There is no evidence for free intraperitoneal air.  There is a large heterogeneous structure involving the left rectus sheath compatible with a hematoma. This hematoma measures 18.6 x 11.0 x 15.7 cm. Active bleeding cannot be evaluated on this non contrast examination. There is subcutaneous  edema and stranding in the left anterior abdomen adjacent to the hematoma.  Again noted are innumerable renal cysts. One of the largest cysts measures up to 6.1 cm in the left kidney. There is compression on the left kidney and left abdominal structures from the large rectus sheath hematoma.  Again noted is a 1.8 cm low-density structure in left hepatic lobe which probably represents a hepatic cyst. The gallbladder has been removed. No gross abnormality to the spleen or pancreas.  Atherosclerotic calcifications in the aorta and visceral arteries without aneurysm. There is subcutaneous edema extending into the lower anterior abdominal and pelvic subcutaneous tissues. Fluid in the urinary bladder. Uterus is surgically absent. Stable nodular structures near the vaginal cuff. No acute abnormality or dilatation to the small or large bowel. Stable mild fullness in the left adrenal gland. Normal appearance of the right adrenal gland.  There is severe disc space loss at L1-L2 with approximately 6 mm of retrolisthesis at L1-L2. Vacuum disc phenomena at T12-L1. There is extensive subchondral sclerosis and endplate changes at S2-G3. Disease at L1-L2 is similar to the exam on 11/08/2014.  IMPRESSION: Large left-sided rectus sheath hematoma, measuring up to 18.6 cm.  Innumerable bilateral renal cysts.  Severe disc space disease at L1-L2.  These results were called by telephone at the time of  interpretation on 12/19/2014 at 2:20 pm to Dr. Daleen Bo , who verbally acknowledged these results.   Electronically Signed   By: Markus Daft M.D.   On: 12/19/2014 14:21   Ct Angio Abd/pel W/ And/or W/o  12/20/2014   CLINICAL DATA:  81 year old female with a history of mechanical heart valve on Lovenox injections for therapeutic anti coagulation, has developed rectus sheath hematoma. Initial CT is noncontrast, and the hematoma has grown over the course of 2-3 days. Current study has been performed to evaluate for ongoing bleeding, given the need for continued anti coagulation.  EXAM: CTA ABDOMEN AND PELVIS wITHOUT AND WITH CONTRAST  TECHNIQUE: Multidetector CT imaging of the abdomen and pelvis was performed using the standard protocol during bolus administration of intravenous contrast. Multiplanar reconstructed images and MIPs were obtained and reviewed to evaluate the vascular anatomy.  CONTRAST:  147m OMNIPAQUE IOHEXOL 350 MG/ML SOLN  COMPARISON:  Noncontrast CT 12/19/2014  FINDINGS: Lower chest:  Surgical changes of median sternotomy.  Right atrial enlargement.  No pericardial fluid/thickening.  No lower mediastinal adenopathy.  Unremarkable appearance of the distal esophagus.  Small hiatal hernia.  Respiratory motion somewhat limits evaluation the lungs, though there is no large pleural effusion or confluent airspace disease.  Abdomen/pelvis:  Vascular:  Aorta:  Calcifications are present at the length of the abdominal aorta. No aneurysmal dilation. No dissection flap. Tortuosity descending thoracic aorta.  Mesenteric vessels:  Calcifications are present at the origin of the celiac artery, superior mesenteric artery, and the bilateral renal arteries. Calcifications and soft plaque present at the origin of the inferior mesenteric artery.  There may be 50% stenosis at the origin of the left renal artery, and there may be 50% stenosis at the origin of the inferior mesenteric artery.  Right lower extremity:   Calcifications present throughout the length of the right iliac system with tortuosity. Right hypogastric artery is patent. No aneurysm or dissection flap. Focal fusiform ectasia of the right common iliac artery measuring 12 mm.  Left lower extremity:  Atherosclerotic calcifications are present the length of the left iliac system. At the mid left common iliac artery there is circumferential soft plaquing calcification  which may contribute to 50% stenosis of the common iliac artery. Hypogastric artery is either nearly occluded with reconstitution of flow or occluded. Fusiform at ectasia of the left common iliac artery measuring 11 mm.  Contained hematoma of the left-sided abdominal wall musculature. No evidence of active extravasation. Developing fluid fluid level with layering of the dense blood products at the posterior hematoma.  The axial, coronal, and sagittal images demonstrate slightly decreased diameters in all dimensions  Current: axial: 10.1 cm x 16.9 cm, coronal: 17.5 cm, sagittal 14.8 cm  Prior: Axial: 11.1 cm x 15.8 cm, coronal: 18.6 cm, sagittal: 16.3 cm  The overall volume is likely unchanged, with the size difference potentially representing redistribution.  Nonvascular:  Hypodense rounded lesion within segment 4B, compatible with benign cysts. Otherwise unremarkable appearance of the liver.  Unremarkable spleen.  Unremarkable pancreas.  Unremarkable bilateral adrenal glands.  No abnormally distended small bowel or colon. No inflammatory changes of the mesenteric. Colonic diverticular disease.  Surgical changes of cholecystectomy.  Innumerable bilateral renal cystic lesions. The majority of these are compatible with Bosniak 1 cyst. Inferior left-sided lesion with high density/calcium within the cyst on the posterior cortex measures 3.4 cm and cannot be characterized as a Bosniak 1 cyst.  No evidence of left or right-sided hydronephrosis. No left or right-sided nephrolithiasis. No ureteral stone.  Unremarkable appearance of the urinary bladder wall. Likely small cystocele.  Diffuse osteopenia. No displaced fracture identified. Multilevel degenerative changes of the visualized spine. Degenerative disc disease is worst at the L1-L2 level. No focal significant bony canal narrowing.  IMPRESSION: Contained hematoma of the left-sided abdominal wall musculature with no evidence of active extravasation. Size of the hematoma is unchanged from the comparison study.  Atherosclerosis of the abdominal aorta and bilateral iliac system.  Right atrial enlargement with changes of prior median sternotomy, compatible with the patient's history of prior valve surgery.  There may be 50% narrowing of the proximal left renal artery, as well as the mid left common iliac artery.  Innumerable bilateral renal cysts, all of which appear to be compatible with Bosniak 1 cysts and Bosniak 2 cysts.  Additional findings as above.  Signed,  Dulcy Fanny. Earleen Newport, DO  [facility]  [facility]   Electronically Signed   By: Corrie Mckusick D.O.   On: 12/20/2014 11:35    Scheduled Meds:  Scheduled Meds: . digoxin  0.125 mg Oral Daily  . levothyroxine  88 mcg Oral QAC breakfast  . loratadine  10 mg Oral Daily  . losartan  50 mg Oral BID  . metoprolol tartrate  12.5 mg Oral Daily  . multivitamin with minerals  1 tablet Oral Daily  . omega-3 acid ethyl esters  1,000 mg Oral BID  . simvastatin  10 mg Oral Daily   Continuous Infusions:   Time spent on care of this patient: 35 min   Jefferson Davis, MD 12/21/2014, 1:46 PM  LOS: 2 days   [facility] Office  [phone] Pager - Text Page per [email or web address] If 7PM-7AM, please contact night-coverage [email or web address]

## 2014-12-21 NOTE — Progress Notes (Signed)
     SUBJECTIVE: Only c/o soreness abdomen. No chest pain or SOB.   BP 145/60 mmHg  Pulse 75  Temp(Src) 98.9 F (37.2 C) (Oral)  Resp 16  Ht '5\' 1"'$  (1.549 m)  Wt 130 lb 15.3 oz (59.4 kg)  BMI 24.76 kg/m2  SpO2 96%  Intake/Output Summary (Last 24 hours) at 12/21/14 0943 Last data filed at 12/21/14 4818  Gross per 24 hour  Intake   1955 ml  Output      0 ml  Net   1955 ml    PHYSICAL EXAM General: Well developed, well nourished, in no acute distress. Alert and oriented x 3.  Psych:  Good affect, responds appropriately Neck: No JVD. No masses noted.  Lungs: Clear bilaterally with no wheezes or rhonci noted.  Heart: Irreg with no murmurs noted. Abdomen: Bowel sounds are present. Large ecchymosis around abdomen. Tender to palpation. BS present.  Extremities: No lower extremity edema.   LABS: Basic Metabolic Panel:  Recent Labs  12/19/14 1120 12/20/14 0300  NA 124* 123*  K 4.8 4.6  CL 88* 90*  CO2 26 27  GLUCOSE 179* 114*  BUN 21* 25*  CREATININE 1.05* 1.32*  CALCIUM 10.0 9.0   CBC:  Recent Labs  12/19/14 1120  12/20/14 2110 12/21/14 0355  WBC 11.2*  < > 8.6 6.8  NEUTROABS 10.2*  --   --   --   HGB 9.2*  < > 8.1* 9.6*  HCT 26.8*  < > 24.0* 28.0*  MCV 98.2  < > 97.6 91.8  PLT 221  < > 151 146*  < > = values in this interval not displayed. Cardiac Enzymes:  Recent Labs  12/19/14 1840  TROPONINI 0.03   Current Meds: . digoxin  0.125 mg Oral Daily  . levothyroxine  88 mcg Oral QAC breakfast  . loratadine  10 mg Oral Daily  . losartan  50 mg Oral BID  . metoprolol tartrate  12.5 mg Oral Daily  . multivitamin with minerals  1 tablet Oral Daily  . omega-3 acid ethyl esters  1,000 mg Oral BID  . simvastatin  10 mg Oral Daily    ASSESSMENT AND PLAN:  1. Acute blood loss anemia:  secondary to rectus sheath hematoma. Coumadin on hold. H/H stable post transfusion 2 units pRBCs yesterday. Serial H/H.    2. Mechanical aortic valve replacement with [employer] prosthesis: Coumadin on hold with rectus sheath hematoma. Recommendations to hold coumadin for one week. Would not use Lovenox or heparin with anemia/hematoma.   3. Paroxysmal atrial fibrillation: She is in atrial fib this am. Rate controlled. She is on digoxin, metoprolol. Will check digoxin level today.   MCALHANY,CHRISTOPHER  9/21/20169:43 AM

## 2014-12-22 ENCOUNTER — Other Ambulatory Visit: Payer: Medicare Other

## 2014-12-22 ENCOUNTER — Other Ambulatory Visit: Payer: Self-pay | Admitting: Oncology

## 2014-12-22 DIAGNOSIS — I482 Chronic atrial fibrillation: Secondary | ICD-10-CM

## 2014-12-22 DIAGNOSIS — S301XXS Contusion of abdominal wall, sequela: Secondary | ICD-10-CM

## 2014-12-22 DIAGNOSIS — T148 Other injury of unspecified body region: Secondary | ICD-10-CM

## 2014-12-22 LAB — CBC
HCT: 27.1 % — ABNORMAL LOW (ref 36.0–46.0)
Hemoglobin: 9.3 g/dL — ABNORMAL LOW (ref 12.0–15.0)
MCH: 32.1 pg (ref 26.0–34.0)
MCHC: 34.3 g/dL (ref 30.0–36.0)
MCV: 93.4 fL (ref 78.0–100.0)
PLATELETS: 150 10*3/uL (ref 150–400)
RBC: 2.9 MIL/uL — AB (ref 3.87–5.11)
RDW: 19.6 % — ABNORMAL HIGH (ref 11.5–15.5)
WBC: 5.3 10*3/uL (ref 4.0–10.5)

## 2014-12-22 LAB — TYPE AND SCREEN
ABO/RH(D): O POS
Antibody Screen: NEGATIVE
UNIT DIVISION: 0
UNIT DIVISION: 0
Unit division: 0

## 2014-12-22 LAB — BASIC METABOLIC PANEL
Anion gap: 4 — ABNORMAL LOW (ref 5–15)
BUN: 11 mg/dL (ref 6–20)
CHLORIDE: 101 mmol/L (ref 101–111)
CO2: 28 mmol/L (ref 22–32)
CREATININE: 0.75 mg/dL (ref 0.44–1.00)
Calcium: 8.6 mg/dL — ABNORMAL LOW (ref 8.9–10.3)
GFR calc non Af Amer: >60 mL/min (ref >60)
GLUCOSE: 95 mg/dL (ref 65–99)
Potassium: 3.8 mmol/L (ref 3.5–5.1)
Sodium: 133 mmol/L — ABNORMAL LOW (ref 135–145)

## 2014-12-22 LAB — DIGOXIN LEVEL: Digoxin Level: 0.5 ng/mL — ABNORMAL LOW (ref 0.8–2.0)

## 2014-12-22 MED ORDER — WARFARIN SODIUM 5 MG PO TABS: 5.0000 mg | ORAL_TABLET | Freq: Every day | ORAL | Status: DC

## 2014-12-22 MED ORDER — HYDROXYZINE PAMOATE 25 MG PO CAPS: 25.0000 mg | ORAL_CAPSULE | Freq: Three times a day (TID) | ORAL | Status: DC | PRN

## 2014-12-22 NOTE — Progress Notes (Signed)
Pt got discharged, discharge instructions provided and patient showed understanding, following up with PCP in 1 week, pt left in wheelchair with all of her belongings in a stable condition.

## 2014-12-22 NOTE — Telephone Encounter (Signed)
Pt discharged today and dtr Dixon Boos has med questions pls call [phone]

## 2014-12-22 NOTE — Telephone Encounter (Signed)
F/u    Please is being discharged from hospital today and need a f/u tomorrow 12/23/14 per Langley Gauss from [facility].Marland Kitchen Please call pt to schedule f/u for tomorrow and if any question please call Palma Holter (RN) at [phone]

## 2014-12-22 NOTE — Telephone Encounter (Signed)
Reviewed with Gerri Spore PA and ok to just have patient seen next week by  Dr. Mare Ferrari Patient did not want to come for ov tomorrow, has labs scheduled at cancer center

## 2014-12-22 NOTE — Discharge Summary (Addendum)
Physician Discharge Summary  Deborah [name] UEA:[id number] DOB: October 17, 1926 DOA: 12/19/2014  PCP: [name], MD  Admit date: 12/19/2014 Discharge date: 12/22/2014  Time spent:60 minutes  Recommendations for Outpatient Follow-up:  1. Start Coumadin in 1 week--check INR based on cardiology recommendations  Discharge Condition: Stable Diet recommendation: Heart healthy low-sodium  Discharge Diagnoses:  Principal Problem:   Rectus sheath hematoma Active Problems:   Hypothyroidism   S/P aortic valve replacement with metallic valve   Hyponatremia   Long term current use of anticoagulant therapy   Atrial fibrillation   Dyslipidemia   Pain   Acute blood loss anemia   Hematoma   Absolute anemia   History of present illness:  Deborah [name] is a 81 y.o. female with a history of anemia, hypothyroidism, mechanical aortic valve and GI bleeds on Coumadin. The patient has been on Lovenox since February of this year for atrial fibrillation. She presents for abdominal pain and found to have a large left-sided rectal sheath hematoma. Due to acute drop in hemoglobin as a result of this hematoma, she has received 3 units of packed red blood cells  Hospital Course:  Principal Problem:  Rectus sheath hematoma- S/P aortic valve replacement with metallic valve -CTA performed 9/20 revealed a contained hematoma in the left abdominal wall with no active extravasation-size unchanged from CT of the abdomen and pelvis performed the day before -Hematology and cardiology following and advising on further plan for anticoagulation -As mentioned above, she has been on Lovenox injections since February of this year -Cardiology recommending holding off on anticoagulation for now and then starting Coumadin in 1 week -Hematology in agreement with this plan but with recommendation for INR of 2.0-2.5-follow-up appointment with Dr. Mare Ferrari is being made for tomorrow  Active Problems: Anemia due to acute  blood loss - has stopped bleeding -Status post 3 units of packed red blood cells -Hemoglobin 9.6, 9.9 and 9.3 for past 3 readings  Hyponatremia -Possibly a result of hypovolemia from acute blood loss -Urine osmolality was 351, urine sodium 11 -Started slow normal saline with improvement in repeat Na which is 133 today -Despite receiving 3 units of blood and IVF. no signs of fluid overload at this time  Paroxysmal atrial fibrillation -Currently rate controlled on digoxin and metoprolol  Possible 50% narrowing of proximal left renal artery and mid left common iliac artery -Noted on above-mentioned CTA-outpatient follow-up   Hypothyroidism -Continue Synthroid  Consultations:  Hematology  Cardiology  Discharge Exam: Filed Weights   12/21/14 0600 12/21/14 2128 12/22/14 0601  Weight: 59.4 kg (130 lb 15.3 oz) 59.058 kg (130 lb 3.2 oz) 58.877 kg (129 lb 12.8 oz)   Filed Vitals:   12/22/14 0900  BP: 106/48  Pulse: 77  Temp: 97.8 F (36.6 C)  Resp:     General: AAO x 3, no distress Cardiovascular: RRR, no murmurs  Respiratory: clear to auscultation bilaterally GI: soft, non-tender, non-distended, bowel sound positive  Discharge Instructions You were cared for by a hospitalist during your hospital stay. If you have any questions about your discharge medications or the care you received while you were in the hospital after you are discharged, you can call the unit and asked to speak with the hospitalist on call if the hospitalist that took care of you is not available. Once you are discharged, your primary care physician will handle any further medical issues. Please note that NO REFILLS for any discharge medications will be authorized once you are discharged, as it is  imperative that you return to your primary care physician (or establish a relationship with a primary care physician if you do not have one) for your aftercare needs so that they can reassess your need for  medications and monitor your lab values.      Discharge Instructions    Care order/instruction    Complete by:  As directed   Transfuse Parameters     Complete patient signature process for consent form    Complete by:  As directed      Diet - low sodium heart healthy    Complete by:  As directed      Discharge instructions    Complete by:  As directed   See Dr Mare Ferrari on Friday and discuss re-starting Coumadin. Resume your Coumadin in on 9/27- start with '5mg'$  a day and follow up at the Coumadin [facility]ine on 9/30 to have INR checked     Increase activity slowly    Complete by:  As directed      Practitioner attestation of consent    Complete by:  As directed   I, the ordering practitioner, attest that I have discussed with the patient the benefits, risks, side effects, alternatives, likelihood of achieving goals and potential problems during recovery for the procedure listed.  Procedure:  Blood Product(s)            Medication List    STOP taking these medications        enoxaparin 100 MG/ML injection  Commonly known as:  LOVENOX      TAKE these medications        acetaminophen 500 MG tablet  Commonly known as:  TYLENOL  Take 1,000 mg by mouth every 6 (six) hours as needed for headache.     albuterol 108 (90 BASE) MCG/ACT inhaler  Commonly known as:  PROVENTIL HFA;VENTOLIN HFA  Inhale 1-2 puffs into the lungs every 6 (six) hours as needed for wheezing or shortness of breath.     ALLEGRA ALLERGY 180 MG tablet  Generic drug:  fexofenadine  Take 180 mg by mouth daily as needed for allergies.     allopurinol 300 MG tablet  Commonly known as:  ZYLOPRIM  TAKE ONE TABLET EACH DAY     amLODipine 5 MG tablet  Commonly known as:  NORVASC  Take 1 tablet (5 mg total) by mouth 2 (two) times daily.     BENTYL 10 MG capsule  Generic drug:  dicyclomine  Take 10 mg by mouth daily as needed for spasms.     calcium-vitamin D 500 MG tablet  Take 1 tablet by mouth 2 (two) times  daily.     DIGOX 0.125 MG tablet  Generic drug:  digoxin  TAKE ONE TABLET EACH DAY     Fish Oil 1000 MG Caps  Take 1,000 mg by mouth 2 (two) times daily.     furosemide 40 MG tablet  Commonly known as:  LASIX  Take 40 mg by mouth daily as needed (for swelling).     hydrochlorothiazide 12.5 MG capsule  Commonly known as:  MICROZIDE  Take 1 capsule (12.5 mg total) by mouth 2 (two) times daily.     levothyroxine 88 MCG tablet  Commonly known as:  SYNTHROID, LEVOTHROID  Take 88 mcg by mouth daily before breakfast.     lidocaine-prilocaine cream  Commonly known as:  EMLA  Apply 1 application topically as needed (prior to infusion at porta cath site).     losartan 50  MG tablet  Commonly known as:  COZAAR  Take 1 tablet (50 mg total) by mouth 2 (two) times daily.     metoprolol tartrate 25 MG tablet  Commonly known as:  LOPRESSOR  Take 12.5 mg by mouth 2 (two) times daily. 1/2 twice a day     multivitamin per tablet  Take 1 tablet by mouth daily.     NITROSTAT 0.4 MG SL tablet  Generic drug:  nitroGLYCERIN     potassium chloride SA 20 MEQ tablet  Commonly known as:  K-DUR,KLOR-CON  Take 20 mEq by mouth daily.     simvastatin 20 MG tablet  Commonly known as:  ZOCOR  Take 10 mg by mouth daily.     zolpidem 12.5 MG CR tablet  Commonly known as:  AMBIEN CR  TAKE ONE TABLET AT BEDTIME AS NEEDED       Allergies  Allergen Reactions  . Amiodarone Shortness Of Breath  . Alendronate Sodium     GI upset  . Macrodantin [Nitrofurantoin]     GI upset  . Meclizine Swelling    tongue  . Norvasc [Amlodipine Besylate]     edema  . Tussionex Pennkinetic Er [Hydrocod Polst-Cpm Polst Er] Other (See Comments)    Reaction unknown   Follow-up Information    Follow up with Warren Danes, MD On 12/23/2014.   Specialty:  Cardiology   Why:  Nurse will call patient this afternoon to schedule appt.   Contact information:   [address] [place]  [address] [phone]       Follow up with [name], MD On 12/30/2014.   Specialty:  Oncology   Why:  @ 10:30   Contact information:   [place] [address] [phone]       Follow up with Mathews Argyle, MD In 1 week.   Specialty:  Internal Medicine   Why:  Patient to call office to schedule appt. ASAP   Contact information:   [address] [place] [address] [phone]        The results of significant diagnostics from this hospitalization (including imaging, microbiology, ancillary and laboratory) are listed below for reference.    Significant Diagnostic Studies: Ct Abdomen Pelvis Wo Contrast  12/19/2014   CLINICAL DATA:  Left abdominal pain since Saturday. Pain is getting progressively worse. Patient is on blood thinners.  EXAM: CT ABDOMEN AND PELVIS WITHOUT CONTRAST  TECHNIQUE: Multidetector CT imaging of the abdomen and pelvis was performed following the standard protocol without IV contrast.  COMPARISON:  12/31/2011 and 11/08/2014  FINDINGS: Lung bases are clear. Mild enlargement of the heart appears stable. There is no evidence for free intraperitoneal air.  There is a large heterogeneous structure involving the left rectus sheath compatible with a hematoma. This hematoma measures 18.6 x 11.0 x 15.7 cm. Active bleeding cannot be evaluated on this non contrast examination. There is subcutaneous edema and stranding in the left anterior abdomen adjacent to the hematoma.  Again noted are innumerable renal cysts. One of the largest cysts measures up to 6.1 cm in the left kidney. There is compression on the left kidney and left abdominal structures from the large rectus sheath hematoma.  Again noted is a 1.8 cm low-density structure in left hepatic lobe which probably represents a hepatic cyst. The gallbladder has been removed. No gross abnormality to the spleen or pancreas.  Atherosclerotic calcifications in the aorta and visceral  arteries without aneurysm.  There is subcutaneous edema extending into the lower anterior abdominal and pelvic subcutaneous tissues. Fluid in the urinary bladder. Uterus is surgically absent. Stable nodular structures near the vaginal cuff. No acute abnormality or dilatation to the small or large bowel. Stable mild fullness in the left adrenal gland. Normal appearance of the right adrenal gland.  There is severe disc space loss at L1-L2 with approximately 6 mm of retrolisthesis at L1-L2. Vacuum disc phenomena at T12-L1. There is extensive subchondral sclerosis and endplate changes at Q0-H4. Disease at L1-L2 is similar to the exam on 11/08/2014.  IMPRESSION: Large left-sided rectus sheath hematoma, measuring up to 18.6 cm.  Innumerable bilateral renal cysts.  Severe disc space disease at L1-L2.  These results were called by telephone at the time of interpretation on 12/19/2014 at 2:20 pm to Dr. Daleen Bo , who verbally acknowledged these results.   Electronically Signed   By: Markus Daft M.D.   On: 12/19/2014 14:21   Ct Angio Abd/pel W/ And/or W/o  12/20/2014   CLINICAL DATA:  81 year old female with a history of mechanical heart valve on Lovenox injections for therapeutic anti coagulation, has developed rectus sheath hematoma. Initial CT is noncontrast, and the hematoma has grown over the course of 2-3 days. Current study has been performed to evaluate for ongoing bleeding, given the need for continued anti coagulation.  EXAM: CTA ABDOMEN AND PELVIS wITHOUT AND WITH CONTRAST  TECHNIQUE: Multidetector CT imaging of the abdomen and pelvis was performed using the standard protocol during bolus administration of intravenous contrast. Multiplanar reconstructed images and MIPs were obtained and reviewed to evaluate the vascular anatomy.  CONTRAST:  170m OMNIPAQUE IOHEXOL 350 MG/ML SOLN  COMPARISON:  Noncontrast CT 12/19/2014  FINDINGS: Lower chest:  Surgical changes of median sternotomy.  Right atrial enlargement.   No pericardial fluid/thickening.  No lower mediastinal adenopathy.  Unremarkable appearance of the distal esophagus.  Small hiatal hernia.  Respiratory motion somewhat limits evaluation the lungs, though there is no large pleural effusion or confluent airspace disease.  Abdomen/pelvis:  Vascular:  Aorta:  Calcifications are present at the length of the abdominal aorta. No aneurysmal dilation. No dissection flap. Tortuosity descending thoracic aorta.  Mesenteric vessels:  Calcifications are present at the origin of the celiac artery, superior mesenteric artery, and the bilateral renal arteries. Calcifications and soft plaque present at the origin of the inferior mesenteric artery.  There may be 50% stenosis at the origin of the left renal artery, and there may be 50% stenosis at the origin of the inferior mesenteric artery.  Right lower extremity:  Calcifications present throughout the length of the right iliac system with tortuosity. Right hypogastric artery is patent. No aneurysm or dissection flap. Focal fusiform ectasia of the right common iliac artery measuring 12 mm.  Left lower extremity:  Atherosclerotic calcifications are present the length of the left iliac system. At the mid left common iliac artery there is circumferential soft plaquing calcification which may contribute to 50% stenosis of the common iliac artery. Hypogastric artery is either nearly occluded with reconstitution of flow or occluded. Fusiform at ectasia of the left common iliac artery measuring 11 mm.  Contained hematoma of the left-sided abdominal wall musculature. No evidence of active extravasation. Developing fluid fluid level with layering of the dense blood products at the posterior hematoma.  The axial, coronal, and sagittal images demonstrate slightly decreased diameters in all dimensions  Current: axial: 10.1 cm x 16.9 cm, coronal: 17.5 cm, sagittal 14.8 cm  Prior:  Axial: 11.1 cm x 15.8 cm, coronal: 18.6 cm, sagittal: 16.3 cm  The  overall volume is likely unchanged, with the size difference potentially representing redistribution.  Nonvascular:  Hypodense rounded lesion within segment 4B, compatible with benign cysts. Otherwise unremarkable appearance of the liver.  Unremarkable spleen.  Unremarkable pancreas.  Unremarkable bilateral adrenal glands.  No abnormally distended small bowel or colon. No inflammatory changes of the mesenteric. Colonic diverticular disease.  Surgical changes of cholecystectomy.  Innumerable bilateral renal cystic lesions. The majority of these are compatible with Bosniak 1 cyst. Inferior left-sided lesion with high density/calcium within the cyst on the posterior cortex measures 3.4 cm and cannot be characterized as a Bosniak 1 cyst.  No evidence of left or right-sided hydronephrosis. No left or right-sided nephrolithiasis. No ureteral stone. Unremarkable appearance of the urinary bladder wall. Likely small cystocele.  Diffuse osteopenia. No displaced fracture identified. Multilevel degenerative changes of the visualized spine. Degenerative disc disease is worst at the L1-L2 level. No focal significant bony canal narrowing.  IMPRESSION: Contained hematoma of the left-sided abdominal wall musculature with no evidence of active extravasation. Size of the hematoma is unchanged from the comparison study.  Atherosclerosis of the abdominal aorta and bilateral iliac system.  Right atrial enlargement with changes of prior median sternotomy, compatible with the patient's history of prior valve surgery.  There may be 50% narrowing of the proximal left renal artery, as well as the mid left common iliac artery.  Innumerable bilateral renal cysts, all of which appear to be compatible with Bosniak 1 cysts and Bosniak 2 cysts.  Additional findings as above.  Signed,  Dulcy Fanny. Earleen Newport, DO  [facility]  [facility]   Electronically Signed   By: Corrie Mckusick D.O.   On: 12/20/2014 11:35     Microbiology: Recent Results (from the past 240 hour(s))  MRSA PCR Screening     Status: None   Collection Time: 12/19/14  6:37 PM  Result Value Ref Range Status   MRSA by PCR NEGATIVE NEGATIVE Final    Comment:        The GeneXpert MRSA Assay (FDA approved for NASAL specimens only), is one component of a comprehensive MRSA colonization surveillance program. It is not intended to diagnose MRSA infection nor to guide or monitor treatment for MRSA infections.      Labs: Basic Metabolic Panel:  Recent Labs Lab 12/19/14 1120 12/20/14 0300 12/21/14 1126 12/21/14 1238 12/22/14 0440  NA 124* 123* 129* 130* 133*  K 4.8 4.6 3.9 4.3 3.8  CL 88* 90* 97* 97* 101  CO2 '26 27 28 28 28  '$ GLUCOSE 179* 114* 112* 90 95  BUN 21* 25* '16 15 11  '$ CREATININE 1.05* 1.32* 0.78 0.86 0.75  CALCIUM 10.0 9.0 8.9 8.9 8.6*   Liver Function Tests:  Recent Labs Lab 12/20/14 0300  AST 29  ALT 20  ALKPHOS 36*  BILITOT 0.3  PROT 4.7*  ALBUMIN 2.9*   No results for input(s): LIPASE, AMYLASE in the last 168 hours. No results for input(s): AMMONIA in the last 168 hours. CBC:  Recent Labs Lab 12/19/14 1120  12/20/14 0930 12/20/14 1500 12/20/14 2110 12/21/14 0355 12/21/14 1623 12/22/14 0440  WBC 11.2*  < > 7.5 8.7 8.6 6.8  --  5.3  NEUTROABS 10.2*  --   --   --   --   --   --   --   HGB 9.2*  < > 7.8* 7.6* 8.1* 9.6* 9.9*  9.3*  HCT 26.8*  < > 22.6* 22.0* 24.0* 28.0* 29.0* 27.1*  MCV 98.2  < > 96.6 98.2 97.6 91.8  --  93.4  PLT 221  < > 156 164 151 146*  --  150  < > = values in this interval not displayed. Cardiac Enzymes:  Recent Labs Lab 12/19/14 1840  TROPONINI 0.03   BNP: BNP (last 3 results) No results for input(s): BNP in the last 8760 hours.  ProBNP (last 3 results) No results for input(s): PROBNP in the last 8760 hours.  CBG: No results for input(s): GLUCAP in the last 168 hours.     SignedDebbe Odea, MD [facility] 12/22/2014, 11:34  AM

## 2014-12-22 NOTE — Care Management Important Message (Signed)
Important Message  Patient Details  Name: Deborah [name] MRN: [id number] Date of Birth: 09-23-1926   Medicare Important Message Given:  Yes-second notification given    Delorse Lek 12/22/2014, 9:22 AM

## 2014-12-22 NOTE — Progress Notes (Signed)
Hemoglobin appears to be stabilizing  Results for LEITH, HEDLUND (MRN [id number]) as of 12/22/2014 08:34  Ref. Range 12/20/2014 15:00 12/20/2014 21:10 12/21/2014 03:55 12/21/2014 16:23 12/22/2014 04:40  Hemoglobin Latest Ref Range: 12.0-15.0 g/dL 7.6 (L) 8.1 (L) 9.6 (L) 9.9 (L) 9.3 (L)   If discharged today would check her Hb tomorrow AM-- have set up for our office-- they will call her w time-- so if there is a further drop we can consider transfusion before weekend  I would vote for coumadin with an INR 2.0-2.5 range (instead of 2.5+).) We no longer have a coumadin clinic-- patient should be set up for cioumadin f/u through Dr Sherryl Barters office  Appreciate your help to this patient!  Will follow with you

## 2014-12-23 ENCOUNTER — Other Ambulatory Visit (HOSPITAL_BASED_OUTPATIENT_CLINIC_OR_DEPARTMENT_OTHER): Payer: Medicare Other

## 2014-12-23 ENCOUNTER — Ambulatory Visit (HOSPITAL_BASED_OUTPATIENT_CLINIC_OR_DEPARTMENT_OTHER): Payer: Medicare Other

## 2014-12-23 DIAGNOSIS — Z954 Presence of other heart-valve replacement: Secondary | ICD-10-CM

## 2014-12-23 DIAGNOSIS — Z7901 Long term (current) use of anticoagulants: Secondary | ICD-10-CM

## 2014-12-23 DIAGNOSIS — K2961 Other gastritis with bleeding: Secondary | ICD-10-CM

## 2014-12-23 DIAGNOSIS — D689 Coagulation defect, unspecified: Secondary | ICD-10-CM

## 2014-12-23 DIAGNOSIS — D500 Iron deficiency anemia secondary to blood loss (chronic): Secondary | ICD-10-CM

## 2014-12-23 DIAGNOSIS — Z95828 Presence of other vascular implants and grafts: Secondary | ICD-10-CM

## 2014-12-23 DIAGNOSIS — I4891 Unspecified atrial fibrillation: Secondary | ICD-10-CM

## 2014-12-23 LAB — CBC & DIFF AND RETIC
BASO%: 0.2 % (ref 0.0–2.0)
BASOS ABS: 0 10*3/uL (ref 0.0–0.1)
EOS ABS: 0 10*3/uL (ref 0.0–0.5)
EOS%: 0.6 % (ref 0.0–7.0)
HEMATOCRIT: 29.8 % — AB (ref 34.8–46.6)
HEMOGLOBIN: 9.9 g/dL — AB (ref 11.6–15.9)
IMMATURE RETIC FRACT: 18.9 % — AB (ref 1.60–10.00)
LYMPH%: 8.1 % — AB (ref 14.0–49.7)
MCH: 31.9 pg (ref 25.1–34.0)
MCHC: 33.2 g/dL (ref 31.5–36.0)
MCV: 96.1 fL (ref 79.5–101.0)
MONO#: 0.6 10*3/uL (ref 0.1–0.9)
MONO%: 8.7 % (ref 0.0–14.0)
NEUT#: 5.5 10*3/uL (ref 1.5–6.5)
NEUT%: 82.4 % — AB (ref 38.4–76.8)
PLATELETS: 200 10*3/uL (ref 145–400)
RBC: 3.1 10*6/uL — ABNORMAL LOW (ref 3.70–5.45)
RDW: 19.9 % — ABNORMAL HIGH (ref 11.2–14.5)
Retic %: 7.3 % — ABNORMAL HIGH (ref 0.70–2.10)
Retic Ct Abs: 226.3 10*3/uL — ABNORMAL HIGH (ref 33.70–90.70)
WBC: 6.6 10*3/uL (ref 3.9–10.3)
lymph#: 0.5 10*3/uL — ABNORMAL LOW (ref 0.9–3.3)
nRBC: 0 % (ref 0–0)

## 2014-12-23 LAB — FERRITIN CHCC: Ferritin: 719 ng/ml — ABNORMAL HIGH (ref 9–269)

## 2014-12-23 LAB — HOLD TUBE, BLOOD BANK

## 2014-12-23 MED ORDER — SODIUM CHLORIDE 0.9 % IJ SOLN
10.0000 mL | INTRAMUSCULAR | Status: DC | PRN
  Administered 2014-12-23: 10 mL via INTRAVENOUS
  Filled 2014-12-23: qty 10

## 2014-12-23 MED ORDER — HEPARIN SOD (PORK) LOCK FLUSH 100 UNIT/ML IV SOLN
500.0000 [IU] | Freq: Once | INTRAVENOUS | Status: AC
  Administered 2014-12-23: 500 [IU] via INTRAVENOUS
  Filled 2014-12-23: qty 5

## 2014-12-23 NOTE — Telephone Encounter (Signed)
Scheduled ov with  Dr. Mare Ferrari for patient 9/30 when he is back in the office

## 2014-12-23 NOTE — Patient Instructions (Signed)

## 2014-12-24 ENCOUNTER — Other Ambulatory Visit (HOSPITAL_COMMUNITY): Payer: Self-pay

## 2014-12-24 ENCOUNTER — Other Ambulatory Visit: Payer: Self-pay

## 2014-12-24 ENCOUNTER — Emergency Department (HOSPITAL_COMMUNITY): Payer: Medicare Other

## 2014-12-24 ENCOUNTER — Encounter (HOSPITAL_COMMUNITY): Payer: Self-pay | Admitting: Emergency Medicine

## 2014-12-24 ENCOUNTER — Observation Stay (HOSPITAL_COMMUNITY)
Admission: EM | Admit: 2014-12-24 | Discharge: 2014-12-26 | Disposition: A | Discharge status: Home / Self Care | Payer: Medicare Other | Attending: Internal Medicine | Admitting: Internal Medicine

## 2014-12-24 DIAGNOSIS — K219 Gastro-esophageal reflux disease without esophagitis: Secondary | ICD-10-CM | POA: Diagnosis present

## 2014-12-24 DIAGNOSIS — M109 Gout, unspecified: Secondary | ICD-10-CM | POA: Diagnosis not present

## 2014-12-24 DIAGNOSIS — Z87891 Personal history of nicotine dependence: Secondary | ICD-10-CM | POA: Insufficient documentation

## 2014-12-24 DIAGNOSIS — D500 Iron deficiency anemia secondary to blood loss (chronic): Secondary | ICD-10-CM | POA: Insufficient documentation

## 2014-12-24 DIAGNOSIS — I482 Chronic atrial fibrillation, unspecified: Secondary | ICD-10-CM | POA: Diagnosis present

## 2014-12-24 DIAGNOSIS — Z952 Presence of prosthetic heart valve: Secondary | ICD-10-CM | POA: Insufficient documentation

## 2014-12-24 DIAGNOSIS — R531 Weakness: Secondary | ICD-10-CM | POA: Insufficient documentation

## 2014-12-24 DIAGNOSIS — K922 Gastrointestinal hemorrhage, unspecified: Secondary | ICD-10-CM | POA: Diagnosis present

## 2014-12-24 DIAGNOSIS — E871 Hypo-osmolality and hyponatremia: Secondary | ICD-10-CM | POA: Insufficient documentation

## 2014-12-24 DIAGNOSIS — R0602 Shortness of breath: Secondary | ICD-10-CM | POA: Insufficient documentation

## 2014-12-24 DIAGNOSIS — G459 Transient cerebral ischemic attack, unspecified: Secondary | ICD-10-CM | POA: Diagnosis not present

## 2014-12-24 DIAGNOSIS — Z7901 Long term (current) use of anticoagulants: Secondary | ICD-10-CM | POA: Insufficient documentation

## 2014-12-24 DIAGNOSIS — R109 Unspecified abdominal pain: Secondary | ICD-10-CM | POA: Diagnosis not present

## 2014-12-24 DIAGNOSIS — Z954 Presence of other heart-valve replacement: Secondary | ICD-10-CM

## 2014-12-24 DIAGNOSIS — Z853 Personal history of malignant neoplasm of breast: Secondary | ICD-10-CM | POA: Insufficient documentation

## 2014-12-24 DIAGNOSIS — I639 Cerebral infarction, unspecified: Secondary | ICD-10-CM

## 2014-12-24 DIAGNOSIS — E039 Hypothyroidism, unspecified: Secondary | ICD-10-CM | POA: Diagnosis not present

## 2014-12-24 DIAGNOSIS — I10 Essential (primary) hypertension: Secondary | ICD-10-CM | POA: Diagnosis not present

## 2014-12-24 DIAGNOSIS — R42 Dizziness and giddiness: Secondary | ICD-10-CM | POA: Insufficient documentation

## 2014-12-24 DIAGNOSIS — I6330 Cerebral infarction due to thrombosis of unspecified cerebral artery: Secondary | ICD-10-CM | POA: Insufficient documentation

## 2014-12-24 DIAGNOSIS — Z79899 Other long term (current) drug therapy: Secondary | ICD-10-CM | POA: Insufficient documentation

## 2014-12-24 DIAGNOSIS — E780 Pure hypercholesterolemia: Secondary | ICD-10-CM | POA: Diagnosis not present

## 2014-12-24 DIAGNOSIS — S301XXA Contusion of abdominal wall, initial encounter: Secondary | ICD-10-CM | POA: Diagnosis present

## 2014-12-24 DIAGNOSIS — E785 Hyperlipidemia, unspecified: Secondary | ICD-10-CM | POA: Diagnosis present

## 2014-12-24 DIAGNOSIS — R471 Dysarthria and anarthria: Secondary | ICD-10-CM | POA: Insufficient documentation

## 2014-12-24 HISTORY — DX: Contusion of abdominal wall, initial encounter: S30.1XXA

## 2014-12-24 HISTORY — DX: Transient cerebral ischemic attack, unspecified: G45.9

## 2014-12-24 LAB — COMPREHENSIVE METABOLIC PANEL
ALT: 28 U/L (ref 14–54)
AST: 46 U/L — AB (ref 15–41)
Albumin: 3.3 g/dL — ABNORMAL LOW (ref 3.5–5.0)
Alkaline Phosphatase: 65 U/L (ref 38–126)
Anion gap: 9 (ref 5–15)
BILIRUBIN TOTAL: 1.3 mg/dL — AB (ref 0.3–1.2)
BUN: 22 mg/dL — AB (ref 6–20)
CO2: 27 mmol/L (ref 22–32)
CREATININE: 0.79 mg/dL (ref 0.44–1.00)
Calcium: 9.8 mg/dL (ref 8.9–10.3)
Chloride: 99 mmol/L — ABNORMAL LOW (ref 101–111)
Glucose, Bld: 132 mg/dL — ABNORMAL HIGH (ref 65–99)
POTASSIUM: 4.4 mmol/L (ref 3.5–5.1)
Sodium: 135 mmol/L (ref 135–145)
TOTAL PROTEIN: 5.9 g/dL — AB (ref 6.5–8.1)

## 2014-12-24 LAB — CBC
HEMATOCRIT: 32.8 % — AB (ref 36.0–46.0)
HEMOGLOBIN: 10.8 g/dL — AB (ref 12.0–15.0)
MCH: 31.8 pg (ref 26.0–34.0)
MCHC: 32.9 g/dL (ref 30.0–36.0)
MCV: 96.5 fL (ref 78.0–100.0)
Platelets: 198 10*3/uL (ref 150–400)
RBC: 3.4 MIL/uL — ABNORMAL LOW (ref 3.87–5.11)
RDW: 19.3 % — AB (ref 11.5–15.5)
WBC: 6.9 10*3/uL (ref 4.0–10.5)

## 2014-12-24 LAB — PROTIME-INR
INR: 0.95 (ref 0.00–1.49)
Prothrombin Time: 12.9 seconds (ref 11.6–15.2)

## 2014-12-24 LAB — I-STAT CHEM 8, ED
BUN: 33 mg/dL — ABNORMAL HIGH (ref 6–20)
CREATININE: 0.9 mg/dL (ref 0.44–1.00)
Calcium, Ion: 1.24 mmol/L (ref 1.13–1.30)
Chloride: 97 mmol/L — ABNORMAL LOW (ref 101–111)
GLUCOSE: 131 mg/dL — AB (ref 65–99)
HEMATOCRIT: 34 % — AB (ref 36.0–46.0)
HEMOGLOBIN: 11.6 g/dL — AB (ref 12.0–15.0)
Potassium: 4.2 mmol/L (ref 3.5–5.1)
Sodium: 133 mmol/L — ABNORMAL LOW (ref 135–145)
TCO2: 27 mmol/L (ref 0–100)

## 2014-12-24 LAB — DIFFERENTIAL
BASOS ABS: 0 10*3/uL (ref 0.0–0.1)
Basophils Relative: 0 %
EOS ABS: 0.1 10*3/uL (ref 0.0–0.7)
Eosinophils Relative: 2 %
LYMPHS ABS: 0.8 10*3/uL (ref 0.7–4.0)
Lymphocytes Relative: 11 %
MONO ABS: 0.6 10*3/uL (ref 0.1–1.0)
MONOS PCT: 9 %
Neutro Abs: 5.4 10*3/uL (ref 1.7–7.7)
Neutrophils Relative %: 78 %

## 2014-12-24 LAB — APTT: APTT: 44 s — AB (ref 24–37)

## 2014-12-24 LAB — TROPONIN I: Troponin I: <0.03 ng/mL (ref <0.031)

## 2014-12-24 LAB — I-STAT TROPONIN, ED: TROPONIN I, POC: 0.02 ng/mL (ref 0.00–0.08)

## 2014-12-24 NOTE — ED Provider Notes (Signed)
CSN: [id number]     Arrival date & time 12/24/14  2147 History  By signing my name below, I, Deborah [name], attest that this documentation has been prepared under the direction and in the presence of Deborah Schmidt, MD. Electronically Signed: [name], Education administrator. 12/24/2014. 11:12 PM.    Chief Complaint  Patient presents with  . Dizziness  . Shortness of Breath  . Abdominal Pain   The history is provided by the patient. No language interpreter was used.    HPI Comments: [name] is a 81 y.o. female with past medical history of Afib, HTN brought in by ambulance, who presents to the Emergency Department complaining of a few seconds of increased saliva production and abnormal speech pattern that occurred about 3 hours ago at home which has since resolved. Patient states she knew what she intended to say but a family member was not able to understand her speech. She believes she was having a stroke but she denies prior history of TIA or CVA. She reports associated fatigue and mild SOB today along with continued abdominal cramping, bloating, and distention which has been ongoing for 1 week. She was seen in the ED 1 week ago and was hospitalized for a rectus sheath hematoma. She denies extremity numbness or weakness, CP.   Past Medical History  Diagnosis Date  . Hypothyroidism   . Stomach problems     seeing Dr Watt Climes  . Hypertension   . GERD (gastroesophageal reflux disease)   . Anemia   . Arrhythmia     Atrial Fibrillation  . Breast cancer     "right; S/P lumpectomy, chemo, XRT"   . Complication of anesthesia     "once I'm awakened, I don't sleep til the following night"  . High cholesterol   . Heart murmur   . History of blood transfusion 1996; ~ 2013    "related to valve replacement; GI bleeding"   . Chronic lower GI bleeding   . Arthritis     "fingers, toes, hips; qwhere" (05/19/2013)  . Gout   . Pneumonia 07/2011   Past Surgical History  Procedure Laterality Date  .  Arteriogram  01/12    NO RAS   . Cardioversion      X 2  . Cardiac catheterization    . Cataract extraction w/ intraocular lens  implant, bilateral Bilateral     bilateral cataract with lens implants  . Cholecystectomy    . Abdominal hysterectomy    . Appendectomy    . Colonoscopy with propofol N/A 11/24/2012    Procedure: COLONOSCOPY WITH PROPOFOL;  Surgeon: Jeryl Columbia, MD;  Location: [facility];  Service: Endoscopy;  Laterality: N/A;  . Breast biopsy Right   . Breast lumpectomy Right   . Aortic valve replacement  1996    [employer]  . Cardiac valve replacement  1996   Family History  Problem Relation Age of Onset  . Dementia Mother   . Coronary artery disease Father    Social History  Substance Use Topics  . Smoking status: Former Smoker -- 1.00 packs/day for 20 years    Types: Cigarettes    Quit date: 04/01/1988  . Smokeless tobacco: Never Used  . Alcohol Use: 4.8 oz/week    4 Glasses of wine, 4 Shots of liquor per week     Comment: burbon or glass wine daily   OB History    No data available     Review of Systems  Constitutional: Positive  for fatigue.  HENT: Positive for drooling (increased saliva production).   Respiratory: Positive for shortness of breath (mild).   Cardiovascular: Negative for chest pain.  Gastrointestinal: Positive for abdominal pain (cramping) and abdominal distention.  Neurological: Positive for speech difficulty (garbled speech). Negative for weakness and numbness.  All other systems reviewed and are negative.     Allergies  Amiodarone; Alendronate sodium; Macrodantin; Meclizine; Norvasc; and Tussionex pennkinetic er  Home Medications   Prior to Admission medications   Medication Sig Start Date End Date Taking? Authorizing Faige Seely  acetaminophen (TYLENOL) 500 MG tablet Take 1,000 mg by mouth every 6 (six) hours as needed for headache.    Historical Deborah Kataoka, MD  albuterol (PROVENTIL HFA;VENTOLIN HFA) 108 (90 BASE) MCG/ACT inhaler  Inhale 1-2 puffs into the lungs every 6 (six) hours as needed for wheezing or shortness of breath. 09/15/14   Deborah Borders, NP  allopurinol (ZYLOPRIM) 300 MG tablet TAKE ONE TABLET EACH DAY 11/22/14   Deborah Coco, MD  amLODipine (NORVASC) 5 MG tablet Take 1 tablet (5 mg total) by mouth 2 (two) times daily. 08/02/14   Deborah Coco, MD  calcium-vitamin D 500 MG tablet Take 1 tablet by mouth 2 (two) times daily.      Historical Patton Rabinovich, MD  dicyclomine (BENTYL) 10 MG capsule Take 10 mg by mouth daily as needed for spasms.     Historical Deborah Bartoli, MD  [place] 125 MCG tablet TAKE ONE TABLET EACH DAY Patient taking differently: TAKE ONE TABLET EACH DAY BY MOUTH 10/31/14   Deborah Coco, MD  fexofenadine [facility] ALLERGY) 180 MG tablet Take 180 mg by mouth daily as needed for allergies.     Historical Deborah Philippi, MD  furosemide (LASIX) 40 MG tablet Take 40 mg by mouth daily as needed (for swelling).    Historical Deborah Caporale, MD  hydrochlorothiazide (MICROZIDE) 12.5 MG capsule Take 1 capsule (12.5 mg total) by mouth 2 (two) times daily. 09/21/14   Deborah Coco, MD  hydrOXYzine (VISTARIL) 25 MG capsule Take 1 capsule (25 mg total) by mouth 3 (three) times daily as needed. 12/22/14   Deborah Cruel, MD  levothyroxine (SYNTHROID, LEVOTHROID) 88 MCG tablet Take 88 mcg by mouth daily before breakfast.    Historical Deborah Rhodes, MD  lidocaine-prilocaine (EMLA) cream Apply 1 application topically as needed (prior to infusion at porta cath site). 09/01/14   Deborah Cruel, MD  losartan (COZAAR) 50 MG tablet Take 1 tablet (50 mg total) by mouth 2 (two) times daily. 07/29/14   Deborah Coco, MD  metoprolol tartrate (LOPRESSOR) 25 MG tablet Take 12.5 mg by mouth 2 (two) times daily. 1/2 twice a day    Historical Deborah Fare, MD  multivitamin [facility] 2) per tablet Take 1 tablet by mouth daily.     Historical TRUE Shackleford, MD  NITROSTAT 0.4 MG SL tablet   09/12/14   Historical Deborah Zink, MD  Omega-3 Fatty Acids (FISH  OIL) 1000 MG CAPS Take 1,000 mg by mouth 2 (two) times daily.     Historical Deborah Mandella, MD  potassium chloride SA (K-DUR,KLOR-CON) 20 MEQ tablet Take 20 mEq by mouth daily.    Historical Roan Miklos, MD  simvastatin (ZOCOR) 20 MG tablet Take 10 mg by mouth daily.    Historical Terriah Reggio, MD  warfarin (COUMADIN) 5 MG tablet Take 1 tablet (5 mg total) by mouth daily. 12/28/14   Debbe Odea, MD  zolpidem (AMBIEN CR) 12.5 MG CR tablet TAKE ONE TABLET AT BEDTIME AS NEEDED Patient taking differently: TAKE ONE TABLET AT BEDTIME AS  NEEDED FOR SLEEP 08/07/14   Deborah Coco, MD   BP 172/61 mmHg  Pulse 77  Temp(Src) 97.9 F (36.6 C) (Oral)  Resp 15  Ht 5' (1.524 m)  Wt 129 lb (58.514 kg)  BMI 25.19 kg/m2  SpO2 97% Physical Exam  Constitutional: She is oriented to person, place, and time. She appears well-developed and well-nourished. No distress.  HENT:  Head: Normocephalic and atraumatic.  Eyes: EOM are normal. Pupils are equal, round, and reactive to light.  Neck: Normal range of motion.  Cardiovascular: Normal rate and normal heart sounds.  An irregularly irregular rhythm present.  Pulmonary/Chest: Effort normal and breath sounds normal.  Abdominal: Soft. She exhibits no distension. There is no tenderness.  Age-appropriate bruising over abdomen and bilateral flanks  Musculoskeletal: Normal range of motion.  Neurological: She is alert and oriented to person, place, and time.  5/5 strength in major muscle groups of  bilateral upper and lower extremities. Speech normal. No facial asymetry.   Skin: Skin is warm and dry.  Psychiatric: She has a normal mood and affect. Judgment normal.  Nursing note and vitals reviewed.   ED Course  Procedures (including critical care time)  DIAGNOSTIC STUDIES: Oxygen Saturation is 97% on RA, adequate by my interpretation.    COORDINATION OF CARE: 11:27 PM Will order lab work and head CT. Discussed treatment plan with pt at bedside and pt agreed to plan.    Labs Review Labs Reviewed  APTT - Abnormal; Notable for the following:    aPTT 44 (*)    All other components within normal limits  CBC - Abnormal; Notable for the following:    RBC 3.40 (*)    Hemoglobin 10.8 (*)    HCT 32.8 (*)    RDW 19.3 (*)    All other components within normal limits  COMPREHENSIVE METABOLIC PANEL - Abnormal; Notable for the following:    Chloride 99 (*)    Glucose, Bld 132 (*)    BUN 22 (*)    Total Protein 5.9 (*)    Albumin 3.3 (*)    AST 46 (*)    Total Bilirubin 1.3 (*)    All other components within normal limits  I-STAT CHEM 8, ED - Abnormal; Notable for the following:    Sodium 133 (*)    Chloride 97 (*)    BUN 33 (*)    Glucose, Bld 131 (*)    Hemoglobin 11.6 (*)    HCT 34.0 (*)    All other components within normal limits  PROTIME-INR  DIFFERENTIAL  TROPONIN I  I-STAT TROPOININ, ED  CBG MONITORING, ED    Imaging Review Dg Chest 2 View  12/25/2014   CLINICAL DATA:  Shortness of breath and anemia. Released from hospital on 12/22/2014.  EXAM: CHEST  2 VIEW  COMPARISON:  05/22/2012  FINDINGS: Postoperative changes in the mediastinum and right axilla. 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 No pneumothorax. Diffuse cardiac enlargement. No pulmonary vascular congestion. Interstitial changes in the lungs likely represent fibrosis. No focal consolidation or airspace disease in the lungs. No blunting of costophrenic angles. No pneumothorax. Calcified and tortuous aorta.  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 Cardiac enlargement. Interstitial fibrosis in the lungs. No evidence of active pulmonary disease.   Electronically Signed   By: Oren Beckmann.D.  On: 12/25/2014 00:13   Ct Head Wo Contrast  12/25/2014   CLINICAL DATA:  Stroke-like symptoms.  Drooling and slurred speech at home. Discharged home on Thursday after GI bleeding. Blood thinner stopped Saturday. Dizzy feeling  EXAM: CT HEAD WITHOUT CONTRAST  TECHNIQUE: Contiguous axial images were obtained from the base of the skull through the vertex without intravenous contrast.  COMPARISON:  03/16/2012  FINDINGS: Diffuse cerebral atrophy. Mild ventricular dilatation consistent with central atrophy. Low-attenuation changes in the deep white matter consistent with small vessel ischemia. No mass effect or midline shift. No abnormal extra-axial fluid collections. Gray-white matter junctions are distinct. Basal cisterns are not effaced. No evidence of acute intracranial hemorrhage. No depressed skull fractures. Opacification of the right sphenoid sinus. Mastoid air cells are not opacified. Vascular calcifications.  IMPRESSION: No acute intracranial abnormalities. Diffuse chronic atrophy and small vessel ischemic changes.   Electronically Signed   By: Lucienne Capers M.D.   On: 12/25/2014 00:47   I have personally reviewed and evaluated these images and lab results as part of my medical decision-making.   EKG Interpretation   Date/Time:  Saturday December 24 2014 22:02:01 EDT Ventricular Rate:  84 PR Interval:    QRS Duration: 93 QT Interval:  326 QTC Calculation: 385 R Axis:   64 Text Interpretation:  Atrial fibrillation Probable anterior infarct, age  indeterminate Repol abnrm, severe global ischemia (LM/MVD) No significant  change was found Confirmed by CAMPOS  MD, KEVIN ([id number]) on 12/24/2014  11:25:59 PM      MDM   Final diagnoses:  None    Recently taken off of her anticoagulation for significant rectus sheath hematoma.  She now has had transient dysarthria.  This could represent TIA.  This may represent some sort of hypersecretion of her salivary duct resulting in excessive salivation.  Her symptoms whatever they were seem transient.  She now has normal neurologic exam.  She'll  be admitted the hospital for a short overnight.  Head CT negative.  Patient remains in chronic A. fib.  I personally performed the services described in this documentation, which was scribed in my presence. The recorded information has been reviewed and is accurate.      Deborah Schmidt, MD 12/25/14 [phone]

## 2014-12-24 NOTE — ED Notes (Signed)
Pt brought to ED by [facility] after having some drooling and slurred speech at home, pt was dc home on Thursday after having some GI bleed. Blood thinner stopped last Saturday after the bleeding. Pt having some discomfort on her abd and is bruise all over from abd down to her legs, pt states she still feeling dizzy, AO x 4 no neurological symptoms on arrival.

## 2014-12-25 ENCOUNTER — Observation Stay (HOSPITAL_BASED_OUTPATIENT_CLINIC_OR_DEPARTMENT_OTHER): Payer: Medicare Other

## 2014-12-25 DIAGNOSIS — E785 Hyperlipidemia, unspecified: Secondary | ICD-10-CM | POA: Diagnosis present

## 2014-12-25 DIAGNOSIS — M109 Gout, unspecified: Secondary | ICD-10-CM | POA: Diagnosis present

## 2014-12-25 DIAGNOSIS — E039 Hypothyroidism, unspecified: Secondary | ICD-10-CM

## 2014-12-25 DIAGNOSIS — G459 Transient cerebral ischemic attack, unspecified: Secondary | ICD-10-CM | POA: Diagnosis not present

## 2014-12-25 DIAGNOSIS — I10 Essential (primary) hypertension: Secondary | ICD-10-CM | POA: Diagnosis not present

## 2014-12-25 DIAGNOSIS — I481 Persistent atrial fibrillation: Secondary | ICD-10-CM | POA: Diagnosis not present

## 2014-12-25 DIAGNOSIS — R471 Dysarthria and anarthria: Secondary | ICD-10-CM | POA: Diagnosis not present

## 2014-12-25 DIAGNOSIS — R4781 Slurred speech: Secondary | ICD-10-CM

## 2014-12-25 DIAGNOSIS — I639 Cerebral infarction, unspecified: Secondary | ICD-10-CM | POA: Diagnosis not present

## 2014-12-25 DIAGNOSIS — S301XXD Contusion of abdominal wall, subsequent encounter: Secondary | ICD-10-CM

## 2014-12-25 DIAGNOSIS — K219 Gastro-esophageal reflux disease without esophagitis: Secondary | ICD-10-CM

## 2014-12-25 LAB — DIGOXIN LEVEL: Digoxin Level: 0.2 ng/mL — ABNORMAL LOW (ref 0.8–2.0)

## 2014-12-25 LAB — CBG MONITORING, ED: Glucose-Capillary: 130 mg/dL — ABNORMAL HIGH (ref 65–99)

## 2014-12-25 LAB — BRAIN NATRIURETIC PEPTIDE: B NATRIURETIC PEPTIDE 5: 523.8 pg/mL — AB (ref 0.0–100.0)

## 2014-12-25 MED ORDER — DICYCLOMINE HCL 10 MG PO CAPS
10.0000 mg | ORAL_CAPSULE | Freq: Every day | ORAL | Status: DC | PRN
  Filled 2014-12-25: qty 1

## 2014-12-25 MED ORDER — DIGOXIN 125 MCG PO TABS
0.1250 mg | ORAL_TABLET | Freq: Every day | ORAL | Status: DC
  Administered 2014-12-25 – 2014-12-26 (×2): 0.125 mg via ORAL
  Filled 2014-12-25 (×2): qty 1

## 2014-12-25 MED ORDER — STROKE: EARLY STAGES OF RECOVERY BOOK
Freq: Once | Status: AC
  Administered 2014-12-25: 10:00:00
  Filled 2014-12-25: qty 1

## 2014-12-25 MED ORDER — ADULT MULTIVITAMIN W/MINERALS CH
1.0000 | ORAL_TABLET | Freq: Every day | ORAL | Status: DC
  Administered 2014-12-26: 1 via ORAL
  Filled 2014-12-25 (×2): qty 1

## 2014-12-25 MED ORDER — ZOLPIDEM TARTRATE 5 MG PO TABS
5.0000 mg | ORAL_TABLET | Freq: Every evening | ORAL | Status: DC | PRN
  Administered 2014-12-25: 5 mg via ORAL
  Filled 2014-12-25: qty 1

## 2014-12-25 MED ORDER — LOSARTAN POTASSIUM 50 MG PO TABS
50.0000 mg | ORAL_TABLET | Freq: Two times a day (BID) | ORAL | Status: DC
  Administered 2014-12-25 – 2014-12-26 (×3): 50 mg via ORAL
  Filled 2014-12-25 (×5): qty 1

## 2014-12-25 MED ORDER — HYDROXYZINE HCL 25 MG PO TABS
25.0000 mg | ORAL_TABLET | Freq: Three times a day (TID) | ORAL | Status: DC | PRN
  Administered 2014-12-25 – 2014-12-26 (×4): 25 mg via ORAL
  Filled 2014-12-25 (×4): qty 1

## 2014-12-25 MED ORDER — AMLODIPINE BESYLATE 10 MG PO TABS
10.0000 mg | ORAL_TABLET | Freq: Every evening | ORAL | Status: DC
  Administered 2014-12-25 – 2014-12-26 (×2): 10 mg via ORAL
  Filled 2014-12-25 (×2): qty 1

## 2014-12-25 MED ORDER — METOPROLOL TARTRATE 12.5 MG HALF TABLET
12.5000 mg | ORAL_TABLET | Freq: Two times a day (BID) | ORAL | Status: DC
  Administered 2014-12-25 – 2014-12-26 (×3): 12.5 mg via ORAL
  Filled 2014-12-25 (×3): qty 1

## 2014-12-25 MED ORDER — ALBUTEROL SULFATE (2.5 MG/3ML) 0.083% IN NEBU: 2.5000 mg | INHALATION_SOLUTION | Freq: Four times a day (QID) | RESPIRATORY_TRACT | Status: DC | PRN

## 2014-12-25 MED ORDER — NITROGLYCERIN 0.4 MG SL SUBL: 0.4000 mg | SUBLINGUAL_TABLET | SUBLINGUAL | Status: DC | PRN

## 2014-12-25 MED ORDER — ACETAMINOPHEN 325 MG PO TABS: 650.0000 mg | ORAL_TABLET | Freq: Four times a day (QID) | ORAL | Status: DC | PRN

## 2014-12-25 MED ORDER — STROKE: EARLY STAGES OF RECOVERY BOOK
Freq: Once | Status: DC
  Filled 2014-12-25: qty 1

## 2014-12-25 MED ORDER — LIDOCAINE-PRILOCAINE 2.5-2.5 % EX CREA
1.0000 "application " | TOPICAL_CREAM | CUTANEOUS | Status: DC | PRN
  Filled 2014-12-25: qty 5

## 2014-12-25 MED ORDER — LORATADINE 10 MG PO TABS
10.0000 mg | ORAL_TABLET | Freq: Every day | ORAL | Status: DC
  Administered 2014-12-25 – 2014-12-26 (×2): 10 mg via ORAL
  Filled 2014-12-25 (×2): qty 1

## 2014-12-25 MED ORDER — HYDROXYZINE PAMOATE 25 MG PO CAPS
25.0000 mg | ORAL_CAPSULE | Freq: Three times a day (TID) | ORAL | Status: DC | PRN
  Administered 2014-12-25: 25 mg via ORAL
  Filled 2014-12-25 (×2): qty 1

## 2014-12-25 MED ORDER — CALCIUM CARBONATE-VITAMIN D 500-200 MG-UNIT PO TABS
1.0000 | ORAL_TABLET | Freq: Two times a day (BID) | ORAL | Status: DC
  Administered 2014-12-25 – 2014-12-26 (×4): 1 via ORAL
  Filled 2014-12-25 (×7): qty 1

## 2014-12-25 MED ORDER — OMEGA-3-ACID ETHYL ESTERS 1 G PO CAPS
1.0000 g | ORAL_CAPSULE | Freq: Every day | ORAL | Status: DC
  Administered 2014-12-25 – 2014-12-26 (×2): 1 g via ORAL
  Filled 2014-12-25 (×3): qty 1

## 2014-12-25 MED ORDER — ALBUTEROL SULFATE HFA 108 (90 BASE) MCG/ACT IN AERS: 1.0000 | INHALATION_SPRAY | Freq: Four times a day (QID) | RESPIRATORY_TRACT | Status: DC | PRN

## 2014-12-25 MED ORDER — ASPIRIN 81 MG PO CHEW
81.0000 mg | CHEWABLE_TABLET | Freq: Every day | ORAL | Status: DC
  Administered 2014-12-25 – 2014-12-26 (×2): 81 mg via ORAL
  Filled 2014-12-25 (×2): qty 1

## 2014-12-25 MED ORDER — CALCIUM-VITAMIN D 500 MG PO TABS: 500.0000 mg | ORAL_TABLET | Freq: Two times a day (BID) | ORAL | Status: DC

## 2014-12-25 MED ORDER — SIMVASTATIN 5 MG PO TABS
10.0000 mg | ORAL_TABLET | Freq: Every day | ORAL | Status: DC
  Administered 2014-12-25 – 2014-12-26 (×2): 10 mg via ORAL
  Filled 2014-12-25 (×2): qty 2
  Filled 2014-12-25: qty 1

## 2014-12-25 MED ORDER — HYDROCHLOROTHIAZIDE 12.5 MG PO CAPS
12.5000 mg | ORAL_CAPSULE | Freq: Two times a day (BID) | ORAL | Status: DC
  Administered 2014-12-25 – 2014-12-26 (×3): 12.5 mg via ORAL
  Filled 2014-12-25 (×3): qty 1

## 2014-12-25 MED ORDER — LEVOTHYROXINE SODIUM 88 MCG PO TABS
88.0000 ug | ORAL_TABLET | Freq: Every day | ORAL | Status: DC
  Administered 2014-12-26: 88 ug via ORAL
  Filled 2014-12-25 (×4): qty 1

## 2014-12-25 MED ORDER — MULTIVITAMINS PO TABS: 1.0000 | ORAL_TABLET | Freq: Every day | ORAL | Status: DC

## 2014-12-25 MED ORDER — ZOLPIDEM TARTRATE 5 MG PO TABS: 10.0000 mg | ORAL_TABLET | Freq: Every evening | ORAL | Status: DC | PRN

## 2014-12-25 NOTE — Progress Notes (Signed)
Receiving RN in report for shift change at this time.

## 2014-12-25 NOTE — Progress Notes (Signed)
*  PRELIMINARY RESULTS* Vascular Ultrasound Carotid Duplex (Doppler) has been completed.  Preliminary findings: Bilateral: No significant (1-39%) ICA stenosis. Antegrade vertebral flow.    Landry Mellow, RDMS, RVT  12/25/2014, 12:21 PM

## 2014-12-25 NOTE — Progress Notes (Addendum)
Patient Demographics:    Deborah [name], is a 81 y.o. female, DOB - Apr 02, 1926, MVH:[id number]  Admit date - 12/24/2014   Admitting Physician Ivor Costa, MD  Outpatient Primary MD for the patient is [name], MD  LOS -    Chief Complaint  Patient presents with  . Dizziness  . Shortness of Breath  . Abdominal Pain        Subjective:    [name] today has, No headache, No chest pain, No abdominal pain - No Nausea, No new weakness tingling or numbness, No Cough - SOB.     Assessment  & Plan :     1. Dysarthria. In a patient with a 75 year old [employer] prosthetic aortic valve, chronic atrial fibrillation, who had issues with GI bleed in the past and now recently large rectus sheath bleed few days ago. Symptoms have completely resolved, no focal deficits or dysarthria, no dysphagia. CT head unremarkable. MRI . Full stroke protocol initiated. Neurology consulted. Risks and benefits discussed with patient in detail for now 81 mg of aspirin and statin for secondary prevention.   2. Chronic atrial fibrillation with prosthetic aortic valve, [place] Vasc score greater than 5. Discussed with Dr. Debara Pickett cardiologist, aspirin for now is the only option, he has nothing to add further. Due to resume Coumadin on 29th of this month. Continue statin for secondary prevention. Continue digoxin and Beta blocker  for rate control.   3. Dyslipidemia. Continue home dose statin   4. Essential hypertension. On HCTZ, ARB, Norvasc along with beta blocker continue.   5. Hypothyroidism. Continue Synthroid.     Code Status : Full  Family Communication  : none present  Disposition Plan  : home 1- 2 days  Consults  :  Discussed with Dr. Debara Pickett cardiologist, neurologist  Procedures  :   Echogram, carotid  duplex  DVT Prophylaxis  :  SCDs    Lab Results  Component Value Date   PLT 198 12/24/2014    Inpatient Medications  Scheduled Meds: .  stroke: mapping our early stages of recovery book   Does not apply Once  . amLODipine  10 mg Oral QPM  . aspirin  81 mg Oral Daily  . calcium-vitamin D  1 tablet Oral BID WC  . digoxin  0.125 mg Oral Daily  . hydrochlorothiazide  12.5 mg Oral BID  . levothyroxine  88 mcg Oral QAC breakfast  . loratadine  10 mg Oral Daily  . losartan  50 mg Oral BID  . metoprolol tartrate  12.5 mg Oral BID  . multivitamin with minerals  1 tablet Oral Daily  . omega-3 acid ethyl esters  1 g Oral Daily  . simvastatin  10 mg Oral q1800   Continuous Infusions:  PRN Meds:.acetaminophen, albuterol, dicyclomine, hydrOXYzine, lidocaine-prilocaine, nitroGLYCERIN, zolpidem  Antibiotics  :     Anti-infectives    None        Objective:   Filed Vitals:   12/25/14 0645 12/25/14 0700 12/25/14 0800 12/25/14 1000  BP: 121/56 118/54 174/70 161/72  Pulse: 63 72 78 68  Temp:   98.2 F (36.8 C) 98.8 F (37.1 C)  TempSrc:   Oral Oral  Resp: '21 24 16 16  '$ Height:  Weight:      SpO2: 92% 94% 97% 98%    Wt Readings from Last 3 Encounters:  12/24/14 58.514 kg (129 lb)  12/22/14 58.877 kg (129 lb 12.8 oz)  11/22/14 56.427 kg (124 lb 6.4 oz)    No intake or output data in the 24 hours ending 12/25/14 1148   Physical Exam  Awake Alert, Oriented X 3, No new F.N deficits, Normal affect [place].AT,PERRAL Supple Neck,No JVD, No cervical lymphadenopathy appriciated.  Symmetrical Chest wall movement, Good air movement bilaterally, CTAB RRR,No Gallops,Rubs or new Murmurs, No Parasternal Heave +ve B.Sounds, Abd Soft, No tenderness, No organomegaly appriciated, No rebound - guarding or rigidity. No Cyanosis, Clubbing or edema, No new Rash , extensive abdominal wall and bilateral thigh bruising    Data Review:   Micro Results Recent Results (from the past 240  hour(s))  MRSA PCR Screening     Status: None   Collection Time: 12/19/14  6:37 PM  Result Value Ref Range Status   MRSA by PCR NEGATIVE NEGATIVE Final    Comment:        The GeneXpert MRSA Assay (FDA approved for NASAL specimens only), is one component of a comprehensive MRSA colonization surveillance program. It is not intended to diagnose MRSA infection nor to guide or monitor treatment for MRSA infections.     Radiology Reports Ct Abdomen Pelvis Wo Contrast  12/19/2014   CLINICAL DATA:  Left abdominal pain since Saturday. Pain is getting progressively worse. Patient is on blood thinners.  EXAM: CT ABDOMEN AND PELVIS WITHOUT CONTRAST  TECHNIQUE: Multidetector CT imaging of the abdomen and pelvis was performed following the standard protocol without IV contrast.  COMPARISON:  12/31/2011 and 11/08/2014  FINDINGS: Lung bases are clear. Mild enlargement of the heart appears stable. There is no evidence for free intraperitoneal air.  There is a large heterogeneous structure involving the left rectus sheath compatible with a hematoma. This hematoma measures 18.6 x 11.0 x 15.7 cm. Active bleeding cannot be evaluated on this non contrast examination. There is subcutaneous edema and stranding in the left anterior abdomen adjacent to the hematoma.  Again noted are innumerable renal cysts. One of the largest cysts measures up to 6.1 cm in the left kidney. There is compression on the left kidney and left abdominal structures from the large rectus sheath hematoma.  Again noted is a 1.8 cm low-density structure in left hepatic lobe which probably represents a hepatic cyst. The gallbladder has been removed. No gross abnormality to the spleen or pancreas.  Atherosclerotic calcifications in the aorta and visceral arteries without aneurysm. There is subcutaneous edema extending into the lower anterior abdominal and pelvic subcutaneous tissues. Fluid in the urinary bladder. Uterus is surgically absent. Stable  nodular structures near the vaginal cuff. No acute abnormality or dilatation to the small or large bowel. Stable mild fullness in the left adrenal gland. Normal appearance of the right adrenal gland.  There is severe disc space loss at L1-L2 with approximately 6 mm of retrolisthesis at L1-L2. Vacuum disc phenomena at T12-L1. There is extensive subchondral sclerosis and endplate changes at Q5-Z5. Disease at L1-L2 is similar to the exam on 11/08/2014.  IMPRESSION: Large left-sided rectus sheath hematoma, measuring up to 18.6 cm.  Innumerable bilateral renal cysts.  Severe disc space disease at L1-L2.  These results were called by telephone at the time of interpretation on 12/19/2014 at 2:20 pm to Dr. Daleen Bo , who verbally acknowledged these results.   Electronically Signed  By: Markus Daft M.D.   On: 12/19/2014 14:21   Dg Chest 2 View  12/25/2014   CLINICAL DATA:  Shortness of breath and anemia. Released from hospital on 12/22/2014.  EXAM: CHEST  2 VIEW  COMPARISON:  05/22/2012  FINDINGS: Postoperative changes in the mediastinum and right axilla. 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 No pneumothorax. Diffuse cardiac enlargement. No pulmonary vascular congestion. Interstitial changes in the lungs likely represent fibrosis. No focal consolidation or airspace disease in the lungs. No blunting of costophrenic angles. No pneumothorax. Calcified and tortuous aorta.  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 Cardiac enlargement. Interstitial fibrosis in the lungs. No evidence of active pulmonary disease.   Electronically Signed   By: Lucienne Capers M.D.   On: 12/25/2014 00:13   Ct Head Wo Contrast  12/25/2014   CLINICAL DATA:  Stroke-like symptoms. Drooling and slurred speech at home. Discharged home on Thursday after  GI bleeding. Blood thinner stopped Saturday. Dizzy feeling  EXAM: CT HEAD WITHOUT CONTRAST  TECHNIQUE: Contiguous axial images were obtained from the base of the skull through the vertex without intravenous contrast.  COMPARISON:  03/16/2012  FINDINGS: Diffuse cerebral atrophy. Mild ventricular dilatation consistent with central atrophy. Low-attenuation changes in the deep white matter consistent with small vessel ischemia. No mass effect or midline shift. No abnormal extra-axial fluid collections. Gray-white matter junctions are distinct. Basal cisterns are not effaced. No evidence of acute intracranial hemorrhage. No depressed skull fractures. Opacification of the right sphenoid sinus. Mastoid air cells are not opacified. Vascular calcifications.  IMPRESSION: No acute intracranial abnormalities. Diffuse chronic atrophy and small vessel ischemic changes.   Electronically Signed   By: Lucienne Capers M.D.   On: 12/25/2014 00:47   Ct Angio Abd/pel W/ And/or W/o  12/20/2014   CLINICAL DATA:  81 year old female with a history of mechanical heart valve on Lovenox injections for therapeutic anti coagulation, has developed rectus sheath hematoma. Initial CT is noncontrast, and the hematoma has grown over the course of 2-3 days. Current study has been performed to evaluate for ongoing bleeding, given the need for continued anti coagulation.  EXAM: CTA ABDOMEN AND PELVIS wITHOUT AND WITH CONTRAST  TECHNIQUE: Multidetector CT imaging of the abdomen and pelvis was performed using the standard protocol during bolus administration of intravenous contrast. Multiplanar reconstructed images and MIPs were obtained and reviewed to evaluate the vascular anatomy.  CONTRAST:  138m OMNIPAQUE IOHEXOL 350 MG/ML SOLN  COMPARISON:  Noncontrast CT 12/19/2014  FINDINGS: Lower chest:  Surgical changes of median sternotomy.  Right atrial enlargement.  No pericardial fluid/thickening.  No lower mediastinal adenopathy.  Unremarkable  appearance of the distal esophagus.  Small hiatal hernia.  Respiratory motion somewhat limits evaluation the lungs, though there is no large pleural effusion or confluent airspace disease.  Abdomen/pelvis:  Vascular:  Aorta:  Calcifications are present at the length of the abdominal aorta. No aneurysmal dilation. No dissection flap. Tortuosity descending thoracic aorta.  Mesenteric vessels:  Calcifications are present at the origin of the celiac artery, superior mesenteric artery, and the bilateral renal arteries. Calcifications and soft plaque present at the origin of the inferior mesenteric artery.  There may be 50% stenosis at the origin of the left renal artery, and there may be 50% stenosis at the origin of the inferior mesenteric artery.  Right lower extremity:  Calcifications present throughout the length of the right iliac system with tortuosity. Right hypogastric artery is patent. No aneurysm or dissection flap. Focal fusiform ectasia of the right common iliac artery measuring 12 mm.  Left lower extremity:  Atherosclerotic calcifications are present the length of the left iliac system. At the mid left common iliac artery there is circumferential soft plaquing calcification which may contribute to 50% stenosis of the common iliac artery. Hypogastric artery is either nearly occluded with reconstitution of flow or occluded. Fusiform at ectasia of the left common iliac artery measuring 11 mm.  Contained hematoma of the left-sided abdominal wall musculature. No evidence of active extravasation. Developing fluid fluid level with layering of the dense blood products at the posterior hematoma.  The axial, coronal, and sagittal images demonstrate slightly decreased diameters in all dimensions  Current: axial: 10.1 cm x 16.9 cm, coronal: 17.5 cm, sagittal 14.8 cm  Prior: Axial: 11.1 cm x 15.8 cm, coronal: 18.6 cm, sagittal: 16.3 cm  The overall volume is likely unchanged, with the size difference potentially  representing redistribution.  Nonvascular:  Hypodense rounded lesion within segment 4B, compatible with benign cysts. Otherwise unremarkable appearance of the liver.  Unremarkable spleen.  Unremarkable pancreas.  Unremarkable bilateral adrenal glands.  No abnormally distended small bowel or colon. No inflammatory changes of the mesenteric. Colonic diverticular disease.  Surgical changes of cholecystectomy.  Innumerable bilateral renal cystic lesions. The majority of these are compatible with Bosniak 1 cyst. Inferior left-sided lesion with high density/calcium within the cyst on the posterior cortex measures 3.4 cm and cannot be characterized as a Bosniak 1 cyst.  No evidence of left or right-sided hydronephrosis. No left or right-sided nephrolithiasis. No ureteral stone. Unremarkable appearance of the urinary bladder wall. Likely small cystocele.  Diffuse osteopenia. No displaced fracture identified. Multilevel degenerative changes of the visualized spine. Degenerative disc disease is worst at the L1-L2 level. No focal significant bony canal narrowing.  IMPRESSION: Contained hematoma of the left-sided abdominal wall musculature with no evidence of active extravasation. Size of the hematoma is unchanged from the comparison study.  Atherosclerosis of the abdominal aorta and bilateral iliac system.  Right atrial enlargement with changes of prior median sternotomy, compatible with the patient's history of prior valve surgery.  There may be 50% narrowing of the proximal left renal artery, as well as the mid left common iliac artery.  Innumerable bilateral renal cysts, all of which appear to be compatible with Bosniak 1 cysts and Bosniak 2 cysts.  Additional findings as above.  Signed,  Dulcy Fanny. Earleen Newport, DO  [facility]  [facility]   Electronically Signed   By: Corrie Mckusick D.O.   On: 12/20/2014 11:35     CBC  Recent Labs Lab 12/19/14 1120  12/20/14 2110  12/21/14 0355 12/21/14 1623 12/22/14 0440 12/23/14 1054 12/24/14 2200 12/24/14 2239  WBC 11.2*  < > 8.6 6.8  --  5.3 6.6 6.9  --   HGB 9.2*  < > 8.1* 9.6* 9.9* 9.3* 9.9* 10.8* 11.6*  HCT 26.8*  < > 24.0* 28.0* 29.0* 27.1* 29.8* 32.8* 34.0*  PLT 221  < > 151 146*  --  150 200 198  --   MCV 98.2  < > 97.6 91.8  --  93.4 96.1 96.5  --   MCH 33.7  < > 32.9 31.5  --  32.1 31.9 31.8  --   MCHC 34.3  < > 33.8 34.3  --  34.3 33.2 32.9  --  RDW 13.3  < > 15.8* 19.0*  --  19.6* 19.9* 19.3*  --   LYMPHSABS 0.5*  --   --   --   --   --  0.5* 0.8  --   MONOABS 0.5  --   --   --   --   --  0.6 0.6  --   EOSABS 0.0  --   --   --   --   --  0.0 0.1  --   BASOSABS 0.0  --   --   --   --   --  0.0 0.0  --   < > = values in this interval not displayed.  Chemistries   Recent Labs Lab 12/20/14 0300 12/21/14 1126 12/21/14 1238 12/22/14 0440 12/24/14 2200 12/24/14 2239  NA 123* 129* 130* 133* 135 133*  K 4.6 3.9 4.3 3.8 4.4 4.2  CL 90* 97* 97* 101 99* 97*  CO2 '27 28 28 28 27  '$ --   GLUCOSE 114* 112* 90 95 132* 131*  BUN 25* '16 15 11 '$ 22* 33*  CREATININE 1.32* 0.78 0.86 0.75 0.79 0.90  CALCIUM 9.0 8.9 8.9 8.6* 9.8  --   AST 29  --   --   --  46*  --   ALT 20  --   --   --  28  --   ALKPHOS 36*  --   --   --  65  --   BILITOT 0.3  --   --   --  1.3*  --    ------------------------------------------------------------------------------------------------------------------ estimated creatinine clearance is 36.6 mL/min (by C-G formula based on Cr of 0.9). ------------------------------------------------------------------------------------------------------------------ No results for input(s): HGBA1C in the last 72 hours. ------------------------------------------------------------------------------------------------------------------ No results for input(s): CHOL, HDL, LDLCALC, TRIG, CHOLHDL, LDLDIRECT in the last 72  hours. ------------------------------------------------------------------------------------------------------------------ No results for input(s): TSH, T4TOTAL, T3FREE, THYROIDAB in the last 72 hours.  Invalid input(s): FREET3 ------------------------------------------------------------------------------------------------------------------  Recent Labs  12/23/14 1054 12/23/14 1054  FERRITIN  --  719*  RETICCTPCT 7.30*  --     Coagulation profile  Recent Labs Lab 12/19/14 1120 12/24/14 2200  INR 1.04 0.95    No results for input(s): DDIMER in the last 72 hours.  Cardiac Enzymes  Recent Labs Lab 12/19/14 1840 12/24/14 2200  TROPONINI 0.03 <0.03   ------------------------------------------------------------------------------------------------------------------ Invalid input(s): POCBNP   Time Spent in minutes  35   SINGH,PRASHANT K M.D on 12/25/2014 at 11:48 AM  Between 7am to 7pm - Pager - [phone]  After 7pm go to [email or web address] - password [facility]  [facility] -  Office  [phone]

## 2014-12-25 NOTE — ED Notes (Signed)
Requesting vistaril at this time for sleep.

## 2014-12-25 NOTE — ED Notes (Signed)
Hospitalist at the bedside 

## 2014-12-25 NOTE — Consult Note (Signed)
Referring Physician: Dr Blaine Hamper    Chief Complaint: Slurred speech  HPI: Deborah [name] is an 81 y.o. female admitted to [facility] on 12/24/2014 with a history of hypertension, hyperlipidemia, history of breast cancer, and history of a GI bleed, who presented for evaluation of slurred speech. The patient had recently been in the hospital from 12/19/2014 to 12/22/2014 for evaluation of a rectal sheath hematoma that required transfusion. The patient has a mechanical aortic valve as well as atrial fibrillation and had been on Coumadin. The Coumadin was held secondary to bleeding problems and she was maintained on Lovenox. It was felt that the rectal sheath hematoma developed spontaneously while she was on the Lovenox.   The patient's daughter was with her at approximately 8 PM last night when she complained of inability to control to saliva in her mouth. She later had garbled speech and brief gibberish.Her speech difficulties had resolved by the time she was seen in emergency department.   Date last known well: Date: 12/24/2014 Time last known well: Time: 20:00 tPA Given: No: Deficits resolved  Past Medical History  Diagnosis Date  . Hypothyroidism   . Stomach problems     seeing Dr Watt Climes  . Hypertension   . GERD (gastroesophageal reflux disease)   . Anemia   . Arrhythmia     Atrial Fibrillation  . Breast cancer     "right; S/P lumpectomy, chemo, XRT"   . Complication of anesthesia     "once I'm awakened, I don't sleep til the following night"  . High cholesterol   . Heart murmur   . History of blood transfusion 1996; ~ 2013    "related to valve replacement; GI bleeding"   . Chronic lower GI bleeding   . Arthritis     "fingers, toes, hips; qwhere" (05/19/2013)  . Gout   . Pneumonia 07/2011    Past Surgical History  Procedure Laterality Date  . Arteriogram  01/12    NO RAS   . Cardioversion      X 2  . Cardiac catheterization    . Cataract extraction w/ intraocular lens   implant, bilateral Bilateral     bilateral cataract with lens implants  . Cholecystectomy    . Abdominal hysterectomy    . Appendectomy    . Colonoscopy with propofol N/A 11/24/2012    Procedure: COLONOSCOPY WITH PROPOFOL;  Surgeon: Jeryl Columbia, MD;  Location: [facility];  Service: Endoscopy;  Laterality: N/A;  . Breast biopsy Right   . Breast lumpectomy Right   . Aortic valve replacement  1996    [employer]  . Cardiac valve replacement  1996    Family History  Problem Relation Age of Onset  . Dementia Mother   . Coronary artery disease Father    Social History:  reports that she quit smoking about 26 years ago. Her smoking use included Cigarettes. She has a 20 pack-year smoking history. She has never used smokeless tobacco. She reports that she drinks about 4.8 oz of alcohol per week. She reports that she does not use illicit drugs.  Allergies:  Allergies  Allergen Reactions  . Amiodarone Shortness Of Breath  . Alendronate Sodium Other (See Comments)    GI upset  . Macrodantin [Nitrofurantoin] Other (See Comments)    GI upset  . Meclizine Swelling    tongue  . Norvasc [Amlodipine Besylate] Swelling    edema  . Tussionex Pennkinetic Er [Hydrocod Polst-Cpm Polst Er] Other (See Comments)  Reaction unknown    Medications:  Scheduled: .  stroke: mapping our early stages of recovery book   Does not apply Once  . amLODipine  10 mg Oral QPM  . aspirin  81 mg Oral Daily  . calcium-vitamin D  1 tablet Oral BID WC  . digoxin  0.125 mg Oral Daily  . hydrochlorothiazide  12.5 mg Oral BID  . levothyroxine  88 mcg Oral QAC breakfast  . loratadine  10 mg Oral Daily  . losartan  50 mg Oral BID  . metoprolol tartrate  12.5 mg Oral BID  . multivitamin with minerals  1 tablet Oral Daily  . omega-3 acid ethyl esters  1 g Oral Daily  . simvastatin  10 mg Oral q1800    ROS: History obtained from the patient  General ROS: negative for - chills, fatigue, fever, night sweats, weight  gain or weight loss. One recent episode of visual difficulties which lasted several minutes but then resolved and has not recurred. Psychological ROS: negative for - behavioral disorder, hallucinations, memory difficulties, mood swings or suicidal ideation Ophthalmic ROS: negative for - blurry vision, double vision, eye pain or loss of vision ENT ROS: negative for - epistaxis, nasal discharge, oral lesions, sore throat, tinnitus or vertigo. Recent seasonal allergies Allergy and Immunology ROS: negative for - hives or itchy/watery eyes Hematological and Lymphatic ROS: negative for - bleeding problems, bruising or swollen lymph nodes Endocrine ROS: negative for - galactorrhea, hair pattern changes, polydipsia/polyuria or temperature intolerance Respiratory ROS: negative for - cough, hemoptysis, shortness of breath or wheezing Cardiovascular ROS: negative for - chest pain, dyspnea on exertion, edema or irregular heartbeat Gastrointestinal ROS: negative for - abdominal pain, diarrhea, hematemesis, nausea/vomiting or stool incontinence. Abdominal fullness. Genito-Urinary ROS: negative for - dysuria, hematuria, incontinence or urinary frequency/urgency Musculoskeletal ROS: negative for - joint swelling or muscular weakness Neurological ROS: as noted in HPI Dermatological ROS: negative for rash and skin lesion changes   Physical Examination: Blood pressure 165/77, pulse 77, temperature 98.4 F (36.9 C), temperature source Oral, resp. rate 16, height 5' (1.524 m), weight 58.514 kg (129 lb), SpO2 100 %.  General - pleasant 81 year old alert female in no acute distress Heart - irregularly irregular rhythm with grade 2/6 systolic murmur and prosthetic heart valve sounds. Lungs - Clear to auscultation Abdomen - forearm - non tender Extremities - Distal pulses weak to absent- no edema Skin - Warm and dry  Mental Status: Alert, oriented, thought content appropriate.  Speech fluent without evidence of  aphasia.  Able to follow 3 step commands without difficulty. Cranial Nerves: II: Discs not visualized; Visual fields grossly normal, pupils equal, round, reactive to light and accommodation III,IV, VI: ptosis not present, extra-ocular motions intact bilaterally V,VII: smile symmetric, facial light touch sensation normal bilaterally VIII: hearing normal bilaterally IX,X: gag reflex present XI: bilateral shoulder shrug XII: midline tongue extension Motor: Right : Upper extremity   5/5    Left:     Upper extremity   5/5  Lower extremity   5/5     Lower extremity   5/5 Tone and bulk:normal tone throughout; no atrophy noted Sensory: Pinprick and light touch intact throughout, bilaterally Deep Tendon Reflexes: 2+ and symmetric throughout Plantars: Right: downgoing   Left: downgoing Cerebellar: normal finger-to-nose, normal rapid alternating movements and normal heel-to-shin test Gait: Ambulated from the bathroom without difficulty.    Laboratory Studies:  Basic Metabolic Panel:  Recent Labs Lab 12/20/14 0300 12/21/14 1126 12/21/14 1238 12/22/14 0440  12/24/14 2200 12/24/14 2239  NA 123* 129* 130* 133* 135 133*  K 4.6 3.9 4.3 3.8 4.4 4.2  CL 90* 97* 97* 101 99* 97*  CO2 '27 28 28 28 27  '$ --   GLUCOSE 114* 112* 90 95 132* 131*  BUN 25* '16 15 11 '$ 22* 33*  CREATININE 1.32* 0.78 0.86 0.75 0.79 0.90  CALCIUM 9.0 8.9 8.9 8.6* 9.8  --     Liver Function Tests:  Recent Labs Lab 12/20/14 0300 12/24/14 2200  AST 29 46*  ALT 20 28  ALKPHOS 36* 65  BILITOT 0.3 1.3*  PROT 4.7* 5.9*  ALBUMIN 2.9* 3.3*   No results for input(s): LIPASE, AMYLASE in the last 168 hours. No results for input(s): AMMONIA in the last 168 hours.  CBC:  Recent Labs Lab 12/19/14 1120  12/20/14 2110 12/21/14 0355 12/21/14 1623 12/22/14 0440 12/23/14 1054 12/24/14 2200 12/24/14 2239  WBC 11.2*  < > 8.6 6.8  --  5.3 6.6 6.9  --   NEUTROABS 10.2*  --   --   --   --   --  5.5 5.4  --   HGB 9.2*  < >  8.1* 9.6* 9.9* 9.3* 9.9* 10.8* 11.6*  HCT 26.8*  < > 24.0* 28.0* 29.0* 27.1* 29.8* 32.8* 34.0*  MCV 98.2  < > 97.6 91.8  --  93.4 96.1 96.5  --   PLT 221  < > 151 146*  --  150 200 198  --   < > = values in this interval not displayed.  Cardiac Enzymes:  Recent Labs Lab 12/19/14 1840 12/24/14 2200  TROPONINI 0.03 <0.03    BNP: Invalid input(s): POCBNP  CBG:  Recent Labs Lab 12/25/14 0158  GLUCAP 130*    Microbiology: Results for orders placed or performed during the hospital encounter of 12/19/14  MRSA PCR Screening     Status: None   Collection Time: 12/19/14  6:37 PM  Result Value Ref Range Status   MRSA by PCR NEGATIVE NEGATIVE Final    Comment:        The GeneXpert MRSA Assay (FDA approved for NASAL specimens only), is one component of a comprehensive MRSA colonization surveillance program. It is not intended to diagnose MRSA infection nor to guide or monitor treatment for MRSA infections.     Coagulation Studies:  Recent Labs  12/24/14 2200  LABPROT 12.9  INR 0.95    Urinalysis: No results for input(s): COLORURINE, LABSPEC, PHURINE, GLUCOSEU, HGBUR, BILIRUBINUR, KETONESUR, PROTEINUR, UROBILINOGEN, NITRITE, LEUKOCYTESUR in the last 168 hours.  Invalid input(s): APPERANCEUR  Lipid Panel:    Component Value Date/Time   CHOL 143 04/19/2014 1025   TRIG 108 04/19/2014 1025   HDL 63 04/19/2014 1025   CHOLHDL 2.3 04/19/2014 1025   VLDL 22 04/19/2014 1025   LDLCALC 58 04/19/2014 1025    HgbA1C:  Lab Results  Component Value Date   HGBA1C 5.5 07/20/2011    Urine Drug Screen:  No results found for: LABOPIA, COCAINSCRNUR, LABBENZ, AMPHETMU, THCU, LABBARB  Alcohol Level: No results for input(s): ETH in the last 168 hours.  Other results: EKG: Atrial fibrillation. Ventricular response 84 bpm.  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 Cardiac enlargement. Interstitial fibrosis in the lungs. No evidence of active pulmonary disease.       Ct Head Wo Contrast 12/25/2014  No acute intracranial abnormalities. Diffuse chronic atrophy and small vessel ischemic changes.     Mikey Bussing PA-C [facility] Pager [phone] 12/25/2014, 4:42 PM  Patient seen and examined.  Clinical course and management discussed.  Necessary edits performed.  I agree with the above.  Assessment and plan of care developed and discussed below.   Assessment: 81 y.o. female with a history of a mechanical aortic valve and atrial fibrillation on chronic anticoagulation. Her Coumadin was discontinued in February and she was placed on Lovenox secondary to GI bleeding problems. She recently developed a rectus sheath hematoma and is now off anticoagulation.  Patient was at home and developed slurred speech and drooling felt consistent with TIA.  Symptoms have resolved,  Also appears to have had an episode of visual disturbance prior to this.  Head CT personally reviewed and shows no acute changes.  MRI unable to be performed.  Suspect TIA versus a small acute infarct.    Stroke Risk Factors - hypertension, hyperlipidemia, age, prosthetic heart valve, atrial fibrillation, anticoagulation, and history of breast cancer.  Plan: 1. HgbA1c, fasting lipid panel 2. MRI, MRA  of the brain without contrast 3. PT consult, OT consult, Speech consult 4. Echocardiogram 5. Carotid dopplers 6. Prophylactic therapy-Antiplatelet med: Aspirin - dose 81 mg daily until cleared to resume Coumadin. 7. NPO until RN stroke swallow screen 8. Telemetry monitoring 9. Frequent neuro checks    Alexis Goodell, MD [facility] [phone]  12/25/2014  4:47 PM

## 2014-12-25 NOTE — H&P (Signed)
[facility] History and Physical  Deborah [name] HQI:[id number] DOB: 1926-07-04 DOA: 12/24/2014  Referring physician: ED physician PCP: [name], MD  Specialists:   Chief Complaint: Slurred speech and excessive saliva  HPI: Deborah [name] is a 81 y.o. female with PMH of recent admission for rectus sheath hematoma, hypertension, hyperlipidemia, GERD, hypothyroidism, gout, atrial fibrillation (anticoagulant on hold due to recent rectal sheath hematoma), anemia, breast cancer (S/P right lumpectomy and chemotherapy, radiation therapy), GI bleeding, arthritis, who presents with slurred speech and excessive saliva.  Patient was recently hospitalized from 9/19 to 12/22/14 due to rectus sheath hematoma, she was transfused with 3 units of blood. A blood thinner for atrial fibrillation was on hold per cardiologist's recommendation. She has been doing fine after discharged home until today when she developed mild slurred speech and a few seconds of increased saliva production. Her symptoms have resolved upon arrival to emergency room. She did not have difficulty swallowing, unilateral weakness, vision change or hearing loss. She reports having crampy abdominal pain and abdominal distention which she has been having since previous admission, and no significant change. She has mild cough and one episode of shortness of breath which has resolved. No chest pain or shortness stress currently. No fever or chills.  In ED, patient was found to have stable hemoglobin 9.9 on 12/23/14-->11.6, negative troponin, temperature normal, no tachycardia, electrolytes okay. CXR showed left central venous catheter position in the left mediastinum likely indicating a persistent left superior vena cava; cardiac enlargement; Interstitial fibrosis in the lungs; no evidence of active pulmonary disease. CT head is negative for acute abnormalities. patient is admitted to inpatient for further eval and treatment  Where does  patient live?   At home    Can patient participate in ADLs?   Barely  Review of Systems:   General: no fevers, chills, no changes in body weight, has fatigue HEENT: no blurry vision, hearing changes or sore throat Pulm: had dyspnea, coughing, no wheezing CV: no chest pain, palpitations Abd: no nausea, vomiting, has abdominal pain, no diarrhea, constipation. Has abdominal distention  GU: no dysuria, burning on urination, increased urinary frequency, hematuria  Ext: no leg edema Neuro: no unilateral weakness, numbness, or tingling, no vision change or hearing loss Skin: no rash MSK: No muscle spasm, no deformity, no limitation of range of movement in spin Heme: No easy bruising.  Travel history: No recent long distant travel.  Allergy:  Allergies  Allergen Reactions  . Amiodarone Shortness Of Breath  . Alendronate Sodium Other (See Comments)    GI upset  . Macrodantin [Nitrofurantoin] Other (See Comments)    GI upset  . Meclizine Swelling    tongue  . Norvasc [Amlodipine Besylate] Swelling    edema  . Tussionex Pennkinetic Er [Hydrocod Polst-Cpm Polst Er] Other (See Comments)    Reaction unknown    Past Medical History  Diagnosis Date  . Hypothyroidism   . Stomach problems     seeing Dr Watt Climes  . Hypertension   . GERD (gastroesophageal reflux disease)   . Anemia   . Arrhythmia     Atrial Fibrillation  . Breast cancer     "right; S/P lumpectomy, chemo, XRT"   . Complication of anesthesia     "once I'm awakened, I don't sleep til the following night"  . High cholesterol   . Heart murmur   . History of blood transfusion 1996; ~ 2013    "related to valve replacement; GI bleeding"   . Chronic  lower GI bleeding   . Arthritis     "fingers, toes, hips; qwhere" (05/19/2013)  . Gout   . Pneumonia 07/2011    Past Surgical History  Procedure Laterality Date  . Arteriogram  01/12    NO RAS   . Cardioversion      X 2  . Cardiac catheterization    . Cataract extraction  w/ intraocular lens  implant, bilateral Bilateral     bilateral cataract with lens implants  . Cholecystectomy    . Abdominal hysterectomy    . Appendectomy    . Colonoscopy with propofol N/A 11/24/2012    Procedure: COLONOSCOPY WITH PROPOFOL;  Surgeon: Jeryl Columbia, MD;  Location: [facility];  Service: Endoscopy;  Laterality: N/A;  . Breast biopsy Right   . Breast lumpectomy Right   . Aortic valve replacement  1996    [employer]  . Cardiac valve replacement  1996    Social History:  reports that she quit smoking about 26 years ago. Her smoking use included Cigarettes. She has a 20 pack-year smoking history. She has never used smokeless tobacco. She reports that she drinks about 4.8 oz of alcohol per week. She reports that she does not use illicit drugs.  Family History:  Family History  Problem Relation Age of Onset  . Dementia Mother   . Coronary artery disease Father      Prior to Admission medications   Medication Sig Start Date End Date Taking? Authorizing Provider  acetaminophen (TYLENOL) 500 MG tablet Take 1,000 mg by mouth every 6 (six) hours as needed for headache.    Historical Provider, MD  albuterol (PROVENTIL HFA;VENTOLIN HFA) 108 (90 BASE) MCG/ACT inhaler Inhale 1-2 puffs into the lungs every 6 (six) hours as needed for wheezing or shortness of breath. 09/15/14   Susanne Borders, NP  allopurinol (ZYLOPRIM) 300 MG tablet TAKE ONE TABLET EACH DAY 11/22/14   Darlin Coco, MD  amLODipine (NORVASC) 5 MG tablet Take 1 tablet (5 mg total) by mouth 2 (two) times daily. 08/02/14   Darlin Coco, MD  calcium-vitamin D 500 MG tablet Take 1 tablet by mouth 2 (two) times daily.      Historical Provider, MD  dicyclomine (BENTYL) 10 MG capsule Take 10 mg by mouth daily as needed for spasms.     Historical Provider, MD  [place] 125 MCG tablet TAKE ONE TABLET EACH DAY Patient taking differently: TAKE ONE TABLET EACH DAY BY MOUTH 10/31/14   Darlin Coco, MD  fexofenadine [facility]  ALLERGY) 180 MG tablet Take 180 mg by mouth daily as needed for allergies.     Historical Provider, MD  furosemide (LASIX) 40 MG tablet Take 40 mg by mouth daily as needed (for swelling).    Historical Provider, MD  hydrochlorothiazide (MICROZIDE) 12.5 MG capsule Take 1 capsule (12.5 mg total) by mouth 2 (two) times daily. 09/21/14   Darlin Coco, MD  hydrOXYzine (VISTARIL) 25 MG capsule Take 1 capsule (25 mg total) by mouth 3 (three) times daily as needed. 12/22/14   [name], MD  levothyroxine (SYNTHROID, LEVOTHROID) 88 MCG tablet Take 88 mcg by mouth daily before breakfast.    Historical Provider, MD  lidocaine-prilocaine (EMLA) cream Apply 1 application topically as needed (prior to infusion at porta cath site). 09/01/14   [name], MD  losartan (COZAAR) 50 MG tablet Take 1 tablet (50 mg total) by mouth 2 (two) times daily. 07/29/14   Darlin Coco, MD  metoprolol tartrate (LOPRESSOR)  25 MG tablet Take 12.5 mg by mouth 2 (two) times daily. 1/2 twice a day    Historical Provider, MD  multivitamin [facility]) per tablet Take 1 tablet by mouth daily.     Historical Provider, MD  NITROSTAT 0.4 MG SL tablet   09/12/14   Historical Provider, MD  Omega-3 Fatty Acids (FISH OIL) 1000 MG CAPS Take 1,000 mg by mouth 2 (two) times daily.     Historical Provider, MD  potassium chloride SA (K-DUR,KLOR-CON) 20 MEQ tablet Take 20 mEq by mouth daily.    Historical Provider, MD  simvastatin (ZOCOR) 20 MG tablet Take 10 mg by mouth daily.    Historical Provider, MD  warfarin (COUMADIN) 5 MG tablet Take 1 tablet (5 mg total) by mouth daily. 12/28/14   Debbe Odea, MD  zolpidem (AMBIEN CR) 12.5 MG CR tablet TAKE ONE TABLET AT BEDTIME AS NEEDED Patient taking differently: TAKE ONE TABLET AT BEDTIME AS NEEDED FOR SLEEP 08/07/14   Darlin Coco, MD    Physical Exam: Filed Vitals:   12/24/14 2315 12/25/14 0132 12/25/14 0145 12/25/14 0200  BP: 171/69  153/66 137/47  Pulse: 84  121 82  Temp:  98  F (36.7 C)    TempSrc:      Resp: 27     Height:      Weight:      SpO2: 99%  97% 96%   General: Not in acute distress HEENT:       Eyes: PERRL, EOMI, no scleral icterus.       ENT: No discharge from the ears and nose, no pharynx injection, no tonsillar enlargement.        Neck: No JVD, no bruit, no mass felt. Heme: No neck lymph node enlargement. Cardiac: S1/S2, RRR, mechanical aortic valve murmurs, No gallops or rubs. Pulm: Good air movement bilaterally. No rales, wheezing, rhonchi or rubs. Abd: Soft, distended, mild tenderness over lower abdomen, no rebound pain, no organomegaly, BS present. Ext: No pitting leg edema bilaterally. 2+DP/PT pulse bilaterally. Musculoskeletal: No joint deformities, No joint redness or warmth, no limitation of ROM in spin. Skin: No rashes. Has ecchymosis over upper thighs, lower back and abdomen. Neuro: Alert, oriented X3, cranial nerves II-XII grossly intact, muscle strength 5/5 in all extremities, sensation to light touch intact. Brachial reflex 1+ bilaterally. Knee reflex 1+ bilaterally. Negative Babinski's sign. Normal finger to nose test. Psych: Patient is not psychotic, no suicidal or hemocidal ideation.  Labs on Admission:  Basic Metabolic Panel:  Recent Labs Lab 12/20/14 0300 12/21/14 1126 12/21/14 1238 12/22/14 0440 12/24/14 2200 12/24/14 2239  NA 123* 129* 130* 133* 135 133*  K 4.6 3.9 4.3 3.8 4.4 4.2  CL 90* 97* 97* 101 99* 97*  CO2 '27 28 28 28 27  '$ --   GLUCOSE 114* 112* 90 95 132* 131*  BUN 25* '16 15 11 '$ 22* 33*  CREATININE 1.32* 0.78 0.86 0.75 0.79 0.90  CALCIUM 9.0 8.9 8.9 8.6* 9.8  --    Liver Function Tests:  Recent Labs Lab 12/20/14 0300 12/24/14 2200  AST 29 46*  ALT 20 28  ALKPHOS 36* 65  BILITOT 0.3 1.3*  PROT 4.7* 5.9*  ALBUMIN 2.9* 3.3*   No results for input(s): LIPASE, AMYLASE in the last 168 hours. No results for input(s): AMMONIA in the last 168 hours. CBC:  Recent Labs Lab 12/19/14 1120   12/20/14 2110 12/21/14 0355 12/21/14 1623 12/22/14 0440 12/23/14 1054 12/24/14 2200 12/24/14 2239  WBC 11.2*  < >  8.6 6.8  --  5.3 6.6 6.9  --   NEUTROABS 10.2*  --   --   --   --   --  5.5 5.4  --   HGB 9.2*  < > 8.1* 9.6* 9.9* 9.3* 9.9* 10.8* 11.6*  HCT 26.8*  < > 24.0* 28.0* 29.0* 27.1* 29.8* 32.8* 34.0*  MCV 98.2  < > 97.6 91.8  --  93.4 96.1 96.5  --   PLT 221  < > 151 146*  --  150 200 198  --   < > = values in this interval not displayed. Cardiac Enzymes:  Recent Labs Lab 12/19/14 1840 12/24/14 2200  TROPONINI 0.03 <0.03    BNP (last 3 results) No results for input(s): BNP in the last 8760 hours.  ProBNP (last 3 results) No results for input(s): PROBNP in the last 8760 hours.  CBG:  Recent Labs Lab 12/25/14 0158  GLUCAP 130*    Radiological Exams on Admission: Dg Chest 2 View  12/25/2014   CLINICAL DATA:  Shortness of breath and anemia. Released from hospital on 12/22/2014.  EXAM: CHEST  2 VIEW  COMPARISON:  05/22/2012  FINDINGS: Postoperative changes in the mediastinum and right axilla. 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 No pneumothorax. Diffuse cardiac enlargement. No pulmonary vascular congestion. Interstitial changes in the lungs likely represent fibrosis. No focal consolidation or airspace disease in the lungs. No blunting of costophrenic angles. No pneumothorax. Calcified and tortuous aorta.  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 Cardiac enlargement. Interstitial fibrosis in the lungs. No evidence of active pulmonary disease.   Electronically Signed   By: Lucienne Capers M.D.   On: 12/25/2014 00:13   Ct Head Wo Contrast  12/25/2014   CLINICAL DATA:  Stroke-like symptoms. Drooling and slurred speech at home. Discharged home on Thursday after GI bleeding. Blood  thinner stopped Saturday. Dizzy feeling  EXAM: CT HEAD WITHOUT CONTRAST  TECHNIQUE: Contiguous axial images were obtained from the base of the skull through the vertex without intravenous contrast.  COMPARISON:  03/16/2012  FINDINGS: Diffuse cerebral atrophy. Mild ventricular dilatation consistent with central atrophy. Low-attenuation changes in the deep white matter consistent with small vessel ischemia. No mass effect or midline shift. No abnormal extra-axial fluid collections. Gray-white matter junctions are distinct. Basal cisterns are not effaced. No evidence of acute intracranial hemorrhage. No depressed skull fractures. Opacification of the right sphenoid sinus. Mastoid air cells are not opacified. Vascular calcifications.  IMPRESSION: No acute intracranial abnormalities. Diffuse chronic atrophy and small vessel ischemic changes.   Electronically Signed   By: Lucienne Capers M.D.   On: 12/25/2014 00:47    EKG: Independently reviewed.  Abnormal findings: Atrial fibrillation, old T wave inversion in inferior leads and V4-V6, which existed in previous EKG on 12/19/14.  Assessment/Plan Principal Problem:   Dysarthria Active Problems:   Hypothyroidism   S/P aortic valve replacement with metallic valve   Atrial fibrillation   Dyslipidemia   Iron deficiency anemia due to chronic blood loss   GI bleed   Rectus sheath hematoma   HLD (hyperlipidemia)   Essential hypertension   Gout   GERD (gastroesophageal reflux disease)  Dysarthria: She had transient slurred speech, which has already resolved completely. This is concerning for TIA given history of A. fib, mechanic aortic valve replacement and discontinuation of anticoagulant.  CT head is negative for acute abnormalities. Patient is not a candidate for MRI due to mechanical valve.  -will admit to tele bed for observation -ASA is not good choice and pt refused ASA due to concerns for further intra-abdominal bleeding. -continue Zocor, blood  pressure control -pt/ot -SLP  Recent hx of rectus sheath hematoma: was on Lovenox injections since February of this year. Lovenox on hold now. Hgb stable. Has abdominal pain, which has not changed significantly. -Observe closely  -f/u by CBC   S/P aortic valve replacement with metallic valve: Per discharge summary, cardiology recommended holding off on anticoagulation and then starting Coumadin in 1 week (counting from 12/22/14). Hematology was in agreement with this plan but with recommendation for INR of 2.0-2.5 -follow-up with Dr. Mare Ferrari as outpt per discharge summary. - Lovenox on hold now.  Anemia due to acute blood loss: Hgb stable. -f/u by CBC  Atrial Fibrillation: CHA2DS2-VASc Score is 4, was on lovenox which is hold as above. Heart rate is well controlled. -Continue metoprolol and digoxin -check digoxin level  Hypothyroidism: Last TSH was 1.915 on 04/19/13. -Continue home Synthroid  HLD: Last LDL was 58 on 04/19/14 -Continue home medications: Zocor  HTN: -Hold lasix. -Amlodipine, HCTZ, Cozaar, metoprolol  Gout: stable. -Continue allopurinol  DVT ppx: SCD  Code Status: Full code Family Communication:  Yes, patient's daughter at bed side Disposition Plan: Admit to inpatient   Date of Service 12/25/2014    Ivor Costa [facility] Pager [phone]  If 7PM-7AM, please contact night-coverage [email or web address] Password [email or web address] 12/25/2014, 3:22 AM

## 2014-12-25 NOTE — ED Notes (Signed)
Attempted report X1

## 2014-12-26 ENCOUNTER — Telehealth: Payer: Self-pay | Admitting: Cardiology

## 2014-12-26 ENCOUNTER — Observation Stay (HOSPITAL_COMMUNITY): Payer: Medicare Other

## 2014-12-26 ENCOUNTER — Observation Stay (HOSPITAL_BASED_OUTPATIENT_CLINIC_OR_DEPARTMENT_OTHER): Payer: Medicare Other

## 2014-12-26 ENCOUNTER — Other Ambulatory Visit: Payer: Self-pay | Admitting: Oncology

## 2014-12-26 ENCOUNTER — Encounter (HOSPITAL_COMMUNITY): Payer: Self-pay | Admitting: Radiology

## 2014-12-26 ENCOUNTER — Telehealth: Payer: Self-pay | Admitting: Physician Assistant

## 2014-12-26 DIAGNOSIS — I6789 Other cerebrovascular disease: Secondary | ICD-10-CM

## 2014-12-26 DIAGNOSIS — G459 Transient cerebral ischemic attack, unspecified: Secondary | ICD-10-CM

## 2014-12-26 DIAGNOSIS — E785 Hyperlipidemia, unspecified: Secondary | ICD-10-CM | POA: Diagnosis not present

## 2014-12-26 DIAGNOSIS — Z954 Presence of other heart-valve replacement: Secondary | ICD-10-CM

## 2014-12-26 DIAGNOSIS — I10 Essential (primary) hypertension: Secondary | ICD-10-CM

## 2014-12-26 DIAGNOSIS — R471 Dysarthria and anarthria: Secondary | ICD-10-CM

## 2014-12-26 DIAGNOSIS — I482 Chronic atrial fibrillation: Secondary | ICD-10-CM | POA: Diagnosis not present

## 2014-12-26 DIAGNOSIS — I481 Persistent atrial fibrillation: Secondary | ICD-10-CM | POA: Diagnosis not present

## 2014-12-26 DIAGNOSIS — G451 Carotid artery syndrome (hemispheric): Secondary | ICD-10-CM

## 2014-12-26 DIAGNOSIS — I6330 Cerebral infarction due to thrombosis of unspecified cerebral artery: Secondary | ICD-10-CM | POA: Diagnosis not present

## 2014-12-26 LAB — LIPID PANEL
Cholesterol: 138 mg/dL (ref 0–200)
HDL: 55 mg/dL (ref >40)
LDL Cholesterol: 64 mg/dL (ref 0–99)
TRIGLYCERIDES: 94 mg/dL (ref <150)
Total CHOL/HDL Ratio: 2.5 RATIO
VLDL: 19 mg/dL (ref 0–40)

## 2014-12-26 LAB — HEMOGLOBIN AND HEMATOCRIT, BLOOD
HEMATOCRIT: 30.8 % — AB (ref 36.0–46.0)
Hemoglobin: 9.9 g/dL — ABNORMAL LOW (ref 12.0–15.0)

## 2014-12-26 MED ORDER — ASPIRIN 81 MG PO CHEW: 81.0000 mg | CHEWABLE_TABLET | Freq: Every day | ORAL | Status: DC

## 2014-12-26 MED ORDER — IOHEXOL 350 MG/ML SOLN
50.0000 mL | Freq: Once | INTRAVENOUS | Status: AC | PRN
  Administered 2014-12-26: 50 mL via INTRAVENOUS

## 2014-12-26 NOTE — Progress Notes (Signed)
Pt ambulating independently within room and unit halls. Politely refusing SCD's. VTE protocol explained to pt and family. Wendee Copp

## 2014-12-26 NOTE — Telephone Encounter (Signed)
Spoke with daughter and she would like cardiology included in patients care while in the hospital Spoke with [place] and she will add to the list for someone to see her

## 2014-12-26 NOTE — Discharge Instructions (Signed)
Follow with Primary MD [name], MD and Dr Mare Ferrari in 2-3 days. Start Coumadin as instructed by your Cardiologist. Stop Aspirin when INR reaches 2  Get CBC, CMP, INR  &  Results of your A1c test checked  by Primary MD next visit.  Activity: As tolerated with Full fall precautions use walker/cane & assistance as needed  Disposition Home   Diet: Heart Healthy  .  For Heart failure patients - Check your Weight same time everyday, if you gain over 2 pounds, or you develop in leg swelling, experience more shortness of breath or chest pain, call your Primary MD immediately. Follow Cardiac Low Salt Diet and 1.5 lit/day fluid restriction.   On your next visit with your primary care physician please Get Medicines reviewed and adjusted.   Please request your Prim.MD to go over all Hospital Tests and Procedure/Radiological results at the follow up, please get all Hospital records sent to your Prim MD by signing hospital release before you go home.   If you experience worsening of your admission symptoms, develop shortness of breath, life threatening emergency, suicidal or homicidal thoughts you must seek medical attention immediately by calling 911 or calling your MD immediately  if symptoms less severe.  You Must read complete instructions/literature along with all the possible adverse reactions/side effects for all the Medicines you take and that have been prescribed to you. Take any new Medicines after you have completely understood and accpet all the possible adverse reactions/side effects.   Do not drive, operating heavy machinery, perform activities at heights, swimming or participation in water activities or provide baby sitting services if your were admitted for syncope or siezures until you have seen by Primary MD or a Neurologist and advised to do so again.  Do not drive when taking Pain medications.    Do not take more than prescribed Pain, Sleep and Anxiety  Medications  Special Instructions: If you have smoked or chewed Tobacco  in the last 2 yrs please stop smoking, stop any regular Alcohol  and or any Recreational drug use.  Wear Seat belts while driving.   Please note  You were cared for by a hospitalist during your hospital stay. If you have any questions about your discharge medications or the care you received while you were in the hospital after you are discharged, you can call the unit and asked to speak with the hospitalist on call if the hospitalist that took care of you is not available. Once you are discharged, your primary care physician will handle any further medical issues. Please note that NO REFILLS for any discharge medications will be authorized once you are discharged, as it is imperative that you return to your primary care physician (or establish a relationship with a primary care physician if you do not have one) for your aftercare needs so that they can reassess your need for medications and monitor your lab values.

## 2014-12-26 NOTE — Evaluation (Signed)
Physical Therapy Evaluation and Discharge Patient Details Name: Deborah [name] MRN: [id number] DOB: Jun 22, 1926 Today's Date: 12/26/2014   History of Present Illness  Deborah [name] is a 81 y.o. female with PMH of recent admission for rectus sheath hematoma, hypertension, hyperlipidemia, GERD, hypothyroidism, gout, atrial fibrillation (anticoagulant on hold due to recent rectal sheath hematoma), anemia, breast cancer (S/P right lumpectomy and chemotherapy, radiation therapy), GI bleeding, arthritis, who presents with slurred speech and excessive saliva.    Clinical Impression  Pt functioning near baseline with residual soreness from large hematoma last week. Pt reports son to stay with her for a few days. Pt safe to d/c home with son and HHPT to improved strength and endurance. Pt with no further skilled acute PT needs at this time. PT SIGNING OFF. Please re-consult if needed in future.    Follow Up Recommendations Home health PT;Supervision - Intermittent    Equipment Recommendations  None recommended by PT    Recommendations for Other Services       Precautions / Restrictions Precautions Precautions: None Restrictions Weight Bearing Restrictions: No Other Position/Activity Restrictions: pt with noted bruising around hips and down bilat LEs      Mobility  Bed Mobility               General bed mobility comments: pt up in chair  Transfers Overall transfer level: Modified independent Equipment used: None             General transfer comment: pt with use of UEs to push up from chair, steady  Ambulation/Gait Ambulation/Gait assistance: Supervision Ambulation Distance (Feet): 500 Feet Assistive device: None Gait Pattern/deviations: WFL(Within Functional Limits) Gait velocity: wfl Gait velocity interpretation: at or above normal speed for age/gender General Gait Details: increased trunk flexion, no episodes of LOB or unsteadiness, pt c/o soreness from hematoma  last week  Stairs Stairs: Yes Stairs assistance: Min guard Stair Management: One rail Right;Alternating pattern Number of Stairs: 10 General stair comments: no episodes of LOB  Wheelchair Mobility    Modified Rankin (Stroke Patients Only) Modified Rankin (Stroke Patients Only) Pre-Morbid Rankin Score: No symptoms Modified Rankin: No significant disability     Balance Overall balance assessment: No apparent balance deficits (not formally assessed)                               Standardized Balance Assessment Standardized Balance Assessment : Dynamic Gait Index   Dynamic Gait Index Level Surface: Normal Change in Gait Speed: Normal Gait with Horizontal Head Turns: Normal Gait with Vertical Head Turns: Normal Gait and Pivot Turn: Normal Step Over Obstacle: Mild Impairment Step Around Obstacles: Normal Steps: Moderate Impairment Total Score: 21       Pertinent Vitals/Pain Pain Location: bilat LEs Pain Descriptors / Indicators: Sore    Home Living Family/patient expects to be discharged to:: Private residence Living Arrangements: Alone (lives at [facility]) Available Help at Discharge: Family;Available 24 hours/day (son to stay with her for a few days) Type of Home: Independent living facility Home Access: Level entry     Home Layout: One level Home Equipment: None      Prior Function Level of Independence: Independent               Hand Dominance   Dominant Hand: Right    Extremity/Trunk Assessment   Upper Extremity Assessment: Generalized weakness           Lower  Extremity Assessment: Generalized weakness      Cervical / Trunk Assessment: Normal  Communication   Communication: No difficulties  Cognition Arousal/Alertness: Awake/alert Behavior During Therapy: WFL for tasks assessed/performed Overall Cognitive Status: Within Functional Limits for tasks assessed                      General  Comments General comments (skin integrity, edema, etc.): pt with significant bruising t/o LEs, abdomen, pelvis    Exercises        Assessment/Plan    PT Assessment Patent does not need any further PT services  PT Diagnosis Generalized weakness   PT Problem List    PT Treatment Interventions     PT Goals (Current goals can be found in the Care Plan section) Acute Rehab PT Goals Patient Stated Goal: return to [facility] PT Goal Formulation: All assessment and education complete, DC therapy    Frequency     Barriers to discharge        Co-evaluation               End of Session Equipment Utilized During Treatment: Gait belt Activity Tolerance: Patient tolerated treatment well Patient left: in chair;with call bell/phone within reach Nurse Communication: Mobility status    Functional Assessment Tool Used: clinical judgement Functional Limitation: Mobility: Walking and moving around Mobility: Walking and Moving Around Current Status (H2122): At least 1 percent but less than 20 percent impaired, limited or restricted Mobility: Walking and Moving Around Goal Status [phone]): At least 1 percent but less than 20 percent impaired, limited or restricted Mobility: Walking and Moving Around Discharge Status [phone]): At least 1 percent but less than 20 percent impaired, limited or restricted    Time: 0749-0813 PT Time Calculation (min) (ACUTE ONLY): 24 min   Charges:   PT Evaluation $Initial PT Evaluation Tier I: 1 Procedure PT Treatments $Gait Training: 8-22 mins   PT G Codes:   PT G-Codes **NOT FOR INPATIENT CLASS** Functional Assessment Tool Used: clinical judgement Functional Limitation: Mobility: Walking and moving around Mobility: Walking and Moving Around Current Status (U8891): At least 1 percent but less than 20 percent impaired, limited or restricted Mobility: Walking and Moving Around Goal Status [phone]): At least 1 percent but less than 20 percent impaired,  limited or restricted Mobility: Walking and Moving Around Discharge Status [phone]): At least 1 percent but less than 20 percent impaired, limited or restricted    Kingsley Callander 12/26/2014, 12:11 PM  Kittie Plater, PT, DPT Pager #: [phone] Office #: [phone]

## 2014-12-26 NOTE — Telephone Encounter (Signed)
New message      Pt is in the hosp with a mild stroke.  She was admitted Saturday.  She is off blood thinner and on baby aspirin.  When will Dr Mare Ferrari want to start coumadin?  Family is concerned that she will have a major stroke if off blood thinner for a long time

## 2014-12-26 NOTE — Evaluation (Signed)
Speech Language Pathology Evaluation Patient Details Name: Deborah [name] MRN: [id number] DOB: 02/23/1927 Today's Date: 12/26/2014 Time: 1450-1510 SLP Time Calculation (min) (ACUTE ONLY): 20 min  Problem List:  Patient Active Problem List   Diagnosis Date Noted  . Cerebral thrombosis with cerebral infarction 12/26/2014  . TIA (transient ischemic attack)   . Dysarthria 12/25/2014  . HLD (hyperlipidemia) 12/25/2014  . Essential hypertension 12/25/2014  . Gout 12/25/2014  . GERD (gastroesophageal reflux disease) 12/25/2014  . Absolute anemia   . Rectus sheath hematoma 12/19/2014  . Acute blood loss anemia 12/19/2014  . Hematoma 12/19/2014  . Pain 06/14/2014  . Poor venous access   . GI bleed 05/13/2014  . Melena 05/19/2013  . Coagulopathy 01/27/2013  . Iron deficiency anemia due to chronic blood loss 07/24/2012  . Malaise and fatigue 05/21/2012  . Dyslipidemia 12/04/2011  . Atrial fibrillation 09/06/2011  . Long term current use of anticoagulant therapy 08/08/2011  . Nonsustained ventricular tachycardia 07/21/2011  . Hypokalemia 07/20/2011  . Hyponatremia 07/20/2011  . Bronchitis 07/20/2011  . S/P aortic valve replacement with metallic valve 74/25/9563  . Hypothyroidism   . Benign hypertensive heart disease without heart failure    Past Medical History:  Past Medical History  Diagnosis Date  . Hypothyroidism   . Stomach problems     seeing Dr Watt Climes  . Hypertension   . GERD (gastroesophageal reflux disease)   . Anemia   . Arrhythmia     Atrial Fibrillation  . Breast cancer     "right; S/P lumpectomy, chemo, XRT"   . Complication of anesthesia     "once I'm awakened, I don't sleep til the following night"  . High cholesterol   . Heart murmur   . History of blood transfusion 1996; ~ 2013    "related to valve replacement; GI bleeding"   . Chronic lower GI bleeding   . Arthritis     "fingers, toes, hips; qwhere" (05/19/2013)  . Gout   . Pneumonia 07/2011   Past  Surgical History:  Past Surgical History  Procedure Laterality Date  . Arteriogram  01/12    NO RAS   . Cardioversion      X 2  . Cardiac catheterization    . Cataract extraction w/ intraocular lens  implant, bilateral Bilateral     bilateral cataract with lens implants  . Cholecystectomy    . Abdominal hysterectomy    . Appendectomy    . Colonoscopy with propofol N/A 11/24/2012    Procedure: COLONOSCOPY WITH PROPOFOL;  Surgeon: Jeryl Columbia, MD;  Location: [facility];  Service: Endoscopy;  Laterality: N/A;  . Breast biopsy Right   . Breast lumpectomy Right   . Aortic valve replacement  1996    [employer]  . Cardiac valve replacement  1996   HPI:  Deborah [name] is a 81 y.o. female with PMH of recent admission for rectus sheath hematoma, hypertension, hyperlipidemia, GERD, hypothyroidism, gout, atrial fibrillation (anticoagulant on hold due to recent rectal sheath hematoma), anemia, breast cancer (S/P right lumpectomy and chemotherapy, radiation therapy), GI bleeding, arthritis, who presents with slurred speech and excessive saliva.    Assessment / Plan / Recommendation Clinical Impression  Pt appears to be functioning at or near her baseline, with no apparent deficits with cognitive-linguistic function or speech noted. No acute SLP needs identified, will sign off.    SLP Assessment  Patient does not need any further Speech Lanaguage Pathology Services    Pertinent Vitals/Pain Pain  Assessment: No/denies pain   SLP Goals  Patient/Family Stated Goal: none stated  SLP Evaluation Prior Functioning  Cognitive/Linguistic Baseline: Within functional limits Type of Home: Independent living facility  Lives With: Alone Available Help at Discharge: Family;Available 24 hours/day (son to stay with her for several days)   Cognition  Overall Cognitive Status: Within Functional Limits for tasks assessed Orientation Level: Oriented X4    Comprehension  Auditory Comprehension Overall  Auditory Comprehension: Appears within functional limits for tasks assessed Visual Recognition/Discrimination Discrimination: Within Function Limits    Expression Expression Primary Mode of Expression: Verbal Verbal Expression Overall Verbal Expression: Appears within functional limits for tasks assessed   Oral / Motor Motor Speech Overall Motor Speech: Appears within functional limits for tasks assessed   GO Functional Assessment Tool Used: skilled clinical judgment Functional Limitations: Motor speech Motor Speech Current Status [phone]): At least 1 percent but less than 20 percent impaired, limited or restricted Motor Speech Goal Status [phone]): At least 1 percent but less than 20 percent impaired, limited or restricted Motor Speech Goal Status [phone]): At least 1 percent but less than 20 percent impaired, limited or restricted    Deborah [name], M.A. CCC-SLP [phone]  Deborah [name] 12/26/2014, 3:19 PM

## 2014-12-26 NOTE — Telephone Encounter (Signed)
Patient's daughter Dixon Boos called answering service inquiring about when patient might be discharged. Per d/w Dr. Gwenlyn Found, [place] to discharge. 2D echo is still pending but there has been a delay in getting this read. Dr. Gwenlyn Found was comfortable letting the patient go home with our team following up on this result tomorrow since the patient's daughter was very anxious to finalize discharge process. Will forward to Cecilie Kicks NP since I am off tomorrow and she saw the patient in consultation. Please review echo and forward information to nurse to call patient/daughter with echo result. Thank you!  Internal med also made aware OK to dc. Patient's daughter was very Patent attorney.  Dayna Dunn PA-C

## 2014-12-26 NOTE — Telephone Encounter (Signed)
Agree with plan 

## 2014-12-26 NOTE — Discharge Summary (Signed)
Deborah [name], is a 81 y.o. female  DOB Jun 08, 1926  MRN [id number].  Admission date:  12/24/2014  Admitting Physician  Ivor Costa, MD  Discharge Date:  12/26/2014   Primary MD  [name], MD  Recommendations for primary care physician for things to follow:   Monitor secondary risk factors for CVA, resume Coumadin once cleared by cardiologist and hematologist. Stop aspirin once INR reaches 2   Admission Diagnosis  Dysarthria [R47.1]   Discharge Diagnosis  Dysarthria [R47.1]     Principal Problem:   Dysarthria Active Problems:   Hypothyroidism   S/P aortic valve replacement with metallic valve   Atrial fibrillation   Dyslipidemia   Iron deficiency anemia due to chronic blood loss   GI bleed   Rectus sheath hematoma   HLD (hyperlipidemia)   Essential hypertension   Gout   GERD (gastroesophageal reflux disease)   Cerebral thrombosis with cerebral infarction   TIA (transient ischemic attack)      Past Medical History  Diagnosis Date  . Hypothyroidism   . Stomach problems     seeing Dr Watt Climes  . Hypertension   . GERD (gastroesophageal reflux disease)   . Anemia   . Arrhythmia     Atrial Fibrillation  . Breast cancer     "right; S/P lumpectomy, chemo, XRT"   . Complication of anesthesia     "once I'm awakened, I don't sleep til the following night"  . High cholesterol   . Heart murmur   . History of blood transfusion 1996; ~ 2013    "related to valve replacement; GI bleeding"   . Chronic lower GI bleeding   . Arthritis     "fingers, toes, hips; qwhere" (05/19/2013)  . Gout   . Pneumonia 07/2011  . Rectus sheath hematoma 12/19/14     held anticoagulation for 1 week, transfused  . TIA (transient ischemic attack) 12/24/14    symptoms resolved    Past Surgical History  Procedure  Laterality Date  . Arteriogram  01/12    NO RAS   . Cardioversion      X 2  . Cardiac catheterization    . Cataract extraction w/ intraocular lens  implant, bilateral Bilateral     bilateral cataract with lens implants  . Cholecystectomy    . Abdominal hysterectomy    . Appendectomy    . Colonoscopy with propofol N/A 11/24/2012    Procedure: COLONOSCOPY WITH PROPOFOL;  Surgeon: Jeryl Columbia, MD;  Location: [facility];  Service: Endoscopy;  Laterality: N/A;  . Breast biopsy Right   . Breast lumpectomy Right   . Aortic valve replacement  1996    [employer]. [employer]       HPI  from the history and physical done on the day of admission:    Deborah [name] is a 81 y.o. female with PMH of recent admission for rectus sheath hematoma, hypertension, hyperlipidemia, GERD, hypothyroidism, gout, atrial fibrillation (anticoagulant on hold due to recent rectal sheath hematoma), anemia, breast cancer (S/P  right lumpectomy and chemotherapy, radiation therapy), GI bleeding, arthritis, who presents with slurred speech and excessive saliva.  Patient was recently hospitalized from 9/19 to 12/22/14 due to rectus sheath hematoma, she was transfused with 3 units of blood. A blood thinner for atrial fibrillation was on hold per cardiologist's recommendation. She has been doing fine after discharged home until today when she developed mild slurred speech and a few seconds of increased saliva production. Her symptoms have resolved upon arrival to emergency room. She did not have difficulty swallowing, unilateral weakness, vision change or hearing loss. She reports having crampy abdominal pain and abdominal distention which she has been having since previous admission, and no significant change. She has mild cough and one episode of shortness of breath which has resolved. No chest pain or shortness stress currently. No fever or chills.  In ED, patient was found to have stable hemoglobin 9.9 on 12/23/14-->11.6, negative  troponin, temperature normal, no tachycardia, electrolytes okay. CXR showed left central venous catheter position in the left mediastinum likely indicating a persistent left superior vena cava; cardiac enlargement; Interstitial fibrosis in the lungs; no evidence of active pulmonary disease. CT head is negative for acute abnormalities. patient is admitted to inpatient for further eval and treatment      Hospital Course:    1. Dysarthria. In a patient with a 22 year old St. [employer] prosthetic aortic valve, chronic atrial fibrillation, who had issues with GI bleed in the past and now recently large rectus sheath bleed few days ago. Symptoms have completely resolved, likely had TIA versus a small stroke not picked up on 2 CT scans. Currently no focal deficits or dysarthria, no dysphagia. CT head unremarkable x 2 .   Seen by neurology with full stroke workup, carotid duplex unremarkable, echogram Pending cleared by Dr Gwenlyn Found for DC and to follow with Dr Mare Ferrari, currently placed on aspirin 81 mg, to resume Coumadin as per plan formulated by cardiologist and hematologist last admission few days ago. Started date to be decided by cardiology and hematology. Plan is to stop aspirin once INR reaches 2. INR goal is 2-2.5 in the light of multiple issues with GI bleed in the past on higher INRs. LDL was below 70, A1c is pending. Will follow with neurology, PCP, cardiology along with hematology postdischarge. Home PT ordered.    2. Chronic atrial fibrillation with prosthetic aortic valve, [place] Vasc score greater than 5. Discussed with Dr. Debara Pickett cardiologist and now formally seen by cardiology as well, aspirin for now is the only option, as you him Coumadin once cleared by cardiology and hematology in the outpatient setting could be as soon as tomorrow but this will be decided by the subspecialists as dictated above. Due to resume Coumadin on 29th of this month. Continue statin for secondary prevention. Continue digoxin  and Beta blockerfor rate control.   3. Dyslipidemia. Continue home dose statin   4. Essential hypertension. On HCTZ, ARB, Norvasc along with beta blocker continue.   5. Hypothyroidism. Continue Synthroid.    Discharge Condition: Fair  Follow UP  Follow-up Information    Follow up with MAGRINAT,GUSTAV C, MD. Schedule an appointment as soon as possible for a visit in 2 days.   Specialty:  Oncology   Contact information:   [place] [address] [phone]       Follow up with Warren Danes, MD. Schedule an appointment as soon as possible for a visit on 12/30/2014.   Specialty:  Cardiology   Why:  at 1:30 PM   Contact information:   [address] [place] [address] [phone]       Follow up with [facility]. Schedule an appointment as soon as possible for a visit in 1 week.   Why:  [facility]   Contact information:   [address]     [place] [id number] [phone]      Follow up with [facility] On 12/30/2014.   Specialty:  Cardiology   Why:  coumadin clinic at 10:45 AM   Contact information:   [address] [place] [phone]       Consults obtained - neurology, cardiology  Diet and Activity recommendation: See Discharge Instructions below  Discharge Instructions       Discharge Instructions    Diet - low sodium heart healthy    Complete by:  As directed      Discharge instructions    Complete by:  As directed   Follow with Primary MD [name], MD and Dr Mare Ferrari in 2-3 days. Start Coumadin as instructed by your Cardiologist. Stop Aspirin when INR reaches 2  Get CBC, CMP, INR  &  Results of your A1c test checked  by Primary MD next visit.  Activity: As tolerated with Full fall precautions use walker/cane & assistance as needed  Disposition Home   Diet: Heart Healthy  .  For Heart failure  patients - Check your Weight same time everyday, if you gain over 2 pounds, or you develop in leg swelling, experience more shortness of breath or chest pain, call your Primary MD immediately. Follow Cardiac Low Salt Diet and 1.5 lit/day fluid restriction.   On your next visit with your primary care physician please Get Medicines reviewed and adjusted.   Please request your Prim.MD to go over all Hospital Tests and Procedure/Radiological results at the follow up, please get all Hospital records sent to your Prim MD by signing hospital release before you go home.   If you experience worsening of your admission symptoms, develop shortness of breath, life threatening emergency, suicidal or homicidal thoughts you must seek medical attention immediately by calling 911 or calling your MD immediately  if symptoms less severe.  You Must read complete instructions/literature along with all the possible adverse reactions/side effects for all the Medicines you take and that have been prescribed to you. Take any new Medicines after you have completely understood and accpet all the possible adverse reactions/side effects.   Do not drive, operating heavy machinery, perform activities at heights, swimming or participation in water activities or provide baby sitting services if your were admitted for syncope or siezures until you have seen by Primary MD or a Neurologist and advised to do so again.  Do not drive when taking Pain medications.    Do not take more than prescribed Pain, Sleep and Anxiety Medications  Special Instructions: If you have smoked or chewed Tobacco  in the last 2 yrs please stop smoking, stop any regular Alcohol  and or any Recreational drug use.  Wear Seat belts while driving.   Please note  You were cared for by a hospitalist during your hospital stay. If you have any questions about your discharge medications or the care you received while you were in the hospital after you are  discharged, you can call the unit and asked to speak with the hospitalist on call if the hospitalist that  took care of you is not available. Once you are discharged, your primary care physician will handle any further medical issues. Please note that NO REFILLS for any discharge medications will be authorized once you are discharged, as it is imperative that you return to your primary care physician (or establish a relationship with a primary care physician if you do not have one) for your aftercare needs so that they can reassess your need for medications and monitor your lab values.     Increase activity slowly    Complete by:  As directed              Discharge Medications       Medication List    TAKE these medications        acetaminophen 500 MG tablet  Commonly known as:  TYLENOL  Take 1,000 mg by mouth every 6 (six) hours as needed for headache.     albuterol 108 (90 BASE) MCG/ACT inhaler  Commonly known as:  PROVENTIL HFA;VENTOLIN HFA  Inhale 1-2 puffs into the lungs every 6 (six) hours as needed for wheezing or shortness of breath.     ALLEGRA ALLERGY 180 MG tablet  Generic drug:  fexofenadine  Take 180 mg by mouth daily as needed for allergies.     allopurinol 300 MG tablet  Commonly known as:  ZYLOPRIM  TAKE ONE TABLET EACH DAY     amLODipine 5 MG tablet  Commonly known as:  NORVASC  Take 1 tablet (5 mg total) by mouth 2 (two) times daily.     aspirin 81 MG chewable tablet  Chew 1 tablet (81 mg total) by mouth daily. Stop once INR reaches 2     BENTYL 10 MG capsule  Generic drug:  dicyclomine  Take 10 mg by mouth daily as needed for spasms.     calcium-vitamin D 500 MG tablet  Take 1 tablet by mouth 2 (two) times daily.     DIGOX 0.125 MG tablet  Generic drug:  digoxin  TAKE ONE TABLET EACH DAY     Fish Oil 1000 MG Caps  Take 1,000 mg by mouth 2 (two) times daily.     furosemide 40 MG tablet  Commonly known as:  LASIX  Take 40 mg by mouth daily as  needed (for swelling).     hydrochlorothiazide 12.5 MG capsule  Commonly known as:  MICROZIDE  Take 1 capsule (12.5 mg total) by mouth 2 (two) times daily.     hydrOXYzine 25 MG capsule  Commonly known as:  VISTARIL  Take 1 capsule (25 mg total) by mouth 3 (three) times daily as needed.     levothyroxine 88 MCG tablet  Commonly known as:  SYNTHROID, LEVOTHROID  Take 88 mcg by mouth daily before breakfast.     lidocaine-prilocaine cream  Commonly known as:  EMLA  Apply 1 application topically as needed (prior to infusion at porta cath site).     losartan 50 MG tablet  Commonly known as:  COZAAR  Take 1 tablet (50 mg total) by mouth 2 (two) times daily.     metoprolol tartrate 25 MG tablet  Commonly known as:  LOPRESSOR  Take 12.5 mg by mouth 2 (two) times daily.     multivitamin per tablet  Take 1 tablet by mouth daily.     NITROSTAT 0.4 MG SL tablet  Generic drug:  nitroGLYCERIN  Place 0.4 mg under the tongue every 5 (five) minutes as needed for chest pain.  potassium chloride SA 20 MEQ tablet  Commonly known as:  K-DUR,KLOR-CON  Take 20 mEq by mouth daily.     simvastatin 20 MG tablet  Commonly known as:  ZOCOR  Take 10 mg by mouth daily.     warfarin 5 MG tablet  Commonly known as:  COUMADIN  Take 1 tablet (5 mg total) by mouth daily.  Start taking on:  12/28/2014     zolpidem 12.5 MG CR tablet  Commonly known as:  AMBIEN CR  TAKE ONE TABLET AT BEDTIME AS NEEDED        Major procedures and Radiology Reports - PLEASE review detailed and final reports for all details, in brief -   TTE  Pending cleared by Dr Gwenlyn Found for DC and to follow with Dr Mare Ferrari   Vascular Ultrasound  Carotid Duplex (Doppler) has been completed. Preliminary findings: Bilateral: No significant (1-39%) ICA stenosis. Antegrade vertebral flow.    Ct Angio Head W/cm &/or Wo Cm  12/26/2014   CLINICAL DATA:  81 year old female with stroke-like symptoms (slurred speech and  dizziness). Atrial fibrillation. Subsequent encounter.  EXAM: CT ANGIOGRAPHY HEAD  TECHNIQUE: Multidetector CT imaging of the head was performed using the standard protocol during bolus administration of intravenous contrast. Multiplanar CT image reconstructions and MIPs were obtained to evaluate the vascular anatomy.  CONTRAST:  32m OMNIPAQUE IOHEXOL 350 MG/ML SOLN  COMPARISON:  12/24/2014 head CT.  FINDINGS: CT HEAD  Brain: No intracranial hemorrhage. Small vessel disease type changes without CT evidence of large acute infarct. Global atrophy without hydrocephalus. No intracranial enhancing lesion. Partially empty sella.  Calvarium and skull base: No acute abnormality.  Paranasal sinuses: Opacification right sphenoid sinus.  Orbits: Post lens replacement otherwise negative.  CTA HEAD  Anterior circulation: Calcified cavernous segment internal carotid artery bilaterally with mild to slightly moderate narrowing bilaterally. Left internal carotid artery is ectatic proximal to the skullbase. Fetal type contribution to the left posterior cerebral artery.  Posterior circulation: Left vertebral artery is dominant. No high-grade stenosis of the distal vertebral arteries. Right vertebral artery is small after takeoff of the posterior inferior cerebellar artery. Ectatic basilar artery with mild narrowing proximal to mid aspect.  Venous sinuses: Negative  Anatomic variants: Negative  Delayed phase:Negative  IMPRESSION: Calcified cavernous segment internal carotid artery with mild to slightly moderate narrowing bilaterally.  Left vertebral artery is dominant. No high-grade stenosis of the distal vertebral arteries.  Ectatic basilar artery with mild narrowing proximal to mid aspect.   Electronically Signed   By: SGenia DelM.D.   On: 12/26/2014 12:44   Dg Chest 2 View  12/25/2014   CLINICAL DATA:  Shortness of breath and anemia. Released from hospital on 12/22/2014.  EXAM: CHEST  2 VIEW  COMPARISON:  05/22/2012   FINDINGS: Postoperative changes in the mediastinum and right axilla. 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 No pneumothorax. Diffuse cardiac enlargement. No pulmonary vascular congestion. Interstitial changes in the lungs likely represent fibrosis. No focal consolidation or airspace disease in the lungs. No blunting of costophrenic angles. No pneumothorax. Calcified and tortuous aorta.  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 Cardiac enlargement. Interstitial fibrosis in the lungs. No evidence of active pulmonary disease.   Electronically Signed   By: WLucienne CapersM.D.   On: 12/25/2014 00:13   Ct Head Wo Contrast  12/25/2014  CLINICAL DATA:  Stroke-like symptoms. Drooling and slurred speech at home. Discharged home on Thursday after GI bleeding. Blood thinner stopped Saturday. Dizzy feeling  EXAM: CT HEAD WITHOUT CONTRAST  TECHNIQUE: Contiguous axial images were obtained from the base of the skull through the vertex without intravenous contrast.  COMPARISON:  03/16/2012  FINDINGS: Diffuse cerebral atrophy. Mild ventricular dilatation consistent with central atrophy. Low-attenuation changes in the deep white matter consistent with small vessel ischemia. No mass effect or midline shift. No abnormal extra-axial fluid collections. Gray-white matter junctions are distinct. Basal cisterns are not effaced. No evidence of acute intracranial hemorrhage. No depressed skull fractures. Opacification of the right sphenoid sinus. Mastoid air cells are not opacified. Vascular calcifications.  IMPRESSION: No acute intracranial abnormalities. Diffuse chronic atrophy and small vessel ischemic changes.   Electronically Signed   By: Lucienne Capers M.D.   On: 12/25/2014 00:47       Micro Results      Recent Results (from  the past 240 hour(s))  MRSA PCR Screening     Status: None   Collection Time: 12/19/14  6:37 PM  Result Value Ref Range Status   MRSA by PCR NEGATIVE NEGATIVE Final    Comment:        The GeneXpert MRSA Assay (FDA approved for NASAL specimens only), is one component of a comprehensive MRSA colonization surveillance program. It is not intended to diagnose MRSA infection nor to guide or monitor treatment for MRSA infections.     Today   Subjective    Victorine Mcnee today has no headache,no chest abdominal pain,no new weakness tingling or numbness, feels much better wants to go home today.     Objective   Blood pressure 102/42, pulse 81, temperature 98.8 F (37.1 C), temperature source Oral, resp. rate 16, height 5' (1.524 m), weight 58.514 kg (129 lb), SpO2 97 %.  No intake or output data in the 24 hours ending 12/26/14 1635  Exam Awake Alert, Oriented x 3, No new F.N deficits, Normal affect [place].AT,PERRAL Supple Neck,No JVD, No cervical lymphadenopathy appriciated.  Symmetrical Chest wall movement, Good air movement bilaterally, CTAB RRR,No Gallops,Rubs or new Murmurs, No Parasternal Heave +ve B.Sounds, Abd Soft, Non tender, No organomegaly appriciated, No rebound -guarding or rigidity. No Cyanosis, Clubbing or edema, No new Rash, extensive bruise    Data Review   CBC w Diff:  Lab Results  Component Value Date   WBC 6.9 12/24/2014   WBC 6.6 12/23/2014   HGB 9.9* 12/26/2014   HGB 9.9* 12/23/2014   HCT 30.8* 12/26/2014   HCT 29.8* 12/23/2014   PLT 198 12/24/2014   PLT 200 12/23/2014   LYMPHOPCT 11 12/24/2014   LYMPHOPCT 8.1* 12/23/2014   MONOPCT 9 12/24/2014   MONOPCT 8.7 12/23/2014   EOSPCT 2 12/24/2014   EOSPCT 0.6 12/23/2014   BASOPCT 0 12/24/2014   BASOPCT 0.2 12/23/2014    CMP:  Lab Results  Component Value Date   NA 133* 12/24/2014   NA 132* 04/19/2014   K 4.2 12/24/2014   K 4.4 04/19/2014   CL 97* 12/24/2014   CO2 27 12/24/2014   CO2 28  04/19/2014   BUN 33* 12/24/2014   BUN 21.9 04/19/2014   CREATININE 0.90 12/24/2014   CREATININE 0.7 04/19/2014   PROT 5.9* 12/24/2014   PROT 6.0* 04/19/2014   ALBUMIN 3.3* 12/24/2014   ALBUMIN 3.6 04/19/2014   BILITOT 1.3* 12/24/2014   BILITOT 0.30 04/19/2014   ALKPHOS 65 12/24/2014   ALKPHOS 64  04/19/2014   AST 46* 12/24/2014   AST 19 04/19/2014   ALT 28 12/24/2014   ALT 20 04/19/2014  . Lab Results  Component Value Date   CHOL 138 12/26/2014   HDL 55 12/26/2014   LDLCALC 64 12/26/2014   LDLDIRECT 93.7 04/10/2011   TRIG 94 12/26/2014   CHOLHDL 2.5 12/26/2014   Lab Results  Component Value Date   HGBA1C 5.5 07/20/2011     Total Time in preparing paper work, data evaluation and todays exam - 35 minutes  Thurnell Lose M.D on 12/26/2014 at 4:35 PM  [facility]   Office  [phone]

## 2014-12-26 NOTE — Progress Notes (Addendum)
STROKE TEAM PROGRESS NOTE   SUBJECTIVE (INTERVAL HISTORY) Her son and daughter in low are at the bedside.  Overall she feels her condition is completely resolved. She is eager to go home. Plan to resume coumadin tomorrow with INR goal 2.0-2.5. Follow up with Dr. Tonia Brooms in cardiology.    OBJECTIVE Temp:  [98.3 F (36.8 C)-99.3 F (37.4 C)] 99 F (37.2 C) (09/26 1032) Pulse Rate:  [58-82] 70 (09/26 1032) Cardiac Rhythm:  [-] Atrial fibrillation (09/26 0708) Resp:  [15-18] 15 (09/26 1032) BP: (99-165)/(48-77) 122/48 mmHg (09/26 1032) SpO2:  [95 %-100 %] 96 % (09/26 1032)   Recent Labs Lab 12/25/14 0158  GLUCAP 130*    Recent Labs Lab 12/20/14 0300 12/21/14 1126 12/21/14 1238 12/22/14 0440 12/24/14 2200 12/24/14 2239  NA 123* 129* 130* 133* 135 133*  K 4.6 3.9 4.3 3.8 4.4 4.2  CL 90* 97* 97* 101 99* 97*  CO2 '27 28 28 28 27  '$ --   GLUCOSE 114* 112* 90 95 132* 131*  BUN 25* '16 15 11 '$ 22* 33*  CREATININE 1.32* 0.78 0.86 0.75 0.79 0.90  CALCIUM 9.0 8.9 8.9 8.6* 9.8  --     Recent Labs Lab 12/20/14 0300 12/24/14 2200  AST 29 46*  ALT 20 28  ALKPHOS 36* 65  BILITOT 0.3 1.3*  PROT 4.7* 5.9*  ALBUMIN 2.9* 3.3*    Recent Labs Lab 12/19/14 1120  12/20/14 2110 12/21/14 0355  12/22/14 0440 12/23/14 1054 12/24/14 2200 12/24/14 2239 12/26/14 0356  WBC 11.2*  < > 8.6 6.8  --  5.3 6.6 6.9  --   --   NEUTROABS 10.2*  --   --   --   --   --  5.5 5.4  --   --   HGB 9.2*  < > 8.1* 9.6*  < > 9.3* 9.9* 10.8* 11.6* 9.9*  HCT 26.8*  < > 24.0* 28.0*  < > 27.1* 29.8* 32.8* 34.0* 30.8*  MCV 98.2  < > 97.6 91.8  --  93.4 96.1 96.5  --   --   PLT 221  < > 151 146*  --  150 200 198  --   --   < > = values in this interval not displayed.  Recent Labs Lab 12/19/14 1840 12/24/14 2200  TROPONINI 0.03 <0.03    Recent Labs  12/24/14 2200  LABPROT 12.9  INR 0.95   No results for input(s): COLORURINE, LABSPEC, PHURINE, GLUCOSEU, HGBUR, BILIRUBINUR, KETONESUR, PROTEINUR,  UROBILINOGEN, NITRITE, LEUKOCYTESUR in the last 72 hours.  Invalid input(s): APPERANCEUR     Component Value Date/Time   CHOL 138 12/26/2014 0356   TRIG 94 12/26/2014 0356   HDL 55 12/26/2014 0356   CHOLHDL 2.5 12/26/2014 0356   VLDL 19 12/26/2014 0356   LDLCALC 64 12/26/2014 0356   Lab Results  Component Value Date   HGBA1C 5.5 07/20/2011   No results found for: LABOPIA, COCAINSCRNUR, LABBENZ, AMPHETMU, THCU, LABBARB  No results for input(s): ETH in the last 168 hours.  I have personally reviewed the radiological images below and agree with the radiology interpretations.  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 Cardiac enlargement. Interstitial fibrosis in the lungs. No evidence of active pulmonary disease.      Ct Head Wo Contrast  12/25/2014   IMPRESSION: No acute intracranial abnormalities. Diffuse chronic atrophy and small vessel ischemic  changes.    Carotid Doppler  Bilateral: No significant (1-39%) ICA stenosis. Antegrade vertebral flow.   2D Echocardiogram  Pending  CTA head - Calcified cavernous segment internal carotid artery with mild to slightly moderate narrowing bilaterally. Left vertebral artery is dominant. No high-grade stenosis of the distal vertebral arteries. Ectatic basilar artery with mild narrowing proximal to mid aspect.   PHYSICAL EXAM  Temp:  [98.3 F (36.8 C)-99.3 F (37.4 C)] 99 F (37.2 C) (09/26 1032) Pulse Rate:  [58-82] 70 (09/26 1032) Resp:  [15-18] 15 (09/26 1032) BP: (99-165)/(48-77) 122/48 mmHg (09/26 1032) SpO2:  [95 %-100 %] 96 % (09/26 1032)  General - Well nourished, well developed, in no apparent distress.  Ophthalmologic - Sharp disc margins OU.  Cardiovascular - irregularly irregular heart rate and rhythm.  Neck - supple, no carotid bruits  Mental Status -  Level of arousal and orientation to time,  place, and person were intact. Language including expression, naming, repetition, comprehension was assessed and found intact. Fund of Knowledge was assessed and was intact.  Cranial Nerves II - XII - II - Visual field intact OU. III, IV, VI - Extraocular movements intact. V - Facial sensation intact bilaterally. VII - Facial movement intact bilaterally. VIII - Hearing & vestibular intact bilaterally. X - Palate elevates symmetrically. XI - Chin turning & shoulder shrug intact bilaterally. XII - Tongue protrusion intact.  Motor Strength - The patient's strength was normal in all extremities and pronator drift was absent.  Bulk was normal and fasciculations were absent.   Motor Tone - Muscle tone was assessed at the neck and appendages and was normal.  Reflexes - The patient's reflexes were symmetrical in all extremities and she had no pathological reflexes.  Sensory - Light touch, temperature/pinprick were assessed and were symmetrical.    Coordination - The patient had normal movements in the hands and feet with no ataxia or dysmetria.  Tremor was absent.  Gait and Station - deferred due to fatigue.   ASSESSMENT/PLAN Deborah [name] is a 81 y.o. female with history of HTN, HLD, mechanical aortic valve repair in 1996, Afib s/p pacer, breast cancer s/p surgery, chemo, and XRT admitted for transient speech difficulty with saliva drooling on the left, recovered in seconds. Symptoms resolved. She had GIB on coumadin so she was put on lovenox since 05/2014 but developed rectal sheath hematoma for which she received blood transfusion and admitted from 12/19/14-12/22/14. Her lovenox on hold and plan to restarted coumadin tomorrow with INR goal 2.0-2.5.   TIA: possible left brain, likely secondary to embolic source as she has mechanical valve and afib not on anticoagulation.  CT no acute abnormalities  Repeat CT no acute infarct  CTA head no large vessel cut off  Carotid Doppler   unremarkable  2D Echo  pending  LDL 64  HgbA1c pending  SCDs for VTE prophylaxis  Diet Heart Room service appropriate?: Yes; Fluid consistency:: Thin   no antithrombotic prior to admission, now on ASA '81mg'$ . Plan to restart coumadin tomorrow with goal INR 2.0-2.5. D/C ASA once INR 2.0.  Patient counseled to be compliant with her antithrombotic medications  Mechanical aortic valve Afib  Holding off anticoagulation due to rectal sheath hematoma  Plan to restart coumadin tomorrow with INR goal 2.0-2.5  D/c ASA once INR 2.0  Continue follow up with Dr. Tonia Brooms after discharge.  Rectal sheath hematoma  GIB on coumadin in the past  Required blood transfusion  Anticoagulation on hold so far  On ASA 81  So far stable Hb  Plan to restart coumadin tomorrow  Follow up with GI as outpt  Hypertension  Home meds:   Norvasc, lasix, HCTZ, losartan and metoprolol BP normotensive Currently on norvasc, HCTZ, losartan and metoprolol  BP fluctuating  Patient counseled to be compliant with her blood pressure medications  Hyperlipidemia  Home meds:  zocor 10   Currently on zocor 10  LDL 64, goal < 70  Continue statin at discharge  Other Stroke Risk Factors  Advanced age  Other Active Problems  Anemia  Mild hyponatremia   Other Pertinent History  Breast cancer s/p Sx, XRT, chemo  Hospital day # 2  Neurology will sign off. Please call with questions. No neurology follow up needed at this time. Thanks for the consult.  Deborah Hawking, MD PhD Stroke Neurology 12/26/2014 10:34 AM    To contact Stroke Continuity provider, please refer to [email or web address] After hours, contact General Neurology

## 2014-12-26 NOTE — Progress Notes (Signed)
*  PRELIMINARY RESULTS* Echocardiogram 2D Echocardiogram has been performed.  Leavy Cella 12/26/2014, 2:34 PM

## 2014-12-26 NOTE — Care Management Note (Signed)
Case Management Note  Patient Details  Name: Deborah [name] MRN: [id number] Date of Birth: 1927/03/17  Subjective/Objective:                    Action/Plan: Patient admitted with dysarthria. Pt is from [facility]. Pt going to be discharged with home health PT/ aide. CM spoke with the patient and she asked to use the [facility] agency associated with [place]. CM called [facility] and they use [facility]. CM spoke with Anderson Malta with [facility] and they accepted the referral. Orders and facesheet faxed to [facility] at [facility].   Expected Discharge Date:                  Expected Discharge Plan:  [place]  In-House Referral:     Discharge planning Services  CM Consult  Post Acute Care Choice:    Choice offered to:     DME Arranged:    DME Agency:     HH Arranged:  PT, Nurse's Aide [place] Agency:  [facility] [place]  Status of Service:  In process, will continue to follow  Medicare Important Message Given:    Date Medicare IM Given:    Medicare IM give by:    Date Additional Medicare IM Given:    Additional Medicare Important Message give by:     If discussed at [place] of Stay Meetings, dates discussed:    Additional Comments:  Pollie Friar, RN 12/26/2014, 4:43 PM

## 2014-12-26 NOTE — Progress Notes (Signed)
SLP Cancellation Note  Patient Details Name: Deborah [name] MRN: [id number] DOB: 06/22/1926   Cancelled treatment:       Reason Eval/Treat Not Completed: Patient at procedure or test/unavailable (having testing done in room). Will f/u as able.   Deborah [name], M.A. CCC-SLP [phone]  Deborah [name] 12/26/2014, 1:54 PM

## 2014-12-26 NOTE — Progress Notes (Signed)
Pt discharged home to [facility]. Leaving hospital with son and daughter. Exited unit via wheelchair at [facility]. Wendee Copp

## 2014-12-26 NOTE — Consult Note (Signed)
Reason for Consult: follow up off coumadin for rectus sheath bleed   Referring Physician: Dr. Radford Pax -CARDS    PCP:  [name], MD  Primary Cardiologist:Dr. Londan Coplen is an 81 y.o. female.    Chief Complaint: pt admitted 12/24/14 for slurred speech and excessive saliva   HPI: 81 year old female with a history of previous mechanical aortic valve replacement in 1996 and she has a history of chronic atrial fibrillation and was on daily Lovenox injection in place of warfarin. When she was on warfarin she was having problems with recurrent GI bleeding requiring transfusions. Once switching to daily Lovenox injection her GI bleeding had resolved and her hemoglobin had been stable. She has a Port-A-Cath in place in her left upper chest because of poor venous access. She does not have any ischemic heart disease and she had a normal nuclear stress test in March 2012.    She was seen in ED 12/17/14 on lovenox injections ( since 05/2014) with abd. Pain. This was dx. As abd. Wall hematoma.  She was bradycardic at that time as well and BB adjusted and sent back to [facility].    On 12/19/14 pt back to ER for increasing abd pain.Marland Kitchen   Her HR was 94.  CT of abd with rectus sheath hematoma with significant blood loss anemia.  She was admitted and transfused.  Her anticoagulation was held.  At discharge it was felt to hold anticoagulation for 1 week then return to coumadin for anticoagulation with INR goal of 2-2.5.  Dr. Jana Hakim follows her Iron def. Anemia and agreed to returning to coumadin.     Pt then readmitted 12/24/14 for drooling and slurred speech.  The slurred speech resolved quickly in ER. Neuro has seen and felt this was TIA.  The plan from previous discharge was to resume coumadin 12/27/14 which is still the plan.  With INR goal of 2-2.5.    Today no complaints, waiting to go home on her echo. No further TIA symptoms.  No chest pain no SOB.  Lt rectus sheath  hematoma improving.   Past Medical History  Diagnosis Date  . Hypothyroidism   . Stomach problems     seeing Dr Watt Climes  . Hypertension   . GERD (gastroesophageal reflux disease)   . Anemia   . Arrhythmia     Atrial Fibrillation  . Breast cancer     "right; S/P lumpectomy, chemo, XRT"   . Complication of anesthesia     "once I'm awakened, I don't sleep til the following night"  . High cholesterol   . Heart murmur   . History of blood transfusion 1996; ~ 2013    "related to valve replacement; GI bleeding"   . Chronic lower GI bleeding   . Arthritis     "fingers, toes, hips; qwhere" (05/19/2013)  . Gout   . Pneumonia 07/2011    Past Surgical History  Procedure Laterality Date  . Arteriogram  01/12    NO RAS   . Cardioversion      X 2  . Cardiac catheterization    . Cataract extraction w/ intraocular lens  implant, bilateral Bilateral     bilateral cataract with lens implants  . Cholecystectomy    . Abdominal hysterectomy    . Appendectomy    . Colonoscopy with propofol N/A 11/24/2012    Procedure: COLONOSCOPY WITH PROPOFOL;  Surgeon: Jeryl Columbia, MD;  Location: [facility];  Service: Endoscopy;  Laterality: N/A;  . Breast biopsy Right   . Breast lumpectomy Right   . Aortic valve replacement  1996    St. Jude  . Cardiac valve replacement  1996    Family History  Problem Relation Age of Onset  . Dementia Mother   . Coronary artery disease Father    Social History:  reports that she quit smoking about 26 years ago. Her smoking use included Cigarettes. She has a 20 pack-year smoking history. She has never used smokeless tobacco. She reports that she drinks about 4.8 oz of alcohol per week. She reports that she does not use illicit drugs.  Allergies:  Allergies  Allergen Reactions  . Amiodarone Shortness Of Breath  . Alendronate Sodium Other (See Comments)    GI upset  . Macrodantin [Nitrofurantoin] Other (See Comments)    GI upset  . Meclizine Swelling    tongue   . Norvasc [Amlodipine Besylate] Swelling    edema  . Tussionex Pennkinetic Er [Hydrocod Polst-Cpm Polst Er] Other (See Comments)    Reaction unknown    OUTPATIENT MEDICATIONS: No current facility-administered medications on file prior to encounter.   Current Outpatient Prescriptions on File Prior to Encounter  Medication Sig Dispense Refill  . acetaminophen (TYLENOL) 500 MG tablet Take 1,000 mg by mouth every 6 (six) hours as needed for headache.    . allopurinol (ZYLOPRIM) 300 MG tablet TAKE ONE TABLET EACH DAY 30 tablet 5  . amLODipine (NORVASC) 5 MG tablet Take 1 tablet (5 mg total) by mouth 2 (two) times daily. (Patient taking differently: Take 10 mg by mouth every evening. ) 180 tablet 3  . calcium-vitamin D 500 MG tablet Take 1 tablet by mouth 2 (two) times daily.      Marland Kitchen dicyclomine (BENTYL) 10 MG capsule Take 10 mg by mouth daily as needed for spasms.     Marland Kitchen DIGOX 125 MCG tablet TAKE ONE TABLET EACH DAY (Patient taking differently: TAKE ONE TABLET EACH DAY BY MOUTH) 30 tablet 3  . fexofenadine (ALLEGRA ALLERGY) 180 MG tablet Take 180 mg by mouth daily as needed for allergies.     . furosemide (LASIX) 40 MG tablet Take 40 mg by mouth daily as needed (for swelling).    . hydrochlorothiazide (MICROZIDE) 12.5 MG capsule Take 1 capsule (12.5 mg total) by mouth 2 (two) times daily. 180 capsule 3  . levothyroxine (SYNTHROID, LEVOTHROID) 88 MCG tablet Take 88 mcg by mouth daily before breakfast.    . lidocaine-prilocaine (EMLA) cream Apply 1 application topically as needed (prior to infusion at porta cath site). 30 g 1  . losartan (COZAAR) 50 MG tablet Take 1 tablet (50 mg total) by mouth 2 (two) times daily. 60 tablet 5  . metoprolol tartrate (LOPRESSOR) 25 MG tablet Take 12.5 mg by mouth 2 (two) times daily.     . multivitamin (THERAGRAN) per tablet Take 1 tablet by mouth daily.     Marland Kitchen NITROSTAT 0.4 MG SL tablet Place 0.4 mg under the tongue every 5 (five) minutes as needed for chest pain.    1  . Omega-3 Fatty Acids (FISH OIL) 1000 MG CAPS Take 1,000 mg by mouth 2 (two) times daily.     . potassium chloride SA (K-DUR,KLOR-CON) 20 MEQ tablet Take 20 mEq by mouth daily.    . simvastatin (ZOCOR) 20 MG tablet Take 10 mg by mouth daily.    Derrill Memo ON 12/28/2014] warfarin (COUMADIN) 5 MG tablet Take 1  tablet (5 mg total) by mouth daily. 30 tablet 0  . zolpidem (AMBIEN CR) 12.5 MG CR tablet TAKE ONE TABLET AT BEDTIME AS NEEDED (Patient taking differently: TAKE ONE TABLET AT BEDTIME AS NEEDED FOR SLEEP) 30 tablet 5  . albuterol (PROVENTIL HFA;VENTOLIN HFA) 108 (90 BASE) MCG/ACT inhaler Inhale 1-2 puffs into the lungs every 6 (six) hours as needed for wheezing or shortness of breath. 1 Inhaler 2   CURRENT MEDICATIONS: Scheduled Meds: .  stroke: mapping our early stages of recovery book   Does not apply Once  . amLODipine  10 mg Oral QPM  . aspirin  81 mg Oral Daily  . calcium-vitamin D  1 tablet Oral BID WC  . digoxin  0.125 mg Oral Daily  . hydrochlorothiazide  12.5 mg Oral BID  . levothyroxine  88 mcg Oral QAC breakfast  . loratadine  10 mg Oral Daily  . losartan  50 mg Oral BID  . metoprolol tartrate  12.5 mg Oral BID  . multivitamin with minerals  1 tablet Oral Daily  . omega-3 acid ethyl esters  1 g Oral Daily  . simvastatin  10 mg Oral q1800   Continuous Infusions:  PRN Meds:.acetaminophen, albuterol, dicyclomine, hydrOXYzine, lidocaine-prilocaine, nitroGLYCERIN, zolpidem   Results for orders placed or performed during the hospital encounter of 12/24/14 (from the past 48 hour(s))  Protime-INR     Status: None   Collection Time: 12/24/14 10:00 PM  Result Value Ref Range   Prothrombin Time 12.9 11.6 - 15.2 seconds   INR 0.95 0.00 - 1.49  APTT     Status: Abnormal   Collection Time: 12/24/14 10:00 PM  Result Value Ref Range   aPTT 44 (H) 24 - 37 seconds    Comment:        IF BASELINE aPTT IS ELEVATED, SUGGEST PATIENT RISK ASSESSMENT BE USED TO DETERMINE  APPROPRIATE ANTICOAGULANT THERAPY.   CBC     Status: Abnormal   Collection Time: 12/24/14 10:00 PM  Result Value Ref Range   WBC 6.9 4.0 - 10.5 K/uL   RBC 3.40 (L) 3.87 - 5.11 MIL/uL   Hemoglobin 10.8 (L) 12.0 - 15.0 g/dL   HCT 32.8 (L) 36.0 - 46.0 %   MCV 96.5 78.0 - 100.0 fL   MCH 31.8 26.0 - 34.0 pg   MCHC 32.9 30.0 - 36.0 g/dL   RDW 19.3 (H) 11.5 - 15.5 %   Platelets 198 150 - 400 K/uL  Differential     Status: None   Collection Time: 12/24/14 10:00 PM  Result Value Ref Range   Neutrophils Relative % 78 %   Neutro Abs 5.4 1.7 - 7.7 K/uL   Lymphocytes Relative 11 %   Lymphs Abs 0.8 0.7 - 4.0 K/uL   Monocytes Relative 9 %   Monocytes Absolute 0.6 0.1 - 1.0 K/uL   Eosinophils Relative 2 %   Eosinophils Absolute 0.1 0.0 - 0.7 K/uL   Basophils Relative 0 %   Basophils Absolute 0.0 0.0 - 0.1 K/uL  Comprehensive metabolic panel     Status: Abnormal   Collection Time: 12/24/14 10:00 PM  Result Value Ref Range   Sodium 135 135 - 145 mmol/L   Potassium 4.4 3.5 - 5.1 mmol/L   Chloride 99 (L) 101 - 111 mmol/L   CO2 27 22 - 32 mmol/L   Glucose, Bld 132 (H) 65 - 99 mg/dL   BUN 22 (H) 6 - 20 mg/dL   Creatinine, Ser 0.79 0.44 - 1.00  mg/dL   Calcium 9.8 8.9 - 10.3 mg/dL   Total Protein 5.9 (L) 6.5 - 8.1 g/dL   Albumin 3.3 (L) 3.5 - 5.0 g/dL   AST 46 (H) 15 - 41 U/L   ALT 28 14 - 54 U/L   Alkaline Phosphatase 65 38 - 126 U/L   Total Bilirubin 1.3 (H) 0.3 - 1.2 mg/dL   GFR calc non Af Amer >60 >60 mL/min   GFR calc Af Amer >60 >60 mL/min    Comment: (NOTE) The eGFR has been calculated using the CKD EPI equation. This calculation has not been validated in all clinical situations. eGFR's persistently <60 mL/min signify possible Chronic Kidney Disease.    Anion gap 9 5 - 15  Troponin I     Status: None   Collection Time: 12/24/14 10:00 PM  Result Value Ref Range   Troponin I <0.03 <0.031 ng/mL    Comment:        NO INDICATION OF MYOCARDIAL INJURY.   I-stat troponin, ED  (not at [facility], [facility])     Status: None   Collection Time: 12/24/14 10:37 PM  Result Value Ref Range   Troponin i, poc 0.02 0.00 - 0.08 ng/mL   Comment 3            Comment: Due to the release kinetics of cTnI, a negative result within the first hours of the onset of symptoms does not rule out myocardial infarction with certainty. If myocardial infarction is still suspected, repeat the test at appropriate intervals.   I-Stat Chem 8, ED  (not at [facility], [facility])     Status: Abnormal   Collection Time: 12/24/14 10:39 PM  Result Value Ref Range   Sodium 133 (L) 135 - 145 mmol/L   Potassium 4.2 3.5 - 5.1 mmol/L   Chloride 97 (L) 101 - 111 mmol/L   BUN 33 (H) 6 - 20 mg/dL   Creatinine, Ser 0.90 0.44 - 1.00 mg/dL   Glucose, Bld 131 (H) 65 - 99 mg/dL   Calcium, Ion 1.24 1.13 - 1.30 mmol/L   TCO2 27 0 - 100 mmol/L   Hemoglobin 11.6 (L) 12.0 - 15.0 g/dL   HCT 34.0 (L) 36.0 - 46.0 %  CBG monitoring, ED     Status: Abnormal   Collection Time: 12/25/14  1:58 AM  Result Value Ref Range   Glucose-Capillary 130 (H) 65 - 99 mg/dL  Digoxin level     Status: Abnormal   Collection Time: 12/25/14  7:11 AM  Result Value Ref Range   Digoxin Level 0.2 (L) 0.8 - 2.0 ng/mL  Brain natriuretic peptide     Status: Abnormal   Collection Time: 12/25/14  9:30 AM  Result Value Ref Range   B Natriuretic Peptide 523.8 (H) 0.0 - 100.0 pg/mL  Hemoglobin and hematocrit, blood     Status: Abnormal   Collection Time: 12/26/14  3:56 AM  Result Value Ref Range   Hemoglobin 9.9 (L) 12.0 - 15.0 g/dL   HCT 30.8 (L) 36.0 - 46.0 %  Lipid panel     Status: None   Collection Time: 12/26/14  3:56 AM  Result Value Ref Range   Cholesterol 138 0 - 200 mg/dL   Triglycerides 94 <150 mg/dL   HDL 55 >40 mg/dL   Total CHOL/HDL Ratio 2.5 RATIO   VLDL 19 0 - 40 mg/dL   LDL Cholesterol 64 0 - 99 mg/dL    Comment:        Total Cholesterol/HDL:CHD  Risk Coronary Heart Disease Risk Table                     Men   Women  1/2 Average  Risk   3.4   3.3  Average Risk       5.0   4.4  2 X Average Risk   9.6   7.1  3 X Average Risk  23.4   11.0        Use the calculated Patient Ratio above and the CHD Risk Table to determine the patient's CHD Risk.        ATP III CLASSIFICATION (LDL):  <100     mg/dL   Optimal  100-129  mg/dL   Near or Above                    Optimal  130-159  mg/dL   Borderline  160-189  mg/dL   High  >190     mg/dL   Very High    Ct Angio Head W/cm &/or Wo Cm  12/26/2014   CLINICAL DATA:  81 year old female with stroke-like symptoms (slurred speech and dizziness). Atrial fibrillation. Subsequent encounter.  EXAM: CT ANGIOGRAPHY HEAD  TECHNIQUE: Multidetector CT imaging of the head was performed using the standard protocol during bolus administration of intravenous contrast. Multiplanar CT image reconstructions and MIPs were obtained to evaluate the vascular anatomy.  CONTRAST:  16m OMNIPAQUE IOHEXOL 350 MG/ML SOLN  COMPARISON:  12/24/2014 head CT.  FINDINGS: CT HEAD  Brain: No intracranial hemorrhage. Small vessel disease type changes without CT evidence of large acute infarct. Global atrophy without hydrocephalus. No intracranial enhancing lesion. Partially empty sella.  Calvarium and skull base: No acute abnormality.  Paranasal sinuses: Opacification right sphenoid sinus.  Orbits: Post lens replacement otherwise negative.  CTA HEAD  Anterior circulation: Calcified cavernous segment internal carotid artery bilaterally with mild to slightly moderate narrowing bilaterally. Left internal carotid artery is ectatic proximal to the skullbase. Fetal type contribution to the left posterior cerebral artery.  Posterior circulation: Left vertebral artery is dominant. No high-grade stenosis of the distal vertebral arteries. Right vertebral artery is small after takeoff of the posterior inferior cerebellar artery. Ectatic basilar artery with mild narrowing proximal to mid aspect.  Venous sinuses: Negative  Anatomic  variants: Negative  Delayed phase:Negative  IMPRESSION: Calcified cavernous segment internal carotid artery with mild to slightly moderate narrowing bilaterally.  Left vertebral artery is dominant. No high-grade stenosis of the distal vertebral arteries.  Ectatic basilar artery with mild narrowing proximal to mid aspect.   Electronically Signed   By: SGenia DelM.D.   On: 12/26/2014 12:44   Dg Chest 2 View  12/25/2014   CLINICAL DATA:  Shortness of breath and anemia. Released from hospital on 12/22/2014.  EXAM: CHEST  2 VIEW  COMPARISON:  05/22/2012  FINDINGS: Postoperative changes in the mediastinum and right axilla. 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 No pneumothorax. Diffuse cardiac enlargement. No pulmonary vascular congestion. Interstitial changes in the lungs likely represent fibrosis. No focal consolidation or airspace disease in the lungs. No blunting of costophrenic angles. No pneumothorax. Calcified and tortuous aorta.  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 Cardiac enlargement. Interstitial fibrosis in the lungs. No evidence of active pulmonary disease.   Electronically Signed   By: WGwyndolyn Saxon  Gerilyn Nestle M.D.   On: 12/25/2014 00:13   Ct Head Wo Contrast  12/25/2014   CLINICAL DATA:  Stroke-like symptoms. Drooling and slurred speech at home. Discharged home on Thursday after GI bleeding. Blood thinner stopped Saturday. Dizzy feeling  EXAM: CT HEAD WITHOUT CONTRAST  TECHNIQUE: Contiguous axial images were obtained from the base of the skull through the vertex without intravenous contrast.  COMPARISON:  03/16/2012  FINDINGS: Diffuse cerebral atrophy. Mild ventricular dilatation consistent with central atrophy. Low-attenuation changes in the deep white matter consistent with small vessel ischemia. No mass  effect or midline shift. No abnormal extra-axial fluid collections. Gray-white matter junctions are distinct. Basal cisterns are not effaced. No evidence of acute intracranial hemorrhage. No depressed skull fractures. Opacification of the right sphenoid sinus. Mastoid air cells are not opacified. Vascular calcifications.  IMPRESSION: No acute intracranial abnormalities. Diffuse chronic atrophy and small vessel ischemic changes.   Electronically Signed   By: Lucienne Capers M.D.   On: 12/25/2014 00:47    ROS: General:no colds or fevers, no specific weight changes Skin:no rashes or ulcers, + bruising of lower ext and abd. HEENT:no blurred vision, no congestion CV:see HPI PUL:see HPI GI:no diarrhea constipation or melena, no indigestion GU:no hematuria, no dysuria MS:no joint pain, no claudication Neuro:no syncope, no lightheadedness Endo:no diabetes, no thyroid disease  Blood pressure 102/42, pulse 81, temperature 98.8 F (37.1 C), temperature source Oral, resp. rate 16, height 5' (1.524 m), weight 129 lb (58.514 kg), SpO2 97 %.  Wt Readings from Last 3 Encounters:  12/24/14 129 lb (58.514 kg)  12/22/14 129 lb 12.8 oz (58.877 kg)  11/22/14 124 lb 6.4 oz (56.427 kg)    PE: General:Pleasant affect, NAD Skin:Warm and dry, brisk capillary refill HEENT:normocephalic, sclera clear, mucus membranes moist Neck:supple, no JVD, no bruits  Heart:irreg irreg  with 6-7/5 systolic murmur, [place], rub or click Lungs:clear without rales, rhonchi, or wheezes FFM:BWGY, + tenderness Lt lower quad, + BS, do not palpate liver spleen or masses Ext:no lower ext edema, + ecchymosis abd and thighs,  2+ pedal pulses, 2+ radial pulses Neuro:alert and oriented X 3, MAE, follows commands, + facial symmetry  tele:  A fib with 6 beats of WCT at 0340 possible aberrancy   Assessment/Plan Principal Problem:   Dysarthria Active Problems:   Hypothyroidism   S/P aortic valve replacement with metallic valve    Atrial fibrillation   Dyslipidemia   Iron deficiency anemia due to chronic blood loss   GI bleed   Rectus sheath hematoma   HLD (hyperlipidemia)   Essential hypertension   Gout   GERD (gastroesophageal reflux disease)   Cerebral thrombosis with cerebral infarction   TIA (transient ischemic attack)   TIA- had CTA today results above --symptoms resolved.   Chronic a fib- rate controlled on dig and lopressor- her previous bradycardia has resolved on decreased lopressor.  Hx of AVR with St. Jude mechanical valve- resume coumadin tomorrow.  She is high risk for GI bleed but on Lovenox she had rectus sheath bleed.   --she has follow up with Dr. Mare Ferrari on 12/30/14 and coumadin clinic appt.  -- echo is done results pending.   Recent rectus sheath hematoma on Lovenox.  anticoagulation was held for 1 week.  She was transfused during that hospitalization.  Anticoagulation- to begin coumadin tomorrow- she will need follow up at coumadin clinic at [facility].   Iron def. Anemia and acute bleed on the 19th.  Followed by Dr. Jana Hakim - she is usually  transfused when Hgb approaches 9.0- She receives Feraheme as needed.     [place]  Nurse Practitioner Certified [place] Pager [phone] or after 5pm or weekends call [phone] 12/26/2014, 3:43 PM     Agree with note written by Cecilie Kicks RNP  Pt with St Jude AVR, GI and rectus sheath bleed on coumadin and lovenox, admitted this time with TIA. CT neg. Carotid Dz mild. Hgb stable. Crisp VS and large resolving abd wall hematoma. OK for DC. 2D pending. To start coumadin tomorrow. Apparently we will follow in our coumadin clinic. Therapeutic window for her on lower end of the scale (2-2.5). Follow up with Dr. Mare Ferrari scheduled.  Quay Burow 12/26/2014 4:26 PM

## 2014-12-27 ENCOUNTER — Other Ambulatory Visit (HOSPITAL_BASED_OUTPATIENT_CLINIC_OR_DEPARTMENT_OTHER): Payer: Medicare Other

## 2014-12-27 ENCOUNTER — Telehealth: Payer: Self-pay | Admitting: Oncology

## 2014-12-27 ENCOUNTER — Ambulatory Visit (HOSPITAL_BASED_OUTPATIENT_CLINIC_OR_DEPARTMENT_OTHER): Payer: Medicare Other | Admitting: Oncology

## 2014-12-27 ENCOUNTER — Telehealth: Payer: Self-pay | Admitting: Cardiology

## 2014-12-27 ENCOUNTER — Other Ambulatory Visit: Payer: Self-pay | Admitting: *Deleted

## 2014-12-27 VITALS — BP 127/43 | HR 72 | Temp 97.7°F | Resp 18 | Ht 60.0 in | Wt 125.9 lb

## 2014-12-27 DIAGNOSIS — D689 Coagulation defect, unspecified: Secondary | ICD-10-CM

## 2014-12-27 DIAGNOSIS — S301XXS Contusion of abdominal wall, sequela: Secondary | ICD-10-CM

## 2014-12-27 DIAGNOSIS — Z954 Presence of other heart-valve replacement: Secondary | ICD-10-CM | POA: Diagnosis not present

## 2014-12-27 DIAGNOSIS — Z7901 Long term (current) use of anticoagulants: Secondary | ICD-10-CM

## 2014-12-27 DIAGNOSIS — K2921 Alcoholic gastritis with bleeding: Secondary | ICD-10-CM | POA: Diagnosis not present

## 2014-12-27 DIAGNOSIS — D500 Iron deficiency anemia secondary to blood loss (chronic): Secondary | ICD-10-CM | POA: Diagnosis not present

## 2014-12-27 DIAGNOSIS — K5791 Diverticulosis of intestine, part unspecified, without perforation or abscess with bleeding: Secondary | ICD-10-CM | POA: Diagnosis not present

## 2014-12-27 DIAGNOSIS — I480 Paroxysmal atrial fibrillation: Secondary | ICD-10-CM

## 2014-12-27 DIAGNOSIS — K922 Gastrointestinal hemorrhage, unspecified: Secondary | ICD-10-CM | POA: Insufficient documentation

## 2014-12-27 LAB — CBC WITH DIFFERENTIAL/PLATELET
BASO%: 0.4 % (ref 0.0–2.0)
Basophils Absolute: 0 10*3/uL (ref 0.0–0.1)
EOS%: 0.8 % (ref 0.0–7.0)
Eosinophils Absolute: 0.1 10*3/uL (ref 0.0–0.5)
HEMATOCRIT: 32.8 % — AB (ref 34.8–46.6)
HGB: 10.9 g/dL — ABNORMAL LOW (ref 11.6–15.9)
LYMPH#: 0.5 10*3/uL — AB (ref 0.9–3.3)
LYMPH%: 8.4 % — ABNORMAL LOW (ref 14.0–49.7)
MCH: 32.3 pg (ref 25.1–34.0)
MCHC: 33.4 g/dL (ref 31.5–36.0)
MCV: 96.8 fL (ref 79.5–101.0)
MONO#: 0.5 10*3/uL (ref 0.1–0.9)
MONO%: 8.5 % (ref 0.0–14.0)
NEUT%: 81.9 % — AB (ref 38.4–76.8)
NEUTROS ABS: 5.1 10*3/uL (ref 1.5–6.5)
Platelets: 241 10*3/uL (ref 145–400)
RBC: 3.39 10*6/uL — ABNORMAL LOW (ref 3.70–5.45)
RDW: 20.2 % — AB (ref 11.2–14.5)
WBC: 6.2 10*3/uL (ref 3.9–10.3)

## 2014-12-27 LAB — HEMOGLOBIN A1C
HEMOGLOBIN A1C: 5.4 % (ref 4.8–5.6)
MEAN PLASMA GLUCOSE: 108 mg/dL

## 2014-12-27 LAB — HOLD TUBE, BLOOD BANK

## 2014-12-27 NOTE — Telephone Encounter (Signed)
-----   Message from Isaiah Serge, NP sent at 12/27/2014  8:02 AM EDT ----- Please let pt know her echo was mostly stable.  Her aortic valve is more tight but Dr. Mare Ferrari will review and discuss on the 30th.   Along with her carotid dopplers that have not been read yet.  Thanks. Please let her know today.

## 2014-12-27 NOTE — Telephone Encounter (Signed)
Called patient about echo results. Per Cecilie Kicks NP, echo was mostly stable, and her aortic valve is more tight. Patient is aware of her appointment with Dr. Mare Ferrari this Friday. Patient was wondering if she should start her coumadin back today or tomorrow. Patient stated she thinks it is today, but to please have Rip Harbour call her and let her know. Will forward to [facility], Dr. Sherryl Barters nurse.

## 2014-12-27 NOTE — Addendum Note (Signed)
Addended by: [name] on: 12/27/2014 05:52 PM   Modules accepted: Orders, SmartSet

## 2014-12-27 NOTE — Progress Notes (Signed)
ID: Deborah [name]   DOB: Feb 23, 1927  MR#: [id number]  WUJ#:[id number]  PCP: Deborah Cruel, MD GYN:  SU:  OTHER MD: Deborah [name], Deborah [name]   HISTORY OF PRESENT ILLNESS: Deborah [name]") Deborah [name] is a 81 year old [place] woman referred by Deborah [name] for evaluation and treatment of iron deficiency anemia.  INTERVAL HISTORY: Deborah [name] returns today for followup of her coagulopathy accompanied by her daughter Deborah [name] and son Deborah [name]. The patient had been doing well on Lovenox, but on 12/20/2014 she developed sick be her left lower quadrant pain and was evaluated through the emergency room where she was found to have extensive ecchymosis around the left lower abdominal wall and left flank. This area was tender. CT and your showed a contained hematoma of the left sided abdominal wall musculature. This measured up to 17.5 cm. The patient was admitted, her Lovenox was discontinued and she was transfused a total of 3 units of packed cells for her acute bleeding anemia. At discharge her anticoagulation was held, with the intention of starting a week after the event  However on 12/25/2014 and the patient had a brief episode of slurred speech. She was in the kitchen with her son-in-law and noticed some salivation and drooling. She tried to say something to him but it was very unclear. Within a minute or at most 2 she was again speaking clearly and was able to state that she thought she was having a stroke. 911 was called, she was transported back to the hospital, and noncontrast head CT September 25 and CT angiography of the head 12/26/2014 showed no evidence of stroke. There was mild to slightly moderate narrowing of the internal carotids segmentally. There was no high-grade stenosis of the distal vertebral arteries. The basilar was ectatic with mild narrowing proximal to the mid aspect.  Due to the was discharged home yesterday. She is here today for reevaluation  REVIEW OF SYSTEMS: She has had no  further problems with slurred speech, and never had any unusual headaches, visual changes, or focal weakness. She continues to have development of her normocytic area of ecchymosis which now extends down both legs anteriorly. The hematoma is still palpable in the anterior chest wall and makes it look somewhat distended. She is having normal bowel movements, which are not melenic and show no bright red blood per rectum. She had a low-grade fever recently but that has resolved. She still pretty sore in the area of the hematoma and she also of course has arthritis and joint pain here and there but that is not more intense or persistent than before. She has mild sinus problems, and complaints of shortness of breath with walking any distance. She has heartburn. She feels anxious but not depressed. She is had no chest pain or pressure through all these episodes. She still feels that her speech is not quite where she wants it, but this is not apparent to her family. A detailed review of systems today was otherwise stable  PAST MEDICAL HISTORY: Past Medical History  Diagnosis Date  . Hypothyroidism   . Stomach problems     seeing Dr Deborah [name]  . Hypertension   . GERD (gastroesophageal reflux disease)   . Anemia   . Arrhythmia     Atrial Fibrillation  . Breast cancer     "right; S/P lumpectomy, chemo, XRT"   . Complication of anesthesia     "once I'm awakened, I don't sleep til the following night"  . High cholesterol   .  Heart murmur   . History of blood transfusion 1996; ~ 2013    "related to valve replacement; GI bleeding"   . Chronic lower GI bleeding   . Arthritis     "fingers, toes, hips; qwhere" (05/19/2013)  . Gout   . Pneumonia 07/2011  . Rectus sheath hematoma 12/19/14     held anticoagulation for 1 week, transfused  . TIA (transient ischemic attack) 12/24/14    symptoms resolved    PAST SURGICAL HISTORY: Past Surgical History  Procedure Laterality Date  . Arteriogram  01/12    NO RAS    . Cardioversion      X 2  . Cardiac catheterization    . Cataract extraction w/ intraocular lens  implant, bilateral Bilateral     bilateral cataract with lens implants  . Cholecystectomy    . Abdominal hysterectomy    . Appendectomy    . Colonoscopy with propofol N/A 11/24/2012    Procedure: COLONOSCOPY WITH PROPOFOL;  Surgeon: Deborah Columbia, MD;  Location: [facility];  Service: Endoscopy;  Laterality: N/A;  . Breast biopsy Right   . Breast lumpectomy Right   . Aortic valve replacement  1996    [employer]    FAMILY HISTORY Family History  Problem Relation Age of Onset  . Dementia Mother   . Coronary artery disease Father    the patient's father died from a myocardial infarction at the age of 52. The patient's mother died with Alzheimer's disease at the age of 80. She had 2 brothers and one sister. Her sister had colon cancer diagnosed at age 55. Otherwise the only other person with cancer in the family was the patient's mother's twin sister, diagnosed with breast cancer at age 35.  GYNECOLOGIC HISTORY: Menarche age 67, first live birth age 47, she is [place] P3. Underwent menopause 1972. She took hormone replacement for approximately 21 years.  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 Her daughter Deborah [name] runs the [employer] here. Daughter Deborah [name] also lives in [place], is a homemaker. Son Deborah [name] is a Scientist, research (medical). The patient has of 10 grandchildren She attends a [employer].   ADVANCED DIRECTIVES: Living will in place. Her daughter Deborah [name] is her healthcare power of attorney.  HEALTH MAINTENANCE: Social History  Substance Use Topics  . Smoking status: Former Smoker -- 1.00 packs/day for 20 years    Types: Cigarettes    Quit date: 04/01/1988  . Smokeless tobacco: Never Used  . Alcohol Use: 4.8 oz/week    4 Glasses of wine, 4 Shots of liquor per week     Comment: burbon or glass wine  daily     Allergies  Allergen Reactions  . Amiodarone Shortness Of Breath  . Alendronate Sodium Other (See Comments)    GI upset  . Macrodantin [Nitrofurantoin] Other (See Comments)    GI upset  . Meclizine Swelling    tongue  . Norvasc [Amlodipine Besylate] Swelling    edema  . Tussionex Pennkinetic Er [Hydrocod Polst-Cpm Polst Er] Other (See Comments)    Reaction unknown    Current Outpatient Prescriptions  Medication Sig Dispense Refill  . acetaminophen (TYLENOL) 500 MG tablet Take 1,000 mg by mouth every 6 (six) hours as needed for headache.    . albuterol (PROVENTIL HFA;VENTOLIN HFA) 108 (90 BASE) MCG/ACT inhaler Inhale 1-2 puffs into the lungs every 6 (six) hours as needed for wheezing or shortness of breath. 1  Inhaler 2  . allopurinol (ZYLOPRIM) 300 MG tablet TAKE ONE TABLET EACH DAY 30 tablet 5  . amLODipine (NORVASC) 5 MG tablet Take 1 tablet (5 mg total) by mouth 2 (two) times daily. (Patient taking differently: Take 10 mg by mouth every evening. ) 180 tablet 3  . aspirin 81 MG chewable tablet Chew 1 tablet (81 mg total) by mouth daily. Stop once INR reaches 2 15 tablet 0  . calcium-vitamin D 500 MG tablet Take 1 tablet by mouth 2 (two) times daily.      Marland Kitchen dicyclomine (BENTYL) 10 MG capsule Take 10 mg by mouth daily as needed for spasms.     Marland Kitchen DIGOX 125 MCG tablet TAKE ONE TABLET EACH DAY (Patient taking differently: TAKE ONE TABLET EACH DAY BY MOUTH) 30 tablet 3  . fexofenadine (ALLEGRA ALLERGY) 180 MG tablet Take 180 mg by mouth daily as needed for allergies.     . furosemide (LASIX) 40 MG tablet Take 40 mg by mouth daily as needed (for swelling).    . hydrochlorothiazide (MICROZIDE) 12.5 MG capsule Take 1 capsule (12.5 mg total) by mouth 2 (two) times daily. 180 capsule 3  . hydrOXYzine (VISTARIL) 25 MG capsule Take 1 capsule (25 mg total) by mouth 3 (three) times daily as needed. 30 capsule 0  . levothyroxine (SYNTHROID, LEVOTHROID) 88 MCG tablet Take 88 mcg by mouth  daily before breakfast.    . lidocaine-prilocaine (EMLA) cream Apply 1 application topically as needed (prior to infusion at porta cath site). 30 g 1  . losartan (COZAAR) 50 MG tablet Take 1 tablet (50 mg total) by mouth 2 (two) times daily. 60 tablet 5  . metoprolol tartrate (LOPRESSOR) 25 MG tablet Take 12.5 mg by mouth 2 (two) times daily.     . multivitamin (THERAGRAN) per tablet Take 1 tablet by mouth daily.     Marland Kitchen NITROSTAT 0.4 MG SL tablet Place 0.4 mg under the tongue every 5 (five) minutes as needed for chest pain.   1  . Omega-3 Fatty Acids (FISH OIL) 1000 MG CAPS Take 1,000 mg by mouth 2 (two) times daily.     . potassium chloride SA (K-DUR,KLOR-CON) 20 MEQ tablet Take 20 mEq by mouth daily.    . simvastatin (ZOCOR) 20 MG tablet Take 10 mg by mouth daily.    Derrill Memo ON 12/28/2014] warfarin (COUMADIN) 5 MG tablet Take 1 tablet (5 mg total) by mouth daily. 30 tablet 0  . zolpidem (AMBIEN CR) 12.5 MG CR tablet TAKE ONE TABLET AT BEDTIME AS NEEDED (Patient taking differently: TAKE ONE TABLET AT BEDTIME AS NEEDED FOR SLEEP) 30 tablet 5   No current facility-administered medications for this visit.    OBJECTIVE: Elderly white woman who appears stated age 74 Vitals:   12/27/14 1348  BP: 127/43  Pulse: 72  Temp: 97.7 F (36.5 C)  Resp: 18     Body mass index is 24.59 kg/(m^2).    ECOG FS: 1  Sclerae unicteric, pupils round, equal and small; EOMs intact Oropharynx clear and moist-- no thrush or other lesions No cervical or supraclavicular adenopathy Lungs no rales or rhonchi Heart regular rate and rhythm today Abd she has a hard palpable mass in the lower left quadrant measuring approximately 8 inches. This is slightly tender to palpation. Bowel sounds are positive MSK no focal spinal tenderness, no joint edema Neuro: Entirely nonfocal, well oriented, appropriate affect, normal speech  Breasts: Deferred Skin: Ecchymosis from the anterior abdomen across the back  down both legs  anteriorly nearly to the knee    LAB RESULTS: Results for Deborah, [name] (MRN [id number]) as of 12/27/2014 17:27  Ref. Range 10/11/2014 09:52 11/17/2014 10:01 12/09/2014 10:37 12/20/2014 03:00 12/23/2014 10:54  Ferritin Latest Ref Range: 9-269 ng/ml 107 78 64 533 (H) 719 (H)  Results for Deborah, [name] (MRN [id number]) as of 12/27/2014 17:27  Ref. Range 12/20/2014 09:30 12/20/2014 15:00 12/20/2014 21:10 12/21/2014 03:55 12/21/2014 16:23 12/22/2014 04:40 12/23/2014 10:54 12/24/2014 22:00 12/24/2014 22:39 12/26/2014 03:56 12/27/2014 13:31  Hemoglobin Latest Ref Range: 11.6-15.9 g/dL 7.8 (L) 7.6 (L) 8.1 (L) 9.6 (L) 9.9 (L) 9.3 (L) 9.9 (L) 10.8 (L) 11.6 (L) 9.9 (L) 10.9 (L)  Results for Deborah, [name] (MRN [id number]) as of 12/27/2014 17:27  Ref. Range 07/24/2012 09:00 09/24/2012 08:04 11/17/2012 12:16 01/20/2013 10:04 02/22/2013 10:42 03/16/2013 13:11 04/14/2013 09:52 06/01/2013 13:57 06/15/2013 11:07 08/18/2014 10:38 10/11/2014 09:52 11/17/2014 10:00 12/23/2014 10:54  Retic % Latest Ref Range: 0.70-2.10 % 7.49 (H) 3.20 (H) 2.94 (H) 3.51 (H) 2.00 2.30 (H) 2.60 (H) 12.41 (H) 3.31 (H) 2.22 (H) 2.19 (H) 3.23 (H) 7.30 (H)   Lab Results  Component Value Date   WBC 6.2 12/27/2014   NEUTROABS 5.1 12/27/2014   HGB 10.9* 12/27/2014   HCT 32.8* 12/27/2014   MCV 96.8 12/27/2014   PLT 241 12/27/2014      Chemistry      Component Value Date/Time   NA 133* 12/24/2014 2239   NA 132* 04/19/2014 1025   K 4.2 12/24/2014 2239   K 4.4 04/19/2014 1025   CL 97* 12/24/2014 2239   CO2 27 12/24/2014 2200   CO2 28 04/19/2014 1025   BUN 33* 12/24/2014 2239   BUN 21.9 04/19/2014 1025   CREATININE 0.90 12/24/2014 2239   CREATININE 0.7 04/19/2014 1025      Component Value Date/Time   CALCIUM 9.8 12/24/2014 2200   CALCIUM 9.6 04/19/2014 1025   ALKPHOS 65 12/24/2014 2200   ALKPHOS 64 04/19/2014 1025   AST 46* 12/24/2014 2200   AST 19 04/19/2014 1025   ALT 28 12/24/2014 2200   ALT 20 04/19/2014 1025   BILITOT 1.3* 12/24/2014 2200    BILITOT 0.30 04/19/2014 1025      No results found for: LABCA2  No components found for: LABCA125   Recent Labs Lab 12/24/14 2200  INR 0.95    Urinalysis    Component Value Date/Time   COLORURINE YELLOW 12/17/2014 1805   APPEARANCEUR CLOUDY* 12/17/2014 1805   LABSPEC 1.010 12/17/2014 1805   LABSPEC 1.010 02/08/2014 1128   PHURINE 6.0 12/17/2014 1805   PHURINE 7.5 02/08/2014 1128   GLUCOSEU NEGATIVE 12/17/2014 1805   GLUCOSEU Negative 02/08/2014 [place] 04/07/2013 1123   HGBUR TRACE* 12/17/2014 1805   HGBUR Trace 02/08/2014 [place] 12/17/2014 1805   BILIRUBINUR Negative 02/08/2014 1128   KETONESUR 15* 12/17/2014 1805   KETONESUR Negative 02/08/2014 1128   PROTEINUR 100* 12/17/2014 1805   PROTEINUR Negative 02/08/2014 1128   UROBILINOGEN 0.2 12/17/2014 1805   UROBILINOGEN 0.2 02/08/2014 1128   NITRITE NEGATIVE 12/17/2014 1805   NITRITE Negative 02/08/2014 1128   LEUKOCYTESUR TRACE* 12/17/2014 1805   LEUKOCYTESUR Negative 02/08/2014 1128   STUDIES: Ct Abdomen Pelvis Wo Contrast  12/19/2014   CLINICAL DATA:  Left abdominal pain since Saturday. Pain is getting progressively worse. Patient is on blood thinners.  EXAM: CT ABDOMEN AND PELVIS WITHOUT CONTRAST  TECHNIQUE: Multidetector CT imaging of the abdomen and  pelvis was performed following the standard protocol without IV contrast.  COMPARISON:  12/31/2011 and 11/08/2014  FINDINGS: Lung bases are clear. Mild enlargement of the heart appears stable. There is no evidence for free intraperitoneal air.  There is a large heterogeneous structure involving the left rectus sheath compatible with a hematoma. This hematoma measures 18.6 x 11.0 x 15.7 cm. Active bleeding cannot be evaluated on this non contrast examination. There is subcutaneous edema and stranding in the left anterior abdomen adjacent to the hematoma.  Again noted are innumerable renal cysts. One of the largest cysts measures up  to 6.1 cm in the left [name]. There is compression on the left [name] and left abdominal structures from the large rectus sheath hematoma.  Again noted is a 1.8 cm low-density structure in left hepatic lobe which probably represents a hepatic cyst. The gallbladder has been removed. No gross abnormality to the spleen or pancreas.  Atherosclerotic calcifications in the aorta and visceral arteries without aneurysm. There is subcutaneous edema extending into the lower anterior abdominal and pelvic subcutaneous tissues. Fluid in the urinary bladder. Uterus is surgically absent. Stable nodular structures near the vaginal cuff. No acute abnormality or dilatation to the small or large bowel. Stable mild fullness in the left adrenal gland. Normal appearance of the right adrenal gland.  There is severe disc space loss at L1-L2 with approximately 6 mm of retrolisthesis at L1-L2. Vacuum disc phenomena at T12-L1. There is extensive subchondral sclerosis and endplate changes at Z3-G6. Disease at L1-L2 is similar to the exam on 11/08/2014.  IMPRESSION: Large left-sided rectus sheath hematoma, measuring up to 18.6 cm.  Innumerable bilateral renal cysts.  Severe disc space disease at L1-L2.  These results were called by telephone at the time of interpretation on 12/19/2014 at 2:20 pm to Dr. Daleen Bo , who verbally acknowledged these results.   Electronically Signed   By: Markus Daft M.D.   On: 12/19/2014 14:21   Ct Angio Head W/cm &/or Wo Cm  12/26/2014   CLINICAL DATA:  81 year old female with stroke-like symptoms (slurred speech and dizziness). Atrial fibrillation. Subsequent encounter.  EXAM: CT ANGIOGRAPHY HEAD  TECHNIQUE: Multidetector CT imaging of the head was performed using the standard protocol during bolus administration of intravenous contrast. Multiplanar CT image reconstructions and MIPs were obtained to evaluate the vascular anatomy.  CONTRAST:  61m OMNIPAQUE IOHEXOL 350 MG/ML SOLN  COMPARISON:  12/24/2014 head  CT.  FINDINGS: CT HEAD  Brain: No intracranial hemorrhage. Small vessel disease type changes without CT evidence of large acute infarct. Global atrophy without hydrocephalus. No intracranial enhancing lesion. Partially empty sella.  Calvarium and skull base: No acute abnormality.  Paranasal sinuses: Opacification right sphenoid sinus.  Orbits: Post lens replacement otherwise negative.  CTA HEAD  Anterior circulation: Calcified cavernous segment internal carotid artery bilaterally with mild to slightly moderate narrowing bilaterally. Left internal carotid artery is ectatic proximal to the skullbase. Fetal type contribution to the left posterior cerebral artery.  Posterior circulation: Left vertebral artery is dominant. No high-grade stenosis of the distal vertebral arteries. Right vertebral artery is small after takeoff of the posterior inferior cerebellar artery. Ectatic basilar artery with mild narrowing proximal to mid aspect.  Venous sinuses: Negative  Anatomic variants: Negative  Delayed phase:Negative  IMPRESSION: Calcified cavernous segment internal carotid artery with mild to slightly moderate narrowing bilaterally.  Left vertebral artery is dominant. No high-grade stenosis of the distal vertebral arteries.  Ectatic basilar artery with mild narrowing proximal to mid aspect.  Electronically Signed   By: Genia Del M.D.   On: 12/26/2014 12:44   Dg Chest 2 View  12/25/2014   CLINICAL DATA:  Shortness of breath and anemia. Released from hospital on 12/22/2014.  EXAM: CHEST  2 VIEW  COMPARISON:  05/22/2012  FINDINGS: Postoperative changes in the mediastinum and right axilla. 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 No pneumothorax. Diffuse cardiac enlargement. No pulmonary vascular congestion. Interstitial changes in the lungs likely represent fibrosis. No focal consolidation or airspace disease in the  lungs. No blunting of costophrenic angles. No pneumothorax. Calcified and tortuous aorta.  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 Cardiac enlargement. Interstitial fibrosis in the lungs. No evidence of active pulmonary disease.   Electronically Signed   By: Lucienne Capers M.D.   On: 12/25/2014 00:13   Ct Head Wo Contrast  12/25/2014   CLINICAL DATA:  Stroke-like symptoms. Drooling and slurred speech at home. Discharged home on Thursday after GI bleeding. Blood thinner stopped Saturday. Dizzy feeling  EXAM: CT HEAD WITHOUT CONTRAST  TECHNIQUE: Contiguous axial images were obtained from the base of the skull through the vertex without intravenous contrast.  COMPARISON:  03/16/2012  FINDINGS: Diffuse cerebral atrophy. Mild ventricular dilatation consistent with central atrophy. Low-attenuation changes in the deep white matter consistent with small vessel ischemia. No mass effect or midline shift. No abnormal extra-axial fluid collections. Gray-white matter junctions are distinct. Basal cisterns are not effaced. No evidence of acute intracranial hemorrhage. No depressed skull fractures. Opacification of the right sphenoid sinus. Mastoid air cells are not opacified. Vascular calcifications.  IMPRESSION: No acute intracranial abnormalities. Diffuse chronic atrophy and small vessel ischemic changes.   Electronically Signed   By: Lucienne Capers M.D.   On: 12/25/2014 00:47   Ct Angio Abd/pel W/ And/or W/o  12/20/2014   CLINICAL DATA:  81 year old female with a history of mechanical heart valve on Lovenox injections for therapeutic anti coagulation, has developed rectus sheath hematoma. Initial CT is noncontrast, and the hematoma has grown over the course of 2-3 days. Current study has been performed to evaluate for ongoing bleeding, given the need for continued anti coagulation.  EXAM: CTA ABDOMEN AND PELVIS wITHOUT AND WITH  CONTRAST  TECHNIQUE: Multidetector CT imaging of the abdomen and pelvis was performed using the standard protocol during bolus administration of intravenous contrast. Multiplanar reconstructed images and MIPs were obtained and reviewed to evaluate the vascular anatomy.  CONTRAST:  145m OMNIPAQUE IOHEXOL 350 MG/ML SOLN  COMPARISON:  Noncontrast CT 12/19/2014  FINDINGS: Lower chest:  Surgical changes of median sternotomy.  Right atrial enlargement.  No pericardial fluid/thickening.  No lower mediastinal adenopathy.  Unremarkable appearance of the distal esophagus.  Small hiatal hernia.  Respiratory motion somewhat limits evaluation the lungs, though there is no large pleural effusion or confluent airspace disease.  Abdomen/pelvis:  Vascular:  Aorta:  Calcifications are present at the length of the abdominal aorta. No aneurysmal dilation. No dissection flap. Tortuosity descending thoracic aorta.  Mesenteric vessels:  Calcifications are present at the origin of the celiac artery, superior mesenteric artery, and the bilateral renal arteries. Calcifications and soft plaque present at the origin of the inferior mesenteric artery.  There may be 50% stenosis at the origin of the left renal artery, and there may be 50% stenosis at the origin of the inferior mesenteric artery.  Right lower extremity:  Calcifications present throughout the length of the right iliac system with tortuosity. Right hypogastric artery is patent. No aneurysm or dissection flap. Focal fusiform ectasia of the right common iliac artery measuring 12 mm.  Left lower extremity:  Atherosclerotic calcifications are present the length of the left iliac system. At the mid left common iliac artery there is circumferential soft plaquing calcification which may contribute to 50% stenosis of the common iliac artery. Hypogastric artery is either nearly occluded with reconstitution of flow or occluded. Fusiform at ectasia of the left common iliac artery measuring  11 mm.  Contained hematoma of the left-sided abdominal wall musculature. No evidence of active extravasation. Developing fluid fluid level with layering of the dense blood products at the posterior hematoma.  The axial, coronal, and sagittal images demonstrate slightly decreased diameters in all dimensions  Current: axial: 10.1 cm x 16.9 cm, coronal: 17.5 cm, sagittal 14.8 cm  Prior: Axial: 11.1 cm x 15.8 cm, coronal: 18.6 cm, sagittal: 16.3 cm  The overall volume is likely unchanged, with the size difference potentially representing redistribution.  Nonvascular:  Hypodense rounded lesion within segment 4B, compatible with benign cysts. Otherwise unremarkable appearance of the liver.  Unremarkable spleen.  Unremarkable pancreas.  Unremarkable bilateral adrenal glands.  No abnormally distended small bowel or colon. No inflammatory changes of the mesenteric. Colonic diverticular disease.  Surgical changes of cholecystectomy.  Innumerable bilateral renal cystic lesions. The majority of these are compatible with Bosniak 1 cyst. Inferior left-sided lesion with high density/calcium within the cyst on the posterior cortex measures 3.4 cm and cannot be characterized as a Bosniak 1 cyst.  No evidence of left or right-sided hydronephrosis. No left or right-sided nephrolithiasis. No ureteral stone. Unremarkable appearance of the urinary bladder wall. Likely small cystocele.  Diffuse osteopenia. No displaced fracture identified. Multilevel degenerative changes of the visualized spine. Degenerative disc disease is worst at the L1-L2 level. No focal significant bony canal narrowing.  IMPRESSION: Contained hematoma of the left-sided abdominal wall musculature with no evidence of active extravasation. Size of the hematoma is unchanged from the comparison study.  Atherosclerosis of the abdominal aorta and bilateral iliac system.  Right atrial enlargement with changes of prior median sternotomy, compatible with the patient's history  of prior valve surgery.  There may be 50% narrowing of the proximal left renal artery, as well as the mid left common iliac artery.  Innumerable bilateral renal cysts, all of which appear to be compatible with Bosniak 1 cysts and Bosniak 2 cysts.  Additional findings as above.  Signed,  Dulcy Fanny. Earleen Newport, DO  [facility]  [facility]   Electronically Signed   By: Corrie Mckusick D.O.   On: 12/20/2014 11:35    ASSESSMENT: 81 y.o. [place] with iron deficiency anemia secondary to chronic blood loss, s/p oral iron supplementation with poor tolerance and inadequate response  (1) Feraheme started 07/24/2012, repeated whenever ferritin drops <100, mosr recent dose 12/16/2014  (2) transfusing PBBCs for Hb < 9.0 or symptoms  (3) chronic GIB extensively evaluated by GI with findings including AVM (cauterized), diverticular disease, hemorrhoids and gastric polyps  (4) on lifelong anticoagulation for AFib and mechanical aortic valve  (a) on warfarin until June 2016, when she was switched to Lovenox  (b) Lovenox discontinued September 2016 after rectus sheath hematoma developed  (c) warfarin resumed 12/27/2014 goal being an INR between 2.0 and 2.5   PLAN: Deborah [name]'s reticulocytosis is impressive and her hemoglobin has climbed above  10. This is a good sign that she is no longer bleeding. She has been off anticoagulation now for over a week, and I am concerned given the recent episode of what must have been a very transient ischemic attack that she might develop another at any point.  We discussed the different risks of bleeding versus clotting. Bleeding is more dramatic of course and certainly can be life threatening but most of the time it can be controlled and managed with transfusion. Clotting however can lead to irreversible deficits particularly in case of strokes, which is the patient's main concern.  For that reason I favor resuming Lovenox now while we  recoumadinize her. She will only receive 40 mg subcutaneously and she will do this for the first 3 Coumadin days that is today and the next 2 days. On Friday she will return here for an INR. If it is therapeutic or close to therapeutic (1.9 or greater) we will stop the Lovenox at that time and continue the Coumadin at an appropriate dose.  I have discussed the INR goal with Dr. Mare Ferrari, the patient's cardiologist, and we agree it may be prudent not to push the INR too high but to try to keep it between 2.0 and 2.5. Hopefully she will not have asked frequent GI bleeds as she did previously on Coumadin at this INR range.  I don't expect the ecchymosis to clear anytime soon but eventually it will clear. We will follow the rectus sheath hematoma which hopefully will not have to be evacuated.  We will then recheck her INR on October 4, October 11, and every 2 weeks thereafter. Of course we are also checking her hemoglobin and ferritin and continuing the iron infusions and transfusions as per the prior protocol.  MAGRINAT,GUSTAV C    12/27/2014

## 2014-12-27 NOTE — Telephone Encounter (Signed)
Gave avs & calendar for October through December.

## 2014-12-27 NOTE — Telephone Encounter (Signed)
New message ° ° ° ° °Talk to Melinda °

## 2014-12-27 NOTE — Telephone Encounter (Signed)
Left message to call back Per  Dr. Mare Ferrari will have patient start Warfarin today and check CBC/INR Friday

## 2014-12-29 ENCOUNTER — Other Ambulatory Visit: Payer: Medicare Other

## 2014-12-29 NOTE — Telephone Encounter (Signed)
Spoke with patient after her ov with hematology on 9/27 and advised of recommendations

## 2014-12-30 ENCOUNTER — Ambulatory Visit (INDEPENDENT_AMBULATORY_CARE_PROVIDER_SITE_OTHER): Payer: Medicare Other | Admitting: Cardiology

## 2014-12-30 ENCOUNTER — Ambulatory Visit: Payer: Medicare Other

## 2014-12-30 ENCOUNTER — Other Ambulatory Visit (HOSPITAL_BASED_OUTPATIENT_CLINIC_OR_DEPARTMENT_OTHER): Payer: Medicare Other

## 2014-12-30 ENCOUNTER — Encounter: Payer: Self-pay | Admitting: Cardiology

## 2014-12-30 VITALS — BP 134/58 | HR 66 | Ht 60.0 in | Wt 128.4 lb

## 2014-12-30 DIAGNOSIS — I482 Chronic atrial fibrillation, unspecified: Secondary | ICD-10-CM

## 2014-12-30 DIAGNOSIS — D689 Coagulation defect, unspecified: Secondary | ICD-10-CM | POA: Diagnosis not present

## 2014-12-30 DIAGNOSIS — Z954 Presence of other heart-valve replacement: Secondary | ICD-10-CM

## 2014-12-30 DIAGNOSIS — D500 Iron deficiency anemia secondary to blood loss (chronic): Secondary | ICD-10-CM

## 2014-12-30 DIAGNOSIS — I639 Cerebral infarction, unspecified: Secondary | ICD-10-CM | POA: Diagnosis not present

## 2014-12-30 DIAGNOSIS — I4891 Unspecified atrial fibrillation: Secondary | ICD-10-CM | POA: Diagnosis not present

## 2014-12-30 DIAGNOSIS — Z7901 Long term (current) use of anticoagulants: Secondary | ICD-10-CM

## 2014-12-30 DIAGNOSIS — S301XXD Contusion of abdominal wall, subsequent encounter: Secondary | ICD-10-CM

## 2014-12-30 DIAGNOSIS — K2961 Other gastritis with bleeding: Secondary | ICD-10-CM

## 2014-12-30 LAB — COMPREHENSIVE METABOLIC PANEL (CC13)
ALBUMIN: 3.5 g/dL (ref 3.5–5.0)
ALK PHOS: 72 U/L (ref 40–150)
ALT: 23 U/L (ref 0–55)
ANION GAP: 5 meq/L (ref 3–11)
AST: 20 U/L (ref 5–34)
BILIRUBIN TOTAL: 1.01 mg/dL (ref 0.20–1.20)
BUN: 20.3 mg/dL (ref 7.0–26.0)
CALCIUM: 11 mg/dL — AB (ref 8.4–10.4)
CO2: 30 mEq/L — ABNORMAL HIGH (ref 22–29)
CREATININE: 0.8 mg/dL (ref 0.6–1.1)
Chloride: 100 mEq/L (ref 98–109)
EGFR: 71 mL/min/{1.73_m2} — ABNORMAL LOW (ref >90)
Glucose: 91 mg/dl (ref 70–140)
Potassium: 4.9 mEq/L (ref 3.5–5.1)
Sodium: 135 mEq/L — ABNORMAL LOW (ref 136–145)
Total Protein: 6.2 g/dL — ABNORMAL LOW (ref 6.4–8.3)

## 2014-12-30 LAB — PROTIME-INR
INR: 1.1 — ABNORMAL LOW (ref 2.00–3.50)
Protime: 13.2 Seconds (ref 10.6–13.4)

## 2014-12-30 LAB — CBC WITH DIFFERENTIAL/PLATELET
BASO%: 0.2 % (ref 0.0–2.0)
BASOS ABS: 0 10*3/uL (ref 0.0–0.1)
EOS%: 0.6 % (ref 0.0–7.0)
Eosinophils Absolute: 0 10*3/uL (ref 0.0–0.5)
HEMATOCRIT: 32.3 % — AB (ref 34.8–46.6)
HGB: 10.7 g/dL — ABNORMAL LOW (ref 11.6–15.9)
LYMPH#: 0.7 10*3/uL — AB (ref 0.9–3.3)
LYMPH%: 13.2 % — AB (ref 14.0–49.7)
MCH: 32.4 pg (ref 25.1–34.0)
MCHC: 33.1 g/dL (ref 31.5–36.0)
MCV: 97.9 fL (ref 79.5–101.0)
MONO#: 0.4 10*3/uL (ref 0.1–0.9)
MONO%: 7.5 % (ref 0.0–14.0)
NEUT#: 4.1 10*3/uL (ref 1.5–6.5)
NEUT%: 78.5 % — AB (ref 38.4–76.8)
PLATELETS: 235 10*3/uL (ref 145–400)
RBC: 3.3 10*6/uL — AB (ref 3.70–5.45)
RDW: 18.4 % — ABNORMAL HIGH (ref 11.2–14.5)
WBC: 5.2 10*3/uL (ref 3.9–10.3)

## 2014-12-30 LAB — FERRITIN CHCC: Ferritin: 905 ng/ml — ABNORMAL HIGH (ref 9–269)

## 2014-12-30 NOTE — Progress Notes (Signed)
Ferritin level 905- Writer spoke with Gentry Fitz, NP no action needed at this time.

## 2014-12-30 NOTE — Patient Instructions (Signed)
Medication Instructions:  Your physician recommends that you continue on your current medications as directed. Please refer to the Current Medication list given to you today.  Labwork: none  Testing/Procedures: none  Follow-Up: Your physician recommends that you schedule a follow-up appointment in: 1 month ov

## 2014-12-30 NOTE — Progress Notes (Signed)
Patient here today with her son for lab work.  Patient's INR is 1.10.  Per Dr. Virgie Dad note; " For that reason I favor resuming Lovenox now while we recoumadinize her. She will only receive 40 mg subcutaneously and she will do this for the first 3 Coumadin days that is today and the next 2 days. On Friday she will return here for an INR. If it is therapeutic or close to therapeutic (1.9 or greater) we will stop the Lovenox at that time and continue the Coumadin at an appropriate dose" "I have discussed the INR goal with Dr. Mare Ferrari, the patient's cardiologist, and we agree it may be prudent not to push the INR too high but to try to keep it between 2.0 and 2.5. Hopefully she will not have asked frequent GI bleeds as she did previously on Coumadin at this INR range." Patient did take an extra dose of lovenox- making that 4 doses instead of three this week.  Writer explained regimen once again to patient and her son.  They both stated understanding.  Patient will continue same regimen with the 3 days of lovenox starting Tuesday and coumadin 5 mg dailly.  Patient has repeat lab on Tuesday 01/03/15.  Patient did c/o of her abdominal swelling and felt that the swelling has not "gone down" yet.  She verbalizes that it is difficult to find an area of skin to grab to inject the lovenox.

## 2014-12-30 NOTE — Progress Notes (Signed)
Cardiology Office Note   Date:  12/30/2014   ID:  Deborah [name], DOB 30-May-1926, MRN [id number]  PCP:  Mathews Argyle, MD  Cardiologist: Darlin Coco MD  No chief complaint on file.     History of Present Illness: Deborah [name] is a 81 y.o. female who presents for work in visit.  She has a history of previous mechanical aortic valve replacement in 1996 and she has a history of chronic atrial fibrillation and has been on chronic anticoagulation. When she was on warfarin she was having problems with recurrent GI bleeding requiring transfusions. Once switching to daily Lovenox injection her GI bleeding has resolved and her hemoglobin has been stable. She has a Port-A-Cath in place in her left upper chest because of poor venous access. She does not have any ischemic heart disease and she had a normal nuclear stress test in March 2012. She has not been experiencing any new cardiac symptoms. She does have a past history of high blood pressure. Her blood pressure previously tended to go quite high during the night and she would have headaches. Now that she is on all 4 of her blood pressure medicines twice a day, her blood pressure has been more stable and the headaches are very infrequent. The patient has had a difficult summer.  She had been doing very well on her regimen of daily Lovenox injections for her atrial fibrillation and her mechanical aortic valve prosthesis.  However she developed a hematoma of her left flank.  This was first noted on 12/17/14.  She went to the emergency room and was evaluated and released.  2 days later her family called EMS on September 19 and she was admitted and evaluated for an enlarging hematoma.  Her anticoagulation was held except for baby aspirin because of the worsening hematoma.  She was discharged on 12/22/14.  The plan was for her to remain off her anticoagulation for a week and then resume it.  However on 12/24/14 she was readmitted because  of transient slurred speech raising the question of a TIA.  Workup during that hospitalization included an echocardiogram which showed that her systolic gradient across her prosthetic aortic valve had increased.  Her peak gradient was 99 and her mean gradient was 46.  She is not having any symptoms of severe aortic stenosis however.  She is not having any peripheral edema.  She is not having any signs of congestive heart failure.  She is not having any angina pectoris. She was released from the hospital after her questionable TIA.  She was released on 12/26/14.  She saw Dr. Jana Hakim on 12/27/14 and will both agreed that the patient should be back on anticoagulation because of her high risk of further thromboembolic events.  Her warfarin was restarted at that time and she was also placed on a half dose of Lovenox 40 mg daily to be continued until her warfarin was therapeutic.  Her goal for INR is between 2.0 and 2.5.  Past Medical History  Diagnosis Date  . Hypothyroidism   . Stomach problems     seeing Dr Watt Climes  . Hypertension   . GERD (gastroesophageal reflux disease)   . Anemia   . Arrhythmia     Atrial Fibrillation  . Breast cancer     "right; S/P lumpectomy, chemo, XRT"   . Complication of anesthesia     "once I'm awakened, I don't sleep til the following night"  . High cholesterol   . Heart  murmur   . History of blood transfusion 1996; ~ 2013    "related to valve replacement; GI bleeding"   . Chronic lower GI bleeding   . Arthritis     "fingers, toes, hips; qwhere" (05/19/2013)  . Gout   . Pneumonia 07/2011  . Rectus sheath hematoma 12/19/14     held anticoagulation for 1 week, transfused  . TIA (transient ischemic attack) 12/24/14    symptoms resolved    Past Surgical History  Procedure Laterality Date  . Arteriogram  01/12    NO RAS   . Cardioversion      X 2  . Cardiac catheterization    . Cataract extraction w/ intraocular lens  implant, bilateral Bilateral     bilateral  cataract with lens implants  . Cholecystectomy    . Abdominal hysterectomy    . Appendectomy    . Colonoscopy with propofol N/A 11/24/2012    Procedure: COLONOSCOPY WITH PROPOFOL;  Surgeon: Jeryl Columbia, MD;  Location: [facility];  Service: Endoscopy;  Laterality: N/A;  . Breast biopsy Right   . Breast lumpectomy Right   . Aortic valve replacement  1996    [employer]     Current Outpatient Prescriptions  Medication Sig Dispense Refill  . acetaminophen (TYLENOL) 500 MG tablet Take 1,000 mg by mouth every 6 (six) hours as needed for headache.    . albuterol (PROVENTIL HFA;VENTOLIN HFA) 108 (90 BASE) MCG/ACT inhaler Inhale 1-2 puffs into the lungs every 6 (six) hours as needed for wheezing or shortness of breath. 1 Inhaler 2  . allopurinol (ZYLOPRIM) 300 MG tablet Take 300 mg by mouth daily.    Marland Kitchen amLODipine (NORVASC) 5 MG tablet Take 5 mg by mouth 2 (two) times daily.    . calcium-vitamin D 500 MG tablet Take 1 tablet by mouth 2 (two) times daily.      . celecoxib (CELEBREX) 200 MG capsule Take 200 mg by mouth daily.  4  . dicyclomine (BENTYL) 10 MG capsule Take 10 mg by mouth daily as needed for spasms.     . digoxin (LANOXIN) 0.125 MG tablet Take 0.125 mg by mouth daily.    Marland Kitchen enoxaparin (LOVENOX) 100 MG/ML injection Inject 40 mg into the skin as directed.  2  . fexofenadine (ALLEGRA ALLERGY) 180 MG tablet Take 180 mg by mouth daily as needed for allergies.     . furosemide (LASIX) 40 MG tablet Take 40 mg by mouth daily as needed (for swelling).    . hydrochlorothiazide (MICROZIDE) 12.5 MG capsule Take 1 capsule (12.5 mg total) by mouth 2 (two) times daily. 180 capsule 3  . hydrOXYzine (ATARAX/VISTARIL) 25 MG tablet Take 25 mg by mouth 3 (three) times daily as needed (anxiety).    Marland Kitchen levothyroxine (SYNTHROID, LEVOTHROID) 88 MCG tablet Take 88 mcg by mouth daily before breakfast.    . lidocaine-prilocaine (EMLA) cream Apply 1 application topically as needed (prior to infusion at porta cath  site). 30 g 1  . losartan (COZAAR) 50 MG tablet Take 1 tablet (50 mg total) by mouth 2 (two) times daily. 60 tablet 5  . metoprolol tartrate (LOPRESSOR) 25 MG tablet Take 12.5 mg by mouth 2 (two) times daily.     . multivitamin (THERAGRAN) per tablet Take 1 tablet by mouth daily.     Marland Kitchen NITROSTAT 0.4 MG SL tablet Place 0.4 mg under the tongue every 5 (five) minutes as needed for chest pain.   1  . Omega-3 Fatty Acids (  FISH OIL) 1000 MG CAPS Take 1,000 mg by mouth 2 (two) times daily.     . potassium chloride SA (K-DUR,KLOR-CON) 20 MEQ tablet Take 20 mEq by mouth daily.    . simvastatin (ZOCOR) 20 MG tablet Take 10 mg by mouth daily.    Marland Kitchen warfarin (COUMADIN) 5 MG tablet Take 1 tablet (5 mg total) by mouth daily. 30 tablet 0  . zolpidem (AMBIEN CR) 12.5 MG CR tablet Take 12.5 mg by mouth at bedtime as needed for sleep (insomnia).     No current facility-administered medications for this visit.    Allergies:   Amiodarone; Alendronate sodium; Macrodantin; Meclizine; Norvasc; and Tussionex pennkinetic er    Social History:  The patient  reports that she quit smoking about 26 years ago. Her smoking use included Cigarettes. She has a 20 pack-year smoking history. She has never used smokeless tobacco. She reports that she drinks about 4.8 oz of alcohol per week. She reports that she does not use illicit drugs.   Family History:  The patient's family history includes Coronary artery disease in her father; Dementia in her mother.    ROS:  Please see the history of present illness.  She is still having significant discomfort from the slowly resolving hematoma.  She is going to try to apply moist compresses or mild heating pad and see if this will help.  Otherwise, review of systems are positive for none.   All other systems are reviewed and negative.    PHYSICAL EXAM: VS:  BP 134/58 mmHg  Pulse 66  Ht 5' (1.524 m)  Wt 128 lb 6.4 oz (58.242 kg)  BMI 25.08 kg/m2 , BMI Body mass index is 25.08  kg/(m^2). GEN: Well nourished, well developed, in no acute distress HEENT: normal Neck: no JVD, carotid bruits, or masses Cardiac: Irregularly irregular rhythm.  Grade 3/6 heart systolic ejection murmur across the prosthetic aortic valve.  No aortic insufficiency. Respiratory:  clear to auscultation bilaterally, normal work of breathing GI: soft, nontender, nondistended, + BS.  There is a large football-sized hematoma of the left lower abdominal wall with ecchymosis draining down into the legs. MS: no deformity or atrophy Skin: warm and dry, no rash Neuro:  Strength and sensation are intact Psych: euthymic mood, full affect   EKG:  EKG is not ordered today.    Recent Labs: 04/19/2014: TSH 1.915 12/25/2014: B Natriuretic Peptide 523.8* 12/30/2014: ALT 23; BUN 20.3; Creatinine 0.8; HGB 10.7*; Platelets 235; Potassium 4.9; Sodium 135*    Lipid Panel    Component Value Date/Time   CHOL 138 12/26/2014 0356   TRIG 94 12/26/2014 0356   HDL 55 12/26/2014 0356   CHOLHDL 2.5 12/26/2014 0356   VLDL 19 12/26/2014 0356   LDLCALC 64 12/26/2014 0356   LDLDIRECT 93.7 04/10/2011 1000      Wt Readings from Last 3 Encounters:  12/30/14 128 lb 6.4 oz (58.242 kg)  12/27/14 125 lb 14.4 oz (57.108 kg)  12/24/14 129 lb (58.514 kg)        ASSESSMENT AND PLAN:  1. Mechanical aortic valve replacement in 1996. 2. essential hypertension 3. chronic atrial fibrillation.  4. History of occult GI blood loss with iron deficiency anemia, followed by Dr. Watt Climes and Dr. Jana Hakim.  Hemoglobin is stable since restarting anticoagulation 5. Hypothyroidism 6. Hypercholesterolemia 7.  Recent large left lower abdominal hematoma.  Hemoglobin stable since restarting anticoagulation. 8.  Dysarthria possibly secondary to TIA occurring while she was off anticoagulation.  Plan: She is  back on warfarin for 3 days.  Her INR today is still only 1.1.  Continue daily Lovenox 40 mg injections until her INR is  therapeutic.  Her prothrombin times will be followed by Dr. Griffith Citron who will see her again next Tuesday. Recheck here in one month for office visit   Current medicines are reviewed at length with the patient today.  The patient does not have concerns regarding medicines.  The following changes have been made:  no change  Labs/ tests ordered today include:  No orders of the defined types were placed in this encounter.       Berna Spare MD 12/30/2014 5:32 PM    [address], [place], [place]  [address] Phone: [phone]; Fax: [phone]

## 2015-01-03 ENCOUNTER — Telehealth: Payer: Self-pay | Admitting: Oncology

## 2015-01-03 ENCOUNTER — Other Ambulatory Visit (HOSPITAL_BASED_OUTPATIENT_CLINIC_OR_DEPARTMENT_OTHER): Payer: Medicare Other

## 2015-01-03 DIAGNOSIS — Z7901 Long term (current) use of anticoagulants: Secondary | ICD-10-CM

## 2015-01-03 DIAGNOSIS — D689 Coagulation defect, unspecified: Secondary | ICD-10-CM | POA: Diagnosis not present

## 2015-01-03 DIAGNOSIS — I4891 Unspecified atrial fibrillation: Secondary | ICD-10-CM

## 2015-01-03 DIAGNOSIS — D500 Iron deficiency anemia secondary to blood loss (chronic): Secondary | ICD-10-CM

## 2015-01-03 DIAGNOSIS — K2961 Other gastritis with bleeding: Secondary | ICD-10-CM

## 2015-01-03 DIAGNOSIS — Z954 Presence of other heart-valve replacement: Secondary | ICD-10-CM

## 2015-01-03 LAB — CBC WITH DIFFERENTIAL/PLATELET
BASO%: 0.2 % (ref 0.0–2.0)
BASOS ABS: 0 10*3/uL (ref 0.0–0.1)
EOS ABS: 0 10*3/uL (ref 0.0–0.5)
EOS%: 0.5 % (ref 0.0–7.0)
HEMATOCRIT: 33.1 % — AB (ref 34.8–46.6)
HGB: 10.7 g/dL — ABNORMAL LOW (ref 11.6–15.9)
LYMPH%: 7 % — AB (ref 14.0–49.7)
MCH: 31.8 pg (ref 25.1–34.0)
MCHC: 32.3 g/dL (ref 31.5–36.0)
MCV: 98.5 fL (ref 79.5–101.0)
MONO#: 0.4 10*3/uL (ref 0.1–0.9)
MONO%: 6.2 % (ref 0.0–14.0)
NEUT#: 5.3 10*3/uL (ref 1.5–6.5)
NEUT%: 86.1 % — ABNORMAL HIGH (ref 38.4–76.8)
PLATELETS: 254 10*3/uL (ref 145–400)
RBC: 3.36 10*6/uL — AB (ref 3.70–5.45)
RDW: 19 % — ABNORMAL HIGH (ref 11.2–14.5)
WBC: 6.2 10*3/uL (ref 3.9–10.3)
lymph#: 0.4 10*3/uL — ABNORMAL LOW (ref 0.9–3.3)
nRBC: 0 % (ref 0–0)

## 2015-01-03 LAB — PROTIME-INR
INR: 1.6 — ABNORMAL LOW (ref 2.00–3.50)
Protime: 19.2 Seconds — ABNORMAL HIGH (ref 10.6–13.4)

## 2015-01-03 NOTE — Telephone Encounter (Signed)
s.w. pt and confirm 10.7 appt....pt ok and aware

## 2015-01-03 NOTE — Progress Notes (Signed)
Patient here today for INR which continues to be sub therapeutic at 1.6  Patient is taking 5 mg of coumadin daily with 40 mg of lovenox every morning.  Per Dr. Jana Hakim patient is to take 7.5 mg today, then '5mg'$  alternating every other day.  Patient will take her last lovenox injection 40 mg on Thursday 01/05/15 and an INR lab will be drawn on Friday.  Patient stated understanding.

## 2015-01-05 ENCOUNTER — Other Ambulatory Visit: Payer: Medicare Other

## 2015-01-06 ENCOUNTER — Other Ambulatory Visit (HOSPITAL_BASED_OUTPATIENT_CLINIC_OR_DEPARTMENT_OTHER): Payer: Medicare Other

## 2015-01-06 ENCOUNTER — Other Ambulatory Visit: Payer: Self-pay

## 2015-01-06 DIAGNOSIS — D500 Iron deficiency anemia secondary to blood loss (chronic): Secondary | ICD-10-CM

## 2015-01-06 DIAGNOSIS — D689 Coagulation defect, unspecified: Secondary | ICD-10-CM | POA: Diagnosis not present

## 2015-01-06 DIAGNOSIS — I4891 Unspecified atrial fibrillation: Secondary | ICD-10-CM

## 2015-01-06 DIAGNOSIS — Z954 Presence of other heart-valve replacement: Secondary | ICD-10-CM

## 2015-01-06 DIAGNOSIS — K2961 Other gastritis with bleeding: Secondary | ICD-10-CM

## 2015-01-06 DIAGNOSIS — Z7901 Long term (current) use of anticoagulants: Secondary | ICD-10-CM

## 2015-01-06 LAB — CBC WITH DIFFERENTIAL/PLATELET
BASO%: 0.2 % (ref 0.0–2.0)
Basophils Absolute: 0 10*3/uL (ref 0.0–0.1)
EOS ABS: 0.1 10*3/uL (ref 0.0–0.5)
EOS%: 0.9 % (ref 0.0–7.0)
HEMATOCRIT: 33.9 % — AB (ref 34.8–46.6)
HGB: 11.4 g/dL — ABNORMAL LOW (ref 11.6–15.9)
LYMPH%: 6.1 % — AB (ref 14.0–49.7)
MCH: 33 pg (ref 25.1–34.0)
MCHC: 33.6 g/dL (ref 31.5–36.0)
MCV: 98.3 fL (ref 79.5–101.0)
MONO#: 0.4 10*3/uL (ref 0.1–0.9)
MONO%: 6.3 % (ref 0.0–14.0)
NEUT%: 86.5 % — ABNORMAL HIGH (ref 38.4–76.8)
NEUTROS ABS: 5.1 10*3/uL (ref 1.5–6.5)
PLATELETS: 228 10*3/uL (ref 145–400)
RBC: 3.45 10*6/uL — ABNORMAL LOW (ref 3.70–5.45)
RDW: 19.1 % — ABNORMAL HIGH (ref 11.2–14.5)
WBC: 5.9 10*3/uL (ref 3.9–10.3)
lymph#: 0.4 10*3/uL — ABNORMAL LOW (ref 0.9–3.3)
nRBC: 0 % (ref 0–0)

## 2015-01-06 LAB — PROTIME-INR
INR: 2.6 (ref 2.00–3.50)
Protime: 31.2 Seconds — ABNORMAL HIGH (ref 10.6–13.4)

## 2015-01-06 NOTE — Progress Notes (Signed)
INR -2.6 Previous Dosing - 40 mg lovenox every morning                                5 mg/ 7.'5mg'$  every other day  New dosing per [place], [facility]                             STOP lovenox                              Continue alternating coumadin 5 mg/ 7.5 mg Patient to have repeat INR on Tuesday 01/10/15  Writer wrote out the schedule with patient and she stated an understanding.

## 2015-01-10 ENCOUNTER — Other Ambulatory Visit (HOSPITAL_COMMUNITY)
Admission: RE | Admit: 2015-01-10 | Discharge: 2015-01-10 | Disposition: A | Discharge status: Home / Self Care | Payer: Medicare Other | Source: Ambulatory Visit | Attending: Gastroenterology | Admitting: Gastroenterology

## 2015-01-10 ENCOUNTER — Other Ambulatory Visit (HOSPITAL_BASED_OUTPATIENT_CLINIC_OR_DEPARTMENT_OTHER): Payer: Medicare Other

## 2015-01-10 ENCOUNTER — Other Ambulatory Visit: Payer: Self-pay

## 2015-01-10 ENCOUNTER — Telehealth: Payer: Self-pay | Admitting: Oncology

## 2015-01-10 DIAGNOSIS — I482 Chronic atrial fibrillation, unspecified: Secondary | ICD-10-CM

## 2015-01-10 DIAGNOSIS — Z5181 Encounter for therapeutic drug level monitoring: Secondary | ICD-10-CM | POA: Insufficient documentation

## 2015-01-10 DIAGNOSIS — K2961 Other gastritis with bleeding: Secondary | ICD-10-CM

## 2015-01-10 DIAGNOSIS — Z7901 Long term (current) use of anticoagulants: Secondary | ICD-10-CM | POA: Insufficient documentation

## 2015-01-10 DIAGNOSIS — I4891 Unspecified atrial fibrillation: Secondary | ICD-10-CM | POA: Diagnosis not present

## 2015-01-10 DIAGNOSIS — Z954 Presence of other heart-valve replacement: Secondary | ICD-10-CM

## 2015-01-10 DIAGNOSIS — D689 Coagulation defect, unspecified: Secondary | ICD-10-CM

## 2015-01-10 DIAGNOSIS — D500 Iron deficiency anemia secondary to blood loss (chronic): Secondary | ICD-10-CM

## 2015-01-10 DIAGNOSIS — T148XXA Other injury of unspecified body region, initial encounter: Secondary | ICD-10-CM

## 2015-01-10 LAB — CBC WITH DIFFERENTIAL/PLATELET
BASO%: 0.4 % (ref 0.0–2.0)
Basophils Absolute: 0 10e3/uL (ref 0.0–0.1)
EOS%: 1.1 % (ref 0.0–7.0)
Eosinophils Absolute: 0.1 10e3/uL (ref 0.0–0.5)
HCT: 36.4 % (ref 34.8–46.6)
HGB: 12 g/dL (ref 11.6–15.9)
LYMPH%: 6.5 % — ABNORMAL LOW (ref 14.0–49.7)
MCH: 32.4 pg (ref 25.1–34.0)
MCHC: 32.8 g/dL (ref 31.5–36.0)
MCV: 98.8 fL (ref 79.5–101.0)
MONO#: 0.4 10e3/uL (ref 0.1–0.9)
MONO%: 7.3 % (ref 0.0–14.0)
NEUT#: 4.5 10e3/uL (ref 1.5–6.5)
NEUT%: 84.7 % — ABNORMAL HIGH (ref 38.4–76.8)
Platelets: 228 10e3/uL (ref 145–400)
RBC: 3.69 10e6/uL — ABNORMAL LOW (ref 3.70–5.45)
RDW: 20.2 % — ABNORMAL HIGH (ref 11.2–14.5)
WBC: 5.3 10e3/uL (ref 3.9–10.3)
lymph#: 0.3 10e3/uL — ABNORMAL LOW (ref 0.9–3.3)

## 2015-01-10 LAB — PROTIME-INR
INR: 4.11 — AB (ref 0.00–1.49)
PROTHROMBIN TIME: 38.8 s — AB (ref 11.6–15.2)

## 2015-01-10 NOTE — Progress Notes (Signed)
INR today- 4.11 Patient told to hold today's dose as well as tomorrows dose - 10/11,  10/12 Repeat INR-10/13 Was taking 7.5/'5mg'$  alternating days

## 2015-01-10 NOTE — Telephone Encounter (Signed)
lvm for pt for 10.13 lab.Marland KitchenMarland KitchenMarland Kitchen

## 2015-01-12 ENCOUNTER — Telehealth: Payer: Self-pay

## 2015-01-12 ENCOUNTER — Telehealth: Payer: Self-pay | Admitting: Oncology

## 2015-01-12 ENCOUNTER — Other Ambulatory Visit: Payer: Self-pay

## 2015-01-12 ENCOUNTER — Other Ambulatory Visit (HOSPITAL_BASED_OUTPATIENT_CLINIC_OR_DEPARTMENT_OTHER): Payer: Medicare Other

## 2015-01-12 DIAGNOSIS — D689 Coagulation defect, unspecified: Secondary | ICD-10-CM | POA: Diagnosis not present

## 2015-01-12 DIAGNOSIS — Z954 Presence of other heart-valve replacement: Secondary | ICD-10-CM | POA: Diagnosis not present

## 2015-01-12 DIAGNOSIS — D500 Iron deficiency anemia secondary to blood loss (chronic): Secondary | ICD-10-CM

## 2015-01-12 DIAGNOSIS — K921 Melena: Secondary | ICD-10-CM

## 2015-01-12 DIAGNOSIS — I4891 Unspecified atrial fibrillation: Secondary | ICD-10-CM

## 2015-01-12 DIAGNOSIS — K2961 Other gastritis with bleeding: Secondary | ICD-10-CM | POA: Diagnosis not present

## 2015-01-12 DIAGNOSIS — D62 Acute posthemorrhagic anemia: Secondary | ICD-10-CM

## 2015-01-12 DIAGNOSIS — Z7901 Long term (current) use of anticoagulants: Secondary | ICD-10-CM

## 2015-01-12 LAB — CBC WITH DIFFERENTIAL/PLATELET
BASO%: 0.4 % (ref 0.0–2.0)
BASOS ABS: 0 10*3/uL (ref 0.0–0.1)
EOS%: 0.5 % (ref 0.0–7.0)
Eosinophils Absolute: 0 10*3/uL (ref 0.0–0.5)
HCT: 38.2 % (ref 34.8–46.6)
HEMOGLOBIN: 12.6 g/dL (ref 11.6–15.9)
LYMPH#: 0.5 10*3/uL — AB (ref 0.9–3.3)
LYMPH%: 8.2 % — ABNORMAL LOW (ref 14.0–49.7)
MCH: 33.1 pg (ref 25.1–34.0)
MCHC: 33 g/dL (ref 31.5–36.0)
MCV: 100.3 fL (ref 79.5–101.0)
MONO#: 0.5 10*3/uL (ref 0.1–0.9)
MONO%: 8.2 % (ref 0.0–14.0)
NEUT#: 4.6 10*3/uL (ref 1.5–6.5)
NEUT%: 82.7 % — ABNORMAL HIGH (ref 38.4–76.8)
NRBC: 0 % (ref 0–0)
Platelets: 194 10*3/uL (ref 145–400)
RBC: 3.81 10*6/uL (ref 3.70–5.45)
RDW: 18.6 % — AB (ref 11.2–14.5)
WBC: 5.6 10*3/uL (ref 3.9–10.3)

## 2015-01-12 LAB — PROTIME-INR
INR: 2.9 (ref 2.00–3.50)
PROTIME: 34.8 s — AB (ref 10.6–13.4)

## 2015-01-12 NOTE — Progress Notes (Signed)
INR (after holding coumadin for 2 days)-2.9 Dosing per Dr. Jana Hakim- 5 mg daily Repeat Lab-01/18/15 Patient stated understanding.  Patient had a "dark black stool" this morning.  Her hgb was 12.6 today.  Per Dr. Jana Hakim, patient to be scheduled for a repeat CBC tomorrow.

## 2015-01-12 NOTE — Telephone Encounter (Signed)
Patient called this afternoon to report that she passed a second black stool.  Her appt is schedule for a cbc tomorrow.

## 2015-01-12 NOTE — Telephone Encounter (Signed)
Per 10/13 pof added lab appointments for 10/14 and 10/19. Left message for patient.

## 2015-01-13 ENCOUNTER — Telehealth: Payer: Self-pay | Admitting: Oncology

## 2015-01-13 ENCOUNTER — Ambulatory Visit (HOSPITAL_BASED_OUTPATIENT_CLINIC_OR_DEPARTMENT_OTHER): Payer: Medicare Other | Admitting: Oncology

## 2015-01-13 ENCOUNTER — Other Ambulatory Visit: Payer: Self-pay

## 2015-01-13 DIAGNOSIS — Z23 Encounter for immunization: Secondary | ICD-10-CM

## 2015-01-13 DIAGNOSIS — K921 Melena: Secondary | ICD-10-CM

## 2015-01-13 DIAGNOSIS — D62 Acute posthemorrhagic anemia: Secondary | ICD-10-CM

## 2015-01-13 DIAGNOSIS — Z7901 Long term (current) use of anticoagulants: Secondary | ICD-10-CM

## 2015-01-13 DIAGNOSIS — I482 Chronic atrial fibrillation, unspecified: Secondary | ICD-10-CM

## 2015-01-13 DIAGNOSIS — D500 Iron deficiency anemia secondary to blood loss (chronic): Secondary | ICD-10-CM | POA: Diagnosis not present

## 2015-01-13 LAB — CBC WITH DIFFERENTIAL/PLATELET
BASO%: 0.2 % (ref 0.0–2.0)
BASOS ABS: 0 10*3/uL (ref 0.0–0.1)
EOS%: 0.4 % (ref 0.0–7.0)
Eosinophils Absolute: 0 10*3/uL (ref 0.0–0.5)
HCT: 39 % (ref 34.8–46.6)
HGB: 12.9 g/dL (ref 11.6–15.9)
LYMPH%: 10.1 % — ABNORMAL LOW (ref 14.0–49.7)
MCH: 32.9 pg (ref 25.1–34.0)
MCHC: 33.1 g/dL (ref 31.5–36.0)
MCV: 99.5 fL (ref 79.5–101.0)
MONO#: 0.4 10*3/uL (ref 0.1–0.9)
MONO%: 7.2 % (ref 0.0–14.0)
NEUT#: 4.3 10*3/uL (ref 1.5–6.5)
NEUT%: 82.1 % — AB (ref 38.4–76.8)
Platelets: 184 10*3/uL (ref 145–400)
RBC: 3.92 10*6/uL (ref 3.70–5.45)
RDW: 18.4 % — ABNORMAL HIGH (ref 11.2–14.5)
WBC: 5.3 10*3/uL (ref 3.9–10.3)
lymph#: 0.5 10*3/uL — ABNORMAL LOW (ref 0.9–3.3)
nRBC: 0 % (ref 0–0)

## 2015-01-13 MED ORDER — INFLUENZA VAC SPLIT QUAD 0.5 ML IM SUSP: 0.5000 mL | Freq: Once | INTRAMUSCULAR | Status: DC

## 2015-01-13 MED ORDER — INFLUENZA VAC SPLIT QUAD 0.5 ML IM SUSY
0.5000 mL | PREFILLED_SYRINGE | Freq: Once | INTRAMUSCULAR | Status: AC
  Administered 2015-01-13: 0.5 mL via INTRAMUSCULAR
  Filled 2015-01-13: qty 0.5

## 2015-01-13 NOTE — Telephone Encounter (Signed)
lvm for pt regarding to OCT appt..... °

## 2015-01-13 NOTE — Progress Notes (Unsigned)
Patient here for a CBC due to two dark stools yesterday.  HGB is 12.9 today  Patient admits to having another dark black stool this morning.  Per Dr. Jana Hakim patient is to repeat cbc on Monday 01/16/15.  Patient also received her flu shot today.

## 2015-01-16 ENCOUNTER — Other Ambulatory Visit (HOSPITAL_BASED_OUTPATIENT_CLINIC_OR_DEPARTMENT_OTHER): Payer: Medicare Other

## 2015-01-16 ENCOUNTER — Other Ambulatory Visit: Payer: Self-pay

## 2015-01-16 ENCOUNTER — Telehealth: Payer: Self-pay | Admitting: Hematology and Oncology

## 2015-01-16 DIAGNOSIS — K2961 Other gastritis with bleeding: Secondary | ICD-10-CM

## 2015-01-16 DIAGNOSIS — D500 Iron deficiency anemia secondary to blood loss (chronic): Secondary | ICD-10-CM

## 2015-01-16 DIAGNOSIS — I4891 Unspecified atrial fibrillation: Secondary | ICD-10-CM | POA: Diagnosis present

## 2015-01-16 DIAGNOSIS — D689 Coagulation defect, unspecified: Secondary | ICD-10-CM

## 2015-01-16 DIAGNOSIS — Z7901 Long term (current) use of anticoagulants: Secondary | ICD-10-CM

## 2015-01-16 DIAGNOSIS — Z954 Presence of other heart-valve replacement: Secondary | ICD-10-CM

## 2015-01-16 LAB — CBC WITH DIFFERENTIAL/PLATELET
BASO%: 0.2 % (ref 0.0–2.0)
Basophils Absolute: 0 10*3/uL (ref 0.0–0.1)
EOS%: 0.4 % (ref 0.0–7.0)
Eosinophils Absolute: 0 10*3/uL (ref 0.0–0.5)
HEMATOCRIT: 36.4 % (ref 34.8–46.6)
HGB: 11.8 g/dL (ref 11.6–15.9)
LYMPH#: 0.5 10*3/uL — AB (ref 0.9–3.3)
LYMPH%: 9.7 % — ABNORMAL LOW (ref 14.0–49.7)
MCH: 32.8 pg (ref 25.1–34.0)
MCHC: 32.4 g/dL (ref 31.5–36.0)
MCV: 101.1 fL — AB (ref 79.5–101.0)
MONO#: 0.4 10*3/uL (ref 0.1–0.9)
MONO%: 7.8 % (ref 0.0–14.0)
NEUT%: 81.9 % — ABNORMAL HIGH (ref 38.4–76.8)
NEUTROS ABS: 4 10*3/uL (ref 1.5–6.5)
NRBC: 0 % (ref 0–0)
PLATELETS: 169 10*3/uL (ref 145–400)
RBC: 3.6 10*6/uL — AB (ref 3.70–5.45)
RDW: 18.2 % — AB (ref 11.2–14.5)
WBC: 4.9 10*3/uL (ref 3.9–10.3)

## 2015-01-16 LAB — PROTIME-INR
INR: 2.1 (ref 2.00–3.50)
Protime: 25.2 Seconds — ABNORMAL HIGH (ref 10.6–13.4)

## 2015-01-16 NOTE — Telephone Encounter (Signed)
Spoke with patient and she is aware of her lab and flush appointments,ok per terri to move up 11/3 to 10/31

## 2015-01-16 NOTE — Progress Notes (Signed)
Per Dr. Jana Hakim, patients PT/INR numbers are good.  She is to remain on the same dose with lab recheck in 2 weeks.  Notified patient and let her know scheduling would contact her with appt date/time.  Pt voiced understanding.  POF sent.

## 2015-01-17 ENCOUNTER — Other Ambulatory Visit: Payer: Self-pay | Admitting: Cardiology

## 2015-01-17 NOTE — Progress Notes (Signed)
Error

## 2015-01-18 ENCOUNTER — Other Ambulatory Visit: Payer: Medicare Other

## 2015-01-19 ENCOUNTER — Other Ambulatory Visit: Payer: Medicare Other

## 2015-01-19 ENCOUNTER — Other Ambulatory Visit: Payer: Self-pay

## 2015-01-19 DIAGNOSIS — I482 Chronic atrial fibrillation, unspecified: Secondary | ICD-10-CM

## 2015-01-19 DIAGNOSIS — D689 Coagulation defect, unspecified: Secondary | ICD-10-CM

## 2015-01-19 DIAGNOSIS — Z7901 Long term (current) use of anticoagulants: Secondary | ICD-10-CM

## 2015-01-19 MED ORDER — WARFARIN SODIUM 5 MG PO TABS: 5.0000 mg | ORAL_TABLET | Freq: Every day | ORAL | Status: DC

## 2015-01-24 ENCOUNTER — Other Ambulatory Visit: Payer: Medicare Other

## 2015-01-30 ENCOUNTER — Other Ambulatory Visit (HOSPITAL_BASED_OUTPATIENT_CLINIC_OR_DEPARTMENT_OTHER): Payer: Medicare Other

## 2015-01-30 ENCOUNTER — Ambulatory Visit (HOSPITAL_BASED_OUTPATIENT_CLINIC_OR_DEPARTMENT_OTHER): Payer: Medicare Other

## 2015-01-30 DIAGNOSIS — D500 Iron deficiency anemia secondary to blood loss (chronic): Secondary | ICD-10-CM

## 2015-01-30 DIAGNOSIS — D689 Coagulation defect, unspecified: Secondary | ICD-10-CM

## 2015-01-30 DIAGNOSIS — D649 Anemia, unspecified: Secondary | ICD-10-CM

## 2015-01-30 DIAGNOSIS — I4891 Unspecified atrial fibrillation: Secondary | ICD-10-CM | POA: Diagnosis not present

## 2015-01-30 DIAGNOSIS — K2961 Other gastritis with bleeding: Secondary | ICD-10-CM

## 2015-01-30 DIAGNOSIS — Z7901 Long term (current) use of anticoagulants: Secondary | ICD-10-CM

## 2015-01-30 DIAGNOSIS — Z954 Presence of other heart-valve replacement: Secondary | ICD-10-CM

## 2015-01-30 LAB — CBC WITH DIFFERENTIAL/PLATELET
BASO%: 0.2 % (ref 0.0–2.0)
BASOS ABS: 0 10*3/uL (ref 0.0–0.1)
EOS%: 1.1 % (ref 0.0–7.0)
Eosinophils Absolute: 0.1 10*3/uL (ref 0.0–0.5)
HEMATOCRIT: 36.3 % (ref 34.8–46.6)
HEMOGLOBIN: 11.9 g/dL (ref 11.6–15.9)
LYMPH%: 11 % — ABNORMAL LOW (ref 14.0–49.7)
MCH: 32.4 pg (ref 25.1–34.0)
MCHC: 32.8 g/dL (ref 31.5–36.0)
MCV: 98.9 fL (ref 79.5–101.0)
MONO#: 0.4 10*3/uL (ref 0.1–0.9)
MONO%: 6.7 % (ref 0.0–14.0)
NEUT%: 81 % — AB (ref 38.4–76.8)
NEUTROS ABS: 4.3 10*3/uL (ref 1.5–6.5)
NRBC: 0 % (ref 0–0)
PLATELETS: 183 10*3/uL (ref 145–400)
RBC: 3.67 10*6/uL — AB (ref 3.70–5.45)
RDW: 16.3 % — AB (ref 11.2–14.5)
WBC: 5.3 10*3/uL (ref 3.9–10.3)
lymph#: 0.6 10*3/uL — ABNORMAL LOW (ref 0.9–3.3)

## 2015-01-30 LAB — PROTIME-INR
INR: 2 (ref 2.00–3.50)
Protime: 24 Seconds — ABNORMAL HIGH (ref 10.6–13.4)

## 2015-01-30 MED ORDER — SODIUM CHLORIDE 0.9 % IJ SOLN
10.0000 mL | INTRAMUSCULAR | Status: DC | PRN
  Administered 2015-01-30: 10 mL via INTRAVENOUS
  Filled 2015-01-30: qty 10

## 2015-01-30 MED ORDER — HEPARIN SOD (PORK) LOCK FLUSH 100 UNIT/ML IV SOLN
500.0000 [IU] | Freq: Once | INTRAVENOUS | Status: AC
  Administered 2015-01-30: 500 [IU] via INTRAVENOUS
  Filled 2015-01-30: qty 5

## 2015-01-30 NOTE — Patient Instructions (Signed)

## 2015-01-31 ENCOUNTER — Other Ambulatory Visit: Payer: Self-pay | Admitting: Cardiology

## 2015-01-31 DIAGNOSIS — G4700 Insomnia, unspecified: Secondary | ICD-10-CM

## 2015-01-31 NOTE — Telephone Encounter (Signed)
Ok to refill [employer]

## 2015-02-01 ENCOUNTER — Ambulatory Visit (INDEPENDENT_AMBULATORY_CARE_PROVIDER_SITE_OTHER): Payer: Medicare Other | Admitting: Cardiology

## 2015-02-01 ENCOUNTER — Encounter: Payer: Self-pay | Admitting: Cardiology

## 2015-02-01 VITALS — BP 120/66 | HR 60 | Ht 60.0 in | Wt 128.8 lb

## 2015-02-01 DIAGNOSIS — S301XXD Contusion of abdominal wall, subsequent encounter: Secondary | ICD-10-CM

## 2015-02-01 DIAGNOSIS — D500 Iron deficiency anemia secondary to blood loss (chronic): Secondary | ICD-10-CM | POA: Diagnosis not present

## 2015-02-01 DIAGNOSIS — I482 Chronic atrial fibrillation, unspecified: Secondary | ICD-10-CM

## 2015-02-01 DIAGNOSIS — I639 Cerebral infarction, unspecified: Secondary | ICD-10-CM

## 2015-02-01 DIAGNOSIS — Z954 Presence of other heart-valve replacement: Secondary | ICD-10-CM

## 2015-02-01 NOTE — Telephone Encounter (Signed)
Called to [place] at [facility]

## 2015-02-01 NOTE — Progress Notes (Signed)
Cardiology Office Note   Date:  02/01/2015   ID:  Deborah [name], DOB [birth year], MRN [id number]  PCP:  Mathews Argyle, MD  Cardiologist: Darlin Coco MD  Chief Complaint  Patient presents with  . Follow-up      History of Present Illness: Deborah [name] is a 81 y.o. female who presents for follow-up office visit  She has a history of previous mechanical aortic valve replacement in 1996 and she has a history of chronic atrial fibrillation and has been on chronic anticoagulation. When she was on warfarin she was having problems with recurrent GI bleeding requiring transfusions. Once switching to daily Lovenox injection her GI bleeding has resolved and her hemoglobin has been stable. She has a Port-A-Cath in place in her left upper chest because of poor venous access. She does not have any ischemic heart disease and she had a normal nuclear stress test in March 2012. She has not been experiencing any new cardiac symptoms. She does have a past history of high blood pressure. Her blood pressure previously tended to go quite high during the night and she would have headaches. Now that she is on all 4 of her blood pressure medicines twice a day, her blood pressure has been more stable and the headaches are very infrequent. The patient has had a difficult summer. She had been doing very well on her regimen of daily Lovenox injections for her atrial fibrillation and her mechanical aortic valve prosthesis. However she developed a hematoma of her left flank. This was first noted on 12/17/14. She went to the emergency room and was evaluated and released. 2 days later her family called EMS on September 19 and she was admitted and evaluated for an enlarging hematoma. Her anticoagulation was held except for baby aspirin because of the worsening hematoma. She was discharged on 12/22/14. The plan was for her to remain off her anticoagulation for a week and then resume it. However on  12/24/14 she was readmitted because of transient slurred speech raising the question of a TIA. Workup during that hospitalization included an echocardiogram which showed that her systolic gradient across her prosthetic aortic valve had increased. Her peak gradient was 99 and her mean gradient was 46. She is not having any symptoms of severe aortic stenosis however. She is not having any peripheral edema. She is not having any signs of congestive heart failure. She is not having any angina pectoris. She was released from the hospital after her questionable TIA. She was released on 12/26/14. She saw Dr. Jana Hakim on 12/27/14 and will both agreed that the patient should be back on anticoagulation because of her high risk of further thromboembolic events. Her warfarin was restarted at that time and she was also placed on a half dose of Lovenox 40 mg daily to be continued until her warfarin was therapeutic. Her goal for INR is between 2.0 and 2.5. She has remained on warfarin.  Her most recent INR was 2.0.  She has had no further TIA symptoms.  He is not having any chest pain.  She is not having any evidence of CHF.  She rarely has to take any furosemide.  He has not been having any dizziness or syncope.  She has been ambulating on the grounds out at [facility] for exercise.  Her left flank hematoma is still present but is gradually getting softer and smaller.  Past Medical History  Diagnosis Date  . Hypothyroidism   . Stomach problems  seeing Dr Watt Climes  . Hypertension   . GERD (gastroesophageal reflux disease)   . Anemia   . Arrhythmia     Atrial Fibrillation  . Breast cancer ([place])     "right; S/P lumpectomy, chemo, XRT"   . Complication of anesthesia     "once I'm awakened, I don't sleep til the following night"  . High cholesterol   . Heart murmur   . History of blood transfusion 1996; ~ 2013    "related to valve replacement; GI bleeding"   . Chronic lower GI bleeding   . Arthritis      "fingers, toes, hips; qwhere" (05/19/2013)  . Gout   . Pneumonia 07/2011  . Rectus sheath hematoma 12/19/14     held anticoagulation for 1 week, transfused  . TIA (transient ischemic attack) 12/24/14    symptoms resolved    Past Surgical History  Procedure Laterality Date  . Arteriogram  01/12    NO RAS   . Cardioversion      X 2  . Cardiac catheterization    . Cataract extraction w/ intraocular lens  implant, bilateral Bilateral     bilateral cataract with lens implants  . Cholecystectomy    . Abdominal hysterectomy    . Appendectomy    . Colonoscopy with propofol N/A 11/24/2012    Procedure: COLONOSCOPY WITH PROPOFOL;  Surgeon: Jeryl Columbia, MD;  Location: [facility] END[facility]COPY;  Service: Endoscopy;  Laterality: N/A;  . Breast biopsy Right   . Breast lumpectomy Right   . Aortic valve replacement  1996    [employer]     Current Outpatient Prescriptions  Medication Sig Dispense Refill  . acetaminophen (TYLENOL) 500 MG tablet Take 1,000 mg by mouth every 6 (six) hours as needed for headache.    . allopurinol (ZYLOPRIM) 300 MG tablet Take 300 mg by mouth daily.    Marland Kitchen amLODipine (NORVASC) 5 MG tablet Take 5 mg by mouth 2 (two) times daily.    . calcium-vitamin D 500 MG tablet Take 1 tablet by mouth 2 (two) times daily.      . celecoxib (CELEBREX) 200 MG capsule Take 200 mg by mouth daily.  4  . dicyclomine (BENTYL) 10 MG capsule Take 10 mg by mouth daily as needed for spasms.     . digoxin (LANOXIN) 0.125 MG tablet Take 0.125 mg by mouth daily.    . fexofenadine (ALLEGRA ALLERGY) 180 MG tablet Take 180 mg by mouth daily as needed for allergies.     . furosemide (LASIX) 40 MG tablet Take 40 mg by mouth daily as needed (for swelling).    . hydrochlorothiazide (MICROZIDE) 12.5 MG capsule Take 1 capsule (12.5 mg total) by mouth 2 (two) times daily. 180 capsule 3  . hydrOXYzine (ATARAX/VISTARIL) 25 MG tablet Take 25 mg by mouth 3 (three) times daily as needed (anxiety).    . influenza vac split  quadrivalent PF (FLUARIX) 0.5 ML injection Inject 0.5 mLs into the muscle once. 0.5 mL 0  . influenza vac split quadrivalent PF (FLUARIX) 0.5 ML injection Inject 0.5 mLs into the muscle once. 0.5 mL 0  . levothyroxine (SYNTHROID, LEVOTHROID) 88 MCG tablet Take 88 mcg by mouth daily before breakfast.    . lidocaine-prilocaine (EMLA) cream Apply 1 application topically as needed (prior to infusion at porta cath site). 30 g 1  . losartan (COZAAR) 50 MG tablet Take 1 tablet (50 mg total) by mouth 2 (two) times daily. 60 tablet 5  . metoprolol  tartrate (LOPRESSOR) 25 MG tablet Take 12.5 mg by mouth 2 (two) times daily.     . multivitamin (THERAGRAN) per tablet Take 1 tablet by mouth daily.     Marland Kitchen NITROSTAT 0.4 MG SL tablet Place 0.4 mg under the tongue every 5 (five) minutes as needed for chest pain.   1  . Omega-3 Fatty Acids (FISH OIL) 1000 MG CAPS Take 1,000 mg by mouth 2 (two) times daily.     . potassium chloride SA (K-DUR,KLOR-CON) 20 MEQ tablet Take 20 mEq by mouth daily.    . simvastatin (ZOCOR) 20 MG tablet Take 10 mg by mouth daily.    Marland Kitchen warfarin (COUMADIN) 5 MG tablet Take 1 tablet (5 mg total) by mouth daily. 30 tablet 0  . zolpidem (AMBIEN CR) 12.5 MG CR tablet Take 12.5 mg by mouth at bedtime as needed for sleep (insomnia).     No current facility-administered medications for this visit.    Allergies:   Amiodarone; Alendronate sodium; Macrodantin; Meclizine; Norvasc; and Tussionex pennkinetic er    Social History:  The patient  reports that she quit smoking about 26 years ago. Her smoking use included Cigarettes. She has a 20 pack-year smoking history. She has never used smokeless tobacco. She reports that she drinks about 4.8 oz of alcohol per week. She reports that she does not use illicit drugs.   Family History:  The patient's family history includes Coronary artery disease in her father; Dementia in her mother.    ROS:  Please see the history of present illness.   Otherwise,  review of systems are positive for none.   All other systems are reviewed and negative.    PHYSICAL EXAM: VS:  BP 120/66 mmHg  Pulse 60  Ht 5' (1.524 m)  Wt 128 lb 12.8 oz (58.423 kg)  BMI 25.15 kg/m2 , BMI Body mass index is 25.15 kg/(m^2). GEN: Well nourished, well developed, in no acute distress HEENT: normal Neck: no JVD, carotid bruits, or masses Cardiac: Irregularly irregular.  There is a grade 3/6 harsh systolic ejection murmur at the aortic valve. No rubs, or gallops,no edema  Respiratory:  clear to auscultation bilaterally, normal work of breathing GI: soft, nontender, nondistended, + BS.  There is a football-sized resolving hematoma of left flank. MS: no deformity or atrophy Skin: warm and dry, no rash Neuro:  Strength and sensation are intact Psych: euthymic mood, full affect   EKG:  EKG is not ordered today.     Recent Labs: 04/19/2014: TSH 1.915 12/25/2014: B Natriuretic Peptide 523.8* 12/30/2014: ALT 23; BUN 20.3; Creatinine 0.8; Potassium 4.9; Sodium 135* 01/30/2015: HGB 11.9; Platelets 183    Lipid Panel    Component Value Date/Time   CHOL 138 12/26/2014 0356   TRIG 94 12/26/2014 0356   HDL 55 12/26/2014 0356   CHOLHDL 2.5 12/26/2014 0356   VLDL 19 12/26/2014 0356   LDLCALC 64 12/26/2014 0356   LDLDIRECT 93.7 04/10/2011 1000      Wt Readings from Last 3 Encounters:  02/01/15 128 lb 12.8 oz (58.423 kg)  12/30/14 128 lb 6.4 oz (58.242 kg)  12/27/14 125 lb 14.4 oz (57.108 kg)       ASSESSMENT AND PLAN:  1. Mechanical aortic valve replacement in 1996. 2. essential hypertension 3. chronic atrial fibrillation. She is now back on warfarin after developing a left flank hematoma while on Lovenox shots 4. History of occult GI blood loss with iron deficiency anemia, followed by Dr. Watt Climes and Dr. Jana Hakim.  5. Hypothyroidism 6. Hypercholesterolemia   Current medicines are reviewed at length with the patient today.  The patient does not have concerns  regarding medicines.  The following changes have been made:  no change  Labs/ tests ordered today include:  No orders of the defined types were placed in this encounter.    Physician: See Dr. Mare Ferrari again in about 3 months for office visit and EKG.  Continue current medications.  She gets all of her lab work at Dr. Virgie Dad office  Signed, Darlin Coco MD 02/01/2015 5:45 PM    [address], [place],   [address] Phone: [phone]; Fax: [phone]

## 2015-02-01 NOTE — Patient Instructions (Signed)
Medication Instructions:  Your physician recommends that you continue on your current medications as directed. Please refer to the Current Medication list given to you today.  Labwork: none  Testing/Procedures: none  Follow-Up: Your physician recommends that you schedule a follow-up appointment in: 3 month ov/ekg  If you need a refill on your cardiac medications before your next appointment, please call your pharmacy.

## 2015-02-02 ENCOUNTER — Other Ambulatory Visit: Payer: Medicare Other

## 2015-02-02 ENCOUNTER — Encounter: Payer: Self-pay | Admitting: *Deleted

## 2015-02-02 ENCOUNTER — Telehealth: Payer: Self-pay | Admitting: Oncology

## 2015-02-02 ENCOUNTER — Telehealth: Payer: Self-pay | Admitting: *Deleted

## 2015-02-02 NOTE — Telephone Encounter (Signed)
Per INR pt informed no change in coumadin dose and to recheck in 2 weeks.  POF sent to scheduling.

## 2015-02-02 NOTE — Telephone Encounter (Signed)
returned call and pt line busy...Marland KitchenMarland Kitchen

## 2015-02-03 ENCOUNTER — Telehealth: Payer: Self-pay | Admitting: Oncology

## 2015-02-03 NOTE — Telephone Encounter (Signed)
s.w pt and advised on cx 11.8 moved to 11.14...Marland Kitchenpt ok and aware

## 2015-02-06 ENCOUNTER — Telehealth: Payer: Self-pay | Admitting: Oncology

## 2015-02-06 NOTE — Telephone Encounter (Signed)
Returned patients call to reschedule her 11/14 lab

## 2015-02-07 ENCOUNTER — Other Ambulatory Visit: Payer: Medicare Other

## 2015-02-13 ENCOUNTER — Other Ambulatory Visit (HOSPITAL_BASED_OUTPATIENT_CLINIC_OR_DEPARTMENT_OTHER): Payer: Medicare Other

## 2015-02-13 ENCOUNTER — Telehealth: Payer: Self-pay | Admitting: Oncology

## 2015-02-13 ENCOUNTER — Other Ambulatory Visit: Payer: Medicare Other

## 2015-02-13 ENCOUNTER — Ambulatory Visit: Payer: Self-pay

## 2015-02-13 DIAGNOSIS — D500 Iron deficiency anemia secondary to blood loss (chronic): Secondary | ICD-10-CM

## 2015-02-13 DIAGNOSIS — Z7901 Long term (current) use of anticoagulants: Secondary | ICD-10-CM

## 2015-02-13 DIAGNOSIS — I4891 Unspecified atrial fibrillation: Secondary | ICD-10-CM | POA: Diagnosis not present

## 2015-02-13 DIAGNOSIS — D689 Coagulation defect, unspecified: Secondary | ICD-10-CM

## 2015-02-13 DIAGNOSIS — Z954 Presence of other heart-valve replacement: Secondary | ICD-10-CM

## 2015-02-13 DIAGNOSIS — K2961 Other gastritis with bleeding: Secondary | ICD-10-CM

## 2015-02-13 LAB — CBC WITH DIFFERENTIAL/PLATELET
BASO%: 0.2 % (ref 0.0–2.0)
BASOS ABS: 0 10*3/uL (ref 0.0–0.1)
EOS ABS: 0.1 10*3/uL (ref 0.0–0.5)
EOS%: 1.2 % (ref 0.0–7.0)
HCT: 40.7 % (ref 34.8–46.6)
HGB: 13.2 g/dL (ref 11.6–15.9)
LYMPH%: 14.5 % (ref 14.0–49.7)
MCH: 32.4 pg (ref 25.1–34.0)
MCHC: 32.4 g/dL (ref 31.5–36.0)
MCV: 99.8 fL (ref 79.5–101.0)
MONO#: 0.3 10*3/uL (ref 0.1–0.9)
MONO%: 6.4 % (ref 0.0–14.0)
NEUT#: 3.9 10*3/uL (ref 1.5–6.5)
NEUT%: 77.7 % — ABNORMAL HIGH (ref 38.4–76.8)
Platelets: 169 10*3/uL (ref 145–400)
RBC: 4.08 10*6/uL (ref 3.70–5.45)
RDW: 15.8 % — AB (ref 11.2–14.5)
WBC: 5 10*3/uL (ref 3.9–10.3)
lymph#: 0.7 10*3/uL — ABNORMAL LOW (ref 0.9–3.3)

## 2015-02-13 LAB — PROTIME-INR
INR: 2.2 (ref 2.00–3.50)
PROTIME: 26.4 s — AB (ref 10.6–13.4)

## 2015-02-13 LAB — FERRITIN CHCC: Ferritin: 446 ng/ml — ABNORMAL HIGH (ref 9–269)

## 2015-02-13 NOTE — Telephone Encounter (Signed)
Spoke with patient and she is aware of her 11/28 lab appointment

## 2015-02-13 NOTE — Progress Notes (Signed)
INR : 2.2 Present coumadin dose:  5 mg daily Repeat Lab: 2 weeks  Patient misses eating salads, broccoli and collard greens.  She knows it affects her INR however she is going to try to eat these foods a couple times a week and see how her numbers are affected.

## 2015-02-16 ENCOUNTER — Other Ambulatory Visit: Payer: Self-pay | Admitting: *Deleted

## 2015-02-16 DIAGNOSIS — I482 Chronic atrial fibrillation, unspecified: Secondary | ICD-10-CM

## 2015-02-16 DIAGNOSIS — D689 Coagulation defect, unspecified: Secondary | ICD-10-CM

## 2015-02-16 DIAGNOSIS — Z7901 Long term (current) use of anticoagulants: Secondary | ICD-10-CM

## 2015-02-16 MED ORDER — WARFARIN SODIUM 5 MG PO TABS: 5.0000 mg | ORAL_TABLET | Freq: Every day | ORAL | Status: DC

## 2015-02-17 ENCOUNTER — Telehealth: Payer: Self-pay | Admitting: Oncology

## 2015-02-17 NOTE — Telephone Encounter (Signed)
Patient called in to cancel her 11/22 as the correct date is 11/28

## 2015-02-21 ENCOUNTER — Other Ambulatory Visit: Payer: Medicare Other

## 2015-02-22 ENCOUNTER — Ambulatory Visit: Payer: Medicare Other | Admitting: Cardiology

## 2015-02-25 ENCOUNTER — Other Ambulatory Visit: Payer: Self-pay | Admitting: Cardiology

## 2015-02-27 ENCOUNTER — Other Ambulatory Visit (HOSPITAL_BASED_OUTPATIENT_CLINIC_OR_DEPARTMENT_OTHER): Payer: Medicare Other

## 2015-02-27 ENCOUNTER — Ambulatory Visit: Payer: Self-pay

## 2015-02-27 ENCOUNTER — Telehealth: Payer: Self-pay | Admitting: Cardiology

## 2015-02-27 DIAGNOSIS — Z7901 Long term (current) use of anticoagulants: Secondary | ICD-10-CM

## 2015-02-27 DIAGNOSIS — D500 Iron deficiency anemia secondary to blood loss (chronic): Secondary | ICD-10-CM

## 2015-02-27 DIAGNOSIS — K2961 Other gastritis with bleeding: Secondary | ICD-10-CM

## 2015-02-27 DIAGNOSIS — I4891 Unspecified atrial fibrillation: Secondary | ICD-10-CM | POA: Diagnosis present

## 2015-02-27 DIAGNOSIS — D689 Coagulation defect, unspecified: Secondary | ICD-10-CM

## 2015-02-27 DIAGNOSIS — Z954 Presence of other heart-valve replacement: Secondary | ICD-10-CM

## 2015-02-27 LAB — COMPREHENSIVE METABOLIC PANEL (CC13)
ALT: 30 U/L (ref 0–55)
ANION GAP: 7 meq/L (ref 3–11)
AST: 26 U/L (ref 5–34)
Albumin: 3.4 g/dL — ABNORMAL LOW (ref 3.5–5.0)
Alkaline Phosphatase: 68 U/L (ref 40–150)
BUN: 22 mg/dL (ref 7.0–26.0)
CALCIUM: 10.1 mg/dL (ref 8.4–10.4)
CHLORIDE: 97 meq/L — AB (ref 98–109)
CO2: 28 mEq/L (ref 22–29)
Creatinine: 0.8 mg/dL (ref 0.6–1.1)
EGFR: 68 mL/min/{1.73_m2} — ABNORMAL LOW (ref >90)
Glucose: 95 mg/dl (ref 70–140)
POTASSIUM: 4 meq/L (ref 3.5–5.1)
Sodium: 132 mEq/L — ABNORMAL LOW (ref 136–145)
Total Bilirubin: 0.39 mg/dL (ref 0.20–1.20)
Total Protein: 6.2 g/dL — ABNORMAL LOW (ref 6.4–8.3)

## 2015-02-27 LAB — CBC WITH DIFFERENTIAL/PLATELET
BASO%: 0.2 % (ref 0.0–2.0)
BASOS ABS: 0 10*3/uL (ref 0.0–0.1)
EOS%: 0.8 % (ref 0.0–7.0)
Eosinophils Absolute: 0 10*3/uL (ref 0.0–0.5)
HCT: 37.7 % (ref 34.8–46.6)
HEMOGLOBIN: 12.6 g/dL (ref 11.6–15.9)
LYMPH%: 7.2 % — ABNORMAL LOW (ref 14.0–49.7)
MCH: 32.9 pg (ref 25.1–34.0)
MCHC: 33.4 g/dL (ref 31.5–36.0)
MCV: 98.4 fL (ref 79.5–101.0)
MONO#: 0.4 10*3/uL (ref 0.1–0.9)
MONO%: 7.2 % (ref 0.0–14.0)
NEUT#: 4.3 10*3/uL (ref 1.5–6.5)
NEUT%: 84.6 % — ABNORMAL HIGH (ref 38.4–76.8)
Platelets: 156 10*3/uL (ref 145–400)
RBC: 3.83 10*6/uL (ref 3.70–5.45)
RDW: 14.8 % — AB (ref 11.2–14.5)
WBC: 5.1 10*3/uL (ref 3.9–10.3)
lymph#: 0.4 10*3/uL — ABNORMAL LOW (ref 0.9–3.3)

## 2015-02-27 LAB — PROTIME-INR
INR: 3.4 (ref 2.00–3.50)
PROTIME: 40.8 s — AB (ref 10.6–13.4)

## 2015-02-27 NOTE — Telephone Encounter (Signed)
I would increase the clonidine as instructed. Limit salt

## 2015-02-27 NOTE — Telephone Encounter (Signed)
Advised patient, verbalized understanding.  Call back if no better

## 2015-02-27 NOTE — Telephone Encounter (Signed)
Patients medications reviewed. She is only taking Amlodipine 5 mg daily, unable to take any more secondary to swelling. Spoke with patient and she saw Dr Felipa Eth last week and her blood pressure was elevated.  Dr Felipa Eth added Clonidine 0.1 mg twice a day This did not help her blood pressure and had her monitor checked with nurse where she lives She did go to walk in clinic Friday and they increased her Clonidine to 0.1 mg three times a day for one 1 week and then wanted her to go to 2 tablets twice a day. Blood pressure still running 180's/100's prior to am medications.  Today's blood pressure was down to 168/88 at the [facility] but they advised her to call her cardiologist.  Will forward to Tera Helper NP for review

## 2015-02-27 NOTE — Telephone Encounter (Signed)
Sent to [employer]

## 2015-02-27 NOTE — Progress Notes (Signed)
INR- 3.4 Present Coumadin Dose- 5 mg daily New Coumadin Dose- patient will HOLD dose today, alternation doses starting tomorrow 2.5 mg, then 5 mg.  Wrote a schedule out for patient who stated understanding of dose changes. Repeat INR next Monday 03/06/15

## 2015-02-27 NOTE — Telephone Encounter (Signed)
New message    Pt C/O BP issue: STAT if pt C/O blurred vision, one-sided weakness or slurred speech  1. What are your last 5 BP readings? Today 189/100 , yesterday same   2. Are you having any other symptoms (ex. Dizziness, headache, blurred vision, passed out)? lightheaded    3. What is your BP issue? Wants to discuss with nurse

## 2015-03-02 ENCOUNTER — Telehealth: Payer: Self-pay | Admitting: *Deleted

## 2015-03-02 ENCOUNTER — Telehealth: Payer: Self-pay | Admitting: Oncology

## 2015-03-02 NOTE — Telephone Encounter (Signed)
Voicemail: "No one has called me with appointment time for Monday 03-06-2015.  I also know Monday equals five weeks since my port-s-cath was flushed.  Do I need this done.  Please call me at [phone]."  Called to verify if she wants all labs drawn from port-a-cath and if she is aware of the appointment 03-07-2015 at 10:15.    "No not  If all that's needed is blood draw, I do not want port used every time.  My labs were so bad 02-27-2015, Deborah [name] was to schedule lab appointment for next Monday."  Will send p.o.f.

## 2015-03-02 NOTE — Telephone Encounter (Signed)
s.w. pt and and advised on 12.5 appt....pt ok and aware

## 2015-03-06 ENCOUNTER — Ambulatory Visit (HOSPITAL_BASED_OUTPATIENT_CLINIC_OR_DEPARTMENT_OTHER): Payer: Medicare Other

## 2015-03-06 ENCOUNTER — Telehealth: Payer: Self-pay | Admitting: Cardiology

## 2015-03-06 ENCOUNTER — Ambulatory Visit: Payer: Self-pay

## 2015-03-06 ENCOUNTER — Other Ambulatory Visit (HOSPITAL_BASED_OUTPATIENT_CLINIC_OR_DEPARTMENT_OTHER): Payer: Medicare Other

## 2015-03-06 ENCOUNTER — Other Ambulatory Visit: Payer: Self-pay | Admitting: *Deleted

## 2015-03-06 ENCOUNTER — Telehealth: Payer: Self-pay | Admitting: Oncology

## 2015-03-06 DIAGNOSIS — D649 Anemia, unspecified: Secondary | ICD-10-CM

## 2015-03-06 DIAGNOSIS — I4891 Unspecified atrial fibrillation: Secondary | ICD-10-CM

## 2015-03-06 DIAGNOSIS — D500 Iron deficiency anemia secondary to blood loss (chronic): Secondary | ICD-10-CM | POA: Diagnosis present

## 2015-03-06 DIAGNOSIS — K2961 Other gastritis with bleeding: Secondary | ICD-10-CM

## 2015-03-06 DIAGNOSIS — Z7901 Long term (current) use of anticoagulants: Secondary | ICD-10-CM

## 2015-03-06 DIAGNOSIS — D689 Coagulation defect, unspecified: Secondary | ICD-10-CM

## 2015-03-06 DIAGNOSIS — Z954 Presence of other heart-valve replacement: Secondary | ICD-10-CM

## 2015-03-06 LAB — PROTIME-INR
INR: 1.7 — ABNORMAL LOW (ref 2.00–3.50)
PROTIME: 20.4 s — AB (ref 10.6–13.4)

## 2015-03-06 LAB — CBC WITH DIFFERENTIAL/PLATELET
BASO%: 0.5 % (ref 0.0–2.0)
BASOS ABS: 0 10*3/uL (ref 0.0–0.1)
EOS%: 0.7 % (ref 0.0–7.0)
Eosinophils Absolute: 0 10*3/uL (ref 0.0–0.5)
HCT: 37.1 % (ref 34.8–46.6)
HGB: 12.5 g/dL (ref 11.6–15.9)
LYMPH#: 0.5 10*3/uL — AB (ref 0.9–3.3)
LYMPH%: 9.9 % — AB (ref 14.0–49.7)
MCH: 32.6 pg (ref 25.1–34.0)
MCHC: 33.6 g/dL (ref 31.5–36.0)
MCV: 97.2 fL (ref 79.5–101.0)
MONO#: 0.3 10*3/uL (ref 0.1–0.9)
MONO%: 6.7 % (ref 0.0–14.0)
NEUT%: 82.2 % — ABNORMAL HIGH (ref 38.4–76.8)
NEUTROS ABS: 3.8 10*3/uL (ref 1.5–6.5)
PLATELETS: 197 10*3/uL (ref 145–400)
RBC: 3.82 10*6/uL (ref 3.70–5.45)
RDW: 15.3 % — ABNORMAL HIGH (ref 11.2–14.5)
WBC: 4.6 10*3/uL (ref 3.9–10.3)

## 2015-03-06 MED ORDER — DIGOXIN 125 MCG PO TABS: 0.1250 mg | ORAL_TABLET | Freq: Every day | ORAL | Status: DC

## 2015-03-06 MED ORDER — SODIUM CHLORIDE 0.9 % IJ SOLN
10.0000 mL | INTRAMUSCULAR | Status: DC | PRN
  Administered 2015-03-06: 10 mL via INTRAVENOUS
  Filled 2015-03-06: qty 10

## 2015-03-06 MED ORDER — HEPARIN SOD (PORK) LOCK FLUSH 100 UNIT/ML IV SOLN
500.0000 [IU] | Freq: Once | INTRAVENOUS | Status: AC
  Administered 2015-03-06: 500 [IU] via INTRAVENOUS
  Filled 2015-03-06: qty 5

## 2015-03-06 NOTE — Telephone Encounter (Signed)
Called patient and she is aware of her  12/12 lab

## 2015-03-06 NOTE — Progress Notes (Signed)
INR Today - 1.7 Present coumadin dose - 2.5 mg/ 5 mg alternating days New Dose per Dr. Jana Hakim- 5 mg every day except Thursday and Saturdays Lab scheduled in one week 03/13/15 Patient stated understanding

## 2015-03-06 NOTE — Telephone Encounter (Signed)
Pt states that her heart rate was 46-48 at [facility] earlier today.  Pt states her BP was around 136/80s, she does not recall exact number. Pt states that her BP has been high recently, her clonidine was increased from 0.'1mg'$  tid to 2 of a 01.mg tablet (0.'2mg'$ )  bid on Friday 03/03/15.  Pt states she feels weak and SOB. Pt states she has been taking metoprolol tartrate '25mg'$  bid, not 12.'5mg'$  bid as medication list indicates.  I reviewed with Truitt Merle, NP: Per Lori--decrease metoprolol tartrate to 12.'5mg'$  bid, monitor heart rate and BP, schedule follow up appt with Dr Mare Ferrari next week.  Pt advised, verbalized understanding.   Pt advised to seek emergency care if her symptoms do not improve. Pt advised I will forward to [facility] to follow up with her about appt with Dr Mare Ferrari next week.

## 2015-03-06 NOTE — Telephone Encounter (Signed)
DIGOX 125 MCG tablet 30 tablet 3 10/31/2014 12/30/2014     Sig:  TAKE ONE TABLET EACH DAY    Class:  Normal    DAW:  No    Authorizing Provider:  Darlin Coco, MD    Ordering User:  Derl Barrow

## 2015-03-06 NOTE — Telephone Encounter (Signed)
Follow up      *STAT* If patient is at the pharmacy, call can be transferred to refill team.   1. Which medications need to be refilled? (please list name of each medication and dose if known) digoxin .'125mg'$   2. Which pharmacy/location (including street and city if local pharmacy) is medication to be sent to? [facility]  3. Do they need a 30 day or 90 day supply? 30 day--  Last filled on 02-03-15

## 2015-03-06 NOTE — Telephone Encounter (Signed)
New problem    Pt stated her hr is 43 and need to speak to nurse.

## 2015-03-06 NOTE — Patient Instructions (Signed)

## 2015-03-07 ENCOUNTER — Ambulatory Visit (INDEPENDENT_AMBULATORY_CARE_PROVIDER_SITE_OTHER): Payer: Medicare Other | Admitting: Physician Assistant

## 2015-03-07 ENCOUNTER — Telehealth: Payer: Self-pay | Admitting: Cardiology

## 2015-03-07 ENCOUNTER — Encounter: Payer: Self-pay | Admitting: Physician Assistant

## 2015-03-07 ENCOUNTER — Other Ambulatory Visit: Payer: Medicare Other

## 2015-03-07 VITALS — BP 172/72 | HR 67 | Ht 60.0 in | Wt 128.8 lb

## 2015-03-07 DIAGNOSIS — I639 Cerebral infarction, unspecified: Secondary | ICD-10-CM

## 2015-03-07 DIAGNOSIS — Z952 Presence of prosthetic heart valve: Secondary | ICD-10-CM

## 2015-03-07 DIAGNOSIS — I10 Essential (primary) hypertension: Secondary | ICD-10-CM

## 2015-03-07 DIAGNOSIS — R079 Chest pain, unspecified: Secondary | ICD-10-CM

## 2015-03-07 DIAGNOSIS — I482 Chronic atrial fibrillation, unspecified: Secondary | ICD-10-CM

## 2015-03-07 DIAGNOSIS — R001 Bradycardia, unspecified: Secondary | ICD-10-CM

## 2015-03-07 DIAGNOSIS — Z954 Presence of other heart-valve replacement: Secondary | ICD-10-CM | POA: Diagnosis not present

## 2015-03-07 DIAGNOSIS — R0602 Shortness of breath: Secondary | ICD-10-CM

## 2015-03-07 DIAGNOSIS — E785 Hyperlipidemia, unspecified: Secondary | ICD-10-CM

## 2015-03-07 DIAGNOSIS — Z8719 Personal history of other diseases of the digestive system: Secondary | ICD-10-CM

## 2015-03-07 MED ORDER — DIGOXIN 125 MCG PO TABS: 0.1250 mg | ORAL_TABLET | ORAL | Status: DC

## 2015-03-07 NOTE — Telephone Encounter (Signed)
Spoke with patient and she went to see [facility] nurse this am since her blood pressure has been elevated and heart rate low Blood pressure 165/80 and heart rate 56 without her morning Metoprolol.  Metoprolol decreased via phone yesterday Heart rate has been running in 40's-50's  Patient stated she just did not feel well, fatigue and shortness of breath When patient saw [facility] nurse they felt she should see her cardiologist today  Dr. Mare Ferrari out of the office so scheduled ov with Brynda Rim PA Patient aware of appointment

## 2015-03-07 NOTE — Patient Instructions (Addendum)
Medication Instructions:  1. CONTINUE TO HOLD METOPROLOL 2. CHANGE DIGOXIN TO TAKING ON MON, WED AND FRI'S 3. FOR THE NEXT 3 DAYS TAKE LASIX 40 MG DAILY THEN RESUME AS NEEDED 4. FOR THE NEXT 3 DAYS TAKE AN EXTRA POTASSIUM 20 MEQ; AFTER THE 3 DAYS YOU WILL RESUME POTASSIUM 20 MEQ DAILY  Labwork: 1. TODAY BNP  2. BMET, BNP TO BE DONE IN 1 WEEK  Testing/Procedures: 1. Your physician has requested that you have an echocardiogram. Echocardiography is a painless test that uses sound waves to create images of your heart. It provides your doctor with information about the size and shape of your heart and how well your heart's chambers and valves are working. This procedure takes approximately one hour. There are no restrictions for this procedure.  2. Your physician has recommended that you wear a 48 HOUR holter monitor. Holter monitors are medical devices that record the heart's electrical activity. Doctors most often use these monitors to diagnose arrhythmias. Arrhythmias are problems with the speed or rhythm of the heartbeat. The monitor is a small, portable device. You can wear one while you do your normal daily activities. This is usually used to diagnose what is causing palpitations/syncope (passing out).  3. Your physician has requested that you have a lexiscan myoview. For further information please visit [employer]. Please follow instruction sheet, as given.  Follow-Up: DR BRACKBILL NEXT WEEK PER SCOTT WEAVER, PAC; I WILL HAVE MELINDA, DR. Bobbye Riggs NURSE CALL YOU WITH AN APPT  Any Other Special Instructions Will Be Listed Below (If Applicable).  CALL THE OFFICE IF YOUR HEART RATE GOES ABOVE 90 THIS WEEK   If you need a refill on your cardiac medications before your next appointment, please call your pharmacy.

## 2015-03-07 NOTE — Progress Notes (Signed)
Cardiology Office Note   Date:  03/07/2015   ID:  Deborah [name], DOB [birth year], MRN [id number]   Patient Care Team: Lajean Manes, MD as PCP - General (Internal Medicine) Darlin Coco, MD as Consulting Physician (Cardiology)    Chief Complaint  Patient presents with  . Bradycardia  . Shortness of Breath     History of Present Illness: Deborah [name] is a 81 y.o. female with a hx of St. Jude mechanical AVR in 1996, HTN, chronic AFib, hypothyroidism, HL, Breast CA.  She had issues with recurrent GI bleeding on Coumadin in the past requiring transfusions.  She was switched to once daily Lovenox injections without recurrent bleeding.  She has a Port-A-Cath in her L upper chest due to poor venous access.  She has no hx of ischemic heart disease.  Nuclear study in 2012 was normal. She had a rectus sheath hematoma in 9/16.  Anticoagulation was held.  She was then readmitted with TIA symptoms.  Echo demonstrated worsening gradients across her AV with peak 99 mmHg and mean 46 mmHg.  She was then resumed on Coumadin.  Last seen by Dr. Darlin Coco 02/01/15.    She called in recently with dyspnea and low HRs.  Her beta-blocker dose was cut in 1/2.  Her PCP noted that her BP was elevated and she was recently placed on Clonidine. The dose was increased as her BP remained uncontrolled.  Her BP is better but she continues to feel SOB.  She notes abdominal fullness and chest tightness.  She does note her chest tightness is chronic without change.  Her breathing continues to get worse.  She notes she sleeps on 2 pillows. No PND.  She denies cough.  She denies syncope.  Denies bleeding issues.  Labs 11/28: Na 132, K 4, BUN 22, SCr 0.8.  Labs 12/5: Hgb 12.5. Note she held her Metoprolol this AM.     Studies/Reports Reviewed Today:  Echo 12/26/14 Severe concentric LVH, EF 60-65%, no RWMA, mechanical AVR with trivial peri-valvular leak, elevated gradients c/w with severe AS (peak 99 mmHg, mean 46  mmHg), mod LAE, normal RVSF, mod RAE.  Carotid US 12/25/14 Bilateral 1-39% ICA  Myoview 3/12 IMPRESSION: 1. No evidence of pharmacologic induced myocardial ischemia. 2. Septal hypokinesis, likely due to previous cardiac valve replacement, otherwise normal wall motion, EF 68%.  Angiogram 1/12 Patent RA and celiac/mesenteric arteries   Past Medical History  Diagnosis Date  . Hypothyroidism   . Stomach problems     seeing Dr Watt Climes  . Hypertension   . GERD (gastroesophageal reflux disease)   . Anemia   . Arrhythmia     Atrial Fibrillation  . Breast cancer ([place])     "right; S/P lumpectomy, chemo, XRT"   . Complication of anesthesia     "once I'm awakened, I don't sleep til the following night"  . High cholesterol   . Heart murmur   . History of blood transfusion 1996; ~ 2013    "related to valve replacement; GI bleeding"   . Chronic lower GI bleeding   . Arthritis     "fingers, toes, hips; qwhere" (05/19/2013)  . Gout   . Pneumonia 07/2011  . Rectus sheath hematoma 12/19/14     held anticoagulation for 1 week, transfused  . TIA (transient ischemic attack) 12/24/14    symptoms resolved    Past Surgical History  Procedure Laterality Date  . Arteriogram  01/12    NO RAS   .  Cardioversion      X 2  . Cardiac catheterization    . Cataract extraction w/ intraocular lens  implant, bilateral Bilateral     bilateral cataract with lens implants  . Cholecystectomy    . Abdominal hysterectomy    . Appendectomy    . Colonoscopy with propofol N/A 11/24/2012    Procedure: COLONOSCOPY WITH PROPOFOL;  Surgeon: Jeryl Columbia, MD;  Location: [facility];  Service: Endoscopy;  Laterality: N/A;  . Breast biopsy Right   . Breast lumpectomy Right   . Aortic valve replacement  1996    St. Jude     Current Outpatient Prescriptions  Medication Sig Dispense Refill  . acetaminophen (TYLENOL) 500 MG tablet Take 1,000 mg by mouth every 6 (six) hours as needed for headache.    .  allopurinol (ZYLOPRIM) 300 MG tablet Take 300 mg by mouth daily.    Marland Kitchen amLODipine (NORVASC) 5 MG tablet Take 5 mg by mouth daily.     . calcium-vitamin D 500 MG tablet Take 1 tablet by mouth 2 (two) times daily.      . celecoxib (CELEBREX) 200 MG capsule Take 200 mg by mouth daily.  4  . cloNIDine (CATAPRES) 0.1 MG tablet 2 tablets (0.'2mg'$ ) by mouth two times a day    . dicyclomine (BENTYL) 10 MG capsule Take 10 mg by mouth daily as needed for spasms.     . digoxin (LANOXIN) 0.125 MG tablet Take 1 tablet (0.125 mg total) by mouth every Monday, Wednesday, and Friday.    . fexofenadine (ALLEGRA ALLERGY) 180 MG tablet Take 180 mg by mouth daily as needed for allergies.     . furosemide (LASIX) 40 MG tablet Take 40 mg by mouth daily as needed (for swelling).    . hydrochlorothiazide (MICROZIDE) 12.5 MG capsule Take 1 capsule (12.5 mg total) by mouth 2 (two) times daily. 180 capsule 3  . hydrOXYzine (ATARAX/VISTARIL) 25 MG tablet Take 25 mg by mouth 3 (three) times daily as needed (anxiety).    . influenza vac split quadrivalent PF (FLUARIX) 0.5 ML injection Inject 0.5 mLs into the muscle once. 0.5 mL 0  . influenza vac split quadrivalent PF (FLUARIX) 0.5 ML injection Inject 0.5 mLs into the muscle once. 0.5 mL 0  . levothyroxine (SYNTHROID, LEVOTHROID) 88 MCG tablet Take 88 mcg by mouth daily before breakfast.    . lidocaine-prilocaine (EMLA) cream Apply 1 application topically as needed (prior to infusion at porta cath site). 30 g 1  . losartan (COZAAR) 50 MG tablet Take 1 tablet (50 mg total) by mouth 2 (two) times daily. 60 tablet 5  . metoprolol tartrate (LOPRESSOR) 25 MG tablet 1/2 tablet (12.'5mg'$ ) by mouth two times a day    . multivitamin (THERAGRAN) per tablet Take 1 tablet by mouth daily.     Marland Kitchen NITROSTAT 0.4 MG SL tablet Place 0.4 mg under the tongue every 5 (five) minutes as needed for chest pain.   1  . Omega-3 Fatty Acids (FISH OIL) 1000 MG CAPS Take 1,000 mg by mouth 2 (two) times daily.       . potassium chloride SA (K-DUR,KLOR-CON) 20 MEQ tablet Take 20 mEq by mouth daily.    . simvastatin (ZOCOR) 20 MG tablet Take 10 mg by mouth daily.    Marland Kitchen warfarin (COUMADIN) 5 MG tablet Take 1 tablet (5 mg total) by mouth daily. 30 tablet 3  . zolpidem (AMBIEN CR) 12.5 MG CR tablet TAKE ONE TABLET AT BEDTIME AS  NEEDED 30 tablet 5   No current facility-administered medications for this visit.    Allergies:   Amiodarone; Alendronate sodium; Macrodantin; Meclizine; Norvasc; and Tussionex pennkinetic er    Social History:   Social History   Social History  . Marital Status: Widowed    Spouse Name: N/A  . Number of Children: N/A  . Years of Education: N/A   Social History Main Topics  . Smoking status: Former Smoker -- 1.00 packs/day for 20 years    Types: Cigarettes    Quit date: 04/01/1988  . Smokeless tobacco: Never Used  . Alcohol Use: 4.8 oz/week    4 Glasses of wine, 4 Shots of liquor per week     Comment: burbon or glass wine daily  . Drug Use: No  . Sexual Activity: No   Other Topics Concern  . None   Social History Narrative     Family History:   Family History  Problem Relation Age of Onset  . Dementia Mother   . Coronary artery disease Father       ROS:   Please see the history of present illness.   Review of Systems  HENT: Positive for hearing loss.   Cardiovascular: Positive for dyspnea on exertion.  Hematologic/Lymphatic: Bruises/bleeds easily.  Neurological: Positive for dizziness.  All other systems reviewed and are negative.     PHYSICAL EXAM: VS:  BP 172/72 mmHg  Pulse 67  Ht 5' (1.524 m)  Wt 128 lb 12.8 oz (58.423 kg)  BMI 25.15 kg/m2  SpO2 94%    Wt Readings from Last 3 Encounters:  03/07/15 128 lb 12.8 oz (58.423 kg)  02/01/15 128 lb 12.8 oz (58.423 kg)  12/30/14 128 lb 6.4 oz (58.242 kg)     GEN: Well nourished, well developed, in no acute distress HEENT: normal Neck: ? elevated JVD,  Carotid pulses intact,  no  masses Cardiac:  Normal S1/ mechanical S2, irreg irreg rhythm; 2/6 systolic murmur RUSB,  no rubs or gallops, no edema   Respiratory:  clear to auscultation bilaterally, no wheezing, rhonchi or rales. GI:  Distended  MS: no deformity or atrophy Skin: warm and dry  Neuro:  CNs II-XII intact, Strength and sensation are intact Psych: Normal affect   EKG:  EKG is ordered today.  It demonstrates:   AFib, HR 67, down-sloping ST segments 1, 2, aVF, V5-6, QTc 390 ms, Q waves V1-2, no change from prior tracings.    Recent Labs: 04/19/2014: TSH 1.915 12/25/2014: B Natriuretic Peptide 523.8* 02/27/2015: ALT 30; BUN 22.0; Creatinine 0.8; Potassium 4.0; Sodium 132* 03/06/2015: HGB 12.5; Platelets 197    Lipid Panel    Component Value Date/Time   CHOL 138 12/26/2014 0356   TRIG 94 12/26/2014 0356   HDL 55 12/26/2014 0356   CHOLHDL 2.5 12/26/2014 0356   VLDL 19 12/26/2014 0356   LDLCALC 64 12/26/2014 0356   LDLDIRECT 93.7 04/10/2011 1000      ASSESSMENT AND PLAN:  1. Dyspnea:  She appears to be somewhat volume overloaded.  I suspect this may contributing to her symptoms.  She also has had increased velocities across her AV by echo in 9/16.  She had no CAD by [facility] in 1996.  Last Myoview was in 2012 and low risk.  She has some chest tightness that is chronic and not changing. Her ECG is abnormal but likely related to Dig effect and there are no changes.  Her HR has been low and there have been no improvements  in her symptoms with improved HRs.  Her BP has been difficult to control of late.  She has a hx of GI bleeding but recent Hgb is normal.    -  Take Lasix 40 mg QD x 3 days along with K+ 20 mEq extra   -  BNP today.  -  BMET, BNP 1 week  -  Repeat Echo to recheck AV gradients  -  Arrange Lexiscan Myoview to rule out ischemia  -  Obtain 48 Hr Holter to check HR control and rule out significant bradycardia.  >> she declined.  2. Aortic Valve Disease:  She is s/p SJ mechanical AVR in 1996 and  recent echo has demonstrated increased gradients across her AV c/w severe AS.  Question if this could be contributing to her symptoms.  As note, I will repeat her echo to reassess her gradients.  Continue Coumadin.  Continue SBE prophylaxis.    3. Chronic Atrial Fibrillation:  Her HR is well controlled.  She has noted HRs in the 40-50s. Her medications have been adjusted and she held her beta-blocker this AM.  I suggested a 48 Hr Holter to assess her HR and rule out pauses.  She declined this for now.  I will reduce her Digoxin to 0.125 mg QD on Mon, Wed, Fri only.  If she records a HR in the 90s in the coming days, she should call.  I would resume her Metoprolol Tartrate at 12.5 mg Twice daily at that point.    4. HTN:  Difficult to control recently.  Question if this is being driving by volume excess.  Adjust diuretics as noted.  Consider increasing Amlodipine if BP remains elevated.  Continue current Rx.  Of note, angiogram in 2012 was neg for RA stenosis.    5. Hx of GI Bleeding:  Recent Hgb normal.    6. Hyperlipidemia:  Continue statin.       Medication Changes: Current medicines are reviewed at length with the patient today.  Concerns regarding medicines are as outlined above.  The following changes have been made:   Discontinued Medications   No medications on file   Modified Medications   Modified Medication Previous Medication   DIGOXIN (LANOXIN) 0.125 MG TABLET digoxin (LANOXIN) 0.125 MG tablet      Take 1 tablet (0.125 mg total) by mouth every Monday, Wednesday, and Friday.    Take 1 tablet (0.125 mg total) by mouth daily.   New Prescriptions   No medications on file   Labs/ tests ordered today include:   Orders Placed This Encounter  Procedures  . B Nat Peptide  . Basic Metabolic Panel (BMET)  . B Nat Peptide  . Myocardial Perfusion Imaging  . Holter monitor - 48 hour  . EKG 12-Lead  . Echocardiogram     Disposition:    FU with Dr. Darlin Coco next week.      Signed, Versie Starks, MHS 03/07/2015 5:12 PM    [facility] [place], [place], [place]  [address] Phone: [phone]; Fax: [phone]

## 2015-03-07 NOTE — Telephone Encounter (Signed)
Pt's daughter is calling in wanting to speak with the nurse about the pt. Please f/u with her   Thanks

## 2015-03-08 LAB — BRAIN NATRIURETIC PEPTIDE: Brain Natriuretic Peptide: 190.8 pg/mL — ABNORMAL HIGH (ref 0.0–100.0)

## 2015-03-09 ENCOUNTER — Telehealth: Payer: Self-pay | Admitting: Cardiology

## 2015-03-09 NOTE — Telephone Encounter (Signed)
New Message   Pt is wanting call form rn before 5pm she said she has been waiting since yesterday

## 2015-03-09 NOTE — Telephone Encounter (Signed)
Scheduled ov for next week

## 2015-03-10 ENCOUNTER — Telehealth: Payer: Self-pay | Admitting: *Deleted

## 2015-03-10 NOTE — Telephone Encounter (Signed)
Pt notified of lab results and is aware to only do lasix and K+ x 3 days then PRN and to monitor weight, call if wt is up 3 lb's x1 day or more sob, edema. Pt verbalized understanding to plan of care.

## 2015-03-13 ENCOUNTER — Ambulatory Visit: Payer: Self-pay

## 2015-03-13 ENCOUNTER — Other Ambulatory Visit (HOSPITAL_BASED_OUTPATIENT_CLINIC_OR_DEPARTMENT_OTHER): Payer: Medicare Other

## 2015-03-13 ENCOUNTER — Telehealth: Payer: Self-pay | Admitting: Oncology

## 2015-03-13 DIAGNOSIS — K2961 Other gastritis with bleeding: Secondary | ICD-10-CM

## 2015-03-13 DIAGNOSIS — G459 Transient cerebral ischemic attack, unspecified: Secondary | ICD-10-CM

## 2015-03-13 DIAGNOSIS — D500 Iron deficiency anemia secondary to blood loss (chronic): Secondary | ICD-10-CM

## 2015-03-13 DIAGNOSIS — I482 Chronic atrial fibrillation, unspecified: Secondary | ICD-10-CM

## 2015-03-13 DIAGNOSIS — I4891 Unspecified atrial fibrillation: Secondary | ICD-10-CM

## 2015-03-13 DIAGNOSIS — D689 Coagulation defect, unspecified: Secondary | ICD-10-CM

## 2015-03-13 DIAGNOSIS — Z7901 Long term (current) use of anticoagulants: Secondary | ICD-10-CM

## 2015-03-13 DIAGNOSIS — Z954 Presence of other heart-valve replacement: Secondary | ICD-10-CM

## 2015-03-13 LAB — CBC WITH DIFFERENTIAL/PLATELET
BASO%: 0.9 % (ref 0.0–2.0)
Basophils Absolute: 0 10*3/uL (ref 0.0–0.1)
EOS ABS: 0 10*3/uL (ref 0.0–0.5)
EOS%: 0.9 % (ref 0.0–7.0)
HEMATOCRIT: 39.7 % (ref 34.8–46.6)
HEMOGLOBIN: 13.2 g/dL (ref 11.6–15.9)
LYMPH#: 0.6 10*3/uL — AB (ref 0.9–3.3)
LYMPH%: 17.3 % (ref 14.0–49.7)
MCH: 32.3 pg (ref 25.1–34.0)
MCHC: 33.2 g/dL (ref 31.5–36.0)
MCV: 97.1 fL (ref 79.5–101.0)
MONO#: 0.4 10*3/uL (ref 0.1–0.9)
MONO%: 11 % (ref 0.0–14.0)
NEUT%: 69.9 % (ref 38.4–76.8)
NEUTROS ABS: 2.4 10*3/uL (ref 1.5–6.5)
Platelets: 159 10*3/uL (ref 145–400)
RBC: 4.09 10*6/uL (ref 3.70–5.45)
RDW: 14.3 % (ref 11.2–14.5)
WBC: 3.5 10*3/uL — AB (ref 3.9–10.3)
nRBC: 0 % (ref 0–0)

## 2015-03-13 LAB — PROTIME-INR
INR: 3.6 — AB (ref 2.00–3.50)
Protime: 43.2 Seconds — ABNORMAL HIGH (ref 10.6–13.4)

## 2015-03-13 NOTE — Telephone Encounter (Signed)
called patient with 12/16 lab

## 2015-03-13 NOTE — Progress Notes (Signed)
INR Today-  3.6 Present Dose- 2.5 mg Tuesday and Thursday  New Dose- skip coumadin dose today starting tomorrow 2.5 mg daily Repeat INR on Friday 03/17/15 Patient and Dr. Jana Hakim aware

## 2015-03-14 ENCOUNTER — Telehealth: Payer: Self-pay | Admitting: Cardiology

## 2015-03-14 NOTE — Telephone Encounter (Signed)
Will forward to Sun.

## 2015-03-14 NOTE — Telephone Encounter (Signed)
New message      For Rip Harbour on wed Pt had to cancel appt for 03-15-15 because she is sick.  She want Rip Harbour to call her and resc her appt when she returns.  I tried to help her, but she want Rip Harbour only

## 2015-03-15 ENCOUNTER — Ambulatory Visit: Payer: Medicare Other | Admitting: Cardiology

## 2015-03-15 NOTE — Telephone Encounter (Signed)
Follow up    She want Rip Harbour to call her and resc her appt when she returns. I tried to help her, but she want Rip Harbour only

## 2015-03-15 NOTE — Telephone Encounter (Signed)
Dr. Mare Ferrari not working back at office until then

## 2015-03-15 NOTE — Telephone Encounter (Signed)
Scheduled follow up appointment for patient first of January since patient unable to come Friday for appointment

## 2015-03-17 ENCOUNTER — Other Ambulatory Visit (HOSPITAL_BASED_OUTPATIENT_CLINIC_OR_DEPARTMENT_OTHER): Payer: Medicare Other

## 2015-03-17 DIAGNOSIS — K2961 Other gastritis with bleeding: Secondary | ICD-10-CM | POA: Diagnosis not present

## 2015-03-17 DIAGNOSIS — I4891 Unspecified atrial fibrillation: Secondary | ICD-10-CM

## 2015-03-17 DIAGNOSIS — D500 Iron deficiency anemia secondary to blood loss (chronic): Secondary | ICD-10-CM

## 2015-03-17 DIAGNOSIS — Z7901 Long term (current) use of anticoagulants: Secondary | ICD-10-CM

## 2015-03-17 DIAGNOSIS — D689 Coagulation defect, unspecified: Secondary | ICD-10-CM | POA: Diagnosis not present

## 2015-03-17 DIAGNOSIS — Z954 Presence of other heart-valve replacement: Secondary | ICD-10-CM

## 2015-03-17 LAB — CBC WITH DIFFERENTIAL/PLATELET
BASO%: 0.3 % (ref 0.0–2.0)
BASOS ABS: 0 10*3/uL (ref 0.0–0.1)
EOS ABS: 0 10*3/uL (ref 0.0–0.5)
EOS%: 1 % (ref 0.0–7.0)
HEMATOCRIT: 38.7 % (ref 34.8–46.6)
HGB: 13.1 g/dL (ref 11.6–15.9)
LYMPH%: 17.9 % (ref 14.0–49.7)
MCH: 32 pg (ref 25.1–34.0)
MCHC: 33.9 g/dL (ref 31.5–36.0)
MCV: 94.6 fL (ref 79.5–101.0)
MONO#: 0.3 10*3/uL (ref 0.1–0.9)
MONO%: 7.6 % (ref 0.0–14.0)
NEUT#: 2.9 10*3/uL (ref 1.5–6.5)
NEUT%: 73.2 % (ref 38.4–76.8)
NRBC: 0 % (ref 0–0)
PLATELETS: 187 10*3/uL (ref 145–400)
RBC: 4.09 10*6/uL (ref 3.70–5.45)
RDW: 13.6 % (ref 11.2–14.5)
WBC: 4 10*3/uL (ref 3.9–10.3)
lymph#: 0.7 10*3/uL — ABNORMAL LOW (ref 0.9–3.3)

## 2015-03-17 LAB — PROTIME-INR
INR: 1.6 — ABNORMAL LOW (ref 2.00–3.50)
Protime: 19.2 s — ABNORMAL HIGH (ref 10.6–13.4)

## 2015-03-20 ENCOUNTER — Telehealth: Payer: Self-pay

## 2015-03-20 ENCOUNTER — Other Ambulatory Visit: Payer: Self-pay

## 2015-03-20 DIAGNOSIS — E876 Hypokalemia: Secondary | ICD-10-CM

## 2015-03-20 DIAGNOSIS — I119 Hypertensive heart disease without heart failure: Secondary | ICD-10-CM

## 2015-03-20 NOTE — Telephone Encounter (Signed)
Patient requesting a potassium be added on to her lab appt on Wednesday per her PCP.  He is changing her medication and wants to know her potassium level.

## 2015-03-21 ENCOUNTER — Other Ambulatory Visit: Payer: Self-pay | Admitting: Oncology

## 2015-03-21 ENCOUNTER — Encounter: Payer: Medicare Other | Admitting: Oncology

## 2015-03-21 ENCOUNTER — Other Ambulatory Visit: Payer: Medicare Other

## 2015-03-21 ENCOUNTER — Encounter: Payer: Self-pay | Admitting: Oncology

## 2015-03-22 ENCOUNTER — Other Ambulatory Visit: Payer: Self-pay

## 2015-03-22 ENCOUNTER — Telehealth: Payer: Self-pay | Admitting: Oncology

## 2015-03-22 ENCOUNTER — Encounter (HOSPITAL_COMMUNITY): Payer: Medicare Other

## 2015-03-22 ENCOUNTER — Other Ambulatory Visit (HOSPITAL_COMMUNITY): Payer: Medicare Other

## 2015-03-22 ENCOUNTER — Ambulatory Visit: Payer: Self-pay

## 2015-03-22 ENCOUNTER — Ambulatory Visit (HOSPITAL_BASED_OUTPATIENT_CLINIC_OR_DEPARTMENT_OTHER): Payer: Medicare Other

## 2015-03-22 ENCOUNTER — Other Ambulatory Visit: Payer: Self-pay | Admitting: *Deleted

## 2015-03-22 DIAGNOSIS — G459 Transient cerebral ischemic attack, unspecified: Secondary | ICD-10-CM

## 2015-03-22 DIAGNOSIS — D500 Iron deficiency anemia secondary to blood loss (chronic): Secondary | ICD-10-CM | POA: Diagnosis not present

## 2015-03-22 DIAGNOSIS — E871 Hypo-osmolality and hyponatremia: Secondary | ICD-10-CM

## 2015-03-22 DIAGNOSIS — Z954 Presence of other heart-valve replacement: Secondary | ICD-10-CM

## 2015-03-22 DIAGNOSIS — N390 Urinary tract infection, site not specified: Secondary | ICD-10-CM

## 2015-03-22 DIAGNOSIS — Z7901 Long term (current) use of anticoagulants: Secondary | ICD-10-CM

## 2015-03-22 DIAGNOSIS — I482 Chronic atrial fibrillation, unspecified: Secondary | ICD-10-CM

## 2015-03-22 DIAGNOSIS — I119 Hypertensive heart disease without heart failure: Secondary | ICD-10-CM

## 2015-03-22 DIAGNOSIS — D689 Coagulation defect, unspecified: Secondary | ICD-10-CM

## 2015-03-22 DIAGNOSIS — I4891 Unspecified atrial fibrillation: Secondary | ICD-10-CM

## 2015-03-22 DIAGNOSIS — K2961 Other gastritis with bleeding: Secondary | ICD-10-CM

## 2015-03-22 DIAGNOSIS — E876 Hypokalemia: Secondary | ICD-10-CM

## 2015-03-22 LAB — CBC WITH DIFFERENTIAL/PLATELET
BASO%: 0.4 % (ref 0.0–2.0)
Basophils Absolute: 0 10*3/uL (ref 0.0–0.1)
EOS ABS: 0 10*3/uL (ref 0.0–0.5)
EOS%: 0.8 % (ref 0.0–7.0)
HCT: 38.1 % (ref 34.8–46.6)
HGB: 12.7 g/dL (ref 11.6–15.9)
LYMPH%: 13.8 % — AB (ref 14.0–49.7)
MCH: 31.7 pg (ref 25.1–34.0)
MCHC: 33.3 g/dL (ref 31.5–36.0)
MCV: 95 fL (ref 79.5–101.0)
MONO#: 0.4 10*3/uL (ref 0.1–0.9)
MONO%: 8.3 % (ref 0.0–14.0)
NEUT%: 76.7 % (ref 38.4–76.8)
NEUTROS ABS: 3.8 10*3/uL (ref 1.5–6.5)
Platelets: 223 10*3/uL (ref 145–400)
RBC: 4.01 10*6/uL (ref 3.70–5.45)
RDW: 13.6 % (ref 11.2–14.5)
WBC: 4.9 10*3/uL (ref 3.9–10.3)
lymph#: 0.7 10*3/uL — ABNORMAL LOW (ref 0.9–3.3)

## 2015-03-22 LAB — URINALYSIS, MICROSCOPIC - CHCC
BILIRUBIN (URINE): NEGATIVE
Blood: NEGATIVE
Glucose: NEGATIVE mg/dL
Ketones: NEGATIVE mg/dL
LEUKOCYTE ESTERASE: NEGATIVE
NITRITE: NEGATIVE
PH: 6 (ref 4.6–8.0)
Protein: NEGATIVE mg/dL
RBC / HPF: NEGATIVE (ref 0–2)
Specific Gravity, Urine: 1.01 (ref 1.003–1.035)
Urobilinogen, UR: 0.2 mg/dL (ref 0.2–1)

## 2015-03-22 LAB — PROTIME-INR
INR: 1.5 — ABNORMAL LOW (ref 2.00–3.50)
PROTIME: 18 s — AB (ref 10.6–13.4)

## 2015-03-22 LAB — POTASSIUM (CC13): Potassium: 3.8 mEq/L (ref 3.5–5.1)

## 2015-03-22 LAB — FERRITIN: FERRITIN: 297 ng/mL — AB (ref 9–269)

## 2015-03-22 MED ORDER — CIPROFLOXACIN HCL 500 MG PO TABS: 500.0000 mg | ORAL_TABLET | Freq: Every day | ORAL | Status: DC

## 2015-03-22 NOTE — Progress Notes (Signed)
INR Today-1.5 Present Dose per Patient- 5 mg Friday , Saturday and Tuesday.   2.5 mg Sunday and Monday  Adjusted dose per Dr. Jana Hakim - 5 mg daily Repaet INR-03/28/15 Patient stated understanding  Patient's potassium came back today at 3.8  Faxed results to patient's PCP Dr. Lajean Manes [phone]

## 2015-03-22 NOTE — Progress Notes (Signed)
Patient is c/o UTI symptoms.  Per Dr. Jana Hakim a urinalysis and culture were ordered.  Patient is to be started on cipro 500 mg daily for 7 days.

## 2015-03-22 NOTE — Telephone Encounter (Signed)
Spoke with patient and she is aware of her apppintment

## 2015-03-23 LAB — URINE CULTURE

## 2015-03-28 ENCOUNTER — Telehealth: Payer: Self-pay | Admitting: *Deleted

## 2015-03-28 ENCOUNTER — Ambulatory Visit: Payer: Self-pay | Admitting: *Deleted

## 2015-03-28 ENCOUNTER — Other Ambulatory Visit (HOSPITAL_BASED_OUTPATIENT_CLINIC_OR_DEPARTMENT_OTHER): Payer: Medicare Other

## 2015-03-28 DIAGNOSIS — D500 Iron deficiency anemia secondary to blood loss (chronic): Secondary | ICD-10-CM

## 2015-03-28 DIAGNOSIS — I4891 Unspecified atrial fibrillation: Secondary | ICD-10-CM

## 2015-03-28 DIAGNOSIS — D689 Coagulation defect, unspecified: Secondary | ICD-10-CM

## 2015-03-28 DIAGNOSIS — Z954 Presence of other heart-valve replacement: Secondary | ICD-10-CM

## 2015-03-28 DIAGNOSIS — K2961 Other gastritis with bleeding: Secondary | ICD-10-CM

## 2015-03-28 DIAGNOSIS — Z7901 Long term (current) use of anticoagulants: Secondary | ICD-10-CM

## 2015-03-28 LAB — CBC WITH DIFFERENTIAL/PLATELET
BASO%: 0.4 % (ref 0.0–2.0)
Basophils Absolute: 0 10*3/uL (ref 0.0–0.1)
EOS%: 0.9 % (ref 0.0–7.0)
Eosinophils Absolute: 0 10*3/uL (ref 0.0–0.5)
HCT: 37.3 % (ref 34.8–46.6)
HGB: 12.4 g/dL (ref 11.6–15.9)
LYMPH%: 12 % — ABNORMAL LOW (ref 14.0–49.7)
MCH: 31.6 pg (ref 25.1–34.0)
MCHC: 33.2 g/dL (ref 31.5–36.0)
MCV: 95.2 fL (ref 79.5–101.0)
MONO#: 0.3 10*3/uL (ref 0.1–0.9)
MONO%: 6.1 % (ref 0.0–14.0)
NEUT%: 80.6 % — ABNORMAL HIGH (ref 38.4–76.8)
NEUTROS ABS: 3.7 10*3/uL (ref 1.5–6.5)
NRBC: 0 % (ref 0–0)
PLATELETS: 207 10*3/uL (ref 145–400)
RBC: 3.92 10*6/uL (ref 3.70–5.45)
RDW: 13.8 % (ref 11.2–14.5)
WBC: 4.6 10*3/uL (ref 3.9–10.3)
lymph#: 0.6 10*3/uL — ABNORMAL LOW (ref 0.9–3.3)

## 2015-03-28 LAB — PROTIME-INR
INR: 2.4 (ref 2.00–3.50)
PROTIME: 28.8 s — AB (ref 10.6–13.4)

## 2015-03-28 NOTE — Telephone Encounter (Signed)
INR 2.4  Coumadin dose is '5mg'$  daily.  No change in dose per review by HB/NP.  Recheck in 1 week.  Above given in writing to pt with verbalized understanding.

## 2015-03-30 ENCOUNTER — Telehealth (HOSPITAL_COMMUNITY): Payer: Self-pay | Admitting: *Deleted

## 2015-03-30 NOTE — Telephone Encounter (Signed)
Patient given detailed instructions per Myocardial Perfusion Study Information Sheet for the test on 04/05/15 at 1230. Patient notified to arrive 15 minutes early and that it is imperative to arrive on time for appointment to keep from having the test rescheduled.  If you need to cancel or reschedule your appointment, please call the office within 24 hours of your appointment. Failure to do so may result in a cancellation of your appointment, and a $50 no show fee. Patient verbalized understanding.Mahaila Tischer, Ranae Palms

## 2015-04-04 ENCOUNTER — Other Ambulatory Visit: Payer: Self-pay | Admitting: *Deleted

## 2015-04-04 ENCOUNTER — Other Ambulatory Visit (HOSPITAL_BASED_OUTPATIENT_CLINIC_OR_DEPARTMENT_OTHER): Payer: Medicare Other

## 2015-04-04 ENCOUNTER — Telehealth: Payer: Self-pay | Admitting: *Deleted

## 2015-04-04 DIAGNOSIS — I4891 Unspecified atrial fibrillation: Secondary | ICD-10-CM

## 2015-04-04 DIAGNOSIS — Z7901 Long term (current) use of anticoagulants: Secondary | ICD-10-CM

## 2015-04-04 DIAGNOSIS — Z954 Presence of other heart-valve replacement: Secondary | ICD-10-CM

## 2015-04-04 DIAGNOSIS — D689 Coagulation defect, unspecified: Secondary | ICD-10-CM | POA: Diagnosis not present

## 2015-04-04 DIAGNOSIS — K921 Melena: Secondary | ICD-10-CM

## 2015-04-04 DIAGNOSIS — K2961 Other gastritis with bleeding: Secondary | ICD-10-CM

## 2015-04-04 DIAGNOSIS — D500 Iron deficiency anemia secondary to blood loss (chronic): Secondary | ICD-10-CM

## 2015-04-04 LAB — CBC WITH DIFFERENTIAL/PLATELET
BASO%: 0.4 % (ref 0.0–2.0)
Basophils Absolute: 0 10*3/uL (ref 0.0–0.1)
EOS%: 0.8 % (ref 0.0–7.0)
Eosinophils Absolute: 0 10*3/uL (ref 0.0–0.5)
HEMATOCRIT: 33 % — AB (ref 34.8–46.6)
HEMOGLOBIN: 10.9 g/dL — AB (ref 11.6–15.9)
LYMPH#: 0.6 10*3/uL — AB (ref 0.9–3.3)
LYMPH%: 12.3 % — ABNORMAL LOW (ref 14.0–49.7)
MCH: 32.1 pg (ref 25.1–34.0)
MCHC: 33 g/dL (ref 31.5–36.0)
MCV: 97.1 fL (ref 79.5–101.0)
MONO#: 0.3 10*3/uL (ref 0.1–0.9)
MONO%: 6.2 % (ref 0.0–14.0)
NEUT%: 80.3 % — ABNORMAL HIGH (ref 38.4–76.8)
NEUTROS ABS: 4.1 10*3/uL (ref 1.5–6.5)
NRBC: 0 % (ref 0–0)
Platelets: 175 10*3/uL (ref 145–400)
RBC: 3.4 10*6/uL — ABNORMAL LOW (ref 3.70–5.45)
RDW: 14.4 % (ref 11.2–14.5)
WBC: 5.1 10*3/uL (ref 3.9–10.3)

## 2015-04-04 LAB — PROTIME-INR
INR: 3.2 (ref 2.00–3.50)
Protime: 38.4 Seconds — ABNORMAL HIGH (ref 10.6–13.4)

## 2015-04-04 NOTE — Telephone Encounter (Signed)
INR 3.2 Current coumadin dose is 5 mg daily Next lab is 04/19/2015  Per review and noted drop in heme- pt states she starting with dark stools over the weekend. " just like a oozing "  Per MD review recommendation is to hold coumadin today  Restart tomorrow at 2.5 mg daily and recheck lab on Friday 1/6.  Above reviewed with pt and appointment entered by this RN.

## 2015-04-05 ENCOUNTER — Telehealth: Payer: Self-pay | Admitting: *Deleted

## 2015-04-05 ENCOUNTER — Ambulatory Visit (HOSPITAL_BASED_OUTPATIENT_CLINIC_OR_DEPARTMENT_OTHER): Payer: Medicare Other

## 2015-04-05 ENCOUNTER — Ambulatory Visit (HOSPITAL_COMMUNITY): Payer: Medicare Other | Attending: Cardiology

## 2015-04-05 ENCOUNTER — Other Ambulatory Visit: Payer: Self-pay

## 2015-04-05 ENCOUNTER — Encounter: Payer: Self-pay | Admitting: Physician Assistant

## 2015-04-05 DIAGNOSIS — Z954 Presence of other heart-valve replacement: Secondary | ICD-10-CM | POA: Diagnosis not present

## 2015-04-05 DIAGNOSIS — Z952 Presence of prosthetic heart valve: Secondary | ICD-10-CM

## 2015-04-05 DIAGNOSIS — R0602 Shortness of breath: Secondary | ICD-10-CM | POA: Insufficient documentation

## 2015-04-05 DIAGNOSIS — I351 Nonrheumatic aortic (valve) insufficiency: Secondary | ICD-10-CM | POA: Diagnosis not present

## 2015-04-05 DIAGNOSIS — R0609 Other forms of dyspnea: Secondary | ICD-10-CM | POA: Insufficient documentation

## 2015-04-05 DIAGNOSIS — R079 Chest pain, unspecified: Secondary | ICD-10-CM | POA: Diagnosis not present

## 2015-04-05 DIAGNOSIS — I10 Essential (primary) hypertension: Secondary | ICD-10-CM | POA: Diagnosis not present

## 2015-04-05 DIAGNOSIS — I517 Cardiomegaly: Secondary | ICD-10-CM | POA: Diagnosis not present

## 2015-04-05 DIAGNOSIS — I359 Nonrheumatic aortic valve disorder, unspecified: Secondary | ICD-10-CM | POA: Diagnosis present

## 2015-04-05 DIAGNOSIS — I340 Nonrheumatic mitral (valve) insufficiency: Secondary | ICD-10-CM | POA: Diagnosis not present

## 2015-04-05 LAB — MYOCARDIAL PERFUSION IMAGING
CHL CUP NUCLEAR SDS: 2
CHL CUP NUCLEAR SRS: 2
LHR: 0.26
LV sys vol: 45 mL
LVDIAVOL: 110 mL
Peak HR: 85 {beats}/min
Rest HR: 56 {beats}/min
SSS: 4
TID: 1.02

## 2015-04-05 MED ORDER — TECHNETIUM TC 99M SESTAMIBI GENERIC - CARDIOLITE
31.0000 | Freq: Once | INTRAVENOUS | Status: AC | PRN
  Administered 2015-04-05: 31 via INTRAVENOUS

## 2015-04-05 MED ORDER — TECHNETIUM TC 99M SESTAMIBI GENERIC - CARDIOLITE
10.2000 | Freq: Once | INTRAVENOUS | Status: AC | PRN
  Administered 2015-04-05: 10 via INTRAVENOUS

## 2015-04-05 MED ORDER — REGADENOSON 0.4 MG/5ML IV SOLN
0.4000 mg | Freq: Once | INTRAVENOUS | Status: AC
  Administered 2015-04-05: 0.4 mg via INTRAVENOUS

## 2015-04-05 NOTE — Telephone Encounter (Signed)
Pt notified of myoview results by phone with verbal understanding. Pt has appt with Dr. Mare Ferrari 1/5. Pt states she does not want to wear monitor and she will d/w Dr. Mare Ferrari.

## 2015-04-06 ENCOUNTER — Encounter: Payer: Self-pay | Admitting: Cardiology

## 2015-04-06 ENCOUNTER — Other Ambulatory Visit: Payer: Medicare Other

## 2015-04-06 ENCOUNTER — Other Ambulatory Visit: Payer: Self-pay | Admitting: Oncology

## 2015-04-06 ENCOUNTER — Ambulatory Visit (INDEPENDENT_AMBULATORY_CARE_PROVIDER_SITE_OTHER): Payer: Medicare Other | Admitting: Cardiology

## 2015-04-06 VITALS — BP 140/68 | HR 90 | Ht 60.0 in | Wt 117.8 lb

## 2015-04-06 DIAGNOSIS — I482 Chronic atrial fibrillation, unspecified: Secondary | ICD-10-CM

## 2015-04-06 DIAGNOSIS — D500 Iron deficiency anemia secondary to blood loss (chronic): Secondary | ICD-10-CM

## 2015-04-06 DIAGNOSIS — I10 Essential (primary) hypertension: Secondary | ICD-10-CM

## 2015-04-06 DIAGNOSIS — Z952 Presence of prosthetic heart valve: Secondary | ICD-10-CM

## 2015-04-06 DIAGNOSIS — Z954 Presence of other heart-valve replacement: Secondary | ICD-10-CM

## 2015-04-06 NOTE — Progress Notes (Signed)
Cardiology Office Note   Date:  04/06/2015   ID:  Deborah [name], DOB 11/19/1926, MRN [id number]  PCP:  Mathews Argyle, MD  Cardiologist: Darlin Coco MD  No chief complaint on file.     History of Present Illness: Deborah [name] is a 82 y.o. female who presents for scheduled follow-up visit.  She has a history of previous mechanical aortic valve replacement in 1996 and she has a history of chronic atrial fibrillation and has been on chronic anticoagulation. She had been on long-term Coumadin.  She was subsequently switched to subcutaneous Lovenox, but after developing a severe retroperitoneal hematoma she was switched back to warfarin again.  Her anticoagulation is managed by her oncologist Dr. Griffith Citron.  We are aiming for a lower goal INR of 2.0-2.5 because of her history of recurrent GI bleeding requiring transfusion. She has a Port-A-Cath in place in her left upper chest because of poor venous access. She does not have any ischemic heart disease and she had a normal nuclear stress test in March 2012.  She had another Myoview on 04/05/15 which was also negative for ischemia and showed normal LV function. She has not been experiencing any new cardiac symptoms. She does have a past history of high blood pressure. She has been seen by Dr. Felipa Eth for her high blood pressure.  She is now on low-dose clonidine and also on spironolactone and her blood pressure is remaining stable In September 2016 she was treated for a large retroperitoneal hematoma.  She was kept off of anticoagulation briefly.  She had a slight TIA and was placed back on anticoagulation.  She is now back on warfarin. Unfortunately she has noted recent dark stools and feels that she is having recurrent GI bleeding again.  Her anemia is followed closely by her oncologist.  She requires frequent infusions of iron. She has not been having any recent chest pain or angina.  Recent Myoview was negative for ischemia  yesterday.  Her most recent echocardiogram done on 04/05/15 showed mild to moderate aortic stenosis across her mechanical aortic valve prosthesis.  She is not having any symptoms of congestive heart failure and has not had to take any supplemental furosemide recently. Past Medical History  Diagnosis Date  . Hypothyroidism   . Stomach problems     seeing Dr Watt Climes  . Hypertension   . GERD (gastroesophageal reflux disease)   . Anemia   . Arrhythmia     Atrial Fibrillation  . Breast cancer ([place])     "right; S/P lumpectomy, chemo, XRT"   . Complication of anesthesia     "once I'm awakened, I don't sleep til the following night"  . High cholesterol   . Heart murmur   . History of blood transfusion 1996; ~ 2013    "related to valve replacement; GI bleeding"   . Chronic lower GI bleeding   . Arthritis     "fingers, toes, hips; qwhere" (05/19/2013)  . Gout   . Pneumonia 07/2011  . Rectus sheath hematoma 12/19/14     held anticoagulation for 1 week, transfused  . TIA (transient ischemic attack) 12/24/14    symptoms resolved  . History of nuclear stress test     Myoview 1/17: EF 59%, normal perfusion, low risk    Past Surgical History  Procedure Laterality Date  . Arteriogram  01/12    NO RAS   . Cardioversion      X 2  . Cardiac catheterization    .  Cataract extraction w/ intraocular lens  implant, bilateral Bilateral     bilateral cataract with lens implants  . Cholecystectomy    . Abdominal hysterectomy    . Appendectomy    . Colonoscopy with propofol N/A 11/24/2012    Procedure: COLONOSCOPY WITH PROPOFOL;  Surgeon: Jeryl Columbia, MD;  Location: [facility];  Service: Endoscopy;  Laterality: N/A;  . Breast biopsy Right   . Breast lumpectomy Right   . Aortic valve replacement  1996    [employer]     Current Outpatient Prescriptions  Medication Sig Dispense Refill  . acetaminophen (TYLENOL) 500 MG tablet Take 1,000 mg by mouth every 6 (six) hours as needed for headache.    .  allopurinol (ZYLOPRIM) 300 MG tablet Take 300 mg by mouth daily.    Marland Kitchen amLODipine (NORVASC) 5 MG tablet Take 5 mg by mouth daily.     . calcium-vitamin D 500 MG tablet Take 1 tablet by mouth 2 (two) times daily.      . celecoxib (CELEBREX) 200 MG capsule Take 200 mg by mouth daily.  4  . cloNIDine (CATAPRES) 0.1 MG tablet Take 0.1 mg by mouth 2 (two) times daily. 2 tablets (0.'2mg'$ ) by mouth two times a day    . dicyclomine (BENTYL) 10 MG capsule Take 10 mg by mouth daily as needed for spasms.     . digoxin (LANOXIN) 0.125 MG tablet Take 0.125 mg by mouth daily.    . fexofenadine (ALLEGRA ALLERGY) 180 MG tablet Take 180 mg by mouth daily as needed for allergies.     . furosemide (LASIX) 40 MG tablet Take 40 mg by mouth daily as needed (for swelling).    . hydrochlorothiazide (MICROZIDE) 12.5 MG capsule Take 1 capsule (12.5 mg total) by mouth 2 (two) times daily. 180 capsule 3  . hydrOXYzine (ATARAX/VISTARIL) 25 MG tablet Take 25 mg by mouth 3 (three) times daily as needed (anxiety).    Marland Kitchen levothyroxine (SYNTHROID, LEVOTHROID) 88 MCG tablet Take 88 mcg by mouth daily before breakfast.    . lidocaine-prilocaine (EMLA) cream Apply 1 application topically as needed (prior to infusion at porta cath site). 30 g 1  . losartan (COZAAR) 50 MG tablet Take 1 tablet (50 mg total) by mouth 2 (two) times daily. 60 tablet 5  . multivitamin (THERAGRAN) per tablet Take 1 tablet by mouth daily.     Marland Kitchen NITROSTAT 0.4 MG SL tablet Place 0.4 mg under the tongue every 5 (five) minutes as needed for chest pain (x 3 doses).   1  . Omega-3 Fatty Acids (FISH OIL) 1000 MG CAPS Take 1,000 mg by mouth 2 (two) times daily.     . potassium chloride SA (K-DUR,KLOR-CON) 20 MEQ tablet Take 20 mEq by mouth daily.    . simvastatin (ZOCOR) 20 MG tablet Take 10 mg by mouth daily.    Marland Kitchen spironolactone (ALDACTONE) 25 MG tablet Take 25 mg by mouth 2 (two) times daily.  5  . warfarin (COUMADIN) 5 MG tablet Take 1 tablet (5 mg total) by mouth  daily. 30 tablet 3  . zolpidem (AMBIEN CR) 12.5 MG CR tablet Take 12.5 mg by mouth at bedtime as needed for sleep.     No current facility-administered medications for this visit.    Allergies:   Amiodarone; Alendronate sodium; Macrodantin; Meclizine; Norvasc; and Tussionex pennkinetic er    Social History:  The patient  reports that she quit smoking about 27 years ago. Her smoking use included Cigarettes.  She has a 20 pack-year smoking history. She has never used smokeless tobacco. She reports that she drinks about 4.8 oz of alcohol per week. She reports that she does not use illicit drugs.   Family History:  The patient's family history includes Coronary artery disease in her father; Dementia in her mother.    ROS:  Please see the history of present illness.   Otherwise, review of systems are positive for none.   All other systems are reviewed and negative.    PHYSICAL EXAM: VS:  BP 140/68 mmHg  Pulse 90  Ht 5' (1.524 m)  Wt 117 lb 12.8 oz (53.434 kg)  BMI 23.01 kg/m2 , BMI Body mass index is 23.01 kg/(m^2). GEN: Well nourished, well developed, in no acute distressShe appears pale consistent with her known anemia HEENT: normal Neck: no JVD, carotid bruits, or masses Cardiac: Irregularly irregular rhythm.  Grade 3/6 harsh systolic ejection murmur at the base.  Good aortic closure sounds.  No aortic insufficiency heard.  No gallop or rub. Respiratory:  clear to auscultation bilaterally, normal work of breathing GI: soft, nontender, nondistended, + BS MS: no deformity or atrophy Skin: warm and dry, no rash Neuro:  Strength and sensation are intact Psych: euthymic mood, full affect   EKG:  EKG is not ordered today.    Recent Labs: 04/19/2014: TSH 1.915 12/25/2014: B Natriuretic Peptide 523.8* 02/27/2015: ALT 30; BUN 22.0; Creatinine 0.8; Sodium 132* 03/22/2015: Potassium 3.8 04/04/2015: HGB 10.9*; Platelets 175    Lipid Panel    Component Value Date/Time   CHOL 138  12/26/2014 0356   TRIG 94 12/26/2014 0356   HDL 55 12/26/2014 0356   CHOLHDL 2.5 12/26/2014 0356   VLDL 19 12/26/2014 0356   LDLCALC 64 12/26/2014 0356   LDLDIRECT 93.7 04/10/2011 1000      Wt Readings from Last 3 Encounters:  04/06/15 117 lb 12.8 oz (53.434 kg)  04/05/15 128 lb (58.06 kg)  03/07/15 128 lb 12.8 oz (58.423 kg)         ASSESSMENT AND PLAN:  1. Mechanical aortic valve replacement in 1996.  Mild to moderate aortic stenosis by echocardiogram 04/05/15 showing peak systolic gradient of 42 and mean gradient of 21  2. essential hypertension with satisfactory control on current therapy 3. chronic atrial fibrillation. She is now back on warfarin after developing a left flank hematoma while on Lovenox shots 4. History of occult GI blood loss with iron deficiency anemia, followed by Dr. Watt Climes and Dr. Jana Hakim.  She feels that she is starting to have some GI bleeding again and has noted recent dark stool  5. Hypothyroidism 6. Hypercholesterolemia   Current medicines are reviewed at length with the patient today. The patient does not have concerns regarding medicines.  The following changes have been made: no change  Labs/ tests ordered today include:  No orders of the defined types were placed in this encounter.   Disposition: Continue current medication.  Following my retirement she will be followed by Dr. Oval Linsey.  Return in March for office visit and EKG  Current medicines are reviewed at length with the patient today.  The patient does not have concerns regarding medicines.  The following changes have been made:  no change  Labs/ tests ordered today include:  No orders of the defined types were placed in this encounter.     Berna Spare MD 04/06/2015 1:35 PM    [place], [place]  [address] Phone: [phone]; Fax: [phone]

## 2015-04-06 NOTE — Patient Instructions (Signed)
Medication Instructions:  Your physician recommends that you continue on your current medications as directed. Please refer to the Current Medication list given to you today.  Labwork: none  Testing/Procedures: none  Follow-Up: Your physician recommends that you schedule a follow-up appointment in: 3 month ov/ekg with Dr Oval Linsey   If you need a refill on your cardiac medications before your next appointment, please call your pharmacy.

## 2015-04-07 ENCOUNTER — Ambulatory Visit (HOSPITAL_BASED_OUTPATIENT_CLINIC_OR_DEPARTMENT_OTHER): Payer: Medicare Other

## 2015-04-07 ENCOUNTER — Encounter: Payer: Self-pay | Admitting: Physician Assistant

## 2015-04-07 ENCOUNTER — Other Ambulatory Visit (HOSPITAL_BASED_OUTPATIENT_CLINIC_OR_DEPARTMENT_OTHER): Payer: Medicare Other

## 2015-04-07 VITALS — BP 125/56 | HR 62 | Temp 97.9°F | Resp 18

## 2015-04-07 DIAGNOSIS — D500 Iron deficiency anemia secondary to blood loss (chronic): Secondary | ICD-10-CM | POA: Diagnosis present

## 2015-04-07 DIAGNOSIS — D689 Coagulation defect, unspecified: Secondary | ICD-10-CM | POA: Diagnosis present

## 2015-04-07 DIAGNOSIS — Z954 Presence of other heart-valve replacement: Secondary | ICD-10-CM

## 2015-04-07 DIAGNOSIS — Z7901 Long term (current) use of anticoagulants: Secondary | ICD-10-CM

## 2015-04-07 DIAGNOSIS — K921 Melena: Secondary | ICD-10-CM

## 2015-04-07 DIAGNOSIS — Z95828 Presence of other vascular implants and grafts: Secondary | ICD-10-CM

## 2015-04-07 LAB — COMPREHENSIVE METABOLIC PANEL
ALBUMIN: 3.6 g/dL (ref 3.5–5.0)
ALK PHOS: 63 U/L (ref 40–150)
ALT: 19 U/L (ref 0–55)
AST: 19 U/L (ref 5–34)
Anion Gap: 6 mEq/L (ref 3–11)
BILIRUBIN TOTAL: 0.39 mg/dL (ref 0.20–1.20)
BUN: 36.9 mg/dL — AB (ref 7.0–26.0)
CO2: 26 mEq/L (ref 22–29)
CREATININE: 1 mg/dL (ref 0.6–1.1)
Calcium: 10 mg/dL (ref 8.4–10.4)
Chloride: 101 mEq/L (ref 98–109)
EGFR: 50 mL/min/{1.73_m2} — ABNORMAL LOW (ref >90)
Glucose: 97 mg/dl (ref 70–140)
Potassium: 4.7 mEq/L (ref 3.5–5.1)
SODIUM: 133 meq/L — AB (ref 136–145)
TOTAL PROTEIN: 6.1 g/dL — AB (ref 6.4–8.3)

## 2015-04-07 LAB — FERRITIN: FERRITIN: 169 ng/mL (ref 9–269)

## 2015-04-07 LAB — CBC WITH DIFFERENTIAL/PLATELET
BASO%: 0.7 % (ref 0.0–2.0)
BASOS ABS: 0 10*3/uL (ref 0.0–0.1)
EOS ABS: 0.1 10*3/uL (ref 0.0–0.5)
EOS%: 1.3 % (ref 0.0–7.0)
HEMATOCRIT: 31.4 % — AB (ref 34.8–46.6)
HEMOGLOBIN: 10.2 g/dL — AB (ref 11.6–15.9)
LYMPH#: 0.8 10*3/uL — AB (ref 0.9–3.3)
LYMPH%: 15 % (ref 14.0–49.7)
MCH: 31.7 pg (ref 25.1–34.0)
MCHC: 32.4 g/dL (ref 31.5–36.0)
MCV: 98 fL (ref 79.5–101.0)
MONO#: 0.4 10*3/uL (ref 0.1–0.9)
MONO%: 8.5 % (ref 0.0–14.0)
NEUT%: 74.5 % (ref 38.4–76.8)
NEUTROS ABS: 3.8 10*3/uL (ref 1.5–6.5)
PLATELETS: 171 10*3/uL (ref 145–400)
RBC: 3.2 10*6/uL — ABNORMAL LOW (ref 3.70–5.45)
RDW: 15.5 % — AB (ref 11.2–14.5)
WBC: 5 10*3/uL (ref 3.9–10.3)

## 2015-04-07 LAB — PROTIME-INR
INR: 1.6 — ABNORMAL LOW (ref 2.00–3.50)
PROTIME: 19.2 s — AB (ref 10.6–13.4)

## 2015-04-07 MED ORDER — HEPARIN SOD (PORK) LOCK FLUSH 100 UNIT/ML IV SOLN
500.0000 [IU] | Freq: Once | INTRAVENOUS | Status: AC
  Administered 2015-04-07: 500 [IU] via INTRAVENOUS
  Filled 2015-04-07: qty 5

## 2015-04-07 MED ORDER — SODIUM CHLORIDE 0.9 % IJ SOLN
10.0000 mL | INTRAMUSCULAR | Status: DC | PRN
  Administered 2015-04-07: 10 mL via INTRAVENOUS
  Filled 2015-04-07: qty 10

## 2015-04-07 NOTE — Patient Instructions (Signed)

## 2015-04-13 ENCOUNTER — Other Ambulatory Visit: Payer: Self-pay | Admitting: *Deleted

## 2015-04-13 ENCOUNTER — Telehealth: Payer: Self-pay | Admitting: *Deleted

## 2015-04-13 ENCOUNTER — Ambulatory Visit: Payer: Medicare Other

## 2015-04-13 ENCOUNTER — Ambulatory Visit (HOSPITAL_COMMUNITY)
Admission: RE | Admit: 2015-04-13 | Discharge: 2015-04-13 | Disposition: A | Discharge status: Home / Self Care | Payer: Medicare Other | Source: Ambulatory Visit | Attending: Oncology | Admitting: Oncology

## 2015-04-13 ENCOUNTER — Other Ambulatory Visit (HOSPITAL_BASED_OUTPATIENT_CLINIC_OR_DEPARTMENT_OTHER): Payer: Medicare Other

## 2015-04-13 DIAGNOSIS — D649 Anemia, unspecified: Secondary | ICD-10-CM | POA: Insufficient documentation

## 2015-04-13 DIAGNOSIS — Z954 Presence of other heart-valve replacement: Secondary | ICD-10-CM

## 2015-04-13 DIAGNOSIS — Z7901 Long term (current) use of anticoagulants: Secondary | ICD-10-CM

## 2015-04-13 DIAGNOSIS — D689 Coagulation defect, unspecified: Secondary | ICD-10-CM

## 2015-04-13 DIAGNOSIS — K2961 Other gastritis with bleeding: Secondary | ICD-10-CM

## 2015-04-13 DIAGNOSIS — I4891 Unspecified atrial fibrillation: Secondary | ICD-10-CM

## 2015-04-13 DIAGNOSIS — D500 Iron deficiency anemia secondary to blood loss (chronic): Secondary | ICD-10-CM

## 2015-04-13 LAB — PROTIME-INR
INR: 1.7 — ABNORMAL LOW (ref 2.00–3.50)
Protime: 20.4 Seconds — ABNORMAL HIGH (ref 10.6–13.4)

## 2015-04-13 LAB — CBC WITH DIFFERENTIAL/PLATELET
BASO%: 0.6 % (ref 0.0–2.0)
Basophils Absolute: 0 10*3/uL (ref 0.0–0.1)
EOS ABS: 0.1 10*3/uL (ref 0.0–0.5)
EOS%: 1.2 % (ref 0.0–7.0)
HCT: 29.9 % — ABNORMAL LOW (ref 34.8–46.6)
HGB: 9.6 g/dL — ABNORMAL LOW (ref 11.6–15.9)
LYMPH%: 14 % (ref 14.0–49.7)
MCH: 32.2 pg (ref 25.1–34.0)
MCHC: 32.1 g/dL (ref 31.5–36.0)
MCV: 100.3 fL (ref 79.5–101.0)
MONO#: 0.3 10*3/uL (ref 0.1–0.9)
MONO%: 6.6 % (ref 0.0–14.0)
NEUT%: 77.6 % — AB (ref 38.4–76.8)
NEUTROS ABS: 3.8 10*3/uL (ref 1.5–6.5)
PLATELETS: 201 10*3/uL (ref 145–400)
RBC: 2.98 10*6/uL — AB (ref 3.70–5.45)
RDW: 16.9 % — ABNORMAL HIGH (ref 11.2–14.5)
WBC: 4.9 10*3/uL (ref 3.9–10.3)
lymph#: 0.7 10*3/uL — ABNORMAL LOW (ref 0.9–3.3)
nRBC: 0 % (ref 0–0)

## 2015-04-13 LAB — PREPARE RBC (CROSSMATCH)

## 2015-04-13 NOTE — Telephone Encounter (Signed)
INR 1.7  Current coumadin dose is 2.5 mg alt 5 mg  Next lab 04/19/2015  Per MD continue current dose.  Per noted decrease in heme with stable ferritin set pt up for 1 unit PRBC's. Recheck lab next week.  Above discussed with pt and appointments arranged.

## 2015-04-14 ENCOUNTER — Ambulatory Visit (HOSPITAL_BASED_OUTPATIENT_CLINIC_OR_DEPARTMENT_OTHER): Payer: Medicare Other

## 2015-04-14 VITALS — BP 134/54 | HR 53 | Temp 97.8°F | Resp 18

## 2015-04-14 DIAGNOSIS — D649 Anemia, unspecified: Secondary | ICD-10-CM

## 2015-04-14 MED ORDER — SODIUM CHLORIDE 0.9 % IJ SOLN
10.0000 mL | INTRAMUSCULAR | Status: AC | PRN
  Administered 2015-04-14: 10 mL
  Filled 2015-04-14: qty 10

## 2015-04-14 MED ORDER — SODIUM CHLORIDE 0.9 % IV SOLN
250.0000 mL | Freq: Once | INTRAVENOUS | Status: AC
  Administered 2015-04-14: 250 mL via INTRAVENOUS

## 2015-04-14 MED ORDER — ACETAMINOPHEN 325 MG PO TABS
ORAL_TABLET | ORAL | Status: AC
  Filled 2015-04-14: qty 2

## 2015-04-14 MED ORDER — HEPARIN SOD (PORK) LOCK FLUSH 100 UNIT/ML IV SOLN
250.0000 [IU] | INTRAVENOUS | Status: AC | PRN
  Administered 2015-04-14: 250 [IU]
  Filled 2015-04-14: qty 5

## 2015-04-14 MED ORDER — ACETAMINOPHEN 325 MG PO TABS
650.0000 mg | ORAL_TABLET | Freq: Once | ORAL | Status: AC
  Administered 2015-04-14: 650 mg via ORAL

## 2015-04-14 MED ORDER — DIPHENHYDRAMINE HCL 25 MG PO CAPS
ORAL_CAPSULE | ORAL | Status: AC
Start: 2015-04-14 — End: 2015-04-14
  Filled 2015-04-14: qty 1

## 2015-04-14 MED ORDER — SODIUM CHLORIDE 0.9 % IJ SOLN
3.0000 mL | INTRAMUSCULAR | Status: DC | PRN
  Filled 2015-04-14: qty 10

## 2015-04-14 MED ORDER — DIPHENHYDRAMINE HCL 25 MG PO CAPS
25.0000 mg | ORAL_CAPSULE | Freq: Once | ORAL | Status: AC
  Administered 2015-04-14: 25 mg via ORAL

## 2015-04-14 NOTE — Patient Instructions (Signed)

## 2015-04-14 NOTE — Progress Notes (Signed)
Pt tolerated 1 unit prbc today. Denies any complaints at this time. Printed pt AVS and PAC deaccessed wo problems.

## 2015-04-17 LAB — TYPE AND SCREEN
ABO/RH(D): O POS
Antibody Screen: NEGATIVE
Unit division: 0

## 2015-04-18 ENCOUNTER — Telehealth: Payer: Self-pay | Admitting: Oncology

## 2015-04-18 NOTE — Telephone Encounter (Signed)
Returned patients call re:1/18 lab

## 2015-04-19 ENCOUNTER — Other Ambulatory Visit: Payer: Self-pay

## 2015-04-19 ENCOUNTER — Other Ambulatory Visit (HOSPITAL_BASED_OUTPATIENT_CLINIC_OR_DEPARTMENT_OTHER): Payer: Medicare Other

## 2015-04-19 DIAGNOSIS — D500 Iron deficiency anemia secondary to blood loss (chronic): Secondary | ICD-10-CM

## 2015-04-19 DIAGNOSIS — N390 Urinary tract infection, site not specified: Secondary | ICD-10-CM

## 2015-04-19 DIAGNOSIS — I4891 Unspecified atrial fibrillation: Secondary | ICD-10-CM | POA: Diagnosis present

## 2015-04-19 DIAGNOSIS — E871 Hypo-osmolality and hyponatremia: Secondary | ICD-10-CM

## 2015-04-19 DIAGNOSIS — D689 Coagulation defect, unspecified: Secondary | ICD-10-CM

## 2015-04-19 DIAGNOSIS — K2961 Other gastritis with bleeding: Secondary | ICD-10-CM

## 2015-04-19 DIAGNOSIS — I119 Hypertensive heart disease without heart failure: Secondary | ICD-10-CM

## 2015-04-19 DIAGNOSIS — Z7901 Long term (current) use of anticoagulants: Secondary | ICD-10-CM

## 2015-04-19 DIAGNOSIS — Z954 Presence of other heart-valve replacement: Secondary | ICD-10-CM

## 2015-04-19 LAB — CBC WITH DIFFERENTIAL/PLATELET
BASO%: 0.4 % (ref 0.0–2.0)
Basophils Absolute: 0 10*3/uL (ref 0.0–0.1)
EOS%: 1.3 % (ref 0.0–7.0)
Eosinophils Absolute: 0.1 10*3/uL (ref 0.0–0.5)
HEMATOCRIT: 32.8 % — AB (ref 34.8–46.6)
HEMOGLOBIN: 10.6 g/dL — AB (ref 11.6–15.9)
LYMPH#: 0.8 10*3/uL — AB (ref 0.9–3.3)
LYMPH%: 14 % (ref 14.0–49.7)
MCH: 32.4 pg (ref 25.1–34.0)
MCHC: 32.3 g/dL (ref 31.5–36.0)
MCV: 100.3 fL (ref 79.5–101.0)
MONO#: 0.4 10*3/uL (ref 0.1–0.9)
MONO%: 8 % (ref 0.0–14.0)
NEUT%: 76.3 % (ref 38.4–76.8)
NEUTROS ABS: 4.1 10*3/uL (ref 1.5–6.5)
NRBC: 0 % (ref 0–0)
PLATELETS: 211 10*3/uL (ref 145–400)
RBC: 3.27 10*6/uL — ABNORMAL LOW (ref 3.70–5.45)
RDW: 17.2 % — AB (ref 11.2–14.5)
WBC: 5.4 10*3/uL (ref 3.9–10.3)

## 2015-04-19 LAB — PROTIME-INR
INR: 1.8 — AB (ref 2.00–3.50)
Protime: 21.6 Seconds — ABNORMAL HIGH (ref 10.6–13.4)

## 2015-04-19 NOTE — Progress Notes (Signed)
INR today - 1.80 Present coumadin dose- 2.5 mg alt every other day with 5 mg New dose per Dr. Jana Hakim- 5 mg for two days then 2.5 mg for one day Patient to have blood recheck on 04/25/15 at 1030 - POF entered Writer wrote out schedule for patient, patient stated understanding Critical HGB 10.6  Dr. Jana Hakim aware.  Will check the cbc again on Tuesday 04/25/15

## 2015-04-20 ENCOUNTER — Telehealth: Payer: Self-pay | Admitting: Oncology

## 2015-04-20 NOTE — Telephone Encounter (Signed)
Spoke with patient and she is aware of her 1/24 lab

## 2015-04-21 ENCOUNTER — Other Ambulatory Visit: Payer: Self-pay | Admitting: Cardiology

## 2015-04-25 ENCOUNTER — Other Ambulatory Visit (HOSPITAL_BASED_OUTPATIENT_CLINIC_OR_DEPARTMENT_OTHER): Payer: Medicare Other

## 2015-04-25 ENCOUNTER — Ambulatory Visit: Payer: Self-pay

## 2015-04-25 ENCOUNTER — Telehealth: Payer: Self-pay

## 2015-04-25 ENCOUNTER — Other Ambulatory Visit: Payer: Self-pay

## 2015-04-25 DIAGNOSIS — Z7901 Long term (current) use of anticoagulants: Secondary | ICD-10-CM

## 2015-04-25 DIAGNOSIS — K2961 Other gastritis with bleeding: Secondary | ICD-10-CM

## 2015-04-25 DIAGNOSIS — Z954 Presence of other heart-valve replacement: Secondary | ICD-10-CM

## 2015-04-25 DIAGNOSIS — I4891 Unspecified atrial fibrillation: Secondary | ICD-10-CM | POA: Diagnosis present

## 2015-04-25 DIAGNOSIS — D500 Iron deficiency anemia secondary to blood loss (chronic): Secondary | ICD-10-CM

## 2015-04-25 DIAGNOSIS — D689 Coagulation defect, unspecified: Secondary | ICD-10-CM

## 2015-04-25 LAB — CBC WITH DIFFERENTIAL/PLATELET
BASO%: 0.2 % (ref 0.0–2.0)
BASOS ABS: 0 10*3/uL (ref 0.0–0.1)
EOS%: 1.5 % (ref 0.0–7.0)
Eosinophils Absolute: 0.1 10*3/uL (ref 0.0–0.5)
HCT: 34.9 % (ref 34.8–46.6)
HGB: 11.1 g/dL — ABNORMAL LOW (ref 11.6–15.9)
LYMPH%: 19.5 % (ref 14.0–49.7)
MCH: 32 pg (ref 25.1–34.0)
MCHC: 31.8 g/dL (ref 31.5–36.0)
MCV: 100.6 fL (ref 79.5–101.0)
MONO#: 0.3 10*3/uL (ref 0.1–0.9)
MONO%: 6.3 % (ref 0.0–14.0)
NEUT#: 3.4 10*3/uL (ref 1.5–6.5)
NEUT%: 72.5 % (ref 38.4–76.8)
Platelets: 207 10*3/uL (ref 145–400)
RBC: 3.47 10*6/uL — AB (ref 3.70–5.45)
RDW: 16.4 % — ABNORMAL HIGH (ref 11.2–14.5)
WBC: 4.6 10*3/uL (ref 3.9–10.3)
lymph#: 0.9 10*3/uL (ref 0.9–3.3)
nRBC: 0 % (ref 0–0)

## 2015-04-25 LAB — PROTIME-INR
INR: 2.4 (ref 2.00–3.50)
PROTIME: 28.8 s — AB (ref 10.6–13.4)

## 2015-04-25 NOTE — Progress Notes (Signed)
INR today-2.4 Present Dose- 2. 5 mg alt 5 mg Patient to stay on same dose and have repeat lab in 2 weeks.   Terri, RN spoke with patient who stated understanding

## 2015-04-25 NOTE — Telephone Encounter (Signed)
Called Patient and notified Patient of upcoming Scheduled Appointment.       [facility].

## 2015-04-26 ENCOUNTER — Other Ambulatory Visit: Payer: Self-pay | Admitting: Cardiology

## 2015-04-26 DIAGNOSIS — M150 Primary generalized (osteo)arthritis: Principal | ICD-10-CM

## 2015-04-26 DIAGNOSIS — M159 Polyosteoarthritis, unspecified: Secondary | ICD-10-CM

## 2015-05-02 ENCOUNTER — Telehealth: Payer: Self-pay | Admitting: *Deleted

## 2015-05-02 ENCOUNTER — Other Ambulatory Visit: Payer: Self-pay | Admitting: *Deleted

## 2015-05-02 ENCOUNTER — Ambulatory Visit (HOSPITAL_BASED_OUTPATIENT_CLINIC_OR_DEPARTMENT_OTHER): Payer: Medicare Other

## 2015-05-02 ENCOUNTER — Telehealth: Payer: Self-pay | Admitting: Oncology

## 2015-05-02 DIAGNOSIS — D689 Coagulation defect, unspecified: Secondary | ICD-10-CM | POA: Diagnosis present

## 2015-05-02 DIAGNOSIS — I6330 Cerebral infarction due to thrombosis of unspecified cerebral artery: Secondary | ICD-10-CM

## 2015-05-02 DIAGNOSIS — D62 Acute posthemorrhagic anemia: Secondary | ICD-10-CM | POA: Diagnosis not present

## 2015-05-02 DIAGNOSIS — D500 Iron deficiency anemia secondary to blood loss (chronic): Secondary | ICD-10-CM

## 2015-05-02 DIAGNOSIS — D649 Anemia, unspecified: Secondary | ICD-10-CM | POA: Diagnosis not present

## 2015-05-02 LAB — CBC WITH DIFFERENTIAL/PLATELET
BASO%: 0.6 % (ref 0.0–2.0)
BASOS ABS: 0 10*3/uL (ref 0.0–0.1)
EOS ABS: 0 10*3/uL (ref 0.0–0.5)
EOS%: 0.9 % (ref 0.0–7.0)
HCT: 31.3 % — ABNORMAL LOW (ref 34.8–46.6)
HGB: 10.2 g/dL — ABNORMAL LOW (ref 11.6–15.9)
LYMPH#: 0.7 10*3/uL — AB (ref 0.9–3.3)
LYMPH%: 13.4 % — ABNORMAL LOW (ref 14.0–49.7)
MCH: 32.5 pg (ref 25.1–34.0)
MCHC: 32.5 g/dL (ref 31.5–36.0)
MCV: 100 fL (ref 79.5–101.0)
MONO#: 0.3 10*3/uL (ref 0.1–0.9)
MONO%: 5.6 % (ref 0.0–14.0)
NEUT%: 79.5 % — AB (ref 38.4–76.8)
NEUTROS ABS: 4.2 10*3/uL (ref 1.5–6.5)
PLATELETS: 185 10*3/uL (ref 145–400)
RBC: 3.13 10*6/uL — AB (ref 3.70–5.45)
RDW: 16.8 % — AB (ref 11.2–14.5)
WBC: 5.2 10*3/uL (ref 3.9–10.3)

## 2015-05-02 LAB — PROTIME-INR
INR: 1.8 — AB (ref 2.00–3.50)
PROTIME: 21.6 s — AB (ref 10.6–13.4)

## 2015-05-02 LAB — SAMPLE TO BLOOD BANK

## 2015-05-02 LAB — POTASSIUM (CC13): POTASSIUM: 5.2 meq/L — AB (ref 3.5–5.1)

## 2015-05-02 LAB — FERRITIN: FERRITIN: 88 ng/mL (ref 9–269)

## 2015-05-02 NOTE — Telephone Encounter (Signed)
Pt came in today for lab due to ongoing melena. Had request per Dr Felipa Eth for potassium level with lab draw.  Obtained above with noted reading of 5.2.  Pt contacted and states she is not on supplemental prescription potassium. Dr Felipa Eth changed pt's medication to spirolactone.  This RN called to Dr Carlyle Lipa and left a message on VM for [place] regarding above and faxed results as well.

## 2015-05-02 NOTE — Telephone Encounter (Signed)
Added labs for today per 1/31 pof @ 12 pm. Patient en route

## 2015-05-02 NOTE — Telephone Encounter (Signed)
Left message for patient re appointments for 2/1

## 2015-05-02 NOTE — Telephone Encounter (Signed)
"  I was in last Tuesday and having black stools ever since with abdominal cramps.  I take coumadin 5 mg two days in a row followed by one day of 2.5 mg and repeat.  When my blood level drops to 9 he will give a ferritin infusion.  My PCP Dr. Felipa Eth has given me a script for potassium check the next time I have labs drawn there.  The stools are not new, they let up sometimes, come and go, but have been black since Tuesday this time." P.O.F generated.  Patient will wait after lab at 12:00 today for collaborative nurse assessment.

## 2015-05-02 NOTE — Telephone Encounter (Signed)
Pt came in for labs and review by RN and MD.  Pt denies any SOB, mild dizziness with standing but recovers quickly.  Orthostatic BP obtained -  Per MD recommendation -  Schedule pt for lab and possible blood transfusion tomorrow.  If heme holding will probably give feraheme instead.  No change in coumadin dose.  This RN informed pt of above with verbalized understanding.  POF sent and VM left on treatment room scheduler's VM.

## 2015-05-03 ENCOUNTER — Other Ambulatory Visit: Payer: Self-pay

## 2015-05-03 ENCOUNTER — Other Ambulatory Visit: Payer: Medicare Other

## 2015-05-03 ENCOUNTER — Ambulatory Visit (HOSPITAL_COMMUNITY)
Admission: RE | Admit: 2015-05-03 | Discharge: 2015-05-03 | Disposition: A | Discharge status: Home / Self Care | Payer: Medicare Other | Source: Ambulatory Visit | Attending: Oncology | Admitting: Oncology

## 2015-05-03 ENCOUNTER — Ambulatory Visit (HOSPITAL_BASED_OUTPATIENT_CLINIC_OR_DEPARTMENT_OTHER): Payer: Medicare Other

## 2015-05-03 ENCOUNTER — Ambulatory Visit: Payer: Self-pay

## 2015-05-03 ENCOUNTER — Other Ambulatory Visit (HOSPITAL_BASED_OUTPATIENT_CLINIC_OR_DEPARTMENT_OTHER): Payer: Medicare Other

## 2015-05-03 VITALS — BP 122/57 | HR 65 | Temp 97.6°F | Resp 20

## 2015-05-03 DIAGNOSIS — D500 Iron deficiency anemia secondary to blood loss (chronic): Secondary | ICD-10-CM

## 2015-05-03 DIAGNOSIS — D62 Acute posthemorrhagic anemia: Secondary | ICD-10-CM

## 2015-05-03 DIAGNOSIS — D689 Coagulation defect, unspecified: Secondary | ICD-10-CM

## 2015-05-03 DIAGNOSIS — I6330 Cerebral infarction due to thrombosis of unspecified cerebral artery: Secondary | ICD-10-CM

## 2015-05-03 LAB — CBC WITH DIFFERENTIAL/PLATELET
BASO%: 0.3 % (ref 0.0–2.0)
Basophils Absolute: 0 10*3/uL (ref 0.0–0.1)
EOS ABS: 0.1 10*3/uL (ref 0.0–0.5)
EOS%: 1.5 % (ref 0.0–7.0)
HEMATOCRIT: 28.4 % — AB (ref 34.8–46.6)
HEMOGLOBIN: 9.2 g/dL — AB (ref 11.6–15.9)
LYMPH#: 0.5 10*3/uL — AB (ref 0.9–3.3)
LYMPH%: 12.9 % — AB (ref 14.0–49.7)
MCH: 32.9 pg (ref 25.1–34.0)
MCHC: 32.4 g/dL (ref 31.5–36.0)
MCV: 101.4 fL — ABNORMAL HIGH (ref 79.5–101.0)
MONO#: 0.2 10*3/uL (ref 0.1–0.9)
MONO%: 6.2 % (ref 0.0–14.0)
NEUT%: 79.1 % — ABNORMAL HIGH (ref 38.4–76.8)
NEUTROS ABS: 3.1 10*3/uL (ref 1.5–6.5)
PLATELETS: 176 10*3/uL (ref 145–400)
RBC: 2.8 10*6/uL — ABNORMAL LOW (ref 3.70–5.45)
RDW: 16.2 % — ABNORMAL HIGH (ref 11.2–14.5)
WBC: 3.9 10*3/uL (ref 3.9–10.3)
nRBC: 0 % (ref 0–0)

## 2015-05-03 LAB — PROTIME-INR
INR: 1.9 — AB (ref 2.00–3.50)
Protime: 22.8 Seconds — ABNORMAL HIGH (ref 10.6–13.4)

## 2015-05-03 LAB — PREPARE RBC (CROSSMATCH)

## 2015-05-03 MED ORDER — HEPARIN SOD (PORK) LOCK FLUSH 100 UNIT/ML IV SOLN
500.0000 [IU] | Freq: Every day | INTRAVENOUS | Status: AC | PRN
  Administered 2015-05-03: 500 [IU]
  Filled 2015-05-03: qty 5

## 2015-05-03 MED ORDER — SODIUM CHLORIDE 0.9 % IV SOLN
510.0000 mg | Freq: Once | INTRAVENOUS | Status: AC
  Administered 2015-05-03: 510 mg via INTRAVENOUS
  Filled 2015-05-03: qty 17

## 2015-05-03 MED ORDER — DIPHENHYDRAMINE HCL 25 MG PO CAPS
ORAL_CAPSULE | ORAL | Status: AC
  Filled 2015-05-03: qty 1

## 2015-05-03 MED ORDER — SODIUM CHLORIDE 0.9 % IV SOLN
Freq: Once | INTRAVENOUS | Status: AC
  Administered 2015-05-03: 10:00:00 via INTRAVENOUS

## 2015-05-03 MED ORDER — DIPHENHYDRAMINE HCL 25 MG PO CAPS
25.0000 mg | ORAL_CAPSULE | Freq: Once | ORAL | Status: AC
  Administered 2015-05-03: 25 mg via ORAL

## 2015-05-03 MED ORDER — DIPHENHYDRAMINE HCL 25 MG PO CAPS: 25.0000 mg | ORAL_CAPSULE | Freq: Once | ORAL | Status: DC

## 2015-05-03 MED ORDER — ACETAMINOPHEN 325 MG PO TABS
ORAL_TABLET | ORAL | Status: AC
  Filled 2015-05-03: qty 2

## 2015-05-03 MED ORDER — SODIUM CHLORIDE 0.9% FLUSH
10.0000 mL | INTRAVENOUS | Status: AC | PRN
  Administered 2015-05-03: 10 mL
  Filled 2015-05-03: qty 10

## 2015-05-03 MED ORDER — ACETAMINOPHEN 325 MG PO TABS
650.0000 mg | ORAL_TABLET | Freq: Once | ORAL | Status: AC
  Administered 2015-05-03: 650 mg via ORAL

## 2015-05-03 NOTE — Progress Notes (Signed)
INR today 1.9 Present dose of coumadin-  5 mg x's 2 days then 2.5 mg x's 1 day. Per Dr. Jana Hakim patient to stay on same dose. Labs rechecked on 05/09/15.  Patient states understanding.

## 2015-05-03 NOTE — Patient Instructions (Signed)
Blood Transfusion  A blood transfusion is a procedure in which you receive donated blood through an IV tube. You may need a blood transfusion because of illness, surgery, or injury. The blood may come from a donor, or it may be your own blood that you donated previously. The blood given in a transfusion is made up of different types of cells. You may receive:  Red blood cells. These carry oxygen and replace lost blood.  Platelets. These control bleeding.  Plasma. Thishelps blood to clot. If you have hemophilia or another clotting disorder, you may also receive other types of blood products. LET YOUR HEALTH CARE PROVIDER KNOW ABOUT:  Any allergies you have.  All medicines you are taking, including vitamins, herbs, eye drops, creams, and over-the-counter medicines.  Previous problems you or members of your family have had with the use of anesthetics.  Any blood disorders you have.  Previous surgeries you have had.  Any medical conditions you may have.  Any previous reactions you have had during a blood transfusion.  RISKS AND COMPLICATIONS Generally, this is a safe procedure. However, problems may occur, including:  Having an allergic reaction to something in the donated blood.  Fever. This may be a reaction to the white blood cells in the transfused blood.  Iron overload. This can happen from having many transfusions.  Transfusion-related acute lung injury (TRALI). This is a rare reaction that causes lung damage. The cause is not known.TRALI can occur within hours of a transfusion or several days later.  Sudden (acute) or delayed hemolytic reactions. This happens if your blood does not match the cells in your transfusion. Your body's defense system (immune system) may try to attack the new cells. This complication is rare.  Infection. This is rare. BEFORE THE PROCEDURE  You may have a blood test to determine your blood type. This is necessary to know what kind of blood your  body will accept.  If you are going to have a planned surgery, you may donate your own blood. This may be done in case you need to have a transfusion.  If you have had an allergic reaction to a transfusion in the past, you may be given medicine to help prevent a reaction. Take this medicine only as directed by your health care provider.  You will have your temperature, blood pressure, and pulse monitored before the transfusion. PROCEDURE   An IV will be started in your hand or arm.  The bag of donated blood will be attached to your IV tube and given into your vein.  Your temperature, blood pressure, and pulse will be monitored regularly during the transfusion. This monitoring is done to detect early signs of a transfusion reaction.  If you have any signs or symptoms of a reaction, your transfusion will be stopped and you may be given medicine.  When the transfusion is over, your IV will be removed.  Pressure may be applied to the IV site for a few minutes.  A bandage (dressing) will be applied. The procedure may vary among health care providers and hospitals. AFTER THE PROCEDURE  Your blood pressure, temperature, and pulse will be monitored regularly.   This information is not intended to replace advice given to you by your health care provider. Make sure you discuss any questions you have with your health care provider.   Document Released: 03/15/2000 Document Revised: 04/08/2014 Document Reviewed: 01/26/2014 Elsevier Interactive Patient Education 2016 Elsevier Inc.  Ferumoxytol injection What is this medicine? FERUMOXYTOL   is an iron complex. Iron is used to make healthy red blood cells, which carry oxygen and nutrients throughout the body. This medicine is used to treat iron deficiency anemia in people with chronic kidney disease. This medicine may be used for other purposes; ask your health care provider or pharmacist if you have questions. What should I tell my health care  provider before I take this medicine? They need to know if you have any of these conditions: -anemia not caused by low iron levels -high levels of iron in the blood -magnetic resonance imaging (MRI) test scheduled -an unusual or allergic reaction to iron, other medicines, foods, dyes, or preservatives -pregnant or trying to get pregnant -breast-feeding How should I use this medicine? This medicine is for injection into a vein. It is given by a health care professional in a hospital or clinic setting. Talk to your pediatrician regarding the use of this medicine in children. Special care may be needed. Overdosage: If you think you have taken too much of this medicine contact a poison control center or emergency room at once. NOTE: This medicine is only for you. Do not share this medicine with others. What if I miss a dose? It is important not to miss your dose. Call your doctor or health care professional if you are unable to keep an appointment. What may interact with this medicine? This medicine may interact with the following medications: -other iron products This list may not describe all possible interactions. Give your health care provider a list of all the medicines, herbs, non-prescription drugs, or dietary supplements you use. Also tell them if you smoke, drink alcohol, or use illegal drugs. Some items may interact with your medicine. What should I watch for while using this medicine? Visit your doctor or healthcare professional regularly. Tell your doctor or healthcare professional if your symptoms do not start to get better or if they get worse. You may need blood work done while you are taking this medicine. You may need to follow a special diet. Talk to your doctor. Foods that contain iron include: whole grains/cereals, dried fruits, beans, or peas, leafy green vegetables, and organ meats (liver, kidney). What side effects may I notice from receiving this medicine? Side effects that  you should report to your doctor or health care professional as soon as possible: -allergic reactions like skin rash, itching or hives, swelling of the face, lips, or tongue -breathing problems -changes in blood pressure -feeling faint or lightheaded, falls -fever or chills -flushing, sweating, or hot feelings -swelling of the ankles or feet Side effects that usually do not require medical attention (Report these to your doctor or health care professional if they continue or are bothersome.): -diarrhea -headache -nausea, vomiting -stomach pain This list may not describe all possible side effects. Call your doctor for medical advice about side effects. You may report side effects to FDA at 1-800-FDA-1088. Where should I keep my medicine? This drug is given in a hospital or clinic and will not be stored at home. NOTE: This sheet is a summary. It may not cover all possible information. If you have questions about this medicine, talk to your doctor, pharmacist, or health care provider.    2016, Elsevier/Gold Standard. (2011-11-01 15:23:36)   

## 2015-05-03 NOTE — Progress Notes (Signed)
Pt labs reviewed by Dr. Jana Hakim and pt to receive 2units prbc and dose of feraheme today. Pt agreeable to plan.

## 2015-05-04 ENCOUNTER — Ambulatory Visit: Payer: Medicare Other | Admitting: Cardiology

## 2015-05-04 ENCOUNTER — Encounter (HOSPITAL_COMMUNITY): Payer: Medicare Other

## 2015-05-04 LAB — TYPE AND SCREEN
ABO/RH(D): O POS
Antibody Screen: NEGATIVE
UNIT DIVISION: 0
UNIT DIVISION: 0

## 2015-05-08 ENCOUNTER — Other Ambulatory Visit: Payer: Self-pay

## 2015-05-08 DIAGNOSIS — E876 Hypokalemia: Secondary | ICD-10-CM

## 2015-05-08 DIAGNOSIS — I119 Hypertensive heart disease without heart failure: Secondary | ICD-10-CM

## 2015-05-09 ENCOUNTER — Ambulatory Visit: Payer: Self-pay

## 2015-05-09 ENCOUNTER — Other Ambulatory Visit (HOSPITAL_BASED_OUTPATIENT_CLINIC_OR_DEPARTMENT_OTHER): Payer: Medicare Other

## 2015-05-09 DIAGNOSIS — Z7901 Long term (current) use of anticoagulants: Secondary | ICD-10-CM

## 2015-05-09 DIAGNOSIS — D500 Iron deficiency anemia secondary to blood loss (chronic): Secondary | ICD-10-CM

## 2015-05-09 DIAGNOSIS — I4891 Unspecified atrial fibrillation: Secondary | ICD-10-CM

## 2015-05-09 DIAGNOSIS — I119 Hypertensive heart disease without heart failure: Secondary | ICD-10-CM

## 2015-05-09 DIAGNOSIS — K2961 Other gastritis with bleeding: Secondary | ICD-10-CM

## 2015-05-09 DIAGNOSIS — Z954 Presence of other heart-valve replacement: Secondary | ICD-10-CM

## 2015-05-09 DIAGNOSIS — D689 Coagulation defect, unspecified: Secondary | ICD-10-CM

## 2015-05-09 DIAGNOSIS — E876 Hypokalemia: Secondary | ICD-10-CM

## 2015-05-09 LAB — CBC WITH DIFFERENTIAL/PLATELET
BASO%: 0.4 % (ref 0.0–2.0)
BASOS ABS: 0 10*3/uL (ref 0.0–0.1)
EOS%: 1.2 % (ref 0.0–7.0)
Eosinophils Absolute: 0.1 10*3/uL (ref 0.0–0.5)
HCT: 36.5 % (ref 34.8–46.6)
HGB: 11.9 g/dL (ref 11.6–15.9)
LYMPH%: 12.2 % — AB (ref 14.0–49.7)
MCH: 32.1 pg (ref 25.1–34.0)
MCHC: 32.5 g/dL (ref 31.5–36.0)
MCV: 98.6 fL (ref 79.5–101.0)
MONO#: 0.3 10*3/uL (ref 0.1–0.9)
MONO%: 6.9 % (ref 0.0–14.0)
NEUT#: 3.9 10*3/uL (ref 1.5–6.5)
NEUT%: 79.3 % — AB (ref 38.4–76.8)
PLATELETS: 176 10*3/uL (ref 145–400)
RBC: 3.7 10*6/uL (ref 3.70–5.45)
RDW: 18.6 % — AB (ref 11.2–14.5)
WBC: 4.9 10*3/uL (ref 3.9–10.3)
lymph#: 0.6 10*3/uL — ABNORMAL LOW (ref 0.9–3.3)

## 2015-05-09 LAB — PROTIME-INR
INR: 1.9 — ABNORMAL LOW (ref 2.00–3.50)
Protime: 22.8 Seconds — ABNORMAL HIGH (ref 10.6–13.4)

## 2015-05-09 LAB — FERRITIN: FERRITIN: 572 ng/mL — AB (ref 9–269)

## 2015-05-09 LAB — POTASSIUM (CC13): POTASSIUM: 4.9 meq/L (ref 3.5–5.1)

## 2015-05-09 NOTE — Progress Notes (Signed)
INR today- 1.9 Present coumadin dose is ; 5 mg times two day then 2.5 mg one day Dr. Jana Hakim would like patient to stay on same dose and check lab in one week.  Potassium results faxed to Dr. Carlyle Lipa office per patient's request.  Patient notified of result of potassium level.

## 2015-05-10 ENCOUNTER — Telehealth: Payer: Self-pay | Admitting: Cardiovascular Disease

## 2015-05-10 ENCOUNTER — Telehealth: Payer: Self-pay | Admitting: Oncology

## 2015-05-10 NOTE — Telephone Encounter (Signed)
Received records from [place] for appointment on 06/30/15 with Dr Oval Linsey.  Records given to [facility] (medical records) for Dr Blenda Mounts schedule on 06/02/15. lp

## 2015-05-10 NOTE — Telephone Encounter (Signed)
Called and left a message with new lab appointment

## 2015-05-16 ENCOUNTER — Other Ambulatory Visit: Payer: Self-pay

## 2015-05-16 ENCOUNTER — Other Ambulatory Visit (HOSPITAL_BASED_OUTPATIENT_CLINIC_OR_DEPARTMENT_OTHER): Payer: Medicare Other

## 2015-05-16 ENCOUNTER — Ambulatory Visit: Payer: Self-pay

## 2015-05-16 ENCOUNTER — Telehealth: Payer: Self-pay | Admitting: Oncology

## 2015-05-16 DIAGNOSIS — Z954 Presence of other heart-valve replacement: Secondary | ICD-10-CM

## 2015-05-16 DIAGNOSIS — D689 Coagulation defect, unspecified: Secondary | ICD-10-CM | POA: Diagnosis not present

## 2015-05-16 DIAGNOSIS — K2961 Other gastritis with bleeding: Secondary | ICD-10-CM

## 2015-05-16 DIAGNOSIS — Z7901 Long term (current) use of anticoagulants: Secondary | ICD-10-CM

## 2015-05-16 DIAGNOSIS — D500 Iron deficiency anemia secondary to blood loss (chronic): Secondary | ICD-10-CM

## 2015-05-16 DIAGNOSIS — I4891 Unspecified atrial fibrillation: Secondary | ICD-10-CM | POA: Diagnosis not present

## 2015-05-16 LAB — PROTIME-INR
INR: 1.9 — ABNORMAL LOW (ref 2.00–3.50)
Protime: 22.8 Seconds — ABNORMAL HIGH (ref 10.6–13.4)

## 2015-05-16 LAB — CBC WITH DIFFERENTIAL/PLATELET
BASO%: 0.2 % (ref 0.0–2.0)
Basophils Absolute: 0 10e3/uL (ref 0.0–0.1)
EOS%: 1.1 % (ref 0.0–7.0)
Eosinophils Absolute: 0.1 10e3/uL (ref 0.0–0.5)
HCT: 31.3 % — ABNORMAL LOW (ref 34.8–46.6)
HGB: 10.3 g/dL — ABNORMAL LOW (ref 11.6–15.9)
LYMPH%: 12.6 % — ABNORMAL LOW (ref 14.0–49.7)
MCH: 32.6 pg (ref 25.1–34.0)
MCHC: 32.9 g/dL (ref 31.5–36.0)
MCV: 99.1 fL (ref 79.5–101.0)
MONO#: 0.4 10e3/uL (ref 0.1–0.9)
MONO%: 6.5 % (ref 0.0–14.0)
NEUT#: 5.1 10e3/uL (ref 1.5–6.5)
NEUT%: 79.6 % — ABNORMAL HIGH (ref 38.4–76.8)
Platelets: 179 10e3/uL (ref 145–400)
RBC: 3.16 10e6/uL — ABNORMAL LOW (ref 3.70–5.45)
RDW: 16.8 % — ABNORMAL HIGH (ref 11.2–14.5)
WBC: 6.4 10e3/uL (ref 3.9–10.3)
lymph#: 0.8 10e3/uL — ABNORMAL LOW (ref 0.9–3.3)
nRBC: 0 % (ref 0–0)

## 2015-05-16 NOTE — Telephone Encounter (Signed)
per pof to sch pt appt*cld pt and left message of time & date of appt for 2/17

## 2015-05-16 NOTE — Progress Notes (Signed)
INR today 1.9 Present coumadin dose: 5 mg for 2 days then 2.5 mg for one day alternating days. Dr. Jana Hakim would like patient to stay on same dose. Patient to be checked again next Tuesday 05/23/15

## 2015-05-17 ENCOUNTER — Other Ambulatory Visit: Payer: Medicare Other

## 2015-05-19 ENCOUNTER — Other Ambulatory Visit: Payer: Self-pay | Admitting: Oncology

## 2015-05-19 ENCOUNTER — Ambulatory Visit: Payer: Self-pay

## 2015-05-19 ENCOUNTER — Telehealth: Payer: Self-pay | Admitting: Oncology

## 2015-05-19 ENCOUNTER — Other Ambulatory Visit (HOSPITAL_BASED_OUTPATIENT_CLINIC_OR_DEPARTMENT_OTHER): Payer: Medicare Other

## 2015-05-19 ENCOUNTER — Ambulatory Visit: Payer: Medicare Other

## 2015-05-19 DIAGNOSIS — D500 Iron deficiency anemia secondary to blood loss (chronic): Secondary | ICD-10-CM

## 2015-05-19 DIAGNOSIS — D689 Coagulation defect, unspecified: Secondary | ICD-10-CM

## 2015-05-19 DIAGNOSIS — I482 Chronic atrial fibrillation, unspecified: Secondary | ICD-10-CM

## 2015-05-19 DIAGNOSIS — Z954 Presence of other heart-valve replacement: Secondary | ICD-10-CM

## 2015-05-19 DIAGNOSIS — Z7901 Long term (current) use of anticoagulants: Secondary | ICD-10-CM

## 2015-05-19 DIAGNOSIS — I4891 Unspecified atrial fibrillation: Secondary | ICD-10-CM

## 2015-05-19 DIAGNOSIS — K2961 Other gastritis with bleeding: Secondary | ICD-10-CM

## 2015-05-19 DIAGNOSIS — T148XXA Other injury of unspecified body region, initial encounter: Secondary | ICD-10-CM

## 2015-05-19 LAB — CBC WITH DIFFERENTIAL/PLATELET
BASO%: 0.2 % (ref 0.0–2.0)
BASOS ABS: 0 10*3/uL (ref 0.0–0.1)
EOS%: 1.3 % (ref 0.0–7.0)
Eosinophils Absolute: 0.1 10*3/uL (ref 0.0–0.5)
HCT: 34.3 % — ABNORMAL LOW (ref 34.8–46.6)
HGB: 11.2 g/dL — ABNORMAL LOW (ref 11.6–15.9)
LYMPH%: 11.6 % — ABNORMAL LOW (ref 14.0–49.7)
MCH: 32.8 pg (ref 25.1–34.0)
MCHC: 32.7 g/dL (ref 31.5–36.0)
MCV: 100.6 fL (ref 79.5–101.0)
MONO#: 0.3 10*3/uL (ref 0.1–0.9)
MONO%: 6.2 % (ref 0.0–14.0)
NEUT#: 3.6 10*3/uL (ref 1.5–6.5)
NEUT%: 80.7 % — AB (ref 38.4–76.8)
Platelets: 199 10*3/uL (ref 145–400)
RBC: 3.41 10*6/uL — ABNORMAL LOW (ref 3.70–5.45)
RDW: 17.3 % — ABNORMAL HIGH (ref 11.2–14.5)
WBC: 4.5 10*3/uL (ref 3.9–10.3)
lymph#: 0.5 10*3/uL — ABNORMAL LOW (ref 0.9–3.3)

## 2015-05-19 LAB — PROTIME-INR
INR: 1.8 — AB (ref 2.00–3.50)
PROTIME: 21.6 s — AB (ref 10.6–13.4)

## 2015-05-19 LAB — FERRITIN: FERRITIN: 298 ng/mL — AB (ref 9–269)

## 2015-05-19 NOTE — Progress Notes (Signed)
INR today 1.8 Present coumadin dose 5 mg x's 2 day alt with 2.5 mg x's 1 day Per Dr. Jana Hakim patient is to stay on same dose

## 2015-05-19 NOTE — Telephone Encounter (Signed)
Pt confirmed appt and received avs °

## 2015-05-19 NOTE — Progress Notes (Unsigned)
Discussed scenarios situation extensively with Dr. Watt Climes. Basically the choices to continue to take her and try to balance the risk of bleeding versus the risk of clotting, or try to resolve the GI bleed. The only place he really has seen AVMs is the cecum. Potentially this could be removed laparoscopically. He is thinking of doing a capsule endoscopy first to make sure there is no other site of bleeding, and if that is the case then we could consider setting her up for removal of the cecum if that is indeed for the bleeding is from. She would have a colonoscopy preop and if that does confirm AVMs there she could proceed directly with the same prep to surgery.  Obviously Deborah [name] is high risk and we would want to consider this very carefully before proceeding. I have made her a return appointment with me for March 3 so we have ample time to discuss this. In the meantime we will continue to support her with transfusions and iron infusions as needed and continue to keep her INR in the 1.8-2.0 range.

## 2015-05-20 ENCOUNTER — Other Ambulatory Visit: Payer: Self-pay | Admitting: Cardiology

## 2015-05-20 ENCOUNTER — Other Ambulatory Visit: Payer: Self-pay | Admitting: Oncology

## 2015-05-31 ENCOUNTER — Other Ambulatory Visit: Payer: Medicare Other

## 2015-06-02 ENCOUNTER — Other Ambulatory Visit (HOSPITAL_BASED_OUTPATIENT_CLINIC_OR_DEPARTMENT_OTHER): Payer: Medicare Other

## 2015-06-02 ENCOUNTER — Other Ambulatory Visit: Payer: Self-pay | Admitting: *Deleted

## 2015-06-02 ENCOUNTER — Ambulatory Visit (HOSPITAL_BASED_OUTPATIENT_CLINIC_OR_DEPARTMENT_OTHER): Payer: Medicare Other | Admitting: Oncology

## 2015-06-02 ENCOUNTER — Ambulatory Visit (HOSPITAL_BASED_OUTPATIENT_CLINIC_OR_DEPARTMENT_OTHER): Payer: Medicare Other

## 2015-06-02 ENCOUNTER — Ambulatory Visit (HOSPITAL_COMMUNITY)
Admission: RE | Admit: 2015-06-02 | Discharge: 2015-06-02 | Disposition: A | Discharge status: Home / Self Care | Payer: Medicare Other | Source: Ambulatory Visit | Attending: Oncology | Admitting: Oncology

## 2015-06-02 ENCOUNTER — Telehealth: Payer: Self-pay | Admitting: Oncology

## 2015-06-02 VITALS — BP 111/70 | HR 50 | Temp 98.2°F | Resp 18

## 2015-06-02 VITALS — BP 99/37 | HR 52 | Temp 97.4°F | Resp 17 | Ht 60.0 in | Wt 124.5 lb

## 2015-06-02 DIAGNOSIS — K2961 Other gastritis with bleeding: Secondary | ICD-10-CM

## 2015-06-02 DIAGNOSIS — D500 Iron deficiency anemia secondary to blood loss (chronic): Secondary | ICD-10-CM

## 2015-06-02 DIAGNOSIS — D509 Iron deficiency anemia, unspecified: Secondary | ICD-10-CM

## 2015-06-02 DIAGNOSIS — R195 Other fecal abnormalities: Secondary | ICD-10-CM | POA: Diagnosis not present

## 2015-06-02 DIAGNOSIS — D62 Acute posthemorrhagic anemia: Secondary | ICD-10-CM

## 2015-06-02 DIAGNOSIS — I4891 Unspecified atrial fibrillation: Secondary | ICD-10-CM | POA: Diagnosis not present

## 2015-06-02 DIAGNOSIS — D689 Coagulation defect, unspecified: Secondary | ICD-10-CM

## 2015-06-02 DIAGNOSIS — Z7901 Long term (current) use of anticoagulants: Secondary | ICD-10-CM

## 2015-06-02 DIAGNOSIS — K922 Gastrointestinal hemorrhage, unspecified: Secondary | ICD-10-CM | POA: Diagnosis not present

## 2015-06-02 DIAGNOSIS — I4819 Other persistent atrial fibrillation: Secondary | ICD-10-CM

## 2015-06-02 DIAGNOSIS — I6330 Cerebral infarction due to thrombosis of unspecified cerebral artery: Secondary | ICD-10-CM

## 2015-06-02 DIAGNOSIS — Z954 Presence of other heart-valve replacement: Secondary | ICD-10-CM

## 2015-06-02 DIAGNOSIS — D649 Anemia, unspecified: Secondary | ICD-10-CM

## 2015-06-02 LAB — CBC WITH DIFFERENTIAL/PLATELET
BASO%: 0.6 % (ref 0.0–2.0)
Basophils Absolute: 0 10*3/uL (ref 0.0–0.1)
EOS ABS: 0.1 10*3/uL (ref 0.0–0.5)
EOS%: 1.4 % (ref 0.0–7.0)
HEMATOCRIT: 29.1 % — AB (ref 34.8–46.6)
HEMOGLOBIN: 9.5 g/dL — AB (ref 11.6–15.9)
LYMPH#: 0.7 10*3/uL — AB (ref 0.9–3.3)
LYMPH%: 13.5 % — ABNORMAL LOW (ref 14.0–49.7)
MCH: 33.4 pg (ref 25.1–34.0)
MCHC: 32.5 g/dL (ref 31.5–36.0)
MCV: 102.9 fL — AB (ref 79.5–101.0)
MONO#: 0.4 10*3/uL (ref 0.1–0.9)
MONO%: 7.8 % (ref 0.0–14.0)
NEUT%: 76.7 % (ref 38.4–76.8)
NEUTROS ABS: 4.2 10*3/uL (ref 1.5–6.5)
PLATELETS: 180 10*3/uL (ref 145–400)
RBC: 2.83 10*6/uL — AB (ref 3.70–5.45)
RDW: 18.6 % — AB (ref 11.2–14.5)
WBC: 5.4 10*3/uL (ref 3.9–10.3)

## 2015-06-02 LAB — COMPREHENSIVE METABOLIC PANEL
ALT: 20 U/L (ref 0–55)
AST: 18 U/L (ref 5–34)
Albumin: 3.4 g/dL — ABNORMAL LOW (ref 3.5–5.0)
Alkaline Phosphatase: 50 U/L (ref 40–150)
Anion Gap: 8 mEq/L (ref 3–11)
BUN: 65.4 mg/dL — ABNORMAL HIGH (ref 7.0–26.0)
CHLORIDE: 104 meq/L (ref 98–109)
CO2: 21 meq/L — AB (ref 22–29)
Calcium: 9 mg/dL (ref 8.4–10.4)
Creatinine: 1.5 mg/dL — ABNORMAL HIGH (ref 0.6–1.1)
EGFR: 32 mL/min/{1.73_m2} — AB (ref >90)
GLUCOSE: 90 mg/dL (ref 70–140)
Potassium: 5.3 mEq/L — ABNORMAL HIGH (ref 3.5–5.1)
SODIUM: 132 meq/L — AB (ref 136–145)
TOTAL PROTEIN: 5.7 g/dL — AB (ref 6.4–8.3)

## 2015-06-02 LAB — PREPARE RBC (CROSSMATCH)

## 2015-06-02 LAB — PROTIME-INR
INR: 2.7 (ref 2.00–3.50)
Protime: 32.4 Seconds — ABNORMAL HIGH (ref 10.6–13.4)

## 2015-06-02 MED ORDER — ACETAMINOPHEN 325 MG PO TABS
ORAL_TABLET | ORAL | Status: AC
  Filled 2015-06-02: qty 2

## 2015-06-02 MED ORDER — DIPHENHYDRAMINE HCL 25 MG PO CAPS
25.0000 mg | ORAL_CAPSULE | Freq: Once | ORAL | Status: AC
  Administered 2015-06-02: 25 mg via ORAL

## 2015-06-02 MED ORDER — SODIUM CHLORIDE 0.9 % IV SOLN
250.0000 mL | Freq: Once | INTRAVENOUS | Status: AC
  Administered 2015-06-02: 250 mL via INTRAVENOUS

## 2015-06-02 MED ORDER — DIPHENHYDRAMINE HCL 25 MG PO CAPS
ORAL_CAPSULE | ORAL | Status: AC
  Filled 2015-06-02: qty 1

## 2015-06-02 MED ORDER — HEPARIN SOD (PORK) LOCK FLUSH 100 UNIT/ML IV SOLN
500.0000 [IU] | Freq: Once | INTRAVENOUS | Status: AC
  Administered 2015-06-02: 500 [IU] via INTRAVENOUS
  Filled 2015-06-02: qty 5

## 2015-06-02 MED ORDER — SODIUM CHLORIDE 0.9% FLUSH
10.0000 mL | INTRAVENOUS | Status: DC | PRN
  Administered 2015-06-02: 10 mL via INTRAVENOUS
  Filled 2015-06-02: qty 10

## 2015-06-02 MED ORDER — ACETAMINOPHEN 325 MG PO TABS
650.0000 mg | ORAL_TABLET | Freq: Once | ORAL | Status: AC
  Administered 2015-06-02: 650 mg via ORAL

## 2015-06-02 NOTE — Progress Notes (Signed)
ID: [name]   DOB: 1926/04/17  MR#: [id number]  HQI#:[id number]  PCP: Mathews Argyle, MD GYN:  SU:  OTHER MD: Clarene Essex, Rema Jasmine   HISTORY OF PRESENT ILLNESS: Deborah [name]") Deborah [name] is an 82 year old [place] woman referred April 2014  by Dr. Watt Climes for evaluation and treatment of iron deficiency anemia. The patient has a documented history of chronic GI bleeding, with prior GI evaluation showing a hiatal hernia, multiple gastric polyps, and diverticular disease. She has been treated with various iron preparations, most recently with Niferex, with poor tolerance. Despite everyone's best efforts her hemoglobin has continued to drop, from 12.4 on 06/03/2012 to10.6 on 07/01/2012 and 9.6.  The GI-bleeding problem is complicated by a history of AVR and A-fib, on chronic anticoagulation. She had what appears to have been a TIA September 2016.  Her subsequent history is as detailed below.  INTERVAL HISTORY: [name] returns today for followup of her diverticular disease and coagulopathy. She has been having black stools for the past few days and has also been feeling more tired. Part of the issue, she thinks, is that some of her blood pressure medications have been recently adjusted. She denies severe shortness of breath, dizziness, or falls. Her INR is in the therapeutic range, perhaps minimally higher than we would like it at this point.  REVIEW OF SYSTEMS: She remains normally active for her age, with many activities daily, and is planning to go to the beach with her family late next week. She denies unusual headaches or visual changes, nausea, or vomiting. She has a little bit of discomfort in her right lower quadrant, more like a cramp. She bruises easily. She has back and joint pain which she attributes to gout or arthritis, and which is not more intense or persistent than before. There has been no fever. She tells me a repeat capsule endoscopy is being planned. A  detailed review of systems today was otherwise stable.  PAST MEDICAL HISTORY: Past Medical History  Diagnosis Date  . Hypothyroidism   . Stomach problems     seeing Dr Watt Climes  . Hypertension   . GERD (gastroesophageal reflux disease)   . Anemia   . Arrhythmia     Atrial Fibrillation  . Breast cancer ([place])     "right; S/P lumpectomy, chemo, XRT"   . Complication of anesthesia     "once I'm awakened, I don't sleep til the following night"  . High cholesterol   . Heart murmur   . History of blood transfusion 1996; ~ 2013    "related to valve replacement; GI bleeding"   . Chronic lower GI bleeding   . Arthritis     "fingers, toes, hips; qwhere" (05/19/2013)  . Gout   . Pneumonia 07/2011  . Rectus sheath hematoma 12/19/14     held anticoagulation for 1 week, transfused  . TIA (transient ischemic attack) 12/24/14    symptoms resolved  . History of nuclear stress test     Myoview 1/17: EF 59%, normal perfusion, low risk  . History of echocardiogram     Echo 1/17: mild LVH, EF 50-55%, no RWMA, mild AI, mechanical AVR with mean 21 mmHg/peak 42 mmHg (stable since 2012), mod MR, mod to severe BAE, PASP 36 mmHg    PAST SURGICAL HISTORY: Past Surgical History  Procedure Laterality Date  . Arteriogram  01/12    NO RAS   . Cardioversion      X 2  . Cardiac catheterization    .  Cataract extraction w/ intraocular lens  implant, bilateral Bilateral     bilateral cataract with lens implants  . Cholecystectomy    . Abdominal hysterectomy    . Appendectomy    . Colonoscopy with propofol N/A 11/24/2012    Procedure: COLONOSCOPY WITH PROPOFOL;  Surgeon: Jeryl Columbia, MD;  Location: [facility];  Service: Endoscopy;  Laterality: N/A;  . Breast biopsy Right   . Breast lumpectomy Right   . Aortic valve replacement  1996    [employer]    FAMILY HISTORY Family History  Problem Relation Age of Onset  . Dementia Mother   . Coronary artery disease Father    the patient's father died from a  myocardial infarction at the age of 1. The patient's mother died with Alzheimer's disease at the age of 37. She had 2 brothers and one sister. Her sister had colon cancer diagnosed at age 71. Otherwise the only other person with cancer in the family was the patient's mother's twin sister, diagnosed with breast cancer at age 85.  GYNECOLOGIC HISTORY: Menarche age 21, first live birth age 59, she is [place] P3. Underwent menopause 1972. She took hormone replacement for approximately 21 years.  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 Her daughter Dixon Boos runs the [employer] here. Daughter [name] also lives in [place], is a homemaker. Son [name] "[name]" Flammer is a Scientist, research (medical). The patient has of 10 grandchildren She attends a [employer].   ADVANCED DIRECTIVES: Living will in place. Her daughter Dixon Boos is her healthcare power of attorney.  HEALTH MAINTENANCE: Social History  Substance Use Topics  . Smoking status: Former Smoker -- 1.00 packs/day for 20 years    Types: Cigarettes    Quit date: 04/01/1988  . Smokeless tobacco: Never Used  . Alcohol Use: 4.8 oz/week    4 Glasses of wine, 4 Shots of liquor per week     Comment: burbon or glass wine daily     Allergies  Allergen Reactions  . Amiodarone Shortness Of Breath  . Alendronate Sodium Other (See Comments)    GI upset  . Macrodantin [Nitrofurantoin] Other (See Comments)    GI upset  . Meclizine Swelling    tongue  . Norvasc [Amlodipine Besylate] Swelling    edema  . Tussionex Pennkinetic Er [Hydrocod Polst-Cpm Polst Er] Other (See Comments)    Reaction unknown    Current Outpatient Prescriptions  Medication Sig Dispense Refill  . metoprolol succinate (TOPROL-XL) 25 MG 24 hr tablet Take 25 mg by mouth daily.    Marland Kitchen acetaminophen (TYLENOL) 500 MG tablet Take 1,000 mg by mouth every 6 (six) hours as needed for headache.    . allopurinol (ZYLOPRIM) 300  MG tablet Take 300 mg by mouth daily.    Marland Kitchen amLODipine (NORVASC) 5 MG tablet Take 5 mg by mouth daily.     . calcium-vitamin D 500 MG tablet Take 1 tablet by mouth 2 (two) times daily.      . celecoxib (CELEBREX) 200 MG capsule Take 200 mg by mouth daily.  4  . cloNIDine (CATAPRES) 0.1 MG tablet Take 0.2 mg by mouth 2 (two) times daily.    Marland Kitchen dicyclomine (BENTYL) 10 MG capsule Take 10 mg by mouth daily as needed for spasms.     . digoxin (LANOXIN) 0.125 MG tablet Take 0.125 mg by mouth daily.    . fexofenadine (ALLEGRA ALLERGY) 180 MG tablet Take 180 mg by  mouth daily as needed for allergies.     . hydrochlorothiazide (MICROZIDE) 12.5 MG capsule Take 1 capsule (12.5 mg total) by mouth 2 (two) times daily. 180 capsule 3  . hydrOXYzine (ATARAX/VISTARIL) 25 MG tablet Take 25 mg by mouth 3 (three) times daily as needed (anxiety).    Marland Kitchen levothyroxine (SYNTHROID, LEVOTHROID) 88 MCG tablet Take 88 mcg by mouth daily before breakfast.    . lidocaine-prilocaine (EMLA) cream Apply 1 application topically as needed (prior to infusion at porta cath site). 30 g 1  . losartan (COZAAR) 50 MG tablet Take 1 tablet (50 mg total) by mouth 2 (two) times daily. 60 tablet 5  . multivitamin (THERAGRAN) per tablet Take 1 tablet by mouth daily.     Marland Kitchen NITROSTAT 0.4 MG SL tablet Place 0.4 mg under the tongue every 5 (five) minutes as needed for chest pain (x 3 doses).   1  . Omega-3 Fatty Acids (FISH OIL) 1000 MG CAPS Take 1,000 mg by mouth 2 (two) times daily.     . simvastatin (ZOCOR) 20 MG tablet TAKE ONE-HALF TABLET DAILY 30 tablet 3  . spironolactone (ALDACTONE) 25 MG tablet Take 25 mg by mouth 2 (two) times daily.  5  . warfarin (COUMADIN) 5 MG tablet Take 1 tablet (5 mg total) by mouth daily. 30 tablet 3  . zolpidem (AMBIEN CR) 12.5 MG CR tablet Take 12.5 mg by mouth at bedtime as needed for sleep.     No current facility-administered medications for this visit.    OBJECTIVE: Elderly white woman in no acute  distress Filed Vitals:   06/02/15 0942  BP: 99/37  Pulse: 52  Temp: 97.4 F (36.3 C)  Resp: 17     Body mass index is 24.31 kg/(m^2).    ECOG FS: 1  Sclerae unicteric, EOMs intact Oropharynx clear, dentition in good repair No cervical or supraclavicular adenopathy Lungs no rales or rhonchi Heart regular rate and rhythm Abd soft, nontender and specifically no right lower quadrant tenderness, positive bowel sounds MSK no focal spinal tenderness, no joint edema Neuro: nonfocal, well oriented, positive affect Breasts: Deferred    Lab Results  Component Value Date   WBC 5.4 06/02/2015   NEUTROABS 4.2 06/02/2015   HGB 9.5* 06/02/2015   HCT 29.1* 06/02/2015   MCV 102.9* 06/02/2015   PLT 180 06/02/2015      Chemistry      Component Value Date/Time   NA 132* 06/02/2015 0919   NA 133* 12/24/2014 2239   K 5.3* 06/02/2015 0919   K 4.2 12/24/2014 2239   CL 97* 12/24/2014 2239   CO2 21* 06/02/2015 0919   CO2 27 12/24/2014 2200   BUN 65.4* 06/02/2015 0919   BUN 33* 12/24/2014 2239   CREATININE 1.5* 06/02/2015 0919   CREATININE 0.90 12/24/2014 2239      Component Value Date/Time   CALCIUM 9.0 06/02/2015 0919   CALCIUM 9.8 12/24/2014 2200   ALKPHOS 50 06/02/2015 0919   ALKPHOS 65 12/24/2014 2200   AST 18 06/02/2015 0919   AST 46* 12/24/2014 2200   ALT 20 06/02/2015 0919   ALT 28 12/24/2014 2200   BILITOT <0.30 06/02/2015 0919   BILITOT 1.3* 12/24/2014 2200      No results found for: LABCA2  No components found for: LABCA125   Recent Labs Lab 06/02/15 0918  INR 2.70    Urinalysis    Component Value Date/Time   COLORURINE YELLOW 12/17/2014 1805   APPEARANCEUR CLOUDY* 12/17/2014 1805  LABSPEC 1.010 03/22/2015 0917   LABSPEC 1.010 12/17/2014 1805   PHURINE 6.0 03/22/2015 0917   PHURINE 6.0 12/17/2014 1805   GLUCOSEU Negative 03/22/2015 0917   GLUCOSEU NEGATIVE 12/17/2014 1805   GLUCOSEU NEGATIVE 04/07/2013 1123   HGBUR Negative 03/22/2015 0917   HGBUR  TRACE* 12/17/2014 1805   BILIRUBINUR Negative 03/22/2015 [place] 12/17/2014 1805   KETONESUR Negative 03/22/2015 0917   KETONESUR 15* 12/17/2014 1805   PROTEINUR Negative 03/22/2015 0917   PROTEINUR 100* 12/17/2014 1805   UROBILINOGEN 0.2 03/22/2015 0917   UROBILINOGEN 0.2 12/17/2014 1805   NITRITE Negative 03/22/2015 0917   NITRITE NEGATIVE 12/17/2014 1805   LEUKOCYTESUR Negative 03/22/2015 0917   LEUKOCYTESUR TRACE* 12/17/2014 1805   Results for NZINGA, FERRAN (MRN [id number]) as of 06/02/2015 11:47  Ref. Range 03/22/2015 09:24 04/07/2015 10:38 05/02/2015 12:17 05/09/2015 10:18 05/19/2015 08:50  Ferritin Latest Ref Range: 9-269 ng/ml 297 (H) 169 88 572 (H) 298 (H)   STUDIES: No results found.  ASSESSMENT: 82 y.o. [place] with iron deficiency anemia secondary to chronic blood loss, s/p oral iron supplementation with poor tolerance and inadequate response  (1) Feraheme started 07/24/2012, repeated whenever ferritin drops <100, mosr recent dose 12/16/2014  (2) transfusing PBBCs for Hb < 9.0 or symptoms  (3) chronic GIB extensively evaluated by GI with findings including AVM (cauterized), diverticular disease, hemorrhoids and gastric polyps  (4) on lifelong anticoagulation for AFib and mechanical aortic valve  (a) on warfarin until June 2016, when she was switched to Lovenox  (b) Lovenox discontinued September 2016 after rectus sheath hematoma developed  (c) warfarin resumed 12/27/2014 goal being an INR between 2.0 and 2.5   PLAN: [name] Is anemic again, with melenic stools--we're going to proceed to transfusion today.  She tells me a third Endoscopy Is Being Planned. At [facility] It May Become Inevitable to proceed to surgery, but she would certainly be high risk, we might miss the bleeding spots, there might be more than 1 bleeding spot, and generally the plan for now at least is to continue to temporize, by making sure she has plenty of iron, and trying to  keep the INR at a reasonable level.  She is planning to go to the beach late next week. I am going to bring her back March 9 just from labs. That means we would be able to transfuse if necessary and correct the INR before she leaves. I also gave her a written prescription for a CBC and INR, to be checked the 15th or 16th of this month, when she will be at the beach, in case we need to do something urgently. She will then return the following week for yet more labs.  She knows to call for any problems that may develop before her next visit with me, which will be in April.  Kyros Salzwedel C    06/02/2015

## 2015-06-02 NOTE — Telephone Encounter (Signed)
appt made and avs printed °

## 2015-06-02 NOTE — Patient Instructions (Signed)
Blood Transfusion  A blood transfusion is a procedure in which you receive donated blood through an IV tube. You may need a blood transfusion because of illness, surgery, or injury. The blood may come from a donor, or it may be your own blood that you donated previously. The blood given in a transfusion is made up of different types of cells. You may receive:  Red blood cells. These carry oxygen and replace lost blood.  Platelets. These control bleeding.  Plasma. Thishelps blood to clot. If you have hemophilia or another clotting disorder, you may also receive other types of blood products. LET YOUR HEALTH CARE PROVIDER KNOW ABOUT:  Any allergies you have.  All medicines you are taking, including vitamins, herbs, eye drops, creams, and over-the-counter medicines.  Previous problems you or members of your family have had with the use of anesthetics.  Any blood disorders you have.  Previous surgeries you have had.  Any medical conditions you may have.  Any previous reactions you have had during a blood transfusion.  RISKS AND COMPLICATIONS Generally, this is a safe procedure. However, problems may occur, including:  Having an allergic reaction to something in the donated blood.  Fever. This may be a reaction to the white blood cells in the transfused blood.  Iron overload. This can happen from having many transfusions.  Transfusion-related acute lung injury (TRALI). This is a rare reaction that causes lung damage. The cause is not known.TRALI can occur within hours of a transfusion or several days later.  Sudden (acute) or delayed hemolytic reactions. This happens if your blood does not match the cells in your transfusion. Your body's defense system (immune system) may try to attack the new cells. This complication is rare.  Infection. This is rare. BEFORE THE PROCEDURE  You may have a blood test to determine your blood type. This is necessary to know what kind of blood your  body will accept.  If you are going to have a planned surgery, you may donate your own blood. This may be done in case you need to have a transfusion.  If you have had an allergic reaction to a transfusion in the past, you may be given medicine to help prevent a reaction. Take this medicine only as directed by your health care provider.  You will have your temperature, blood pressure, and pulse monitored before the transfusion. PROCEDURE   An IV will be started in your hand or arm.  The bag of donated blood will be attached to your IV tube and given into your vein.  Your temperature, blood pressure, and pulse will be monitored regularly during the transfusion. This monitoring is done to detect early signs of a transfusion reaction.  If you have any signs or symptoms of a reaction, your transfusion will be stopped and you may be given medicine.  When the transfusion is over, your IV will be removed.  Pressure may be applied to the IV site for a few minutes.  A bandage (dressing) will be applied. The procedure may vary among health care providers and hospitals. AFTER THE PROCEDURE  Your blood pressure, temperature, and pulse will be monitored regularly.   This information is not intended to replace advice given to you by your health care provider. Make sure you discuss any questions you have with your health care provider.   Document Released: 03/15/2000 Document Revised: 04/08/2014 Document Reviewed: 01/26/2014 Elsevier Interactive Patient Education 2016 Elsevier Inc.  Ferumoxytol injection What is this medicine? FERUMOXYTOL   is an iron complex. Iron is used to make healthy red blood cells, which carry oxygen and nutrients throughout the body. This medicine is used to treat iron deficiency anemia in people with chronic kidney disease. This medicine may be used for other purposes; ask your health care provider or pharmacist if you have questions. What should I tell my health care  provider before I take this medicine? They need to know if you have any of these conditions: -anemia not caused by low iron levels -high levels of iron in the blood -magnetic resonance imaging (MRI) test scheduled -an unusual or allergic reaction to iron, other medicines, foods, dyes, or preservatives -pregnant or trying to get pregnant -breast-feeding How should I use this medicine? This medicine is for injection into a vein. It is given by a health care professional in a hospital or clinic setting. Talk to your pediatrician regarding the use of this medicine in children. Special care may be needed. Overdosage: If you think you have taken too much of this medicine contact a poison control center or emergency room at once. NOTE: This medicine is only for you. Do not share this medicine with others. What if I miss a dose? It is important not to miss your dose. Call your doctor or health care professional if you are unable to keep an appointment. What may interact with this medicine? This medicine may interact with the following medications: -other iron products This list may not describe all possible interactions. Give your health care provider a list of all the medicines, herbs, non-prescription drugs, or dietary supplements you use. Also tell them if you smoke, drink alcohol, or use illegal drugs. Some items may interact with your medicine. What should I watch for while using this medicine? Visit your doctor or healthcare professional regularly. Tell your doctor or healthcare professional if your symptoms do not start to get better or if they get worse. You may need blood work done while you are taking this medicine. You may need to follow a special diet. Talk to your doctor. Foods that contain iron include: whole grains/cereals, dried fruits, beans, or peas, leafy green vegetables, and organ meats (liver, kidney). What side effects may I notice from receiving this medicine? Side effects that  you should report to your doctor or health care professional as soon as possible: -allergic reactions like skin rash, itching or hives, swelling of the face, lips, or tongue -breathing problems -changes in blood pressure -feeling faint or lightheaded, falls -fever or chills -flushing, sweating, or hot feelings -swelling of the ankles or feet Side effects that usually do not require medical attention (Report these to your doctor or health care professional if they continue or are bothersome.): -diarrhea -headache -nausea, vomiting -stomach pain This list may not describe all possible side effects. Call your doctor for medical advice about side effects. You may report side effects to FDA at 1-800-FDA-1088. Where should I keep my medicine? This drug is given in a hospital or clinic and will not be stored at home. NOTE: This sheet is a summary. It may not cover all possible information. If you have questions about this medicine, talk to your doctor, pharmacist, or health care provider.    2016, Elsevier/Gold Standard. (2011-11-01 15:23:36)   

## 2015-06-02 NOTE — Progress Notes (Signed)
Pt to receive only 1 unit of prbc today per Dr. Jana Hakim. Hg 9.5.  1245- BP running very low. Pt is slightly symptomatic with lightheadedness post benadryl administration pre blood transfusion. Pt instructed to not get up by herself. Pt reports that she is on several bp medications and had recently started back on another bp med yesterday. Currently, according to med list, pt is on 6 bp medications at home. Attempted to call pt cardiologist office/ pcp to find out and clarify about pt bp meds. Unable to contact anyone. Left voicemail with nurse. Called and notified Val, Dr. Jana Hakim nurse about pt concern. Will follow up with doctor before pt is done with blood transfusion today.  1500- Pt tolerated 1unit prbc today. bp increased and pt denies any complaints at this time. Pt stable for discharge. Pt is still waiting a phone call for pt pcp to clarify bp medication. Dr. Jana Hakim nurse aware of pt concerns and will pass on to Dr. Jana Hakim. AVS printed for pt.

## 2015-06-05 LAB — TYPE AND SCREEN
ABO/RH(D): O POS
Antibody Screen: NEGATIVE
Unit division: 0
Unit division: 0

## 2015-06-08 ENCOUNTER — Other Ambulatory Visit (HOSPITAL_BASED_OUTPATIENT_CLINIC_OR_DEPARTMENT_OTHER): Payer: Medicare Other

## 2015-06-08 DIAGNOSIS — I4891 Unspecified atrial fibrillation: Secondary | ICD-10-CM

## 2015-06-08 DIAGNOSIS — Z954 Presence of other heart-valve replacement: Secondary | ICD-10-CM

## 2015-06-08 DIAGNOSIS — D500 Iron deficiency anemia secondary to blood loss (chronic): Secondary | ICD-10-CM

## 2015-06-08 DIAGNOSIS — K2961 Other gastritis with bleeding: Secondary | ICD-10-CM

## 2015-06-08 DIAGNOSIS — D689 Coagulation defect, unspecified: Secondary | ICD-10-CM

## 2015-06-08 DIAGNOSIS — Z7901 Long term (current) use of anticoagulants: Secondary | ICD-10-CM

## 2015-06-08 LAB — PROTIME-INR
INR: 1.8 — AB (ref 2.00–3.50)
Protime: 21.6 Seconds — ABNORMAL HIGH (ref 10.6–13.4)

## 2015-06-08 LAB — CBC WITH DIFFERENTIAL/PLATELET
BASO%: 0.3 % (ref 0.0–2.0)
Basophils Absolute: 0 10*3/uL (ref 0.0–0.1)
EOS%: 1 % (ref 0.0–7.0)
Eosinophils Absolute: 0 10*3/uL (ref 0.0–0.5)
HEMATOCRIT: 31.5 % — AB (ref 34.8–46.6)
HEMOGLOBIN: 10.2 g/dL — AB (ref 11.6–15.9)
LYMPH#: 0.7 10*3/uL — AB (ref 0.9–3.3)
LYMPH%: 17.2 % (ref 14.0–49.7)
MCH: 32.7 pg (ref 25.1–34.0)
MCHC: 32.4 g/dL (ref 31.5–36.0)
MCV: 101 fL (ref 79.5–101.0)
MONO#: 0.3 10*3/uL (ref 0.1–0.9)
MONO%: 6.3 % (ref 0.0–14.0)
NEUT%: 75.2 % (ref 38.4–76.8)
NEUTROS ABS: 3 10*3/uL (ref 1.5–6.5)
PLATELETS: 167 10*3/uL (ref 145–400)
RBC: 3.12 10*6/uL — ABNORMAL LOW (ref 3.70–5.45)
RDW: 17.7 % — ABNORMAL HIGH (ref 11.2–14.5)
WBC: 4 10*3/uL (ref 3.9–10.3)
nRBC: 0 % (ref 0–0)

## 2015-06-09 ENCOUNTER — Other Ambulatory Visit: Payer: Self-pay | Admitting: Oncology

## 2015-06-13 ENCOUNTER — Ambulatory Visit: Payer: Self-pay

## 2015-06-13 ENCOUNTER — Ambulatory Visit: Payer: Medicare Other

## 2015-06-13 ENCOUNTER — Other Ambulatory Visit: Payer: Self-pay

## 2015-06-13 ENCOUNTER — Telehealth: Payer: Self-pay | Admitting: Oncology

## 2015-06-13 ENCOUNTER — Ambulatory Visit (HOSPITAL_BASED_OUTPATIENT_CLINIC_OR_DEPARTMENT_OTHER): Payer: Medicare Other

## 2015-06-13 ENCOUNTER — Other Ambulatory Visit (HOSPITAL_BASED_OUTPATIENT_CLINIC_OR_DEPARTMENT_OTHER): Payer: Medicare Other

## 2015-06-13 ENCOUNTER — Ambulatory Visit (HOSPITAL_COMMUNITY)
Admission: RE | Admit: 2015-06-13 | Discharge: 2015-06-13 | Disposition: A | Discharge status: Home / Self Care | Payer: Medicare Other | Source: Ambulatory Visit | Attending: Oncology | Admitting: Oncology

## 2015-06-13 VITALS — BP 148/73 | HR 66 | Temp 97.8°F | Resp 16

## 2015-06-13 DIAGNOSIS — K922 Gastrointestinal hemorrhage, unspecified: Secondary | ICD-10-CM

## 2015-06-13 DIAGNOSIS — D500 Iron deficiency anemia secondary to blood loss (chronic): Secondary | ICD-10-CM | POA: Diagnosis present

## 2015-06-13 DIAGNOSIS — K2961 Other gastritis with bleeding: Secondary | ICD-10-CM

## 2015-06-13 DIAGNOSIS — I4891 Unspecified atrial fibrillation: Secondary | ICD-10-CM | POA: Diagnosis not present

## 2015-06-13 DIAGNOSIS — Z7901 Long term (current) use of anticoagulants: Secondary | ICD-10-CM

## 2015-06-13 DIAGNOSIS — D689 Coagulation defect, unspecified: Secondary | ICD-10-CM

## 2015-06-13 DIAGNOSIS — D6489 Other specified anemias: Secondary | ICD-10-CM

## 2015-06-13 DIAGNOSIS — Z95828 Presence of other vascular implants and grafts: Secondary | ICD-10-CM

## 2015-06-13 DIAGNOSIS — Z954 Presence of other heart-valve replacement: Secondary | ICD-10-CM

## 2015-06-13 LAB — CBC WITH DIFFERENTIAL/PLATELET
BASO%: 0.2 % (ref 0.0–2.0)
Basophils Absolute: 0 10*3/uL (ref 0.0–0.1)
EOS ABS: 0 10*3/uL (ref 0.0–0.5)
EOS%: 0.7 % (ref 0.0–7.0)
HEMATOCRIT: 27.4 % — AB (ref 34.8–46.6)
HGB: 8.7 g/dL — ABNORMAL LOW (ref 11.6–15.9)
LYMPH%: 12.9 % — ABNORMAL LOW (ref 14.0–49.7)
MCH: 32.7 pg (ref 25.1–34.0)
MCHC: 31.8 g/dL (ref 31.5–36.0)
MCV: 103 fL — ABNORMAL HIGH (ref 79.5–101.0)
MONO#: 0.3 10*3/uL (ref 0.1–0.9)
MONO%: 5.9 % (ref 0.0–14.0)
NEUT%: 80.3 % — ABNORMAL HIGH (ref 38.4–76.8)
NEUTROS ABS: 3.6 10*3/uL (ref 1.5–6.5)
NRBC: 0 % (ref 0–0)
PLATELETS: 169 10*3/uL (ref 145–400)
RBC: 2.66 10*6/uL — AB (ref 3.70–5.45)
RDW: 18 % — AB (ref 11.2–14.5)
WBC: 4.4 10*3/uL (ref 3.9–10.3)
lymph#: 0.6 10*3/uL — ABNORMAL LOW (ref 0.9–3.3)

## 2015-06-13 LAB — PROTIME-INR
INR: 2.2 (ref 2.00–3.50)
Protime: 26.4 Seconds — ABNORMAL HIGH (ref 10.6–13.4)

## 2015-06-13 LAB — PREPARE RBC (CROSSMATCH)

## 2015-06-13 MED ORDER — SODIUM CHLORIDE 0.9% FLUSH
10.0000 mL | INTRAVENOUS | Status: DC | PRN
  Administered 2015-06-13: 10 mL via INTRAVENOUS
  Filled 2015-06-13: qty 10

## 2015-06-13 MED ORDER — SODIUM CHLORIDE 0.9% FLUSH
10.0000 mL | INTRAVENOUS | Status: AC | PRN
  Administered 2015-06-13: 10 mL

## 2015-06-13 MED ORDER — DIPHENHYDRAMINE HCL 25 MG PO CAPS
25.0000 mg | ORAL_CAPSULE | Freq: Once | ORAL | Status: AC
  Administered 2015-06-13: 25 mg via ORAL
  Filled 2015-06-13: qty 1

## 2015-06-13 MED ORDER — HEPARIN SOD (PORK) LOCK FLUSH 100 UNIT/ML IV SOLN
500.0000 [IU] | Freq: Every day | INTRAVENOUS | Status: AC | PRN
  Administered 2015-06-13: 500 [IU]
  Filled 2015-06-13: qty 5

## 2015-06-13 MED ORDER — ACETAMINOPHEN 325 MG PO TABS
650.0000 mg | ORAL_TABLET | Freq: Once | ORAL | Status: AC
  Administered 2015-06-13: 650 mg via ORAL
  Filled 2015-06-13: qty 2

## 2015-06-13 MED ORDER — SODIUM CHLORIDE 0.9 % IV SOLN
250.0000 mL | Freq: Once | INTRAVENOUS | Status: AC
  Administered 2015-06-13: 250 mL via INTRAVENOUS

## 2015-06-13 NOTE — Progress Notes (Signed)
Patient here for a cbc hgb- 8.7  Patient requesting 2 units of blood.  She is going to the beach for the weekend.  Ok'd per Gentry Fitz, NP  Patient to have repeat lab on 06/20/15.

## 2015-06-13 NOTE — Progress Notes (Signed)
Patient's INR today was 2.2.   Patient is presently taking '5mg'$ , '5mg'$ , 2.'5mg'$ -  2 days of '5mg'$  then one day of 2.'5mg'$   Patient to continue on same dosing.  Repeat lab next week 06/20/15

## 2015-06-13 NOTE — Progress Notes (Signed)
Diagnosis Association: Anemia due to other cause (285.8)  MD: Dr.Magrinat  Procedure: Pt received 2 units of PRBcs per porta cath  Pt tolerated infusion well. And porta cath was flushed per protocol.  Post procedure pt was alert, oriented and ambulatory at discharge

## 2015-06-13 NOTE — Progress Notes (Signed)
Patient's HGB is 8.7  Patient is requesting 2 units of blood today.  She is going to the beach this weekend however will be back on Monday.  POF sent for repeat INR and CBC on Tuesday.

## 2015-06-13 NOTE — Telephone Encounter (Signed)
Per 3/14 pof patient to return to lab 3/21. Cancelled lab for 3/23 and added lab for 3/21. Left message informing patient and mailed new schedule.

## 2015-06-13 NOTE — Patient Instructions (Signed)

## 2015-06-14 ENCOUNTER — Other Ambulatory Visit: Payer: Medicare Other

## 2015-06-14 LAB — TYPE AND SCREEN
ABO/RH(D): O POS
Antibody Screen: NEGATIVE
Unit division: 0
Unit division: 0

## 2015-06-20 ENCOUNTER — Other Ambulatory Visit: Payer: Medicare Other

## 2015-06-21 ENCOUNTER — Telehealth: Payer: Self-pay | Admitting: Oncology

## 2015-06-21 ENCOUNTER — Other Ambulatory Visit (HOSPITAL_BASED_OUTPATIENT_CLINIC_OR_DEPARTMENT_OTHER): Payer: Medicare Other

## 2015-06-21 ENCOUNTER — Ambulatory Visit: Payer: Self-pay

## 2015-06-21 DIAGNOSIS — Z7901 Long term (current) use of anticoagulants: Secondary | ICD-10-CM

## 2015-06-21 DIAGNOSIS — D689 Coagulation defect, unspecified: Secondary | ICD-10-CM

## 2015-06-21 DIAGNOSIS — K2961 Other gastritis with bleeding: Secondary | ICD-10-CM

## 2015-06-21 DIAGNOSIS — D500 Iron deficiency anemia secondary to blood loss (chronic): Secondary | ICD-10-CM

## 2015-06-21 DIAGNOSIS — Z954 Presence of other heart-valve replacement: Secondary | ICD-10-CM

## 2015-06-21 DIAGNOSIS — K922 Gastrointestinal hemorrhage, unspecified: Secondary | ICD-10-CM

## 2015-06-21 DIAGNOSIS — I4891 Unspecified atrial fibrillation: Secondary | ICD-10-CM | POA: Diagnosis not present

## 2015-06-21 LAB — CBC WITH DIFFERENTIAL/PLATELET
BASO%: 0.6 % (ref 0.0–2.0)
Basophils Absolute: 0 10*3/uL (ref 0.0–0.1)
EOS%: 1.2 % (ref 0.0–7.0)
Eosinophils Absolute: 0 10*3/uL (ref 0.0–0.5)
HEMATOCRIT: 34.6 % — AB (ref 34.8–46.6)
HEMOGLOBIN: 11.1 g/dL — AB (ref 11.6–15.9)
LYMPH#: 0.5 10*3/uL — AB (ref 0.9–3.3)
LYMPH%: 13.9 % — ABNORMAL LOW (ref 14.0–49.7)
MCH: 31.4 pg (ref 25.1–34.0)
MCHC: 31.9 g/dL (ref 31.5–36.0)
MCV: 98.2 fL (ref 79.5–101.0)
MONO#: 0.3 10*3/uL (ref 0.1–0.9)
MONO%: 8.9 % (ref 0.0–14.0)
NEUT%: 75.4 % (ref 38.4–76.8)
NEUTROS ABS: 3 10*3/uL (ref 1.5–6.5)
Platelets: 182 10*3/uL (ref 145–400)
RBC: 3.52 10*6/uL — AB (ref 3.70–5.45)
RDW: 19.8 % — AB (ref 11.2–14.5)
WBC: 3.9 10*3/uL (ref 3.9–10.3)

## 2015-06-21 LAB — FERRITIN: FERRITIN: 57 ng/mL (ref 9–269)

## 2015-06-21 LAB — PROTIME-INR
INR: 1.7 — ABNORMAL LOW (ref 2.00–3.50)
Protime: 20.4 Seconds — ABNORMAL HIGH (ref 10.6–13.4)

## 2015-06-21 NOTE — Telephone Encounter (Signed)
Spoke with patient to confirm lab only appt 3/29 at 1030 am

## 2015-06-21 NOTE — Progress Notes (Signed)
INR- 1.7 Current coumadin dose- 5 mg, 5 mg, 2.5 mg Per Dr. Jana Hakim patient should continue same dosing and repeat labs in one week.   Patient's Hgb is 11.1 today and patient states she has had no blood in her stool for 2 days.  Patient is schedule for an EGD on 07/05/15 which she hopes to cancel if all goes well and she continues not to have blood in her stool.

## 2015-06-22 ENCOUNTER — Other Ambulatory Visit: Payer: Medicare Other

## 2015-06-28 ENCOUNTER — Other Ambulatory Visit (HOSPITAL_BASED_OUTPATIENT_CLINIC_OR_DEPARTMENT_OTHER): Payer: Medicare Other

## 2015-06-28 DIAGNOSIS — D689 Coagulation defect, unspecified: Secondary | ICD-10-CM

## 2015-06-28 DIAGNOSIS — K2961 Other gastritis with bleeding: Secondary | ICD-10-CM

## 2015-06-28 DIAGNOSIS — Z954 Presence of other heart-valve replacement: Secondary | ICD-10-CM

## 2015-06-28 DIAGNOSIS — D500 Iron deficiency anemia secondary to blood loss (chronic): Secondary | ICD-10-CM

## 2015-06-28 DIAGNOSIS — I4891 Unspecified atrial fibrillation: Secondary | ICD-10-CM | POA: Diagnosis not present

## 2015-06-28 DIAGNOSIS — Z7901 Long term (current) use of anticoagulants: Secondary | ICD-10-CM

## 2015-06-28 LAB — CBC WITH DIFFERENTIAL/PLATELET
BASO%: 0.6 % (ref 0.0–2.0)
Basophils Absolute: 0 10*3/uL (ref 0.0–0.1)
EOS ABS: 0.1 10*3/uL (ref 0.0–0.5)
EOS%: 1.3 % (ref 0.0–7.0)
HEMATOCRIT: 31.4 % — AB (ref 34.8–46.6)
HEMOGLOBIN: 10.1 g/dL — AB (ref 11.6–15.9)
LYMPH#: 0.7 10*3/uL — AB (ref 0.9–3.3)
LYMPH%: 14.9 % (ref 14.0–49.7)
MCH: 31.5 pg (ref 25.1–34.0)
MCHC: 32.2 g/dL (ref 31.5–36.0)
MCV: 97.8 fL (ref 79.5–101.0)
MONO#: 0.4 10*3/uL (ref 0.1–0.9)
MONO%: 8.6 % (ref 0.0–14.0)
NEUT%: 74.6 % (ref 38.4–76.8)
NEUTROS ABS: 3.5 10*3/uL (ref 1.5–6.5)
NRBC: 0 % (ref 0–0)
PLATELETS: 187 10*3/uL (ref 145–400)
RBC: 3.21 10*6/uL — ABNORMAL LOW (ref 3.70–5.45)
RDW: 17.4 % — AB (ref 11.2–14.5)
WBC: 4.8 10*3/uL (ref 3.9–10.3)

## 2015-06-28 LAB — PROTIME-INR
INR: 2.1 (ref 2.00–3.50)
Protime: 25.2 Seconds — ABNORMAL HIGH (ref 10.6–13.4)

## 2015-06-29 ENCOUNTER — Other Ambulatory Visit: Payer: Self-pay | Admitting: *Deleted

## 2015-06-29 ENCOUNTER — Telehealth: Payer: Self-pay | Admitting: *Deleted

## 2015-06-29 DIAGNOSIS — I119 Hypertensive heart disease without heart failure: Secondary | ICD-10-CM

## 2015-06-29 DIAGNOSIS — K921 Melena: Secondary | ICD-10-CM

## 2015-06-29 DIAGNOSIS — D500 Iron deficiency anemia secondary to blood loss (chronic): Secondary | ICD-10-CM

## 2015-06-29 NOTE — Telephone Encounter (Signed)
Per MD review of lab   No change in coumadin dose.  Per noted drop in heme with some black stools reported, MD requested additional lab to be done this Friday 31.  This RN attempted to contact pt and obtained identified VM.  Message left.  Pt returned call and per call this RN scheduled appointment for 06/30/2015.

## 2015-06-30 ENCOUNTER — Other Ambulatory Visit (HOSPITAL_BASED_OUTPATIENT_CLINIC_OR_DEPARTMENT_OTHER): Payer: Medicare Other

## 2015-06-30 ENCOUNTER — Encounter: Payer: Self-pay | Admitting: Cardiovascular Disease

## 2015-06-30 ENCOUNTER — Telehealth: Payer: Self-pay | Admitting: *Deleted

## 2015-06-30 ENCOUNTER — Other Ambulatory Visit: Payer: Self-pay | Admitting: *Deleted

## 2015-06-30 ENCOUNTER — Other Ambulatory Visit: Payer: Medicare Other

## 2015-06-30 ENCOUNTER — Ambulatory Visit (INDEPENDENT_AMBULATORY_CARE_PROVIDER_SITE_OTHER): Payer: Medicare Other | Admitting: Cardiovascular Disease

## 2015-06-30 VITALS — BP 130/82 | HR 75 | Ht 60.0 in | Wt 124.8 lb

## 2015-06-30 DIAGNOSIS — I4891 Unspecified atrial fibrillation: Secondary | ICD-10-CM | POA: Diagnosis present

## 2015-06-30 DIAGNOSIS — D500 Iron deficiency anemia secondary to blood loss (chronic): Secondary | ICD-10-CM

## 2015-06-30 DIAGNOSIS — Z954 Presence of other heart-valve replacement: Secondary | ICD-10-CM

## 2015-06-30 DIAGNOSIS — K921 Melena: Secondary | ICD-10-CM

## 2015-06-30 DIAGNOSIS — I119 Hypertensive heart disease without heart failure: Secondary | ICD-10-CM

## 2015-06-30 DIAGNOSIS — I482 Chronic atrial fibrillation, unspecified: Secondary | ICD-10-CM

## 2015-06-30 DIAGNOSIS — Z7901 Long term (current) use of anticoagulants: Secondary | ICD-10-CM

## 2015-06-30 DIAGNOSIS — D509 Iron deficiency anemia, unspecified: Secondary | ICD-10-CM | POA: Diagnosis not present

## 2015-06-30 DIAGNOSIS — I6330 Cerebral infarction due to thrombosis of unspecified cerebral artery: Secondary | ICD-10-CM

## 2015-06-30 DIAGNOSIS — I10 Essential (primary) hypertension: Secondary | ICD-10-CM

## 2015-06-30 DIAGNOSIS — Z952 Presence of prosthetic heart valve: Secondary | ICD-10-CM

## 2015-06-30 LAB — CBC WITH DIFFERENTIAL/PLATELET
BASO%: 0.2 % (ref 0.0–2.0)
BASOS ABS: 0 10*3/uL (ref 0.0–0.1)
EOS ABS: 0.1 10*3/uL (ref 0.0–0.5)
EOS%: 1.8 % (ref 0.0–7.0)
HCT: 30.3 % — ABNORMAL LOW (ref 34.8–46.6)
HGB: 9.8 g/dL — ABNORMAL LOW (ref 11.6–15.9)
LYMPH%: 12.6 % — AB (ref 14.0–49.7)
MCH: 32 pg (ref 25.1–34.0)
MCHC: 32.3 g/dL (ref 31.5–36.0)
MCV: 99 fL (ref 79.5–101.0)
MONO#: 0.3 10*3/uL (ref 0.1–0.9)
MONO%: 6.8 % (ref 0.0–14.0)
NEUT#: 3.4 10*3/uL (ref 1.5–6.5)
NEUT%: 78.6 % — ABNORMAL HIGH (ref 38.4–76.8)
PLATELETS: 163 10*3/uL (ref 145–400)
RBC: 3.06 10*6/uL — AB (ref 3.70–5.45)
RDW: 17.6 % — ABNORMAL HIGH (ref 11.2–14.5)
WBC: 4.4 10*3/uL (ref 3.9–10.3)
lymph#: 0.6 10*3/uL — ABNORMAL LOW (ref 0.9–3.3)

## 2015-06-30 MED ORDER — LOSARTAN POTASSIUM 100 MG PO TABS: 100.0000 mg | ORAL_TABLET | Freq: Every evening | ORAL | Status: DC

## 2015-06-30 MED ORDER — HYDROCHLOROTHIAZIDE 25 MG PO TABS: 25.0000 mg | ORAL_TABLET | Freq: Every evening | ORAL | Status: DC

## 2015-06-30 NOTE — Telephone Encounter (Signed)
Per desk RN I have scheduled appt for iron treatment on 4/4. Desk RN to call the patient

## 2015-06-30 NOTE — Telephone Encounter (Signed)
This RN spoke with pt per concern for mild decrease in heme and onset of dark stools.  Noted heme today of 9.8.  Pt is having 1 BM daily ( had one this am )- which is " dark as tar "  Emilya denies any coffee grounds or geletionous consistancy in stools.  Overall she does not feel SOB or weak.  This RN discussed blood transfusion options.  She is planning a trip to the beach for Easter and states " I want to feel as strong as I can and would prefer to wait to closer to then for a transfusion "  Plan at present is Lilas will return on Tuesday 4/4 for lab for review for transfusion. She will monitor stools and other symptoms associated with low heme.  Daughter [name] was present per discussion.

## 2015-06-30 NOTE — Progress Notes (Signed)
d   Cardiology Office Note   Date:  07/02/2015   ID:  Deborah [name], DOB Oct 29, 1926, MRN [id number]  PCP:  Mathews Argyle, MD  Cardiologist:   Sharol Harness, MD   Chief Complaint  Patient presents with  . New Evaluation    Chronic AFIB  3 month--prior [facility] pt  pt c/o mild SOB--hemoglobin has been low, no other Sx.     History of Present Illness: Deborah [name] is a 82 y.o. female with hypertension, hyperlipidemia, chronic atrial fibrillation, stroke, hypothyroidism, prior aortic valve replacement, and NSVT who presents for follow-up.  Deborah [name] as previously a patient of Dr. Mare Ferrari.Deborah [name] previously developed a retroperitoneal hematoma on warfarin in September 2006 and was temporarily switched to Lovenox. However she was transitioned back to warfarin. Her anticoagulation is managed by her oncologist, Dr. Doreen Salvage.  Her INR goal is to use a 2.5. She last saw Dr. Mare Ferrari in January and reported some melena. She continued following up with her oncologist for management of her anemia and required infusion of IV iron. She had a Lexiscan Cardiolite in January that was negative for ischemia. Her echo in January showed mild to moderate aortic stenosis across her mechanical aortic valve.    Deborah [name] continues to have GI bleeding.  She endorses melena and cramping in her right lower quadrant.  She feels like her abdomen is bloated.  She has been getting transfusions with 1-2 units of pRBCs every 10 days to 2 weeks.  She has an appointment scheduled with [facility] for consideration of EGD and colonoscopy.   Deborah [name] has been awakening with her head pounding between 1 and 5 am.  She checks her blood pressure and has found that it is as high as 956 systolic.  After getting up and walking around it decreases without intervention.    Past Medical History  Diagnosis Date  . Hypothyroidism   . Stomach problems     seeing Dr Watt Climes  . Hypertension   . GERD  (gastroesophageal reflux disease)   . Anemia   . Arrhythmia     Atrial Fibrillation  . Breast cancer ([place])     "right; S/P lumpectomy, chemo, XRT"   . Complication of anesthesia     "once I'm awakened, I don't sleep til the following night"  . High cholesterol   . Heart murmur   . History of blood transfusion 1996; ~ 2013    "related to valve replacement; GI bleeding"   . Chronic lower GI bleeding   . Arthritis     "fingers, toes, hips; qwhere" (05/19/2013)  . Gout   . Pneumonia 07/2011  . Rectus sheath hematoma 12/19/14     held anticoagulation for 1 week, transfused  . TIA (transient ischemic attack) 12/24/14    symptoms resolved  . History of nuclear stress test     Myoview 1/17: EF 59%, normal perfusion, low risk  . History of echocardiogram     Echo 1/17: mild LVH, EF 50-55%, no RWMA, mild AI, mechanical AVR with mean 21 mmHg/peak 42 mmHg (stable since 2012), mod MR, mod to severe BAE, PASP 36 mmHg    Past Surgical History  Procedure Laterality Date  . Arteriogram  01/12    NO RAS   . Cardioversion      X 2  . Cardiac catheterization    . Cataract extraction w/ intraocular lens  implant, bilateral Bilateral     bilateral cataract with lens implants  . Cholecystectomy    .  Abdominal hysterectomy    . Appendectomy    . Colonoscopy with propofol N/A 11/24/2012    Procedure: COLONOSCOPY WITH PROPOFOL;  Surgeon: Jeryl Columbia, MD;  Location: [facility] END[facility];  Service: Endoscopy;  Laterality: N/A;  . Breast biopsy Right   . Breast lumpectomy Right   . Aortic valve replacement  1996    [employer]     Current Outpatient Prescriptions  Medication Sig Dispense Refill  . acetaminophen (TYLENOL) 500 MG tablet Take 1,000 mg by mouth every 6 (six) hours as needed for headache.    . allopurinol (ZYLOPRIM) 300 MG tablet Take 300 mg by mouth daily.    Marland Kitchen amLODipine (NORVASC) 5 MG tablet Take 5 mg by mouth daily.     . calcium-vitamin D 500 MG tablet Take 1 tablet by mouth 2 (two) times  daily.      . celecoxib (CELEBREX) 200 MG capsule Take 200 mg by mouth. Every 2-3 days  4  . cloNIDine (CATAPRES) 0.1 MG tablet Take 0.2 mg by mouth 2 (two) times daily.    Marland Kitchen dicyclomine (BENTYL) 10 MG capsule Take 10 mg by mouth daily as needed for spasms.     . digoxin (LANOXIN) 0.125 MG tablet Take 0.125 mg by mouth daily.    . fexofenadine (ALLEGRA ALLERGY) 180 MG tablet Take 180 mg by mouth daily as needed for allergies.     . hydrochlorothiazide (HYDRODIURIL) 25 MG tablet Take 1 tablet (25 mg total) by mouth every evening. 90 tablet 3  . levothyroxine (SYNTHROID, LEVOTHROID) 88 MCG tablet Take 88 mcg by mouth daily before breakfast.    . lidocaine-prilocaine (EMLA) cream Apply 1 application topically as needed (prior to infusion at porta cath site). 30 g 1  . losartan (COZAAR) 100 MG tablet Take 1 tablet (100 mg total) by mouth every evening. 90 tablet 3  . multivitamin (THERAGRAN) per tablet Take 1 tablet by mouth daily.     Marland Kitchen NITROSTAT 0.4 MG SL tablet Place 0.4 mg under the tongue every 5 (five) minutes as needed for chest pain (x 3 doses).   1  . Omega-3 Fatty Acids (FISH OIL) 1000 MG CAPS Take 1,000 mg by mouth 2 (two) times daily.     . simvastatin (ZOCOR) 20 MG tablet TAKE ONE-HALF TABLET DAILY 30 tablet 3  . spironolactone (ALDACTONE) 25 MG tablet Take 25 mg by mouth 2 (two) times daily.  5  . warfarin (COUMADIN) 5 MG tablet Take 1 tablet (5 mg total) by mouth daily. 30 tablet 3  . zolpidem (AMBIEN CR) 12.5 MG CR tablet Take 12.5 mg by mouth at bedtime as needed for sleep.     No current facility-administered medications for this visit.    Allergies:   Amiodarone; Alendronate sodium; Macrodantin; Meclizine; Norvasc; and Tussionex pennkinetic er    Social History:  The patient  reports that she quit smoking about 27 years ago. Her smoking use included Cigarettes. She has a 20 pack-year smoking history. She has never used smokeless tobacco. She reports that she drinks about 4.8 oz  of alcohol per week. She reports that she does not use illicit drugs.   Family History:  The patient's family history includes Breast cancer in her daughter; COPD in her brother; Colon cancer in her sister; Coronary artery disease in her father; Dementia in her mother; Liver cancer in her sister; Lung cancer in her brother.    ROS:  Please see the history of present illness.   Otherwise, review  of systems are positive for none.   All other systems are reviewed and negative.    PHYSICAL EXAM: VS:  BP 130/82 mmHg  Pulse 75  Ht 5' (1.524 m)  Wt 56.609 kg (124 lb 12.8 oz)  BMI 24.37 kg/m2 , BMI Body mass index is 24.37 kg/(m^2). GENERAL:  Well appearing HEENT:  Pupils equal round and reactive, fundi not visualized, oral mucosa unremarkable NECK:  No jugular venous distention, waveform within normal limits, carotid upstroke brisk and symmetric, no bruits, no thyromegaly LYMPHATICS:  No cervical adenopathy LUNGS:  Clear to auscultation bilaterally HEART:  RRR.  PMI not displaced or sustained,S1 within normal limits, mechanical S2, no S3, no S4, no clicks, no rubs, II/VI systolic murmur. ABD:  Flat, positive bowel sounds normal in frequency in pitch, no bruits, no rebound, no guarding, no midline pulsatile mass, no hepatomegaly, no splenomegaly EXT:  2 plus pulses throughout, no edema, no cyanosis no clubbing SKIN:  No rashes no nodules NEURO:  Cranial nerves II through XII grossly intact, motor grossly intact throughout PSYCH:  Cognitively intact, oriented to person place and time    EKG:  EKG is ordered today. The ekg ordered today demonstrates atrial bigeminy.  Rate 75 bpm.  Prior septal infarct.  Lateral ST-T changes consistent with ischemia.    Recent Labs: 12/25/2014: B Natriuretic Peptide 523.8* 06/02/2015: ALT 20; BUN 65.4*; Creatinine 1.5*; Potassium 5.3*; Sodium 132* 06/30/2015: HGB 9.8*; Platelets 163    Lipid Panel    Component Value Date/Time   CHOL 138 12/26/2014 0356    TRIG 94 12/26/2014 0356   HDL 55 12/26/2014 0356   CHOLHDL 2.5 12/26/2014 0356   VLDL 19 12/26/2014 0356   LDLCALC 64 12/26/2014 0356   LDLDIRECT 93.7 04/10/2011 1000      Wt Readings from Last 3 Encounters:  06/30/15 56.609 kg (124 lb 12.8 oz)  06/02/15 56.473 kg (124 lb 8 oz)  04/06/15 53.434 kg (117 lb 12.8 oz)      ASSESSMENT AND PLAN:  # Hypertension: Deborah [name] blood pressure is well-controlled today.  However, she reports that her BP is high at night.  Currently she takes losartan and HCTZ as split doses.  We will switch that to Losartan 100 qhs and HCTZ 25 qhs.  Continue amlodipine 5 mg daily, clonidine 0.'1mg'$  bid, and spironolactone 25 mg bid.  # Atrial fibrillation: She is currently in atrial bigeminy.  She is not on any nodal agents.  Continue anticoagulation with warfarin, INR goal 2-2.5.  # s/p mechanical AVR: Stable with moderately increased gradients.  INR goal 2-2.5 due to GI bleeding requiring transfusions.     Current medicines are reviewed at length with the patient today.  The patient does not have concerns regarding medicines.  The following changes have been made:  Change losartan and HCTZ to qhs.  Labs/ tests ordered today include:   Orders Placed This Encounter  Procedures  . EKG 12-Lead     Disposition:   FU with Seleen Walter C. Oval Linsey, MD, [facility] in 2 months.   This note was written with the assistance of speech recognition software.  Please excuse any transcriptional errors.  Signed, Aryan Bello C. Oval Linsey, MD, [facility]  07/02/2015 9:00 AM    [place]

## 2015-06-30 NOTE — Patient Instructions (Addendum)
Medication Instructions:  START TAKING YOUR LOSARTAN 100 mg AND HYDROCHLOROTHIAZIDE 25 mg IN THE EVENING NONE IN THE MORNING  Labwork: NONE  Testing/Procedures: NONE  Follow-Up: Your physician recommends that you schedule a follow-up appointment in: 2 MONTH OV  If you need a refill on your cardiac medications before your next appointment, please call your pharmacy.

## 2015-07-02 ENCOUNTER — Encounter: Payer: Self-pay | Admitting: Cardiovascular Disease

## 2015-07-03 ENCOUNTER — Other Ambulatory Visit: Payer: Self-pay | Admitting: *Deleted

## 2015-07-03 ENCOUNTER — Ambulatory Visit (HOSPITAL_COMMUNITY)
Admission: RE | Admit: 2015-07-03 | Discharge: 2015-07-03 | Disposition: A | Discharge status: Home / Self Care | Payer: Medicare Other | Source: Ambulatory Visit | Attending: Oncology | Admitting: Oncology

## 2015-07-03 ENCOUNTER — Other Ambulatory Visit (HOSPITAL_BASED_OUTPATIENT_CLINIC_OR_DEPARTMENT_OTHER): Payer: Medicare Other

## 2015-07-03 VITALS — BP 131/53 | HR 82 | Temp 98.4°F | Resp 18

## 2015-07-03 DIAGNOSIS — D649 Anemia, unspecified: Secondary | ICD-10-CM

## 2015-07-03 DIAGNOSIS — D500 Iron deficiency anemia secondary to blood loss (chronic): Secondary | ICD-10-CM

## 2015-07-03 DIAGNOSIS — D509 Iron deficiency anemia, unspecified: Secondary | ICD-10-CM

## 2015-07-03 LAB — CBC WITH DIFFERENTIAL/PLATELET
BASO%: 0.2 % (ref 0.0–2.0)
BASOS ABS: 0 10*3/uL (ref 0.0–0.1)
EOS ABS: 0 10*3/uL (ref 0.0–0.5)
EOS%: 0.2 % (ref 0.0–7.0)
HCT: 25.6 % — ABNORMAL LOW (ref 34.8–46.6)
HGB: 8.5 g/dL — ABNORMAL LOW (ref 11.6–15.9)
LYMPH%: 6.4 % — AB (ref 14.0–49.7)
MCH: 32.4 pg (ref 25.1–34.0)
MCHC: 33 g/dL (ref 31.5–36.0)
MCV: 98.3 fL (ref 79.5–101.0)
MONO#: 0.4 10*3/uL (ref 0.1–0.9)
MONO%: 7.2 % (ref 0.0–14.0)
NEUT%: 86 % — ABNORMAL HIGH (ref 38.4–76.8)
NEUTROS ABS: 4.8 10*3/uL (ref 1.5–6.5)
PLATELETS: 153 10*3/uL (ref 145–400)
RBC: 2.61 10*6/uL — AB (ref 3.70–5.45)
RDW: 19.1 % — ABNORMAL HIGH (ref 11.2–14.5)
WBC: 5.6 10*3/uL (ref 3.9–10.3)
lymph#: 0.4 10*3/uL — ABNORMAL LOW (ref 0.9–3.3)

## 2015-07-03 LAB — PREPARE RBC (CROSSMATCH)

## 2015-07-03 MED ORDER — DIPHENHYDRAMINE HCL 25 MG PO CAPS
25.0000 mg | ORAL_CAPSULE | Freq: Once | ORAL | Status: AC
  Administered 2015-07-03: 25 mg via ORAL
  Filled 2015-07-03: qty 1

## 2015-07-03 MED ORDER — SODIUM CHLORIDE 0.9% FLUSH
10.0000 mL | INTRAVENOUS | Status: AC | PRN
  Administered 2015-07-03: 10 mL

## 2015-07-03 MED ORDER — HEPARIN SOD (PORK) LOCK FLUSH 100 UNIT/ML IV SOLN
250.0000 [IU] | INTRAVENOUS | Status: AC | PRN
  Administered 2015-07-03: 500 [IU]
  Filled 2015-07-03: qty 5

## 2015-07-03 MED ORDER — ACETAMINOPHEN 325 MG PO TABS
650.0000 mg | ORAL_TABLET | Freq: Once | ORAL | Status: AC
  Administered 2015-07-03: 650 mg via ORAL
  Filled 2015-07-03: qty 2

## 2015-07-03 MED ORDER — SODIUM CHLORIDE 0.9% FLUSH: 3.0000 mL | INTRAVENOUS | Status: DC | PRN

## 2015-07-03 MED ORDER — SODIUM CHLORIDE 0.9 % IV SOLN
250.0000 mL | Freq: Once | INTRAVENOUS | Status: AC
  Administered 2015-07-03: 250 mL via INTRAVENOUS

## 2015-07-03 MED ORDER — HEPARIN SOD (PORK) LOCK FLUSH 100 UNIT/ML IV SOLN: 500.0000 [IU] | Freq: Every day | INTRAVENOUS | Status: DC | PRN

## 2015-07-03 NOTE — Discharge Instructions (Signed)
Blood Transfusion  A blood transfusion is a procedure in which you receive donated blood through an IV tube. You may need a blood transfusion because of illness, surgery, or injury. The blood may come from a donor, or it may be your own blood that you donated previously. The blood given in a transfusion is made up of different types of cells. You may receive:  Red blood cells. These carry oxygen and replace lost blood.  Platelets. These control bleeding.  Plasma. Thishelps blood to clot. If you have hemophilia or another clotting disorder, you may also receive other types of blood products. LET [facility] CARE PROVIDER KNOW ABOUT:  Any allergies you have.  All medicines you are taking, including vitamins, herbs, eye drops, creams, and over-the-counter medicines.  Previous problems you or members of your family have had with the use of anesthetics.  Any blood disorders you have.  Previous surgeries you have had.  Any medical conditions you may have.  Any previous reactions you have had during a blood transfusion.  RISKS AND COMPLICATIONS Generally, this is a safe procedure. However, problems may occur, including:  Having an allergic reaction to something in the donated blood.  Fever. This may be a reaction to the white blood cells in the transfused blood.  Iron overload. This can happen from having many transfusions.  Transfusion-related acute lung injury (TRALI). This is a rare reaction that causes lung damage. The cause is not known.TRALI can occur within hours of a transfusion or several days later.  Sudden (acute) or delayed hemolytic reactions. This happens if your blood does not match the cells in your transfusion. Your body's defense system (immune system) may try to attack the new cells. This complication is rare.  Infection. This is rare. BEFORE THE PROCEDURE  You may have a blood test to determine your blood type. This is necessary to know what kind of blood your  body will accept.  If you are going to have a planned surgery, you may donate your own blood. This may be done in case you need to have a transfusion.  If you have had an allergic reaction to a transfusion in the past, you may be given medicine to help prevent a reaction. Take this medicine only as directed by your health care provider.  You will have your temperature, blood pressure, and pulse monitored before the transfusion. PROCEDURE   An IV will be started in your hand or arm.  The bag of donated blood will be attached to your IV tube and given into your vein.  Your temperature, blood pressure, and pulse will be monitored regularly during the transfusion. This monitoring is done to detect early signs of a transfusion reaction.  If you have any signs or symptoms of a reaction, your transfusion will be stopped and you may be given medicine.  When the transfusion is over, your IV will be removed.  Pressure may be applied to the IV site for a few minutes.  A bandage (dressing) will be applied. The procedure may vary among health care providers and hospitals. AFTER THE PROCEDURE  Your blood pressure, temperature, and pulse will be monitored regularly.   This information is not intended to replace advice given to you by your health care provider. Make sure you discuss any questions you have with your health care provider.   Document Released: 03/15/2000 Document Revised: 04/08/2014 Document Reviewed: 01/26/2014 Elsevier Interactive Patient Education 2016 [place]. Blood Transfusion  A blood transfusion is a  procedure in which you receive donated blood through an IV tube. You may need a blood transfusion because of illness, surgery, or injury. The blood may come from a donor, or it may be your own blood that you donated previously. The blood given in a transfusion is made up of different types of cells. You may receive:  Red blood cells. These carry oxygen and replace lost  blood.  Platelets. These control bleeding.  Plasma. Thishelps blood to clot. If you have hemophilia or another clotting disorder, you may also receive other types of blood products. LET [facility] CARE PROVIDER KNOW ABOUT:  Any allergies you have.  All medicines you are taking, including vitamins, herbs, eye drops, creams, and over-the-counter medicines.  Previous problems you or members of your family have had with the use of anesthetics.  Any blood disorders you have.  Previous surgeries you have had.  Any medical conditions you may have.  Any previous reactions you have had during a blood transfusion.  RISKS AND COMPLICATIONS Generally, this is a safe procedure. However, problems may occur, including:  Having an allergic reaction to something in the donated blood.  Fever. This may be a reaction to the white blood cells in the transfused blood.  Iron overload. This can happen from having many transfusions.  Transfusion-related acute lung injury (TRALI). This is a rare reaction that causes lung damage. The cause is not known.TRALI can occur within hours of a transfusion or several days later.  Sudden (acute) or delayed hemolytic reactions. This happens if your blood does not match the cells in your transfusion. Your body's defense system (immune system) may try to attack the new cells. This complication is rare.  Infection. This is rare. BEFORE THE PROCEDURE  You may have a blood test to determine your blood type. This is necessary to know what kind of blood your body will accept.  If you are going to have a planned surgery, you may donate your own blood. This may be done in case you need to have a transfusion.  If you have had an allergic reaction to a transfusion in the past, you may be given medicine to help prevent a reaction. Take this medicine only as directed by your health care provider.  You will have your temperature, blood pressure, and pulse monitored  before the transfusion. PROCEDURE   An IV will be started in your hand or arm.  The bag of donated blood will be attached to your IV tube and given into your vein.  Your temperature, blood pressure, and pulse will be monitored regularly during the transfusion. This monitoring is done to detect early signs of a transfusion reaction.  If you have any signs or symptoms of a reaction, your transfusion will be stopped and you may be given medicine.  When the transfusion is over, your IV will be removed.  Pressure may be applied to the IV site for a few minutes.  A bandage (dressing) will be applied. The procedure may vary among health care providers and hospitals. AFTER THE PROCEDURE  Your blood pressure, temperature, and pulse will be monitored regularly.   This information is not intended to replace advice given to you by your health care provider. Make sure you discuss any questions you have with your health care provider.   Document Released: 03/15/2000 Document Revised: 04/08/2014 Document Reviewed: 01/26/2014 Elsevier Interactive Patient Education [employer].

## 2015-07-03 NOTE — Progress Notes (Signed)
Diagnosis Association: Anemia due to other cause (285.8)  MD: Dr.Magrinat  Procedure: Pt received 2 units of PRBcs per porta cath  Pt tolerated infusion well. And porta cath was flushed per protocol.  Post procedure pt was alert, oriented and ambulatory at discharge

## 2015-07-04 ENCOUNTER — Ambulatory Visit (HOSPITAL_BASED_OUTPATIENT_CLINIC_OR_DEPARTMENT_OTHER): Payer: Medicare Other

## 2015-07-04 ENCOUNTER — Telehealth: Payer: Self-pay

## 2015-07-04 ENCOUNTER — Other Ambulatory Visit: Payer: Self-pay

## 2015-07-04 ENCOUNTER — Other Ambulatory Visit: Payer: Self-pay | Admitting: *Deleted

## 2015-07-04 ENCOUNTER — Other Ambulatory Visit (HOSPITAL_BASED_OUTPATIENT_CLINIC_OR_DEPARTMENT_OTHER): Payer: Medicare Other

## 2015-07-04 ENCOUNTER — Ambulatory Visit (HOSPITAL_COMMUNITY)
Admission: RE | Admit: 2015-07-04 | Discharge: 2015-07-04 | Disposition: A | Discharge status: Home / Self Care | Payer: Medicare Other | Source: Ambulatory Visit | Attending: Oncology | Admitting: Oncology

## 2015-07-04 ENCOUNTER — Ambulatory Visit (HOSPITAL_BASED_OUTPATIENT_CLINIC_OR_DEPARTMENT_OTHER): Payer: Medicare Other | Admitting: Oncology

## 2015-07-04 ENCOUNTER — Ambulatory Visit: Payer: Self-pay

## 2015-07-04 VITALS — BP 102/47 | HR 86 | Temp 97.9°F | Resp 18

## 2015-07-04 VITALS — BP 104/48 | HR 89 | Temp 98.2°F | Resp 18 | Ht 60.0 in | Wt 121.6 lb

## 2015-07-04 DIAGNOSIS — K2961 Other gastritis with bleeding: Secondary | ICD-10-CM

## 2015-07-04 DIAGNOSIS — R05 Cough: Secondary | ICD-10-CM

## 2015-07-04 DIAGNOSIS — I482 Chronic atrial fibrillation, unspecified: Secondary | ICD-10-CM

## 2015-07-04 DIAGNOSIS — Z7901 Long term (current) use of anticoagulants: Secondary | ICD-10-CM

## 2015-07-04 DIAGNOSIS — Z954 Presence of other heart-valve replacement: Secondary | ICD-10-CM

## 2015-07-04 DIAGNOSIS — D500 Iron deficiency anemia secondary to blood loss (chronic): Secondary | ICD-10-CM

## 2015-07-04 DIAGNOSIS — Z951 Presence of aortocoronary bypass graft: Secondary | ICD-10-CM | POA: Diagnosis not present

## 2015-07-04 DIAGNOSIS — K922 Gastrointestinal hemorrhage, unspecified: Secondary | ICD-10-CM | POA: Diagnosis not present

## 2015-07-04 DIAGNOSIS — R5383 Other fatigue: Secondary | ICD-10-CM | POA: Diagnosis not present

## 2015-07-04 DIAGNOSIS — I4891 Unspecified atrial fibrillation: Secondary | ICD-10-CM | POA: Diagnosis not present

## 2015-07-04 DIAGNOSIS — R5381 Other malaise: Secondary | ICD-10-CM

## 2015-07-04 DIAGNOSIS — R918 Other nonspecific abnormal finding of lung field: Secondary | ICD-10-CM | POA: Insufficient documentation

## 2015-07-04 DIAGNOSIS — I517 Cardiomegaly: Secondary | ICD-10-CM | POA: Diagnosis not present

## 2015-07-04 DIAGNOSIS — R059 Cough, unspecified: Secondary | ICD-10-CM

## 2015-07-04 DIAGNOSIS — J40 Bronchitis, not specified as acute or chronic: Secondary | ICD-10-CM | POA: Diagnosis not present

## 2015-07-04 DIAGNOSIS — D689 Coagulation defect, unspecified: Secondary | ICD-10-CM

## 2015-07-04 DIAGNOSIS — Z952 Presence of prosthetic heart valve: Secondary | ICD-10-CM | POA: Diagnosis not present

## 2015-07-04 DIAGNOSIS — I4819 Other persistent atrial fibrillation: Secondary | ICD-10-CM

## 2015-07-04 LAB — CBC WITH DIFFERENTIAL/PLATELET
BASO%: 0.2 % (ref 0.0–2.0)
Basophils Absolute: 0 10*3/uL (ref 0.0–0.1)
EOS%: 0.4 % (ref 0.0–7.0)
Eosinophils Absolute: 0 10*3/uL (ref 0.0–0.5)
HCT: 27.8 % — ABNORMAL LOW (ref 34.8–46.6)
HEMOGLOBIN: 9.3 g/dL — AB (ref 11.6–15.9)
LYMPH#: 0.5 10*3/uL — AB (ref 0.9–3.3)
LYMPH%: 9.8 % — ABNORMAL LOW (ref 14.0–49.7)
MCH: 31.3 pg (ref 25.1–34.0)
MCHC: 33.5 g/dL (ref 31.5–36.0)
MCV: 93.6 fL (ref 79.5–101.0)
MONO#: 0.4 10*3/uL (ref 0.1–0.9)
MONO%: 8.2 % (ref 0.0–14.0)
NEUT%: 81.4 % — ABNORMAL HIGH (ref 38.4–76.8)
NEUTROS ABS: 4.1 10*3/uL (ref 1.5–6.5)
NRBC: 0 % (ref 0–0)
Platelets: 151 10*3/uL (ref 145–400)
RBC: 2.97 10*6/uL — ABNORMAL LOW (ref 3.70–5.45)
RDW: 20.6 % — AB (ref 11.2–14.5)
WBC: 5 10*3/uL (ref 3.9–10.3)

## 2015-07-04 LAB — TYPE AND SCREEN
ABO/RH(D): O POS
ANTIBODY SCREEN: NEGATIVE
UNIT DIVISION: 0
Unit division: 0

## 2015-07-04 LAB — PROTIME-INR
INR: 2.7 (ref 2.00–3.50)
Protime: 32.4 Seconds — ABNORMAL HIGH (ref 10.6–13.4)

## 2015-07-04 MED ORDER — SODIUM CHLORIDE 0.9 % IJ SOLN
10.0000 mL | INTRAMUSCULAR | Status: DC | PRN
  Administered 2015-07-04: 10 mL
  Filled 2015-07-04: qty 10

## 2015-07-04 MED ORDER — HEPARIN SOD (PORK) LOCK FLUSH 100 UNIT/ML IV SOLN
500.0000 [IU] | Freq: Once | INTRAVENOUS | Status: AC | PRN
  Administered 2015-07-04: 500 [IU]
  Filled 2015-07-04: qty 5

## 2015-07-04 MED ORDER — AZITHROMYCIN 250 MG PO TABS: ORAL_TABLET | ORAL | Status: DC

## 2015-07-04 MED ORDER — SODIUM CHLORIDE 0.9 % IV SOLN
510.0000 mg | Freq: Once | INTRAVENOUS | Status: AC
  Administered 2015-07-04: 510 mg via INTRAVENOUS
  Filled 2015-07-04: qty 17

## 2015-07-04 MED ORDER — SODIUM CHLORIDE 0.9 % IV SOLN
Freq: Once | INTRAVENOUS | Status: AC
  Administered 2015-07-04: 11:00:00 via INTRAVENOUS

## 2015-07-04 NOTE — Progress Notes (Signed)
ID: Deborah [name]   DOB: [birth year]  MR#: [id number]  UXL#:[id number]  PCP: Deborah Argyle, MD GYN:  SU:  OTHER MD: Deborah [name], Deborah [name]   HISTORY OF PRESENT ILLNESS: From the earlier summary:  Deborah [name]") Deborah [name] is an 82 year old [place] woman referred April 2014  by Deborah [name] for evaluation and treatment of iron deficiency anemia. The patient has a documented history of chronic GI bleeding, with prior GI evaluation showing a hiatal hernia, multiple gastric polyps, and diverticular disease. She has been treated with various iron preparations, most recently with Niferex, with poor tolerance. Despite everyone's best efforts her hemoglobin has continued to drop, from 12.4 on 06/03/2012 to10.6 on 07/01/2012 and 9.6.  The GI-bleeding problem is complicated by a history of AVR and A-fib, on chronic anticoagulation. She had what appears to have been a TIA September 2016.  Her subsequent history is as detailed below.  INTERVAL HISTORY: Deborah [name] was scheduled for iron infusion today but called to complain of shortness of breath and cough. We set her up for a chest x-ray and asked her to drop in for evaluation. She also received 2 units of blood yesterday. She tells me she did well with that except she always gets a little short of breath she says for about a day when she gets a transfusion.  REVIEW OF SYSTEMS: She said she's had a little bit of a cough for the last 3 days but is getting a little bit worse and she is now making some yellow phlegm, whereas before it was yellow. She thinks she may have had a fever but she did not take her temperature. She has had some dark but not black bowel movements in the last 2 days. Her current warfarin dose is 5/5/2.5. Aside from these issues she is planning to go to the beach April 10 and return April 17. A detailed review of systems today was otherwise stable  PAST MEDICAL HISTORY: Past Medical History  Diagnosis Date  .  Hypothyroidism   . Stomach problems     seeing Dr Deborah [name]  . Hypertension   . GERD (gastroesophageal reflux disease)   . Anemia   . Arrhythmia     Atrial Fibrillation  . Breast cancer ([place])     "right; S/P lumpectomy, chemo, XRT"   . Complication of anesthesia     "once I'm awakened, I don't sleep til the following night"  . High cholesterol   . Heart murmur   . History of blood transfusion 1996; ~ 2013    "related to valve replacement; GI bleeding"   . Chronic lower GI bleeding   . Arthritis     "fingers, toes, hips; qwhere" (05/19/2013)  . Gout   . Pneumonia 07/2011  . Rectus sheath hematoma 12/19/14     held anticoagulation for 1 week, transfused  . TIA (transient ischemic attack) 12/24/14    symptoms resolved  . History of nuclear stress test     Myoview 1/17: EF 59%, normal perfusion, low risk  . History of echocardiogram     Echo 1/17: mild LVH, EF 50-55%, no RWMA, mild AI, mechanical AVR with mean 21 mmHg/peak 42 mmHg (stable since 2012), mod MR, mod to severe BAE, PASP 36 mmHg    PAST SURGICAL HISTORY: Past Surgical History  Procedure Laterality Date  . Arteriogram  01/12    NO RAS   . Cardioversion      X 2  . Cardiac catheterization    .  Cataract extraction w/ intraocular lens  implant, bilateral Bilateral     bilateral cataract with lens implants  . Cholecystectomy    . Abdominal hysterectomy    . Appendectomy    . Colonoscopy with propofol N/A 11/24/2012    Procedure: COLONOSCOPY WITH PROPOFOL;  Surgeon: Jeryl Columbia, MD;  Location: [facility];  Service: Endoscopy;  Laterality: N/A;  . Breast biopsy Right   . Breast lumpectomy Right   . Aortic valve replacement  1996    [employer]    FAMILY HISTORY Family History  Problem Relation Age of Onset  . Dementia Mother   . Coronary artery disease Father   . Breast cancer Daughter   . Colon cancer Sister   . Liver cancer Sister   . Lung cancer Brother   . COPD Brother    the patient's father died from a  myocardial infarction at the age of 82. The patient's mother died with Alzheimer's disease at the age of 79. She had 2 brothers and one sister. Her sister had colon cancer diagnosed at age 50. Otherwise the only other person with cancer in the family was the patient's mother's twin sister, diagnosed with breast cancer at age 45.  GYNECOLOGIC HISTORY: Menarche age 33, first live birth age 8, she is [place] P3. Underwent menopause 1972. She took hormone replacement for approximately 21 years.  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 Her daughter Deborah [name] runs the [employer] here. Daughter Deborah [name] also lives in [place], is a homemaker. Son Deborah [name] "Deborah [name]" Jasko is a Scientist, research (medical). The patient has of 10 grandchildren She attends a [employer].   ADVANCED DIRECTIVES: Living will in place. Her daughter Deborah [name] is her healthcare power of attorney.  HEALTH MAINTENANCE: Social History  Substance Use Topics  . Smoking status: Former Smoker -- 1.00 packs/day for 20 years    Types: Cigarettes    Quit date: 04/01/1988  . Smokeless tobacco: Never Used  . Alcohol Use: 4.8 oz/week    4 Glasses of wine, 4 Shots of liquor per week     Comment: burbon or glass wine daily     Allergies  Allergen Reactions  . Amiodarone Shortness Of Breath  . Alendronate Sodium Other (See Comments)    GI upset  . Macrodantin [Nitrofurantoin] Other (See Comments)    GI upset  . Meclizine Swelling    tongue  . Norvasc [Amlodipine Besylate] Swelling    edema  . Tussionex Pennkinetic Er [Hydrocod Polst-Cpm Polst Er] Other (See Comments)    Reaction unknown    Current Outpatient Prescriptions  Medication Sig Dispense Refill  . acetaminophen (TYLENOL) 500 MG tablet Take 1,000 mg by mouth every 6 (six) hours as needed for headache.    . allopurinol (ZYLOPRIM) 300 MG tablet Take 300 mg by mouth daily.    Marland Kitchen amLODipine (NORVASC) 5 MG tablet Take 5  mg by mouth daily.     . calcium-vitamin D 500 MG tablet Take 1 tablet by mouth 2 (two) times daily.      . celecoxib (CELEBREX) 200 MG capsule Take 200 mg by mouth. Every 2-3 days  4  . cloNIDine (CATAPRES) 0.1 MG tablet Take 0.2 mg by mouth 2 (two) times daily.    Marland Kitchen dicyclomine (BENTYL) 10 MG capsule Take 10 mg by mouth daily as needed for spasms.     . digoxin (LANOXIN) 0.125 MG tablet Take 0.125 mg by mouth daily.    Marland Kitchen  fexofenadine (ALLEGRA ALLERGY) 180 MG tablet Take 180 mg by mouth daily as needed for allergies.     . hydrochlorothiazide (HYDRODIURIL) 25 MG tablet Take 1 tablet (25 mg total) by mouth every evening. 90 tablet 3  . levothyroxine (SYNTHROID, LEVOTHROID) 88 MCG tablet Take 88 mcg by mouth daily before breakfast.    . lidocaine-prilocaine (EMLA) cream Apply 1 application topically as needed (prior to infusion at porta cath site). 30 g 1  . losartan (COZAAR) 100 MG tablet Take 1 tablet (100 mg total) by mouth every evening. 90 tablet 3  . multivitamin (THERAGRAN) per tablet Take 1 tablet by mouth daily.     Marland Kitchen NITROSTAT 0.4 MG SL tablet Place 0.4 mg under the tongue every 5 (five) minutes as needed for chest pain (x 3 doses).   1  . Omega-3 Fatty Acids (FISH OIL) 1000 MG CAPS Take 1,000 mg by mouth 2 (two) times daily.     . simvastatin (ZOCOR) 20 MG tablet TAKE ONE-HALF TABLET DAILY 30 tablet 3  . spironolactone (ALDACTONE) 25 MG tablet Take 25 mg by mouth 2 (two) times daily.  5  . warfarin (COUMADIN) 5 MG tablet Take 1 tablet (5 mg total) by mouth daily. 30 tablet 3  . zolpidem (AMBIEN CR) 12.5 MG CR tablet Take 12.5 mg by mouth at bedtime as needed for sleep.     No current facility-administered medications for this visit.    OBJECTIVE: Elderly white womanWho appears stated age 72 Vitals:   07/04/15 1100  BP: 104/48  Pulse: 89  Temp: 98.2 F (36.8 C)  Resp: 18     Body mass index is 23.75 kg/(m^2).    ECOG FS: 1  Sclerae unicteric, pupils [name] and  equal Oropharynx clear and moist-- no thrush or other lesions No cervical or supraclavicular adenopathy Lungs no rales or rhonchi Heart regular rate and rhythm Abd soft, nontender, positive bowel sounds MSK no focal spinal tenderness, no upper extremity lymphedema Neuro: nonfocal, well oriented, appropriate affect Breasts: Deferred   Lab Results  Component Value Date   WBC 5.6 07/03/2015   NEUTROABS 4.8 07/03/2015   HGB 8.5* 07/03/2015   HCT 25.6* 07/03/2015   MCV 98.3 07/03/2015   PLT 153 07/03/2015      Chemistry      Component Value Date/Time   NA 132* 06/02/2015 0919   NA 133* 12/24/2014 2239   K 5.3* 06/02/2015 0919   K 4.2 12/24/2014 2239   CL 97* 12/24/2014 2239   CO2 21* 06/02/2015 0919   CO2 27 12/24/2014 2200   BUN 65.4* 06/02/2015 0919   BUN 33* 12/24/2014 2239   CREATININE 1.5* 06/02/2015 0919   CREATININE 0.90 12/24/2014 2239      Component Value Date/Time   CALCIUM 9.0 06/02/2015 0919   CALCIUM 9.8 12/24/2014 2200   ALKPHOS 50 06/02/2015 0919   ALKPHOS 65 12/24/2014 2200   AST 18 06/02/2015 0919   AST 46* 12/24/2014 2200   ALT 20 06/02/2015 0919   ALT 28 12/24/2014 2200   BILITOT <0.30 06/02/2015 0919   BILITOT 1.3* 12/24/2014 2200      No results found for: LABCA2  No components found for: LABCA125   Recent Labs Lab 07/04/15 1008  INR 2.70    Urinalysis    Component Value Date/Time   COLORURINE YELLOW 12/17/2014 1805   APPEARANCEUR CLOUDY* 12/17/2014 1805   LABSPEC 1.010 03/22/2015 0917   LABSPEC 1.010 12/17/2014 1805   PHURINE 6.0 03/22/2015 3710  PHURINE 6.0 12/17/2014 1805   GLUCOSEU Negative 03/22/2015 0917   GLUCOSEU NEGATIVE 12/17/2014 1805   GLUCOSEU NEGATIVE 04/07/2013 1123   HGBUR Negative 03/22/2015 0917   HGBUR TRACE* 12/17/2014 1805   BILIRUBINUR Negative 03/22/2015 [place] 12/17/2014 1805   KETONESUR Negative 03/22/2015 0917   KETONESUR 15* 12/17/2014 1805   PROTEINUR Negative 03/22/2015  0917   PROTEINUR 100* 12/17/2014 1805   UROBILINOGEN 0.2 03/22/2015 0917   UROBILINOGEN 0.2 12/17/2014 1805   NITRITE Negative 03/22/2015 0917   NITRITE NEGATIVE 12/17/2014 1805   LEUKOCYTESUR Negative 03/22/2015 0917   LEUKOCYTESUR TRACE* 12/17/2014 1805   STUDIES: Dg Chest 2 View  07/04/2015  CLINICAL DATA:  Chronic atrial fibrillation. Anticoagulants history. Fifteen . shortness of breath. Cough. EXAM: CHEST  2 VIEW COMPARISON:  12/24/2014. FINDINGS: Prior port catheter with the tip projected over the left side of the mediastinum is again noted. 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 Prior CABG. Stable cardiomegaly. Mild ill-defined infiltrate right upper lobe. Close follow-up chest x-rays to demonstrate clearing suggested to exclude underlying nodule or mass lesion. No pleural effusion or pneumothorax. Surgical clips right axilla. Degenerative changes osteopenia thoracic spine . IMPRESSION: 1. Mild infiltrate right upper lobe. Close follow-up chest x-rays are suggested to demonstrate clearing to exclude underlying nodule or mass lesion. Followup PA and lateral chest X-ray is recommended in 3-4 weeks following trial of antibiotic therapy to ensure resolution and exclude underlying malignancy. 2. PowerPort catheter in stable position as above. Prior CABG. Stable cardiomegaly. Electronically Signed   By: [name]  Register   On: 07/04/2015 11:32     ASSESSMENT: 82 y.o. [place] with iron deficiency anemia secondary to chronic blood loss, s/p oral iron supplementation with poor tolerance and inadequate response  (1) Feraheme started 07/24/2012, repeated whenever ferritin drops <100, mosr recent dose 07/04/2015  (2) transfusing PBBCs for Hb < 9.0 or symptoms  (3) chronic GIB extensively evaluated by GI with findings including AVM (cauterized), diverticular disease, hemorrhoids and gastric polyps  (4) on lifelong anticoagulation for AFib and mechanical aortic valve  (a)  on warfarin until June 2016, when she was switched to Lovenox  (b) Lovenox discontinued September 2016 after rectus sheath hematoma developed  (c) warfarin resumed 12/27/2014 goal being an INR between 2.0 and 2.5   PLAN: Deborah [name] Did well with her transfusion yesterday and she is receiving some iron today.  In between she has developed a cough. The chest x-ray suggests a mild infiltrate in the right lung and I am starting her on a Z-Pak. This may affect her INR which is already on the high side for her at 2.7. I asked her to cut back on the warfarin to 2.5 mg to the next 3 days and then resume her 5/5/2.5 as before.  She tells me she is going to be going to the beach on April 10 and not returning until the 17th. I think this is fine for this certainly know to call if any problems develop while out of town. However I will let her daughter know of today's visit. Otherwise Deborah [name] will return April 18 and we will resume her routine lab work at that time.  She knows to call for any problems that may develop before then.  MAGRINAT,GUSTAV C    07/04/2015

## 2015-07-04 NOTE — Telephone Encounter (Signed)
Patient has had a cough since last Friday and states that the mucus she has been coughing up changed to yellow yesterday.  Per Dr. Jana Hakim patient is to go over to have a chest xray this morning and see him afterwards.

## 2015-07-04 NOTE — Progress Notes (Signed)
INR- 2.7 Check Dr. Virgie Dad note.  Patient to go on the zpak today for productive cough with yellow mucus.

## 2015-07-04 NOTE — Patient Instructions (Signed)

## 2015-07-05 ENCOUNTER — Telehealth: Payer: Self-pay | Admitting: *Deleted

## 2015-07-05 NOTE — Telephone Encounter (Signed)
TC from patient this afternoon to report to Dr. Virgie Dad nurse that she had been having constipation but did have BM yesterday and today-some of it was formed and some looked like 'grains of sand', all black in color. Pt stated Val, RN had asked her to call back about her bowel movements. Pt requesting call back from Dr. Virgie Dad nurse.

## 2015-07-06 ENCOUNTER — Other Ambulatory Visit: Payer: Medicare Other

## 2015-07-06 NOTE — Telephone Encounter (Signed)
This RN spoke with pt per her concerns. Character of stools discussed with pt understanding concerns regarding stool looking like " dark sand dumped in the toilet " which usually means bleeding from stomach or upper intestines.  Deborah [name] states she has " some stool that looks like sand but really more when I wipe " She is also having to strain more with stools.  Deborah [name] denies any stools that are " gelatinous or looks like jelly ".  Otherwise pt is " feeling awful from this upper respiratory infection "  Plan per call is pt will increase stools softners to 2 a day. She will call this RN tomorrow with update.

## 2015-07-07 ENCOUNTER — Telehealth: Payer: Self-pay | Admitting: *Deleted

## 2015-07-07 ENCOUNTER — Other Ambulatory Visit: Payer: Self-pay | Admitting: *Deleted

## 2015-07-07 NOTE — Telephone Encounter (Signed)
This RN returned call to pt per request due to recent changes in bowels.  At present Deborah [name] states she has not " had a good BM since Wednesday this week "  She believes she she a " small stool yesterday am "  Deborah [name] has increased gas.  She has

## 2015-07-08 ENCOUNTER — Other Ambulatory Visit: Payer: Self-pay | Admitting: Oncology

## 2015-07-12 ENCOUNTER — Telehealth: Payer: Self-pay | Admitting: *Deleted

## 2015-07-12 NOTE — Telephone Encounter (Signed)
Pt called to this RN to state ongoing upper respiratory symptoms post completing z pak per MD.  Deborah [name] states overall congestion, " cough that wants to be more productive then it is and just feeling lousy"  Pt is running low grade temp on 99.5.  Per MD review request is to come in for xray, lab and see symptom management for further recommendations in this patient with known anemia and coagulation concerns ( on coumadin ).  Pt aware of above recommendation.  URGENT pof sent and attempted to contact " urgent scheduler " -unable to leave message per automated message stating VM not in use "

## 2015-07-12 NOTE — Telephone Encounter (Signed)
See other entry 

## 2015-07-13 ENCOUNTER — Other Ambulatory Visit: Payer: Self-pay | Admitting: Nurse Practitioner

## 2015-07-13 ENCOUNTER — Encounter: Payer: Medicare Other | Admitting: Nurse Practitioner

## 2015-07-13 ENCOUNTER — Other Ambulatory Visit: Payer: Self-pay | Admitting: *Deleted

## 2015-07-13 ENCOUNTER — Ambulatory Visit (HOSPITAL_BASED_OUTPATIENT_CLINIC_OR_DEPARTMENT_OTHER): Payer: Medicare Other | Admitting: Nurse Practitioner

## 2015-07-13 ENCOUNTER — Other Ambulatory Visit (HOSPITAL_BASED_OUTPATIENT_CLINIC_OR_DEPARTMENT_OTHER): Payer: Medicare Other

## 2015-07-13 ENCOUNTER — Ambulatory Visit (HOSPITAL_COMMUNITY)
Admission: RE | Admit: 2015-07-13 | Discharge: 2015-07-13 | Disposition: A | Discharge status: Home / Self Care | Payer: Medicare Other | Source: Ambulatory Visit | Attending: Oncology | Admitting: Oncology

## 2015-07-13 ENCOUNTER — Telehealth: Payer: Self-pay | Admitting: Nurse Practitioner

## 2015-07-13 ENCOUNTER — Other Ambulatory Visit: Payer: Medicare Other

## 2015-07-13 ENCOUNTER — Ambulatory Visit: Payer: Medicare Other

## 2015-07-13 ENCOUNTER — Ambulatory Visit (HOSPITAL_BASED_OUTPATIENT_CLINIC_OR_DEPARTMENT_OTHER): Payer: Medicare Other

## 2015-07-13 VITALS — BP 171/70 | HR 86 | Temp 97.9°F | Resp 18

## 2015-07-13 VITALS — BP 140/52 | HR 92 | Temp 97.9°F | Resp 18 | Ht 60.0 in | Wt 122.2 lb

## 2015-07-13 DIAGNOSIS — I119 Hypertensive heart disease without heart failure: Secondary | ICD-10-CM | POA: Insufficient documentation

## 2015-07-13 DIAGNOSIS — D500 Iron deficiency anemia secondary to blood loss (chronic): Secondary | ICD-10-CM

## 2015-07-13 DIAGNOSIS — I482 Chronic atrial fibrillation, unspecified: Secondary | ICD-10-CM

## 2015-07-13 DIAGNOSIS — R5381 Other malaise: Secondary | ICD-10-CM | POA: Insufficient documentation

## 2015-07-13 DIAGNOSIS — K922 Gastrointestinal hemorrhage, unspecified: Secondary | ICD-10-CM | POA: Diagnosis present

## 2015-07-13 DIAGNOSIS — Z7901 Long term (current) use of anticoagulants: Secondary | ICD-10-CM

## 2015-07-13 DIAGNOSIS — R5383 Other fatigue: Secondary | ICD-10-CM

## 2015-07-13 DIAGNOSIS — R05 Cough: Secondary | ICD-10-CM

## 2015-07-13 DIAGNOSIS — J40 Bronchitis, not specified as acute or chronic: Secondary | ICD-10-CM | POA: Insufficient documentation

## 2015-07-13 DIAGNOSIS — Z959 Presence of cardiac and vascular implant and graft, unspecified: Secondary | ICD-10-CM | POA: Insufficient documentation

## 2015-07-13 DIAGNOSIS — I4891 Unspecified atrial fibrillation: Secondary | ICD-10-CM | POA: Diagnosis not present

## 2015-07-13 DIAGNOSIS — I517 Cardiomegaly: Secondary | ICD-10-CM | POA: Insufficient documentation

## 2015-07-13 DIAGNOSIS — R059 Cough, unspecified: Secondary | ICD-10-CM

## 2015-07-13 DIAGNOSIS — Z95828 Presence of other vascular implants and grafts: Secondary | ICD-10-CM

## 2015-07-13 LAB — CBC WITH DIFFERENTIAL/PLATELET
BASO%: 0.1 % (ref 0.0–2.0)
Basophils Absolute: 0 10*3/uL (ref 0.0–0.1)
EOS%: 0.4 % (ref 0.0–7.0)
Eosinophils Absolute: 0 10*3/uL (ref 0.0–0.5)
HEMATOCRIT: 24.1 % — AB (ref 34.8–46.6)
HEMOGLOBIN: 7.6 g/dL — AB (ref 11.6–15.9)
LYMPH#: 0.7 10*3/uL — AB (ref 0.9–3.3)
LYMPH%: 9 % — ABNORMAL LOW (ref 14.0–49.7)
MCH: 33.2 pg (ref 25.1–34.0)
MCHC: 31.5 g/dL (ref 31.5–36.0)
MCV: 105.2 fL — ABNORMAL HIGH (ref 79.5–101.0)
MONO#: 0.5 10*3/uL (ref 0.1–0.9)
MONO%: 6.6 % (ref 0.0–14.0)
NEUT%: 83.9 % — ABNORMAL HIGH (ref 38.4–76.8)
NEUTROS ABS: 6.4 10*3/uL (ref 1.5–6.5)
NRBC: 0 % (ref 0–0)
Platelets: 251 10*3/uL (ref 145–400)
RBC: 2.29 10*6/uL — ABNORMAL LOW (ref 3.70–5.45)
RDW: 24 % — ABNORMAL HIGH (ref 11.2–14.5)
WBC: 7.7 10*3/uL (ref 3.9–10.3)

## 2015-07-13 LAB — PROTIME-INR
INR: 1.9 — ABNORMAL LOW (ref 2.00–3.50)
Protime: 22.8 Seconds — ABNORMAL HIGH (ref 10.6–13.4)

## 2015-07-13 LAB — PREPARE RBC (CROSSMATCH)

## 2015-07-13 MED ORDER — DIPHENHYDRAMINE HCL 25 MG PO CAPS
25.0000 mg | ORAL_CAPSULE | Freq: Once | ORAL | Status: AC
  Administered 2015-07-13: 25 mg via ORAL

## 2015-07-13 MED ORDER — DIPHENHYDRAMINE HCL 25 MG PO CAPS
ORAL_CAPSULE | ORAL | Status: AC
  Filled 2015-07-13: qty 1

## 2015-07-13 MED ORDER — SODIUM CHLORIDE 0.9 % IV SOLN
250.0000 mL | Freq: Once | INTRAVENOUS | Status: AC
  Administered 2015-07-13: 250 mL via INTRAVENOUS

## 2015-07-13 MED ORDER — LEVOFLOXACIN 500 MG PO TABS: 500.0000 mg | ORAL_TABLET | Freq: Every day | ORAL | Status: DC

## 2015-07-13 MED ORDER — SODIUM CHLORIDE 0.9% FLUSH
10.0000 mL | INTRAVENOUS | Status: DC | PRN
  Administered 2015-07-13 (×2): 10 mL via INTRAVENOUS
  Filled 2015-07-13: qty 10

## 2015-07-13 MED ORDER — ACETAMINOPHEN 325 MG PO TABS
ORAL_TABLET | ORAL | Status: AC
  Filled 2015-07-13: qty 2

## 2015-07-13 MED ORDER — ALBUTEROL SULFATE (2.5 MG/3ML) 0.083% IN NEBU
2.5000 mg | INHALATION_SOLUTION | Freq: Once | RESPIRATORY_TRACT | Status: AC
  Administered 2015-07-13: 2.5 mg via RESPIRATORY_TRACT
  Filled 2015-07-13: qty 3

## 2015-07-13 MED ORDER — ALBUTEROL SULFATE (2.5 MG/3ML) 0.083% IN NEBU
INHALATION_SOLUTION | RESPIRATORY_TRACT | Status: AC
  Filled 2015-07-13: qty 3

## 2015-07-13 MED ORDER — HEPARIN SOD (PORK) LOCK FLUSH 100 UNIT/ML IV SOLN
500.0000 [IU] | Freq: Every day | INTRAVENOUS | Status: AC | PRN
  Administered 2015-07-13: 500 [IU]
  Filled 2015-07-13: qty 5

## 2015-07-13 MED ORDER — ACETAMINOPHEN 325 MG PO TABS
650.0000 mg | ORAL_TABLET | Freq: Once | ORAL | Status: AC
  Administered 2015-07-13: 650 mg via ORAL

## 2015-07-13 MED ORDER — SODIUM CHLORIDE 0.9% FLUSH
10.0000 mL | INTRAVENOUS | Status: DC | PRN
  Filled 2015-07-13: qty 10

## 2015-07-13 NOTE — Progress Notes (Signed)
Patient's post blood transfusion VS were:  Blood pressure 171/70, pulse 86, temperature 97.9 F (36.6 C), temperature source Oral, resp. rate 18, SpO2 99 %.  Selena Lesser saw patient and ok'ed patient for discharge. She was instructed to take her BP meds tonight.  Ma Hillock, RN

## 2015-07-13 NOTE — Patient Instructions (Signed)

## 2015-07-13 NOTE — Telephone Encounter (Signed)
per pof to sch pt 1 pint blood-per MW she will sch blood/flush-pt in inf

## 2015-07-14 LAB — TYPE AND SCREEN
ABO/RH(D): O POS
Antibody Screen: NEGATIVE
UNIT DIVISION: 0
UNIT DIVISION: 0

## 2015-07-16 ENCOUNTER — Encounter: Payer: Self-pay | Admitting: Nurse Practitioner

## 2015-07-16 NOTE — Progress Notes (Signed)
Addendum: CXR obtained today revealed no acute findings.

## 2015-07-16 NOTE — Progress Notes (Signed)
#   2 Addendum: Correction from visit note- pt states that she continues with a dry cough; but has no other new symptoms. She denies any fever or chills.   Will prescribe levaquin for any residual URI/bronchitis symptoms.

## 2015-07-16 NOTE — Progress Notes (Signed)
SYMPTOM MANAGEMENT CLINIC    Chief Complaint: Anemia  HPI:  Deborah [name] 82 y.o. female diagnosed with iron deficiency anemia secondary to chronic GI bleed.  Currently undergoing Feraheme infusions on an as-needed basis.   Patient has a history of chronic iron deficiency anemia secondary to chronic GI bleed.  She receives Feraheme infusions on a fairly regular basis.  Her last iron infusion was 07/04/2015.  Patient presented to the [facility] today with complaint of increased fatigue.  She denied any shortness of breath with exertion.  Blood counts obtained today revealed a WBC of 7.7, ANC 6.4, hemoglobin has decreased from 9.3 down to 7.6, and platelet count is 251.  Patient will receive 2 units packed red blood cells for treatment of anemia.  Patient states that she has Lasix that she takes on a as needed basis for fluid overload.  Patient plans to take Lasix first thing in the morning tomorrow.  Will schedule patient for labs, type and hold, and visited with symptom management clinic on Monday, 07/17/2015.   No history exists.    Review of Systems  Constitutional: Positive for malaise/fatigue. Negative for fever and chills.  Neurological: Negative for weakness.  All other systems reviewed and are negative.   Past Medical History  Diagnosis Date  . Hypothyroidism   . Stomach problems     seeing Dr Watt Climes  . Hypertension   . GERD (gastroesophageal reflux disease)   . Anemia   . Arrhythmia     Atrial Fibrillation  . Breast cancer ([place])     "right; S/P lumpectomy, chemo, XRT"   . Complication of anesthesia     "once I'm awakened, I don't sleep til the following night"  . High cholesterol   . Heart murmur   . History of blood transfusion 1996; ~ 2013    "related to valve replacement; GI bleeding"   . Chronic lower GI bleeding   . Arthritis     "fingers, toes, hips; qwhere" (05/19/2013)  . Gout   . Pneumonia 07/2011  . Rectus sheath hematoma 12/19/14     held  anticoagulation for 1 week, transfused  . TIA (transient ischemic attack) 12/24/14    symptoms resolved  . History of nuclear stress test     Myoview 1/17: EF 59%, normal perfusion, low risk  . History of echocardiogram     Echo 1/17: mild LVH, EF 50-55%, no RWMA, mild AI, mechanical AVR with mean 21 mmHg/peak 42 mmHg (stable since 2012), mod MR, mod to severe BAE, PASP 36 mmHg    Past Surgical History  Procedure Laterality Date  . Arteriogram  01/12    NO RAS   . Cardioversion      X 2  . Cardiac catheterization    . Cataract extraction w/ intraocular lens  implant, bilateral Bilateral     bilateral cataract with lens implants  . Cholecystectomy    . Abdominal hysterectomy    . Appendectomy    . Colonoscopy with propofol N/A 11/24/2012    Procedure: COLONOSCOPY WITH PROPOFOL;  Surgeon: Jeryl Columbia, MD;  Location: [facility];  Service: Endoscopy;  Laterality: N/A;  . Breast biopsy Right   . Breast lumpectomy Right   . Aortic valve replacement  1996    [employer]    has Hypothyroidism; Benign hypertensive heart disease without heart failure; S/P aortic valve replacement with metallic valve; Hypokalemia; Hyponatremia; Bronchitis; Nonsustained ventricular tachycardia ([place]); Long term current use of anticoagulant therapy; Atrial fibrillation (  [place]); Dyslipidemia; Malaise and fatigue; Iron deficiency anemia due to chronic blood loss; Coagulopathy ([place]); Melena; Poor venous access; Pain; Rectus sheath hematoma; Acute blood loss anemia; Hematoma; Absolute anemia; Dysarthria; HLD (hyperlipidemia); Essential hypertension; Gout; GERD (gastroesophageal reflux disease); Cerebral thrombosis with cerebral infarction [facility]); TIA (transient ischemic attack); and GIB (gastrointestinal bleeding) on her problem list.    is allergic to amiodarone; alendronate sodium; macrodantin; meclizine; norvasc; and tussionex pennkinetic er.    Medication List       This list is accurate as of: 07/13/15 11:59 PM.   Always use your most recent med list.               acetaminophen 500 MG tablet  Commonly known as:  TYLENOL  Take 1,000 mg by mouth every 6 (six) hours as needed for headache.     ALLEGRA ALLERGY 180 MG tablet  Generic drug:  fexofenadine  Take 180 mg by mouth daily as needed for allergies.     allopurinol 300 MG tablet  Commonly known as:  ZYLOPRIM  Take 300 mg by mouth daily.     amLODipine 5 MG tablet  Commonly known as:  NORVASC  Take 5 mg by mouth daily.     BENTYL 10 MG capsule  Generic drug:  dicyclomine  Take 10 mg by mouth daily as needed for spasms.     calcium-vitamin D 500 MG tablet  Take 1 tablet by mouth 2 (two) times daily.     celecoxib 200 MG capsule  Commonly known as:  CELEBREX  Take 200 mg by mouth. Every 2-3 days     cloNIDine 0.1 MG tablet  Commonly known as:  CATAPRES  Take 0.2 mg by mouth 2 (two) times daily.     digoxin 0.125 MG tablet  Commonly known as:  LANOXIN  Take 0.125 mg by mouth daily.     Fish Oil 1000 MG Caps  Take 1,000 mg by mouth 2 (two) times daily.     hydrochlorothiazide 25 MG tablet  Commonly known as:  HYDRODIURIL  Take 1 tablet (25 mg total) by mouth every evening.     levofloxacin 500 MG tablet  Commonly known as:  LEVAQUIN  Take 1 tablet (500 mg total) by mouth daily.     levothyroxine 88 MCG tablet  Commonly known as:  SYNTHROID, LEVOTHROID  Take 88 mcg by mouth daily before breakfast.     lidocaine-prilocaine cream  Commonly known as:  EMLA  Apply 1 application topically as needed (prior to infusion at porta cath site).     losartan 100 MG tablet  Commonly known as:  COZAAR  Take 1 tablet (100 mg total) by mouth every evening.     multivitamin per tablet  Take 1 tablet by mouth daily.     NITROSTAT 0.4 MG SL tablet  Generic drug:  nitroGLYCERIN  Place 0.4 mg under the tongue every 5 (five) minutes as needed for chest pain (x 3 doses).     simvastatin 20 MG tablet  Commonly known as:  ZOCOR  TAKE  ONE-HALF TABLET DAILY     spironolactone 25 MG tablet  Commonly known as:  ALDACTONE  Take 25 mg by mouth 2 (two) times daily.     warfarin 5 MG tablet  Commonly known as:  COUMADIN  TAKE ONE TABLET BY MOUTH ONCE DAILY     zolpidem 12.5 MG CR tablet  Commonly known as:  AMBIEN CR  Take 12.5 mg by mouth at bedtime as  needed for sleep.         PHYSICAL EXAMINATION  Oncology Vitals 07/13/2015 07/13/2015  Temp 97.9 98.9  Pulse 86 87  Resp 18 17  SpO2 99 100   BP Readings from Last 2 Encounters:  07/13/15 171/70  07/13/15 140/52    Physical Exam  Constitutional: She is oriented to person, place, and time and well-developed, well-nourished, and in no distress.  HENT:  Head: Normocephalic and atraumatic.  Mouth/Throat: Oropharynx is clear and moist.  Eyes: Conjunctivae and EOM are normal. Pupils are equal, round, and reactive to light. Right eye exhibits no discharge. Left eye exhibits no discharge. No scleral icterus.  Neck: Normal range of motion. Neck supple. No JVD present. No tracheal deviation present. No thyromegaly present.  Cardiovascular: Normal rate, regular rhythm, normal heart sounds and intact distal pulses.   Pulmonary/Chest: Effort normal and breath sounds normal. No respiratory distress. She has no wheezes. She has no rales. She exhibits no tenderness.  Abdominal: Soft. Bowel sounds are normal. She exhibits no distension and no mass. There is no tenderness. There is no rebound and no guarding.  Musculoskeletal: Normal range of motion. She exhibits no edema or tenderness.  Lymphadenopathy:    She has no cervical adenopathy.  Neurological: She is alert and oriented to person, place, and time. Gait normal.  Skin: Skin is warm and dry. No rash noted. No erythema. No pallor.  Psychiatric: Affect normal.    LABORATORY DATA:. Hospital Outpatient Visit on 07/13/2015  Component Date Value Ref Range Status  . Order Confirmation 07/13/2015 ORDER PROCESSED BY BLOOD BANK    Final  . ABO/RH(D) 07/13/2015 O POS   Final  . Antibody Screen 07/13/2015 NEG   Final  . Sample Expiration 07/13/2015 07/16/2015   Final  . Unit Number 07/13/2015 [id number]   Final  . Blood Component Type 07/13/2015 RBC, LR IRR   Final  . Unit division 07/13/2015 00   Final  . Status of Unit 07/13/2015 ISSUED,FINAL   Final  . Transfusion Status 07/13/2015 OK TO TRANSFUSE   Final  . Crossmatch Result 07/13/2015 Compatible   Final  . Unit Number 07/13/2015 [id number]   Final  . Blood Component Type 07/13/2015 RED CELLS,LR   Final  . Unit division 07/13/2015 00   Final  . Status of Unit 07/13/2015 ISSUED,FINAL   Final  . Transfusion Status 07/13/2015 OK TO TRANSFUSE   Final  . Crossmatch Result 07/13/2015 Compatible   Final  Appointment on 07/13/2015  Component Date Value Ref Range Status  . Protime 07/13/2015 22.8* 10.6 - 13.4 Seconds Final  . INR 07/13/2015 1.90* 2.00 - 3.50 Final   Comment: INR is useful only to assess adequacy of anticoagulation with coumadin when comparing results from different labs. It should not be used to estimate bleeding risk or presence/abscense of coagulopathy in patients not on coumadin. Expected INR ranges for  nontherapeutic patients is 0.88 - 1.12.   Marland Kitchen Lovenox 07/13/2015 No   Final  . WBC 07/13/2015 7.7  3.9 - 10.3 10e3/uL Final  . NEUT# 07/13/2015 6.4  1.5 - 6.5 10e3/uL Final  . HGB 07/13/2015 7.6* 11.6 - 15.9 g/dL Final  . HCT 07/13/2015 24.1* 34.8 - 46.6 % Final  . Platelets 07/13/2015 251  145 - 400 10e3/uL Final  . MCV 07/13/2015 105.2* 79.5 - 101.0 fL Final  . MCH 07/13/2015 33.2  25.1 - 34.0 pg Final  . MCHC 07/13/2015 31.5  31.5 - 36.0 g/dL Final  . RBC 07/13/2015  2.29* 3.70 - 5.45 10e6/uL Final  . RDW 07/13/2015 24.0* 11.2 - 14.5 % Final  . lymph# 07/13/2015 0.7* 0.9 - 3.3 10e3/uL Final  . MONO# 07/13/2015 0.5  0.1 - 0.9 10e3/uL Final  . Eosinophils Absolute 07/13/2015 0.0  0.0 - 0.5 10e3/uL Final  . Basophils Absolute 07/13/2015  0.0  0.0 - 0.1 10e3/uL Final  . NEUT% 07/13/2015 83.9* 38.4 - 76.8 % Final  . LYMPH% 07/13/2015 9.0* 14.0 - 49.7 % Final  . MONO% 07/13/2015 6.6  0.0 - 14.0 % Final  . EOS% 07/13/2015 0.4  0.0 - 7.0 % Final  . BASO% 07/13/2015 0.1  0.0 - 2.0 % Final  . nRBC 07/13/2015 0  0 - 0 % Final    RADIOGRAPHIC STUDIES: Dg Chest 2 View  07/13/2015  CLINICAL DATA:  Fatigue, persistent cough and congestion, history of breast carcinoma EXAM: CHEST  2 VIEW COMPARISON:  Chest x-ray of 07/04/2015 and 12/24/2014 FINDINGS: No focal infiltrate or effusion is seen. A probable granuloma is noted in a lower lobe on the lateral view most likely within the right lower lobe posteriorly. Mediastinal and hilar contours are unremarkable. Cardiomegaly is stable. A left-sided Port-A-Cath remains in uncertain position coursing over the descending thoracic aorta. This catheter most likely is within a persistent left SVC when visualized on the lateral view. IMPRESSION: 1. Stable cardiomegaly.  No active lung disease. 2. No change in position of the left-sided Port-A-Cath as noted above. Electronically Signed   By: Ivar Drape M.D.   On: 07/13/2015 10:21    ASSESSMENT/PLAN:    Bronchitis Pt was dx'd with URI/bronchitis a few weeks ago.  Pt finished a a Zithromax pack last week; and states that all of her symptoms have essentially resolved at this time.  She denies any recent fevers or chills.  Long term current use of anticoagulant therapy Patient has a history of atrial fib; and continues to take Coumadin on a regular basis.  Her Coumadin therapeutic range is 2 -2.5.   Iron deficiency anemia due to chronic blood loss Patient has a history of chronic iron deficiency anemia secondary to chronic GI bleed.  She receives Feraheme infusions on a fairly regular basis.  Her last iron infusion was 07/04/2015.  Patient presented to the [facility] today with complaint of increased fatigue.  She denied any shortness of breath with  exertion.  Blood counts obtained today revealed a WBC of 7.7, ANC 6.4, hemoglobin has decreased from 9.3 down to 7.6, and platelet count is 251.  Patient will receive 2 units packed red blood cells for treatment of anemia.  Patient states that she has Lasix that she takes on a as needed basis for fluid overload.  Patient plans to take Lasix first thing in the morning tomorrow.  Will schedule patient for labs, type and hold, and visited with symptom management clinic on Monday, 07/17/2015.   Patient stated understanding of all instructions; and was in agreement with this plan of care. The patient knows to call the clinic with any problems, questions or concerns.   Total time spent with patient was 25 minutes;  with greater than 75 percent of that time spent in face to face counseling regarding patient's symptoms,  and coordination of care and follow up.  Disclaimer:This dictation was prepared with Dragon/digital dictation along with [employer]. Any transcriptional errors that result from this process are unintentional.  Drue Second, NP 07/16/2015

## 2015-07-16 NOTE — Assessment & Plan Note (Addendum)
Pt was dx'd with URI/bronchitis a few weeks ago.  Pt finished a a Zithromax pack last week; and states that all of her symptoms have essentially resolved at this time.  She denies any recent fevers or chills.

## 2015-07-16 NOTE — Assessment & Plan Note (Signed)
Patient has a history of atrial fib; and continues to take Coumadin on a regular basis.  Her Coumadin therapeutic range is 2 -2.5.

## 2015-07-16 NOTE — Assessment & Plan Note (Signed)
Patient has a history of chronic iron deficiency anemia secondary to chronic GI bleed.  She receives Feraheme infusions on a fairly regular basis.  Her last iron infusion was 07/04/2015.  Patient presented to the [facility] today with complaint of increased fatigue.  She denied any shortness of breath with exertion.  Blood counts obtained today revealed a WBC of 7.7, ANC 6.4, hemoglobin has decreased from 9.3 down to 7.6, and platelet count is 251.  Patient will receive 2 units packed red blood cells for treatment of anemia.  Patient states that she has Lasix that she takes on a as needed basis for fluid overload.  Patient plans to take Lasix first thing in the morning tomorrow.  Will schedule patient for labs, type and hold, and visited with symptom management clinic on Monday, 07/17/2015.

## 2015-07-17 ENCOUNTER — Other Ambulatory Visit: Payer: Self-pay | Admitting: Nurse Practitioner

## 2015-07-17 ENCOUNTER — Other Ambulatory Visit: Payer: Self-pay

## 2015-07-17 ENCOUNTER — Other Ambulatory Visit (HOSPITAL_BASED_OUTPATIENT_CLINIC_OR_DEPARTMENT_OTHER): Payer: Medicare Other

## 2015-07-17 ENCOUNTER — Telehealth: Payer: Self-pay | Admitting: Oncology

## 2015-07-17 ENCOUNTER — Encounter: Payer: Medicare Other | Admitting: Nurse Practitioner

## 2015-07-17 ENCOUNTER — Telehealth: Payer: Self-pay | Admitting: Nurse Practitioner

## 2015-07-17 ENCOUNTER — Ambulatory Visit (HOSPITAL_BASED_OUTPATIENT_CLINIC_OR_DEPARTMENT_OTHER): Payer: Medicare Other | Admitting: Oncology

## 2015-07-17 VITALS — BP 140/63 | HR 85 | Temp 97.4°F | Resp 18 | Ht 60.0 in | Wt 122.4 lb

## 2015-07-17 DIAGNOSIS — I481 Persistent atrial fibrillation: Secondary | ICD-10-CM

## 2015-07-17 DIAGNOSIS — Z7901 Long term (current) use of anticoagulants: Secondary | ICD-10-CM | POA: Diagnosis not present

## 2015-07-17 DIAGNOSIS — D500 Iron deficiency anemia secondary to blood loss (chronic): Secondary | ICD-10-CM | POA: Diagnosis present

## 2015-07-17 DIAGNOSIS — I4819 Other persistent atrial fibrillation: Secondary | ICD-10-CM

## 2015-07-17 DIAGNOSIS — K922 Gastrointestinal hemorrhage, unspecified: Secondary | ICD-10-CM

## 2015-07-17 DIAGNOSIS — D689 Coagulation defect, unspecified: Secondary | ICD-10-CM

## 2015-07-17 DIAGNOSIS — D62 Acute posthemorrhagic anemia: Secondary | ICD-10-CM

## 2015-07-17 DIAGNOSIS — K921 Melena: Secondary | ICD-10-CM

## 2015-07-17 LAB — COMPREHENSIVE METABOLIC PANEL
ALBUMIN: 3.4 g/dL — AB (ref 3.5–5.0)
ALT: 19 U/L (ref 0–55)
AST: 20 U/L (ref 5–34)
Alkaline Phosphatase: 52 U/L (ref 40–150)
Anion Gap: 8 mEq/L (ref 3–11)
BUN: 40 mg/dL — AB (ref 7.0–26.0)
CHLORIDE: 102 meq/L (ref 98–109)
CO2: 22 mEq/L (ref 22–29)
Calcium: 9.8 mg/dL (ref 8.4–10.4)
Creatinine: 1.3 mg/dL — ABNORMAL HIGH (ref 0.6–1.1)
EGFR: 38 mL/min/{1.73_m2} — ABNORMAL LOW (ref >90)
GLUCOSE: 91 mg/dL (ref 70–140)
POTASSIUM: 4.6 meq/L (ref 3.5–5.1)
SODIUM: 133 meq/L — AB (ref 136–145)
Total Bilirubin: 0.35 mg/dL (ref 0.20–1.20)
Total Protein: 6.2 g/dL — ABNORMAL LOW (ref 6.4–8.3)

## 2015-07-17 LAB — CBC WITH DIFFERENTIAL/PLATELET
BASO%: 0.3 % (ref 0.0–2.0)
BASOS ABS: 0 10*3/uL (ref 0.0–0.1)
EOS%: 0.7 % (ref 0.0–7.0)
Eosinophils Absolute: 0 10*3/uL (ref 0.0–0.5)
HCT: 32.1 % — ABNORMAL LOW (ref 34.8–46.6)
HEMOGLOBIN: 10.3 g/dL — AB (ref 11.6–15.9)
LYMPH%: 7.1 % — ABNORMAL LOW (ref 14.0–49.7)
MCH: 31.5 pg (ref 25.1–34.0)
MCHC: 32.2 g/dL (ref 31.5–36.0)
MCV: 98 fL (ref 79.5–101.0)
MONO#: 0.3 10*3/uL (ref 0.1–0.9)
MONO%: 6.7 % (ref 0.0–14.0)
NEUT#: 4.3 10*3/uL (ref 1.5–6.5)
NEUT%: 85.2 % — ABNORMAL HIGH (ref 38.4–76.8)
Platelets: 204 10*3/uL (ref 145–400)
RBC: 3.27 10*6/uL — ABNORMAL LOW (ref 3.70–5.45)
RDW: 24.4 % — AB (ref 11.2–14.5)
WBC: 5.1 10*3/uL (ref 3.9–10.3)
lymph#: 0.4 10*3/uL — ABNORMAL LOW (ref 0.9–3.3)

## 2015-07-17 LAB — PROTIME-INR
INR: 2.1 (ref 2.00–3.50)
Protime: 25.2 Seconds — ABNORMAL HIGH (ref 10.6–13.4)

## 2015-07-17 NOTE — Telephone Encounter (Signed)
home phone busy...lvm on mobile with todays appt

## 2015-07-17 NOTE — Telephone Encounter (Signed)
Spoke with patient to confirm appt change on 4/20. Pt to do labs only and gave her 1 year fu appt date/time April 2018

## 2015-07-17 NOTE — Progress Notes (Signed)
ID: Deborah [name]   DOB: 17-Dec-1926  MR#: [id number]  CVE#:[id number]  PCP: Mathews Argyle, MD GYN:  SU:  OTHER MD: Clarene Essex, Rema Jasmine   HISTORY OF PRESENT ILLNESS: From the earlier summary:  Deborah [name]") Deborah [name] is an 82 year old [place] woman referred April 2014  by Dr. Watt Climes for evaluation and treatment of iron deficiency anemia. The patient has a documented history of chronic GI bleeding, with prior GI evaluation showing a hiatal hernia, multiple gastric polyps, and diverticular disease. She has been treated with various iron preparations, most recently with Niferex, with poor tolerance. Despite everyone's best efforts her hemoglobin has continued to drop, from 12.4 on 06/03/2012 to10.6 on 07/01/2012 and 9.6.  The GI-bleeding problem is complicated by a history of AVR and A-fib, on chronic anticoagulation. She had what appears to have been a TIA September 2016.  Her subsequent history is as detailed below.  INTERVAL HISTORY: Deborah [name] today for an unscheduled visit. She was transfused 07/13/2015 and that went well. Today however she has felt very tense and shaky. She says she's had trouble holding onto things like pencils. She has felt weak in her arms and legs. Importantly, since her last visit here, her son Deborah [name] who lives in [place] had a stroke. This was witnessed and luckily he responded beautifully to TPA. Deborah [name] tells me there have been no residuals and he is back home.  She thinks that she has been somewhat tight because of medicine and she does have degenerative disc disease in her neck and she thinks may be that's what's going on. She just wanted to be checked out  REVIEW OF SYSTEMS:  she tells me for the first time in months she saw a little bit brown in her stools. Otherwise they're usually black. She is a little bit on the constipated side. She gets leg cramps at night usually about 2 in the morning. "I eat mustard every night".  She did  have a very nice Easter with her family, who've gathered at her house, and is planning to go to the beach with family this weekend if labs look good.Aside from these issues a detailed review of systems today was noncontributory  PAST MEDICAL HISTORY: Past Medical History  Diagnosis Date  . Hypothyroidism   . Stomach problems     seeing Dr Watt Climes  . Hypertension   . GERD (gastroesophageal reflux disease)   . Anemia   . Arrhythmia     Atrial Fibrillation  . Breast cancer ([facility])     "right; S/P lumpectomy, chemo, XRT"   . Complication of anesthesia     "once I'm awakened, I don't sleep til the following night"  . High cholesterol   . Heart murmur   . History of blood transfusion 1996; ~ 2013    "related to valve replacement; GI bleeding"   . Chronic lower GI bleeding   . Arthritis     "fingers, toes, hips; qwhere" (05/19/2013)  . Gout   . Pneumonia 07/2011  . Rectus sheath hematoma 12/19/14     held anticoagulation for 1 week, transfused  . TIA (transient ischemic attack) 12/24/14    symptoms resolved  . History of nuclear stress test     Myoview 1/17: EF 59%, normal perfusion, low risk  . History of echocardiogram     Echo 1/17: mild LVH, EF 50-55%, no RWMA, mild AI, mechanical AVR with mean 21 mmHg/peak 42 mmHg (stable since 2012), mod MR, mod to  severe BAE, PASP 36 mmHg    PAST SURGICAL HISTORY: Past Surgical History  Procedure Laterality Date  . Arteriogram  01/12    NO RAS   . Cardioversion      X 2  . Cardiac catheterization    . Cataract extraction w/ intraocular lens  implant, bilateral Bilateral     bilateral cataract with lens implants  . Cholecystectomy    . Abdominal hysterectomy    . Appendectomy    . Colonoscopy with propofol N/A 11/24/2012    Procedure: COLONOSCOPY WITH PROPOFOL;  Surgeon: Jeryl Columbia, MD;  Location: [facility];  Service: Endoscopy;  Laterality: N/A;  . Breast biopsy Right   . Breast lumpectomy Right   . Aortic valve replacement  1996     [employer]    FAMILY HISTORY Family History  Problem Relation Age of Onset  . Dementia Mother   . Coronary artery disease Father   . Breast cancer Daughter   . Colon cancer Sister   . Liver cancer Sister   . Lung cancer Brother   . COPD Brother    the patient's father died from a myocardial infarction at the age of 47. The patient's mother died with Alzheimer's disease at the age of 48. She had 2 brothers and one sister. Her sister had colon cancer diagnosed at age 62. Otherwise the only other person with cancer in the family was the patient's mother's twin sister, diagnosed with breast cancer at age 56.  GYNECOLOGIC HISTORY: Menarche age 38, first live birth age 65, she is [place] P3. Underwent menopause 1972. She took hormone replacement for approximately 21 years.  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 Her daughter Deborah [name] runs the [employer] here. Daughter Deborah [name] also lives in [place], is a homemaker. Son Deborah [name] is a Scientist, research (medical). The patient has of 10 grandchildren She attends a [employer].    ADVANCED DIRECTIVES: Living will in place. Her daughter Deborah [name] is her healthcare power of attorney.  HEALTH MAINTENANCE: Social History  Substance Use Topics  . Smoking status: Former Smoker -- 1.00 packs/day for 20 years    Types: Cigarettes    Quit date: 04/01/1988  . Smokeless tobacco: Never Used  . Alcohol Use: 4.8 oz/week    4 Glasses of wine, 4 Shots of liquor per week     Comment: burbon or glass wine daily     Allergies  Allergen Reactions  . Amiodarone Shortness Of Breath  . Alendronate Sodium Other (See Comments)    GI upset  . Macrodantin [Nitrofurantoin] Other (See Comments)    GI upset  . Meclizine Swelling    tongue  . Norvasc [Amlodipine Besylate] Swelling    edema  . Tussionex Pennkinetic Er [Hydrocod Polst-Cpm Polst Er] Other (See Comments)    Reaction unknown     Current Outpatient Prescriptions  Medication Sig Dispense Refill  . acetaminophen (TYLENOL) 500 MG tablet Take 1,000 mg by mouth every 6 (six) hours as needed for headache.    . allopurinol (ZYLOPRIM) 300 MG tablet Take 300 mg by mouth daily.    Marland Kitchen amLODipine (NORVASC) 5 MG tablet Take 5 mg by mouth daily.     . calcium-vitamin D 500 MG tablet Take 1 tablet by mouth 2 (two) times daily.      . celecoxib (CELEBREX) 200 MG capsule Take 200 mg by mouth. Every 2-3 days  4  . cloNIDine (CATAPRES)  0.1 MG tablet Take 0.2 mg by mouth 2 (two) times daily.    Marland Kitchen dicyclomine (BENTYL) 10 MG capsule Take 10 mg by mouth daily as needed for spasms.     . digoxin (LANOXIN) 0.125 MG tablet Take 0.125 mg by mouth daily.    . fexofenadine (ALLEGRA ALLERGY) 180 MG tablet Take 180 mg by mouth daily as needed for allergies.     . hydrochlorothiazide (HYDRODIURIL) 25 MG tablet Take 1 tablet (25 mg total) by mouth every evening. 90 tablet 3  . levofloxacin (LEVAQUIN) 500 MG tablet Take 1 tablet (500 mg total) by mouth daily. 10 tablet 0  . levothyroxine (SYNTHROID, LEVOTHROID) 88 MCG tablet Take 88 mcg by mouth daily before breakfast.    . lidocaine-prilocaine (EMLA) cream Apply 1 application topically as needed (prior to infusion at porta cath site). 30 g 1  . losartan (COZAAR) 100 MG tablet Take 1 tablet (100 mg total) by mouth every evening. 90 tablet 3  . multivitamin (THERAGRAN) per tablet Take 1 tablet by mouth daily.     Marland Kitchen NITROSTAT 0.4 MG SL tablet Place 0.4 mg under the tongue every 5 (five) minutes as needed for chest pain (x 3 doses).   1  . Omega-3 Fatty Acids (FISH OIL) 1000 MG CAPS Take 1,000 mg by mouth 2 (two) times daily.     . simvastatin (ZOCOR) 20 MG tablet TAKE ONE-HALF TABLET DAILY 30 tablet 3  . spironolactone (ALDACTONE) 25 MG tablet Take 25 mg by mouth 2 (two) times daily.  5  . warfarin (COUMADIN) 5 MG tablet TAKE ONE TABLET BY MOUTH ONCE DAILY 30 tablet 3  . zolpidem (AMBIEN CR) 12.5 MG  CR tablet Take 12.5 mg by mouth at bedtime as needed for sleep.     No current facility-administered medications for this visit.    OBJECTIVE: Elderly white woman  In no acute distress  Filed Vitals:   07/17/15 1133  BP: 140/63  Pulse: 85  Temp: 97.4 F (36.3 C)  Resp: 18     Body mass index is 23.9 kg/(m^2).    ECOG FS: 1  Sclerae unicteric, EOMs intact, pupils [name] and equal Oropharynx clear, no thrush or other lesions No cervical or supraclavicular adenopathy Lungs no rales or rhonchi Heartirregular rhythm as previously noted, murmur as previously noted Abd soft, nontender, positive bowel sounds, no masses palpated MSK mild kyphosis but no focal spinal tenderness, normal straight leg raising bilaterally Neuro: entirely nonfocal and specifically 5 over 5 all motor areas tested, well oriented, appropriate affect Breasts: deferred    Lab Results  Component Value Date   WBC 5.1 07/17/2015   NEUTROABS 4.3 07/17/2015   HGB 10.3* 07/17/2015   HCT 32.1* 07/17/2015   MCV 98.0 07/17/2015   PLT 204 07/17/2015      Chemistry      Component Value Date/Time   NA 132* 06/02/2015 0919   NA 133* 12/24/2014 2239   K 5.3* 06/02/2015 0919   K 4.2 12/24/2014 2239   CL 97* 12/24/2014 2239   CO2 21* 06/02/2015 0919   CO2 27 12/24/2014 2200   BUN 65.4* 06/02/2015 0919   BUN 33* 12/24/2014 2239   CREATININE 1.5* 06/02/2015 0919   CREATININE 0.90 12/24/2014 2239      Component Value Date/Time   CALCIUM 9.0 06/02/2015 0919   CALCIUM 9.8 12/24/2014 2200   ALKPHOS 50 06/02/2015 0919   ALKPHOS 65 12/24/2014 2200   AST 18 06/02/2015 0919   AST 46*  12/24/2014 2200   ALT 20 06/02/2015 0919   ALT 28 12/24/2014 2200   BILITOT <0.30 06/02/2015 0919   BILITOT 1.3* 12/24/2014 2200      No results found for: LABCA2  No components found for: LABCA125   Recent Labs Lab 07/17/15 1109  INR 2.10    Urinalysis    Component Value Date/Time   COLORURINE YELLOW 12/17/2014 1805    APPEARANCEUR CLOUDY* 12/17/2014 1805   LABSPEC 1.010 03/22/2015 0917   LABSPEC 1.010 12/17/2014 1805   PHURINE 6.0 03/22/2015 0917   PHURINE 6.0 12/17/2014 1805   GLUCOSEU Negative 03/22/2015 0917   GLUCOSEU NEGATIVE 12/17/2014 1805   GLUCOSEU NEGATIVE 04/07/2013 1123   HGBUR Negative 03/22/2015 0917   HGBUR TRACE* 12/17/2014 1805   BILIRUBINUR Negative 03/22/2015 [place] 12/17/2014 1805   KETONESUR Negative 03/22/2015 0917   KETONESUR 15* 12/17/2014 1805   PROTEINUR Negative 03/22/2015 0917   PROTEINUR 100* 12/17/2014 1805   UROBILINOGEN 0.2 03/22/2015 0917   UROBILINOGEN 0.2 12/17/2014 1805   NITRITE Negative 03/22/2015 0917   NITRITE NEGATIVE 12/17/2014 1805   LEUKOCYTESUR Negative 03/22/2015 0917   LEUKOCYTESUR TRACE* 12/17/2014 1805   STUDIES: Dg Chest 2 View  07/13/2015  CLINICAL DATA:  Fatigue, persistent cough and congestion, history of breast carcinoma EXAM: CHEST  2 VIEW COMPARISON:  Chest x-ray of 07/04/2015 and 12/24/2014 FINDINGS: No focal infiltrate or effusion is seen. A probable granuloma is noted in a lower lobe on the lateral view most likely within the right lower lobe posteriorly. Mediastinal and hilar contours are unremarkable. Cardiomegaly is stable. A left-sided Port-A-Cath remains in uncertain position coursing over the descending thoracic aorta. This catheter most likely is within a persistent left SVC when visualized on the lateral view. IMPRESSION: 1. Stable cardiomegaly.  No active lung disease. 2. No change in position of the left-sided Port-A-Cath as noted above. Electronically Signed   By: Ivar Drape M.D.   On: 07/13/2015 10:21   Dg Chest 2 View  07/04/2015  CLINICAL DATA:  Chronic atrial fibrillation. Anticoagulants history. Fifteen . shortness of breath. Cough. EXAM: CHEST  2 VIEW COMPARISON:  12/24/2014. FINDINGS: Prior port catheter with the tip projected over the left side of the mediastinum is again noted. This is most likely a  persistent left-sided superior vena cava. Prior CABG. Stable cardiomegaly. Mild ill-defined infiltrate right upper lobe. Close follow-up chest x-rays to demonstrate clearing suggested to exclude underlying nodule or mass lesion. No pleural effusion or pneumothorax. Surgical clips right axilla. Degenerative changes osteopenia thoracic spine . IMPRESSION: 1. Mild infiltrate right upper lobe. Close follow-up chest x-rays are suggested to demonstrate clearing to exclude underlying nodule or mass lesion. Followup PA and lateral chest X-ray is recommended in 3-4 weeks following trial of antibiotic therapy to ensure resolution and exclude underlying malignancy. 2. PowerPort catheter in stable position as above. Prior CABG. Stable cardiomegaly. Electronically Signed   By: [name]  Register   On: 07/04/2015 11:32     ASSESSMENT: 82 y.o. [place] with iron deficiency anemia secondary to chronic blood loss, s/p oral iron supplementation with poor tolerance and inadequate response  (1) Feraheme started 07/24/2012, repeated whenever ferritin drops <100, mosr recent dose 07/04/2015  (2) transfusing PBBCs for Hb < 9.0 or symptoms  (3) chronic GIB extensively evaluated by GI with findings including AVM (cauterized), diverticular disease, hemorrhoids and gastric polyps  (4) on lifelong anticoagulation for AFib and mechanical aortic valve  (a) on warfarin until June 2016, when she was  switched to Lovenox  (b) Lovenox discontinued September 2016 after rectus sheath hematoma developed  (c) warfarin resumed 12/27/2014 goal being an INR between 2.0 and 2.5   PLAN: Deborah [name] I think is anxious because of her son situation, although hopefully things will continue to go well. Her neuro exam is unremarkable. Her last R quite good for her.  As far as her leg cramps is concerned he is welcome to use mustard but she can also consider taking a little bit of tonic water at bedtime. That might be helpful in that it would  hydrate her and also provide her with a little bit of quinine.  She will have lab work this Thursday and every 2 weeks indefinitely. We will continue to monitor her INR, hemoglobin, and ferritin in terms of visits she will have a visit with me in a year but of course we will work her in as needed.  She knows to call for any problems that may develop before her return visit here.  Deborah [name] C    07/17/2015

## 2015-07-20 ENCOUNTER — Ambulatory Visit: Payer: Medicare Other

## 2015-07-20 ENCOUNTER — Other Ambulatory Visit: Payer: Self-pay | Admitting: Oncology

## 2015-07-20 ENCOUNTER — Other Ambulatory Visit: Payer: Self-pay

## 2015-07-20 ENCOUNTER — Ambulatory Visit (HOSPITAL_BASED_OUTPATIENT_CLINIC_OR_DEPARTMENT_OTHER): Payer: Medicare Other

## 2015-07-20 ENCOUNTER — Other Ambulatory Visit (HOSPITAL_BASED_OUTPATIENT_CLINIC_OR_DEPARTMENT_OTHER): Payer: Medicare Other

## 2015-07-20 ENCOUNTER — Encounter: Payer: Medicare Other | Admitting: Oncology

## 2015-07-20 VITALS — BP 132/63 | HR 64 | Temp 97.0°F | Resp 16

## 2015-07-20 DIAGNOSIS — D62 Acute posthemorrhagic anemia: Secondary | ICD-10-CM

## 2015-07-20 DIAGNOSIS — D500 Iron deficiency anemia secondary to blood loss (chronic): Secondary | ICD-10-CM

## 2015-07-20 DIAGNOSIS — I4819 Other persistent atrial fibrillation: Secondary | ICD-10-CM

## 2015-07-20 DIAGNOSIS — K922 Gastrointestinal hemorrhage, unspecified: Secondary | ICD-10-CM

## 2015-07-20 DIAGNOSIS — I481 Persistent atrial fibrillation: Secondary | ICD-10-CM

## 2015-07-20 DIAGNOSIS — D689 Coagulation defect, unspecified: Secondary | ICD-10-CM

## 2015-07-20 DIAGNOSIS — Z95828 Presence of other vascular implants and grafts: Secondary | ICD-10-CM

## 2015-07-20 DIAGNOSIS — K921 Melena: Secondary | ICD-10-CM

## 2015-07-20 LAB — CBC WITH DIFFERENTIAL/PLATELET
BASO%: 0.2 % (ref 0.0–2.0)
Basophils Absolute: 0 10*3/uL (ref 0.0–0.1)
EOS%: 0.9 % (ref 0.0–7.0)
Eosinophils Absolute: 0 10*3/uL (ref 0.0–0.5)
HCT: 27 % — ABNORMAL LOW (ref 34.8–46.6)
HEMOGLOBIN: 8.7 g/dL — AB (ref 11.6–15.9)
LYMPH#: 0.5 10*3/uL — AB (ref 0.9–3.3)
LYMPH%: 9.7 % — ABNORMAL LOW (ref 14.0–49.7)
MCH: 31.3 pg (ref 25.1–34.0)
MCHC: 32.2 g/dL (ref 31.5–36.0)
MCV: 97.1 fL (ref 79.5–101.0)
MONO#: 0.4 10*3/uL (ref 0.1–0.9)
MONO%: 8.2 % (ref 0.0–14.0)
NEUT#: 3.8 10*3/uL (ref 1.5–6.5)
NEUT%: 81 % — AB (ref 38.4–76.8)
NRBC: 0 % (ref 0–0)
Platelets: 212 10*3/uL (ref 145–400)
RBC: 2.78 10*6/uL — ABNORMAL LOW (ref 3.70–5.45)
RDW: 20.9 % — AB (ref 11.2–14.5)
WBC: 4.6 10*3/uL (ref 3.9–10.3)

## 2015-07-20 LAB — PROTIME-INR
INR: 1.9 — AB (ref 2.00–3.50)
PROTIME: 22.8 s — AB (ref 10.6–13.4)

## 2015-07-20 LAB — PREPARE RBC (CROSSMATCH)

## 2015-07-20 MED ORDER — ACETAMINOPHEN 325 MG PO TABS
ORAL_TABLET | ORAL | Status: AC
  Filled 2015-07-20: qty 2

## 2015-07-20 MED ORDER — SODIUM CHLORIDE 0.9% FLUSH
10.0000 mL | INTRAVENOUS | Status: DC | PRN
  Administered 2015-07-20: 10 mL via INTRAVENOUS
  Filled 2015-07-20: qty 10

## 2015-07-20 MED ORDER — DIPHENHYDRAMINE HCL 25 MG PO CAPS
ORAL_CAPSULE | ORAL | Status: AC
  Filled 2015-07-20: qty 1

## 2015-07-20 MED ORDER — DIPHENHYDRAMINE HCL 25 MG PO CAPS
25.0000 mg | ORAL_CAPSULE | Freq: Once | ORAL | Status: AC
  Administered 2015-07-20: 25 mg via ORAL

## 2015-07-20 MED ORDER — HEPARIN SOD (PORK) LOCK FLUSH 100 UNIT/ML IV SOLN
500.0000 [IU] | Freq: Every day | INTRAVENOUS | Status: AC | PRN
  Administered 2015-07-20: 500 [IU]
  Filled 2015-07-20: qty 5

## 2015-07-20 MED ORDER — SODIUM CHLORIDE 0.9% FLUSH
10.0000 mL | INTRAVENOUS | Status: AC | PRN
  Administered 2015-07-20: 10 mL
  Filled 2015-07-20: qty 10

## 2015-07-20 MED ORDER — ACETAMINOPHEN 325 MG PO TABS
650.0000 mg | ORAL_TABLET | Freq: Once | ORAL | Status: AC
  Administered 2015-07-20: 650 mg via ORAL

## 2015-07-20 MED ORDER — SODIUM CHLORIDE 0.9 % IV SOLN
250.0000 mL | Freq: Once | INTRAVENOUS | Status: AC
  Administered 2015-07-20: 250 mL via INTRAVENOUS

## 2015-07-20 NOTE — Patient Instructions (Signed)

## 2015-07-20 NOTE — Patient Instructions (Signed)

## 2015-07-21 ENCOUNTER — Telehealth: Payer: Self-pay

## 2015-07-21 ENCOUNTER — Telehealth: Payer: Self-pay | Admitting: Oncology

## 2015-07-21 ENCOUNTER — Other Ambulatory Visit: Payer: Self-pay | Admitting: Oncology

## 2015-07-21 ENCOUNTER — Other Ambulatory Visit: Payer: Self-pay

## 2015-07-21 ENCOUNTER — Inpatient Hospital Stay (HOSPITAL_COMMUNITY)
Admission: RE | Admit: 2015-07-21 | Discharge: 2015-07-21 | Disposition: A | Discharge status: Home / Self Care | Payer: Medicare Other | Source: Ambulatory Visit

## 2015-07-21 DIAGNOSIS — D500 Iron deficiency anemia secondary to blood loss (chronic): Secondary | ICD-10-CM

## 2015-07-21 LAB — TYPE AND SCREEN
ABO/RH(D): O POS
ANTIBODY SCREEN: NEGATIVE
UNIT DIVISION: 0
Unit division: 0

## 2015-07-21 NOTE — Telephone Encounter (Signed)
lmom to inform pt of lab appt 4/25 at 1030 am

## 2015-07-21 NOTE — Progress Notes (Signed)
Spoke with Zigmund Daniel, Dr. Virgie Dad nurse.  Advised patient didn't show for 0900 appointment for blood transfusion.  Zigmund Daniel advised that they were able to give patient both units of blood yesterday at the cancer center.

## 2015-07-21 NOTE — Telephone Encounter (Signed)
Received call Barb from [facility] stating that patient will be having an endoscopy on Tuesday 07/25/15 at 1:00.  Per Dr. Jana Hakim patient is to stop her coumadin after Friday nights dose.  Patient will come in on Tuesday morning for an INR and CBC.  Writer instructed patient not to eat or drink anything from midnight on Monday night.  Patient stated understanding.

## 2015-07-24 ENCOUNTER — Other Ambulatory Visit: Payer: Self-pay | Admitting: Gastroenterology

## 2015-07-24 NOTE — Progress Notes (Signed)
I was unable to reach patient by phone.  I left  A message on voice mail.  I instructed the patient to arrive at [facility] entrance at 1145   , nothing to eat or drink after midnight.   I instructed the patient to not take any medications, since we do not have a confirmed medication list.  I asked patient to not wear any lotions, powders, cologne, jewelry, piercing, make-up or nail polish.  I asked the patient to call [phone], in the am if there were any questions or problems.

## 2015-07-25 ENCOUNTER — Other Ambulatory Visit (HOSPITAL_BASED_OUTPATIENT_CLINIC_OR_DEPARTMENT_OTHER): Payer: Medicare Other

## 2015-07-25 ENCOUNTER — Other Ambulatory Visit: Payer: Self-pay | Admitting: Oncology

## 2015-07-25 ENCOUNTER — Telehealth: Payer: Self-pay | Admitting: Oncology

## 2015-07-25 ENCOUNTER — Encounter (HOSPITAL_COMMUNITY): Payer: Self-pay | Admitting: *Deleted

## 2015-07-25 ENCOUNTER — Ambulatory Visit (HOSPITAL_COMMUNITY)
Admission: RE | Admit: 2015-07-25 | Discharge: 2015-07-25 | Disposition: A | Discharge status: Home / Self Care | Payer: Medicare Other | Source: Ambulatory Visit | Attending: Gastroenterology | Admitting: Gastroenterology

## 2015-07-25 ENCOUNTER — Encounter (HOSPITAL_COMMUNITY)
Admission: RE | Disposition: A | Discharge status: Home / Self Care | Payer: Self-pay | Source: Ambulatory Visit | Attending: Gastroenterology

## 2015-07-25 ENCOUNTER — Ambulatory Visit (HOSPITAL_COMMUNITY): Payer: Medicare Other | Admitting: Anesthesiology

## 2015-07-25 DIAGNOSIS — Z952 Presence of prosthetic heart valve: Secondary | ICD-10-CM | POA: Diagnosis not present

## 2015-07-25 DIAGNOSIS — K31811 Angiodysplasia of stomach and duodenum with bleeding: Secondary | ICD-10-CM | POA: Insufficient documentation

## 2015-07-25 DIAGNOSIS — D500 Iron deficiency anemia secondary to blood loss (chronic): Secondary | ICD-10-CM | POA: Diagnosis not present

## 2015-07-25 DIAGNOSIS — Z853 Personal history of malignant neoplasm of breast: Secondary | ICD-10-CM | POA: Diagnosis not present

## 2015-07-25 DIAGNOSIS — I4819 Other persistent atrial fibrillation: Secondary | ICD-10-CM

## 2015-07-25 DIAGNOSIS — K449 Diaphragmatic hernia without obstruction or gangrene: Secondary | ICD-10-CM | POA: Insufficient documentation

## 2015-07-25 DIAGNOSIS — I481 Persistent atrial fibrillation: Secondary | ICD-10-CM | POA: Diagnosis not present

## 2015-07-25 DIAGNOSIS — I10 Essential (primary) hypertension: Secondary | ICD-10-CM | POA: Insufficient documentation

## 2015-07-25 DIAGNOSIS — D62 Acute posthemorrhagic anemia: Secondary | ICD-10-CM

## 2015-07-25 DIAGNOSIS — K5521 Angiodysplasia of colon with hemorrhage: Secondary | ICD-10-CM | POA: Diagnosis not present

## 2015-07-25 DIAGNOSIS — I4891 Unspecified atrial fibrillation: Secondary | ICD-10-CM | POA: Insufficient documentation

## 2015-07-25 DIAGNOSIS — Z79899 Other long term (current) drug therapy: Secondary | ICD-10-CM | POA: Insufficient documentation

## 2015-07-25 DIAGNOSIS — E039 Hypothyroidism, unspecified: Secondary | ICD-10-CM | POA: Insufficient documentation

## 2015-07-25 DIAGNOSIS — K921 Melena: Secondary | ICD-10-CM

## 2015-07-25 DIAGNOSIS — Z87891 Personal history of nicotine dependence: Secondary | ICD-10-CM | POA: Diagnosis not present

## 2015-07-25 DIAGNOSIS — I6930 Unspecified sequelae of cerebral infarction: Secondary | ICD-10-CM | POA: Diagnosis not present

## 2015-07-25 DIAGNOSIS — D689 Coagulation defect, unspecified: Secondary | ICD-10-CM

## 2015-07-25 HISTORY — PX: HOT HEMOSTASIS: SHX5433

## 2015-07-25 HISTORY — PX: ESOPHAGOGASTRODUODENOSCOPY (EGD) WITH PROPOFOL: SHX5813

## 2015-07-25 LAB — PROTIME-INR
INR: 1.5 — AB (ref 2.00–3.50)
Protime: 18 Seconds — ABNORMAL HIGH (ref 10.6–13.4)

## 2015-07-25 LAB — CBC WITH DIFFERENTIAL/PLATELET
BASO%: 0.5 % (ref 0.0–2.0)
Basophils Absolute: 0 10*3/uL (ref 0.0–0.1)
EOS%: 1 % (ref 0.0–7.0)
Eosinophils Absolute: 0 10*3/uL (ref 0.0–0.5)
HEMATOCRIT: 36.3 % (ref 34.8–46.6)
HGB: 11.6 g/dL (ref 11.6–15.9)
LYMPH%: 12.3 % — AB (ref 14.0–49.7)
MCH: 30.5 pg (ref 25.1–34.0)
MCHC: 32 g/dL (ref 31.5–36.0)
MCV: 95.5 fL (ref 79.5–101.0)
MONO#: 0.2 10*3/uL (ref 0.1–0.9)
MONO%: 6 % (ref 0.0–14.0)
NEUT%: 80.2 % — ABNORMAL HIGH (ref 38.4–76.8)
NEUTROS ABS: 3.2 10*3/uL (ref 1.5–6.5)
Platelets: 188 10*3/uL (ref 145–400)
RBC: 3.8 10*6/uL (ref 3.70–5.45)
RDW: 19 % — ABNORMAL HIGH (ref 11.2–14.5)
WBC: 4 10*3/uL (ref 3.9–10.3)
lymph#: 0.5 10*3/uL — ABNORMAL LOW (ref 0.9–3.3)
nRBC: 0 % (ref 0–0)

## 2015-07-25 SURGERY — ESOPHAGOGASTRODUODENOSCOPY (EGD) WITH PROPOFOL
Anesthesia: Monitor Anesthesia Care

## 2015-07-25 MED ORDER — PROPOFOL 10 MG/ML IV BOLUS
INTRAVENOUS | Status: DC | PRN
  Administered 2015-07-25: 10 mg via INTRAVENOUS
  Administered 2015-07-25 (×2): 20 mg via INTRAVENOUS

## 2015-07-25 MED ORDER — LACTATED RINGERS IV SOLN
INTRAVENOUS | Status: DC
  Administered 2015-07-25 (×2): via INTRAVENOUS

## 2015-07-25 MED ORDER — ONDANSETRON HCL 4 MG/2ML IJ SOLN
INTRAMUSCULAR | Status: DC | PRN
  Administered 2015-07-25: 4 mg via INTRAVENOUS

## 2015-07-25 MED ORDER — PROPOFOL 500 MG/50ML IV EMUL
INTRAVENOUS | Status: DC | PRN
  Administered 2015-07-25: 40 ug/kg/min via INTRAVENOUS

## 2015-07-25 MED ORDER — SODIUM CHLORIDE 0.9 % IV SOLN: INTRAVENOUS | Status: DC

## 2015-07-25 NOTE — Progress Notes (Unsigned)
Deborah [name] came back today for her final lab check before her procedure today. She has been off Coumadin for the last 2 days. Her INR is 1.5. I think this is very favorable.  I suggested she resume Coumadin tomorrow morning at 5 mg and also start her heparin. She has this shots are ready at home. She would repeat that on Thursday and then she will return here in Friday for labs. Hopefully we can stop the heparin at that time.

## 2015-07-25 NOTE — Anesthesia Postprocedure Evaluation (Signed)
Anesthesia Post Note  Patient: SANAIA JASSO  Procedure(s) Performed: Procedure(s) (LRB): ESOPHAGOGASTRODUODENOSCOPY (EGD) WITH PROPOFOL (N/A) HOT HEMOSTASIS (ARGON PLASMA COAGULATION/BICAP) (N/A)  Patient location during evaluation: PACU Anesthesia Type: MAC Level of consciousness: awake and alert Pain management: pain level controlled Vital Signs Assessment: post-procedure vital signs reviewed and stable Respiratory status: spontaneous breathing, nonlabored ventilation and respiratory function stable Cardiovascular status: blood pressure returned to baseline and stable Postop Assessment: no signs of nausea or vomiting Anesthetic complications: no    Last Vitals:  Filed Vitals:   07/25/15 1440 07/25/15 1441  BP: 185/82 185/82  Pulse: 94 94  Temp:    Resp: 21 21    Last Pain: There were no vitals filed for this visit.               Ellyanna Holton A.

## 2015-07-25 NOTE — Op Note (Signed)
[facility] Patient Name: Deborah [name] Procedure Date : 07/25/2015 MRN: [id number] Attending MD: Clarene Essex , MD Date of Birth: 1927/03/27 CSN: [id number] Age: 82 Admit Type: Outpatient Procedure:                Upper GI endoscopy Indications:              Iron deficiency anemia secondary to chronic blood                            loss positive capsule endoscopyfor oozing and AVMs                            in the proximal small bowel Providers:                Clarene Essex, MD, Dortha Schwalbe, RN, Elspeth Cho, Technician Referring MD:              Medicines:                Propofol total dose 180 mg IV,50 mg of lidocaine Complications:            No immediate complications. Estimated Blood Loss:     Estimated blood loss was minimal. Procedure:                Pre-Anesthesia Assessment:                           - Prior to the procedure, a History and Physical                            was performed, and patient medications and                            allergies were reviewed. The patient's tolerance of                            previous anesthesia was also reviewed. The risks                            and benefits of the procedure and the sedation                            options and risks were discussed with the patient.                            All questions were answered, and informed consent                            was obtained. Prior Anticoagulants: The patient has                            taken Coumadin (warfarin), last dose was 2 days  prior to procedure. ASA Grade Assessment: II - A                            patient with mild systemic disease. After reviewing                            the risks and benefits, the patient was deemed in                            satisfactory condition to undergo the procedure.                           After obtaining informed consent, the endoscope was                         passed under direct vision. Throughout the                            procedure, the patient's blood pressure, pulse, and                            oxygen saturations were monitored continuously. The                            [id number] ([id number]) ultraslim colonoscope was                            introduced through the mouth, and advanced to the                            mid-jejunum x 2. The upper GI endoscopy was                            accomplished without difficulty. The patient                            tolerated the procedure well. Scope In: Scope Out: Findings:      A small hiatal hernia was present.      The entire examined stomach was normal.      A few small angiodysplastic lesions with stigmata of recent bleeding       were found in the third portion of the duodenum and in the fourth       portion of the duodenum. Fulguration to ablate the lesion by argon       plasma at 0.5 liters/minute and 20 watts was successful.      A few small angiodysplastic lesions with bleeding on contact were found       in the jejunum. Fulguration to ablate the lesion by argon plasma at 0.5       liters/minute and 20 watts was successful.      The exam was otherwise without abnormality. Impression:               - Small hiatal hernia.                           -  Normal stomach.                           - A few recently bleeding angiodysplastic lesions                            in the duodenum. Treated with argon plasma                            coagulation (APC).                           - A few angiodysplastic lesions in the jejunum.                            Treated with argon plasma coagulation (APC). a                            total of 7 AVMs were treated                           - The examination was otherwise normal.                           - No specimens collected. Moderate Sedation:      moderate sedation-none Recommendation:           - Patient has  a contact number available for                            emergencies. The signs and symptoms of potential                            delayed complications were discussed with the                            patient. Return to normal activities tomorrow.                            Written discharge instructions were provided to the                            patient.                           - Soft diet today.                           - Continue present medications.                           - Return to GI clinic PRN.                           - Telephone GI clinic if symptomatic PRN. Procedure Code(s):        --- Professional ---  43255, Esophagogastroduodenoscopy, flexible,                            transoral; with control of bleeding, any method Diagnosis Code(s):        --- Professional ---                           K44.9, Diaphragmatic hernia without obstruction or                            gangrene                           K31.811, Angiodysplasia of stomach and duodenum                            with bleeding                           K55.20, Angiodysplasia of colon without hemorrhage                           D50.0, Iron deficiency anemia secondary to blood                            loss (chronic) CPT copyright 2016 American Medical Association. All rights reserved. The codes documented in this report are preliminary and upon coder review may  be revised to meet current compliance requirements. Clarene Essex, MD Clarene Essex, MD 07/25/2015 2:14:52 PM This report has been signed electronically. Number of Addenda: 0

## 2015-07-25 NOTE — Progress Notes (Signed)
[name] 1:11 PM  Subjective: Patient doing well without any signs of bleeding in the last weekand no new complaints  Objective: Vital signs stable afebrile no acute distress exam please see preassessment evaluation CBC 2 days ago okay  Assessment: Subacute GI bleeding probably AVMs positive capsule Endoscopy  Plan: Okay to proceed with enteroscope and possible APC with anesthesia assistance  [facility] E  Pager [phone] After 5PM or if no answer call [phone]

## 2015-07-25 NOTE — Telephone Encounter (Signed)
lvm to inform pt of lab only appt 4/28 at 1030am

## 2015-07-25 NOTE — Discharge Instructions (Signed)
Call if question or problem or if follow-up requested per hematologyEsophagogastroduodenoscopy, Care After Refer to this sheet in the next few weeks. These instructions provide you with information about caring for yourself after your procedure. Your health care provider may also give you more specific instructions. Your treatment has been planned according to current medical practices, but problems sometimes occur. Call your health care provider if you have any problems or questions after your procedure. WHAT TO EXPECT AFTER THE PROCEDURE After your procedure, it is typical to feel:  Soreness in your throat.  Pain with swallowing.  Sick to your stomach (nauseous).  Bloated.  Dizzy.  Fatigued. HOME CARE INSTRUCTIONS  Do not eat or drink anything until the numbing medicine (local anesthetic) has worn off and your gag reflex has returned. You will know that the local anesthetic has worn off when you can swallow comfortably.  Do not drive or operate machinery until directed by your health care provider.  Take medicines only as directed by your health care provider. SEEK MEDICAL CARE IF:   You cannot stop coughing.  You are not urinating at all or less than usual. SEEK IMMEDIATE MEDICAL CARE IF:  You have difficulty swallowing.  You cannot eat or drink.  You have worsening throat or chest pain.  You have dizziness or lightheadedness or you faint.  You have nausea or vomiting.  You have chills.  You have a fever.  You have severe abdominal pain.  You have black, tarry, or bloody stools.   This information is not intended to replace advice given to you by your health care provider. Make sure you discuss any questions you have with your health care provider.   Document Released: 03/04/2012 Document Revised: 04/08/2014 Document Reviewed: 03/04/2012 Elsevier Interactive Patient Education [employer].

## 2015-07-25 NOTE — Anesthesia Preprocedure Evaluation (Addendum)
Anesthesia Evaluation  Patient identified by MRN, date of birth, ID band Patient awake    Reviewed: Allergy & Precautions, NPO status , Patient's Chart, lab work & pertinent test results  History of Anesthesia Complications (+) history of anesthetic complications  Airway Mallampati: III  TM Distance: >3 FB Neck ROM: Full    Dental  (+) Caps   Pulmonary pneumonia, resolved, former smoker,    Pulmonary exam normal breath sounds clear to auscultation       Cardiovascular hypertension, Pt. on medications + Peripheral Vascular Disease  + dysrhythmias Atrial Fibrillation + Valvular Problems/Murmurs AS and AI  Rhythm:Regular Rate:Normal + Systolic murmurs LVEF 50-09%    Neuro/Psych TIACVA, Residual Symptoms negative psych ROS   GI/Hepatic Neg liver ROS, GERD  Medicated and Controlled,Hematemesis   Endo/Other  Hypothyroidism Hx/o breast Ca Hyperlipidemia  Renal/GU negative Renal ROS  negative genitourinary   Musculoskeletal  (+) Arthritis , Osteoarthritis,    Abdominal   Peds  Hematology  (+) anemia ,   Anesthesia Other Findings   Reproductive/Obstetrics                            Anesthesia Physical Anesthesia Plan  ASA: III  Anesthesia Plan: General   Post-op Pain Management:    Induction: Intravenous  Airway Management Planned: Natural Airway and Nasal Cannula  Additional Equipment:   Intra-op Plan:   Post-operative Plan: Extubation in OR  Informed Consent: I have reviewed the patients History and Physical, chart, labs and discussed the procedure including the risks, benefits and alternatives for the proposed anesthesia with the patient or authorized representative who has indicated his/her understanding and acceptance.   Dental advisory given  Plan Discussed with: CRNA, Anesthesiologist and Surgeon  Anesthesia Plan Comments:         Anesthesia Quick Evaluation

## 2015-07-25 NOTE — Transfer of Care (Signed)
Immediate Anesthesia Transfer of Care Note  Patient: Deborah [name]  Procedure(s) Performed: Procedure(s): ESOPHAGOGASTRODUODENOSCOPY (EGD) WITH PROPOFOL (N/A) HOT HEMOSTASIS (ARGON PLASMA COAGULATION/BICAP) (N/A)  Patient Location: PACU and Endoscopy Unit  Anesthesia Type:MAC  Level of Consciousness: awake, alert , oriented and sedated  Airway & Oxygen Therapy: Patient Spontanous Breathing and Patient connected to nasal cannula oxygen  Post-op Assessment: Report given to RN, Post -op Vital signs reviewed and stable and Patient moving all extremities  Post vital signs: Reviewed and stable  Last Vitals:  Filed Vitals:   07/25/15 1148 07/25/15 1416  BP: 156/81 167/71  Pulse: 81 101  Temp: 36.5 C   Resp: 18 22    Complications: No apparent anesthesia complications

## 2015-07-26 ENCOUNTER — Encounter (HOSPITAL_COMMUNITY): Payer: Self-pay | Admitting: Gastroenterology

## 2015-07-28 ENCOUNTER — Other Ambulatory Visit (HOSPITAL_BASED_OUTPATIENT_CLINIC_OR_DEPARTMENT_OTHER): Payer: Medicare Other

## 2015-07-28 DIAGNOSIS — D689 Coagulation defect, unspecified: Secondary | ICD-10-CM

## 2015-07-28 DIAGNOSIS — D500 Iron deficiency anemia secondary to blood loss (chronic): Secondary | ICD-10-CM

## 2015-07-28 LAB — CBC WITH DIFFERENTIAL/PLATELET
BASO%: 0.7 % (ref 0.0–2.0)
BASOS ABS: 0 10*3/uL (ref 0.0–0.1)
EOS ABS: 0 10*3/uL (ref 0.0–0.5)
EOS%: 0.7 % (ref 0.0–7.0)
HEMATOCRIT: 35.1 % (ref 34.8–46.6)
HEMOGLOBIN: 11.3 g/dL — AB (ref 11.6–15.9)
LYMPH#: 0.4 10*3/uL — AB (ref 0.9–3.3)
LYMPH%: 9.2 % — ABNORMAL LOW (ref 14.0–49.7)
MCH: 30.6 pg (ref 25.1–34.0)
MCHC: 32.1 g/dL (ref 31.5–36.0)
MCV: 95.4 fL (ref 79.5–101.0)
MONO#: 0.3 10*3/uL (ref 0.1–0.9)
MONO%: 6.8 % (ref 0.0–14.0)
NEUT#: 3.4 10*3/uL (ref 1.5–6.5)
NEUT%: 82.6 % — AB (ref 38.4–76.8)
PLATELETS: 179 10*3/uL (ref 145–400)
RBC: 3.68 10*6/uL — ABNORMAL LOW (ref 3.70–5.45)
RDW: 19 % — AB (ref 11.2–14.5)
WBC: 4.2 10*3/uL (ref 3.9–10.3)

## 2015-07-28 LAB — COMPREHENSIVE METABOLIC PANEL
ALT: 17 U/L (ref 0–55)
AST: 15 U/L (ref 5–34)
Albumin: 3.4 g/dL — ABNORMAL LOW (ref 3.5–5.0)
Alkaline Phosphatase: 49 U/L (ref 40–150)
Anion Gap: 6 mEq/L (ref 3–11)
BILIRUBIN TOTAL: 0.33 mg/dL (ref 0.20–1.20)
BUN: 31.5 mg/dL — ABNORMAL HIGH (ref 7.0–26.0)
CALCIUM: 9.8 mg/dL (ref 8.4–10.4)
CHLORIDE: 100 meq/L (ref 98–109)
CO2: 27 mEq/L (ref 22–29)
CREATININE: 0.9 mg/dL (ref 0.6–1.1)
EGFR: 58 mL/min/{1.73_m2} — ABNORMAL LOW (ref >90)
Glucose: 99 mg/dl (ref 70–140)
Potassium: 4.4 mEq/L (ref 3.5–5.1)
Sodium: 134 mEq/L — ABNORMAL LOW (ref 136–145)
TOTAL PROTEIN: 5.9 g/dL — AB (ref 6.4–8.3)

## 2015-07-28 LAB — PROTIME-INR
INR: 1.3 — AB (ref 2.00–3.50)
Protime: 15.6 Seconds — ABNORMAL HIGH (ref 10.6–13.4)

## 2015-07-31 ENCOUNTER — Ambulatory Visit (HOSPITAL_BASED_OUTPATIENT_CLINIC_OR_DEPARTMENT_OTHER): Payer: Medicare Other

## 2015-07-31 ENCOUNTER — Other Ambulatory Visit: Payer: Self-pay | Admitting: Oncology

## 2015-07-31 ENCOUNTER — Other Ambulatory Visit: Payer: Self-pay | Admitting: *Deleted

## 2015-07-31 DIAGNOSIS — Z7901 Long term (current) use of anticoagulants: Secondary | ICD-10-CM

## 2015-07-31 DIAGNOSIS — D689 Coagulation defect, unspecified: Secondary | ICD-10-CM

## 2015-07-31 DIAGNOSIS — D509 Iron deficiency anemia, unspecified: Secondary | ICD-10-CM | POA: Diagnosis present

## 2015-07-31 DIAGNOSIS — D500 Iron deficiency anemia secondary to blood loss (chronic): Secondary | ICD-10-CM

## 2015-07-31 DIAGNOSIS — R5381 Other malaise: Secondary | ICD-10-CM

## 2015-07-31 DIAGNOSIS — R5383 Other fatigue: Secondary | ICD-10-CM

## 2015-07-31 DIAGNOSIS — K921 Melena: Secondary | ICD-10-CM

## 2015-07-31 LAB — CBC WITH DIFFERENTIAL/PLATELET
BASO%: 0.3 % (ref 0.0–2.0)
BASOS ABS: 0 10*3/uL (ref 0.0–0.1)
EOS%: 1.1 % (ref 0.0–7.0)
Eosinophils Absolute: 0 10*3/uL (ref 0.0–0.5)
HCT: 33.1 % — ABNORMAL LOW (ref 34.8–46.6)
HGB: 10.8 g/dL — ABNORMAL LOW (ref 11.6–15.9)
LYMPH%: 12.6 % — AB (ref 14.0–49.7)
MCH: 31 pg (ref 25.1–34.0)
MCHC: 32.6 g/dL (ref 31.5–36.0)
MCV: 95.1 fL (ref 79.5–101.0)
MONO#: 0.4 10*3/uL (ref 0.1–0.9)
MONO%: 9.9 % (ref 0.0–14.0)
NEUT#: 2.8 10*3/uL (ref 1.5–6.5)
NEUT%: 76.1 % (ref 38.4–76.8)
Platelets: 183 10*3/uL (ref 145–400)
RBC: 3.48 10*6/uL — ABNORMAL LOW (ref 3.70–5.45)
RDW: 17.9 % — ABNORMAL HIGH (ref 11.2–14.5)
WBC: 3.7 10*3/uL — ABNORMAL LOW (ref 3.9–10.3)
lymph#: 0.5 10*3/uL — ABNORMAL LOW (ref 0.9–3.3)

## 2015-07-31 LAB — PROTIME-INR
INR: 1.5 — AB (ref 2.00–3.50)
PROTIME: 18 s — AB (ref 10.6–13.4)

## 2015-07-31 LAB — FERRITIN: Ferritin: 67 ng/ml (ref 9–269)

## 2015-08-01 ENCOUNTER — Telehealth: Payer: Self-pay | Admitting: Oncology

## 2015-08-01 NOTE — Telephone Encounter (Signed)
s.w. pt and advised on may appts....pt ok and aware

## 2015-08-03 ENCOUNTER — Telehealth: Payer: Self-pay | Admitting: *Deleted

## 2015-08-03 ENCOUNTER — Other Ambulatory Visit (HOSPITAL_BASED_OUTPATIENT_CLINIC_OR_DEPARTMENT_OTHER): Payer: Medicare Other

## 2015-08-03 ENCOUNTER — Ambulatory Visit (HOSPITAL_BASED_OUTPATIENT_CLINIC_OR_DEPARTMENT_OTHER): Payer: Medicare Other

## 2015-08-03 VITALS — BP 136/57 | HR 73 | Temp 98.0°F | Resp 18

## 2015-08-03 DIAGNOSIS — D689 Coagulation defect, unspecified: Secondary | ICD-10-CM

## 2015-08-03 DIAGNOSIS — D500 Iron deficiency anemia secondary to blood loss (chronic): Secondary | ICD-10-CM | POA: Diagnosis present

## 2015-08-03 DIAGNOSIS — I481 Persistent atrial fibrillation: Secondary | ICD-10-CM | POA: Diagnosis not present

## 2015-08-03 DIAGNOSIS — K922 Gastrointestinal hemorrhage, unspecified: Secondary | ICD-10-CM | POA: Diagnosis not present

## 2015-08-03 DIAGNOSIS — K921 Melena: Secondary | ICD-10-CM

## 2015-08-03 DIAGNOSIS — D62 Acute posthemorrhagic anemia: Secondary | ICD-10-CM

## 2015-08-03 DIAGNOSIS — I4819 Other persistent atrial fibrillation: Secondary | ICD-10-CM

## 2015-08-03 LAB — PROTIME-INR
INR: 1.8 — AB (ref 2.00–3.50)
Protime: 21.6 Seconds — ABNORMAL HIGH (ref 10.6–13.4)

## 2015-08-03 LAB — CBC WITH DIFFERENTIAL/PLATELET
BASO%: 0.5 % (ref 0.0–2.0)
BASOS ABS: 0 10*3/uL (ref 0.0–0.1)
EOS ABS: 0 10*3/uL (ref 0.0–0.5)
EOS%: 1.5 % (ref 0.0–7.0)
HEMATOCRIT: 31.4 % — AB (ref 34.8–46.6)
HEMOGLOBIN: 10.2 g/dL — AB (ref 11.6–15.9)
LYMPH#: 0.5 10*3/uL — AB (ref 0.9–3.3)
LYMPH%: 14.7 % (ref 14.0–49.7)
MCH: 30.9 pg (ref 25.1–34.0)
MCHC: 32.4 g/dL (ref 31.5–36.0)
MCV: 95.3 fL (ref 79.5–101.0)
MONO#: 0.2 10*3/uL (ref 0.1–0.9)
MONO%: 7.3 % (ref 0.0–14.0)
NEUT#: 2.5 10*3/uL (ref 1.5–6.5)
NEUT%: 76 % (ref 38.4–76.8)
Platelets: 171 10*3/uL (ref 145–400)
RBC: 3.3 10*6/uL — ABNORMAL LOW (ref 3.70–5.45)
RDW: 19.6 % — AB (ref 11.2–14.5)
WBC: 3.3 10*3/uL — ABNORMAL LOW (ref 3.9–10.3)

## 2015-08-03 LAB — FERRITIN: Ferritin: 73 ng/ml (ref 9–269)

## 2015-08-03 MED ORDER — FERUMOXYTOL INJECTION 510 MG/17 ML
510.0000 mg | Freq: Once | INTRAVENOUS | Status: AC
  Administered 2015-08-03: 510 mg via INTRAVENOUS
  Filled 2015-08-03: qty 17

## 2015-08-03 MED ORDER — SODIUM CHLORIDE 0.9 % IV SOLN
Freq: Once | INTRAVENOUS | Status: AC
  Administered 2015-08-03: 13:00:00 via INTRAVENOUS

## 2015-08-03 MED ORDER — SODIUM CHLORIDE 0.9 % IJ SOLN
10.0000 mL | INTRAMUSCULAR | Status: DC | PRN
  Administered 2015-08-03: 10 mL
  Filled 2015-08-03: qty 10

## 2015-08-03 MED ORDER — HEPARIN SOD (PORK) LOCK FLUSH 100 UNIT/ML IV SOLN
500.0000 [IU] | Freq: Once | INTRAVENOUS | Status: AC | PRN
  Administered 2015-08-03: 500 [IU]
  Filled 2015-08-03: qty 5

## 2015-08-03 NOTE — Telephone Encounter (Signed)
INR 1.8  Coumadin dose is 5 mg daily Currently on lovenox due to recent bridging for procedure  Per MD pt may stop .lovenox post dose today ( already given ) and continue on 5 mg coumadin Recheck lab on 5/11 with infusion appointment unless increase in dark stools or other symptoms occur.

## 2015-08-03 NOTE — Patient Instructions (Signed)

## 2015-08-05 ENCOUNTER — Emergency Department (HOSPITAL_COMMUNITY): Payer: Medicare Other

## 2015-08-05 ENCOUNTER — Emergency Department (HOSPITAL_COMMUNITY)
Admission: EM | Admit: 2015-08-05 | Discharge: 2015-08-05 | Disposition: A | Discharge status: Home / Self Care | Payer: Medicare Other | Attending: Emergency Medicine | Admitting: Emergency Medicine

## 2015-08-05 ENCOUNTER — Encounter (HOSPITAL_COMMUNITY): Payer: Self-pay | Admitting: Emergency Medicine

## 2015-08-05 DIAGNOSIS — I4891 Unspecified atrial fibrillation: Secondary | ICD-10-CM | POA: Diagnosis not present

## 2015-08-05 DIAGNOSIS — Z79899 Other long term (current) drug therapy: Secondary | ICD-10-CM | POA: Insufficient documentation

## 2015-08-05 DIAGNOSIS — M7981 Nontraumatic hematoma of soft tissue: Secondary | ICD-10-CM | POA: Insufficient documentation

## 2015-08-05 DIAGNOSIS — Z87891 Personal history of nicotine dependence: Secondary | ICD-10-CM | POA: Diagnosis not present

## 2015-08-05 DIAGNOSIS — I10 Essential (primary) hypertension: Secondary | ICD-10-CM | POA: Insufficient documentation

## 2015-08-05 DIAGNOSIS — Z791 Long term (current) use of non-steroidal anti-inflammatories (NSAID): Secondary | ICD-10-CM | POA: Insufficient documentation

## 2015-08-05 DIAGNOSIS — R011 Cardiac murmur, unspecified: Secondary | ICD-10-CM | POA: Insufficient documentation

## 2015-08-05 DIAGNOSIS — M160 Bilateral primary osteoarthritis of hip: Secondary | ICD-10-CM | POA: Insufficient documentation

## 2015-08-05 DIAGNOSIS — Z8701 Personal history of pneumonia (recurrent): Secondary | ICD-10-CM | POA: Insufficient documentation

## 2015-08-05 DIAGNOSIS — R109 Unspecified abdominal pain: Secondary | ICD-10-CM | POA: Insufficient documentation

## 2015-08-05 DIAGNOSIS — E039 Hypothyroidism, unspecified: Secondary | ICD-10-CM | POA: Insufficient documentation

## 2015-08-05 DIAGNOSIS — M109 Gout, unspecified: Secondary | ICD-10-CM | POA: Insufficient documentation

## 2015-08-05 DIAGNOSIS — E7800 Pure hypercholesterolemia, unspecified: Secondary | ICD-10-CM | POA: Insufficient documentation

## 2015-08-05 DIAGNOSIS — Z8673 Personal history of transient ischemic attack (TIA), and cerebral infarction without residual deficits: Secondary | ICD-10-CM | POA: Insufficient documentation

## 2015-08-05 DIAGNOSIS — Z90710 Acquired absence of both cervix and uterus: Secondary | ICD-10-CM | POA: Insufficient documentation

## 2015-08-05 DIAGNOSIS — Z9889 Other specified postprocedural states: Secondary | ICD-10-CM | POA: Insufficient documentation

## 2015-08-05 DIAGNOSIS — R1904 Left lower quadrant abdominal swelling, mass and lump: Secondary | ICD-10-CM | POA: Diagnosis not present

## 2015-08-05 DIAGNOSIS — Z9049 Acquired absence of other specified parts of digestive tract: Secondary | ICD-10-CM | POA: Diagnosis not present

## 2015-08-05 DIAGNOSIS — Z7901 Long term (current) use of anticoagulants: Secondary | ICD-10-CM | POA: Insufficient documentation

## 2015-08-05 DIAGNOSIS — Z853 Personal history of malignant neoplasm of breast: Secondary | ICD-10-CM | POA: Insufficient documentation

## 2015-08-05 DIAGNOSIS — Z8719 Personal history of other diseases of the digestive system: Secondary | ICD-10-CM | POA: Diagnosis not present

## 2015-08-05 DIAGNOSIS — Z862 Personal history of diseases of the blood and blood-forming organs and certain disorders involving the immune mechanism: Secondary | ICD-10-CM | POA: Insufficient documentation

## 2015-08-05 LAB — CBC WITH DIFFERENTIAL/PLATELET
Basophils Absolute: 0 10*3/uL (ref 0.0–0.1)
Basophils Relative: 0 %
Eosinophils Absolute: 0 10*3/uL (ref 0.0–0.7)
Eosinophils Relative: 1 %
HCT: 34.5 % — ABNORMAL LOW (ref 36.0–46.0)
Hemoglobin: 11.1 g/dL — ABNORMAL LOW (ref 12.0–15.0)
Lymphocytes Relative: 9 %
Lymphs Abs: 0.4 10*3/uL — ABNORMAL LOW (ref 0.7–4.0)
MCH: 31.1 pg (ref 26.0–34.0)
MCHC: 32.2 g/dL (ref 30.0–36.0)
MCV: 96.6 fL (ref 78.0–100.0)
Monocytes Absolute: 0.2 10*3/uL (ref 0.1–1.0)
Monocytes Relative: 5 %
Neutro Abs: 3.8 10*3/uL (ref 1.7–7.7)
Neutrophils Relative %: 85 %
Platelets: 230 10*3/uL (ref 150–400)
RBC: 3.57 MIL/uL — ABNORMAL LOW (ref 3.87–5.11)
RDW: 17.7 % — ABNORMAL HIGH (ref 11.5–15.5)
WBC: 4.4 10*3/uL (ref 4.0–10.5)

## 2015-08-05 LAB — BASIC METABOLIC PANEL
Anion gap: 10 (ref 5–15)
BUN: 29 mg/dL — ABNORMAL HIGH (ref 6–20)
CO2: 25 mmol/L (ref 22–32)
Calcium: 10.9 mg/dL — ABNORMAL HIGH (ref 8.9–10.3)
Chloride: 95 mmol/L — ABNORMAL LOW (ref 101–111)
Creatinine, Ser: 0.91 mg/dL (ref 0.44–1.00)
GFR calc Af Amer: >60 mL/min (ref >60)
GFR calc non Af Amer: 56 mL/min — ABNORMAL LOW (ref >60)
Glucose, Bld: 160 mg/dL — ABNORMAL HIGH (ref 65–99)
Potassium: 4.4 mmol/L (ref 3.5–5.1)
Sodium: 130 mmol/L — ABNORMAL LOW (ref 135–145)

## 2015-08-05 LAB — PROTIME-INR
INR: 1.91 — ABNORMAL HIGH (ref 0.00–1.49)
PROTHROMBIN TIME: 21.2 s — AB (ref 11.6–15.2)

## 2015-08-05 MED ORDER — HEPARIN SOD (PORK) LOCK FLUSH 100 UNIT/ML IV SOLN
500.0000 [IU] | Freq: Once | INTRAVENOUS | Status: AC
  Administered 2015-08-05: 500 [IU]
  Filled 2015-08-05: qty 5

## 2015-08-05 MED ORDER — SODIUM CHLORIDE 0.9 % IV BOLUS (SEPSIS)
1000.0000 mL | Freq: Once | INTRAVENOUS | Status: AC
  Administered 2015-08-05: 1000 mL via INTRAVENOUS

## 2015-08-05 MED ORDER — IOPAMIDOL (ISOVUE-300) INJECTION 61%
100.0000 mL | Freq: Once | INTRAVENOUS | Status: AC | PRN
  Administered 2015-08-05: 100 mL via INTRAVENOUS

## 2015-08-05 NOTE — ED Notes (Addendum)
Pt states she was told to come here by her hematologist. Pt states her INR was 1.8 on Thursday. Pt states she was switched from coumadin to Lovenox recently. Pt states that this happened in Sept and she was hospitalized in the ICU for a similar issue. Pt states she has had increased abdominal aching and back pain. Pt denies tarry stools or abdominal cramping. Pt denies noting any bleeding.  Pt states she receives frequent blood transfusions and last had one 3 weeks ago

## 2015-08-05 NOTE — Discharge Instructions (Signed)

## 2015-08-07 ENCOUNTER — Telehealth: Payer: Self-pay | Admitting: *Deleted

## 2015-08-07 NOTE — Telephone Encounter (Signed)
No entry 

## 2015-08-10 ENCOUNTER — Ambulatory Visit: Payer: Medicare Other

## 2015-08-10 ENCOUNTER — Other Ambulatory Visit (HOSPITAL_BASED_OUTPATIENT_CLINIC_OR_DEPARTMENT_OTHER): Payer: Medicare Other

## 2015-08-10 DIAGNOSIS — D689 Coagulation defect, unspecified: Secondary | ICD-10-CM

## 2015-08-10 DIAGNOSIS — D500 Iron deficiency anemia secondary to blood loss (chronic): Secondary | ICD-10-CM | POA: Diagnosis not present

## 2015-08-10 DIAGNOSIS — K921 Melena: Secondary | ICD-10-CM

## 2015-08-10 DIAGNOSIS — I4819 Other persistent atrial fibrillation: Secondary | ICD-10-CM

## 2015-08-10 DIAGNOSIS — I481 Persistent atrial fibrillation: Secondary | ICD-10-CM

## 2015-08-10 DIAGNOSIS — D62 Acute posthemorrhagic anemia: Secondary | ICD-10-CM

## 2015-08-10 LAB — PROTIME-INR
INR: 2.4 (ref 2.00–3.50)
Protime: 28.8 Seconds — ABNORMAL HIGH (ref 10.6–13.4)

## 2015-08-10 LAB — CBC WITH DIFFERENTIAL/PLATELET
BASO%: 0.2 % (ref 0.0–2.0)
BASOS ABS: 0 10*3/uL (ref 0.0–0.1)
EOS ABS: 0.1 10*3/uL (ref 0.0–0.5)
EOS%: 1 % (ref 0.0–7.0)
HCT: 33 % — ABNORMAL LOW (ref 34.8–46.6)
HGB: 10.7 g/dL — ABNORMAL LOW (ref 11.6–15.9)
LYMPH%: 12.2 % — AB (ref 14.0–49.7)
MCH: 31.9 pg (ref 25.1–34.0)
MCHC: 32.4 g/dL (ref 31.5–36.0)
MCV: 98.5 fL (ref 79.5–101.0)
MONO#: 0.4 10*3/uL (ref 0.1–0.9)
MONO%: 8.3 % (ref 0.0–14.0)
NEUT#: 3.8 10*3/uL (ref 1.5–6.5)
NEUT%: 78.3 % — AB (ref 38.4–76.8)
NRBC: 0 % (ref 0–0)
PLATELETS: 172 10*3/uL (ref 145–400)
RBC: 3.35 10*6/uL — AB (ref 3.70–5.45)
RDW: 18.8 % — AB (ref 11.2–14.5)
WBC: 4.8 10*3/uL (ref 3.9–10.3)
lymph#: 0.6 10*3/uL — ABNORMAL LOW (ref 0.9–3.3)

## 2015-08-11 ENCOUNTER — Telehealth: Payer: Self-pay | Admitting: *Deleted

## 2015-08-11 NOTE — Telephone Encounter (Signed)
INR 2.4  Coumadin dose 5 mg daily  No bleeding at this time  Next lab 08/17/2015  No change in current dose- recheck as scheduled - communication made with patient

## 2015-08-13 NOTE — ED Provider Notes (Signed)
CSN: [id number]     Arrival date & time 08/05/15  1945 History   First MD Initiated Contact with Patient 08/05/15 2049     Chief Complaint  Patient presents with  . critical lab      (Consider location/radiation/quality/duration/timing/severity/associated sxs/prior Treatment) HPI   82 year old female with some mild abdominal pain. She is concerned that she may have an abdominal hematoma. She has a history of this. She is anticoagulated with Coumadin and has recently been getting Lovenox injections. [name] been having some abdominal wall ecchymosis and some pain in left side of her abdomen. No pain in her back. No dizziness or lightheadedness. No blood in her stools.  Past Medical History  Diagnosis Date  . Hypothyroidism   . Stomach problems     seeing Dr Watt Climes  . Hypertension   . GERD (gastroesophageal reflux disease)   . Anemia   . Arrhythmia     Atrial Fibrillation  . Breast cancer ([place])     "right; S/P lumpectomy, chemo, XRT"   . High cholesterol   . Heart murmur   . History of blood transfusion 1996; ~ 2013    "related to valve replacement; GI bleeding"   . Chronic lower GI bleeding   . Arthritis     "fingers, toes, hips; qwhere" (05/19/2013)  . Gout   . Pneumonia 07/2011  . Rectus sheath hematoma 12/19/14     held anticoagulation for 1 week, transfused  . TIA (transient ischemic attack) 12/24/14    symptoms resolved  . History of nuclear stress test     Myoview 1/17: EF 59%, normal perfusion, low risk  . History of echocardiogram     Echo 1/17: mild LVH, EF 50-55%, no RWMA, mild AI, mechanical AVR with mean 21 mmHg/peak 42 mmHg (stable since 2012), mod MR, mod to severe BAE, PASP 36 mmHg  . Complication of anesthesia     "once I'm awakened, I don't sleep til the following night"   Past Surgical History  Procedure Laterality Date  . Arteriogram  01/12    NO RAS   . Cardioversion      X 2  . Cardiac catheterization    . Cataract extraction w/ intraocular lens   implant, bilateral Bilateral     bilateral cataract with lens implants  . Cholecystectomy    . Abdominal hysterectomy    . Appendectomy    . Colonoscopy with propofol N/A 11/24/2012    Procedure: COLONOSCOPY WITH PROPOFOL;  Surgeon: Jeryl Columbia, MD;  Location: [facility] ENDOSCOPY;  Service: Endoscopy;  Laterality: N/A;  . Breast biopsy Right   . Breast lumpectomy Right   . Aortic valve replacement  1996    [employer]  . Esophagogastroduodenoscopy (egd) with propofol N/A 07/25/2015    Procedure: ESOPHAGOGASTRODUODENOSCOPY (EGD) WITH PROPOFOL;  Surgeon: Clarene Essex, MD;  Location: [facility] ENDOSCOPY;  Service: Endoscopy;  Laterality: N/A;  . Hot hemostasis N/A 07/25/2015    Procedure: HOT HEMOSTASIS (ARGON PLASMA COAGULATION/BICAP);  Surgeon: Clarene Essex, MD;  Location: [facility] ENDOSCOPY;  Service: Endoscopy;  Laterality: N/A;   Family History  Problem Relation Age of Onset  . Dementia Mother   . Coronary artery disease Father   . Breast cancer Daughter   . Colon cancer Sister   . Liver cancer Sister   . Lung cancer Brother   . COPD Brother    Social History  Substance Use Topics  . Smoking status: Former Smoker -- 1.00 packs/day for 20 years    Types:  Cigarettes    Quit date: 04/01/1988  . Smokeless tobacco: Never Used  . Alcohol Use: 4.8 oz/week    4 Glasses of wine, 4 Shots of liquor per week     Comment: burbon or glass wine daily   OB History    No data available     Review of Systems  All systems reviewed and negative, other than as noted in HPI.   Allergies  Amiodarone; Alendronate sodium; Macrodantin; Meclizine; Norvasc; and Tussionex pennkinetic er  Home Medications   Prior to Admission medications   Medication Sig Start Date End Date Taking? Authorizing Provider  acetaminophen (TYLENOL) 500 MG tablet Take 1,000 mg by mouth every 6 (six) hours as needed for mild pain, moderate pain or headache.    Yes Historical Provider, MD  allopurinol (ZYLOPRIM) 300 MG tablet Take 300 mg by  mouth daily.   Yes Historical Provider, MD  amLODipine (NORVASC) 5 MG tablet Take 5 mg by mouth daily.    Yes Historical Provider, MD  calcium-vitamin D 500 MG tablet Take 1 tablet by mouth 2 (two) times daily.     Yes Historical Provider, MD  celecoxib (CELEBREX) 200 MG capsule Take 200 mg by mouth daily. Every 2-3 days 12/07/14  Yes Historical Provider, MD  cloNIDine (CATAPRES) 0.1 MG tablet Take 0.2 mg by mouth 2 (two) times daily. 03/06/15  Yes Darlin Coco, MD  dicyclomine (BENTYL) 10 MG capsule Take 10 mg by mouth daily as needed for spasms.    Yes Historical Provider, MD  digoxin (LANOXIN) 0.125 MG tablet Take 0.125 mg by mouth daily.   Yes Historical Provider, MD  fexofenadine (ALLEGRA ALLERGY) 180 MG tablet Take 180 mg by mouth daily as needed for allergies.    Yes Historical Provider, MD  hydrochlorothiazide (HYDRODIURIL) 25 MG tablet Take 1 tablet (25 mg total) by mouth every evening. 06/30/15  Yes Skeet Latch, MD  levothyroxine (SYNTHROID, LEVOTHROID) 88 MCG tablet Take 88 mcg by mouth daily before breakfast.   Yes Historical Provider, MD  lidocaine-prilocaine (EMLA) cream Apply 1 application topically as needed (prior to infusion at porta cath site). 09/01/14  Yes [name], MD  losartan (COZAAR) 100 MG tablet Take 1 tablet (100 mg total) by mouth every evening. 06/30/15  Yes Skeet Latch, MD  multivitamin [facility]) per tablet Take 1 tablet by mouth daily.    Yes Historical Provider, MD  NITROSTAT 0.4 MG SL tablet Place 0.4 mg under the tongue every 5 (five) minutes as needed for chest pain (x 3 doses).  09/12/14  Yes Historical Provider, MD  Omega-3 Fatty Acids (FISH OIL) 1000 MG CAPS Take 1,000 mg by mouth 2 (two) times daily.    Yes Historical Provider, MD  simvastatin (ZOCOR) 20 MG tablet TAKE ONE-HALF TABLET DAILY 04/21/15  Yes Darlin Coco, MD  spironolactone (ALDACTONE) 25 MG tablet Take 25 mg by mouth 2 (two) times daily. 03/20/15  Yes Historical Provider, MD   warfarin (COUMADIN) 5 MG tablet TAKE ONE TABLET BY MOUTH ONCE DAILY 07/08/15  Yes [name], MD  zolpidem (AMBIEN CR) 12.5 MG CR tablet Take 12.5 mg by mouth at bedtime as needed for sleep.   Yes Historical Provider, MD  levofloxacin (LEVAQUIN) 500 MG tablet Take 1 tablet (500 mg total) by mouth daily. Patient not taking: Reported on 08/05/2015 07/13/15   Susanne Borders, NP   BP 150/67 mmHg  Pulse 71  Temp(Src) 97.8 F (36.6 C) (Oral)  Resp 16  SpO2 97% Physical  Exam  Constitutional: She appears well-developed and well-nourished. No distress.  HENT:  Head: Normocephalic and atraumatic.  Eyes: Conjunctivae are normal. Right eye exhibits no discharge. Left eye exhibits no discharge.  Neck: Neck supple.  Cardiovascular: Normal rate, regular rhythm and normal heart sounds.  Exam reveals no gallop and no friction rub.   No murmur heard. Pulmonary/Chest: Effort normal and breath sounds normal. No respiratory distress.  Abdominal: Soft. She exhibits no distension. There is no tenderness.  Scattered subacute appearing ecchymosis to the anterior abdominal wall. There are some small subcutaneous nodules in the left lower quadrant. No significant tenderness.  Musculoskeletal: She exhibits no edema or tenderness.  Neurological: She is alert.  Skin: Skin is warm and dry.  Psychiatric: She has a normal mood and affect. Her behavior is normal. Thought content normal.  Nursing note and vitals reviewed.   ED Course  Procedures (including critical care time) Labs Review Labs Reviewed  PROTIME-INR - Abnormal; Notable for the following:    Prothrombin Time 21.2 (*)    INR 1.91 (*)    All other components within normal limits  CBC WITH DIFFERENTIAL/PLATELET - Abnormal; Notable for the following:    RBC 3.57 (*)    Hemoglobin 11.1 (*)    HCT 34.5 (*)    RDW 17.7 (*)    Lymphs Abs 0.4 (*)    All other components within normal limits  BASIC METABOLIC PANEL - Abnormal; Notable for the  following:    Sodium 130 (*)    Chloride 95 (*)    Glucose, Bld 160 (*)    BUN 29 (*)    Calcium 10.9 (*)    GFR calc non Af Amer 56 (*)    All other components within normal limits    Imaging Review No results found. I have personally reviewed and evaluated these images and lab results as part of my medical decision-making.   EKG Interpretation None      MDM   Final diagnoses:  Abdominal pain, unspecified abdominal location    82 year old female with concern for abdominal hematoma. Exam shows some ecchymosis and subcutaneous nodule in her left lower quadrant. I suspect this is from recent Lovenox injections. Clinically I do not appreciate a significant hematoma. CT without evidence of this as well. Her INR is acceptable. Return precautions were discussed. Outpatient follow-up.    Virgel Manifold, MD 08/13/15 [phone]

## 2015-08-14 ENCOUNTER — Ambulatory Visit (INDEPENDENT_AMBULATORY_CARE_PROVIDER_SITE_OTHER): Payer: Medicare Other | Admitting: *Deleted

## 2015-08-14 ENCOUNTER — Telehealth: Payer: Self-pay | Admitting: Cardiovascular Disease

## 2015-08-14 VITALS — BP 118/62 | HR 60 | Ht 60.0 in | Wt 119.0 lb

## 2015-08-14 DIAGNOSIS — I482 Chronic atrial fibrillation, unspecified: Secondary | ICD-10-CM

## 2015-08-14 NOTE — Progress Notes (Signed)
1.) Reason for visit: BP has been spiking at night and waking her up with a headache. This has happened the last 2 to 3 nights. It makes her feel tired after she gets up. BP has been as high as 185/115. She feels better when she gets up and moves around.  2.) Name of MD requesting visit: Visit scheduled from triage call.  3.) H&P: hx A fib, aortic valve replacement, HTN, hyperlipidemia.  4.) ROS related to problem: Pt brought in for EKG check. BP 118/62, pulse 60. Patient denies CP, SOB, palpitations, N/V or sweating. She says she has had leg/foot cramps some nights, she eats some mustard and it goes away. Pt ambulated with no problem to exam room.  5.) Assessment and plan per MD: These symptoms and findings presented to Dr Stanford Breed, DOD, along with EKG. He reviewed the EKG and determined it was similar to her last EKG.  He said no changes needed to be made to her medications today given her BP at this time and her age. He wanted her to follow up with Dr Oval Linsey at next available appt.   Patient and daughter verbalized understanding. Follow up appointment with Dr Oval Linsey rescheduled for 08/21/15. Pt advised to call us if she experiences any new of worsening symptoms and if life threatening emergency to call 911. Pt and daughter verbalized understanding.

## 2015-08-14 NOTE — Telephone Encounter (Signed)
Pt called in stated that she has been in a. Fib all weekend and her BP has been spiking at night and waking her up.  Pt stated she has been up the last 2 or 3 nights about 3 AM packing the floor because she feels funny and she is has headaches. She feels swimmy headed most times by day and is very tired from not resting well. She takes her medications as prescribed  Pt stated her BP has been 185/115 and starts to get little better when she gets up and moves around. Pt scheduled to come in office this morning at 10 AM for EKG. Pt instructed to have someone driver her since she does not feel right, pt verbalized understanding and no additional questions at this time.

## 2015-08-14 NOTE — Telephone Encounter (Signed)
Patient came in for nurse visit. Assessment and action as follows.  1.) Reason for visit: BP has been spiking at night and waking her up with a headache. This has happened the last 2 to 3 nights. It makes her feel tired after she gets up. BP has been as high as 185/115. She feels better when she gets up and moves around.  2.) Name of MD requesting visit: Visit scheduled from triage call.  3.) H&P: hx A fib, aortic valve replacement, HTN, hyperlipidemia.  4.) ROS related to problem: Pt brought in for EKG check. BP 118/62, pulse 60. Patient denies CP, SOB, palpitations, N/V or sweating. She says she has had leg/foot cramps some nights, she eats some mustard and it goes away. Pt ambulated with no problem to exam room.  5.) Assessment and plan per MD: These symptoms and findings presented to Dr Stanford Breed, DOD, along with EKG. He reviewed the EKG and determined it was similar to her last EKG.  He said no changes needed to be made to her medications today given her BP at this time and her age. He wanted her to follow up with Dr Oval Linsey at next available appt.   Patient and daughter verbalized understanding. Follow up appointment with Dr Oval Linsey rescheduled for 08/21/15. Pt advised to call us if she experiences any new of worsening symptoms and if life threatening emergency to call 911. Pt and daughter verbalized understanding.

## 2015-08-14 NOTE — Patient Instructions (Signed)
Medication Instructions:  Your physician recommends that you continue on your current medications as directed. Please refer to the Current Medication list given to you today.    Follow-Up: Follow up appointment with Dr Oval Linsey is scheduled for Aug 21, 2015 @ 9:45 am.   Any Other Special Instructions Will Be Listed Below (If Applicable).     If you need a refill on your cardiac medications before your next appointment, please call your pharmacy.

## 2015-08-17 ENCOUNTER — Other Ambulatory Visit (HOSPITAL_BASED_OUTPATIENT_CLINIC_OR_DEPARTMENT_OTHER): Payer: Medicare Other

## 2015-08-17 DIAGNOSIS — I481 Persistent atrial fibrillation: Secondary | ICD-10-CM

## 2015-08-17 DIAGNOSIS — D689 Coagulation defect, unspecified: Secondary | ICD-10-CM

## 2015-08-17 DIAGNOSIS — I4819 Other persistent atrial fibrillation: Secondary | ICD-10-CM

## 2015-08-17 DIAGNOSIS — D500 Iron deficiency anemia secondary to blood loss (chronic): Secondary | ICD-10-CM

## 2015-08-17 DIAGNOSIS — D62 Acute posthemorrhagic anemia: Secondary | ICD-10-CM

## 2015-08-17 DIAGNOSIS — K921 Melena: Secondary | ICD-10-CM

## 2015-08-17 LAB — CBC WITH DIFFERENTIAL/PLATELET
BASO%: 0.6 % (ref 0.0–2.0)
BASOS ABS: 0 10*3/uL (ref 0.0–0.1)
EOS ABS: 0.1 10*3/uL (ref 0.0–0.5)
EOS%: 1.2 % (ref 0.0–7.0)
HEMATOCRIT: 34.3 % — AB (ref 34.8–46.6)
HGB: 11.5 g/dL — ABNORMAL LOW (ref 11.6–15.9)
LYMPH%: 11.2 % — AB (ref 14.0–49.7)
MCH: 32.1 pg (ref 25.1–34.0)
MCHC: 33.5 g/dL (ref 31.5–36.0)
MCV: 95.8 fL (ref 79.5–101.0)
MONO#: 0.5 10*3/uL (ref 0.1–0.9)
MONO%: 9.8 % (ref 0.0–14.0)
NEUT%: 77.2 % — AB (ref 38.4–76.8)
NEUTROS ABS: 3.9 10*3/uL (ref 1.5–6.5)
PLATELETS: 189 10*3/uL (ref 145–400)
RBC: 3.58 10*6/uL — AB (ref 3.70–5.45)
RDW: 18.4 % — ABNORMAL HIGH (ref 11.2–14.5)
WBC: 5.1 10*3/uL (ref 3.9–10.3)
lymph#: 0.6 10*3/uL — ABNORMAL LOW (ref 0.9–3.3)
nRBC: 0 % (ref 0–0)

## 2015-08-17 LAB — PROTIME-INR
INR: 2.9 (ref 2.00–3.50)
Protime: 34.8 Seconds — ABNORMAL HIGH (ref 10.6–13.4)

## 2015-08-18 NOTE — Progress Notes (Signed)
d   Cardiology Office Note   Date:  08/21/2015   ID:  Deborah [name], DOB 22-Oct-1926, MRN [id number]  PCP:  Mathews Argyle, MD  Cardiologist:   Skeet Latch, MD   Chief Complaint  Patient presents with  . Hypertension    blood pressure up and down      History of Present Illness: Deborah [name] is a 82 y.o. female with hypertension, hyperlipidemia, chronic atrial fibrillation, stroke, hypothyroidism, prior mechanical aortic valve replacement, and NSVT who presents for follow-up.  Deborah [name] as previously a patient of Dr. Mare Ferrari.  Deborah [name] previously developed a retroperitoneal hematoma on warfarin in September 2006 and was temporarily switched to Lovenox. However she was transitioned back to warfarin. Her anticoagulation is managed by her oncologist, Dr. Doreen Salvage.  Her INR goal is 2-2.5. She continues following up with her oncologist for management of her anemia and requires infusion of IV iron. She had a Lexiscan C ardiolite 04/2015 that was negative for ischemia. Her echo in January showed mild to moderate aortic stenosis across her mechanical aortic valve.    Deborah [name] underwent upper endoscopy on 07/25/15 that showed a small hiatal hernia.  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  She had no bleeding for one week but it has recurred.  She last had a transfusion 3 weeks ago.   At her last appointment Deborah [name] reported high blood pressure readings and headaches that awaken her from sleep.  She was asked to start taking losartan and HCTZ at night rather than bid.  She slept better but noted that her BP started spiking in the afternoon instead.  Most of her BP readings have been in the 409W-119J systolic.  When her BP spikes she notes nausea and GI upset.  She also feels anxious and jittery.  She has not noted any chest pain or shortness of breath.  She also denies lower extremity edema, orthopnea or  PND.  Past Medical History  Diagnosis Date  . Hypothyroidism   . Stomach problems     seeing Dr Watt Climes  . Hypertension   . GERD (gastroesophageal reflux disease)   . Anemia   . Arrhythmia     Atrial Fibrillation  . Breast cancer ([place])     "right; S/P lumpectomy, chemo, XRT"   . High cholesterol   . Heart murmur   . History of blood transfusion 1996; ~ 2013    "related to valve replacement; GI bleeding"   . Chronic lower GI bleeding   . Arthritis     "fingers, toes, hips; qwhere" (05/19/2013)  . Gout   . Pneumonia 07/2011  . Rectus sheath hematoma 12/19/14     held anticoagulation for 1 week, transfused  . TIA (transient ischemic attack) 12/24/14    symptoms resolved  . History of nuclear stress test     Myoview 1/17: EF 59%, normal perfusion, low risk  . History of echocardiogram     Echo 1/17: mild LVH, EF 50-55%, no RWMA, mild AI, mechanical AVR with mean 21 mmHg/peak 42 mmHg (stable since 2012), mod MR, mod to severe BAE, PASP 36 mmHg  . Complication of anesthesia     "once I'm awakened, I don't sleep til the following night"    Past Surgical History  Procedure Laterality Date  . Arteriogram  01/12    NO RAS   . Cardioversion      X 2  . Cardiac catheterization    .  Cataract extraction w/ intraocular lens  implant, bilateral Bilateral     bilateral cataract with lens implants  . Cholecystectomy    . Abdominal hysterectomy    . Appendectomy    . Colonoscopy with propofol N/A 11/24/2012    Procedure: COLONOSCOPY WITH PROPOFOL;  Surgeon: Jeryl Columbia, MD;  Location: [facility] ENDOSCOPY;  Service: Endoscopy;  Laterality: N/A;  . Breast biopsy Right   . Breast lumpectomy Right   . Aortic valve replacement  1996    [employer]  . Esophagogastroduodenoscopy (egd) with propofol N/A 07/25/2015    Procedure: ESOPHAGOGASTRODUODENOSCOPY (EGD) WITH PROPOFOL;  Surgeon: Clarene Essex, MD;  Location: [facility] ENDOSCOPY;  Service: Endoscopy;  Laterality: N/A;  . Hot hemostasis N/A 07/25/2015     Procedure: HOT HEMOSTASIS (ARGON PLASMA COAGULATION/BICAP);  Surgeon: Clarene Essex, MD;  Location: [facility] ENDOSCOPY;  Service: Endoscopy;  Laterality: N/A;     Current Outpatient Prescriptions  Medication Sig Dispense Refill  . acetaminophen (TYLENOL) 500 MG tablet Take 1,000 mg by mouth every 6 (six) hours as needed for mild pain, moderate pain or headache.     . allopurinol (ZYLOPRIM) 300 MG tablet Take 300 mg by mouth daily.    Marland Kitchen amLODipine (NORVASC) 5 MG tablet Take 5 mg by mouth daily.     . calcium-vitamin D 500 MG tablet Take 1 tablet by mouth 2 (two) times daily. Reported on 08/14/2015    . celecoxib (CELEBREX) 200 MG capsule Take 200 mg by mouth every other day.   4  . cloNIDine (CATAPRES) 0.1 MG tablet Take 0.1 mg by mouth as directed. 1 BY MOUTH DAILY AND DISCONTINUE STARTING 08/25/15    . dicyclomine (BENTYL) 10 MG capsule Take 10 mg by mouth daily as needed for spasms.     . digoxin (LANOXIN) 0.125 MG tablet Take 0.125 mg by mouth daily.    . fexofenadine (ALLEGRA ALLERGY) 180 MG tablet Take 180 mg by mouth daily as needed for allergies.     . hydrochlorothiazide (HYDRODIURIL) 25 MG tablet Take 1 tablet (25 mg total) by mouth every evening. 90 tablet 3  . levothyroxine (SYNTHROID, LEVOTHROID) 88 MCG tablet Take 88 mcg by mouth daily before breakfast.    . losartan (COZAAR) 50 MG tablet Take 50 mg by mouth 2 (two) times daily.    . multivitamin (THERAGRAN) per tablet Take 1 tablet by mouth daily.     Marland Kitchen NITROSTAT 0.4 MG SL tablet Place 0.4 mg under the tongue every 5 (five) minutes as needed for chest pain (x 3 doses).   1  . simvastatin (ZOCOR) 20 MG tablet TAKE ONE-HALF TABLET DAILY 30 tablet 3  . spironolactone (ALDACTONE) 25 MG tablet Take 25 mg by mouth 2 (two) times daily.  5  . warfarin (COUMADIN) 5 MG tablet TAKE ONE TABLET BY MOUTH ONCE DAILY 30 tablet 3  . zolpidem (AMBIEN CR) 12.5 MG CR tablet Take 12.5 mg by mouth at bedtime as needed for sleep.    . carvedilol (COREG) 12.5 MG  tablet Take 1 tablet (12.5 mg total) by mouth 2 (two) times daily. 180 tablet 1   No current facility-administered medications for this visit.    Allergies:   Amiodarone; Alendronate sodium; Macrodantin; Meclizine; Norvasc; and Tussionex pennkinetic er    Social History:  The patient  reports that she quit smoking about 27 years ago. Her smoking use included Cigarettes. She has a 20 pack-year smoking history. She has never used smokeless tobacco. She reports that she drinks about  4.8 oz of alcohol per week. She reports that she does not use illicit drugs.   Family History:  The patient's family history includes Breast cancer in her daughter; COPD in her brother; Colon cancer in her sister; Coronary artery disease in her father; Dementia in her mother; Liver cancer in her sister; Lung cancer in her brother.    ROS:  Please see the history of present illness.   Otherwise, review of systems are positive for none.   All other systems are reviewed and negative.    PHYSICAL EXAM: VS:  BP 140/68 mmHg  Pulse 88  Ht 5' (1.524 m)  Wt 54.341 kg (119 lb 12.8 oz)  BMI 23.40 kg/m2 , BMI Body mass index is 23.4 kg/(m^2). GENERAL:  Well appearing HEENT:  Pupils equal round and reactive, fundi not visualized, oral mucosa unremarkable.  Conjuntival pallor.  NECK:  No jugular venous distention, waveform within normal limits, carotid upstroke brisk and symmetric, no bruits, no thyromegaly LYMPHATICS:  No cervical adenopathy LUNGS:  Clear to auscultation bilaterally HEART:  RRR.  PMI not displaced or sustained,S1 within normal limits, mechanical S2, no S3, no S4, no clicks, no rubs, II/VI systolic murmur. ABD:  Flat, positive bowel sounds normal in frequency in pitch, no bruits, no rebound, no guarding, no midline pulsatile mass, no hepatomegaly, no splenomegaly EXT:  2 plus pulses throughout, no edema, no cyanosis no clubbing SKIN:  No rashes no nodules NEURO:  Cranial nerves II through XII grossly  intact, motor grossly intact throughout PSYCH:  Cognitively intact, oriented to person place and time    EKG:  EKG is not ordered today. The ekg ordered 06/30/15 demonstrates atrial bigeminy.  Rate 75 bpm.  Prior septal infarct.  Lateral ST-T changes consistent with ischemia.    Recent Labs: 12/25/2014: B Natriuretic Peptide 523.8* 07/28/2015: ALT 17 08/05/2015: BUN 29*; Creatinine, Ser 0.91; Potassium 4.4; Sodium 130* 08/17/2015: HGB 11.5*; Platelets 189    Lipid Panel    Component Value Date/Time   CHOL 138 12/26/2014 0356   TRIG 94 12/26/2014 0356   HDL 55 12/26/2014 0356   CHOLHDL 2.5 12/26/2014 0356   VLDL 19 12/26/2014 0356   LDLCALC 64 12/26/2014 0356   LDLDIRECT 93.7 04/10/2011 1000      Wt Readings from Last 3 Encounters:  08/21/15 54.341 kg (119 lb 12.8 oz)  08/14/15 53.978 kg (119 lb)  07/17/15 55.52 kg (122 lb 6.4 oz)      ASSESSMENT AND PLAN:  # Hypertension: Deborah [name] blood pressure is well-controlled today.  However, her readings have been high at home and she is having to take clonidine intermittently.  We discussed the fact that clonidine can cause rebound hypertension.  It is also making her drowsy and dry.  She developed LE edema with higher doses of amlodipine.  She will take clonidine 0.'1mg'$  daily for 3 days and then stop it.  We will start carvedilol 12.'5mg'$  bid.  Continue amldopine 5 mg daily, spironolactone 25 mg daily, losartan 50 mg bid and HCTZ 25 mg daily.    # Atrial fibrillation: Start carvedilol 12.5 mg bid. Continue anticoagulation with warfarin, INR goal 2-2.5.  # s/p mechanical AVR: Stable with moderately increased gradients.  INR goal 2-2.5 due to GI bleeding requiring transfusions.    Current medicines are reviewed at length with the patient today.  The patient does not have concerns regarding medicines.  The following changes have been made:  Stop clonidine and start carvedilol 12.5 mg bid.  Labs/  tests ordered today include:   No  orders of the defined types were placed in this encounter.     Disposition:   FU with Makaveli Hoard C. Oval Linsey, MD, [facility] in 1 month.   This note was written with the assistance of speech recognition software.  Please excuse any transcriptional errors.  Signed, Altonio Schwertner C. Oval Linsey, MD, [facility]  08/21/2015 10:11 AM    [place]

## 2015-08-21 ENCOUNTER — Ambulatory Visit (INDEPENDENT_AMBULATORY_CARE_PROVIDER_SITE_OTHER): Payer: Medicare Other | Admitting: Cardiovascular Disease

## 2015-08-21 ENCOUNTER — Encounter: Payer: Self-pay | Admitting: Cardiovascular Disease

## 2015-08-21 VITALS — BP 140/68 | HR 88 | Ht 60.0 in | Wt 119.8 lb

## 2015-08-21 DIAGNOSIS — I480 Paroxysmal atrial fibrillation: Secondary | ICD-10-CM | POA: Diagnosis not present

## 2015-08-21 DIAGNOSIS — I6330 Cerebral infarction due to thrombosis of unspecified cerebral artery: Secondary | ICD-10-CM

## 2015-08-21 DIAGNOSIS — Z954 Presence of other heart-valve replacement: Secondary | ICD-10-CM

## 2015-08-21 DIAGNOSIS — I10 Essential (primary) hypertension: Secondary | ICD-10-CM

## 2015-08-21 DIAGNOSIS — Z952 Presence of prosthetic heart valve: Secondary | ICD-10-CM

## 2015-08-21 MED ORDER — CARVEDILOL 12.5 MG PO TABS: 12.5000 mg | ORAL_TABLET | Freq: Two times a day (BID) | ORAL | Status: DC

## 2015-08-21 NOTE — Patient Instructions (Addendum)
Medication Instructions:  TAKE CLONIDINE 0.1 MG DAILY UNTIL Thursday STOP COMPLETELY ON Friday  START COREG (CARVEDILOL) 12.5 MG TWICE A DAY  Labwork: NONE  Testing/Procedures: NONE  Follow-Up: Your physician recommends that you schedule a follow-up appointment in: [place]  If you need a refill on your cardiac medications before your next appointment, please call your pharmacy.

## 2015-08-23 ENCOUNTER — Telehealth: Payer: Self-pay | Admitting: Cardiovascular Disease

## 2015-08-23 NOTE — Telephone Encounter (Signed)
Dr Sallyanne Kuster recommended that pt discontinue clonidine, take her coreg today as prescribed, do not take amlodipine, losartan, and HCTZ today only, and to please call us back with updated BP and status.  Called Deborah [name] back at [facility] and gave him the recommendations. He verbalized understanding and said someone will call later with the updated BP.

## 2015-08-23 NOTE — Telephone Encounter (Signed)
Received call from Sam at [facility] about the pt.  Her BP was 108/50, pulse 52 and pt feels dizzy and uneasy. Denies SOB, nausea, sweating, CP, or palpitations. About 5 min later had Sam retake BP. It was 118/58, pulse 52. Pt still says she feels dizzy and uneasy. Pt has not taken any of her daily medications today yet.  Will take to Dr Sallyanne Kuster for advice and get back to Sam at [facility].

## 2015-08-23 NOTE — Telephone Encounter (Signed)
Sam is calling in to discuss the pt's BP , he says it is running low and he is concerned.

## 2015-08-23 NOTE — Telephone Encounter (Signed)
Please call,she needs to talk to you about her blood pressure.l

## 2015-08-23 NOTE — Telephone Encounter (Signed)
New message     Pt c/o BP issue: STAT if pt c/o blurred vision, one-sided weakness or slurred speech  1. What are your last 5 BP readings? 5/24 @ 1530 122/60 manuel and pulse 52  2. Are you having any other symptoms (ex. Dizziness, headache, blurred vision, passed out)? Dizziness per nurse at the assist living facility  3. What is your BP issue? The nurse is asking is it ok for the pt take Coreg medication or Labetalol this evening since the pt's pulse has been low.

## 2015-08-23 NOTE — Telephone Encounter (Signed)
SPOKE  TO NURSE AT  NURSING  FACILITY PATIENT LIVES - [facility]DEP[facility]IV[facility] PATIENT IS ONLY TAKING  COREG , NOT LABETALOL  PER DR CROITORU OKAY TO TAKE COREG TONIGHT. CALL TOMORROW IF HEART RATE IS THE SAME - NURSES STATES SHE WILL INFORM PATIENT SINCE PATIENT LIVES INDEPENDENTLY.

## 2015-08-24 ENCOUNTER — Telehealth: Payer: Self-pay | Admitting: Oncology

## 2015-08-24 ENCOUNTER — Ambulatory Visit (HOSPITAL_COMMUNITY)
Admission: RE | Admit: 2015-08-24 | Discharge: 2015-08-24 | Disposition: A | Discharge status: Home / Self Care | Payer: Medicare Other | Source: Ambulatory Visit | Attending: Oncology | Admitting: Oncology

## 2015-08-24 ENCOUNTER — Other Ambulatory Visit (HOSPITAL_BASED_OUTPATIENT_CLINIC_OR_DEPARTMENT_OTHER): Payer: Medicare Other

## 2015-08-24 ENCOUNTER — Other Ambulatory Visit: Payer: Self-pay

## 2015-08-24 ENCOUNTER — Ambulatory Visit: Payer: Medicare Other

## 2015-08-24 VITALS — BP 144/61 | HR 64 | Temp 97.8°F | Resp 20

## 2015-08-24 DIAGNOSIS — D500 Iron deficiency anemia secondary to blood loss (chronic): Secondary | ICD-10-CM | POA: Insufficient documentation

## 2015-08-24 DIAGNOSIS — D689 Coagulation defect, unspecified: Secondary | ICD-10-CM

## 2015-08-24 DIAGNOSIS — I481 Persistent atrial fibrillation: Secondary | ICD-10-CM

## 2015-08-24 DIAGNOSIS — D62 Acute posthemorrhagic anemia: Secondary | ICD-10-CM

## 2015-08-24 DIAGNOSIS — K921 Melena: Secondary | ICD-10-CM

## 2015-08-24 DIAGNOSIS — I4819 Other persistent atrial fibrillation: Secondary | ICD-10-CM

## 2015-08-24 LAB — CBC WITH DIFFERENTIAL/PLATELET
BASO%: 0.4 % (ref 0.0–2.0)
BASOS ABS: 0 10*3/uL (ref 0.0–0.1)
EOS%: 0.8 % (ref 0.0–7.0)
Eosinophils Absolute: 0 10*3/uL (ref 0.0–0.5)
HCT: 26.4 % — ABNORMAL LOW (ref 34.8–46.6)
HEMOGLOBIN: 8.7 g/dL — AB (ref 11.6–15.9)
LYMPH%: 11.7 % — ABNORMAL LOW (ref 14.0–49.7)
MCH: 32.3 pg (ref 25.1–34.0)
MCHC: 33 g/dL (ref 31.5–36.0)
MCV: 98.1 fL (ref 79.5–101.0)
MONO#: 0.3 10*3/uL (ref 0.1–0.9)
MONO%: 5.2 % (ref 0.0–14.0)
NEUT#: 3.9 10*3/uL (ref 1.5–6.5)
NEUT%: 81.9 % — ABNORMAL HIGH (ref 38.4–76.8)
Platelets: 172 10*3/uL (ref 145–400)
RBC: 2.69 10*6/uL — ABNORMAL LOW (ref 3.70–5.45)
RDW: 18 % — AB (ref 11.2–14.5)
WBC: 4.8 10*3/uL (ref 3.9–10.3)
lymph#: 0.6 10*3/uL — ABNORMAL LOW (ref 0.9–3.3)

## 2015-08-24 LAB — PROTIME-INR
INR: 1.7 — AB (ref 2.00–3.50)
PROTIME: 20.4 s — AB (ref 10.6–13.4)

## 2015-08-24 LAB — PREPARE RBC (CROSSMATCH)

## 2015-08-24 LAB — FERRITIN: Ferritin: 148 ng/ml (ref 9–269)

## 2015-08-24 MED ORDER — SODIUM CHLORIDE 0.9% FLUSH
10.0000 mL | INTRAVENOUS | Status: AC | PRN
  Administered 2015-08-24: 10 mL

## 2015-08-24 MED ORDER — DIPHENHYDRAMINE HCL 25 MG PO CAPS
25.0000 mg | ORAL_CAPSULE | Freq: Once | ORAL | Status: AC
  Administered 2015-08-24: 25 mg via ORAL
  Filled 2015-08-24: qty 1

## 2015-08-24 MED ORDER — HEPARIN SOD (PORK) LOCK FLUSH 100 UNIT/ML IV SOLN
500.0000 [IU] | Freq: Every day | INTRAVENOUS | Status: AC | PRN
  Administered 2015-08-24: 500 [IU]
  Filled 2015-08-24: qty 5

## 2015-08-24 MED ORDER — SODIUM CHLORIDE 0.9 % IV SOLN
250.0000 mL | Freq: Once | INTRAVENOUS | Status: AC
  Administered 2015-08-24: 250 mL via INTRAVENOUS

## 2015-08-24 MED ORDER — ACETAMINOPHEN 325 MG PO TABS
650.0000 mg | ORAL_TABLET | Freq: Once | ORAL | Status: AC
  Administered 2015-08-24: 650 mg via ORAL
  Filled 2015-08-24: qty 2

## 2015-08-24 MED ORDER — HEPARIN SOD (PORK) LOCK FLUSH 100 UNIT/ML IV SOLN: 250.0000 [IU] | INTRAVENOUS | Status: DC | PRN

## 2015-08-24 NOTE — Telephone Encounter (Signed)
Pt called back w updated BP and status as related to hypotensive event on 5/24. Recommendations had been given by Dr. Sallyanne Kuster ([facility]). She notes she followed med instructions -- held clonidine, amlodipine, losartan, and HCTZ yesterday.  Checked BP at home today and was 146/55, w HR of 60. Reports feeling OK today - no dizziness or symptoms from yesterday. Advised taking all scheduled meds except those held yesterday - continue to hold until physician instruction.   Pt visits cancer center for appt today at 34 and notes she will be there all day. If returning call contact her cell phone.

## 2015-08-24 NOTE — Telephone Encounter (Signed)
Returned patient call. Pt undergoing blood transfusion at present. I communicated med changes verbally and also spoke to tranfusion RN who was kind enough to RBV and write instructions for patient's med changes. Pt aware to call if she needs re-address/clarification of instructions. Pt also set up for BP visit. She is aware to call if new concerns in interim. Pt voiced thanks for the call.

## 2015-08-24 NOTE — Telephone Encounter (Signed)
lvm to inform patient of 6/1 lab appt at 1030 am

## 2015-08-24 NOTE — Telephone Encounter (Signed)
Deborah [name] is calling to find out what medication will she need to take today concerning her bp , or do she need to come in to see her . Please call   Thanks

## 2015-08-24 NOTE — Discharge Instructions (Signed)

## 2015-08-24 NOTE — Progress Notes (Signed)
INR today- 1.7 Present dose - 5 mg Dr. Jana Hakim reviewed and patient is to stay on the same dose Repeat lab in one week 08/31/15  Patient being transfused with two units of blood in Sickle Cell today.  HGB- 8.7 Patient reports "very black stools" Per Dr. Jana Hakim a ferritin should be drawn which it was.

## 2015-08-24 NOTE — Telephone Encounter (Signed)
Called cell phone as instructed by patient, goes straight to VM. Left msg to call and have operator connect her to triage room.  Per Dr. Blenda Mounts written instruction, patient should reduce coreg dose to 6.'25mg'$  BID and: -hold AM BP meds -check BP tonight - if >140/90, take PM BP meds -tomorrow, stop clonidine and take other meds as ordered.  Pt should also be set up to see Kristin/Kelly w/in next 2 weeks for BP visit.

## 2015-08-24 NOTE — Telephone Encounter (Signed)
Follow up    Patient returning a call back to nurse

## 2015-08-24 NOTE — Progress Notes (Signed)
Medical Provider: Virgie Dad. Magrinat, MD   Iron deficiency anemia due to chronic blood loss - Primary  Procedure: Transfused 2 units PRBC's without reaction or adverse effects.  Went over discharge instructions and copy given to patient. Alert, ambulatory and oriented at time of discharge.   Discharged to home with daughter.

## 2015-08-25 LAB — TYPE AND SCREEN
ABO/RH(D): O POS
ANTIBODY SCREEN: NEGATIVE
UNIT DIVISION: 0
Unit division: 0

## 2015-08-30 ENCOUNTER — Ambulatory Visit: Payer: Medicare Other | Admitting: Cardiovascular Disease

## 2015-08-31 ENCOUNTER — Other Ambulatory Visit (HOSPITAL_BASED_OUTPATIENT_CLINIC_OR_DEPARTMENT_OTHER): Payer: Medicare Other

## 2015-08-31 DIAGNOSIS — I481 Persistent atrial fibrillation: Secondary | ICD-10-CM | POA: Diagnosis present

## 2015-08-31 DIAGNOSIS — D689 Coagulation defect, unspecified: Secondary | ICD-10-CM

## 2015-08-31 DIAGNOSIS — I4819 Other persistent atrial fibrillation: Secondary | ICD-10-CM

## 2015-08-31 DIAGNOSIS — D500 Iron deficiency anemia secondary to blood loss (chronic): Secondary | ICD-10-CM

## 2015-08-31 DIAGNOSIS — D62 Acute posthemorrhagic anemia: Secondary | ICD-10-CM

## 2015-08-31 DIAGNOSIS — K921 Melena: Secondary | ICD-10-CM

## 2015-08-31 LAB — CBC WITH DIFFERENTIAL/PLATELET
BASO%: 0.2 % (ref 0.0–2.0)
Basophils Absolute: 0 10*3/uL (ref 0.0–0.1)
EOS ABS: 0.1 10*3/uL (ref 0.0–0.5)
EOS%: 1.6 % (ref 0.0–7.0)
HEMATOCRIT: 35 % (ref 34.8–46.6)
HEMOGLOBIN: 11.7 g/dL (ref 11.6–15.9)
LYMPH#: 0.8 10*3/uL — AB (ref 0.9–3.3)
LYMPH%: 16.5 % (ref 14.0–49.7)
MCH: 32.1 pg (ref 25.1–34.0)
MCHC: 33.4 g/dL (ref 31.5–36.0)
MCV: 96.2 fL (ref 79.5–101.0)
MONO#: 0.4 10*3/uL (ref 0.1–0.9)
MONO%: 8.1 % (ref 0.0–14.0)
NEUT%: 73.6 % (ref 38.4–76.8)
NEUTROS ABS: 3.8 10*3/uL (ref 1.5–6.5)
NRBC: 0 % (ref 0–0)
PLATELETS: 215 10*3/uL (ref 145–400)
RBC: 3.64 10*6/uL — ABNORMAL LOW (ref 3.70–5.45)
RDW: 17.3 % — ABNORMAL HIGH (ref 11.2–14.5)
WBC: 5.1 10*3/uL (ref 3.9–10.3)

## 2015-08-31 LAB — PROTIME-INR
INR: 3.1 (ref 2.00–3.50)
Protime: 37.2 Seconds — ABNORMAL HIGH (ref 10.6–13.4)

## 2015-09-07 ENCOUNTER — Telehealth: Payer: Self-pay | Admitting: Oncology

## 2015-09-07 ENCOUNTER — Ambulatory Visit (INDEPENDENT_AMBULATORY_CARE_PROVIDER_SITE_OTHER): Payer: Medicare Other | Admitting: Pharmacist Clinician (PhC)/ Clinical Pharmacy Specialist

## 2015-09-07 ENCOUNTER — Other Ambulatory Visit: Payer: Self-pay | Admitting: *Deleted

## 2015-09-07 ENCOUNTER — Other Ambulatory Visit (HOSPITAL_BASED_OUTPATIENT_CLINIC_OR_DEPARTMENT_OTHER): Payer: Medicare Other

## 2015-09-07 ENCOUNTER — Telehealth: Payer: Self-pay | Admitting: *Deleted

## 2015-09-07 VITALS — BP 156/84 | HR 72 | Ht 60.0 in | Wt 121.4 lb

## 2015-09-07 DIAGNOSIS — D62 Acute posthemorrhagic anemia: Secondary | ICD-10-CM

## 2015-09-07 DIAGNOSIS — I10 Essential (primary) hypertension: Secondary | ICD-10-CM | POA: Diagnosis not present

## 2015-09-07 DIAGNOSIS — K921 Melena: Secondary | ICD-10-CM

## 2015-09-07 DIAGNOSIS — I6330 Cerebral infarction due to thrombosis of unspecified cerebral artery: Secondary | ICD-10-CM

## 2015-09-07 DIAGNOSIS — D500 Iron deficiency anemia secondary to blood loss (chronic): Secondary | ICD-10-CM

## 2015-09-07 DIAGNOSIS — D689 Coagulation defect, unspecified: Secondary | ICD-10-CM

## 2015-09-07 DIAGNOSIS — I4819 Other persistent atrial fibrillation: Secondary | ICD-10-CM

## 2015-09-07 DIAGNOSIS — I481 Persistent atrial fibrillation: Secondary | ICD-10-CM | POA: Diagnosis not present

## 2015-09-07 LAB — CBC WITH DIFFERENTIAL/PLATELET
BASO%: 0.2 % (ref 0.0–2.0)
BASOS ABS: 0 10*3/uL (ref 0.0–0.1)
EOS%: 1.4 % (ref 0.0–7.0)
Eosinophils Absolute: 0.1 10*3/uL (ref 0.0–0.5)
HEMATOCRIT: 35.5 % (ref 34.8–46.6)
HEMOGLOBIN: 11.7 g/dL (ref 11.6–15.9)
LYMPH#: 0.7 10*3/uL — AB (ref 0.9–3.3)
LYMPH%: 14.7 % (ref 14.0–49.7)
MCH: 31.5 pg (ref 25.1–34.0)
MCHC: 33 g/dL (ref 31.5–36.0)
MCV: 95.7 fL (ref 79.5–101.0)
MONO#: 0.4 10*3/uL (ref 0.1–0.9)
MONO%: 8.3 % (ref 0.0–14.0)
NEUT#: 3.7 10*3/uL (ref 1.5–6.5)
NEUT%: 75.4 % (ref 38.4–76.8)
NRBC: 0 % (ref 0–0)
Platelets: 205 10*3/uL (ref 145–400)
RBC: 3.71 10*6/uL (ref 3.70–5.45)
RDW: 16.2 % — AB (ref 11.2–14.5)
WBC: 5 10*3/uL (ref 3.9–10.3)

## 2015-09-07 LAB — PROTIME-INR
INR: 2.8 (ref 2.00–3.50)
PROTIME: 33.6 s — AB (ref 10.6–13.4)

## 2015-09-07 MED ORDER — VALSARTAN 320 MG PO TABS: 320.0000 mg | ORAL_TABLET | Freq: Every day | ORAL | Status: DC

## 2015-09-07 NOTE — Telephone Encounter (Signed)
appt made per 6/8 pof. Pt aware of date/time

## 2015-09-07 NOTE — Telephone Encounter (Signed)
INR 2.8. Discussed with Dr. Jana Hakim. Patient advised to take 2.5 mg daily except for Saturday and Wednesday 5 mg. Patient verbalized understanding. pof sent to recheck on 6/21.

## 2015-09-07 NOTE — Assessment & Plan Note (Signed)
Blood pressure slightly elevated in the office today at 154/84.  I am going to switch her losartan 50 mg bid to valsartan 320 mg once daily.  She is to continue with all of her other medications.  Will see her back in 3 weeks for follow up.

## 2015-09-07 NOTE — Patient Instructions (Signed)
Return for a a follow up appointment in 3 month  Your blood pressure today is 156/84 (goal is < 150/90)  Check your blood pressure at home several times each week and keep record of the readings.  Take your BP meds as follows: stop losartan and start valsartan 320 mg once daily.  Continue with all other medications  Bring all of your meds, your BP cuff and your record of home blood pressures to your next appointment.  Exercise as you're able, try to walk approximately 30 minutes per day.  Keep salt intake to a minimum, especially watch canned and prepared boxed foods.  Eat more fresh fruits and vegetables and fewer canned items.  Avoid eating in fast food restaurants.    HOW TO TAKE YOUR BLOOD PRESSURE: . Rest 5 minutes before taking your blood pressure. .  Don't smoke or drink caffeinated beverages for at least 30 minutes before. . Take your blood pressure before (not after) you eat. . Sit comfortably with your back supported and both feet on the floor (don't cross your legs). . Elevate your arm to heart level on a table or a desk. . Use the proper sized cuff. It should fit smoothly and snugly around your bare upper arm. There should be enough room to slip a fingertip under the cuff. The bottom edge of the cuff should be 1 inch above the crease of the elbow. . Ideally, take 3 measurements at one sitting and record the average.

## 2015-09-07 NOTE — Progress Notes (Signed)
09/07/2015 Deborah [name] 23-Jul-1926 [id number]   HPI:  Deborah [name] is a 82 y.o. female patient of Dr Oval Linsey, with a [place] below who presents today for hypertension clinic evaluation.  Blood Pressure Goal:  150/90   Current Medications: hydrochlorothiazide 25 mg bid (?pt states takes bid?), losartan 50 mg bid, carvedilol 12.5 mg bid, amlodipine 5 mg qhs;, spironolactone 12.5 mg bid  Cardiac Hx: hypertension, hyperlipidemia, chronic AF, stroke, mechanical AVR, hypothyroidism  Family Hx: father died from MI at 54  Social Hx: no tobacco (quit in 1970's), drinks 1 mixed drink each night, occasional coffee  Diet: still cooks most of her meals despite living at [employer], eats in the dining room 1-2 times per week; only uses light salt on some foods  Exercise: does stretching exercises daily, walks daily and stays active throughout the day  Home BP readings: has home cuff, but doesn't use.  Also can get checked daily by RN at [facility]: amlodipine causes swelling if takes 10 mg  Wt Readings from Last 3 Encounters:  09/07/15 121 lb 6.4 oz (55.067 kg)  08/21/15 119 lb 12.8 oz (54.341 kg)  08/14/15 119 lb (53.978 kg)   BP Readings from Last 3 Encounters:  09/07/15 156/84  08/24/15 144/61  08/21/15 140/68   Pulse Readings from Last 3 Encounters:  09/07/15 72  08/24/15 64  08/21/15 88    Current Outpatient Prescriptions  Medication Sig Dispense Refill  . acetaminophen (TYLENOL) 500 MG tablet Take 1,000 mg by mouth every 6 (six) hours as needed for mild pain, moderate pain or headache.     . allopurinol (ZYLOPRIM) 300 MG tablet Take 300 mg by mouth daily.    Marland Kitchen amLODipine (NORVASC) 5 MG tablet Take 5 mg by mouth daily.     . calcium-vitamin D 500 MG tablet Take 1 tablet by mouth 2 (two) times daily. Reported on 08/14/2015    . carvedilol (COREG) 12.5 MG tablet Take 1 tablet (12.5 mg total) by mouth 2 (two) times daily. 180 tablet 1  . celecoxib (CELEBREX)  200 MG capsule Take 200 mg by mouth every other day.   4  . cloNIDine (CATAPRES) 0.1 MG tablet Take 0.1 mg by mouth as directed. 1 BY MOUTH DAILY AND DISCONTINUE STARTING 08/25/15    . dicyclomine (BENTYL) 10 MG capsule Take 10 mg by mouth daily as needed for spasms.     . digoxin (LANOXIN) 0.125 MG tablet Take 0.125 mg by mouth daily.    . fexofenadine (ALLEGRA ALLERGY) 180 MG tablet Take 180 mg by mouth daily as needed for allergies.     . hydrochlorothiazide (HYDRODIURIL) 25 MG tablet Take 1 tablet (25 mg total) by mouth every evening. 90 tablet 3  . levothyroxine (SYNTHROID, LEVOTHROID) 88 MCG tablet Take 88 mcg by mouth daily before breakfast.    . losartan (COZAAR) 50 MG tablet Take 50 mg by mouth 2 (two) times daily.    . multivitamin (THERAGRAN) per tablet Take 1 tablet by mouth daily.     Marland Kitchen NITROSTAT 0.4 MG SL tablet Place 0.4 mg under the tongue every 5 (five) minutes as needed for chest pain (x 3 doses).   1  . simvastatin (ZOCOR) 20 MG tablet TAKE ONE-HALF TABLET DAILY 30 tablet 3  . spironolactone (ALDACTONE) 25 MG tablet Take 25 mg by mouth 2 (two) times daily.  5  . warfarin (COUMADIN) 5 MG tablet TAKE ONE TABLET BY MOUTH ONCE DAILY 30 tablet 3  .  zolpidem (AMBIEN CR) 12.5 MG CR tablet Take 12.5 mg by mouth at bedtime as needed for sleep.     No current facility-administered medications for this visit.    Allergies  Allergen Reactions  . Amiodarone Shortness Of Breath  . Alendronate Sodium Other (See Comments)    GI upset  . Macrodantin [Nitrofurantoin] Other (See Comments)    GI upset  . Meclizine Swelling    tongue  . Norvasc [Amlodipine Besylate] Swelling    edema  . Tussionex Pennkinetic Er [Hydrocod Polst-Cpm Polst Er] Other (See Comments)    Reaction unknown    Past Medical History  Diagnosis Date  . Hypothyroidism   . Stomach problems     seeing Dr Watt Climes  . Hypertension   . GERD (gastroesophageal reflux disease)   . Anemia   . Arrhythmia     Atrial  Fibrillation  . Breast cancer ([place])     "right; S/P lumpectomy, chemo, XRT"   . High cholesterol   . Heart murmur   . History of blood transfusion 1996; ~ 2013    "related to valve replacement; GI bleeding"   . Chronic lower GI bleeding   . Arthritis     "fingers, toes, hips; qwhere" (05/19/2013)  . Gout   . Pneumonia 07/2011  . Rectus sheath hematoma 12/19/14     held anticoagulation for 1 week, transfused  . TIA (transient ischemic attack) 12/24/14    symptoms resolved  . History of nuclear stress test     Myoview 1/17: EF 59%, normal perfusion, low risk  . History of echocardiogram     Echo 1/17: mild LVH, EF 50-55%, no RWMA, mild AI, mechanical AVR with mean 21 mmHg/peak 42 mmHg (stable since 2012), mod MR, mod to severe BAE, PASP 36 mmHg  . Complication of anesthesia     "once I'm awakened, I don't sleep til the following night"    Blood pressure 156/84, pulse 72, height 5' (1.524 m), weight 121 lb 6.4 oz (55.067 kg).    Tommy Medal PharmD CPP [facility]

## 2015-09-13 ENCOUNTER — Telehealth: Payer: Self-pay | Admitting: Cardiovascular Disease

## 2015-09-13 NOTE — Telephone Encounter (Signed)
I spoke w/ patient. Notes her BP is 126/58 today. Patient notes she's a little concerned about this and wanted to know if safe to take her morning meds. Regarding the dizziness, she notes she has had off and on for weeks - this does not seem to be correlated to BP changes and she has had evaluation for this.  She was seen for BP evaluation recently and had followed recommended changes. However, when reviewing her med list with her, pt notes she is taking her HCTZ '25mg'$  twice daily.  I advised to hold the AM dose of this med, but to otherwise take her meds as previously instructed.  Patient is leaving for a trip tomorrow, and asked what to do if her BP runs low while she is gone. I did instruct that the BP she read to me is fine, but if low w/ symptoms (below [id number] systolic) to call and we can address further - would consider advice from pharmD on this, as she has had recent med adjustments.  Will route for review.

## 2015-09-13 NOTE — Telephone Encounter (Signed)
Agree with plan. She can start taking HCTZ only once daily as dose >'25mg'$ /day is likely not adding much benefit. Continue to monitor and call if pressures trend up with once daily dosing of HCTZ.

## 2015-09-13 NOTE — Telephone Encounter (Signed)
New message      Pt c/o BP issue: STAT if pt c/o blurred vision, one-sided weakness or slurred speech  1. What are your last 5 BP readings? 126/58 HR 59---before medication 2. Are you having any other symptoms (ex. Dizziness, headache, blurred vision, passed out)? Some dizziness 3. What is your BP issue? [facility] nurse took pts bp this am before she took her medication.  Should she still take her bp meds?  Please call pt at her home at [phone]

## 2015-09-20 ENCOUNTER — Ambulatory Visit (HOSPITAL_BASED_OUTPATIENT_CLINIC_OR_DEPARTMENT_OTHER): Payer: Medicare Other

## 2015-09-20 ENCOUNTER — Ambulatory Visit: Payer: Self-pay | Admitting: *Deleted

## 2015-09-20 ENCOUNTER — Ambulatory Visit (HOSPITAL_COMMUNITY)
Admission: RE | Admit: 2015-09-20 | Discharge: 2015-09-20 | Disposition: A | Discharge status: Home / Self Care | Payer: Medicare Other | Source: Ambulatory Visit | Attending: Oncology | Admitting: Oncology

## 2015-09-20 ENCOUNTER — Telehealth: Payer: Self-pay | Admitting: *Deleted

## 2015-09-20 ENCOUNTER — Other Ambulatory Visit (HOSPITAL_BASED_OUTPATIENT_CLINIC_OR_DEPARTMENT_OTHER): Payer: Medicare Other

## 2015-09-20 ENCOUNTER — Other Ambulatory Visit: Payer: Self-pay | Admitting: *Deleted

## 2015-09-20 VITALS — BP 107/49 | HR 72 | Temp 97.6°F | Resp 20

## 2015-09-20 DIAGNOSIS — I4819 Other persistent atrial fibrillation: Secondary | ICD-10-CM

## 2015-09-20 DIAGNOSIS — K922 Gastrointestinal hemorrhage, unspecified: Secondary | ICD-10-CM | POA: Diagnosis not present

## 2015-09-20 DIAGNOSIS — D62 Acute posthemorrhagic anemia: Secondary | ICD-10-CM

## 2015-09-20 DIAGNOSIS — D500 Iron deficiency anemia secondary to blood loss (chronic): Secondary | ICD-10-CM

## 2015-09-20 DIAGNOSIS — K921 Melena: Secondary | ICD-10-CM

## 2015-09-20 DIAGNOSIS — D649 Anemia, unspecified: Secondary | ICD-10-CM

## 2015-09-20 DIAGNOSIS — D689 Coagulation defect, unspecified: Secondary | ICD-10-CM

## 2015-09-20 DIAGNOSIS — I4891 Unspecified atrial fibrillation: Secondary | ICD-10-CM | POA: Diagnosis not present

## 2015-09-20 LAB — COMPREHENSIVE METABOLIC PANEL
ALT: 21 U/L (ref 0–55)
AST: 17 U/L (ref 5–34)
Albumin: 3.3 g/dL — ABNORMAL LOW (ref 3.5–5.0)
Alkaline Phosphatase: 37 U/L — ABNORMAL LOW (ref 40–150)
Anion Gap: 5 mEq/L (ref 3–11)
BUN: 39.1 mg/dL — AB (ref 7.0–26.0)
CALCIUM: 9.3 mg/dL (ref 8.4–10.4)
CHLORIDE: 104 meq/L (ref 98–109)
CO2: 23 mEq/L (ref 22–29)
Creatinine: 1.2 mg/dL — ABNORMAL HIGH (ref 0.6–1.1)
EGFR: 39 mL/min/{1.73_m2} — ABNORMAL LOW (ref >90)
Glucose: 103 mg/dl (ref 70–140)
POTASSIUM: 5.5 meq/L — AB (ref 3.5–5.1)
SODIUM: 132 meq/L — AB (ref 136–145)
Total Bilirubin: 0.32 mg/dL (ref 0.20–1.20)
Total Protein: 5.8 g/dL — ABNORMAL LOW (ref 6.4–8.3)

## 2015-09-20 LAB — CBC WITH DIFFERENTIAL/PLATELET
BASO%: 0.5 % (ref 0.0–2.0)
BASOS ABS: 0 10*3/uL (ref 0.0–0.1)
EOS ABS: 0 10*3/uL (ref 0.0–0.5)
EOS%: 0.7 % (ref 0.0–7.0)
HCT: 28.9 % — ABNORMAL LOW (ref 34.8–46.6)
HEMOGLOBIN: 9.3 g/dL — AB (ref 11.6–15.9)
LYMPH%: 10.7 % — ABNORMAL LOW (ref 14.0–49.7)
MCH: 31.9 pg (ref 25.1–34.0)
MCHC: 32.3 g/dL (ref 31.5–36.0)
MCV: 98.8 fL (ref 79.5–101.0)
MONO#: 0.3 10*3/uL (ref 0.1–0.9)
MONO%: 5.8 % (ref 0.0–14.0)
NEUT%: 82.3 % — ABNORMAL HIGH (ref 38.4–76.8)
NEUTROS ABS: 4.8 10*3/uL (ref 1.5–6.5)
PLATELETS: 153 10*3/uL (ref 145–400)
RBC: 2.92 10*6/uL — ABNORMAL LOW (ref 3.70–5.45)
RDW: 18.7 % — AB (ref 11.2–14.5)
WBC: 5.9 10*3/uL (ref 3.9–10.3)
lymph#: 0.6 10*3/uL — ABNORMAL LOW (ref 0.9–3.3)

## 2015-09-20 LAB — PREPARE RBC (CROSSMATCH)

## 2015-09-20 LAB — FERRITIN: Ferritin: 45 ng/ml (ref 9–269)

## 2015-09-20 LAB — PROTIME-INR
INR: 1.8 — AB (ref 2.00–3.50)
PROTIME: 21.6 s — AB (ref 10.6–13.4)

## 2015-09-20 MED ORDER — ACETAMINOPHEN 325 MG PO TABS
650.0000 mg | ORAL_TABLET | Freq: Once | ORAL | Status: AC
  Administered 2015-09-20: 650 mg via ORAL

## 2015-09-20 MED ORDER — DIPHENHYDRAMINE HCL 25 MG PO CAPS
25.0000 mg | ORAL_CAPSULE | Freq: Once | ORAL | Status: AC
  Administered 2015-09-20: 25 mg via ORAL

## 2015-09-20 MED ORDER — DIPHENHYDRAMINE HCL 25 MG PO CAPS
ORAL_CAPSULE | ORAL | Status: AC
  Filled 2015-09-20: qty 1

## 2015-09-20 MED ORDER — ACETAMINOPHEN 325 MG PO TABS
ORAL_TABLET | ORAL | Status: AC
  Filled 2015-09-20: qty 2

## 2015-09-20 MED ORDER — HEPARIN SOD (PORK) LOCK FLUSH 100 UNIT/ML IV SOLN
500.0000 [IU] | Freq: Every day | INTRAVENOUS | Status: AC | PRN
  Administered 2015-09-20: 500 [IU]
  Filled 2015-09-20: qty 5

## 2015-09-20 MED ORDER — SODIUM CHLORIDE 0.9 % IV SOLN
250.0000 mL | Freq: Once | INTRAVENOUS | Status: AC
  Administered 2015-09-20: 250 mL via INTRAVENOUS

## 2015-09-20 MED ORDER — SODIUM CHLORIDE 0.9% FLUSH
10.0000 mL | INTRAVENOUS | Status: AC | PRN
  Administered 2015-09-20: 10 mL
  Filled 2015-09-20: qty 10

## 2015-09-20 NOTE — Patient Instructions (Signed)

## 2015-09-20 NOTE — Telephone Encounter (Signed)
INR 1.8   Current coumadin dose is 2.5 mg daily except 5 mg on Wednesdays  Next lab scheduled 6/27   Of note pt is having some blood in stools and abd cramping with drop in heme level.  Per MD no change in coumadin dose.  Pt to receive 1 unit of blood today.

## 2015-09-21 ENCOUNTER — Other Ambulatory Visit: Payer: Self-pay | Admitting: *Deleted

## 2015-09-21 ENCOUNTER — Telehealth: Payer: Self-pay | Admitting: Oncology

## 2015-09-21 ENCOUNTER — Other Ambulatory Visit (HOSPITAL_BASED_OUTPATIENT_CLINIC_OR_DEPARTMENT_OTHER): Payer: Medicare Other

## 2015-09-21 ENCOUNTER — Ambulatory Visit (HOSPITAL_BASED_OUTPATIENT_CLINIC_OR_DEPARTMENT_OTHER): Payer: Medicare Other

## 2015-09-21 ENCOUNTER — Telehealth: Payer: Self-pay | Admitting: *Deleted

## 2015-09-21 VITALS — BP 173/72 | HR 64 | Temp 97.9°F | Resp 18

## 2015-09-21 DIAGNOSIS — R5381 Other malaise: Secondary | ICD-10-CM

## 2015-09-21 DIAGNOSIS — D649 Anemia, unspecified: Secondary | ICD-10-CM

## 2015-09-21 DIAGNOSIS — K922 Gastrointestinal hemorrhage, unspecified: Secondary | ICD-10-CM

## 2015-09-21 DIAGNOSIS — K921 Melena: Secondary | ICD-10-CM

## 2015-09-21 DIAGNOSIS — D500 Iron deficiency anemia secondary to blood loss (chronic): Secondary | ICD-10-CM

## 2015-09-21 DIAGNOSIS — R5383 Other fatigue: Secondary | ICD-10-CM

## 2015-09-21 LAB — CBC WITH DIFFERENTIAL/PLATELET
BASO%: 0.5 % (ref 0.0–2.0)
Basophils Absolute: 0 10*3/uL (ref 0.0–0.1)
EOS ABS: 0.1 10*3/uL (ref 0.0–0.5)
EOS%: 1.1 % (ref 0.0–7.0)
HEMATOCRIT: 28.9 % — AB (ref 34.8–46.6)
HEMOGLOBIN: 9.5 g/dL — AB (ref 11.6–15.9)
LYMPH#: 0.8 10*3/uL — AB (ref 0.9–3.3)
LYMPH%: 14 % (ref 14.0–49.7)
MCH: 32.2 pg (ref 25.1–34.0)
MCHC: 33 g/dL (ref 31.5–36.0)
MCV: 97.5 fL (ref 79.5–101.0)
MONO#: 0.4 10*3/uL (ref 0.1–0.9)
MONO%: 7.3 % (ref 0.0–14.0)
NEUT%: 77.1 % — ABNORMAL HIGH (ref 38.4–76.8)
NEUTROS ABS: 4.2 10*3/uL (ref 1.5–6.5)
PLATELETS: 136 10*3/uL — AB (ref 145–400)
RBC: 2.96 10*6/uL — AB (ref 3.70–5.45)
RDW: 18.2 % — AB (ref 11.2–14.5)
WBC: 5.4 10*3/uL (ref 3.9–10.3)

## 2015-09-21 MED ORDER — ACETAMINOPHEN 325 MG PO TABS
650.0000 mg | ORAL_TABLET | Freq: Once | ORAL | Status: AC
  Administered 2015-09-21: 650 mg via ORAL

## 2015-09-21 MED ORDER — SODIUM CHLORIDE 0.9 % IV SOLN
Freq: Once | INTRAVENOUS | Status: AC
  Administered 2015-09-21: 15:00:00 via INTRAVENOUS

## 2015-09-21 MED ORDER — HEPARIN SOD (PORK) LOCK FLUSH 100 UNIT/ML IV SOLN
500.0000 [IU] | Freq: Once | INTRAVENOUS | Status: AC
  Administered 2015-09-21: 500 [IU] via INTRAVENOUS
  Filled 2015-09-21: qty 5

## 2015-09-21 MED ORDER — ACETAMINOPHEN 325 MG PO TABS
ORAL_TABLET | ORAL | Status: AC
  Filled 2015-09-21: qty 2

## 2015-09-21 MED ORDER — DIPHENHYDRAMINE HCL 25 MG PO CAPS
ORAL_CAPSULE | ORAL | Status: AC
  Filled 2015-09-21: qty 1

## 2015-09-21 MED ORDER — SODIUM CHLORIDE 0.9% FLUSH
10.0000 mL | INTRAVENOUS | Status: DC | PRN
  Administered 2015-09-21: 10 mL via INTRAVENOUS
  Filled 2015-09-21: qty 10

## 2015-09-21 MED ORDER — DIPHENHYDRAMINE HCL 25 MG PO CAPS
25.0000 mg | ORAL_CAPSULE | Freq: Once | ORAL | Status: AC
  Administered 2015-09-21: 25 mg via ORAL

## 2015-09-21 MED ORDER — SODIUM CHLORIDE 0.9 % IV SOLN
510.0000 mg | Freq: Once | INTRAVENOUS | Status: AC
  Administered 2015-09-21: 510 mg via INTRAVENOUS
  Filled 2015-09-21: qty 17

## 2015-09-21 NOTE — Telephone Encounter (Signed)
Per staff message and POF I have scheduled appts. Advised scheduler of appts. JMW  

## 2015-09-21 NOTE — Telephone Encounter (Signed)
Due to low iron of 45 in pt with known GI bleed- MD requesting iron infusion ASAP- urgent POF placed and pt is aware of recommendation.

## 2015-09-21 NOTE — Telephone Encounter (Signed)
spoke w/ pt confirmed 6/23 & 6/30 apts

## 2015-09-21 NOTE — Patient Instructions (Addendum)
 Ferumoxytol injection What is this medicine? FERUMOXYTOL is an iron complex. Iron is used to make healthy red blood cells, which carry oxygen and nutrients throughout the body. This medicine is used to treat iron deficiency anemia in people with chronic kidney disease. This medicine may be used for other purposes; ask your health care provider or pharmacist if you have questions. What should I tell my health care provider before I take this medicine? They need to know if you have any of these conditions: -anemia not caused by low iron levels -high levels of iron in the blood -magnetic resonance imaging (MRI) test scheduled -an unusual or allergic reaction to iron, other medicines, foods, dyes, or preservatives -pregnant or trying to get pregnant -breast-feeding How should I use this medicine? This medicine is for injection into a vein. It is given by a health care professional in a hospital or clinic setting. Talk to your pediatrician regarding the use of this medicine in children. Special care may be needed. Overdosage: If you think you have taken too much of this medicine contact a poison control center or emergency room at once. NOTE: This medicine is only for you. Do not share this medicine with others. What if I miss a dose? It is important not to miss your dose. Call your doctor or health care professional if you are unable to keep an appointment. What may interact with this medicine? This medicine may interact with the following medications: -other iron products This list may not describe all possible interactions. Give your health care provider a list of all the medicines, herbs, non-prescription drugs, or dietary supplements you use. Also tell them if you smoke, drink alcohol, or use illegal drugs. Some items may interact with your medicine. What should I watch for while using this medicine? Visit your doctor or healthcare professional regularly. Tell your doctor or healthcare  professional if your symptoms do not start to get better or if they get worse. You may need blood work done while you are taking this medicine. You may need to follow a special diet. Talk to your doctor. Foods that contain iron include: whole grains/cereals, dried fruits, beans, or peas, leafy green vegetables, and organ meats (liver, kidney). What side effects may I notice from receiving this medicine? Side effects that you should report to your doctor or health care professional as soon as possible: -allergic reactions like skin rash, itching or hives, swelling of the face, lips, or tongue -breathing problems -changes in blood pressure -feeling faint or lightheaded, falls -fever or chills -flushing, sweating, or hot feelings -swelling of the ankles or feet Side effects that usually do not require medical attention (Report these to your doctor or health care professional if they continue or are bothersome.): -diarrhea -headache -nausea, vomiting -stomach pain This list may not describe all possible side effects. Call your doctor for medical advice about side effects. You may report side effects to FDA at 1-800-FDA-1088. Where should I keep my medicine? This drug is given in a hospital or clinic and will not be stored at home. NOTE: This sheet is a summary. It may not cover all possible information. If you have questions about this medicine, talk to your doctor, pharmacist, or health care provider.    2016, Elsevier/Gold Standard. (2011-11-01 15:23:36) Blood Transfusion  A blood transfusion is a procedure in which you receive donated blood through an IV tube. You may need a blood transfusion because of illness, surgery, or injury. The blood may come from   a donor, or it may be your own blood that you donated previously. The blood given in a transfusion is made up of different types of cells. You may receive:  Red blood cells. These carry oxygen and replace lost blood.  Platelets. These  control bleeding.  Plasma. Thishelps blood to clot. If you have hemophilia or another clotting disorder, you may also receive other types of blood products. LET YOUR HEALTH CARE PROVIDER KNOW ABOUT:  Any allergies you have.  All medicines you are taking, including vitamins, herbs, eye drops, creams, and over-the-counter medicines.  Previous problems you or members of your family have had with the use of anesthetics.  Any blood disorders you have.  Previous surgeries you have had.  Any medical conditions you may have.  Any previous reactions you have had during a blood transfusion.  RISKS AND COMPLICATIONS Generally, this is a safe procedure. However, problems may occur, including:  Having an allergic reaction to something in the donated blood.  Fever. This may be a reaction to the white blood cells in the transfused blood.  Iron overload. This can happen from having many transfusions.  Transfusion-related acute lung injury (TRALI). This is a rare reaction that causes lung damage. The cause is not known.TRALI can occur within hours of a transfusion or several days later.  Sudden (acute) or delayed hemolytic reactions. This happens if your blood does not match the cells in your transfusion. Your body's defense system (immune system) may try to attack the new cells. This complication is rare.  Infection. This is rare. BEFORE THE PROCEDURE  You may have a blood test to determine your blood type. This is necessary to know what kind of blood your body will accept.  If you are going to have a planned surgery, you may donate your own blood. This may be done in case you need to have a transfusion.  If you have had an allergic reaction to a transfusion in the past, you may be given medicine to help prevent a reaction. Take this medicine only as directed by your health care provider.  You will have your temperature, blood pressure, and pulse monitored before the  transfusion. PROCEDURE   An IV will be started in your hand or arm.  The bag of donated blood will be attached to your IV tube and given into your vein.  Your temperature, blood pressure, and pulse will be monitored regularly during the transfusion. This monitoring is done to detect early signs of a transfusion reaction.  If you have any signs or symptoms of a reaction, your transfusion will be stopped and you may be given medicine.  When the transfusion is over, your IV will be removed.  Pressure may be applied to the IV site for a few minutes.  A bandage (dressing) will be applied. The procedure may vary among health care providers and hospitals. AFTER THE PROCEDURE  Your blood pressure, temperature, and pulse will be monitored regularly.   This information is not intended to replace advice given to you by your health care provider. Make sure you discuss any questions you have with your health care provider.   Document Released: 03/15/2000 Document Revised: 04/08/2014 Document Reviewed: 01/26/2014 Elsevier Interactive Patient Education 2016 Elsevier Inc.  

## 2015-09-22 ENCOUNTER — Telehealth: Payer: Self-pay | Admitting: *Deleted

## 2015-09-22 ENCOUNTER — Ambulatory Visit: Payer: Medicare Other

## 2015-09-22 LAB — TYPE AND SCREEN
ABO/RH(D): O POS
Antibody Screen: NEGATIVE
UNIT DIVISION: 0
UNIT DIVISION: 0

## 2015-09-22 NOTE — Telephone Encounter (Signed)
Per staff message and POF I have scheduled appts. Advised scheduler of appts. JMW  

## 2015-09-25 ENCOUNTER — Telehealth: Payer: Self-pay | Admitting: Cardiovascular Disease

## 2015-09-25 NOTE — Telephone Encounter (Signed)
Have her hold carvedilol today.  Take tomorrow am dose if HR > 60.  Keep appt with [facility] for tomorrow.

## 2015-09-25 NOTE — Telephone Encounter (Signed)
New message    Pt c/o BP issue: STAT if pt c/o blurred vision, one-sided weakness or slurred speech  1. What are your last 5 BP readings? bp this am per pt 121/61 and rechecked by nurse at the [facility] 128/56  2. Are you having any other symptoms (ex. Dizziness, headache, blurred vision, passed out)? Per clinical nurse no  3. What is your BP issue? Per clinical nurse no- the Ap per clinical nurse was 45 according to the nurse was checked for 1 whole minute, per the pt euipement at home the AP was 66   The Clinical nurse at [facility] the pt needs to go and review the medications with a nurse here. The clinical nurses phone number is [phone]

## 2015-09-25 NOTE — Telephone Encounter (Signed)
Spoke with pt, her pulse this morning was 45. Pt reports she does not feel well, occ lightheadedness, weakness and SOB. She has not taken any of her medications this morning. She has a follow up appointment tomorrow with the hypertension clinic. She would like direction regarding what medications to take this am based on pulse rate. Will forward to pharm md to advise

## 2015-09-25 NOTE — Telephone Encounter (Signed)
Spoke with pt, aware of recommendations. 

## 2015-09-26 ENCOUNTER — Encounter: Payer: Self-pay | Admitting: Pharmacist

## 2015-09-26 ENCOUNTER — Ambulatory Visit (INDEPENDENT_AMBULATORY_CARE_PROVIDER_SITE_OTHER): Payer: Medicare Other | Admitting: Pharmacist

## 2015-09-26 VITALS — BP 122/78 | HR 44 | Wt 122.4 lb

## 2015-09-26 DIAGNOSIS — I10 Essential (primary) hypertension: Secondary | ICD-10-CM

## 2015-09-26 DIAGNOSIS — I6330 Cerebral infarction due to thrombosis of unspecified cerebral artery: Secondary | ICD-10-CM | POA: Diagnosis not present

## 2015-09-26 MED ORDER — HYDROCHLOROTHIAZIDE 25 MG PO TABS: 12.5000 mg | ORAL_TABLET | Freq: Every evening | ORAL | Status: DC

## 2015-09-26 MED ORDER — HYDROCHLOROTHIAZIDE 12.5 MG PO TABS: 12.5000 mg | ORAL_TABLET | Freq: Every evening | ORAL | Status: DC

## 2015-09-26 MED ORDER — CARVEDILOL 6.25 MG PO TABS: 6.2500 mg | ORAL_TABLET | Freq: Two times a day (BID) | ORAL | Status: DC

## 2015-09-26 NOTE — Progress Notes (Signed)
Patient ID: Deborah [name]                 DOB: October 10, 1926                      MRN: [id number]     HPI: Deborah [name] is a 82 y.o. female patient of Dr. Oval Linsey, with PMH below who presents today for hypertension follow-up. She has been feeling very poorly over the last few weeks. She is tired and short of breath recently. She also states she has had several episodes of "swiminess" and these occur while she is sitting.  Of note she had an endoscopy about 2 months ago at which time 7 different places were cauterized due to bleeding. She had a transfusion last week for Hgb drop 11->7 in a weeks time. She also received ferritin and will have another dose next week. She admits this could be the cause of her sluggishness or could at least be contributing.   She called yesterday about low HR and BP. Her BP was 129/78 and HR 46 and she skipped her carvedilol dose last night. This morning she reports her BP was 138/55 and HR 63 prior to medications. She is also concerned that this is contributing to her feeling poorly.  Cardiac HX: HTN, HLD, chronic AF, stroke, mechanical AVR, hypothyroidism  Current HTN meds:  Amlodipine '5mg'$  in the evening Carvedilol 12.'5mg'$  BID Hydrochlorothiazide 12.'5mg'$  in the evening Spironolactone '25mg'$  BID Valsartan '320mg'$  in the morning  Previously tried:  Amlodipine '10mg'$  - causes swelling  BP goal: <150/90   Family History: father passed from MI at 29  Social History: no tobacco (quit in 1970's), drinks 1 mixed drink each night, occasional coffee  Diet: still cooks most of her meals despite living at [employer], eats in the dining room 1-2 times per week; only uses light salt on some foods  Exercise: does stretching exercises daily, walks daily and stays active throughout the day  Home BP readings: has home cuff but has BP and HR checked by nurse at nursing home [facility]. She also has a pulseox in her home which gave an accurate pulse today in office.  Wt  Readings from Last 3 Encounters:  09/26/15 122 lb 6.4 oz (55.52 kg)  09/07/15 121 lb 6.4 oz (55.067 kg)  08/21/15 119 lb 12.8 oz (54.341 kg)   BP Readings from Last 3 Encounters:  09/26/15 122/78  09/21/15 173/72  09/20/15 107/49   Pulse Readings from Last 3 Encounters:  09/26/15 44  09/21/15 64  09/20/15 72    Renal function: Estimated Creatinine Clearance: 26.3 mL/min (by C-G formula based on Cr of 1.2).  Past Medical History  Diagnosis Date  . Hypothyroidism   . Stomach problems     seeing Dr Watt Climes  . Hypertension   . GERD (gastroesophageal reflux disease)   . Anemia   . Arrhythmia     Atrial Fibrillation  . Breast cancer ([place])     "right; S/P lumpectomy, chemo, XRT"   . High cholesterol   . Heart murmur   . History of blood transfusion 1996; ~ 2013    "related to valve replacement; GI bleeding"   . Chronic lower GI bleeding   . Arthritis     "fingers, toes, hips; qwhere" (05/19/2013)  . Gout   . Pneumonia 07/2011  . Rectus sheath hematoma 12/19/14     held anticoagulation for 1 week, transfused  . TIA (transient ischemic attack) 12/24/14  symptoms resolved  . History of nuclear stress test     Myoview 1/17: EF 59%, normal perfusion, low risk  . History of echocardiogram     Echo 1/17: mild LVH, EF 50-55%, no RWMA, mild AI, mechanical AVR with mean 21 mmHg/peak 42 mmHg (stable since 2012), mod MR, mod to severe BAE, PASP 36 mmHg  . Complication of anesthesia     "once I'm awakened, I don't sleep til the following night"    Current Outpatient Prescriptions on File Prior to Visit  Medication Sig Dispense Refill  . allopurinol (ZYLOPRIM) 300 MG tablet Take 300 mg by mouth daily.    . celecoxib (CELEBREX) 200 MG capsule Take 200 mg by mouth every other day.   4  . dicyclomine (BENTYL) 10 MG capsule Take 10 mg by mouth daily as needed for spasms.     . digoxin (LANOXIN) 0.125 MG tablet Take 0.125 mg by mouth daily.    . fexofenadine (ALLEGRA ALLERGY) 180 MG  tablet Take 180 mg by mouth daily as needed for allergies.     Marland Kitchen levothyroxine (SYNTHROID, LEVOTHROID) 88 MCG tablet Take 88 mcg by mouth daily before breakfast.    . simvastatin (ZOCOR) 20 MG tablet TAKE ONE-HALF TABLET DAILY 30 tablet 3  . spironolactone (ALDACTONE) 25 MG tablet Take 25 mg by mouth 2 (two) times daily.  5  . valsartan (DIOVAN) 320 MG tablet Take 1 tablet (320 mg total) by mouth daily. 30 tablet 5  . warfarin (COUMADIN) 5 MG tablet TAKE ONE TABLET BY MOUTH ONCE DAILY 30 tablet 3  . zolpidem (AMBIEN CR) 12.5 MG CR tablet Take 12.5 mg by mouth at bedtime as needed for sleep.    Marland Kitchen acetaminophen (TYLENOL) 500 MG tablet Take 1,000 mg by mouth every 6 (six) hours as needed for mild pain, moderate pain or headache. Reported on 09/26/2015    . calcium-vitamin D 500 MG tablet Take 1 tablet by mouth 2 (two) times daily. Reported on 09/26/2015    . multivitamin [facility]) per tablet Take 1 tablet by mouth daily. Reported on 09/26/2015    . NITROSTAT 0.4 MG SL tablet Place 0.4 mg under the tongue every 5 (five) minutes as needed for chest pain (x 3 doses). Reported on 09/26/2015  1   No current facility-administered medications on file prior to visit.    Allergies  Allergen Reactions  . Amiodarone Shortness Of Breath  . Alendronate Sodium Other (See Comments)    GI upset  . Macrodantin [Nitrofurantoin] Other (See Comments)    GI upset  . Meclizine Swelling    tongue  . Norvasc [Amlodipine Besylate] Swelling    edema  . Tussionex Pennkinetic Er [Hydrocod Polst-Cpm Polst Er] Other (See Comments)    Reaction unknown     Assessment/Plan: Hypertension: BP is well below goal today with low HR. Will have her discontinue amlodipine and decrease carvedilol to 6.'25mg'$  BID. Would like to keep her on a low dose betablocker for afib if possible. Provided her a complete list of her blood pressure medications and the dose/how they should be taken as she seems to slightly confused since at one time  she was on multiple medications whose strength was '25mg'$  BID (carvedilol, spironolactone, hydrochlorothiazide) and many of these have been changed relatively recently. Instructed her to call if her HR remains in the 40s. Follow up in hypertension clinic in 3-4 weeks.    Thank you, Lelan Pons. Patterson Hamm[place], [place]

## 2015-09-26 NOTE — Patient Instructions (Addendum)
Return for a a follow up appointment in 3-4 weeks.  Your blood pressure today is 122/78 pulse is 44  Check your blood pressure at home daily (if able) and keep record of the readings.  Take your BP meds as follows: Stop amlodipine (Norvasc)  Hydrochlorothiazide 12.5 mg once daily Valsartan '320mg'$  once daily Spironolactone '25mg'$  twice daily Decrease Carvedilol 6.'25mg'$  twice daily  Bring all of your meds, your BP cuff and your record of home blood pressures to your next appointment.  Exercise as you're able, try to walk approximately 30 minutes per day.  Keep salt intake to a minimum, especially watch canned and prepared boxed foods.  Eat more fresh fruits and vegetables and fewer canned items.  Avoid eating in fast food restaurants.    HOW TO TAKE YOUR BLOOD PRESSURE: . Rest 5 minutes before taking your blood pressure. .  Don't smoke or drink caffeinated beverages for at least 30 minutes before. . Take your blood pressure before (not after) you eat. . Sit comfortably with your back supported and both feet on the floor (don't cross your legs). . Elevate your arm to heart level on a table or a desk. . Use the proper sized cuff. It should fit smoothly and snugly around your bare upper arm. There should be enough room to slip a fingertip under the cuff. The bottom edge of the cuff should be 1 inch above the crease of the elbow. . Ideally, take 3 measurements at one sitting and record the average.

## 2015-09-28 ENCOUNTER — Ambulatory Visit (HOSPITAL_BASED_OUTPATIENT_CLINIC_OR_DEPARTMENT_OTHER): Payer: Medicare Other

## 2015-09-28 ENCOUNTER — Other Ambulatory Visit: Payer: Self-pay | Admitting: *Deleted

## 2015-09-28 ENCOUNTER — Other Ambulatory Visit (HOSPITAL_BASED_OUTPATIENT_CLINIC_OR_DEPARTMENT_OTHER): Payer: Medicare Other

## 2015-09-28 VITALS — BP 159/71 | HR 62 | Temp 97.6°F | Resp 18

## 2015-09-28 DIAGNOSIS — K922 Gastrointestinal hemorrhage, unspecified: Secondary | ICD-10-CM

## 2015-09-28 DIAGNOSIS — I481 Persistent atrial fibrillation: Secondary | ICD-10-CM | POA: Diagnosis not present

## 2015-09-28 DIAGNOSIS — D649 Anemia, unspecified: Secondary | ICD-10-CM

## 2015-09-28 DIAGNOSIS — D500 Iron deficiency anemia secondary to blood loss (chronic): Secondary | ICD-10-CM | POA: Diagnosis present

## 2015-09-28 DIAGNOSIS — D689 Coagulation defect, unspecified: Secondary | ICD-10-CM

## 2015-09-28 DIAGNOSIS — D62 Acute posthemorrhagic anemia: Secondary | ICD-10-CM

## 2015-09-28 DIAGNOSIS — K921 Melena: Secondary | ICD-10-CM

## 2015-09-28 DIAGNOSIS — I4819 Other persistent atrial fibrillation: Secondary | ICD-10-CM

## 2015-09-28 LAB — PREPARE RBC (CROSSMATCH)

## 2015-09-28 LAB — CBC WITH DIFFERENTIAL/PLATELET
BASO%: 0.4 % (ref 0.0–2.0)
BASOS ABS: 0 10*3/uL (ref 0.0–0.1)
EOS%: 1.5 % (ref 0.0–7.0)
Eosinophils Absolute: 0.1 10*3/uL (ref 0.0–0.5)
HEMATOCRIT: 28.2 % — AB (ref 34.8–46.6)
HEMOGLOBIN: 9.5 g/dL — AB (ref 11.6–15.9)
LYMPH#: 0.8 10*3/uL — AB (ref 0.9–3.3)
LYMPH%: 13.1 % — ABNORMAL LOW (ref 14.0–49.7)
MCH: 33.1 pg (ref 25.1–34.0)
MCHC: 33.8 g/dL (ref 31.5–36.0)
MCV: 98.1 fL (ref 79.5–101.0)
MONO#: 0.4 10*3/uL (ref 0.1–0.9)
MONO%: 6 % (ref 0.0–14.0)
NEUT#: 5 10*3/uL (ref 1.5–6.5)
NEUT%: 79 % — ABNORMAL HIGH (ref 38.4–76.8)
PLATELETS: 183 10*3/uL (ref 145–400)
RBC: 2.87 10*6/uL — ABNORMAL LOW (ref 3.70–5.45)
RDW: 21.5 % — AB (ref 11.2–14.5)
WBC: 6.3 10*3/uL (ref 3.9–10.3)

## 2015-09-28 LAB — PROTIME-INR
INR: 2.6 (ref 2.00–3.50)
Protime: 31.2 Seconds — ABNORMAL HIGH (ref 10.6–13.4)

## 2015-09-28 MED ORDER — DIPHENHYDRAMINE HCL 25 MG PO CAPS
ORAL_CAPSULE | ORAL | Status: AC
  Filled 2015-09-28: qty 2

## 2015-09-28 MED ORDER — HEPARIN SOD (PORK) LOCK FLUSH 100 UNIT/ML IV SOLN
250.0000 [IU] | INTRAVENOUS | Status: DC | PRN
  Filled 2015-09-28: qty 5

## 2015-09-28 MED ORDER — DIPHENHYDRAMINE HCL 25 MG PO CAPS
25.0000 mg | ORAL_CAPSULE | Freq: Once | ORAL | Status: AC
  Administered 2015-09-28: 25 mg via ORAL

## 2015-09-28 MED ORDER — SODIUM CHLORIDE 0.9 % IV SOLN
250.0000 mL | Freq: Once | INTRAVENOUS | Status: AC
  Administered 2015-09-28: 250 mL via INTRAVENOUS

## 2015-09-28 MED ORDER — SODIUM CHLORIDE 0.9% FLUSH
3.0000 mL | INTRAVENOUS | Status: DC | PRN
  Filled 2015-09-28: qty 10

## 2015-09-28 MED ORDER — ACETAMINOPHEN 325 MG PO TABS
ORAL_TABLET | ORAL | Status: AC
  Filled 2015-09-28: qty 2

## 2015-09-28 MED ORDER — HEPARIN SOD (PORK) LOCK FLUSH 100 UNIT/ML IV SOLN
500.0000 [IU] | Freq: Once | INTRAVENOUS | Status: AC
  Administered 2015-09-28: 500 [IU] via INTRAVENOUS
  Filled 2015-09-28: qty 5

## 2015-09-28 MED ORDER — SODIUM CHLORIDE 0.9 % IV SOLN
510.0000 mg | Freq: Once | INTRAVENOUS | Status: DC
  Filled 2015-09-28: qty 17

## 2015-09-28 MED ORDER — FERUMOXYTOL INJECTION 510 MG/17 ML
510.0000 mg | Freq: Once | INTRAVENOUS | Status: AC
  Administered 2015-09-28: 510 mg via INTRAVENOUS
  Filled 2015-09-28: qty 17

## 2015-09-28 MED ORDER — SODIUM CHLORIDE 0.9% FLUSH
10.0000 mL | INTRAVENOUS | Status: DC | PRN
  Administered 2015-09-28: 10 mL via INTRAVENOUS
  Filled 2015-09-28: qty 10

## 2015-09-28 MED ORDER — ACETAMINOPHEN 325 MG PO TABS
650.0000 mg | ORAL_TABLET | Freq: Once | ORAL | Status: AC
  Administered 2015-09-28: 650 mg via ORAL

## 2015-09-29 ENCOUNTER — Ambulatory Visit: Payer: Medicare Other

## 2015-09-29 ENCOUNTER — Ambulatory Visit (HOSPITAL_BASED_OUTPATIENT_CLINIC_OR_DEPARTMENT_OTHER): Payer: Medicare Other

## 2015-09-29 VITALS — BP 154/77 | HR 74 | Temp 97.7°F | Resp 17

## 2015-09-29 DIAGNOSIS — D500 Iron deficiency anemia secondary to blood loss (chronic): Secondary | ICD-10-CM

## 2015-09-29 DIAGNOSIS — K922 Gastrointestinal hemorrhage, unspecified: Secondary | ICD-10-CM | POA: Diagnosis not present

## 2015-09-29 DIAGNOSIS — D649 Anemia, unspecified: Secondary | ICD-10-CM | POA: Diagnosis not present

## 2015-09-29 LAB — PREPARE RBC (CROSSMATCH)

## 2015-09-29 MED ORDER — DIPHENHYDRAMINE HCL 25 MG PO CAPS
25.0000 mg | ORAL_CAPSULE | Freq: Once | ORAL | Status: AC
  Administered 2015-09-29: 25 mg via ORAL

## 2015-09-29 MED ORDER — ACETAMINOPHEN 325 MG PO TABS
ORAL_TABLET | ORAL | Status: AC
  Filled 2015-09-29: qty 2

## 2015-09-29 MED ORDER — HEPARIN SOD (PORK) LOCK FLUSH 100 UNIT/ML IV SOLN
500.0000 [IU] | Freq: Every day | INTRAVENOUS | Status: AC | PRN
  Administered 2015-09-29: 500 [IU]
  Filled 2015-09-29: qty 5

## 2015-09-29 MED ORDER — ACETAMINOPHEN 325 MG PO TABS
650.0000 mg | ORAL_TABLET | Freq: Once | ORAL | Status: AC
  Administered 2015-09-29: 650 mg via ORAL

## 2015-09-29 MED ORDER — SODIUM CHLORIDE 0.9% FLUSH
10.0000 mL | INTRAVENOUS | Status: AC | PRN
  Administered 2015-09-29: 10 mL
  Filled 2015-09-29: qty 10

## 2015-09-29 MED ORDER — DIPHENHYDRAMINE HCL 25 MG PO CAPS
ORAL_CAPSULE | ORAL | Status: AC
  Filled 2015-09-29: qty 1

## 2015-09-29 MED ORDER — SODIUM CHLORIDE 0.9 % IV SOLN
250.0000 mL | Freq: Once | INTRAVENOUS | Status: AC
  Administered 2015-09-29: 250 mL via INTRAVENOUS

## 2015-09-29 NOTE — Patient Instructions (Signed)

## 2015-09-30 LAB — TYPE AND SCREEN
ABO/RH(D): O POS
ANTIBODY SCREEN: NEGATIVE
Unit division: 0
Unit division: 0

## 2015-10-05 ENCOUNTER — Other Ambulatory Visit: Payer: Self-pay | Admitting: *Deleted

## 2015-10-05 ENCOUNTER — Other Ambulatory Visit (HOSPITAL_BASED_OUTPATIENT_CLINIC_OR_DEPARTMENT_OTHER): Payer: Medicare Other

## 2015-10-05 ENCOUNTER — Telehealth: Payer: Self-pay | Admitting: *Deleted

## 2015-10-05 DIAGNOSIS — D62 Acute posthemorrhagic anemia: Secondary | ICD-10-CM

## 2015-10-05 DIAGNOSIS — D500 Iron deficiency anemia secondary to blood loss (chronic): Secondary | ICD-10-CM

## 2015-10-05 DIAGNOSIS — D689 Coagulation defect, unspecified: Secondary | ICD-10-CM

## 2015-10-05 DIAGNOSIS — I481 Persistent atrial fibrillation: Secondary | ICD-10-CM | POA: Diagnosis not present

## 2015-10-05 DIAGNOSIS — I4819 Other persistent atrial fibrillation: Secondary | ICD-10-CM

## 2015-10-05 DIAGNOSIS — K921 Melena: Secondary | ICD-10-CM

## 2015-10-05 LAB — CBC WITH DIFFERENTIAL/PLATELET
BASO%: 0.2 % (ref 0.0–2.0)
BASOS ABS: 0 10*3/uL (ref 0.0–0.1)
EOS ABS: 0.1 10*3/uL (ref 0.0–0.5)
EOS%: 2 % (ref 0.0–7.0)
HCT: 32.3 % — ABNORMAL LOW (ref 34.8–46.6)
HEMOGLOBIN: 10.5 g/dL — AB (ref 11.6–15.9)
LYMPH#: 0.7 10*3/uL — AB (ref 0.9–3.3)
LYMPH%: 13.3 % — ABNORMAL LOW (ref 14.0–49.7)
MCH: 32.6 pg (ref 25.1–34.0)
MCHC: 32.5 g/dL (ref 31.5–36.0)
MCV: 100.3 fL (ref 79.5–101.0)
MONO#: 0.4 10*3/uL (ref 0.1–0.9)
MONO%: 7.5 % (ref 0.0–14.0)
NEUT%: 77 % — ABNORMAL HIGH (ref 38.4–76.8)
NEUTROS ABS: 3.8 10*3/uL (ref 1.5–6.5)
NRBC: 0 % (ref 0–0)
PLATELETS: 149 10*3/uL (ref 145–400)
RBC: 3.22 10*6/uL — ABNORMAL LOW (ref 3.70–5.45)
RDW: 20.8 % — AB (ref 11.2–14.5)
WBC: 5 10*3/uL (ref 3.9–10.3)

## 2015-10-05 LAB — PROTIME-INR
INR: 1.5 — ABNORMAL LOW (ref 2.00–3.50)
Protime: 18 Seconds — ABNORMAL HIGH (ref 10.6–13.4)

## 2015-10-05 NOTE — Telephone Encounter (Signed)
INR 1.5  Heme 10.5  Current coumadin dose is 2.5 mg daily.  Per inquiry - pt states " I stopped having dark stools about 3 days ago ".  Per MD pt should continue at current dose- pt stated concern for low level and other health concerns.  Recommendation per MD is for pt to take 2.5 mg daily and then on 7/10 take 5 mg coumadin. Recheck lab on 7/13.  Above discussed with pt.

## 2015-10-06 ENCOUNTER — Telehealth: Payer: Self-pay | Admitting: *Deleted

## 2015-10-06 ENCOUNTER — Telehealth: Payer: Self-pay | Admitting: Cardiovascular Disease

## 2015-10-06 NOTE — Telephone Encounter (Signed)
New message     Talk to someone about her coumadin dosage

## 2015-10-06 NOTE — Telephone Encounter (Signed)
Message received from [place] with Dr Oval Linsey - per pt contacting them with concerns of most recent INR and Dr Jannifer Rodney recommendation to not increase dose of coumadin of 2.5 mg daily until 7/10 with 5 mg on that day then resume 2.5 mg.  This RN reviewed concerns with pt yesterday per lab- MD concern is in June - she required 4 units of blood for dark stools due to GI bleeding. Concern is if continued dark stools indicating GI bleed - coumadin may be adding to bleeding and need for blood transfusion.  Per discussion yesterday - Lynsay states dark stools has resolved " 3 days ago " indicating 7/3 as last date of " dark tarry stools ".  Noted INR of 1.5 and heme of 10.5 - but no GI bleeding- goal is to maintain no GI bleeding for at least 1 week and then increase coumadin per Dr Jana Hakim.  This RN left message per call from [place] on identified VM- MD aware of pt's concerns and possible need to communicate with Dr Oval Linsey for continuity of care and recommendations relating to dosing of coumadin in pt with comorbid diagnosis.  This note will be routed to [place] per call and to Dr Jana Hakim.

## 2015-10-06 NOTE — Telephone Encounter (Addendum)
Returned call to patient to follow-up. Instructed her of recommendations from [place] - would like her to be clear x7 days before increasing dose because of concern of bleeding.   Patient states she understands concern but she would rather bleed than have a stroke. She is extremely concerned that her INR will continue to drop over the weekend on the lower dose of warfarin. I reeducated her on signs and symptoms of a stroke and told her to call 911 immediately if she suspected she was having an issue. At time our office does not have additional recommendations for dosing since we are currently in the position of balancing bleeding with risk of clot.   She stated that her next INR check was scheduled for Thursday. She she has been clear for 4 days with regards to dark stool she could consider moving up her appt to earlier in the week (after the 7 days) so they could consider increasing dose at that time.   She stated she appreciated my time and help and hoped that Dr. Oval Linsey and Dr. Marjie Skiff could have a good conversation to determine the best INR goal for her given circumstances.

## 2015-10-06 NOTE — Telephone Encounter (Signed)
Received return call with more information on patient bleeding from [facility] (Dr. Virgie Dad nurse). Per message patient has had GI bleed requiring 4U of blood. Dr. Virgie Dad concern is that warfarin is contributing to bleeding and need for transfusions with INR >2-2.5.   Per message plan to increase dose if no GI bleed for at least 1 week.   Mateo Flow left her call back number [phone] and Dr. Fredderick Phenix cell if Dr. Oval Linsey wished to speak to her directly (see incoming calls).   Will route message to Dr. Oval Linsey for any additional recommendations and call to make patient aware that plan to increase dose once GI is clear.

## 2015-10-06 NOTE — Telephone Encounter (Signed)
Returned call to patient. She states her INR was recently checked by cancer center and was 1.5. She was told to remain on the same dose of 2.'5mg'$  daily with one day of 5 mg a week. She reports she took '5mg'$  yesterday since it was so low. She states she has previously been bridged with Lovenox for low INR but experienced a hematoma last year while on lovenox therapy so she is nervous to go back on this at this time.   She notes that she is no longer bleeding and very concerned about having a stroke. She is wishing to know Dr. Blenda Mounts recommendation for goal INR. She would like all of her providers to be on the same page with the goal INR.  She has a history of [employer] Jude Aortic Valve replacement so the usual goal would be 2.5-3.5. She does have a history of recurrent GI bleed requiring transfusions so it appears in the past she has been kept at a lower goal of 2-2.5.   I will call cancer center to get more information from them and route message to Dr. Oval Linsey for recommendations.

## 2015-10-06 NOTE — Telephone Encounter (Signed)
LMTCB with cancer center triage nurse about warfarin dosing

## 2015-10-10 ENCOUNTER — Other Ambulatory Visit (HOSPITAL_BASED_OUTPATIENT_CLINIC_OR_DEPARTMENT_OTHER): Payer: Medicare Other

## 2015-10-10 ENCOUNTER — Other Ambulatory Visit: Payer: Self-pay

## 2015-10-10 DIAGNOSIS — I4819 Other persistent atrial fibrillation: Secondary | ICD-10-CM

## 2015-10-10 DIAGNOSIS — D62 Acute posthemorrhagic anemia: Secondary | ICD-10-CM

## 2015-10-10 DIAGNOSIS — D689 Coagulation defect, unspecified: Secondary | ICD-10-CM

## 2015-10-10 DIAGNOSIS — D500 Iron deficiency anemia secondary to blood loss (chronic): Secondary | ICD-10-CM | POA: Diagnosis not present

## 2015-10-10 DIAGNOSIS — I481 Persistent atrial fibrillation: Secondary | ICD-10-CM

## 2015-10-10 DIAGNOSIS — K921 Melena: Secondary | ICD-10-CM

## 2015-10-10 LAB — CBC WITH DIFFERENTIAL/PLATELET
BASO%: 0.2 % (ref 0.0–2.0)
BASOS ABS: 0 10*3/uL (ref 0.0–0.1)
EOS%: 1.3 % (ref 0.0–7.0)
Eosinophils Absolute: 0.1 10*3/uL (ref 0.0–0.5)
HEMATOCRIT: 35.1 % (ref 34.8–46.6)
HGB: 11.7 g/dL (ref 11.6–15.9)
LYMPH%: 9.6 % — ABNORMAL LOW (ref 14.0–49.7)
MCH: 32.9 pg (ref 25.1–34.0)
MCHC: 33.3 g/dL (ref 31.5–36.0)
MCV: 98.6 fL (ref 79.5–101.0)
MONO#: 0.5 10*3/uL (ref 0.1–0.9)
MONO%: 8.3 % (ref 0.0–14.0)
NEUT#: 4.5 10*3/uL (ref 1.5–6.5)
NEUT%: 80.6 % — AB (ref 38.4–76.8)
Platelets: 180 10*3/uL (ref 145–400)
RBC: 3.56 10*6/uL — ABNORMAL LOW (ref 3.70–5.45)
RDW: 19.5 % — ABNORMAL HIGH (ref 11.2–14.5)
WBC: 5.5 10*3/uL (ref 3.9–10.3)
lymph#: 0.5 10*3/uL — ABNORMAL LOW (ref 0.9–3.3)

## 2015-10-10 LAB — PROTIME-INR
INR: 1.9 — AB (ref 2.00–3.50)
PROTIME: 22.8 s — AB (ref 10.6–13.4)

## 2015-10-10 LAB — FERRITIN: Ferritin: 323 ng/ml — ABNORMAL HIGH (ref 9–269)

## 2015-10-10 MED ORDER — LEVOTHYROXINE SODIUM 88 MCG PO TABS: 88.0000 ug | ORAL_TABLET | Freq: Every day | ORAL | Status: DC

## 2015-10-11 ENCOUNTER — Encounter: Payer: Self-pay | Admitting: Oncology

## 2015-10-11 ENCOUNTER — Telehealth: Payer: Self-pay | Admitting: *Deleted

## 2015-10-11 NOTE — Telephone Encounter (Signed)
Call from patient asking what coumadin dose to use and to cancel an appointment.  Call transferred to ext [phone].

## 2015-10-11 NOTE — Progress Notes (Signed)
Patient left mess on my vmail in error-she wanted dr. Virgie Dad nurse Val. She said didn't know what prompt to use for phone tree. I let mgment know and sent mess to Val to call her.

## 2015-10-12 ENCOUNTER — Other Ambulatory Visit: Payer: Medicare Other

## 2015-10-16 ENCOUNTER — Telehealth: Payer: Self-pay | Admitting: Cardiovascular Disease

## 2015-10-16 NOTE — Telephone Encounter (Signed)
Spoke with patient and her heart rate has been in the upper 50's like always.  She stated her blood pressure was elevated so she had just taken a Clonidine She had a Clonidine last night and this am prior to her blood pressure check at [facility] Patient does have an appointment to see Pharm D in blood pressure clinic tomorrow am Advised if no improvement with Clonidine to call back and on call MD could call her Verbalized understanding

## 2015-10-16 NOTE — Telephone Encounter (Signed)
Spoke with pt, her usual bp is 130-140's, for the last several days it has been elevated and the on call nurse at [facility] instructed the pt to take clonidine. The dizziness the pt has is when she turns her head, advised to contact PCP for what sounds like vertigo symptoms. The pt wants to know what to do if her bp spikes again in the evening. Will forward to dr Oval Linsey to advise

## 2015-10-16 NOTE — Telephone Encounter (Signed)
Returned call to [place] at [facility] no answer.[place].

## 2015-10-16 NOTE — Telephone Encounter (Signed)
Follow up ° ° ° ° °Returned a call to the nurse °

## 2015-10-16 NOTE — Telephone Encounter (Signed)
If her heart rate is >60 she can take an extra dose of carvedilol 12.'5mg'$ .

## 2015-10-16 NOTE — Telephone Encounter (Signed)
New message       Pt c/o BP issue: STAT if pt c/o blurred vision, one-sided weakness or slurred speech  1. What are your last 5 BP readings? 200/98 yesterday, this am 160/72 after bp medication, and this am 156/92  2. Are you having any other symptoms (ex. Dizziness, headache, blurred vision, passed out)?  Dizziness and not feeling good  3. What is your BP issue?  Pt states that her bp is high and she does not feel good. Please advise

## 2015-10-17 ENCOUNTER — Telehealth: Payer: Self-pay | Admitting: *Deleted

## 2015-10-17 ENCOUNTER — Other Ambulatory Visit (HOSPITAL_BASED_OUTPATIENT_CLINIC_OR_DEPARTMENT_OTHER): Payer: Medicare Other

## 2015-10-17 ENCOUNTER — Ambulatory Visit (INDEPENDENT_AMBULATORY_CARE_PROVIDER_SITE_OTHER): Payer: Medicare Other | Admitting: Pharmacist

## 2015-10-17 ENCOUNTER — Encounter: Payer: Self-pay | Admitting: Pharmacist

## 2015-10-17 ENCOUNTER — Telehealth: Payer: Self-pay | Admitting: Cardiovascular Disease

## 2015-10-17 VITALS — BP 147/78 | HR 48 | Wt 122.6 lb

## 2015-10-17 DIAGNOSIS — K921 Melena: Secondary | ICD-10-CM

## 2015-10-17 DIAGNOSIS — D62 Acute posthemorrhagic anemia: Secondary | ICD-10-CM

## 2015-10-17 DIAGNOSIS — D689 Coagulation defect, unspecified: Secondary | ICD-10-CM | POA: Diagnosis present

## 2015-10-17 DIAGNOSIS — I481 Persistent atrial fibrillation: Secondary | ICD-10-CM

## 2015-10-17 DIAGNOSIS — I10 Essential (primary) hypertension: Secondary | ICD-10-CM

## 2015-10-17 DIAGNOSIS — I4819 Other persistent atrial fibrillation: Secondary | ICD-10-CM

## 2015-10-17 DIAGNOSIS — D500 Iron deficiency anemia secondary to blood loss (chronic): Secondary | ICD-10-CM

## 2015-10-17 DIAGNOSIS — I6330 Cerebral infarction due to thrombosis of unspecified cerebral artery: Secondary | ICD-10-CM

## 2015-10-17 LAB — CBC WITH DIFFERENTIAL/PLATELET
BASO%: 0.3 % (ref 0.0–2.0)
BASOS ABS: 0 10*3/uL (ref 0.0–0.1)
EOS%: 0.5 % (ref 0.0–7.0)
Eosinophils Absolute: 0 10*3/uL (ref 0.0–0.5)
HEMATOCRIT: 37.9 % (ref 34.8–46.6)
HEMOGLOBIN: 12.6 g/dL (ref 11.6–15.9)
LYMPH#: 0.5 10*3/uL — AB (ref 0.9–3.3)
LYMPH%: 9.3 % — ABNORMAL LOW (ref 14.0–49.7)
MCH: 32.8 pg (ref 25.1–34.0)
MCHC: 33.3 g/dL (ref 31.5–36.0)
MCV: 98.5 fL (ref 79.5–101.0)
MONO#: 0.3 10*3/uL (ref 0.1–0.9)
MONO%: 6.2 % (ref 0.0–14.0)
NEUT#: 4.2 10*3/uL (ref 1.5–6.5)
NEUT%: 83.7 % — ABNORMAL HIGH (ref 38.4–76.8)
Platelets: 158 10*3/uL (ref 145–400)
RBC: 3.85 10*6/uL (ref 3.70–5.45)
RDW: 20.2 % — AB (ref 11.2–14.5)
WBC: 5 10*3/uL (ref 3.9–10.3)

## 2015-10-17 LAB — PROTIME-INR
INR: 2.2 (ref 2.00–3.50)
Protime: 26.4 Seconds — ABNORMAL HIGH (ref 10.6–13.4)

## 2015-10-17 MED ORDER — HYDROCHLOROTHIAZIDE 25 MG PO TABS: 25.0000 mg | ORAL_TABLET | Freq: Every day | ORAL | Status: DC

## 2015-10-17 MED ORDER — HYDROXYZINE HCL 25 MG PO TABS: 25.0000 mg | ORAL_TABLET | Freq: Three times a day (TID) | ORAL | Status: DC | PRN

## 2015-10-17 MED ORDER — AMLODIPINE BESYLATE 5 MG PO TABS: 5.0000 mg | ORAL_TABLET | Freq: Every day | ORAL | Status: DC

## 2015-10-17 NOTE — Telephone Encounter (Signed)
See other entry 

## 2015-10-17 NOTE — Telephone Encounter (Signed)
Spoke with patient and INR 2.2 at Oncology today Will continue to monitor blood pressure at home and will call back if no improvement prior to follow up with Pharm D in blood pressure clinic

## 2015-10-17 NOTE — Telephone Encounter (Signed)
Follow up      Please call pt on her cell phone. She called earlier to talk to the nurse

## 2015-10-17 NOTE — Progress Notes (Signed)
Patient ID: ILANNA DEIHL                 DOB: 04-Feb-1927                      MRN: [id number]     HPI: Deborah [name] is a 83 y.o. female patient of Dr. Oval Linsey who presents for hypertension follow-up. When I previously saw her she had been experiencing dizziness and was confused with her medication dosing. I had stopped her amlodipine at that time and decreased her carvedilol because she was having symptomatic bradycardia.   I spoke to her not long ago about her INR and at that time her pressures were doing well. Since then, she has been experiencing high blood pressures with headaches over the last several days. She has used clonidine X3 since her pressures have been high. She states she has not been feeling very well over the last few days.   She has taken tylenol for relief of her headache which she states is now just dull and achey.   Cardiac HX: HTN, HLD, chronic AF, stroke, mechanical AVR, hypothyroidism, GI bleed requiring transfusions.   Current HTN meds:  Carvedilol 6.'25mg'$  BID Hydrochlorothiazide 12.'5mg'$  once daily in the evening Valsartan '320mg'$  once daily in the morning Spironolactone '25mg'$  BID  Previously tried: amlodipine '10mg'$  - caused swelling but ok on '5mg'$  daily  BP goal: <150/90  Family History: father passed from MI at 34  Social History: no tobacco (quit in 1970's), drinks 1 mixed drink each night, occasional coffee  Diet: still cooks most of her meals despite living at [employer], eats in the dining room 1-2 times per week; only uses light salt on some foods  Exercise: does stretching exercises daily, walks daily and stays active throughout the day  Home BP readings: She has an omron arm cuff that read about 10 mmHg less than my measurement. This cuff is very old. She states it sometimes matches the readings from the nurse with [facility] and sometimes does not.   She reports that her pressures over the last day have been  7/17 0930  163/63 - before  meds  1630 182/99 7/18 0430  189/101 - before meds  0930  153/72 after meds    Wt Readings from Last 3 Encounters:  09/26/15 122 lb 6.4 oz (55.52 kg)  09/07/15 121 lb 6.4 oz (55.067 kg)  08/21/15 119 lb 12.8 oz (54.341 kg)   BP Readings from Last 3 Encounters:  09/29/15 154/77  09/28/15 159/71  09/26/15 122/78   Pulse Readings from Last 3 Encounters:  09/29/15 74  09/28/15 62  09/26/15 44    Renal function: CrCl cannot be calculated (Unknown ideal weight.).  Past Medical History  Diagnosis Date  . Hypothyroidism   . Stomach problems     seeing Dr Watt Climes  . Hypertension   . GERD (gastroesophageal reflux disease)   . Anemia   . Arrhythmia     Atrial Fibrillation  . Breast cancer ([place])     "right; S/P lumpectomy, chemo, XRT"   . High cholesterol   . Heart murmur   . History of blood transfusion 1996; ~ 2013    "related to valve replacement; GI bleeding"   . Chronic lower GI bleeding   . Arthritis     "fingers, toes, hips; qwhere" (05/19/2013)  . Gout   . Pneumonia 07/2011  . Rectus sheath hematoma 12/19/14     held anticoagulation for 1 week,  transfused  . TIA (transient ischemic attack) 12/24/14    symptoms resolved  . History of nuclear stress test     Myoview 1/17: EF 59%, normal perfusion, low risk  . History of echocardiogram     Echo 1/17: mild LVH, EF 50-55%, no RWMA, mild AI, mechanical AVR with mean 21 mmHg/peak 42 mmHg (stable since 2012), mod MR, mod to severe BAE, PASP 36 mmHg  . Complication of anesthesia     "once I'm awakened, I don't sleep til the following night"    Current Outpatient Prescriptions on File Prior to Visit  Medication Sig Dispense Refill  . acetaminophen (TYLENOL) 500 MG tablet Take 1,000 mg by mouth every 6 (six) hours as needed for mild pain, moderate pain or headache. Reported on 09/26/2015    . allopurinol (ZYLOPRIM) 300 MG tablet Take 300 mg by mouth daily.    . calcium-vitamin D 500 MG tablet Take 1 tablet by mouth 2 (two)  times daily. Reported on 09/26/2015    . carvedilol (COREG) 6.25 MG tablet Take 1 tablet (6.25 mg total) by mouth 2 (two) times daily. 60 tablet 3  . celecoxib (CELEBREX) 200 MG capsule Take 200 mg by mouth every other day.   4  . dicyclomine (BENTYL) 10 MG capsule Take 10 mg by mouth daily as needed for spasms.     . digoxin (LANOXIN) 0.125 MG tablet Take 0.125 mg by mouth daily.    . fexofenadine (ALLEGRA ALLERGY) 180 MG tablet Take 180 mg by mouth daily as needed for allergies.     . hydrochlorothiazide (HYDRODIURIL) 12.5 MG tablet Take 1 tablet (12.5 mg total) by mouth every evening. 30 tablet 3  . levothyroxine (SYNTHROID, LEVOTHROID) 88 MCG tablet Take 1 tablet (88 mcg total) by mouth daily before breakfast. 90 tablet 3  . multivitamin (THERAGRAN) per tablet Take 1 tablet by mouth daily. Reported on 09/26/2015    . NITROSTAT 0.4 MG SL tablet Place 0.4 mg under the tongue every 5 (five) minutes as needed for chest pain (x 3 doses). Reported on 09/26/2015  1  . Probiotic Product (PROBIOTIC ADVANCED PO) Take 1 tablet by mouth daily.    . simvastatin (ZOCOR) 20 MG tablet TAKE ONE-HALF TABLET DAILY 30 tablet 3  . spironolactone (ALDACTONE) 25 MG tablet Take 25 mg by mouth 2 (two) times daily.  5  . valsartan (DIOVAN) 320 MG tablet Take 1 tablet (320 mg total) by mouth daily. 30 tablet 5  . warfarin (COUMADIN) 5 MG tablet TAKE ONE TABLET BY MOUTH ONCE DAILY 30 tablet 3  . zolpidem (AMBIEN CR) 12.5 MG CR tablet Take 12.5 mg by mouth at bedtime as needed for sleep.     No current facility-administered medications on file prior to visit.    Allergies  Allergen Reactions  . Amiodarone Shortness Of Breath  . Alendronate Sodium Other (See Comments)    GI upset  . Macrodantin [Nitrofurantoin] Other (See Comments)    GI upset  . Meclizine Swelling    tongue  . Norvasc [Amlodipine Besylate] Swelling    edema  . Tussionex Pennkinetic Er [Hydrocod Polst-Cpm Polst Er] Other (See Comments)     Reaction unknown     Assessment/Plan: Hypertension:  BP is not at goal today and has been spiking in the evening at home. Will restart her amlodipine '5mg'$  once daily and take this in the morning to help with afternoon pressures. Will also increase her hydrochlorothiazide back to '25mg'$ . She is unsure when this  change occurred to the lower dose and looking back at one time she reported HCTZ '25mg'$  BID then the next visit she stated she was taking 12.'5mg'$  once daily (I think this may have also been a result of the confusion with dosing). Instructed her to take clonidine only if necessary for symptomatic BP >180/100. Hopefully with medication changes she will need less and ultimately have her come back off completely. Follow up in BP clinic in 2 weeks.   BMET today with labs from cancer center.   Thank you, Lelan Pons. Patterson Hammersmith, [facility]  10/17/2015 10:32 AM     Addendum: BMET resulted with low sodium and chloride. K ok. After consultation with Dr. Oval Linsey will stop spironolactone and continue HCTZ. Recheck BMET in 1 week and BP check in 2 weeks as scheduled.

## 2015-10-17 NOTE — Telephone Encounter (Signed)
INR 2.2  Current coumadin dose per pt is 5 mg alt 2.5 mg   Pt denies any dark stools, no abd discomfort. She does state increase in BP reading with cardiac MD " trying to tweak my medications "  Next lab is 7/25.  Pt will continue current regimen

## 2015-10-17 NOTE — Telephone Encounter (Signed)
Returning your call. °

## 2015-10-17 NOTE — Patient Instructions (Addendum)
Return for a a follow up appointment in 2-3 weeks  Your blood pressure today is 158/72  Check your blood pressure at home daily (if able) and keep record of the readings.  Take your BP meds as follows: Restart amlodipine '5mg'$  once daily  Increase Hydrochlorothiazide to '25mg'$  once daily Continue: Carvedilol 6.'25mg'$  twice daily Spironolactone '25mg'$  twice daily Valsartan '320mg'$  daily  Bring all of your meds, your BP cuff and your record of home blood pressures to your next appointment.  Exercise as you're able, try to walk approximately 30 minutes per day.  Keep salt intake to a minimum, especially watch canned and prepared boxed foods.  Eat more fresh fruits and vegetables and fewer canned items.  Avoid eating in fast food restaurants.    HOW TO TAKE YOUR BLOOD PRESSURE: . Rest 5 minutes before taking your blood pressure. .  Don't smoke or drink caffeinated beverages for at least 30 minutes before. . Take your blood pressure before (not after) you eat. . Sit comfortably with your back supported and both feet on the floor (don't cross your legs). . Elevate your arm to heart level on a table or a desk. . Use the proper sized cuff. It should fit smoothly and snugly around your bare upper arm. There should be enough room to slip a fingertip under the cuff. The bottom edge of the cuff should be 1 inch above the crease of the elbow. . Ideally, take 3 measurements at one sitting and record the average.

## 2015-10-18 LAB — BASIC METABOLIC PANEL
BUN: 19 mg/dL (ref 7–25)
CO2: 28 mmol/L (ref 20–31)
CREATININE: 0.91 mg/dL — AB (ref 0.60–0.88)
Calcium: 10.3 mg/dL (ref 8.6–10.4)
Chloride: 92 mmol/L — ABNORMAL LOW (ref 98–110)
Glucose, Bld: 104 mg/dL — ABNORMAL HIGH (ref 65–99)
Potassium: 4.9 mmol/L (ref 3.5–5.3)
Sodium: 126 mmol/L — ABNORMAL LOW (ref 135–146)

## 2015-10-19 ENCOUNTER — Other Ambulatory Visit: Payer: Medicare Other

## 2015-10-24 ENCOUNTER — Other Ambulatory Visit: Payer: Medicare Other

## 2015-10-25 ENCOUNTER — Telehealth: Payer: Self-pay | Admitting: Oncology

## 2015-10-25 ENCOUNTER — Telehealth: Payer: Self-pay | Admitting: Pharmacist Clinician (PhC)/ Clinical Pharmacy Specialist

## 2015-10-25 ENCOUNTER — Other Ambulatory Visit (HOSPITAL_BASED_OUTPATIENT_CLINIC_OR_DEPARTMENT_OTHER): Payer: Medicare Other

## 2015-10-25 ENCOUNTER — Telehealth: Payer: Self-pay | Admitting: *Deleted

## 2015-10-25 DIAGNOSIS — I4891 Unspecified atrial fibrillation: Secondary | ICD-10-CM

## 2015-10-25 DIAGNOSIS — D62 Acute posthemorrhagic anemia: Secondary | ICD-10-CM

## 2015-10-25 DIAGNOSIS — D500 Iron deficiency anemia secondary to blood loss (chronic): Secondary | ICD-10-CM

## 2015-10-25 DIAGNOSIS — D689 Coagulation defect, unspecified: Secondary | ICD-10-CM

## 2015-10-25 DIAGNOSIS — K921 Melena: Secondary | ICD-10-CM

## 2015-10-25 DIAGNOSIS — I4819 Other persistent atrial fibrillation: Secondary | ICD-10-CM

## 2015-10-25 LAB — CBC WITH DIFFERENTIAL/PLATELET
BASO%: 0.6 % (ref 0.0–2.0)
BASOS ABS: 0 10*3/uL (ref 0.0–0.1)
EOS ABS: 0.1 10*3/uL (ref 0.0–0.5)
EOS%: 1.1 % (ref 0.0–7.0)
HCT: 32 % — ABNORMAL LOW (ref 34.8–46.6)
HEMOGLOBIN: 10.7 g/dL — AB (ref 11.6–15.9)
LYMPH%: 11 % — AB (ref 14.0–49.7)
MCH: 33.4 pg (ref 25.1–34.0)
MCHC: 33.4 g/dL (ref 31.5–36.0)
MCV: 99.9 fL (ref 79.5–101.0)
MONO#: 0.3 10*3/uL (ref 0.1–0.9)
MONO%: 6.1 % (ref 0.0–14.0)
NEUT#: 4.2 10*3/uL (ref 1.5–6.5)
NEUT%: 81.2 % — ABNORMAL HIGH (ref 38.4–76.8)
Platelets: 145 10*3/uL (ref 145–400)
RBC: 3.2 10*6/uL — AB (ref 3.70–5.45)
RDW: 19.1 % — AB (ref 11.2–14.5)
WBC: 5.1 10*3/uL (ref 3.9–10.3)
lymph#: 0.6 10*3/uL — ABNORMAL LOW (ref 0.9–3.3)

## 2015-10-25 LAB — PROTIME-INR
INR: 2 (ref 2.00–3.50)
PROTIME: 24 s — AB (ref 10.6–13.4)

## 2015-10-25 NOTE — Telephone Encounter (Signed)
Spoke with pt to confirm 7/28 appt date/times

## 2015-10-25 NOTE — Telephone Encounter (Signed)
INR 2.0  Coumadin dose is 5 mg alt with 2.5 mg   Heme decreased to 10.7 with pt reporting recurrence of dark stools daily - no obvious bright red blood " just very dark "  Plan per MD review is to continue above dose and recheck lab on 7/28 with possible need to transfuse.  inbox request sent for appointment.

## 2015-10-25 NOTE — Telephone Encounter (Signed)
Patient called to report an increase in swelling in her ankles/feet since stopping spironolactone (caused her potassium to increase).  Most recent potassium level 1 week ago was good at 4.9.    She has furosemide 40 mg tablets that were previously discontinued, advised she take 1 tablet daily for 2 days only.  If swelling still a problem on Friday she should call back to Dr. Blenda Mounts nurse.  Patient voiced understanding

## 2015-10-26 ENCOUNTER — Telehealth: Payer: Self-pay | Admitting: Pharmacist

## 2015-10-26 ENCOUNTER — Other Ambulatory Visit: Payer: Medicare Other

## 2015-10-26 LAB — BASIC METABOLIC PANEL
BUN: 30 mg/dL — ABNORMAL HIGH (ref 7–25)
CALCIUM: 10.2 mg/dL (ref 8.6–10.4)
CO2: 28 mmol/L (ref 20–31)
Chloride: 98 mmol/L (ref 98–110)
Creat: 0.92 mg/dL — ABNORMAL HIGH (ref 0.60–0.88)
GLUCOSE: 102 mg/dL — AB (ref 65–99)
POTASSIUM: 4.3 mmol/L (ref 3.5–5.3)
SODIUM: 133 mmol/L — AB (ref 135–146)

## 2015-10-26 NOTE — Telephone Encounter (Signed)
Returned call to nurse with [place]. Patient came to her clinic today to have blood pressure check. Deborah [name] , nurse reports that initially her pressure was 118/58. Upon recheck a few minutes later her pressure was increased to 138/61 with her machine. Her HR was 54 and irregular. Patient voiced concern about taking her Valsartan and carvedilol. She denied any symptoms associated with low blood pressure and swelling.   Nurse is unable to make recommendations as far as medications so she is wishing for me to call and discuss with patient.    Called patient - she reports she was concerned with first measurement. She also reports she believes it came back up since she was very excited to tell the nurse good news about an event within her family.   She states that she also is expecting a transfusion tomorrow as her Hgb continues to drop.  She is unsure if the hgb or the blood pressure has been causing her to be swimmy headed the last few days.  I suspect this is more related to her low hgb but will have her half her valsartan today and call if she has any additional issues. She has a follow up with HTN clinic next week on 10/31/15.

## 2015-10-27 ENCOUNTER — Ambulatory Visit (HOSPITAL_COMMUNITY)
Admission: RE | Admit: 2015-10-27 | Discharge: 2015-10-27 | Disposition: A | Discharge status: Home / Self Care | Payer: Medicare Other | Source: Ambulatory Visit | Attending: Oncology | Admitting: Oncology

## 2015-10-27 ENCOUNTER — Other Ambulatory Visit: Payer: Medicare Other

## 2015-10-27 ENCOUNTER — Other Ambulatory Visit: Payer: Self-pay | Admitting: *Deleted

## 2015-10-27 ENCOUNTER — Other Ambulatory Visit (HOSPITAL_BASED_OUTPATIENT_CLINIC_OR_DEPARTMENT_OTHER): Payer: Medicare Other

## 2015-10-27 ENCOUNTER — Ambulatory Visit (HOSPITAL_BASED_OUTPATIENT_CLINIC_OR_DEPARTMENT_OTHER): Payer: Medicare Other

## 2015-10-27 DIAGNOSIS — D649 Anemia, unspecified: Secondary | ICD-10-CM | POA: Insufficient documentation

## 2015-10-27 DIAGNOSIS — D500 Iron deficiency anemia secondary to blood loss (chronic): Secondary | ICD-10-CM | POA: Diagnosis present

## 2015-10-27 DIAGNOSIS — D689 Coagulation defect, unspecified: Secondary | ICD-10-CM

## 2015-10-27 DIAGNOSIS — I4819 Other persistent atrial fibrillation: Secondary | ICD-10-CM

## 2015-10-27 DIAGNOSIS — D62 Acute posthemorrhagic anemia: Secondary | ICD-10-CM

## 2015-10-27 DIAGNOSIS — K921 Melena: Secondary | ICD-10-CM

## 2015-10-27 DIAGNOSIS — I482 Chronic atrial fibrillation: Secondary | ICD-10-CM | POA: Diagnosis not present

## 2015-10-27 LAB — CBC WITH DIFFERENTIAL/PLATELET
BASO%: 0.4 % (ref 0.0–2.0)
BASOS ABS: 0 10*3/uL (ref 0.0–0.1)
EOS ABS: 0.1 10*3/uL (ref 0.0–0.5)
EOS%: 1.8 % (ref 0.0–7.0)
HEMATOCRIT: 28.8 % — AB (ref 34.8–46.6)
HEMOGLOBIN: 9.7 g/dL — AB (ref 11.6–15.9)
LYMPH#: 0.7 10*3/uL — AB (ref 0.9–3.3)
LYMPH%: 13.7 % — ABNORMAL LOW (ref 14.0–49.7)
MCH: 33 pg (ref 25.1–34.0)
MCHC: 33.7 g/dL (ref 31.5–36.0)
MCV: 98 fL (ref 79.5–101.0)
MONO#: 0.3 10*3/uL (ref 0.1–0.9)
MONO%: 6.5 % (ref 0.0–14.0)
NEUT%: 77.6 % — ABNORMAL HIGH (ref 38.4–76.8)
NEUTROS ABS: 4 10*3/uL (ref 1.5–6.5)
Platelets: 139 10*3/uL — ABNORMAL LOW (ref 145–400)
RBC: 2.94 10*6/uL — ABNORMAL LOW (ref 3.70–5.45)
RDW: 17.2 % — AB (ref 11.2–14.5)
WBC: 5.1 10*3/uL (ref 3.9–10.3)

## 2015-10-27 LAB — PREPARE RBC (CROSSMATCH)

## 2015-10-27 LAB — PROTIME-INR
INR: 2.4 (ref 2.00–3.50)
Protime: 28.8 Seconds — ABNORMAL HIGH (ref 10.6–13.4)

## 2015-10-27 MED ORDER — DIPHENHYDRAMINE HCL 25 MG PO CAPS
25.0000 mg | ORAL_CAPSULE | Freq: Once | ORAL | Status: AC
  Administered 2015-10-27: 25 mg via ORAL

## 2015-10-27 MED ORDER — ACETAMINOPHEN 325 MG PO TABS
650.0000 mg | ORAL_TABLET | Freq: Once | ORAL | Status: AC
  Administered 2015-10-27: 650 mg via ORAL

## 2015-10-27 MED ORDER — DIPHENHYDRAMINE HCL 25 MG PO CAPS
ORAL_CAPSULE | ORAL | Status: AC
  Filled 2015-10-27: qty 1

## 2015-10-27 MED ORDER — ACETAMINOPHEN 325 MG PO TABS
ORAL_TABLET | ORAL | Status: AC
  Filled 2015-10-27: qty 2

## 2015-10-27 MED ORDER — SODIUM CHLORIDE 0.9 % IV SOLN
250.0000 mL | Freq: Once | INTRAVENOUS | Status: AC
  Administered 2015-10-27: 250 mL via INTRAVENOUS

## 2015-10-27 MED ORDER — SODIUM CHLORIDE 0.9% FLUSH
10.0000 mL | INTRAVENOUS | Status: AC | PRN
  Administered 2015-10-27: 10 mL
  Filled 2015-10-27: qty 10

## 2015-10-27 MED ORDER — HEPARIN SOD (PORK) LOCK FLUSH 100 UNIT/ML IV SOLN
500.0000 [IU] | Freq: Every day | INTRAVENOUS | Status: AC | PRN
  Administered 2015-10-27: 500 [IU]
  Filled 2015-10-27: qty 5

## 2015-10-27 NOTE — Patient Instructions (Signed)

## 2015-10-29 LAB — TYPE AND SCREEN
ABO/RH(D): O POS
ANTIBODY SCREEN: NEGATIVE
UNIT DIVISION: 0
UNIT DIVISION: 0

## 2015-10-31 ENCOUNTER — Ambulatory Visit (INDEPENDENT_AMBULATORY_CARE_PROVIDER_SITE_OTHER): Payer: Medicare Other | Admitting: Pharmacist

## 2015-10-31 ENCOUNTER — Other Ambulatory Visit (HOSPITAL_BASED_OUTPATIENT_CLINIC_OR_DEPARTMENT_OTHER): Payer: Medicare Other

## 2015-10-31 ENCOUNTER — Other Ambulatory Visit: Payer: Self-pay

## 2015-10-31 VITALS — BP 132/70 | HR 53 | Wt 124.8 lb

## 2015-10-31 DIAGNOSIS — I6330 Cerebral infarction due to thrombosis of unspecified cerebral artery: Secondary | ICD-10-CM | POA: Diagnosis not present

## 2015-10-31 DIAGNOSIS — K921 Melena: Secondary | ICD-10-CM

## 2015-10-31 DIAGNOSIS — I482 Chronic atrial fibrillation: Secondary | ICD-10-CM | POA: Diagnosis not present

## 2015-10-31 DIAGNOSIS — I10 Essential (primary) hypertension: Secondary | ICD-10-CM | POA: Diagnosis not present

## 2015-10-31 DIAGNOSIS — I4819 Other persistent atrial fibrillation: Secondary | ICD-10-CM

## 2015-10-31 DIAGNOSIS — K922 Gastrointestinal hemorrhage, unspecified: Secondary | ICD-10-CM

## 2015-10-31 DIAGNOSIS — D500 Iron deficiency anemia secondary to blood loss (chronic): Secondary | ICD-10-CM

## 2015-10-31 DIAGNOSIS — D689 Coagulation defect, unspecified: Secondary | ICD-10-CM

## 2015-10-31 DIAGNOSIS — D62 Acute posthemorrhagic anemia: Secondary | ICD-10-CM

## 2015-10-31 LAB — CBC WITH DIFFERENTIAL/PLATELET
BASO%: 0.3 % (ref 0.0–2.0)
BASOS ABS: 0 10*3/uL (ref 0.0–0.1)
EOS%: 1 % (ref 0.0–7.0)
Eosinophils Absolute: 0.1 10*3/uL (ref 0.0–0.5)
HCT: 31.3 % — ABNORMAL LOW (ref 34.8–46.6)
HEMOGLOBIN: 10.6 g/dL — AB (ref 11.6–15.9)
LYMPH#: 0.6 10*3/uL — AB (ref 0.9–3.3)
LYMPH%: 10 % — ABNORMAL LOW (ref 14.0–49.7)
MCH: 32.9 pg (ref 25.1–34.0)
MCHC: 33.9 g/dL (ref 31.5–36.0)
MCV: 97.2 fL (ref 79.5–101.0)
MONO#: 0.5 10*3/uL (ref 0.1–0.9)
MONO%: 7.9 % (ref 0.0–14.0)
NEUT#: 4.7 10*3/uL (ref 1.5–6.5)
NEUT%: 80.8 % — AB (ref 38.4–76.8)
Platelets: 141 10*3/uL — ABNORMAL LOW (ref 145–400)
RBC: 3.22 10*6/uL — ABNORMAL LOW (ref 3.70–5.45)
RDW: 17.6 % — ABNORMAL HIGH (ref 11.2–14.5)
WBC: 5.8 10*3/uL (ref 3.9–10.3)
nRBC: 0 % (ref 0–0)

## 2015-10-31 LAB — PROTIME-INR
INR: 2.9 (ref 2.00–3.50)
PROTIME: 34.8 s — AB (ref 10.6–13.4)

## 2015-10-31 NOTE — Patient Instructions (Signed)
Return for a a follow up appointment in 1 week with Dr. Oval Linsey. Follow up with blood pressure clinic as needed.   Check your blood pressure at home daily (if able) and keep record of the readings.  Take your BP meds as follows: Continue same medications   Bring all of your meds, your BP cuff and your record of home blood pressures to your next appointment.  Exercise as you're able, try to walk approximately 30 minutes per day.  Keep salt intake to a minimum, especially watch canned and prepared boxed foods.  Eat more fresh fruits and vegetables and fewer canned items.  Avoid eating in fast food restaurants.    HOW TO TAKE YOUR BLOOD PRESSURE: . Rest 5 minutes before taking your blood pressure. .  Don't smoke or drink caffeinated beverages for at least 30 minutes before. . Take your blood pressure before (not after) you eat. . Sit comfortably with your back supported and both feet on the floor (don't cross your legs). . Elevate your arm to heart level on a table or a desk. . Use the proper sized cuff. It should fit smoothly and snugly around your bare upper arm. There should be enough room to slip a fingertip under the cuff. The bottom edge of the cuff should be 1 inch above the crease of the elbow. . Ideally, take 3 measurements at one sitting and record the average.

## 2015-10-31 NOTE — Progress Notes (Signed)
Patient ID: LURLIE [name]                 DOB: [birth year]                      MRN: [id number]     HPI: Deborah [name] is a 82 y.o. female patient of Dr. Oval Linsey with PMH below who presents today for hypertension follow-up. She presents today and states that she has been feeling poorly. She has been having dark stools again and had a transfusion 2 units on Thursday. Her Hgb did not rebound as much as they anticipated with the transfusion.   At her last visit with me her sodium was found to be low and spironolactone was discontinued.   She took her lasix the day after her transfusion due to swelling.   She also has had stomach pains which she is unsure if this is related to her transfusion or the GI bleeding. She is to be evaluated by GI again on the 17th of this month.   Cardiac HX: HTN, HLD, chronic AF, stroke, mechanical AVR, hypothyroidism, GI bleed requiring transfusions.   Current HTN meds:  Amlodipine '5mg'$  daily Carvedilol 6.'25mg'$  BID Hydrochlorothiazide 12.'5mg'$  once daily in the evening Valsartan '320mg'$  once daily in the morning  Previously tried: amlodipine '10mg'$  - caused swelling but ok on '5mg'$  daily  BP goal: <150/90  Family History: father passed from MI at 72  Social History: no tobacco (quit in 1970's), drinks 1 mixed drink each night, occasional coffee  Diet: still cooks most of her meals despite living at [employer], eats in the dining room 1-2 times per week; only uses light salt on some foods  Exercise: does stretching exercises daily, walks daily and stays active throughout the day   Home BP readings: has been checking at [place] she reports it has been running in the 140s or 150s.   Wt Readings from Last 3 Encounters:  10/17/15 122 lb 9.6 oz (55.6 kg)  09/26/15 122 lb 6.4 oz (55.5 kg)  09/07/15 121 lb 6.4 oz (55.1 kg)   BP Readings from Last 3 Encounters:  10/27/15 (!) 160/68  10/17/15 (!) 147/78  09/29/15 (!) 154/77   Pulse Readings from Last  3 Encounters:  10/27/15 64  10/17/15 (!) 48  09/29/15 74    Renal function: CrCl cannot be calculated (Unknown ideal weight.).  Past Medical History:  Diagnosis Date  . Anemia   . Arrhythmia    Atrial Fibrillation  . Arthritis    "fingers, toes, hips; qwhere" (05/19/2013)  . Breast cancer ([place])    "right; S/P lumpectomy, chemo, XRT"   . Chronic lower GI bleeding   . Complication of anesthesia    "once I'm awakened, I don't sleep til the following night"  . GERD (gastroesophageal reflux disease)   . Gout   . Heart murmur   . High cholesterol   . History of blood transfusion 1996; ~ 2013   "related to valve replacement; GI bleeding"   . History of echocardiogram    Echo 1/17: mild LVH, EF 50-55%, no RWMA, mild AI, mechanical AVR with mean 21 mmHg/peak 42 mmHg (stable since 2012), mod MR, mod to severe BAE, PASP 36 mmHg  . History of nuclear stress test    Myoview 1/17: EF 59%, normal perfusion, low risk  . Hypertension   . Hypothyroidism   . Pneumonia 07/2011  . Rectus sheath hematoma 12/19/14    held anticoagulation for 1  week, transfused  . Stomach problems    seeing Dr Watt Climes  . TIA (transient ischemic attack) 12/24/14   symptoms resolved    Current Outpatient Prescriptions on File Prior to Visit  Medication Sig Dispense Refill  . acetaminophen (TYLENOL) 500 MG tablet Take 1,000 mg by mouth every 6 (six) hours as needed for mild pain, moderate pain or headache. Reported on 09/26/2015    . allopurinol (ZYLOPRIM) 300 MG tablet Take 300 mg by mouth daily.    Marland Kitchen amLODipine (NORVASC) 5 MG tablet Take 1 tablet (5 mg total) by mouth daily. 180 tablet 3  . calcium-vitamin D 500 MG tablet Take 1 tablet by mouth 2 (two) times daily. Reported on 09/26/2015    . carvedilol (COREG) 6.25 MG tablet Take 1 tablet (6.25 mg total) by mouth 2 (two) times daily. 60 tablet 3  . celecoxib (CELEBREX) 200 MG capsule Take 200 mg by mouth every other day.   4  . dicyclomine (BENTYL) 10 MG capsule  Take 10 mg by mouth daily as needed for spasms.     . digoxin (LANOXIN) 0.125 MG tablet Take 0.125 mg by mouth daily.    . fexofenadine (ALLEGRA ALLERGY) 180 MG tablet Take 180 mg by mouth daily as needed for allergies.     . furosemide (LASIX) 40 MG tablet Take 1 tablet (40 mg total) by mouth daily. 30 tablet 3  . hydrochlorothiazide (HYDRODIURIL) 25 MG tablet Take 1 tablet (25 mg total) by mouth daily. 90 tablet 3  . hydrOXYzine (ATARAX/VISTARIL) 25 MG tablet Take 1 tablet (25 mg total) by mouth 3 (three) times daily as needed. 30 tablet 0  . levothyroxine (SYNTHROID, LEVOTHROID) 88 MCG tablet Take 1 tablet (88 mcg total) by mouth daily before breakfast. 90 tablet 3  . NITROSTAT 0.4 MG SL tablet Place 0.4 mg under the tongue every 5 (five) minutes as needed for chest pain (x 3 doses). Reported on 10/17/2015  1  . Probiotic Product (PROBIOTIC ADVANCED PO) Take 1 tablet by mouth daily. Reported on 10/17/2015    . simvastatin (ZOCOR) 20 MG tablet TAKE ONE-HALF TABLET DAILY 30 tablet 3  . spironolactone (ALDACTONE) 25 MG tablet Take 25 mg by mouth 2 (two) times daily.  5  . valsartan (DIOVAN) 320 MG tablet Take 1 tablet (320 mg total) by mouth daily. 30 tablet 5  . warfarin (COUMADIN) 5 MG tablet TAKE ONE TABLET BY MOUTH ONCE DAILY 30 tablet 3  . zolpidem (AMBIEN CR) 12.5 MG CR tablet Take 12.5 mg by mouth at bedtime as needed for sleep.     No current facility-administered medications on file prior to visit.     Allergies  Allergen Reactions  . Amiodarone Shortness Of Breath  . Alendronate Sodium Other (See Comments)    GI upset  . Macrodantin [Nitrofurantoin] Other (See Comments)    GI upset  . Meclizine Swelling    tongue  . Norvasc [Amlodipine Besylate] Swelling    edema  . Tussionex Pennkinetic Er [Hydrocod Polst-Cpm Polst Er] Other (See Comments)    Reaction unknown    There were no vitals taken for this visit.   Assessment/Plan: Hypertension: BP today is at goal. No medication  changes. At this point I believe it would be better to allow her blood pressure to run slightly higher to prevent any additional dizziness. Per her report she has not had readings >160. Since she currently has several different health issues will have her follow up with Dr. Oval Linsey as  scheduled and call us with any issues to be discussed over the phone.   Thank you, Lelan Pons. Patterson Hammersmith, [facility]  10/31/2015 11:33 AM

## 2015-11-02 ENCOUNTER — Other Ambulatory Visit (HOSPITAL_BASED_OUTPATIENT_CLINIC_OR_DEPARTMENT_OTHER): Payer: Medicare Other

## 2015-11-02 ENCOUNTER — Telehealth: Payer: Self-pay

## 2015-11-02 DIAGNOSIS — I482 Chronic atrial fibrillation: Secondary | ICD-10-CM

## 2015-11-02 DIAGNOSIS — D500 Iron deficiency anemia secondary to blood loss (chronic): Secondary | ICD-10-CM

## 2015-11-02 DIAGNOSIS — K921 Melena: Secondary | ICD-10-CM

## 2015-11-02 DIAGNOSIS — D689 Coagulation defect, unspecified: Secondary | ICD-10-CM

## 2015-11-02 DIAGNOSIS — D62 Acute posthemorrhagic anemia: Secondary | ICD-10-CM

## 2015-11-02 DIAGNOSIS — K922 Gastrointestinal hemorrhage, unspecified: Secondary | ICD-10-CM

## 2015-11-02 DIAGNOSIS — I4819 Other persistent atrial fibrillation: Secondary | ICD-10-CM

## 2015-11-02 LAB — CBC WITH DIFFERENTIAL/PLATELET
BASO%: 0.3 % (ref 0.0–2.0)
BASOS ABS: 0 10*3/uL (ref 0.0–0.1)
EOS%: 0.8 % (ref 0.0–7.0)
Eosinophils Absolute: 0 10*3/uL (ref 0.0–0.5)
HCT: 33.9 % — ABNORMAL LOW (ref 34.8–46.6)
HGB: 11 g/dL — ABNORMAL LOW (ref 11.6–15.9)
LYMPH#: 0.6 10*3/uL — AB (ref 0.9–3.3)
LYMPH%: 12.1 % — ABNORMAL LOW (ref 14.0–49.7)
MCH: 32.1 pg (ref 25.1–34.0)
MCHC: 32.5 g/dL (ref 31.5–36.0)
MCV: 98.8 fL (ref 79.5–101.0)
MONO#: 0.3 10*3/uL (ref 0.1–0.9)
MONO%: 7 % (ref 0.0–14.0)
NEUT%: 79.8 % — ABNORMAL HIGH (ref 38.4–76.8)
NEUTROS ABS: 3.9 10*3/uL (ref 1.5–6.5)
PLATELETS: 156 10*3/uL (ref 145–400)
RBC: 3.43 10*6/uL — AB (ref 3.70–5.45)
RDW: 18.9 % — AB (ref 11.2–14.5)
WBC: 4.9 10*3/uL (ref 3.9–10.3)

## 2015-11-02 LAB — PROTIME-INR
INR: 2 (ref 2.00–3.50)
Protime: 24 Seconds — ABNORMAL HIGH (ref 10.6–13.4)

## 2015-11-02 NOTE — Telephone Encounter (Signed)
Discussed cbc and inr with Dr Lindi Adie. Pt does not need blood today, continue warfarin as currently taking. 2.5 mg 4 days/week and 5 mg 3 days/week. S/w pt these instructions. Pt asked her next labs be drawn 8/9 rather than 8/10. pof sent.

## 2015-11-03 ENCOUNTER — Telehealth: Payer: Self-pay | Admitting: Oncology

## 2015-11-03 NOTE — Telephone Encounter (Signed)
Called patient several times to confirm appointment. Phone was busy! Appointment letter and schedule mailed. Deborah [name].

## 2015-11-05 NOTE — Progress Notes (Signed)
d   Cardiology Office Note   Date:  11/07/2015   ID:  Deborah [name], DOB [birth year], MRN [id number]  PCP:  Mathews Argyle, MD  Cardiologist:   Skeet Latch, MD  GI: Dr. Sarina Ser  No chief complaint on file.    History of Present Illness: Deborah [name] is a 82 y.o. female with hypertension, hyperlipidemia, chronic atrial fibrillation, stroke, hypothyroidism, prior mechanical aortic valve replacement, and NSVT who presents for follow-up.  Deborah [name] was previously a patient of Dr. Mare Ferrari.  Deborah [name] previously developed a retroperitoneal hematoma on warfarin in September 2006 and was temporarily switched to Lovenox. However she was transitioned back to warfarin. Deborah [name] underwent upper endoscopy on 07/25/15 that showed a small hiatal hernia.  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  Her anticoagulation is managed by her oncologist, Dr. Doreen Salvage.  Her INR goal is 2-2.5. She continues following up with her oncologist for management of her anemia and requires infusion of IV iron. She had a Lexiscan Cardiolite 04/2015 that was negative for ischemia. Her echo in 04/2015 showed mild to moderate aortic stenosis across her mechanical aortic valve (mean gradient 21 mmHg).  At her last appointment Deborah [name] reported high blood pressure readings and headaches that awaken her from sleep.  She was asked to start taking losartan and HCTZ at night rather than bid.  She slept better but noted that her BP started spiking in the afternoon instead.  Most of her BP readings have been in the 932T-557D systolic.  When her BP spikes she notes nausea and GI upset.  She also feels anxious and jittery.  She has not noted any chest pain or shortness of breath.  She also denies lower extremity edema, orthopnea or PND.  Deborah [name] continues to struggle with elevated BP.  Her blood pressure has been as high as 200/98.  She was  instructed to take an extra dose of carvedilol when her BP is this high.  She wakes up at night twice per week with headaches that she is to be started high blood pressure. During the day her blood pressures typically in the 220U to 542 systolic and her heart rate is in the 50s. She also continues to require transfusions for GI bleeding. She had 2 transfusions since her last appointment both in May. She was bleeding consistently for the last week, though this stopped 2 days ago. She plans to see Dr. Jason Coop next week.  Her hemoglobin is low she feels tired and short of breath.  The only time she takes Lasix is after getting 2 transfusions at one time.   Past Medical History:  Diagnosis Date  . Anemia   . Arrhythmia    Atrial Fibrillation  . Arthritis    "fingers, toes, hips; qwhere" (05/19/2013)  . Breast cancer ([place])    "right; S/P lumpectomy, chemo, XRT"   . Chronic lower GI bleeding   . Complication of anesthesia    "once I'm awakened, I don't sleep til the following night"  . GERD (gastroesophageal reflux disease)   . Gout   . Heart murmur   . High cholesterol   . History of blood transfusion 1996; ~ 2013   "related to valve replacement; GI bleeding"   . History of echocardiogram    Echo 1/17: mild LVH, EF 50-55%, no RWMA, mild AI, mechanical AVR with mean 21 mmHg/peak 42 mmHg (stable since 2012), mod MR, mod to severe  BAE, PASP 36 mmHg  . History of nuclear stress test    Myoview 1/17: EF 59%, normal perfusion, low risk  . Hypertension   . Hypothyroidism   . Pneumonia 07/2011  . Rectus sheath hematoma 12/19/14    held anticoagulation for 1 week, transfused  . Stomach problems    seeing Dr Watt Climes  . TIA (transient ischemic attack) 12/24/14   symptoms resolved    Past Surgical History:  Procedure Laterality Date  . ABDOMINAL HYSTERECTOMY    . AORTIC VALVE REPLACEMENT  1996   [employer]  . APPENDECTOMY    . arteriogram  01/12   NO RAS   . BREAST BIOPSY Right   . BREAST  LUMPECTOMY Right   . CARDIAC CATHETERIZATION    . CARDIOVERSION     X 2  . CATARACT EXTRACTION W/ INTRAOCULAR LENS  IMPLANT, BILATERAL Bilateral    bilateral cataract with lens implants  . CHOLECYSTECTOMY    . COLONOSCOPY WITH PROPOFOL N/A 11/24/2012   Procedure: COLONOSCOPY WITH PROPOFOL;  Surgeon: Jeryl Columbia, MD;  Location: [facility] ENDOSCOPY;  Service: Endoscopy;  Laterality: N/A;  . ESOPHAGOGASTRODUODENOSCOPY (EGD) WITH PROPOFOL N/A 07/25/2015   Procedure: ESOPHAGOGASTRODUODENOSCOPY (EGD) WITH PROPOFOL;  Surgeon: Clarene Essex, MD;  Location: [facility] ENDOSCOPY;  Service: Endoscopy;  Laterality: N/A;  . HOT HEMOSTASIS N/A 07/25/2015   Procedure: HOT HEMOSTASIS (ARGON PLASMA COAGULATION/BICAP);  Surgeon: Clarene Essex, MD;  Location: [facility] ENDOSCOPY;  Service: Endoscopy;  Laterality: N/A;     Current Outpatient Prescriptions  Medication Sig Dispense Refill  . acetaminophen (TYLENOL) 500 MG tablet Take 1,000 mg by mouth every 6 (six) hours as needed for mild pain, moderate pain or headache. Reported on 09/26/2015    . allopurinol (ZYLOPRIM) 300 MG tablet Take 300 mg by mouth daily.    Marland Kitchen amLODipine (NORVASC) 5 MG tablet Take 1 tablet (5 mg total) by mouth daily. 180 tablet 3  . calcium-vitamin D 500 MG tablet Take 1 tablet by mouth 2 (two) times daily. Reported on 09/26/2015    . carvedilol (COREG) 6.25 MG tablet Take 1 tablet (6.25 mg total) by mouth 2 (two) times daily. 60 tablet 3  . celecoxib (CELEBREX) 200 MG capsule Take 200 mg by mouth every other day.   4  . dicyclomine (BENTYL) 10 MG capsule Take 10 mg by mouth daily as needed for spasms.     . digoxin (LANOXIN) 0.125 MG tablet Take 0.125 mg by mouth daily.    . fexofenadine (ALLEGRA ALLERGY) 180 MG tablet Take 180 mg by mouth daily as needed for allergies.     . furosemide (LASIX) 40 MG tablet Take 40 mg by mouth as needed for fluid.  30 tablet 3  . hydrochlorothiazide (HYDRODIURIL) 25 MG tablet Take 1 tablet (25 mg total) by mouth daily. 90 tablet 3    . hydrOXYzine (ATARAX/VISTARIL) 25 MG tablet Take 1 tablet (25 mg total) by mouth 3 (three) times daily as needed. 30 tablet 0  . levothyroxine (SYNTHROID, LEVOTHROID) 88 MCG tablet Take 1 tablet (88 mcg total) by mouth daily before breakfast. 90 tablet 3  . NITROSTAT 0.4 MG SL tablet Place 0.4 mg under the tongue every 5 (five) minutes as needed for chest pain (x 3 doses). Reported on 10/17/2015  1  . Probiotic Product (PROBIOTIC ADVANCED PO) Take 1 tablet by mouth daily. Reported on 10/17/2015    . simvastatin (ZOCOR) 20 MG tablet TAKE ONE-HALF TABLET DAILY 30 tablet 3  . valsartan (DIOVAN) 320 MG  tablet Take 1 tablet (320 mg total) by mouth daily. 30 tablet 5  . warfarin (COUMADIN) 5 MG tablet TAKE ONE TABLET BY MOUTH ONCE DAILY 30 tablet 3  . zolpidem (AMBIEN CR) 12.5 MG CR tablet Take 12.5 mg by mouth at bedtime as needed for sleep.    . hydrALAZINE (APRESOLINE) 10 MG tablet Take 1 tablet (10 mg total) by mouth 2 (two) times daily. 180 tablet 1   No current facility-administered medications for this visit.     Allergies:   Amiodarone; Alendronate sodium; Macrodantin [nitrofurantoin]; Meclizine; Norvasc [amlodipine besylate]; and Tussionex pennkinetic er [hydrocod polst-cpm polst er]    Social History:  The patient  reports that she quit smoking about 27 years ago. Her smoking use included Cigarettes. She has a 20.00 pack-year smoking history. She has never used smokeless tobacco. She reports that she drinks about 4.8 oz of alcohol per week . She reports that she does not use drugs.   Family History:  The patient's family history includes Breast cancer in her daughter; COPD in her brother; Colon cancer in her sister; Coronary artery disease in her father; Dementia in her mother; Liver cancer in her sister; Lung cancer in her brother.    ROS:  Please see the history of present illness.   Otherwise, review of systems are positive for none.   All other systems are reviewed and negative.     PHYSICAL EXAM: VS:  BP (!) 142/74   Pulse 64   Ht '5\' 1"'$  (1.549 m)   Wt 123 lb (55.8 kg)   SpO2 97%   BMI 23.24 kg/m  , BMI Body mass index is 23.24 kg/m. GENERAL:  Well appearing HEENT:  Pupils equal round and reactive, fundi not visualized, oral mucosa unremarkable.  Conjuntival pallor.  NECK:  No jugular venous distention, waveform within normal limits, carotid upstroke brisk and symmetric, no bruits, no thyromegaly LYMPHATICS:  No cervical adenopathy LUNGS:  Clear to auscultation bilaterally HEART:  RRR.  PMI not displaced or sustained,S1 within normal limits, mechanical S2, no S3, no S4, no clicks, no rubs, II/VI systolic murmur. ABD:  Flat, positive bowel sounds normal in frequency in pitch, no bruits, no rebound, no guarding, no midline pulsatile mass, no hepatomegaly, no splenomegaly EXT:  2 plus pulses throughout, no edema, no cyanosis no clubbing SKIN:  No rashes no nodules NEURO:  Cranial nerves II through XII grossly intact, motor grossly intact throughout PSYCH:  Cognitively intact, oriented to person place and time    EKG:  EKG is not ordered today. The ekg ordered 06/30/15 demonstrates atrial bigeminy.  Rate 75 bpm.  Prior septal infarct.  Lateral ST-T changes consistent with ischemia.   Echo 04/05/15: Study Conclusions  - Left ventricle: The cavity size was normal. There was mild   concentric hypertrophy. Systolic function was normal. The   estimated ejection fraction was in the range of 50% to 55%. Wall   motion was normal; there were no regional wall motion   abnormalities. The study is not technically sufficient to allow   evaluation of LV diastolic function. - Aortic valve: A mechanical prosthesis was present. The gradients   across the prosthesis are similar to the study of 2012. There was   mild regurgitation. Peak velocity (S): 325 cm/s. Mean gradient   (S): 21 mm Hg. - Mitral valve: There was moderate regurgitation. - Left atrium: The atrium was  moderately to severely dilated. - Right ventricle: The cavity size was moderately dilated. Wall  thickness was normal. - Right atrium: The atrium was moderately to severely dilated. - Pulmonary arteries: Systolic pressure was mildly increased. PA   peak pressure: 36 mm Hg (S).   Recent Labs: 03/07/2015: Brain Natriuretic Peptide 190.8 09/20/2015: ALT 21 10/25/2015: BUN 30; Creat 0.92; Potassium 4.3; Sodium 133 11/02/2015: HGB 11.0; Platelets 156    Lipid Panel    Component Value Date/Time   CHOL 138 12/26/2014 0356   TRIG 94 12/26/2014 0356   HDL 55 12/26/2014 0356   CHOLHDL 2.5 12/26/2014 0356   VLDL 19 12/26/2014 0356   LDLCALC 64 12/26/2014 0356   LDLDIRECT 93.7 04/10/2011 1000      Wt Readings from Last 3 Encounters:  11/07/15 123 lb (55.8 kg)  10/31/15 124 lb 12.8 oz (56.6 kg)  10/17/15 122 lb 9.6 oz (55.6 kg)      ASSESSMENT AND PLAN:  # Hypertension: Deborah [name] blood pressure Remained slightly above goal and she is waking up at night with elevated blood pressures. We will add hydralazine 10 mg twice daily.  Continue carvedilol, amlodipine, hydrochlorothiazide, and losartan.  She developed lower extremity edema with higher doses of amlodipine.  # Atrial fibrillation: Continue carvedilol. Continue anticoagulation with warfarin, INR goal 2-2.5.  This patients CHA2DS2-VASc Score and unadjusted Ischemic Stroke Rate (% per year) is equal to 4.8 % stroke rate/year from a score of 4  Above score calculated as 1 point each if present [CHF, HTN, DM, Vascular=MI/PAD/Aortic Plaque, Age if 65-74, or Female] Above score calculated as 2 points each if present [Age > 75, or Stroke/TIA/TE]    # s/p mechanical AVR: Stable with moderately increased gradients.  INR goal 2-2.5 due to GI bleeding requiring transfusions.    # Hyperlipidemia: We will check fasting lipids and a CMP.  She will continue simvastatin.  Current medicines are reviewed at length with the patient today.  The  patient does not have concerns regarding medicines.  The following changes have been made:  There are hydralazine 10 mg twice daily. Labs/ tests ordered today include:   Orders Placed This Encounter  Procedures  . Lipid panel  . Comprehensive Metabolic Panel (CMET)     Disposition:   FU with Jakaleb Payer C. Oval Linsey, MD, [facility] in 2 months.   This note was written with the assistance of speech recognition software.  Please excuse any transcriptional errors.  Signed, Takeyah Wieman C. Oval Linsey, MD, [facility]  11/07/2015 1:42 PM    [facility]

## 2015-11-07 ENCOUNTER — Telehealth: Payer: Self-pay | Admitting: Cardiovascular Disease

## 2015-11-07 ENCOUNTER — Ambulatory Visit (INDEPENDENT_AMBULATORY_CARE_PROVIDER_SITE_OTHER): Payer: Medicare Other | Admitting: Cardiovascular Disease

## 2015-11-07 ENCOUNTER — Encounter: Payer: Self-pay | Admitting: Cardiovascular Disease

## 2015-11-07 VITALS — BP 142/74 | HR 64 | Ht 61.0 in | Wt 123.0 lb

## 2015-11-07 DIAGNOSIS — Z952 Presence of prosthetic heart valve: Secondary | ICD-10-CM

## 2015-11-07 DIAGNOSIS — I10 Essential (primary) hypertension: Secondary | ICD-10-CM | POA: Diagnosis not present

## 2015-11-07 DIAGNOSIS — I6330 Cerebral infarction due to thrombosis of unspecified cerebral artery: Secondary | ICD-10-CM

## 2015-11-07 DIAGNOSIS — I119 Hypertensive heart disease without heart failure: Secondary | ICD-10-CM | POA: Diagnosis not present

## 2015-11-07 DIAGNOSIS — Z954 Presence of other heart-valve replacement: Secondary | ICD-10-CM | POA: Diagnosis not present

## 2015-11-07 DIAGNOSIS — E785 Hyperlipidemia, unspecified: Secondary | ICD-10-CM | POA: Diagnosis not present

## 2015-11-07 DIAGNOSIS — I482 Chronic atrial fibrillation, unspecified: Secondary | ICD-10-CM

## 2015-11-07 MED ORDER — HYDRALAZINE HCL 10 MG PO TABS: 10.0000 mg | ORAL_TABLET | Freq: Two times a day (BID) | ORAL | 1 refills | Status: DC

## 2015-11-07 NOTE — Telephone Encounter (Signed)
Returned patient call-pt just wanted to verify that the new medication that MD Oval Linsey started her on today (hydralazine '10mg'$  BID) is for BP.  Advised that this was correct and to call with further questions/concerns.

## 2015-11-07 NOTE — Telephone Encounter (Signed)
Deborah [name] is calling about the new medication that Dr. Oval Linsey prescribe for her today .Please call

## 2015-11-07 NOTE — Patient Instructions (Addendum)
Medication Instructions:  START HYDRALAZINE 10 MG TWICE A DAY  Labwork: FASTING LP/CMET TOMORROW   Testing/Procedures: NONE  Follow-Up: Your physician recommends that you schedule a follow-up appointment in: 2 MONTH OV  If you need a refill on your cardiac medications before your next appointment, please call your pharmacy.

## 2015-11-08 ENCOUNTER — Other Ambulatory Visit (HOSPITAL_BASED_OUTPATIENT_CLINIC_OR_DEPARTMENT_OTHER): Payer: Medicare Other

## 2015-11-08 ENCOUNTER — Other Ambulatory Visit: Payer: Self-pay | Admitting: Cardiovascular Disease

## 2015-11-08 DIAGNOSIS — K922 Gastrointestinal hemorrhage, unspecified: Secondary | ICD-10-CM

## 2015-11-08 DIAGNOSIS — I482 Chronic atrial fibrillation: Secondary | ICD-10-CM | POA: Diagnosis not present

## 2015-11-08 DIAGNOSIS — K921 Melena: Secondary | ICD-10-CM

## 2015-11-08 DIAGNOSIS — D62 Acute posthemorrhagic anemia: Secondary | ICD-10-CM

## 2015-11-08 DIAGNOSIS — D500 Iron deficiency anemia secondary to blood loss (chronic): Secondary | ICD-10-CM

## 2015-11-08 DIAGNOSIS — I4819 Other persistent atrial fibrillation: Secondary | ICD-10-CM

## 2015-11-08 DIAGNOSIS — D689 Coagulation defect, unspecified: Secondary | ICD-10-CM

## 2015-11-08 LAB — CBC WITH DIFFERENTIAL/PLATELET
BASO%: 0.4 % (ref 0.0–2.0)
Basophils Absolute: 0 10*3/uL (ref 0.0–0.1)
EOS ABS: 0 10*3/uL (ref 0.0–0.5)
EOS%: 1.1 % (ref 0.0–7.0)
HEMATOCRIT: 31.9 % — AB (ref 34.8–46.6)
HGB: 10.6 g/dL — ABNORMAL LOW (ref 11.6–15.9)
LYMPH#: 0.4 10*3/uL — AB (ref 0.9–3.3)
LYMPH%: 9.8 % — ABNORMAL LOW (ref 14.0–49.7)
MCH: 33 pg (ref 25.1–34.0)
MCHC: 33.2 g/dL (ref 31.5–36.0)
MCV: 99.4 fL (ref 79.5–101.0)
MONO#: 0.3 10*3/uL (ref 0.1–0.9)
MONO%: 7.2 % (ref 0.0–14.0)
NEUT#: 3.5 10*3/uL (ref 1.5–6.5)
NEUT%: 81.5 % — AB (ref 38.4–76.8)
PLATELETS: 171 10*3/uL (ref 145–400)
RBC: 3.21 10*6/uL — ABNORMAL LOW (ref 3.70–5.45)
RDW: 19.4 % — ABNORMAL HIGH (ref 11.2–14.5)
WBC: 4.3 10*3/uL (ref 3.9–10.3)

## 2015-11-08 LAB — COMPREHENSIVE METABOLIC PANEL
ALBUMIN: 3.5 g/dL (ref 3.5–5.0)
ALK PHOS: 47 U/L (ref 40–150)
ALT: 20 U/L (ref 0–55)
AST: 19 U/L (ref 5–34)
Anion Gap: 7 mEq/L (ref 3–11)
BUN: 23.2 mg/dL (ref 7.0–26.0)
CHLORIDE: 96 meq/L — AB (ref 98–109)
CO2: 29 meq/L (ref 22–29)
Calcium: 9.7 mg/dL (ref 8.4–10.4)
Creatinine: 0.9 mg/dL (ref 0.6–1.1)
EGFR: 58 mL/min/{1.73_m2} — AB (ref >90)
GLUCOSE: 105 mg/dL (ref 70–140)
POTASSIUM: 4.1 meq/L (ref 3.5–5.1)
SODIUM: 132 meq/L — AB (ref 136–145)
TOTAL PROTEIN: 6 g/dL — AB (ref 6.4–8.3)
Total Bilirubin: 0.39 mg/dL (ref 0.20–1.20)

## 2015-11-08 LAB — PROTIME-INR
INR: 2.2 (ref 2.00–3.50)
Protime: 26.4 Seconds — ABNORMAL HIGH (ref 10.6–13.4)

## 2015-11-09 ENCOUNTER — Other Ambulatory Visit: Payer: Medicare Other

## 2015-11-10 LAB — COMPREHENSIVE METABOLIC PANEL
ALBUMIN: 4.2 g/dL (ref 3.5–4.7)
ALT: 19 IU/L (ref 0–32)
AST: 19 IU/L (ref 0–40)
Albumin/Globulin Ratio: 2.8 — ABNORMAL HIGH (ref 1.2–2.2)
Alkaline Phosphatase: 46 IU/L (ref 39–117)
BUN / CREAT RATIO: 32 — AB (ref 12–28)
BUN: 23 mg/dL (ref 8–27)
Bilirubin Total: 0.3 mg/dL (ref 0.0–1.2)
CALCIUM: 9.5 mg/dL (ref 8.7–10.3)
CO2: 29 mmol/L (ref 18–29)
CREATININE: 0.73 mg/dL (ref 0.57–1.00)
Chloride: 92 mmol/L — ABNORMAL LOW (ref 96–106)
GFR calc non Af Amer: 75 mL/min/{1.73_m2} (ref >59)
GFR, EST AFRICAN AMERICAN: 86 mL/min/{1.73_m2} (ref >59)
GLUCOSE: 104 mg/dL — AB (ref 65–99)
Globulin, Total: 1.5 g/dL (ref 1.5–4.5)
Potassium: 4.2 mmol/L (ref 3.5–5.2)
Sodium: 132 mmol/L — ABNORMAL LOW (ref 134–144)
TOTAL PROTEIN: 5.7 g/dL — AB (ref 6.0–8.5)

## 2015-11-10 LAB — AMBIG ABBREV LP DEFAULT

## 2015-11-10 LAB — AMBIG ABBREV CMP14 DEFAULT

## 2015-11-10 LAB — LIPID PANEL W/O CHOL/HDL RATIO
Cholesterol, Total: 153 mg/dL (ref 100–199)
HDL: 59 mg/dL (ref >39)
LDL CALC: 66 mg/dL (ref 0–99)
Triglycerides: 139 mg/dL (ref 0–149)
VLDL CHOLESTEROL CAL: 28 mg/dL (ref 5–40)

## 2015-11-14 ENCOUNTER — Telehealth: Payer: Self-pay | Admitting: Pharmacist

## 2015-11-14 ENCOUNTER — Other Ambulatory Visit: Payer: Self-pay | Admitting: *Deleted

## 2015-11-14 ENCOUNTER — Telehealth: Payer: Self-pay | Admitting: Oncology

## 2015-11-14 ENCOUNTER — Telehealth: Payer: Self-pay | Admitting: *Deleted

## 2015-11-14 ENCOUNTER — Other Ambulatory Visit (HOSPITAL_BASED_OUTPATIENT_CLINIC_OR_DEPARTMENT_OTHER): Payer: Medicare Other

## 2015-11-14 DIAGNOSIS — D500 Iron deficiency anemia secondary to blood loss (chronic): Secondary | ICD-10-CM

## 2015-11-14 DIAGNOSIS — I482 Chronic atrial fibrillation: Secondary | ICD-10-CM

## 2015-11-14 DIAGNOSIS — D62 Acute posthemorrhagic anemia: Secondary | ICD-10-CM

## 2015-11-14 DIAGNOSIS — D689 Coagulation defect, unspecified: Secondary | ICD-10-CM

## 2015-11-14 DIAGNOSIS — K922 Gastrointestinal hemorrhage, unspecified: Secondary | ICD-10-CM

## 2015-11-14 DIAGNOSIS — I119 Hypertensive heart disease without heart failure: Secondary | ICD-10-CM

## 2015-11-14 DIAGNOSIS — I4819 Other persistent atrial fibrillation: Secondary | ICD-10-CM

## 2015-11-14 DIAGNOSIS — K921 Melena: Secondary | ICD-10-CM

## 2015-11-14 LAB — CBC WITH DIFFERENTIAL/PLATELET
BASO%: 0.2 % (ref 0.0–2.0)
Basophils Absolute: 0 10*3/uL (ref 0.0–0.1)
EOS%: 1.5 % (ref 0.0–7.0)
Eosinophils Absolute: 0.1 10*3/uL (ref 0.0–0.5)
HEMATOCRIT: 31.8 % — AB (ref 34.8–46.6)
HGB: 10.6 g/dL — ABNORMAL LOW (ref 11.6–15.9)
LYMPH%: 13.4 % — AB (ref 14.0–49.7)
MCH: 32.3 pg (ref 25.1–34.0)
MCHC: 33.3 g/dL (ref 31.5–36.0)
MCV: 97 fL (ref 79.5–101.0)
MONO#: 0.4 10*3/uL (ref 0.1–0.9)
MONO%: 9.2 % (ref 0.0–14.0)
NEUT%: 75.7 % (ref 38.4–76.8)
NEUTROS ABS: 3 10*3/uL (ref 1.5–6.5)
Platelets: 178 10*3/uL (ref 145–400)
RBC: 3.28 10*6/uL — AB (ref 3.70–5.45)
RDW: 16.8 % — ABNORMAL HIGH (ref 11.2–14.5)
WBC: 4 10*3/uL (ref 3.9–10.3)
lymph#: 0.5 10*3/uL — ABNORMAL LOW (ref 0.9–3.3)
nRBC: 0 % (ref 0–0)

## 2015-11-14 LAB — PROTIME-INR
INR: 2.2 (ref 2.00–3.50)
PROTIME: 26.4 s — AB (ref 10.6–13.4)

## 2015-11-14 LAB — FERRITIN: FERRITIN: 57 ng/mL (ref 9–269)

## 2015-11-14 NOTE — Telephone Encounter (Signed)
767 systolic bp - today at nurse check.   She has not been taking hydralazine because it caused her stomach discomfort and to feel terrible. She took her last dose of hydralazine on Thursday, since then she has steadily improved. She takes her other medications as follows:  Morning - valsartan , amlodipine, carvedilol  Night - hydrochlorothiazide, carvedilol    She has been experiencing SOB  And feels bloated. Will have her take lasix tonight and monitor BP over the next few days once fluid has been pulled off.   She will remain off hydralazine and call our office later this week if pressure remains >140.

## 2015-11-14 NOTE — Patient Instructions (Signed)
A Fib prophylaxis  INR 2.2 Coumadin dose is 2.5 mg 4 days a week with 5 mg other day Next lab 8/29  No bleeding - heme stable at 10.6 - ferritin pending. New B/P med started with Judson Roch stating " just has me not feeling good "  B/P checked here with reading of 160/80 with noted irregular rhythm.  Jancy requested above for reporting to cardiology.  No other needs at present- inbox request sent for scheduling.

## 2015-11-14 NOTE — Telephone Encounter (Signed)
lvm to inform pt of lab only appt 8/29 at 11 am

## 2015-11-14 NOTE — Telephone Encounter (Signed)
No entry 

## 2015-11-16 ENCOUNTER — Telehealth: Payer: Self-pay | Admitting: Oncology

## 2015-11-16 NOTE — Telephone Encounter (Signed)
Spoke with pt to confirm 8/18 appt at [facility]

## 2015-11-17 ENCOUNTER — Other Ambulatory Visit: Payer: Self-pay | Admitting: Oncology

## 2015-11-17 ENCOUNTER — Other Ambulatory Visit: Payer: Self-pay | Admitting: Pharmacist

## 2015-11-17 ENCOUNTER — Ambulatory Visit (HOSPITAL_COMMUNITY)
Admission: RE | Admit: 2015-11-17 | Discharge: 2015-11-17 | Disposition: A | Discharge status: Home / Self Care | Payer: Medicare Other | Source: Ambulatory Visit | Attending: Oncology | Admitting: Oncology

## 2015-11-17 ENCOUNTER — Telehealth: Payer: Self-pay

## 2015-11-17 ENCOUNTER — Other Ambulatory Visit: Payer: Self-pay

## 2015-11-17 DIAGNOSIS — D500 Iron deficiency anemia secondary to blood loss (chronic): Secondary | ICD-10-CM | POA: Diagnosis not present

## 2015-11-17 MED ORDER — SODIUM CHLORIDE 0.9% FLUSH
10.0000 mL | INTRAVENOUS | Status: AC | PRN
  Administered 2015-11-17: 10 mL

## 2015-11-17 MED ORDER — HEPARIN SOD (PORK) LOCK FLUSH 100 UNIT/ML IV SOLN
500.0000 [IU] | INTRAVENOUS | Status: AC | PRN
  Administered 2015-11-17: 500 [IU]
  Filled 2015-11-17: qty 5

## 2015-11-17 MED ORDER — SODIUM CHLORIDE 0.9 % IV SOLN
510.0000 mg | Freq: Once | INTRAVENOUS | Status: AC
  Administered 2015-11-17: 510 mg via INTRAVENOUS
  Filled 2015-11-17: qty 17

## 2015-11-17 NOTE — Telephone Encounter (Signed)
Pt called from home. She is having blurred vision. She had feraheme today. The blurriness started a short while ago. Pt denies any SOB, swollen tongue, closing throat, hives, itching. S/w Dr Jana Hakim and instructed pt to drink large glass of water, lay down for 20 minutes and call us back. She agreed.

## 2015-11-17 NOTE — Progress Notes (Signed)
Provider: [name], MD  Associated Diagnosis: Iron Deficiency Anemia due to chronic blood loss  Procedure: Infusion of Feraheme 510 mg IVPB via Portacath as ordered. Port flushed and deaccessed at end of treatment. Patient tolerated infusion well. Patient observed for 30 minutes post infusion. No reactions. Went over discharge instructions and copy given to patient. Alert, oriented and ambulatory at time of discharge. Discharged to home.

## 2015-11-17 NOTE — Discharge Instructions (Signed)
Feraheme Iron infusion

## 2015-11-17 NOTE — Telephone Encounter (Signed)
Pt called back and stated she is feeling better, the vision is not as blurry and she is less woozy headed. Instructed her to drink another glass of water and call if any worsening happens.

## 2015-11-18 ENCOUNTER — Ambulatory Visit: Payer: Medicare Other

## 2015-11-21 ENCOUNTER — Ambulatory Visit: Payer: Medicare Other

## 2015-11-22 ENCOUNTER — Telehealth: Payer: Self-pay | Admitting: Cardiovascular Disease

## 2015-11-22 ENCOUNTER — Telehealth: Payer: Self-pay | Admitting: Oncology

## 2015-11-22 ENCOUNTER — Other Ambulatory Visit: Payer: Self-pay | Admitting: Oncology

## 2015-11-22 NOTE — Telephone Encounter (Signed)
Spoke to patient, who was put through directly to speak to me regarding her symptoms of shortness of breath. She asked to speak to [place], I informed her Rip Harbour was off today. Pt was willing to discuss w/ me.  She states she has felt short of breath yesterday and today, "feels full", also experiencing some URI symptoms/nasal congestion. She is not experiencing any chest pain, rapid HR, etc. She notes high BP reading this AM, she notes this is typical, and the reading was obtained prior to AM meds. She cannot identify any LE or abdominal swelling; Notes her fingers/hands are puffy. She has lasix '40mg'$  PRN which she has not taken today or yesterday.  I advised to take the lasix, may need to take tomorrow also. Advised also to consider allergy relief - she has Rx for this which she takes PRN.  Pt very reluctant to go to ER, however, I did advise her to do so should her shortness of breath remain unimproved. She voiced understanding. Asked that Dr. Oval Linsey give any further recommendations. Pt aware we may need to get her in w/ extender - she sees Dr. Oval Linsey next in October.

## 2015-11-22 NOTE — Telephone Encounter (Signed)
Spoke to patient. She reports some relief w lasix as recommended this AM.  I have asked her to update [place] tomorrow AM if she is still feeling unwell.  She voiced understanding, asked if Dr. Oval Linsey or APP could see her this week. I did not have a scheduler (after 5pm) - also unable to find 24 hr spot when I checked. Informed her I am unable to schedule APP visit at this time but would see if triage and scheduling could coordinate on this in the morning. She had asked to have [facility] call, but is aware I am off tomorrow and that Rip Harbour is out as well. Will see if triage RN on staff can assist. Pt reminded that if her symptoms are worse she should go to ER. She voiced understanding. Routed to Dr. Oval Linsey and triage.

## 2015-11-22 NOTE — Telephone Encounter (Signed)
Spoke with pt to confirm 8/25 appt per LOS

## 2015-11-22 NOTE — Telephone Encounter (Signed)
New message      Pt called stating that she is SOB and needs to talk to her about medication. Please call.   Pt c/o Shortness Of Breath: STAT if SOB developed within the last 24 hours or pt is noticeably SOB on the phone  1. Are you currently SOB (can you hear that pt is SOB on the phone)?  yes 2. How long have you been experiencing SOB? Yesterday and today  3. Are you SOB when sitting or when up moving around?  More when in moving around  4. Are you currently experiencing any other symptoms?  BP is high

## 2015-11-22 NOTE — Telephone Encounter (Signed)
F/u ° ° ° ° ° °Pt returning nurse call.  °

## 2015-11-23 ENCOUNTER — Ambulatory Visit (INDEPENDENT_AMBULATORY_CARE_PROVIDER_SITE_OTHER): Payer: Medicare Other | Admitting: Cardiology

## 2015-11-23 ENCOUNTER — Encounter: Payer: Self-pay | Admitting: Cardiology

## 2015-11-23 VITALS — BP 150/70 | HR 55 | Ht 61.0 in | Wt 121.8 lb

## 2015-11-23 DIAGNOSIS — R0600 Dyspnea, unspecified: Secondary | ICD-10-CM

## 2015-11-23 DIAGNOSIS — I6330 Cerebral infarction due to thrombosis of unspecified cerebral artery: Secondary | ICD-10-CM

## 2015-11-23 DIAGNOSIS — I10 Essential (primary) hypertension: Secondary | ICD-10-CM

## 2015-11-23 LAB — BASIC METABOLIC PANEL
BUN: 21 mg/dL (ref 7–25)
CHLORIDE: 91 mmol/L — AB (ref 98–110)
CO2: 28 mmol/L (ref 20–31)
CREATININE: 0.89 mg/dL — AB (ref 0.60–0.88)
Calcium: 9.8 mg/dL (ref 8.6–10.4)
Glucose, Bld: 134 mg/dL — ABNORMAL HIGH (ref 65–99)
POTASSIUM: 3.3 mmol/L — AB (ref 3.5–5.3)
Sodium: 128 mmol/L — ABNORMAL LOW (ref 135–146)

## 2015-11-23 MED ORDER — FUROSEMIDE 40 MG PO TABS: 40.0000 mg | ORAL_TABLET | Freq: Every day | ORAL | 3 refills | Status: DC

## 2015-11-23 NOTE — Patient Instructions (Signed)
Medication Instructions:  1. STOP HCTZ  2. START LASIX 40 MG DAILY; NEW RX HAS BEEN SENT IN  Labwork: TODAY BMET, BNP  YOU HAVE BEEN GIVEN A PRESCRIPTION TO HAVE LAB WORK IN 1 WEEK (BMET); PLEASE HAVE RESULTS FAXED TO [place], [place]; PLEASE HAVE THIS LAB WORK DONE WHEN YOU SEE YOUR HEMATOLOGIST  Testing/Procedures: NONE  Follow-Up: KEEP YOUR APPT WITH DR. [place] 01/09/16  Any Other Special Instructions Will Be Listed Below (If Applicable).     If you need a refill on your cardiac medications before your next appointment, please call your pharmacy.

## 2015-11-23 NOTE — Progress Notes (Signed)
11/23/2015 [name]   [birth year]  [id number]  Primary Physician Mathews Argyle, MD Primary Cardiologist: Dr. Oval Linsey  Reason for Visit/CC: Follow-up for dyspnea  HPI:  The patient is a 82 year old female followed by Dr. Oval Linsey. She was previously followed by Dr. Mare Ferrari. Her past medical history significant for hypertension, hyperlipidemia, chronic atrial fibrillation, stroke, hypothyroidism, prior mechanical aortic valve replacement, and NSVT. She is on Coumadin for anticoagulation. Her INR is followed by her hematologist. She also gets periodic IV iron infusions given chronic anemia.  She had a Lexiscan Cardiolite 04/2015 that was negative for ischemia. Her echo in 04/2015 showed mild to moderate aortic stenosis across her mechanical aortic valve (mean gradient 21 mmHg).  She was recently seen by Dr. Oval Linsey on 11/07/2015 and was noted to be doing fairly well from a cardiac perspective. Her blood pressure was moderately elevated and hydralazine, 10 mg 3 times a day was added to her regimen. She was instructed to follow-up in 2 months. However she called the office yesterday with complaints of dyspnea, and was added on to my schedule today. She states she has felt short of breath yesterday and today, "feels full", also experiencing some URI symptoms/nasal congestion. She is not experiencing any chest pain, rapid HR, etc. She notes high BP reading this AM, she notes this is typical, and the reading was obtained prior to AM meds. She cannot identify any LE or abdominal swelling; Notes her fingers/hands are puffy. She has lasix '40mg'$  PRN. She was advised by triage nurse to take Lasix.  She is here in clinic today accompanied by her daughter. She only took one dose of Lasix yesterday and has noticed mild improvement. Her fingers are less swollen but she continues to have mild abdominal distention. This is improved since taking Lasix. She denies any lower extremity edema. No orthopnea or  PND. She notes dyspnea at rest, although mild. Somewhat worse with exertion. She denies any associated chest pain. No recent prolonged travel. No leg pain. She does have chronic anemia which is followed closely by her hematologist. She has received iron infusion last Friday. Her hemoglobin at that time was 10.6 which is her baseline. She also notes symptoms of fatigue. No syncope/near-syncope. Her blood pressure in clinic today is moderately elevated at 150/70. Does report that she discontinued hydralazine due to intolerance (nausea).     Current Outpatient Prescriptions  Medication Sig Dispense Refill  . acetaminophen (TYLENOL) 500 MG tablet Take 1,000 mg by mouth every 6 (six) hours as needed for mild pain, moderate pain or headache. Reported on 09/26/2015    . allopurinol (ZYLOPRIM) 300 MG tablet Take 300 mg by mouth daily.    Marland Kitchen amLODipine (NORVASC) 5 MG tablet Take 1 tablet (5 mg total) by mouth daily. 180 tablet 3  . calcium-vitamin D 500 MG tablet Take 1 tablet by mouth 2 (two) times daily. Reported on 09/26/2015    . carvedilol (COREG) 6.25 MG tablet Take 1 tablet (6.25 mg total) by mouth 2 (two) times daily. 60 tablet 3  . celecoxib (CELEBREX) 200 MG capsule Take 200 mg by mouth every other day.   4  . dicyclomine (BENTYL) 10 MG capsule Take 10 mg by mouth daily as needed for spasms.     . digoxin (LANOXIN) 0.125 MG tablet Take 0.125 mg by mouth daily.    . fexofenadine (ALLEGRA ALLERGY) 180 MG tablet Take 180 mg by mouth daily as needed for allergies.     . hydrOXYzine (ATARAX/VISTARIL) 25  MG tablet Take 25 mg by mouth 3 (three) times daily as needed for anxiety.    Marland Kitchen levothyroxine (SYNTHROID, LEVOTHROID) 88 MCG tablet Take 1 tablet (88 mcg total) by mouth daily before breakfast. 90 tablet 3  . Multiple Vitamin (MULTIVITAMIN) tablet Take 1 tablet by mouth daily.    Marland Kitchen NITROSTAT 0.4 MG SL tablet Place 0.4 mg under the tongue every 5 (five) minutes as needed for chest pain (x 3 doses).  Reported on 10/17/2015  1  . Probiotic Product (PROBIOTIC ADVANCED PO) Take 1 tablet by mouth daily. Reported on 10/17/2015    . simvastatin (ZOCOR) 20 MG tablet TAKE ONE-HALF TABLET DAILY 30 tablet 3  . valsartan (DIOVAN) 320 MG tablet Take 1 tablet (320 mg total) by mouth daily. 30 tablet 5  . warfarin (COUMADIN) 5 MG tablet TAKE ONE TABLET BY MOUTH ONCE DAILY 30 tablet 3  . zolpidem (AMBIEN CR) 12.5 MG CR tablet Take 12.5 mg by mouth at bedtime as needed for sleep.    . furosemide (LASIX) 40 MG tablet Take 1 tablet (40 mg total) by mouth daily. 90 tablet 3   No current facility-administered medications for this visit.     Allergies  Allergen Reactions  . Amiodarone Shortness Of Breath  . Alendronate Sodium Other (See Comments)    GI upset  . Macrodantin [Nitrofurantoin] Other (See Comments)    GI upset  . Meclizine Swelling    tongue  . Norvasc [Amlodipine Besylate] Swelling    edema  . Hydralazine Hcl Nausea Only  . Tussionex Pennkinetic Er [Hydrocod Polst-Cpm Polst Er] Other (See Comments)    Reaction unknown    Social History   Social History  . Marital status: Widowed    Spouse name: N/A  . Number of children: N/A  . Years of education: N/A   Occupational History  . Not on file.   Social History Main Topics  . Smoking status: Former Smoker    Packs/day: 1.00    Years: 20.00    Types: Cigarettes    Quit date: 04/01/1988  . Smokeless tobacco: Never Used  . Alcohol use 4.8 oz/week    4 Glasses of wine, 4 Shots of liquor per week     Comment: burbon or glass wine daily  . Drug use: No  . Sexual activity: No   Other Topics Concern  . Not on file   Social History Narrative  . No narrative on file     Review of Systems: General: negative for chills, fever, night sweats or weight changes.  Cardiovascular: negative for chest pain, dyspnea on exertion, edema, orthopnea, palpitations, paroxysmal nocturnal dyspnea or shortness of breath Dermatological: negative for  rash Respiratory: negative for cough or wheezing Urologic: negative for hematuria Abdominal: negative for nausea, vomiting, diarrhea, bright red blood per rectum, melena, or hematemesis Neurologic: negative for visual changes, syncope, or dizziness All other systems reviewed and are otherwise negative except as noted above.    Blood pressure (!) 150/70, pulse (!) 55, height '5\' 1"'$  (1.549 m), weight 121 lb 12.8 oz (55.2 kg).  General appearance: alert, cooperative and no distress Neck: no carotid bruit and no JVD Lungs: clear to auscultation bilaterally Heart: irregularly irregular rhythm Extremities: no LEE Pulses: 2+ and symmetric Skin: warm and dry Neurologic: Grossly normal  EKG atrial fibrillation with SVR 55 bpm.   ASSESSMENT AND PLAN:   1. Dyspnea: Lungs are clear to auscultation bilaterally. No significant peripheral edema. The patient does report increased abdominal fullness/distention.  We will check a BNP today. Change diuretic from HCTZ to Lasix. If elevated BNP, we will order a 2D echo to assess for changes in LVF and to assess status of aortic valve. Her dyspnea may also be secondary to chronic anemia. She requires frequent IV iron infusions. She reports that she does typically require blood transfusions when her hemoglobin drops below 10.6. This is followed closely by her hematologist. Recent CBC 6 days ago demonstrated hemoglobin of 10.6. She has follow-up scheduled with her hematologist again next week.  2. HTN: blood pressure is mildly elevated at 150/70. Medications have been reviewed. Resting bradycardia with a heart rate of 55 bpm prohibits further titration of her carvedilol. We'll continue with current dose of 6.25 mg twice a day. She is intolerant to higher doses of amlodipine due to bilateral ankle edema in the past. She is able to tolerate low dose of 5 mg daily, which she is currently on. She recently tried hydralazine and was intolerant to this due to nausea. She has  discontinued this. She is on the max dose of valsartan. She was also intolerant to spironolactone due to hyperkalemia. She is currently on low-dose hydrochlorothiazide and only takes Lasix when necessary. Given increased dyspnea, abdominal distention and elevated blood pressure we will discontinue her hydrochlorothiazide and have her take a more potent diuretic. We'll add Lasix 40 mg daily. We will check a BMP today as well as a follow-up BMP in 1 week to ensure that renal function and electrolytes are stable.  3. chronic Atrial Fibrillation: Rate is well controlled in the mid 33s. She is asymptomatic. She is on chronic Coumadin. INRs are followed by her hematologist.  PLAN  Stop HCTZ,. Start lasix 40 mg daily. Repeat BMP in 1 week. Add supplemental K if needed. Keep f/u with Dr. Oval Linsey 01/09/16.   Lyda Jester [facility] 11/23/2015 3:43 PM

## 2015-11-23 NOTE — Telephone Encounter (Signed)
Spoke with daughter-daughter very concerned and would like patient (her mother) to be seen today.  Reports continued SOB and fluctuating BP readings.  Reports s/sx are not worse this AM (see telephone note 8/23) but would like to be seen.   Appt made with Ellen Henri PA today at 2pm at [facility] location.  Daughter verbalized understanding.

## 2015-11-23 NOTE — Telephone Encounter (Signed)
Follow up        Daughter insist on talking to someone now. She states her mother needs an appt today for sob and bp going up then down then back up again

## 2015-11-24 ENCOUNTER — Other Ambulatory Visit: Payer: Self-pay | Admitting: *Deleted

## 2015-11-24 ENCOUNTER — Ambulatory Visit (HOSPITAL_BASED_OUTPATIENT_CLINIC_OR_DEPARTMENT_OTHER): Payer: Medicare Other

## 2015-11-24 ENCOUNTER — Other Ambulatory Visit: Payer: Self-pay | Admitting: Oncology

## 2015-11-24 ENCOUNTER — Telehealth: Payer: Self-pay | Admitting: Cardiovascular Disease

## 2015-11-24 VITALS — BP 157/72 | HR 63 | Temp 97.8°F | Resp 18

## 2015-11-24 DIAGNOSIS — R5381 Other malaise: Secondary | ICD-10-CM

## 2015-11-24 DIAGNOSIS — R5383 Other fatigue: Principal | ICD-10-CM

## 2015-11-24 DIAGNOSIS — D689 Coagulation defect, unspecified: Secondary | ICD-10-CM

## 2015-11-24 DIAGNOSIS — D500 Iron deficiency anemia secondary to blood loss (chronic): Secondary | ICD-10-CM

## 2015-11-24 DIAGNOSIS — I482 Chronic atrial fibrillation: Secondary | ICD-10-CM | POA: Diagnosis not present

## 2015-11-24 DIAGNOSIS — K922 Gastrointestinal hemorrhage, unspecified: Secondary | ICD-10-CM | POA: Diagnosis not present

## 2015-11-24 LAB — BRAIN NATRIURETIC PEPTIDE: Brain Natriuretic Peptide: 331.2 pg/mL — ABNORMAL HIGH (ref <100)

## 2015-11-24 LAB — DRAW EXTRA CLOT TUBE

## 2015-11-24 LAB — CBC WITH DIFFERENTIAL/PLATELET
BASO%: 0.6 % (ref 0.0–2.0)
BASOS ABS: 0 10*3/uL (ref 0.0–0.1)
EOS%: 1.1 % (ref 0.0–7.0)
Eosinophils Absolute: 0.1 10*3/uL (ref 0.0–0.5)
HCT: 34.8 % (ref 34.8–46.6)
HGB: 11.4 g/dL — ABNORMAL LOW (ref 11.6–15.9)
LYMPH%: 10.3 % — ABNORMAL LOW (ref 14.0–49.7)
MCH: 31.8 pg (ref 25.1–34.0)
MCHC: 32.6 g/dL (ref 31.5–36.0)
MCV: 97.4 fL (ref 79.5–101.0)
MONO#: 0.4 10*3/uL (ref 0.1–0.9)
MONO%: 8.2 % (ref 0.0–14.0)
NEUT#: 4.3 10*3/uL (ref 1.5–6.5)
NEUT%: 79.8 % — AB (ref 38.4–76.8)
Platelets: 196 10*3/uL (ref 145–400)
RBC: 3.58 10*6/uL — ABNORMAL LOW (ref 3.70–5.45)
RDW: 17.5 % — AB (ref 11.2–14.5)
WBC: 5.4 10*3/uL (ref 3.9–10.3)
lymph#: 0.6 10*3/uL — ABNORMAL LOW (ref 0.9–3.3)

## 2015-11-24 LAB — PROTIME-INR
INR: 2.2 (ref 2.00–3.50)
PROTIME: 26.4 s — AB (ref 10.6–13.4)

## 2015-11-24 MED ORDER — POTASSIUM CHLORIDE CRYS ER 20 MEQ PO TBCR: 40.0000 meq | EXTENDED_RELEASE_TABLET | Freq: Every day | ORAL | 5 refills | Status: DC

## 2015-11-24 MED ORDER — SODIUM CHLORIDE 0.9 % IV SOLN
510.0000 mg | Freq: Once | INTRAVENOUS | Status: AC
  Administered 2015-11-24: 510 mg via INTRAVENOUS
  Filled 2015-11-24: qty 17

## 2015-11-24 MED ORDER — SODIUM CHLORIDE 0.9 % IV SOLN
INTRAVENOUS | Status: DC
  Administered 2015-11-24: 14:00:00 via INTRAVENOUS

## 2015-11-24 NOTE — Telephone Encounter (Signed)
Returned daughter's call - aware lab results pending provider review and I will make Dr. Oval Linsey aware they are anxious for results and any recommendations. She voiced thanks. May call the daughter back w/ advisement.

## 2015-11-24 NOTE — Patient Instructions (Signed)
Ferumoxytol injection What is this medicine? FERUMOXYTOL is an iron complex. Iron is used to make healthy red blood cells, which carry oxygen and nutrients throughout the body. This medicine is used to treat iron deficiency anemia in people with chronic kidney disease. This medicine may be used for other purposes; ask your health care provider or pharmacist if you have questions. What should I tell my health care provider before I take this medicine? They need to know if you have any of these conditions: -anemia not caused by low iron levels -high levels of iron in the blood -magnetic resonance imaging (MRI) test scheduled -an unusual or allergic reaction to iron, other medicines, foods, dyes, or preservatives -pregnant or trying to get pregnant -breast-feeding How should I use this medicine? This medicine is for injection into a vein. It is given by a health care professional in a hospital or clinic setting. Talk to your pediatrician regarding the use of this medicine in children. Special care may be needed. Overdosage: If you think you have taken too much of this medicine contact a poison control center or emergency room at once. NOTE: This medicine is only for you. Do not share this medicine with others. What if I miss a dose? It is important not to miss your dose. Call your doctor or health care professional if you are unable to keep an appointment. What may interact with this medicine? This medicine may interact with the following medications: -other iron products This list may not describe all possible interactions. Give your health care provider a list of all the medicines, herbs, non-prescription drugs, or dietary supplements you use. Also tell them if you smoke, drink alcohol, or use illegal drugs. Some items may interact with your medicine. What should I watch for while using this medicine? Visit your doctor or healthcare professional regularly. Tell your doctor or healthcare  professional if your symptoms do not start to get better or if they get worse. You may need blood work done while you are taking this medicine. You may need to follow a special diet. Talk to your doctor. Foods that contain iron include: whole grains/cereals, dried fruits, beans, or peas, leafy green vegetables, and organ meats (liver, kidney). What side effects may I notice from receiving this medicine? Side effects that you should report to your doctor or health care professional as soon as possible: -allergic reactions like skin rash, itching or hives, swelling of the face, lips, or tongue -breathing problems -changes in blood pressure -feeling faint or lightheaded, falls -fever or chills -flushing, sweating, or hot feelings -swelling of the ankles or feet Side effects that usually do not require medical attention (Report these to your doctor or health care professional if they continue or are bothersome.): -diarrhea -headache -nausea, vomiting -stomach pain This list may not describe all possible side effects. Call your doctor for medical advice about side effects. You may report side effects to FDA at 1-800-FDA-1088. Where should I keep my medicine? This drug is given in a hospital or clinic and will not be stored at home. NOTE: This sheet is a summary. It may not cover all possible information. If you have questions about this medicine, talk to your doctor, pharmacist, or health care provider.    2016, Elsevier/Gold Standard. (2011-11-01 15:23:36)  Dehydration, Adult Dehydration is a condition in which you do not have enough fluid or water in your body. It happens when you take in less fluid than you lose. Vital organs such as the kidneys,  brain, and heart cannot function without a proper amount of fluids. Any loss of fluids from the body can cause dehydration.  Dehydration can range from mild to severe. This condition should be treated right away to help prevent it from becoming  severe. CAUSES  This condition may be caused by:  Vomiting.  Diarrhea.  Excessive sweating, such as when exercising in hot or humid weather.  Not drinking enough fluid during strenuous exercise or during an illness.  Excessive urine output.  Fever.  Certain medicines. RISK FACTORS This condition is more likely to develop in:  People who are taking certain medicines that cause the body to lose excess fluid (diuretics).   People who have a chronic illness, such as diabetes, that may increase urination.  Older adults.   People who live at high altitudes.   People who participate in endurance sports.  SYMPTOMS  Mild Dehydration  Thirst.  Dry lips.  Slightly dry mouth.  Dry, warm skin. Moderate Dehydration  Very dry mouth.   Muscle cramps.   Dark urine and decreased urine production.   Decreased tear production.   Headache.   Light-headedness, especially when you stand up from a sitting position.  Severe Dehydration  Changes in skin.   Cold and clammy skin.   Skin does not spring back quickly when lightly pinched and released.   Changes in body fluids.   Extreme thirst.   No tears.   Not able to sweat when body temperature is high, such as in hot weather.   Minimal urine production.   Changes in vital signs.   Rapid, weak pulse (more than 100 beats per minute when you are sitting still).   Rapid breathing.   Low blood pressure.   Other changes.   Sunken eyes.   Cold hands and feet.   Confusion.  Lethargy and difficulty being awakened.  Fainting (syncope).   Short-term weight loss.   Unconsciousness. DIAGNOSIS  This condition may be diagnosed based on your symptoms. You may also have tests to determine how severe your dehydration is. These tests may include:   Urine tests.   Blood tests.  TREATMENT  Treatment for this condition depends on the severity. Mild or moderate dehydration can often be  treated at home. Treatment should be started right away. Do not wait until dehydration becomes severe. Severe dehydration needs to be treated at the hospital. Treatment for Mild Dehydration  Drinking plenty of water to replace the fluid you have lost.   Replacing minerals in your blood (electrolytes) that you may have lost.  Treatment for Moderate Dehydration  Consuming oral rehydration solution (ORS). Treatment for Severe Dehydration  Receiving fluid through an IV tube.   Receiving electrolyte solution through a feeding tube that is passed through your nose and into your stomach (nasogastric tube or NG tube).  Correcting any abnormalities in electrolytes. HOME CARE INSTRUCTIONS   Drink enough fluid to keep your urine clear or pale yellow.   Drink water or fluid slowly by taking small sips. You can also try sucking on ice cubes.  Have food or beverages that contain electrolytes. Examples include bananas and sports drinks.  Take over-the-counter and prescription medicines only as told by your health care provider.   Prepare ORS according to the manufacturer's instructions. Take sips of ORS every 5 minutes until your urine returns to normal.  If you have vomiting or diarrhea, continue to try to drink water, ORS, or both.   If you have diarrhea, avoid:  Beverages that contain caffeine.   Fruit juice.   Milk.   Carbonated soft drinks.  Do not take salt tablets. This can lead to the condition of having too much sodium in your body (hypernatremia).  SEEK MEDICAL CARE IF:  You cannot eat or drink without vomiting.  You have had moderate diarrhea during a period of more than 24 hours.  You have a fever. SEEK IMMEDIATE MEDICAL CARE IF:   You have extreme thirst.  You have severe diarrhea.  You have not urinated in 6-8 hours, or you have urinated only a small amount of very dark urine.  You have shriveled skin.  You are dizzy, confused, or both.   This  information is not intended to replace advice given to you by your health care provider. Make sure you discuss any questions you have with your health care provider.   Document Released: 03/18/2005 Document Revised: 12/07/2014 Document Reviewed: 08/03/2014 Elsevier Interactive Patient Education [employer].

## 2015-11-24 NOTE — Telephone Encounter (Signed)
Discussed labs with Dr Oval Linsey and will have patient start K+ 40 meq x 2 today and then 40 meq daily. See for ov Tuesday  Advised daughter, verbalized understanding

## 2015-11-24 NOTE — Telephone Encounter (Signed)
New message      Pt calling to get test results. Please call.

## 2015-11-27 ENCOUNTER — Telehealth: Payer: Self-pay | Admitting: *Deleted

## 2015-11-27 DIAGNOSIS — I4891 Unspecified atrial fibrillation: Secondary | ICD-10-CM

## 2015-11-27 NOTE — Telephone Encounter (Signed)
-----   Message from [place], [place] sent at 11/25/2015 11:30 AM EDT ----- BNP is elevated at 331, suggesting shortness of breath is related to acute heart failure. Continue to take lasix daily. Kidney function is normal. Potassium level is a bit on the low side. Start 40 mEq of supplemental potassium daily. Recheck BMP 1 week after starting K-dur. Order 2D echo to reassess pump function and status of aortic valve. Please call patient to notify of results.

## 2015-11-27 NOTE — Telephone Encounter (Signed)
Pt aware of labs.  Someone from [facility] had already called her Friday and called in potassium 20 meq 2 daily. Order for echo will be put in and she is aware someone will call and get that scheduled for her. She verbalized understanding.

## 2015-11-28 ENCOUNTER — Other Ambulatory Visit: Payer: Self-pay | Admitting: Cardiology

## 2015-11-28 ENCOUNTER — Ambulatory Visit: Payer: Medicare Other | Admitting: Cardiovascular Disease

## 2015-11-28 ENCOUNTER — Other Ambulatory Visit (HOSPITAL_BASED_OUTPATIENT_CLINIC_OR_DEPARTMENT_OTHER): Payer: Medicare Other

## 2015-11-28 ENCOUNTER — Ambulatory Visit (INDEPENDENT_AMBULATORY_CARE_PROVIDER_SITE_OTHER): Payer: Medicare Other | Admitting: Cardiovascular Disease

## 2015-11-28 ENCOUNTER — Telehealth: Payer: Self-pay | Admitting: *Deleted

## 2015-11-28 VITALS — BP 122/64 | HR 52 | Ht 60.0 in | Wt 121.6 lb

## 2015-11-28 DIAGNOSIS — I482 Chronic atrial fibrillation: Secondary | ICD-10-CM

## 2015-11-28 DIAGNOSIS — R0600 Dyspnea, unspecified: Secondary | ICD-10-CM

## 2015-11-28 DIAGNOSIS — D500 Iron deficiency anemia secondary to blood loss (chronic): Secondary | ICD-10-CM | POA: Diagnosis not present

## 2015-11-28 DIAGNOSIS — I4891 Unspecified atrial fibrillation: Secondary | ICD-10-CM | POA: Diagnosis not present

## 2015-11-28 DIAGNOSIS — I10 Essential (primary) hypertension: Secondary | ICD-10-CM

## 2015-11-28 DIAGNOSIS — D62 Acute posthemorrhagic anemia: Secondary | ICD-10-CM

## 2015-11-28 DIAGNOSIS — K922 Gastrointestinal hemorrhage, unspecified: Secondary | ICD-10-CM

## 2015-11-28 DIAGNOSIS — Z954 Presence of other heart-valve replacement: Secondary | ICD-10-CM

## 2015-11-28 DIAGNOSIS — K921 Melena: Secondary | ICD-10-CM

## 2015-11-28 DIAGNOSIS — I4819 Other persistent atrial fibrillation: Secondary | ICD-10-CM

## 2015-11-28 DIAGNOSIS — I119 Hypertensive heart disease without heart failure: Secondary | ICD-10-CM

## 2015-11-28 DIAGNOSIS — D689 Coagulation defect, unspecified: Secondary | ICD-10-CM

## 2015-11-28 DIAGNOSIS — Z79899 Other long term (current) drug therapy: Secondary | ICD-10-CM

## 2015-11-28 DIAGNOSIS — Z952 Presence of prosthetic heart valve: Secondary | ICD-10-CM

## 2015-11-28 DIAGNOSIS — I6330 Cerebral infarction due to thrombosis of unspecified cerebral artery: Secondary | ICD-10-CM

## 2015-11-28 LAB — CBC WITH DIFFERENTIAL/PLATELET
BASO%: 0 % (ref 0.0–2.0)
BASOS ABS: 0 10*3/uL (ref 0.0–0.1)
EOS ABS: 0.1 10*3/uL (ref 0.0–0.5)
EOS%: 1.2 % (ref 0.0–7.0)
HEMATOCRIT: 37.3 % (ref 34.8–46.6)
HEMOGLOBIN: 12.5 g/dL (ref 11.6–15.9)
LYMPH#: 0.5 10*3/uL — AB (ref 0.9–3.3)
LYMPH%: 10 % — ABNORMAL LOW (ref 14.0–49.7)
MCH: 32.6 pg (ref 25.1–34.0)
MCHC: 33.5 g/dL (ref 31.5–36.0)
MCV: 97.4 fL (ref 79.5–101.0)
MONO#: 0.5 10*3/uL (ref 0.1–0.9)
MONO%: 8.9 % (ref 0.0–14.0)
NEUT#: 4.2 10*3/uL (ref 1.5–6.5)
NEUT%: 79.9 % — AB (ref 38.4–76.8)
PLATELETS: 191 10*3/uL (ref 145–400)
RBC: 3.83 10*6/uL (ref 3.70–5.45)
RDW: 16.6 % — AB (ref 11.2–14.5)
WBC: 5.2 10*3/uL (ref 3.9–10.3)

## 2015-11-28 LAB — PROTIME-INR
INR: 2.1 (ref 2.00–3.50)
Protime: 25.2 Seconds — ABNORMAL HIGH (ref 10.6–13.4)

## 2015-11-28 LAB — FERRITIN: Ferritin: 684 ng/ml — ABNORMAL HIGH (ref 9–269)

## 2015-11-28 NOTE — Progress Notes (Signed)
d   Cardiology Office Note   Date:  11/28/2015   ID:  CAMBELLE SUCHECKI, DOB 04/26/1926, MRN [id number]  PCP:  Mathews Argyle, MD  Cardiologist:   Skeet Latch, MD  GI: Dr. Sarina Ser  No chief complaint on file.    History of Present Illness: Deborah [name] is a 82 y.o. female with hypertension, hyperlipidemia, chronic atrial fibrillation, stroke, hypothyroidism, prior mechanical aortic valve replacement, and NSVT who presents for follow-up.  Deborah [name] was previously a patient of Dr. Mare Ferrari.  Deborah [name] previously developed a retroperitoneal hematoma on warfarin in September 2006 and was temporarily switched to Lovenox. However she was transitioned back to warfarin. Deborah [name] underwent upper endoscopy on 07/25/15 that showed a small hiatal hernia.  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  Her anticoagulation is managed by her oncologist, Dr. Doreen Salvage.  Her INR goal is 2-2.5. She continues following up with her oncologist for management of her anemia and requires frequent infusion of IV iron. She had a Lexiscan Cardiolite 04/2015 that was negative for ischemia. Her echo in 04/2015 showed mild to moderate aortic stenosis across her mechanical aortic valve (mean gradient 21 mmHg).  At her last appointment hydralazine 10 mg bid was added.  However this caused stomach discomfort and she discontinued it. She noted increased swelling and shortness of breath and was seen by Ellen Henri on 11/23/15.  At that time she had a BNP that was 331 and potassium was 3.3.  She was started on lasix and HCTZ was discontinued.  She thinks that her blood pressure has been better-controlled and her shortness of breath has improved.  She has not been awakening with headaches and elevated blood pressure.  She denies chest pain, lower extremity edema, orthopnea or PND.  She has occasional swelling in her hands.   Past Medical History:   Diagnosis Date  . Anemia   . Arrhythmia    Atrial Fibrillation  . Arthritis    "fingers, toes, hips; qwhere" (05/19/2013)  . Breast cancer ([place])    "right; S/P lumpectomy, chemo, XRT"   . Chronic lower GI bleeding   . Complication of anesthesia    "once I'm awakened, I don't sleep til the following night"  . GERD (gastroesophageal reflux disease)   . Gout   . Heart murmur   . High cholesterol   . History of blood transfusion 1996; ~ 2013   "related to valve replacement; GI bleeding"   . History of echocardiogram    Echo 1/17: mild LVH, EF 50-55%, no RWMA, mild AI, mechanical AVR with mean 21 mmHg/peak 42 mmHg (stable since 2012), mod MR, mod to severe BAE, PASP 36 mmHg  . History of nuclear stress test    Myoview 1/17: EF 59%, normal perfusion, low risk  . Hypertension   . Hypothyroidism   . Pneumonia 07/2011  . Rectus sheath hematoma 12/19/14    held anticoagulation for 1 week, transfused  . Stomach problems    seeing Dr Watt Climes  . TIA (transient ischemic attack) 12/24/14   symptoms resolved    Past Surgical History:  Procedure Laterality Date  . ABDOMINAL HYSTERECTOMY    . AORTIC VALVE REPLACEMENT  1996   [employer]  . APPENDECTOMY    . arteriogram  01/12   NO RAS   . BREAST BIOPSY Right   . BREAST LUMPECTOMY Right   . CARDIAC CATHETERIZATION    . CARDIOVERSION  X 2  . CATARACT EXTRACTION W/ INTRAOCULAR LENS  IMPLANT, BILATERAL Bilateral    bilateral cataract with lens implants  . CHOLECYSTECTOMY    . COLONOSCOPY WITH PROPOFOL N/A 11/24/2012   Procedure: COLONOSCOPY WITH PROPOFOL;  Surgeon: Jeryl Columbia, MD;  Location: [facility] ENDOSCOPY;  Service: Endoscopy;  Laterality: N/A;  . ESOPHAGOGASTRODUODENOSCOPY (EGD) WITH PROPOFOL N/A 07/25/2015   Procedure: ESOPHAGOGASTRODUODENOSCOPY (EGD) WITH PROPOFOL;  Surgeon: Clarene Essex, MD;  Location: [facility] ENDOSCOPY;  Service: Endoscopy;  Laterality: N/A;  . HOT HEMOSTASIS N/A 07/25/2015   Procedure: HOT HEMOSTASIS (ARGON PLASMA  COAGULATION/BICAP);  Surgeon: Clarene Essex, MD;  Location: [facility] ENDOSCOPY;  Service: Endoscopy;  Laterality: N/A;     Current Outpatient Prescriptions  Medication Sig Dispense Refill  . acetaminophen (TYLENOL) 500 MG tablet Take 1,000 mg by mouth every 6 (six) hours as needed for mild pain, moderate pain or headache. Reported on 09/26/2015    . allopurinol (ZYLOPRIM) 300 MG tablet Take 300 mg by mouth daily.    Marland Kitchen amLODipine (NORVASC) 5 MG tablet Take 1 tablet (5 mg total) by mouth daily. 180 tablet 3  . calcium-vitamin D 500 MG tablet Take 1 tablet by mouth 2 (two) times daily. Reported on 09/26/2015    . carvedilol (COREG) 6.25 MG tablet Take 1 tablet (6.25 mg total) by mouth 2 (two) times daily. 60 tablet 3  . celecoxib (CELEBREX) 200 MG capsule Take 200 mg by mouth every other day.   4  . dicyclomine (BENTYL) 10 MG capsule Take 10 mg by mouth daily as needed for spasms.     . digoxin (LANOXIN) 0.125 MG tablet Take 0.125 mg by mouth daily.    . fexofenadine (ALLEGRA ALLERGY) 180 MG tablet Take 180 mg by mouth daily as needed for allergies.     . furosemide (LASIX) 40 MG tablet Take 1 tablet (40 mg total) by mouth daily. 90 tablet 3  . hydrOXYzine (ATARAX/VISTARIL) 25 MG tablet Take 25 mg by mouth 3 (three) times daily as needed for anxiety.    Marland Kitchen levothyroxine (SYNTHROID, LEVOTHROID) 88 MCG tablet Take 1 tablet (88 mcg total) by mouth daily before breakfast. 90 tablet 3  . Multiple Vitamin (MULTIVITAMIN) tablet Take 1 tablet by mouth daily.    Marland Kitchen NITROSTAT 0.4 MG SL tablet Place 0.4 mg under the tongue every 5 (five) minutes as needed for chest pain (x 3 doses). Reported on 10/17/2015  1  . potassium chloride SA (K-DUR,KLOR-CON) 20 MEQ tablet Take 2 tablets (40 mEq total) by mouth daily. 60 tablet 5  . Probiotic Product (PROBIOTIC ADVANCED PO) Take 1 tablet by mouth daily. Reported on 10/17/2015    . simvastatin (ZOCOR) 20 MG tablet TAKE ONE-HALF TABLET DAILY 30 tablet 3  . valsartan (DIOVAN) 320 MG  tablet Take 1 tablet (320 mg total) by mouth daily. 30 tablet 5  . warfarin (COUMADIN) 5 MG tablet TAKE ONE TABLET BY MOUTH ONCE DAILY 30 tablet 3  . zolpidem (AMBIEN CR) 12.5 MG CR tablet Take 12.5 mg by mouth at bedtime as needed for sleep.     No current facility-administered medications for this visit.     Allergies:   Amiodarone; Alendronate sodium; Macrodantin [nitrofurantoin]; Meclizine; Norvasc [amlodipine besylate]; Hydralazine hcl; and Tussionex pennkinetic er [hydrocod polst-cpm polst er]    Social History:  The patient  reports that she quit smoking about 27 years ago. Her smoking use included Cigarettes. She has a 20.00 pack-year smoking history. She has never used smokeless tobacco. She reports  that she drinks about 4.8 oz of alcohol per week . She reports that she does not use drugs.   Family History:  The patient's family history includes Breast cancer in her daughter; COPD in her brother; Colon cancer in her sister; Coronary artery disease in her father; Dementia in her mother; Heart attack in her father; Liver cancer in her sister; Lung cancer in her brother.    ROS:  Please see the history of present illness.   Otherwise, review of systems are positive for none.   All other systems are reviewed and negative.    PHYSICAL EXAM: VS:  There were no vitals taken for this visit. , BMI There is no height or weight on file to calculate BMI. GENERAL:  Well appearing HEENT:  Pupils equal round and reactive, fundi not visualized, oral mucosa unremarkable.  Conjuntival pallor.  NECK:  No jugular venous distention, waveform within normal limits, carotid upstroke brisk and symmetric, no bruits, no thyromegaly LYMPHATICS:  No cervical adenopathy LUNGS:  Clear to auscultation bilaterally HEART:  RRR.  PMI not displaced or sustained,S1 within normal limits, mechanical S2, no S3, no S4, no clicks, no rubs, II/VI systolic murmur. ABD:  Flat, positive bowel sounds normal in frequency in  pitch, no bruits, no rebound, no guarding, no midline pulsatile mass, no hepatomegaly, no splenomegaly EXT:  2 plus pulses throughout, no edema, no cyanosis no clubbing SKIN:  No rashes no nodules NEURO:  Cranial nerves II through XII grossly intact, motor grossly intact throughout PSYCH:  Cognitively intact, oriented to person place and time    EKG:  EKG is not ordered today. The ekg ordered 06/30/15 demonstrates atrial bigeminy.  Rate 75 bpm.  Prior septal infarct.  Lateral ST-T changes consistent with ischemia.   Echo 04/05/15: Study Conclusions  - Left ventricle: The cavity size was normal. There was mild   concentric hypertrophy. Systolic function was normal. The   estimated ejection fraction was in the range of 50% to 55%. Wall   motion was normal; there were no regional wall motion   abnormalities. The study is not technically sufficient to allow   evaluation of LV diastolic function. - Aortic valve: A mechanical prosthesis was present. The gradients   across the prosthesis are similar to the study of 2012. There was   mild regurgitation. Peak velocity (S): 325 cm/s. Mean gradient   (S): 21 mm Hg. - Mitral valve: There was moderate regurgitation. - Left atrium: The atrium was moderately to severely dilated. - Right ventricle: The cavity size was moderately dilated. Wall   thickness was normal. - Right atrium: The atrium was moderately to severely dilated. - Pulmonary arteries: Systolic pressure was mildly increased. PA   peak pressure: 36 mm Hg (S).   Recent Labs: 11/08/2015: ALT 20 11/23/2015: Brain Natriuretic Peptide 331.2; BUN 21; Creat 0.89; Potassium 3.3; Sodium 128 11/24/2015: HGB 11.4; Platelets 196    Lipid Panel    Component Value Date/Time   CHOL 153 11/08/2015 0959   TRIG 139 11/08/2015 0959   HDL 59 11/08/2015 0959   CHOLHDL 2.5 12/26/2014 0356   VLDL 19 12/26/2014 0356   LDLCALC 66 11/08/2015 0959   LDLDIRECT 93.7 04/10/2011 1000      Wt Readings  from Last 3 Encounters:  11/23/15 121 lb 12.8 oz (55.2 kg)  11/07/15 123 lb (55.8 kg)  10/31/15 124 lb 12.8 oz (56.6 kg)      ASSESSMENT AND PLAN:   # Chronic diastolic heart failure:  Deborah [name] is doing much better after starting lasix.  We will check a BMP today.  Echo is pending.  Continue amlodipine, carvedilol, valsartan and lasix.  # Hypertension: Deborah [name] blood pressure is better-controlled.  Continue current regimen. She did not tolerate the hydralazine that was started at the last visit.  # Atrial fibrillation: Currently in sinus rhythm.  Continue carvedilol. Continue anticoagulation with warfarin, INR goal 2-2.5 due to GI bleeding.    This patients CHA2DS2-VASc Score and unadjusted Ischemic Stroke Rate (% per year) is equal to 4.8 % stroke rate/year from a score of 4  Above score calculated as 1 point each if present [CHF, HTN, DM, Vascular=MI/PAD/Aortic Plaque, Age if 65-74, or Female] Above score calculated as 2 points each if present [Age > 75, or Stroke/TIA/TE]   # s/p mechanical AVR: Stable with moderately increased gradients.  INR goal 2-2.5 due to GI bleeding requiring transfusions.  Echo pending as above.  # Hyperlipidemia: We will check fasting lipids and a CMP.  She will continue simvastatin.  Current medicines are reviewed at length with the patient today.  The patient does not have concerns regarding medicines.  The following changes have been made:  None  Labs/ tests ordered today include:   No orders of the defined types were placed in this encounter.    Disposition:   FU with Jami Ohlin C. Oval Linsey, MD, [facility] in 1 month   This note was written with the assistance of speech recognition software.  Please excuse any transcriptional errors.  Signed, Koi Yarbro C. Oval Linsey, MD, [facility]  11/28/2015 7:55 AM    [place]

## 2015-11-28 NOTE — Patient Instructions (Addendum)
Medication Instructions:  Your physician recommends that you continue on your current medications as directed. Please refer to the Current Medication list given to you today.  Labwork: BMET at [facility] lab on the first floor  Testing/Procedures: Your physician has requested that you have an echocardiogram. Echocardiography is a painless test that uses sound waves to create images of your heart. It provides your doctor with information about the size and shape of your heart and how well your heart's chambers and valves are working. This procedure takes approximately one hour. There are no restrictions for this procedure.  Follow-Up: Your physician recommends that you schedule a follow-up appointment in: 1 month ov  If you need a refill on your cardiac medications before your next appointment, please call your pharmacy.

## 2015-11-28 NOTE — Telephone Encounter (Signed)
Per lab review - no change requested by MD per INR.  Pt will return in 2 weeks for lab.  Written instructions given to pt.  Appointment scheduled by this RN

## 2015-11-29 ENCOUNTER — Encounter: Payer: Self-pay | Admitting: Cardiovascular Disease

## 2015-11-30 ENCOUNTER — Telehealth: Payer: Self-pay | Admitting: *Deleted

## 2015-11-30 DIAGNOSIS — E876 Hypokalemia: Secondary | ICD-10-CM

## 2015-11-30 DIAGNOSIS — E875 Hyperkalemia: Secondary | ICD-10-CM

## 2015-11-30 LAB — BASIC METABOLIC PANEL
BUN / CREAT RATIO: 27 (ref 12–28)
BUN: 25 mg/dL (ref 8–27)
CALCIUM: 10 mg/dL (ref 8.7–10.3)
CO2: 26 mmol/L (ref 18–29)
CREATININE: 0.91 mg/dL (ref 0.57–1.00)
Chloride: 89 mmol/L — ABNORMAL LOW (ref 96–106)
GFR calc non Af Amer: 57 mL/min/{1.73_m2} — ABNORMAL LOW (ref >59)
GFR, EST AFRICAN AMERICAN: 66 mL/min/{1.73_m2} (ref >59)
Glucose: 92 mg/dL (ref 65–99)
Potassium: 5.5 mmol/L — ABNORMAL HIGH (ref 3.5–5.2)
Sodium: 129 mmol/L — ABNORMAL LOW (ref 134–144)

## 2015-11-30 NOTE — Telephone Encounter (Signed)
Labs reviewed labs and elevated K+ with Claiborne Billings D K+ 5.5 but per report could be falsely elevated.  Will decrease K+ to 20 meq daily and recheck in 1 week. Advised patient

## 2015-12-05 ENCOUNTER — Other Ambulatory Visit: Payer: Medicare Other

## 2015-12-06 ENCOUNTER — Telehealth: Payer: Self-pay | Admitting: Cardiovascular Disease

## 2015-12-06 DIAGNOSIS — Z7901 Long term (current) use of anticoagulants: Secondary | ICD-10-CM

## 2015-12-06 DIAGNOSIS — Z79899 Other long term (current) drug therapy: Secondary | ICD-10-CM

## 2015-12-06 NOTE — Telephone Encounter (Signed)
New message   Pt verbalized that she is calling for lab results

## 2015-12-06 NOTE — Telephone Encounter (Signed)
Line busy when dialed. 

## 2015-12-06 NOTE — Telephone Encounter (Signed)
Pt advised. This was related to a message she just checked from a week ago. Notes she already received instruction on meds from [place]. No further questions at this time, and she will go tomorrow for the BMET and INR rechecks.

## 2015-12-06 NOTE — Telephone Encounter (Signed)
New message       Pt is coming in tomorrow for a lab----she want to know if we will also check her pt/inr?  Please advise

## 2015-12-06 NOTE — Telephone Encounter (Signed)
Melinda advised OK to order, PT INR order placed for [facility], patient aware.

## 2015-12-06 NOTE — Telephone Encounter (Signed)
Pt requesting order for PT/INR. She has next week appt for lab draw but wants to see if this can be done early.  Informed patient I could enter order for the lab.   Will check w/ Dr. Oval Linsey for [place].

## 2015-12-07 ENCOUNTER — Other Ambulatory Visit: Payer: Self-pay | Admitting: Oncology

## 2015-12-08 ENCOUNTER — Telehealth: Payer: Self-pay | Admitting: Pharmacist

## 2015-12-08 LAB — BASIC METABOLIC PANEL
BUN: 19 mg/dL (ref 7–25)
CHLORIDE: 97 mmol/L — AB (ref 98–110)
CO2: 25 mmol/L (ref 20–31)
CREATININE: 0.93 mg/dL — AB (ref 0.60–0.88)
Calcium: 10.2 mg/dL (ref 8.6–10.4)
GLUCOSE: 87 mg/dL (ref 65–99)
Potassium: 4.8 mmol/L (ref 3.5–5.3)
Sodium: 135 mmol/L (ref 135–146)

## 2015-12-08 LAB — PROTIME-INR
INR: 2.8 — ABNORMAL HIGH
Prothrombin Time: 28.2 s — ABNORMAL HIGH (ref 9.0–11.5)

## 2015-12-08 NOTE — Telephone Encounter (Signed)
Pt calls with high blood pressure as below:  167/91 - has headache now  179/94 when she woke up - with pounding headache.   Will have her take an extra lasix today as she cannot titrate her other BP medications due to max doses or side effects. She will take an additional tablet tomorrow if similar symptoms. If similar symptoms persist to Sunday she was instructed to go to urgent care to be evaluated or call on-call provider for our office.

## 2015-12-12 ENCOUNTER — Other Ambulatory Visit: Payer: Self-pay

## 2015-12-12 ENCOUNTER — Ambulatory Visit (HOSPITAL_COMMUNITY): Payer: Medicare Other | Attending: Cardiology

## 2015-12-12 ENCOUNTER — Telehealth: Payer: Self-pay | Admitting: Cardiovascular Disease

## 2015-12-12 DIAGNOSIS — I351 Nonrheumatic aortic (valve) insufficiency: Secondary | ICD-10-CM | POA: Diagnosis not present

## 2015-12-12 DIAGNOSIS — I071 Rheumatic tricuspid insufficiency: Secondary | ICD-10-CM | POA: Insufficient documentation

## 2015-12-12 DIAGNOSIS — I4891 Unspecified atrial fibrillation: Secondary | ICD-10-CM | POA: Diagnosis not present

## 2015-12-12 DIAGNOSIS — E785 Hyperlipidemia, unspecified: Secondary | ICD-10-CM | POA: Diagnosis not present

## 2015-12-12 DIAGNOSIS — I340 Nonrheumatic mitral (valve) insufficiency: Secondary | ICD-10-CM | POA: Diagnosis not present

## 2015-12-12 DIAGNOSIS — I119 Hypertensive heart disease without heart failure: Secondary | ICD-10-CM | POA: Insufficient documentation

## 2015-12-12 DIAGNOSIS — I272 Other secondary pulmonary hypertension: Secondary | ICD-10-CM | POA: Insufficient documentation

## 2015-12-12 DIAGNOSIS — I472 Ventricular tachycardia: Secondary | ICD-10-CM | POA: Insufficient documentation

## 2015-12-12 DIAGNOSIS — Z8673 Personal history of transient ischemic attack (TIA), and cerebral infarction without residual deficits: Secondary | ICD-10-CM | POA: Diagnosis not present

## 2015-12-12 DIAGNOSIS — Z952 Presence of prosthetic heart valve: Secondary | ICD-10-CM | POA: Insufficient documentation

## 2015-12-12 NOTE — Telephone Encounter (Signed)
Returned patient call-made aware of results:   Notes Recorded by Skeet Latch, MD on 12/10/2015 at 10:35 PM EDT Kidney function and electrolytes are stable. Continue the lower dose of potassium (52mq).  Pt verbalized understanding.

## 2015-12-12 NOTE — Telephone Encounter (Signed)
New message ° ° ° °Pt verbalized that she is returning call for rn  °

## 2015-12-13 ENCOUNTER — Other Ambulatory Visit (HOSPITAL_BASED_OUTPATIENT_CLINIC_OR_DEPARTMENT_OTHER): Payer: Medicare Other

## 2015-12-13 ENCOUNTER — Telehealth: Payer: Self-pay

## 2015-12-13 ENCOUNTER — Telehealth: Payer: Self-pay | Admitting: Pharmacist

## 2015-12-13 DIAGNOSIS — K921 Melena: Secondary | ICD-10-CM

## 2015-12-13 DIAGNOSIS — D62 Acute posthemorrhagic anemia: Secondary | ICD-10-CM

## 2015-12-13 DIAGNOSIS — K922 Gastrointestinal hemorrhage, unspecified: Secondary | ICD-10-CM

## 2015-12-13 DIAGNOSIS — I4819 Other persistent atrial fibrillation: Secondary | ICD-10-CM

## 2015-12-13 DIAGNOSIS — D500 Iron deficiency anemia secondary to blood loss (chronic): Secondary | ICD-10-CM

## 2015-12-13 DIAGNOSIS — I482 Chronic atrial fibrillation: Secondary | ICD-10-CM | POA: Diagnosis not present

## 2015-12-13 DIAGNOSIS — I119 Hypertensive heart disease without heart failure: Secondary | ICD-10-CM

## 2015-12-13 DIAGNOSIS — D689 Coagulation defect, unspecified: Secondary | ICD-10-CM

## 2015-12-13 LAB — CBC WITH DIFFERENTIAL/PLATELET
BASO%: 0.6 % (ref 0.0–2.0)
BASOS ABS: 0 10*3/uL (ref 0.0–0.1)
EOS ABS: 0.1 10*3/uL (ref 0.0–0.5)
EOS%: 1.3 % (ref 0.0–7.0)
HCT: 38.2 % (ref 34.8–46.6)
HEMOGLOBIN: 12.4 g/dL (ref 11.6–15.9)
LYMPH%: 10.5 % — ABNORMAL LOW (ref 14.0–49.7)
MCH: 32.5 pg (ref 25.1–34.0)
MCHC: 32.5 g/dL (ref 31.5–36.0)
MCV: 100 fL (ref 79.5–101.0)
MONO#: 0.3 10*3/uL (ref 0.1–0.9)
MONO%: 6.6 % (ref 0.0–14.0)
NEUT%: 81 % — ABNORMAL HIGH (ref 38.4–76.8)
NEUTROS ABS: 3.8 10*3/uL (ref 1.5–6.5)
PLATELETS: 171 10*3/uL (ref 145–400)
RBC: 3.82 10*6/uL (ref 3.70–5.45)
RDW: 17.8 % — AB (ref 11.2–14.5)
WBC: 4.7 10*3/uL (ref 3.9–10.3)
lymph#: 0.5 10*3/uL — ABNORMAL LOW (ref 0.9–3.3)

## 2015-12-13 LAB — PROTIME-INR
INR: 1.7 — ABNORMAL LOW (ref 2.00–3.50)
PROTIME: 20.4 s — AB (ref 10.6–13.4)

## 2015-12-13 LAB — FERRITIN: Ferritin: 291 ng/ml — ABNORMAL HIGH (ref 9–269)

## 2015-12-13 NOTE — Telephone Encounter (Signed)
Pt called because pressures have been elevated and she does not feel well.  Today her pressures have been 170s/90s - at clinic today it was 165/72.  She states she feels terrible.  We have tried several different agents in her all of which she has been intolerant to due to side effect or electrolyte imbalances (hydralazine, HCTZ, Spironolactone, higher doses of amlodipine). We have not increased her beta blocker due to heart rate.   Will route message to Dr. Oval Linsey for recommendation.

## 2015-12-13 NOTE — Telephone Encounter (Signed)
INR 1.7 Coumadin 2.5 mg 4 days/week and 5 mg 3 days/week Per Dr Jana Hakim no changed and recheck in 1 week. Pt aware. Pr requested BP 165/72 HR 50.

## 2015-12-13 NOTE — Telephone Encounter (Signed)
Spoke to pt gave recommendation for clonidine 0.'1mg'$  BID - she states she still has a few from when she was on this in the past. The medication was discontinued a while back because Dr. Oval Linsey preferred not to use this medication if at all possible.   She will start with her previous supply which is still in date. Clonidine 0.'1mg'$  BID. She states she recalls having dry mouth on medication previously -recommended biotene.   She states appreciation and will call with any issues.

## 2015-12-13 NOTE — Telephone Encounter (Signed)
Lets try clonidine 0.1 mg bid

## 2015-12-16 ENCOUNTER — Telehealth: Payer: Self-pay | Admitting: Physician Assistant

## 2015-12-16 NOTE — Telephone Encounter (Signed)
MALASHA KLEPPE is a 82 y.o. female whose RN at [facility] called in with high BPs. BP 178/98.  Patient has a mild HA and slight dyspnea. No chest pain or neuro deficits. She took Clonidine 0.1 mg x 2 this afternoon.  Last dose was right before they called me. I advised her to wait 1 hour and recheck her BP. If still > 580 systolic, take Norvasc 5 mg then instead of waiting until later this PM. If her BP remains high with assoc HA, rec she go to the ED. Richardson Dopp, PA-C   12/16/2015 4:27 PM

## 2015-12-18 ENCOUNTER — Telehealth: Payer: Self-pay | Admitting: Pharmacist Clinician (PhC)/ Clinical Pharmacy Specialist

## 2015-12-18 MED ORDER — CHLORTHALIDONE 25 MG PO TABS: 25.0000 mg | ORAL_TABLET | Freq: Every day | ORAL | 3 refills | Status: DC

## 2015-12-18 NOTE — Telephone Encounter (Signed)
Please see notes.  Please call patient to FU on blood pressure. Richardson Dopp, PA-C   12/18/2015 10:52 PM

## 2015-12-18 NOTE — Telephone Encounter (Signed)
Patient called to report no improvement in BP.  States it woke her up in middle of the night - head throbbing, BP was 165/101.   Took her am dose of clonidine at 4 am.  BP has dropped since then.  Reports that her AM readings are > 160 for the most part, but by noon has been as low as 120, with it back up to 160's by 6pm.  She is quite stressed by this, as she knows that Dr. Oval Linsey does not like to use clonidine.  Unfortunately she is on maximum dose of ARB, cannot tolerate higher doses of beta blockers (low HR) or amlodpine (swelling).  Hydralazine made her quite nauseated and she will not re-challenge.   Our options are somewhat limited.  We could try aliskiren, as she is not diabetic, or chlorthalidone.    Will have her try chlorthalidone 25 mg once daily.  I advised her that she can take an extra dose of clonidine today in mid-afternoon, as the 4 am dose will probably wear off soon.  She was quite hesitant to do this, despite my reassurances.  She is to call back in 1 week, and she was also advised that it may take a few days for the chlorthalidone to kick in.  Will have her get a BMET when she calls in next week.

## 2015-12-19 NOTE — Telephone Encounter (Signed)
I s/w pt tonight in reference to her BP readings. Pt states BP was 156/80 today. Pt states no HA today. She is trying to give the new medication chlorthalidone a chance to help her BP. She says this is an ongoing battle. She does c/o some sob, which she feels is coming from her stomach being bloated right now due to her stomach issue which has been ongoing for years as well. I advised pt to keep her appt 10/3 with Dr.[place]. Advised if her BP is consistently above 160 SBP please call the office for further advice. Pt agreeable to plan of care. Pt said the RN at [facility] checks her BP for her. Pt states she is watching her salt intake. I advised pt that I will d/w Brynda Rim. PA as per our conversation today to see if he has any further recommendations. Pt said thank you for my time calling her and checking on her.

## 2015-12-20 ENCOUNTER — Telehealth: Payer: Self-pay | Admitting: *Deleted

## 2015-12-20 ENCOUNTER — Other Ambulatory Visit (HOSPITAL_BASED_OUTPATIENT_CLINIC_OR_DEPARTMENT_OTHER): Payer: Medicare Other

## 2015-12-20 ENCOUNTER — Telehealth: Payer: Self-pay | Admitting: Cardiovascular Disease

## 2015-12-20 DIAGNOSIS — D62 Acute posthemorrhagic anemia: Secondary | ICD-10-CM

## 2015-12-20 DIAGNOSIS — D689 Coagulation defect, unspecified: Secondary | ICD-10-CM

## 2015-12-20 DIAGNOSIS — I4819 Other persistent atrial fibrillation: Secondary | ICD-10-CM

## 2015-12-20 DIAGNOSIS — I119 Hypertensive heart disease without heart failure: Secondary | ICD-10-CM

## 2015-12-20 DIAGNOSIS — K921 Melena: Secondary | ICD-10-CM

## 2015-12-20 DIAGNOSIS — I482 Chronic atrial fibrillation: Secondary | ICD-10-CM | POA: Diagnosis not present

## 2015-12-20 DIAGNOSIS — D500 Iron deficiency anemia secondary to blood loss (chronic): Secondary | ICD-10-CM

## 2015-12-20 DIAGNOSIS — K922 Gastrointestinal hemorrhage, unspecified: Secondary | ICD-10-CM

## 2015-12-20 LAB — CBC WITH DIFFERENTIAL/PLATELET
BASO%: 0.4 % (ref 0.0–2.0)
BASOS ABS: 0 10*3/uL (ref 0.0–0.1)
EOS ABS: 0 10*3/uL (ref 0.0–0.5)
EOS%: 0.9 % (ref 0.0–7.0)
HEMATOCRIT: 34 % — AB (ref 34.8–46.6)
HEMOGLOBIN: 11.3 g/dL — AB (ref 11.6–15.9)
LYMPH#: 0.4 10*3/uL — AB (ref 0.9–3.3)
LYMPH%: 9.2 % — ABNORMAL LOW (ref 14.0–49.7)
MCH: 32.7 pg (ref 25.1–34.0)
MCHC: 33.2 g/dL (ref 31.5–36.0)
MCV: 98.3 fL (ref 79.5–101.0)
MONO#: 0.3 10*3/uL (ref 0.1–0.9)
MONO%: 7.1 % (ref 0.0–14.0)
NEUT%: 82.4 % — ABNORMAL HIGH (ref 38.4–76.8)
NEUTROS ABS: 3.8 10*3/uL (ref 1.5–6.5)
Platelets: 162 10*3/uL (ref 145–400)
RBC: 3.46 10*6/uL — ABNORMAL LOW (ref 3.70–5.45)
RDW: 15.7 % — AB (ref 11.2–14.5)
WBC: 4.7 10*3/uL (ref 3.9–10.3)

## 2015-12-20 LAB — COMPREHENSIVE METABOLIC PANEL
ALT: 21 U/L (ref 0–55)
ANION GAP: 8 meq/L (ref 3–11)
AST: 18 U/L (ref 5–34)
Albumin: 3.5 g/dL (ref 3.5–5.0)
Alkaline Phosphatase: 59 U/L (ref 40–150)
BILIRUBIN TOTAL: 0.42 mg/dL (ref 0.20–1.20)
BUN: 23.6 mg/dL (ref 7.0–26.0)
CALCIUM: 10.1 mg/dL (ref 8.4–10.4)
CHLORIDE: 94 meq/L — AB (ref 98–109)
CO2: 30 meq/L — AB (ref 22–29)
Creatinine: 0.9 mg/dL (ref 0.6–1.1)
EGFR: 62 mL/min/{1.73_m2} — AB (ref >90)
Glucose: 115 mg/dl (ref 70–140)
Potassium: 3.9 mEq/L (ref 3.5–5.1)
Sodium: 132 mEq/L — ABNORMAL LOW (ref 136–145)
Total Protein: 6.3 g/dL — ABNORMAL LOW (ref 6.4–8.3)

## 2015-12-20 LAB — PROTIME-INR
INR: 2.7 (ref 2.00–3.50)
PROTIME: 32.4 s — AB (ref 10.6–13.4)

## 2015-12-20 LAB — FERRITIN: FERRITIN: 228 ng/mL (ref 9–269)

## 2015-12-20 NOTE — Telephone Encounter (Signed)
INR 2.7  Current coumadin dose is 5 mg alt 2.5 mg ( 4 doses of 5 mg and 3 doses of 2.'5mg'$  ).  Pt states one episode today of dark stool.  Heme 11.7 ( decreased from last week ) and ferritin 228.  Per discussion- Deborah [name] will decrease coumadin by lower one dose of 5 mg to 2.5 mg.  She will return in one week for recheck.  Note Deborah [name] overall " just do not feel good " which per inquiry she states ongoing issues with BP and recent changes in BP medications " which they tell me will take a few days to work and my body adjust to"  This RN scheduled lab for 9/27 at 10 am.  No other needs at this time.

## 2015-12-20 NOTE — Telephone Encounter (Signed)
Spoke to daughter, Deborah [name]. Per instructions from Monday pt to start on Chlorthalidone and given option to take clonidine if needed for BP elevations. Deborah [name] notes concern that pt continuing to have problems with the BP, but also hasn't slept in 3 nights, doesn't feel well. I explained that the medications may take a week for discernable improvement. Patient is trying to get feeling better so that she can go to her granddaughter's wedding in 1 week, which is out of town.  Mary asked that Erasmo Downer or [place] call back - aware I will defer for further advice.  Deborah [name] may also be reached on her cell phone at  [phone]

## 2015-12-20 NOTE — Telephone Encounter (Signed)
Spoke with patient and she continues to have issues with blood pressure Patient c/o shortness of breath, bloating feeling in her abdomen, dull headache,and this morning dark stools. Dark stools she does have periodically but is followed by GI and Dr Jana Hakim  Stated she feels secondary to the Chlorthalidone she was recently started on and would like to stop. Feels like she did when she could not tolerate another blood pressure medication Ok to stop per Dr Oval Linsey and use extra clonidine 0.1 mg for systolic blood pressure about 160.  Advised patient and she will call back Friday 9/22 with update

## 2015-12-20 NOTE — Telephone Encounter (Signed)
Left message to call back  

## 2015-12-20 NOTE — Telephone Encounter (Signed)
New message   Pts dtr calling concerning the pt. She states that the pt is not feeling well and her bp continues to be very high. Please call.   Pt c/o BP issue: STAT if pt c/o blurred vision, one-sided weakness or slurred speech  1. What are your last 5 BP readings? Dtr does not have them  2. Are you having any other symptoms (ex. Dizziness, headache, blurred vision, passed out)? SOB and upset stomacy 3. What is your BP issue? It's been very high since the wknd

## 2015-12-21 NOTE — Telephone Encounter (Signed)
[place] with current treatment plan. Richardson Dopp, PA-C   12/21/2015 8:34 AM

## 2015-12-22 NOTE — Telephone Encounter (Signed)
Spoke with patient this am an she woke up with headache at 5:00 am 165/92 so she took a Clonidine 0.1 mg  At 9:00 am took remaining medications and after about an hour and a half blood pressure down to 106/46, asymptomatic Patient is feeling better since stopping Chlorthalidone  She is concerned with the increasing in blood pressure later in the day and during the night Discussed with Dr Oval Linsey and she is recommending taking the Clonidine 0.1 mg twice a day on a regular basis to only hold if systolic blood pressure below 120 Advised patient, verbalized understanding

## 2015-12-25 ENCOUNTER — Other Ambulatory Visit: Payer: Self-pay | Admitting: Gastroenterology

## 2015-12-25 DIAGNOSIS — R1084 Generalized abdominal pain: Secondary | ICD-10-CM

## 2015-12-25 DIAGNOSIS — R140 Abdominal distension (gaseous): Secondary | ICD-10-CM

## 2015-12-25 DIAGNOSIS — K5521 Angiodysplasia of colon with hemorrhage: Secondary | ICD-10-CM

## 2015-12-27 ENCOUNTER — Telehealth: Payer: Self-pay | Admitting: *Deleted

## 2015-12-27 ENCOUNTER — Other Ambulatory Visit: Payer: Self-pay

## 2015-12-27 ENCOUNTER — Ambulatory Visit (HOSPITAL_COMMUNITY)
Admission: RE | Admit: 2015-12-27 | Discharge: 2015-12-27 | Disposition: A | Discharge status: Home / Self Care | Payer: Medicare Other | Source: Ambulatory Visit | Attending: Oncology | Admitting: Oncology

## 2015-12-27 ENCOUNTER — Other Ambulatory Visit: Payer: Self-pay | Admitting: *Deleted

## 2015-12-27 ENCOUNTER — Emergency Department (HOSPITAL_COMMUNITY)
Admission: EM | Admit: 2015-12-27 | Discharge: 2015-12-27 | Disposition: A | Discharge status: Home / Self Care | Payer: Medicare Other | Attending: Emergency Medicine | Admitting: Emergency Medicine

## 2015-12-27 ENCOUNTER — Other Ambulatory Visit (HOSPITAL_BASED_OUTPATIENT_CLINIC_OR_DEPARTMENT_OTHER): Payer: Medicare Other

## 2015-12-27 DIAGNOSIS — R5381 Other malaise: Secondary | ICD-10-CM

## 2015-12-27 DIAGNOSIS — I482 Chronic atrial fibrillation: Secondary | ICD-10-CM | POA: Diagnosis not present

## 2015-12-27 DIAGNOSIS — R5383 Other fatigue: Principal | ICD-10-CM

## 2015-12-27 DIAGNOSIS — Z853 Personal history of malignant neoplasm of breast: Secondary | ICD-10-CM | POA: Insufficient documentation

## 2015-12-27 DIAGNOSIS — I509 Heart failure, unspecified: Secondary | ICD-10-CM | POA: Insufficient documentation

## 2015-12-27 DIAGNOSIS — Z87891 Personal history of nicotine dependence: Secondary | ICD-10-CM | POA: Insufficient documentation

## 2015-12-27 DIAGNOSIS — D62 Acute posthemorrhagic anemia: Secondary | ICD-10-CM

## 2015-12-27 DIAGNOSIS — I110 Hypertensive heart disease with heart failure: Secondary | ICD-10-CM | POA: Insufficient documentation

## 2015-12-27 DIAGNOSIS — R0602 Shortness of breath: Secondary | ICD-10-CM

## 2015-12-27 DIAGNOSIS — K921 Melena: Secondary | ICD-10-CM

## 2015-12-27 DIAGNOSIS — K922 Gastrointestinal hemorrhage, unspecified: Secondary | ICD-10-CM | POA: Diagnosis present

## 2015-12-27 DIAGNOSIS — I119 Hypertensive heart disease without heart failure: Secondary | ICD-10-CM

## 2015-12-27 DIAGNOSIS — Z79899 Other long term (current) drug therapy: Secondary | ICD-10-CM | POA: Insufficient documentation

## 2015-12-27 DIAGNOSIS — D500 Iron deficiency anemia secondary to blood loss (chronic): Secondary | ICD-10-CM

## 2015-12-27 DIAGNOSIS — I4819 Other persistent atrial fibrillation: Secondary | ICD-10-CM

## 2015-12-27 DIAGNOSIS — I4891 Unspecified atrial fibrillation: Secondary | ICD-10-CM | POA: Insufficient documentation

## 2015-12-27 DIAGNOSIS — D689 Coagulation defect, unspecified: Secondary | ICD-10-CM

## 2015-12-27 DIAGNOSIS — E039 Hypothyroidism, unspecified: Secondary | ICD-10-CM | POA: Diagnosis not present

## 2015-12-27 LAB — CBC WITH DIFFERENTIAL/PLATELET
BASO%: 0.2 % (ref 0.0–2.0)
BASOS ABS: 0 10*3/uL (ref 0.0–0.1)
EOS%: 0.7 % (ref 0.0–7.0)
Eosinophils Absolute: 0 10*3/uL (ref 0.0–0.5)
HCT: 30.3 % — ABNORMAL LOW (ref 34.8–46.6)
HEMOGLOBIN: 10.1 g/dL — AB (ref 11.6–15.9)
LYMPH%: 8.5 % — ABNORMAL LOW (ref 14.0–49.7)
MCH: 33.3 pg (ref 25.1–34.0)
MCHC: 33.3 g/dL (ref 31.5–36.0)
MCV: 100 fL (ref 79.5–101.0)
MONO#: 0.4 10*3/uL (ref 0.1–0.9)
MONO%: 7.2 % (ref 0.0–14.0)
NEUT#: 4.6 10*3/uL (ref 1.5–6.5)
NEUT%: 83.4 % — ABNORMAL HIGH (ref 38.4–76.8)
Platelets: 153 10*3/uL (ref 145–400)
RBC: 3.03 10*6/uL — AB (ref 3.70–5.45)
RDW: 16.6 % — AB (ref 11.2–14.5)
WBC: 5.5 10*3/uL (ref 3.9–10.3)
lymph#: 0.5 10*3/uL — ABNORMAL LOW (ref 0.9–3.3)

## 2015-12-27 LAB — BRAIN NATRIURETIC PEPTIDE: B Natriuretic Peptide: 279.2 pg/mL — ABNORMAL HIGH (ref 0.0–100.0)

## 2015-12-27 LAB — I-STAT TROPONIN, ED: TROPONIN I, POC: 0.02 ng/mL (ref 0.00–0.08)

## 2015-12-27 LAB — COMPREHENSIVE METABOLIC PANEL
ALT: 20 U/L (ref 0–55)
ANION GAP: 9 meq/L (ref 3–11)
AST: 18 U/L (ref 5–34)
Albumin: 3.4 g/dL — ABNORMAL LOW (ref 3.5–5.0)
Alkaline Phosphatase: 55 U/L (ref 40–150)
BILIRUBIN TOTAL: 0.32 mg/dL (ref 0.20–1.20)
BUN: 31.4 mg/dL — ABNORMAL HIGH (ref 7.0–26.0)
CALCIUM: 9.8 mg/dL (ref 8.4–10.4)
CO2: 26 meq/L (ref 22–29)
CREATININE: 0.9 mg/dL (ref 0.6–1.1)
Chloride: 97 mEq/L — ABNORMAL LOW (ref 98–109)
EGFR: 62 mL/min/{1.73_m2} — ABNORMAL LOW (ref >90)
Glucose: 107 mg/dl (ref 70–140)
Potassium: 4.4 mEq/L (ref 3.5–5.1)
Sodium: 133 mEq/L — ABNORMAL LOW (ref 136–145)
TOTAL PROTEIN: 5.9 g/dL — AB (ref 6.4–8.3)

## 2015-12-27 LAB — URINALYSIS, ROUTINE W REFLEX MICROSCOPIC
BILIRUBIN URINE: NEGATIVE
Glucose, UA: NEGATIVE mg/dL
HGB URINE DIPSTICK: NEGATIVE
KETONES UR: NEGATIVE mg/dL
Leukocytes, UA: NEGATIVE
NITRITE: NEGATIVE
PH: 6.5 (ref 5.0–8.0)
Protein, ur: NEGATIVE mg/dL
Specific Gravity, Urine: 1.007 (ref 1.005–1.030)

## 2015-12-27 LAB — PROTIME-INR
INR: 1.8 — ABNORMAL LOW (ref 2.00–3.50)
Protime: 21.6 Seconds — ABNORMAL HIGH (ref 10.6–13.4)

## 2015-12-27 LAB — SAMPLE TO BLOOD BANK

## 2015-12-27 LAB — FERRITIN: Ferritin: 165 ng/ml (ref 9–269)

## 2015-12-27 MED ORDER — FUROSEMIDE 10 MG/ML IJ SOLN
60.0000 mg | Freq: Once | INTRAMUSCULAR | Status: AC
  Administered 2015-12-27: 60 mg via INTRAVENOUS
  Filled 2015-12-27: qty 8

## 2015-12-27 MED ORDER — HEPARIN SOD (PORK) LOCK FLUSH 100 UNIT/ML IV SOLN
500.0000 [IU] | Freq: Once | INTRAVENOUS | Status: AC
  Administered 2015-12-27: 500 [IU]
  Filled 2015-12-27: qty 5

## 2015-12-27 MED ORDER — FUROSEMIDE 40 MG PO TABS: 40.0000 mg | ORAL_TABLET | Freq: Two times a day (BID) | ORAL | 0 refills | Status: DC

## 2015-12-27 NOTE — ED Triage Notes (Signed)
Pt from [place], for increased SOB and weakness. Sent for concern of CHF. Hx afib  O2 98% HR 85 BP 180/79

## 2015-12-27 NOTE — Telephone Encounter (Signed)
Deborah [name] came in today for routine lab evaluation with noted severe SOB- heme is 10.6 with noted decrease from last week.  Deborah [name] reports dark stools daily with INR today of 1.8.  Per MD pt sent for CXR for evaluation of SOB.  Post receiving results and per MD review this RN called report to [facility] ER Charge nurse and accompanied pt by wheelchair to ER.  Pt placed in bed in room 21 in ER and report given to ER nurse - Frederico Hamman.  This RN contacted pt's daughter [name] per cell and home number obtained VM - message left per above.

## 2015-12-27 NOTE — ED Provider Notes (Signed)
[facility] DEPT Provider Note   CSN: [id number] Arrival date & time: 12/27/15  1137     History   Chief Complaint Chief Complaint  Patient presents with  . Shortness of Breath  . Weakness    HPI Deborah [name] is a 82 y.o. female.  The history is provided by the patient.  Shortness of Breath  This is a new problem. The average episode lasts 10 days. The problem occurs rarely.Episode onset: 10 days ago. The problem has been gradually worsening. Associated symptoms include orthopnea. Pertinent negatives include no fever, no cough, no sputum production and no hemoptysis. It is unknown what precipitated the problem. She has tried nothing for the symptoms. Associated medical issues include heart failure.    Past Medical History:  Diagnosis Date  . Anemia   . Arrhythmia    Atrial Fibrillation  . Arthritis    "fingers, toes, hips; qwhere" (05/19/2013)  . Breast cancer ([place])    "right; S/P lumpectomy, chemo, XRT"   . Chronic lower GI bleeding   . Complication of anesthesia    "once I'm awakened, I don't sleep til the following night"  . GERD (gastroesophageal reflux disease)   . Gout   . Heart murmur   . High cholesterol   . History of blood transfusion 1996; ~ 2013   "related to valve replacement; GI bleeding"   . History of echocardiogram    Echo 1/17: mild LVH, EF 50-55%, no RWMA, mild AI, mechanical AVR with mean 21 mmHg/peak 42 mmHg (stable since 2012), mod MR, mod to severe BAE, PASP 36 mmHg  . History of nuclear stress test    Myoview 1/17: EF 59%, normal perfusion, low risk  . Hypertension   . Hypothyroidism   . Pneumonia 07/2011  . Rectus sheath hematoma 12/19/14    held anticoagulation for 1 week, transfused  . Stomach problems    seeing Dr Watt Climes  . TIA (transient ischemic attack) 12/24/14   symptoms resolved    Patient Active Problem List   Diagnosis Date Noted  . GIB (gastrointestinal bleeding) 12/27/2014  . Cerebral thrombosis with cerebral infarction  ([place]) 12/26/2014  . TIA (transient ischemic attack)   . Dysarthria 12/25/2014  . HLD (hyperlipidemia) 12/25/2014  . Essential hypertension 12/25/2014  . Gout 12/25/2014  . GERD (gastroesophageal reflux disease) 12/25/2014  . Absolute anemia   . Rectus sheath hematoma 12/19/2014  . Acute blood loss anemia 12/19/2014  . Hematoma 12/19/2014  . Pain 06/14/2014  . Poor venous access   . Melena 05/19/2013  . Coagulopathy ([place]) 01/27/2013  . Iron deficiency anemia due to chronic blood loss 07/24/2012  . Malaise and fatigue 05/21/2012  . Dyslipidemia 12/04/2011  . Atrial fibrillation ([place]) 09/06/2011  . Long term current use of anticoagulant therapy 08/08/2011  . Nonsustained ventricular tachycardia ([place]) 07/21/2011  . Hypokalemia 07/20/2011  . Hyponatremia 07/20/2011  . Bronchitis 07/20/2011  . S/P aortic valve replacement with metallic valve 09/38/1829  . Hypothyroidism   . Benign hypertensive heart disease without heart failure     Past Surgical History:  Procedure Laterality Date  . ABDOMINAL HYSTERECTOMY    . AORTIC VALVE REPLACEMENT  1996   [employer]  . APPENDECTOMY    . arteriogram  01/12   NO RAS   . BREAST BIOPSY Right   . BREAST LUMPECTOMY Right   . CARDIAC CATHETERIZATION    . CARDIOVERSION     X 2  . CATARACT EXTRACTION W/ INTRAOCULAR LENS  IMPLANT, BILATERAL Bilateral  bilateral cataract with lens implants  . CHOLECYSTECTOMY    . COLONOSCOPY WITH PROPOFOL N/A 11/24/2012   Procedure: COLONOSCOPY WITH PROPOFOL;  Surgeon: Jeryl Columbia, MD;  Location: [facility] ENDOSCOPY;  Service: Endoscopy;  Laterality: N/A;  . ESOPHAGOGASTRODUODENOSCOPY (EGD) WITH PROPOFOL N/A 07/25/2015   Procedure: ESOPHAGOGASTRODUODENOSCOPY (EGD) WITH PROPOFOL;  Surgeon: Clarene Essex, MD;  Location: [facility] ENDOSCOPY;  Service: Endoscopy;  Laterality: N/A;  . HOT HEMOSTASIS N/A 07/25/2015   Procedure: HOT HEMOSTASIS (ARGON PLASMA COAGULATION/BICAP);  Surgeon: Clarene Essex, MD;  Location: [facility] ENDOSCOPY;  Service:  Endoscopy;  Laterality: N/A;    OB History    No data available       Home Medications    Prior to Admission medications   Medication Sig Start Date End Date Taking? Authorizing Provider  acetaminophen (TYLENOL) 500 MG tablet Take 1,000 mg by mouth every 6 (six) hours as needed for mild pain, moderate pain or headache. Reported on 09/26/2015    Historical Provider, MD  allopurinol (ZYLOPRIM) 300 MG tablet Take 300 mg by mouth daily.    Historical Provider, MD  amLODipine (NORVASC) 5 MG tablet Take 1 tablet (5 mg total) by mouth daily. 10/17/15   Skeet Latch, MD  calcium-vitamin D 500 MG tablet Take 1 tablet by mouth 2 (two) times daily. Reported on 09/26/2015    Historical Provider, MD  carvedilol (COREG) 6.25 MG tablet Take 1 tablet (6.25 mg total) by mouth 2 (two) times daily. 09/26/15   Skeet Latch, MD  celecoxib (CELEBREX) 200 MG capsule Take 200 mg by mouth every other day.  12/07/14   Historical Provider, MD  cloNIDine (CATAPRES) 0.1 MG tablet Take 0.1 mg by mouth 2 (two) times daily.    Historical Provider, MD  dicyclomine (BENTYL) 10 MG capsule Take 10 mg by mouth daily as needed for spasms.     Historical Provider, MD  digoxin (LANOXIN) 0.125 MG tablet Take 0.125 mg by mouth daily.    Historical Provider, MD  fexofenadine (ALLEGRA ALLERGY) 180 MG tablet Take 180 mg by mouth daily as needed for allergies.     Historical Provider, MD  furosemide (LASIX) 40 MG tablet Take 1 tablet (40 mg total) by mouth daily. 11/23/15 02/21/16  Brittainy M Simmons, PA-C  hydrOXYzine (ATARAX/VISTARIL) 25 MG tablet Take 25 mg by mouth 3 (three) times daily as needed for anxiety.    Historical Provider, MD  levothyroxine (SYNTHROID, LEVOTHROID) 88 MCG tablet Take 1 tablet (88 mcg total) by mouth daily before breakfast. 10/10/15   Skeet Latch, MD  Multiple Vitamin (MULTIVITAMIN) tablet Take 1 tablet by mouth daily.    Historical Provider, MD  NITROSTAT 0.4 MG SL tablet Place 0.4 mg under the  tongue every 5 (five) minutes as needed for chest pain (x 3 doses). Reported on 10/17/2015 09/12/14   Historical Provider, MD  potassium chloride SA (K-DUR,KLOR-CON) 20 MEQ tablet Take 20 mEq by mouth daily.    Historical Provider, MD  Probiotic Product (PROBIOTIC ADVANCED PO) Take 1 tablet by mouth daily. Reported on 10/17/2015    Historical Provider, MD  simvastatin (ZOCOR) 20 MG tablet TAKE ONE-HALF TABLET DAILY 04/21/15   Darlin Coco, MD  valsartan (DIOVAN) 320 MG tablet Take 1 tablet (320 mg total) by mouth daily. 09/07/15   Skeet Latch, MD  warfarin (COUMADIN) 5 MG tablet TAKE ONE TABLET EACH DAY 12/08/15   [name], MD  zolpidem (AMBIEN CR) 12.5 MG CR tablet Take 12.5 mg by mouth at bedtime as needed for sleep.  Historical Provider, MD    Family History Family History  Problem Relation Age of Onset  . Dementia Mother   . Coronary artery disease Father   . Heart attack Father   . Breast cancer Daughter   . Colon cancer Sister   . Liver cancer Sister   . Lung cancer Brother   . COPD Brother     Social History Social History  Substance Use Topics  . Smoking status: Former Smoker    Packs/day: 1.00    Years: 20.00    Types: Cigarettes    Quit date: 04/01/1988  . Smokeless tobacco: Never Used  . Alcohol use 4.8 oz/week    4 Glasses of wine, 4 Shots of liquor per week     Comment: burbon or glass wine daily     Allergies   Amiodarone; Chlorthalidone; Alendronate sodium; Macrodantin [nitrofurantoin]; Meclizine; Norvasc [amlodipine besylate]; Hydralazine hcl; and Tussionex pennkinetic er [hydrocod polst-cpm polst er]   Review of Systems Review of Systems  Constitutional: Negative for fever.  Respiratory: Positive for shortness of breath. Negative for cough, hemoptysis and sputum production.   Cardiovascular: Positive for orthopnea.  All other systems reviewed and are negative.    Physical Exam Updated Vital Signs BP 180/84 (BP Location: Left Arm)   Pulse  64   Temp 97.8 F (36.6 C) (Oral)   Resp 23   Ht 5' (1.524 m)   Wt 118 lb (53.5 kg)   SpO2 96%   BMI 23.05 kg/m   Physical Exam  Constitutional: She is oriented to person, place, and time. She appears well-developed and well-nourished. No distress.  HENT:  Head: Normocephalic.  Eyes: Conjunctivae are normal.  Neck: Neck supple. No tracheal deviation present.  Cardiovascular: Normal rate.  An irregularly irregular rhythm present.  No murmur heard. Prominent mechanical S2  Pulmonary/Chest: Effort normal and breath sounds normal. No respiratory distress. She has no wheezes. She has no rales.  Abdominal: Soft. She exhibits no distension. There is no tenderness.  Neurological: She is alert and oriented to person, place, and time.  Skin: Skin is warm and dry. Capillary refill takes less than 2 seconds.  Psychiatric: She has a normal mood and affect. Her behavior is normal.     ED Treatments / Results  Labs (all labs ordered are listed, but only abnormal results are displayed) Labs Reviewed  BRAIN NATRIURETIC PEPTIDE - Abnormal; Notable for the following:       Result Value   B Natriuretic Peptide 279.2 (*)    All other components within normal limits  URINALYSIS, ROUTINE W REFLEX MICROSCOPIC (NOT AT [facility])  Randolm Idol, ED    EKG  EKG Interpretation  Date/Time:  Wednesday December 27 2015 11:57:13 EDT Ventricular Rate:  62 PR Interval:    QRS Duration: 98 QT Interval:  373 QTC Calculation: 379 R Axis:   72 Text Interpretation:  Atrial fibrillation Anterior infarct, age indeterminate Abnormal T, consider ischemia, diffuse leads Lateral leads are also involved No significant change since last tracing Confirmed by Kyal Arts MD, Meika Earll [phone]) on 12/27/2015 12:57:09 PM       Radiology Dg Chest 2 View  Result Date: 12/27/2015 CLINICAL DATA:  Maleate with fatigue and shortness of breath for 10 days. EXAM: CHEST  2 VIEW COMPARISON:  07/13/2015. FINDINGS: Trachea is  midline. Heart is enlarged. Thoracic aorta is calcified and somewhat prominent. Left IJ power port tip appears to be within a left-sided SVC. Mild basilar dependent interstitial prominence with possible peripheral septal  lines at the costophrenic angles. Tiny right pleural effusion. Surgical clips in the right axilla. IMPRESSION: Favor congestive heart failure. Electronically Signed   By: Lorin Picket M.D.   On: 12/27/2015 11:06    Procedures Procedures (including critical care time)  Medications Ordered in ED Medications  furosemide (LASIX) injection 60 mg (60 mg Intravenous Given 12/27/15 1226)  heparin lock flush 100 unit/mL (500 Units Intracatheter Given 12/27/15 1401)     Initial Impression / Assessment and Plan / ED Course  I have reviewed the triage vital signs and the nursing notes.  Pertinent labs & imaging results that were available during my care of the patient were reviewed by me and considered in my medical decision making (see chart for details).  Clinical Course    82 y.o. female presents with shortness of breath worsening over last 10 days. Has h/o AvR and CHF, on daily lasix therapy. Labs from outside concerning for slightly worsening anemia with chronic GIB but not rapid and Hb is 10. At this time it appears Pt is developing some interstitial edema with a small effusion that is likely 2/2 mild volume overload. No O2 requirement here, well appearing without increased WOB. In chronic a-fib without elevation of troponin or signs of ischemia. Diuresing well with IV lasix 60 mg. Recommended f/u with OP physician or in ED in 2 days for recheck of symptoms and vitals with repeat Hb while changing lasix to BID dosing for next few days to help improve respiratory status.. Return precautions discussed for worsening or new concerning symptoms.   Final Clinical Impressions(s) / ED Diagnoses   Final diagnoses:  Acute on chronic congestive heart failure, unspecified congestive heart  failure type [facility])    New Prescriptions Discharge Medication List as of 12/27/2015  1:39 PM       Leo Grosser, MD 12/27/15 1816

## 2015-12-27 NOTE — ED Notes (Signed)
MD at bedside. 

## 2015-12-28 ENCOUNTER — Ambulatory Visit (INDEPENDENT_AMBULATORY_CARE_PROVIDER_SITE_OTHER): Payer: Medicare Other | Admitting: Physician Assistant

## 2015-12-28 ENCOUNTER — Encounter: Payer: Self-pay | Admitting: Physician Assistant

## 2015-12-28 VITALS — BP 140/60 | HR 53 | Ht 60.0 in | Wt 120.0 lb

## 2015-12-28 DIAGNOSIS — I6330 Cerebral infarction due to thrombosis of unspecified cerebral artery: Secondary | ICD-10-CM

## 2015-12-28 DIAGNOSIS — I482 Chronic atrial fibrillation, unspecified: Secondary | ICD-10-CM

## 2015-12-28 DIAGNOSIS — Z7901 Long term (current) use of anticoagulants: Secondary | ICD-10-CM | POA: Diagnosis not present

## 2015-12-28 DIAGNOSIS — I5031 Acute diastolic (congestive) heart failure: Secondary | ICD-10-CM

## 2015-12-28 MED ORDER — FUROSEMIDE 40 MG PO TABS: ORAL_TABLET | ORAL | 6 refills | Status: DC

## 2015-12-28 MED ORDER — POTASSIUM CHLORIDE CRYS ER 20 MEQ PO TBCR: EXTENDED_RELEASE_TABLET | ORAL | 6 refills | Status: DC

## 2015-12-28 NOTE — Progress Notes (Signed)
Cardiology Office Note   Date:  12/28/2015   ID:  Deborah [name], DOB 02/04/1927, MRN [id number]  PCP:  Mathews Argyle, MD  Cardiologist:  Dr Oliver Barre, PA-C   No chief complaint on file.   History of Present Illness: Deborah [name] is a 82 y.o. female with a history of mech AVR, anemia w/ chronic GIB, chronic afib on coumadin, HTN, HLD, CVA/TIA, NSVT, hypothyroid.  Multiple phone messages 12/2015 re: electrolytes and BP issues, clonidine added.  09/27 ER visit after pt went to [facility] for INR>>SOB>>ER w/ IV Lasix 60 mg x 1 dose, diuresed and improved. Early office f/u recommended.  Deborah [name] presents for evaluation and management of volume overload.  She has scales at home but has not been weighing herself. She is to think that her "fighting weight" was 125, but she has not been that in some months.  Her problem is mostly in her abdomen, she is not constipated. She has noticed her stools be more dark or tarry than usual. Because of her chronic GI bleed, she monitors his carefully. The bleeding is not severe. She has a fullness in her stomach that makes it harder for her to breathe. She feels short of breath and has some orthopnea, but not PND. She has a poor appetite and has been eating very little because of the abdominal bloating and shortness of breath. She has not had lower extremity edema. She does have dyspnea on exertion.  Labs the emergency room yesterday showed an elevated BNP, BUN mildly elevated but a normal creatinine. Her anemia was slightly worse than usual.   Past Medical History:  Diagnosis Date  . Anemia   . Arthritis    "fingers, toes, hips; qwhere" (05/19/2013)  . Breast cancer ([place])    "right; S/P lumpectomy, chemo, XRT"   . Chronic lower GI bleeding   . Complication of anesthesia    "once I'm awakened, I don't sleep til the following night"  . GERD (gastroesophageal reflux disease)   . Gout   . Heart murmur   . High  cholesterol   . History of blood transfusion 1996; ~ 2013   "related to valve replacement; GI bleeding"   . History of echocardiogram    Echo 1/17: mild LVH, EF 50-55%, no RWMA, mild AI, mechanical AVR with mean 21 mmHg/peak 42 mmHg (stable since 2012), mod MR, mod to severe BAE, PASP 36 mmHg  . History of nuclear stress test    Myoview 1/17: EF 59%, normal perfusion, low risk  . Hypertension   . Hypothyroidism   . Permanent atrial fibrillation ([place])       . Pneumonia 07/2011  . Rectus sheath hematoma 12/19/14    held anticoagulation for 1 week, transfused  . Stomach problems    seeing Dr Watt Climes  . TIA (transient ischemic attack) 12/24/14   symptoms resolved    Past Surgical History:  Procedure Laterality Date  . ABDOMINAL HYSTERECTOMY    . AORTIC VALVE REPLACEMENT  1996   [employer]  . APPENDECTOMY    . arteriogram  01/12   NO RAS   . BREAST BIOPSY Right   . BREAST LUMPECTOMY Right   . CARDIAC CATHETERIZATION    . CARDIOVERSION     X 2  . CATARACT EXTRACTION W/ INTRAOCULAR LENS  IMPLANT, BILATERAL Bilateral    bilateral cataract with lens implants  . CHOLECYSTECTOMY    . COLONOSCOPY WITH PROPOFOL N/A 11/24/2012  Procedure: COLONOSCOPY WITH PROPOFOL;  Surgeon: Jeryl Columbia, MD;  Location: [facility] ENDOSCOPY;  Service: Endoscopy;  Laterality: N/A;  . ESOPHAGOGASTRODUODENOSCOPY (EGD) WITH PROPOFOL N/A 07/25/2015   Procedure: ESOPHAGOGASTRODUODENOSCOPY (EGD) WITH PROPOFOL;  Surgeon: Clarene Essex, MD;  Location: [facility] ENDOSCOPY;  Service: Endoscopy;  Laterality: N/A;  . HOT HEMOSTASIS N/A 07/25/2015   Procedure: HOT HEMOSTASIS (ARGON PLASMA COAGULATION/BICAP);  Surgeon: Clarene Essex, MD;  Location: [facility] ENDOSCOPY;  Service: Endoscopy;  Laterality: N/A;    Current Outpatient Prescriptions  Medication Sig Dispense Refill  . acetaminophen (TYLENOL) 500 MG tablet Take 1,000 mg by mouth every 6 (six) hours as needed for mild pain, moderate pain or headache. Reported on 09/26/2015    . allopurinol  (ZYLOPRIM) 300 MG tablet Take 300 mg by mouth at bedtime.     Marland Kitchen amLODipine (NORVASC) 5 MG tablet Take 1 tablet (5 mg total) by mouth daily. (Patient taking differently: Take 5 mg by mouth at bedtime. ) 180 tablet 3  . calcium-vitamin D 500 MG tablet Take 1 tablet by mouth 2 (two) times daily. Reported on 09/26/2015    . carvedilol (COREG) 6.25 MG tablet Take 1 tablet (6.25 mg total) by mouth 2 (two) times daily. 60 tablet 3  . celecoxib (CELEBREX) 200 MG capsule Take 200 mg by mouth every other day.   4  . cloNIDine (CATAPRES) 0.1 MG tablet Take 0.1 mg by mouth 2 (two) times daily.    Marland Kitchen dicyclomine (BENTYL) 10 MG capsule Take 10 mg by mouth daily as needed for spasms.     . digoxin (LANOXIN) 0.125 MG tablet Take 0.125 mg by mouth daily.    . fexofenadine (ALLEGRA ALLERGY) 180 MG tablet Take 180 mg by mouth daily.     . furosemide (LASIX) 40 MG tablet Take 1 tablet (40 mg total) by mouth 2 (two) times daily. Then resume normal daily dosing 6 tablet 0  . hydrOXYzine (ATARAX/VISTARIL) 25 MG tablet Take 25 mg by mouth 3 (three) times daily as needed for anxiety.    Marland Kitchen levothyroxine (SYNTHROID, LEVOTHROID) 88 MCG tablet Take 1 tablet (88 mcg total) by mouth daily before breakfast. 90 tablet 3  . NITROSTAT 0.4 MG SL tablet Place 0.4 mg under the tongue every 5 (five) minutes as needed for chest pain (x 3 doses). Reported on 10/17/2015  1  . potassium chloride SA (K-DUR,KLOR-CON) 20 MEQ tablet Take 10 mEq by mouth 2 (two) times daily.     . Probiotic Product (PROBIOTIC ADVANCED PO) Take 1 tablet by mouth daily. Reported on 10/17/2015    . simvastatin (ZOCOR) 20 MG tablet TAKE ONE-HALF TABLET DAILY (Patient taking differently: TAKE '10mg'$  or ONE-HALF TABLET evening) 30 tablet 3  . valsartan (DIOVAN) 320 MG tablet Take 1 tablet (320 mg total) by mouth daily. 30 tablet 5  . warfarin (COUMADIN) 5 MG tablet TAKE ONE TABLET EACH DAY (Patient taking differently: Take 2.'5mg'$ -'5mg'$  tablet daily as directed, currently pt is  take '5mg'$  on Saturday, Sunday, Tuesday and Thursdays, and 2.'5mg'$  on Monday, Wednesday, and Fridays) 90 tablet 4  . zolpidem (AMBIEN CR) 12.5 MG CR tablet Take 12.5 mg by mouth at bedtime as needed for sleep.     No current facility-administered medications for this visit.     Allergies:   Amiodarone; Chlorthalidone; Alendronate sodium; Macrodantin [nitrofurantoin]; Meclizine; Norvasc [amlodipine besylate]; Hydralazine hcl; and Tussionex pennkinetic er [hydrocod polst-cpm polst er]    Social History:  The patient  reports that she quit smoking about 27 years ago.  Her smoking use included Cigarettes. She has a 20.00 pack-year smoking history. She has never used smokeless tobacco. She reports that she drinks about 4.8 oz of alcohol per week . She reports that she does not use drugs.   Family History:  The patient's family history includes Breast cancer in her daughter; COPD in her brother; Colon cancer in her sister; Coronary artery disease in her father; Dementia in her mother; Heart attack in her father; Liver cancer in her sister; Lung cancer in her brother.    ROS:  Please see the history of present illness. All other systems are reviewed and negative.    PHYSICAL EXAM: VS:  BP 140/60   Pulse (!) 53   Ht 5' (1.524 m)   Wt 120 lb (54.4 kg)   BMI 23.44 kg/m  , BMI Body mass index is 23.44 kg/m. GEN: Well nourished, well developed, female in no acute distress  HEENT: normal for age  Neck: JVD 8 cm, +HJR, no carotid bruit, no masses Cardiac: Irreg R&R; 2/6 murmur, crisp valve click, no rubs, or gallops Respiratory:  Decreased breath sounds bases bilaterally, normal work of breathing GI: soft, nontender, nondistended, + BS MS: no deformity or atrophy; no edema; distal pulses are 2+ in all 4 extremities   Skin: warm and dry, no rash Neuro:  Strength and sensation are intact Psych: euthymic mood, full affect   EKG:  EKG is ordered today. ECG demonstrates atrial fibrillation with slow  ventricular response, heart rate 53, incomplete left bundle branch block, no change from May 2017  ECHO: 09/12 - Left ventricle: The cavity size was normal. Wall thickness was   increased in a pattern of moderate LVH. Systolic function was   normal. The estimated ejection fraction was in the range of 55%   to 60%. Wall motion was normal; there were no regional wall   motion abnormalities. Doppler parameters are consistent with high   ventricular filling pressure. - Aortic valve: A mechanical prosthesis was present. There was   trivial regurgitation. Valve area (VTI): 0.96 cm^2. Valve area   (Vmax): 0.85 cm^2. Valve area (Vmean): 0.8 cm^2. - Mitral valve: Calcified annulus. There was mild to moderate   regurgitation. - Left atrium: The atrium was severely dilated. - Right atrium: The atrium was mildly dilated. - Tricuspid valve: There was moderate regurgitation. - Pulmonary arteries: Systolic pressure was mildly to moderately   increased. PA peak pressure: 42 mm Hg (S). Impressions: - Normal LV systolic function; moderate LVH; elevated LV filling   pressure; severe LAE; mild RAE; mild RVE; prosthetic aortic valve   with mean gradient 18 mmHg and trace AI; mild to moderate MR;   moderate TR with mild to moderate pulmonary hypertension;   compared to 04/05/15, no significant change.  Recent Labs: 12/27/2015: ALT 20; B Natriuretic Peptide 279.2; BUN 31.4; Creatinine 0.9; HGB 10.1; Platelets 153; Potassium 4.4; Sodium 133    Lipid Panel    Component Value Date/Time   CHOL 153 11/08/2015 0959   TRIG 139 11/08/2015 0959   HDL 59 11/08/2015 0959   CHOLHDL 2.5 12/26/2014 0356   VLDL 19 12/26/2014 0356   LDLCALC 66 11/08/2015 0959   LDLDIRECT 93.7 04/10/2011 1000     Wt Readings from Last 3 Encounters:  12/28/15 120 lb (54.4 kg)  12/27/15 118 lb (53.5 kg)  11/28/15 121 lb 9.6 oz (55.2 kg)     Other studies Reviewed: Additional studies/ records that were reviewed today include:  Office notes, hospital  records and testing.  ASSESSMENT AND PLAN:  1.  Acute diastolic CHF: She has been eating poorly. Her weight is up 2 pounds from yesterday but previous weights were the same or higher. However, I believe she has lost some weight and pounds fluid. She is currently taking Lasix 40 mg twice a day, started yesterday. She is breathing somewhat [name] after the IV Lasix in the emergency room.  She is to start date the weights, and continue the Lasix 40 mg twice a day until Monday morning or until she loses 3 pounds. She is encouraged to eat a low-sodium diet. Once her dry weight is established, she can use the Lasix when necessary if her weight goes up. She is to take potassium tablet every time she takes Lasix.  She is to get labs at the cancer center next week and we will make sure she gets a BMET checked at that time.  2. Permanent atrial fibrillation: Her heart rate is low today and has been low in the past. Because her BUN and creatinine may elevate with diuresis, I will discontinue the digoxin. She may be less likely to put on fluid if her heart rate is low higher. Currently she is not having symptoms of presyncope and is not having palpitations.  3. Anticoagulation: CHA2DS2VASc=7 (age x 2, female, CVA x 2, HTN, CHF). Because of her mechanical aortic valve, her goal INR would normally be 2.5-3.5. However, because of her history of chronic GI bleeding, Dr. Jana Hakim has decreased her goal. He manages her INRs   Current medicines are reviewed at length with the patient today.  The patient has concerns regarding medicines.  The following changes have been made:  Discontinue digoxin, increase Lasix as instructed  Labs/ tests ordered today include:   Orders Placed This Encounter  Procedures  . Basic Metabolic Panel (BMET)  . EKG 12-Lead     Disposition:   FU with Dr. Oval Linsey or myself in a month  Signed, Lenoard Aden  12/28/2015 5:48 PM    [place] Phone: [phone]; Fax: [phone]  This note was written with the assistance of speech recognition software. Please excuse any transcriptional errors.

## 2015-12-28 NOTE — Patient Instructions (Addendum)
Medications:  Take Furosemide 40 mg twice daily with potassium until weight comes down 3 lbs or until Monday. Then take as needed for weight gain.  STOP digoxin.  Labwork:  Your physician recommends that have some labwork done--BMET. Keep Lab appointment for Monday.  Other Instructions:  Your physician recommends that you weigh, daily, at the same time every day, and in the same amount of clothing. Please record your daily weights on the handout provided and bring it to your next appointment.   Start a low sodium diet.   Low-Sodium Eating Plan Sodium raises blood pressure and causes water to be held in the body. Getting less sodium from food will help lower your blood pressure, reduce any swelling, and protect your heart, liver, and kidneys. We get sodium by adding salt (sodium chloride) to food. Most of our sodium comes from canned, boxed, and frozen foods. Restaurant foods, fast foods, and pizza are also very high in sodium. Even if you take medicine to lower your blood pressure or to reduce fluid in your body, getting less sodium from your food is important. WHAT IS MY PLAN? Most people should limit their sodium intake to 2,300 mg a day. Your health care provider recommends that you limit your sodium intake to __________ a day.  WHAT DO I NEED TO KNOW ABOUT THIS EATING PLAN? For the low-sodium eating plan, you will follow these general guidelines:  Choose foods with a % Daily Value for sodium of less than 5% (as listed on the food label).   Use salt-free seasonings or herbs instead of table salt or sea salt.   Check with your health care provider or pharmacist before using salt substitutes.   Eat fresh foods.  Eat more vegetables and fruits.  Limit canned vegetables. If you do use them, rinse them well to decrease the sodium.   Limit cheese to 1 oz (28 g) per day.   Eat lower-sodium products, often labeled as "lower sodium" or "no salt added."  Avoid foods that  contain monosodium glutamate (MSG). MSG is sometimes added to [place] food and some canned foods.  Check food labels (Nutrition Facts labels) on foods to learn how much sodium is in one serving.  Eat more home-cooked food and less restaurant, buffet, and fast food.  When eating at a restaurant, ask that your food be prepared with less salt, or no salt if possible.  HOW DO I READ FOOD LABELS FOR SODIUM INFORMATION? The Nutrition Facts label lists the amount of sodium in one serving of the food. If you eat more than one serving, you must multiply the listed amount of sodium by the number of servings. Food labels may also identify foods as:  Sodium free--Less than 5 mg in a serving.  Very low sodium--35 mg or less in a serving.  Low sodium--140 mg or less in a serving.  Light in sodium--50% less sodium in a serving. For example, if a food that usually has 300 mg of sodium is changed to become light in sodium, it will have 150 mg of sodium.  Reduced sodium--25% less sodium in a serving. For example, if a food that usually has 400 mg of sodium is changed to reduced sodium, it will have 300 mg of sodium. WHAT FOODS CAN I EAT? Grains Low-sodium cereals, including oats, puffed wheat and rice, and shredded wheat cereals. Low-sodium crackers. Unsalted rice and pasta. Lower-sodium bread.  Vegetables Frozen or fresh vegetables. Low-sodium or reduced-sodium canned vegetables. Low-sodium or reduced-sodium tomato  sauce and paste. Low-sodium or reduced-sodium tomato and vegetable juices.  Fruits Fresh, frozen, and canned fruit. Fruit juice.  Meat and Other Protein Products Low-sodium canned tuna and salmon. Fresh or frozen meat, poultry, seafood, and fish. Lamb. Unsalted nuts. Dried beans, peas, and lentils without added salt. Unsalted canned beans. Homemade soups without salt. Eggs.  Dairy Milk. Soy milk. Ricotta cheese. Low-sodium or reduced-sodium cheeses. Yogurt.  Condiments Fresh and  dried herbs and spices. Salt-free seasonings. Onion and garlic powders. Low-sodium varieties of mustard and ketchup. Fresh or refrigerated horseradish. Lemon juice.  Fats and Oils Reduced-sodium salad dressings. Unsalted butter.  Other Unsalted popcorn and pretzels.  The items listed above may not be a complete list of recommended foods or beverages. Contact your dietitian for more options. WHAT FOODS ARE NOT RECOMMENDED? Grains Instant hot cereals. Bread stuffing, pancake, and biscuit mixes. Croutons. Seasoned rice or pasta mixes. Noodle soup cups. Boxed or frozen macaroni and cheese. Self-rising flour. Regular salted crackers. Vegetables Regular canned vegetables. Regular canned tomato sauce and paste. Regular tomato and vegetable juices. Frozen vegetables in sauces. Salted [place] fries. Olives. Angie Fava. Relishes. Sauerkraut. Salsa. Meat and Other Protein Products Salted, canned, smoked, spiced, or pickled meats, seafood, or fish. Bacon, ham, sausage, hot dogs, corned beef, chipped beef, and packaged luncheon meats. Salt pork. Jerky. Pickled herring. Anchovies, regular canned tuna, and sardines. Salted nuts. Dairy Processed cheese and cheese spreads. Cheese curds. Blue cheese and cottage cheese. Buttermilk.  Condiments Onion and garlic salt, seasoned salt, table salt, and sea salt. Canned and packaged gravies. Worcestershire sauce. Tartar sauce. Barbecue sauce. Teriyaki sauce. Soy sauce, including reduced sodium. Steak sauce. Fish sauce. Oyster sauce. Cocktail sauce. Horseradish that you find on the shelf. Regular ketchup and mustard. Meat flavorings and tenderizers. Bouillon cubes. Hot sauce. Tabasco sauce. Marinades. Taco seasonings. Relishes. Fats and Oils Regular salad dressings. Salted butter. Margarine. Ghee. Bacon fat.  Other Potato and tortilla chips. Corn chips and puffs. Salted popcorn and pretzels. Canned or dried soups. Pizza. Frozen entrees and pot pies.  The items  listed above may not be a complete list of foods and beverages to avoid. Contact your dietitian for more information.   This information is not intended to replace advice given to you by your health care provider. Make sure you discuss any questions you have with your health care provider.   Document Released: 09/07/2001 Document Revised: 04/08/2014 Document Reviewed: 01/20/2013 Elsevier Interactive Patient Education 2016 [place]:  Your physician recommends that you schedule a follow-up appointment in: 1 month with Rosaria Ferries, PA-C or Dr. Oval Linsey.

## 2015-12-29 ENCOUNTER — Ambulatory Visit: Payer: Medicare Other | Admitting: Cardiovascular Disease

## 2015-12-29 ENCOUNTER — Telehealth: Payer: Self-pay | Admitting: *Deleted

## 2016-01-01 ENCOUNTER — Telehealth: Payer: Self-pay | Admitting: *Deleted

## 2016-01-01 ENCOUNTER — Other Ambulatory Visit (HOSPITAL_BASED_OUTPATIENT_CLINIC_OR_DEPARTMENT_OTHER): Payer: Medicare Other

## 2016-01-01 ENCOUNTER — Encounter: Payer: Self-pay | Admitting: Physician Assistant

## 2016-01-01 ENCOUNTER — Other Ambulatory Visit: Payer: Self-pay | Admitting: *Deleted

## 2016-01-01 ENCOUNTER — Ambulatory Visit (HOSPITAL_COMMUNITY)
Admission: RE | Admit: 2016-01-01 | Discharge: 2016-01-01 | Disposition: A | Discharge status: Home / Self Care | Payer: Medicare Other | Source: Ambulatory Visit | Attending: Oncology | Admitting: Oncology

## 2016-01-01 ENCOUNTER — Encounter: Payer: Self-pay | Admitting: Cardiovascular Disease

## 2016-01-01 DIAGNOSIS — D500 Iron deficiency anemia secondary to blood loss (chronic): Secondary | ICD-10-CM

## 2016-01-01 DIAGNOSIS — I119 Hypertensive heart disease without heart failure: Secondary | ICD-10-CM

## 2016-01-01 DIAGNOSIS — I4891 Unspecified atrial fibrillation: Secondary | ICD-10-CM

## 2016-01-01 DIAGNOSIS — K922 Gastrointestinal hemorrhage, unspecified: Secondary | ICD-10-CM

## 2016-01-01 DIAGNOSIS — D649 Anemia, unspecified: Secondary | ICD-10-CM | POA: Diagnosis not present

## 2016-01-01 DIAGNOSIS — K921 Melena: Secondary | ICD-10-CM

## 2016-01-01 DIAGNOSIS — R5383 Other fatigue: Principal | ICD-10-CM

## 2016-01-01 DIAGNOSIS — R5381 Other malaise: Secondary | ICD-10-CM

## 2016-01-01 DIAGNOSIS — D62 Acute posthemorrhagic anemia: Secondary | ICD-10-CM

## 2016-01-01 DIAGNOSIS — D689 Coagulation defect, unspecified: Secondary | ICD-10-CM

## 2016-01-01 DIAGNOSIS — I482 Chronic atrial fibrillation: Secondary | ICD-10-CM | POA: Diagnosis not present

## 2016-01-01 DIAGNOSIS — D528 Other folate deficiency anemias: Secondary | ICD-10-CM

## 2016-01-01 DIAGNOSIS — I4819 Other persistent atrial fibrillation: Secondary | ICD-10-CM

## 2016-01-01 LAB — CBC WITH DIFFERENTIAL/PLATELET
BASO%: 0.5 % (ref 0.0–2.0)
BASOS ABS: 0 10*3/uL (ref 0.0–0.1)
EOS ABS: 0.1 10*3/uL (ref 0.0–0.5)
EOS%: 1.7 % (ref 0.0–7.0)
HCT: 30.4 % — ABNORMAL LOW (ref 34.8–46.6)
HGB: 9.6 g/dL — ABNORMAL LOW (ref 11.6–15.9)
LYMPH%: 14.6 % (ref 14.0–49.7)
MCH: 32.9 pg (ref 25.1–34.0)
MCHC: 31.6 g/dL (ref 31.5–36.0)
MCV: 104.1 fL — AB (ref 79.5–101.0)
MONO#: 0.4 10*3/uL (ref 0.1–0.9)
MONO%: 8.7 % (ref 0.0–14.0)
NEUT#: 3 10*3/uL (ref 1.5–6.5)
NEUT%: 74.5 % (ref 38.4–76.8)
PLATELETS: 179 10*3/uL (ref 145–400)
RBC: 2.92 10*6/uL — AB (ref 3.70–5.45)
RDW: 16.9 % — ABNORMAL HIGH (ref 11.2–14.5)
WBC: 4 10*3/uL (ref 3.9–10.3)
lymph#: 0.6 10*3/uL — ABNORMAL LOW (ref 0.9–3.3)

## 2016-01-01 LAB — SAMPLE TO BLOOD BANK

## 2016-01-01 LAB — PROTIME-INR
INR: 2.5 (ref 2.00–3.50)
Protime: 30 Seconds — ABNORMAL HIGH (ref 10.6–13.4)

## 2016-01-01 LAB — FERRITIN: FERRITIN: 111 ng/mL (ref 9–269)

## 2016-01-01 NOTE — Telephone Encounter (Signed)
Per MD review of labs - and RN discussion with pt  - plan is to recheck labs on Wednesday 10/4 prior to planned trip for her grand daughters' wedding.  Of note pt states she " feels better " post ER visit with follow up with cardiologist where they increased her lasix dose. She lost 4 lbs and feels she can breath much better.  No changes in coumadin dose at this time.  Ferritin level 111 - with no need for IV iron at this time.

## 2016-01-02 ENCOUNTER — Ambulatory Visit (INDEPENDENT_AMBULATORY_CARE_PROVIDER_SITE_OTHER): Payer: Medicare Other | Admitting: Cardiovascular Disease

## 2016-01-02 ENCOUNTER — Encounter: Payer: Self-pay | Admitting: Cardiovascular Disease

## 2016-01-02 VITALS — BP 115/58 | HR 59 | Ht 60.0 in | Wt 118.2 lb

## 2016-01-02 DIAGNOSIS — I10 Essential (primary) hypertension: Secondary | ICD-10-CM | POA: Diagnosis not present

## 2016-01-02 DIAGNOSIS — E7800 Pure hypercholesterolemia, unspecified: Secondary | ICD-10-CM

## 2016-01-02 DIAGNOSIS — I6330 Cerebral infarction due to thrombosis of unspecified cerebral artery: Secondary | ICD-10-CM

## 2016-01-02 DIAGNOSIS — Z7901 Long term (current) use of anticoagulants: Secondary | ICD-10-CM

## 2016-01-02 DIAGNOSIS — Z952 Presence of prosthetic heart valve: Secondary | ICD-10-CM

## 2016-01-02 DIAGNOSIS — I480 Paroxysmal atrial fibrillation: Secondary | ICD-10-CM | POA: Diagnosis not present

## 2016-01-02 DIAGNOSIS — I5031 Acute diastolic (congestive) heart failure: Secondary | ICD-10-CM

## 2016-01-02 MED ORDER — CLONIDINE HCL 0.2 MG/24HR TD PTWK: 0.2000 mg | MEDICATED_PATCH | TRANSDERMAL | 12 refills | Status: DC

## 2016-01-02 NOTE — Patient Instructions (Addendum)
Medication Instructions:  CHANGE YOUR CLONIDINE TO 0.2 MG PATCH, APPLY PATCH ONCE A WEEK   Labwork: NONE  Testing/Procedures: NONE  Follow-Up: Your physician recommends that you schedule a follow-up appointment in: 1 MONTH FOLLOW UP  If you need a refill on your cardiac medications before your next appointment, please call your pharmacy.

## 2016-01-02 NOTE — Progress Notes (Signed)
d   Cardiology Office Note   Date:  01/02/2016   ID:  Deborah [name], DOB [birth year], MRN [id number]  PCP:  Mathews Argyle, MD  Cardiologist:   Skeet Latch, MD  GI: Dr. Sarina Ser  Chief Complaint  Patient presents with  . Follow-up    1 months; sob; has improved sense last OV. minor cramping in legs.      History of Present Illness: Deborah [name] is a 82 y.o. female with hypertension, hyperlipidemia, chronic atrial fibrillation, stroke, hypothyroidism, prior mechanical aortic valve replacement, and NSVT who presents for follow-up.  Deborah [name] was previously a patient of Dr. Mare Ferrari.  Deborah [name] previously developed a retroperitoneal hematoma on warfarin in September 2006 and was temporarily switched to Lovenox. However she was transitioned back to warfarin. Deborah [name] underwent upper endoscopy on 07/25/15 that showed a small hiatal hernia.  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  Her anticoagulation is managed by her oncologist, Dr. Doreen Salvage.  Her INR goal is 2-2.5. She continues following up with her oncologist for management of her anemia and requires frequent infusion of IV iron. She had a Lexiscan Cardiolite 04/2015 that was negative for ischemia. Her echo in 04/2015 showed mild to moderate aortic stenosis across her mechanical aortic valve (mean gradient 21 mmHg).  She had a subsequent echo 12/2015 due to recurrent shortness of breath.  The mean gradient was 18 mmHg and it was otherwise unchanged.    She noted increased swelling and shortness of breath and was seen by Ellen Henri on 11/23/15.  At that time she had a BNP that was 331 and potassium was 3.3.  She was started on lasix and HCTZ was discontinued. This improved her edema but she developed worsened episodes of hypertension. She was started on clonidine which has helped her blood pressure. However, she reports some fatigue and dry mouth.  She  was seen in the ED 12/27/15 with heart failure symptoms. She was felt to be mildly volume overloaded and was given lasix 60 mg IV with improvement in symptoms.  She saw Rosaria Ferries the following day and took Lasix 40 mg twice daily through the weekend. Her weight is down to 118 pounds and has been stable. She is taking Lasix 40 mg daily. Her breathing has improved significantly and she denies any lower extremity edema, orthopnea, or PND.  She did have some rectal bleeding over the weekend but this has subsided over the last 2 days. She goes to get labs drawn tomorrow to determine whether she needs transfusion.  Deborah [name] has not tolerated hydralazine due to abdominal discomfort. She develops following with higher doses of amlodipine. She developed bradycardia with higher doses of beta blocker.    Past Medical History:  Diagnosis Date  . Anemia   . Arthritis    "fingers, toes, hips; qwhere" (05/19/2013)  . Breast cancer ([place])    "right; S/P lumpectomy, chemo, XRT"   . Chronic lower GI bleeding   . Complication of anesthesia    "once I'm awakened, I don't sleep til the following night"  . GERD (gastroesophageal reflux disease)   . Gout   . Heart murmur   . High cholesterol   . History of blood transfusion 1996; ~ 2013   "related to valve replacement; GI bleeding"   . History of echocardiogram    Echo 1/17: mild LVH, EF 50-55%, no RWMA, mild AI, mechanical AVR with mean 21 mmHg/peak 42  mmHg (stable since 2012), mod MR, mod to severe BAE, PASP 36 mmHg  . History of nuclear stress test    Myoview 1/17: EF 59%, normal perfusion, low risk  . Hypertension   . Hypothyroidism   . Permanent atrial fibrillation ([place])       . Pneumonia 07/2011  . Rectus sheath hematoma 12/19/14    held anticoagulation for 1 week, transfused  . Stomach problems    seeing Dr Watt Climes  . TIA (transient ischemic attack) 12/24/14   symptoms resolved    Past Surgical History:  Procedure Laterality Date  .  ABDOMINAL HYSTERECTOMY    . AORTIC VALVE REPLACEMENT  1996   [employer]  . APPENDECTOMY    . arteriogram  01/12   NO RAS   . BREAST BIOPSY Right   . BREAST LUMPECTOMY Right   . CARDIAC CATHETERIZATION    . CARDIOVERSION     X 2  . CATARACT EXTRACTION W/ INTRAOCULAR LENS  IMPLANT, BILATERAL Bilateral    bilateral cataract with lens implants  . CHOLECYSTECTOMY    . COLONOSCOPY WITH PROPOFOL N/A 11/24/2012   Procedure: COLONOSCOPY WITH PROPOFOL;  Surgeon: Jeryl Columbia, MD;  Location: [facility] ENDOSCOPY;  Service: Endoscopy;  Laterality: N/A;  . ESOPHAGOGASTRODUODENOSCOPY (EGD) WITH PROPOFOL N/A 07/25/2015   Procedure: ESOPHAGOGASTRODUODENOSCOPY (EGD) WITH PROPOFOL;  Surgeon: Clarene Essex, MD;  Location: [facility] ENDOSCOPY;  Service: Endoscopy;  Laterality: N/A;  . HOT HEMOSTASIS N/A 07/25/2015   Procedure: HOT HEMOSTASIS (ARGON PLASMA COAGULATION/BICAP);  Surgeon: Clarene Essex, MD;  Location: [facility] ENDOSCOPY;  Service: Endoscopy;  Laterality: N/A;     Current Outpatient Prescriptions  Medication Sig Dispense Refill  . acetaminophen (TYLENOL) 500 MG tablet Take 1,000 mg by mouth every 6 (six) hours as needed for mild pain, moderate pain or headache. Reported on 09/26/2015    . allopurinol (ZYLOPRIM) 300 MG tablet Take 300 mg by mouth at bedtime.     Marland Kitchen amLODipine (NORVASC) 5 MG tablet Take 5 mg by mouth at bedtime.    . calcium-vitamin D 500 MG tablet Take 1 tablet by mouth 2 (two) times daily. Reported on 09/26/2015    . carvedilol (COREG) 6.25 MG tablet Take 1 tablet (6.25 mg total) by mouth 2 (two) times daily. 60 tablet 3  . celecoxib (CELEBREX) 200 MG capsule Take 200 mg by mouth every other day.   4  . dicyclomine (BENTYL) 10 MG capsule Take 10 mg by mouth daily as needed for spasms.     . fexofenadine (ALLEGRA ALLERGY) 180 MG tablet Take 180 mg by mouth daily.     . furosemide (LASIX) 40 MG tablet Take 1-2 tablets daily as directed. 45 tablet 6  . hydrOXYzine (ATARAX/VISTARIL) 25 MG tablet Take 25 mg by  mouth 3 (three) times daily as needed for anxiety.    Marland Kitchen levothyroxine (SYNTHROID, LEVOTHROID) 88 MCG tablet Take 1 tablet (88 mcg total) by mouth daily before breakfast. 90 tablet 3  . NITROSTAT 0.4 MG SL tablet Place 0.4 mg under the tongue every 5 (five) minutes as needed for chest pain (x 3 doses). Reported on 10/17/2015  1  . potassium chloride SA (K-DUR,KLOR-CON) 20 MEQ tablet Take 10 meq (1/2 tab) as directed. 15 tablet 6  . Probiotic Product (PROBIOTIC ADVANCED PO) Take 1 tablet by mouth daily. Reported on 10/17/2015    . simvastatin (ZOCOR) 20 MG tablet Take 10 mg by mouth daily. TAKE 1/2 TAB    . valsartan (DIOVAN) 320 MG tablet Take 1  tablet (320 mg total) by mouth daily. 30 tablet 5  . warfarin (COUMADIN) 5 MG tablet Take 5 mg by mouth daily. Take 2.'5mg'$ -'5mg'$  tablet daily as directed, currently pt is take '5mg'$  on Saturday, Sunday, Tuesday and Thursdays, and 2.'5mg'$  on Monday, Wednesday, and Fridays    . zolpidem (AMBIEN CR) 12.5 MG CR tablet Take 12.5 mg by mouth at bedtime as needed for sleep.    . cloNIDine (CATAPRES - DOSED IN MG/24 HR) 0.2 mg/24hr patch Place 1 patch (0.2 mg total) onto the skin once a week. 4 patch 12   No current facility-administered medications for this visit.     Allergies:   Amiodarone; Chlorthalidone; Alendronate sodium; Macrodantin [nitrofurantoin]; Meclizine; Norvasc [amlodipine besylate]; Hydralazine hcl; and Tussionex pennkinetic er [hydrocod polst-cpm polst er]    Social History:  The patient  reports that she quit smoking about 27 years ago. Her smoking use included Cigarettes. She has a 20.00 pack-year smoking history. She has never used smokeless tobacco. She reports that she drinks about 4.8 oz of alcohol per week . She reports that she does not use drugs.   Family History:  The patient's family history includes Breast cancer in her daughter; COPD in her brother; Colon cancer in her sister; Coronary artery disease in her father; Dementia in her mother;  Heart attack in her father; Liver cancer in her sister; Lung cancer in her brother.    ROS:  Please see the history of present illness.   Otherwise, review of systems are positive for none.   All other systems are reviewed and negative.    PHYSICAL EXAM: VS:  BP (!) 115/58   Pulse (!) 59   Ht 5' (1.524 m)   Wt 118 lb 3.2 oz (53.6 kg)   BMI 23.08 kg/m  , BMI Body mass index is 23.08 kg/m. GENERAL:  Well appearing HEENT:  Pupils equal round and reactive, fundi not visualized, oral mucosa unremarkable.  Conjuntival pallor.  NECK:  JVP at the clavicle at 45 degrees. Waveform within normal limits, carotid upstroke brisk and symmetric, no bruits LYMPHATICS:  No cervical adenopathy LUNGS:  Clear to auscultation bilaterally HEART:  RRR.  PMI not displaced or sustained,S1 within normal limits, mechanical S2, no S3, no S4, no clicks, no rubs, II/VI systolic murmur. ABD:  Flat, positive bowel sounds normal in frequency in pitch, no bruits, no rebound, no guarding, no midline pulsatile mass, no hepatomegaly, no splenomegaly EXT:  2 plus pulses throughout, no edema, no cyanosis no clubbing SKIN:  No rashes no nodules NEURO:  Cranial nerves II through XII grossly intact, motor grossly intact throughout PSYCH:  Cognitively intact, oriented to person place and time  EKG:  EKG is not ordered today. The ekg ordered 06/30/15 demonstrates atrial bigeminy.  Rate 75 bpm.  Prior septal infarct.  Lateral ST-T changes consistent with ischemia.   Echo 04/05/15: Study Conclusions  - Left ventricle: The cavity size was normal. There was mild   concentric hypertrophy. Systolic function was normal. The   estimated ejection fraction was in the range of 50% to 55%. Wall   motion was normal; there were no regional wall motion   abnormalities. The study is not technically sufficient to allow   evaluation of LV diastolic function. - Aortic valve: A mechanical prosthesis was present. The gradients   across the  prosthesis are similar to the study of 2012. There was   mild regurgitation. Peak velocity (S): 325 cm/s. Mean gradient   (S): 21 mm  Hg. - Mitral valve: There was moderate regurgitation. - Left atrium: The atrium was moderately to severely dilated. - Right ventricle: The cavity size was moderately dilated. Wall   thickness was normal. - Right atrium: The atrium was moderately to severely dilated. - Pulmonary arteries: Systolic pressure was mildly increased. PA   peak pressure: 36 mm Hg (S).   Recent Labs: 12/27/2015: ALT 20; B Natriuretic Peptide 279.2; BUN 31.4; Creatinine 0.9; Potassium 4.4; Sodium 133 01/01/2016: HGB 9.6; Platelets 179    Lipid Panel    Component Value Date/Time   CHOL 153 11/08/2015 0959   TRIG 139 11/08/2015 0959   HDL 59 11/08/2015 0959   CHOLHDL 2.5 12/26/2014 0356   VLDL 19 12/26/2014 0356   LDLCALC 66 11/08/2015 0959   LDLDIRECT 93.7 04/10/2011 1000      Wt Readings from Last 3 Encounters:  01/02/16 118 lb 3.2 oz (53.6 kg)  12/28/15 120 lb (54.4 kg)  12/27/15 118 lb (53.5 kg)      ASSESSMENT AND PLAN:   # Chronic diastolic heart failure:  Ms. Mcclarty is doing much better after Getting IV Lasix in the hospital. She has been taking Lasix twice daily and has reduced his back onto daily. She continues to weigh herself each day. If her weight increases or she feels more short of breath she will start back taking it twice daily.  # Hypertension: Ms. Haye blood pressure is better-controlled.  She is complaining of dry mouth.  We will try the patch instead.  Stop clonidine pills and start the patch 0.'2mg'$  daily.  Continue amlodipine and valsartan.    # Atrial fibrillation: Currently in atrial fibrillation and rates are well-controlled.  Continue carvedilol. Continue anticoagulation with warfarin, INR goal 2-2.5 due to GI bleeding.    This patients CHA2DS2-VASc Score and unadjusted Ischemic Stroke Rate (% per year) is equal to 4.8 % stroke rate/year  from a score of 4  Above score calculated as 1 point each if present [CHF, HTN, DM, Vascular=MI/PAD/Aortic Plaque, Age if 65-74, or Female] Above score calculated as 2 points each if present [Age > 75, or Stroke/TIA/TE]   # s/p mechanical AVR: Stable with mildly increased gradients.  Mean gradient was 18 mmHg on echo 12/2015. INR goal 2-2.5 due to GI bleeding requiring transfusions.   # Hyperlipidemia: LDL 66 10/2015.Dictation #1 [id number]  [id number]   She will continue simvastatin.  Current medicines are reviewed at length with the patient today.  The patient does not have concerns regarding medicines.  The following changes have been made:  None  Labs/ tests ordered today include:   No orders of the defined types were placed in this encounter.    Disposition:   FU with Mailynn Everly C. Oval Linsey, MD, [facility] in 1 month   This note was written with the assistance of speech recognition software.  Please excuse any transcriptional errors.  Signed, Olen Eaves C. Oval Linsey, MD, [facility]  01/02/2016 12:16 PM    [place]

## 2016-01-03 ENCOUNTER — Other Ambulatory Visit: Payer: Self-pay | Admitting: *Deleted

## 2016-01-03 ENCOUNTER — Other Ambulatory Visit (HOSPITAL_BASED_OUTPATIENT_CLINIC_OR_DEPARTMENT_OTHER): Payer: Medicare Other

## 2016-01-03 ENCOUNTER — Ambulatory Visit (HOSPITAL_BASED_OUTPATIENT_CLINIC_OR_DEPARTMENT_OTHER): Payer: Medicare Other

## 2016-01-03 ENCOUNTER — Other Ambulatory Visit: Payer: Self-pay | Admitting: Medical Oncology

## 2016-01-03 ENCOUNTER — Telehealth: Payer: Self-pay | Admitting: *Deleted

## 2016-01-03 DIAGNOSIS — K922 Gastrointestinal hemorrhage, unspecified: Secondary | ICD-10-CM

## 2016-01-03 DIAGNOSIS — D500 Iron deficiency anemia secondary to blood loss (chronic): Secondary | ICD-10-CM

## 2016-01-03 DIAGNOSIS — D528 Other folate deficiency anemias: Secondary | ICD-10-CM

## 2016-01-03 DIAGNOSIS — R5383 Other fatigue: Principal | ICD-10-CM

## 2016-01-03 DIAGNOSIS — D649 Anemia, unspecified: Secondary | ICD-10-CM | POA: Diagnosis not present

## 2016-01-03 DIAGNOSIS — D689 Coagulation defect, unspecified: Secondary | ICD-10-CM

## 2016-01-03 DIAGNOSIS — R5381 Other malaise: Secondary | ICD-10-CM

## 2016-01-03 LAB — PROTIME-INR
INR: 2.8 (ref 2.00–3.50)
PROTIME: 33.6 s — AB (ref 10.6–13.4)

## 2016-01-03 LAB — CBC WITH DIFFERENTIAL/PLATELET
BASO%: 0.6 % (ref 0.0–2.0)
Basophils Absolute: 0 10*3/uL (ref 0.0–0.1)
EOS%: 1.9 % (ref 0.0–7.0)
Eosinophils Absolute: 0.1 10*3/uL (ref 0.0–0.5)
HCT: 29.5 % — ABNORMAL LOW (ref 34.8–46.6)
HGB: 9.4 g/dL — ABNORMAL LOW (ref 11.6–15.9)
LYMPH%: 12.6 % — AB (ref 14.0–49.7)
MCH: 32.6 pg (ref 25.1–34.0)
MCHC: 31.9 g/dL (ref 31.5–36.0)
MCV: 102.4 fL — ABNORMAL HIGH (ref 79.5–101.0)
MONO#: 0.4 10*3/uL (ref 0.1–0.9)
MONO%: 7.6 % (ref 0.0–14.0)
NEUT%: 77.3 % — ABNORMAL HIGH (ref 38.4–76.8)
NEUTROS ABS: 4 10*3/uL (ref 1.5–6.5)
NRBC: 0 % (ref 0–0)
Platelets: 176 10*3/uL (ref 145–400)
RBC: 2.88 10*6/uL — AB (ref 3.70–5.45)
RDW: 16.7 % — AB (ref 11.2–14.5)
WBC: 5.2 10*3/uL (ref 3.9–10.3)
lymph#: 0.7 10*3/uL — ABNORMAL LOW (ref 0.9–3.3)

## 2016-01-03 LAB — PREPARE RBC (CROSSMATCH)

## 2016-01-03 MED ORDER — ACETAMINOPHEN 325 MG PO TABS
ORAL_TABLET | ORAL | Status: AC
  Filled 2016-01-03: qty 2

## 2016-01-03 MED ORDER — ACETAMINOPHEN 325 MG PO TABS
650.0000 mg | ORAL_TABLET | Freq: Once | ORAL | Status: AC
  Administered 2016-01-03: 650 mg via ORAL

## 2016-01-03 MED ORDER — SODIUM CHLORIDE 0.9 % IV SOLN
250.0000 mL | Freq: Once | INTRAVENOUS | Status: AC
  Administered 2016-01-03: 250 mL via INTRAVENOUS

## 2016-01-03 MED ORDER — SODIUM CHLORIDE 0.9% FLUSH
10.0000 mL | INTRAVENOUS | Status: AC | PRN
  Administered 2016-01-03: 10 mL
  Filled 2016-01-03: qty 10

## 2016-01-03 MED ORDER — HEPARIN SOD (PORK) LOCK FLUSH 100 UNIT/ML IV SOLN
500.0000 [IU] | Freq: Every day | INTRAVENOUS | Status: AC | PRN
  Administered 2016-01-03: 500 [IU]
  Filled 2016-01-03: qty 5

## 2016-01-03 MED ORDER — DIPHENHYDRAMINE HCL 25 MG PO CAPS
ORAL_CAPSULE | ORAL | Status: AC
  Filled 2016-01-03: qty 1

## 2016-01-03 MED ORDER — DIPHENHYDRAMINE HCL 25 MG PO CAPS
25.0000 mg | ORAL_CAPSULE | Freq: Once | ORAL | Status: AC
  Administered 2016-01-03: 25 mg via ORAL

## 2016-01-03 NOTE — Telephone Encounter (Signed)
Per MD review of labs and pt's status r/t CHF and SOB- requested for 1 unit of PRBC's today.  Emaley states 1 episode of stool with minimal darkness " nothing like it can be ".  Kalana states she took 40 mg of lasix yesterday ( post 5 day bid dosing ).  MD recommended for pt to take lasix bid today as well.  No change in coumadin dose.  Labs to be rechecked next Tuesday.  Above given in writing to pt, treatment room nurse notified and message left on VM of pt's dtr [facility].

## 2016-01-03 NOTE — Patient Instructions (Signed)

## 2016-01-04 LAB — TYPE AND SCREEN
ABO/RH(D): O POS
ANTIBODY SCREEN: NEGATIVE
UNIT DIVISION: 0

## 2016-01-08 ENCOUNTER — Other Ambulatory Visit: Payer: Self-pay | Admitting: *Deleted

## 2016-01-08 ENCOUNTER — Telehealth: Payer: Self-pay | Admitting: Cardiovascular Disease

## 2016-01-08 DIAGNOSIS — I4891 Unspecified atrial fibrillation: Secondary | ICD-10-CM

## 2016-01-08 DIAGNOSIS — D500 Iron deficiency anemia secondary to blood loss (chronic): Secondary | ICD-10-CM

## 2016-01-08 DIAGNOSIS — K921 Melena: Secondary | ICD-10-CM

## 2016-01-08 DIAGNOSIS — D689 Coagulation defect, unspecified: Secondary | ICD-10-CM

## 2016-01-08 NOTE — Telephone Encounter (Signed)
Yes - we can do labs whenever on [place] - I will schedule for another lab on 17 at her regular time of 10 am - thanks  Val

## 2016-01-08 NOTE — Telephone Encounter (Signed)
Thank you :)

## 2016-01-08 NOTE — Telephone Encounter (Signed)
No problem, and thank you! But, she needs a BMET in a week because Dr. Oval Linsey is changing her Lasix. Would she be able to have it done there a week from tomorrow?

## 2016-01-08 NOTE — Telephone Encounter (Signed)
Spoke to patient, gave lab results. Pt voiced understanding but wanted to know if she should take more lasix if she starts swelling again. Also wants to have labwork (BMP) [facility] with Dr. Jana Hakim.  Will route to [place]/Dr. Oval Linsey and [place] Flow D[place]/Dr. Magrinat.

## 2016-01-08 NOTE — Telephone Encounter (Signed)
Thank you guys - will obtain a bmet at appointment tomorrow. I hope she was able to enjoy her grand daughter's wedding- Val

## 2016-01-08 NOTE — Telephone Encounter (Signed)
-----   Message from Lonn Georgia, PA-C sent at 01/05/2016  8:27 AM EDT ----- Her BUN/Cr were up on last BMET, can you call her with Dr Blenda Mounts instructions? Thanks ----- Message ----- From: Skeet Latch, MD Sent: 01/05/2016   6:32 AM To: Lonn Georgia, PA-C, [facility]  Hold lasix 2 days and then resume at once per day.  Repeat BMP in 1 week.

## 2016-01-09 ENCOUNTER — Other Ambulatory Visit: Payer: Medicare Other

## 2016-01-09 ENCOUNTER — Other Ambulatory Visit (HOSPITAL_BASED_OUTPATIENT_CLINIC_OR_DEPARTMENT_OTHER): Payer: Medicare Other

## 2016-01-09 ENCOUNTER — Ambulatory Visit (HOSPITAL_BASED_OUTPATIENT_CLINIC_OR_DEPARTMENT_OTHER): Payer: Medicare Other

## 2016-01-09 ENCOUNTER — Other Ambulatory Visit: Payer: Self-pay | Admitting: *Deleted

## 2016-01-09 ENCOUNTER — Ambulatory Visit: Payer: Medicare Other | Admitting: Cardiovascular Disease

## 2016-01-09 DIAGNOSIS — D649 Anemia, unspecified: Secondary | ICD-10-CM

## 2016-01-09 DIAGNOSIS — D500 Iron deficiency anemia secondary to blood loss (chronic): Secondary | ICD-10-CM

## 2016-01-09 DIAGNOSIS — D689 Coagulation defect, unspecified: Secondary | ICD-10-CM

## 2016-01-09 DIAGNOSIS — R0602 Shortness of breath: Secondary | ICD-10-CM

## 2016-01-09 DIAGNOSIS — I4891 Unspecified atrial fibrillation: Secondary | ICD-10-CM | POA: Diagnosis not present

## 2016-01-09 DIAGNOSIS — K317 Polyp of stomach and duodenum: Secondary | ICD-10-CM

## 2016-01-09 DIAGNOSIS — K922 Gastrointestinal hemorrhage, unspecified: Secondary | ICD-10-CM

## 2016-01-09 DIAGNOSIS — K649 Unspecified hemorrhoids: Secondary | ICD-10-CM

## 2016-01-09 DIAGNOSIS — R5381 Other malaise: Secondary | ICD-10-CM

## 2016-01-09 DIAGNOSIS — K5790 Diverticulosis of intestine, part unspecified, without perforation or abscess without bleeding: Secondary | ICD-10-CM

## 2016-01-09 DIAGNOSIS — Q2730 Arteriovenous malformation, site unspecified: Secondary | ICD-10-CM

## 2016-01-09 DIAGNOSIS — R5383 Other fatigue: Principal | ICD-10-CM

## 2016-01-09 DIAGNOSIS — Z7901 Long term (current) use of anticoagulants: Secondary | ICD-10-CM

## 2016-01-09 LAB — PROTIME-INR
INR: 3.8 — AB (ref 2.00–3.50)
PROTIME: 45.6 s — AB (ref 10.6–13.4)

## 2016-01-09 LAB — COMPREHENSIVE METABOLIC PANEL
ALBUMIN: 3.1 g/dL — AB (ref 3.5–5.0)
ALK PHOS: 48 U/L (ref 40–150)
ALT: 16 U/L (ref 0–55)
ANION GAP: 7 meq/L (ref 3–11)
AST: 16 U/L (ref 5–34)
BUN: 69 mg/dL — ABNORMAL HIGH (ref 7.0–26.0)
CALCIUM: 9.6 mg/dL (ref 8.4–10.4)
CO2: 26 mEq/L (ref 22–29)
CREATININE: 1.2 mg/dL — AB (ref 0.6–1.1)
Chloride: 106 mEq/L (ref 98–109)
EGFR: 40 mL/min/{1.73_m2} — ABNORMAL LOW (ref >90)
Glucose: 107 mg/dl (ref 70–140)
Potassium: 4.2 mEq/L (ref 3.5–5.1)
Sodium: 139 mEq/L (ref 136–145)
TOTAL PROTEIN: 5.4 g/dL — AB (ref 6.4–8.3)

## 2016-01-09 LAB — PREPARE RBC (CROSSMATCH)

## 2016-01-09 LAB — CBC WITH DIFFERENTIAL/PLATELET
BASO%: 0.5 % (ref 0.0–2.0)
Basophils Absolute: 0 10*3/uL (ref 0.0–0.1)
EOS ABS: 0.1 10*3/uL (ref 0.0–0.5)
EOS%: 1.3 % (ref 0.0–7.0)
HCT: 24.7 % — ABNORMAL LOW (ref 34.8–46.6)
HGB: 7.9 g/dL — ABNORMAL LOW (ref 11.6–15.9)
LYMPH%: 9.6 % — AB (ref 14.0–49.7)
MCH: 32.3 pg (ref 25.1–34.0)
MCHC: 32 g/dL (ref 31.5–36.0)
MCV: 101.1 fL — AB (ref 79.5–101.0)
MONO#: 0.3 10*3/uL (ref 0.1–0.9)
MONO%: 6.5 % (ref 0.0–14.0)
NEUT#: 3.4 10*3/uL (ref 1.5–6.5)
NEUT%: 82.1 % — AB (ref 38.4–76.8)
PLATELETS: 175 10*3/uL (ref 145–400)
RBC: 2.44 10*6/uL — AB (ref 3.70–5.45)
RDW: 18.6 % — ABNORMAL HIGH (ref 11.2–14.5)
WBC: 4.1 10*3/uL (ref 3.9–10.3)
lymph#: 0.4 10*3/uL — ABNORMAL LOW (ref 0.9–3.3)

## 2016-01-09 LAB — FERRITIN: Ferritin: 60 ng/ml (ref 9–269)

## 2016-01-09 MED ORDER — PHYTONADIONE 10 MG/ML INJECTION
1.0000 mg | Freq: Once | INTRAMUSCULAR | Status: AC
  Administered 2016-01-09: 1 mg via SUBCUTANEOUS
  Filled 2016-01-09: qty 1

## 2016-01-09 MED ORDER — SODIUM CHLORIDE 0.9 % IV SOLN
250.0000 mL | Freq: Once | INTRAVENOUS | Status: AC
  Administered 2016-01-09: 250 mL via INTRAVENOUS

## 2016-01-09 MED ORDER — HEPARIN SOD (PORK) LOCK FLUSH 100 UNIT/ML IV SOLN
500.0000 [IU] | Freq: Every day | INTRAVENOUS | Status: AC | PRN
  Administered 2016-01-09: 500 [IU]
  Filled 2016-01-09: qty 5

## 2016-01-09 MED ORDER — FUROSEMIDE 20 MG PO TABS
ORAL_TABLET | ORAL | Status: AC
  Filled 2016-01-09: qty 1

## 2016-01-09 MED ORDER — SODIUM CHLORIDE 0.9% FLUSH
3.0000 mL | INTRAVENOUS | Status: DC | PRN
  Filled 2016-01-09: qty 10

## 2016-01-09 MED ORDER — SODIUM CHLORIDE 0.9% FLUSH
10.0000 mL | INTRAVENOUS | Status: AC | PRN
  Administered 2016-01-09: 10 mL
  Filled 2016-01-09: qty 10

## 2016-01-09 MED ORDER — ACETAMINOPHEN 325 MG PO TABS
650.0000 mg | ORAL_TABLET | Freq: Once | ORAL | Status: AC
  Administered 2016-01-09: 650 mg via ORAL

## 2016-01-09 MED ORDER — ACETAMINOPHEN 325 MG PO TABS
ORAL_TABLET | ORAL | Status: AC
  Filled 2016-01-09: qty 2

## 2016-01-09 MED ORDER — DIPHENHYDRAMINE HCL 25 MG PO CAPS
ORAL_CAPSULE | ORAL | Status: AC
  Filled 2016-01-09: qty 1

## 2016-01-09 MED ORDER — HEPARIN SOD (PORK) LOCK FLUSH 100 UNIT/ML IV SOLN
250.0000 [IU] | INTRAVENOUS | Status: DC | PRN
  Filled 2016-01-09: qty 5

## 2016-01-09 MED ORDER — DIPHENHYDRAMINE HCL 25 MG PO CAPS
25.0000 mg | ORAL_CAPSULE | Freq: Once | ORAL | Status: AC
  Administered 2016-01-09: 25 mg via ORAL

## 2016-01-09 MED ORDER — FUROSEMIDE 20 MG PO TABS
10.0000 mg | ORAL_TABLET | Freq: Once | ORAL | Status: AC
  Administered 2016-01-09: 10 mg via ORAL

## 2016-01-09 NOTE — Telephone Encounter (Signed)
Advised patient

## 2016-01-09 NOTE — Patient Instructions (Addendum)
Stay off COUMADIN until told to resume by your doctor. Follow up tomorrow for lab work.  Blood Transfusion  A blood transfusion is a procedure in which you receive donated blood through an IV tube. You may need a blood transfusion because of illness, surgery, or injury. The blood may come from a donor, or it may be your own blood that you donated previously. The blood given in a transfusion is made up of different types of cells. You may receive:  Red blood cells. These carry oxygen and replace lost blood.  Platelets. These control bleeding.  Plasma. Thishelps blood to clot. If you have hemophilia or another clotting disorder, you may also receive other types of blood products. LET [facility] CARE PROVIDER KNOW ABOUT:  Any allergies you have.  All medicines you are taking, including vitamins, herbs, eye drops, creams, and over-the-counter medicines.  Previous problems you or members of your family have had with the use of anesthetics.  Any blood disorders you have.  Previous surgeries you have had.  Any medical conditions you may have.  Any previous reactions you have had during a blood transfusion.  RISKS AND COMPLICATIONS Generally, this is a safe procedure. However, problems may occur, including:  Having an allergic reaction to something in the donated blood.  Fever. This may be a reaction to the white blood cells in the transfused blood.  Iron overload. This can happen from having many transfusions.  Transfusion-related acute lung injury (TRALI). This is a rare reaction that causes lung damage. The cause is not known.TRALI can occur within hours of a transfusion or several days later.  Sudden (acute) or delayed hemolytic reactions. This happens if your blood does not match the cells in your transfusion. Your body's defense system (immune system) may try to attack the new cells. This complication is rare.  Infection. This is rare. BEFORE THE PROCEDURE  You may have a  blood test to determine your blood type. This is necessary to know what kind of blood your body will accept.  If you are going to have a planned surgery, you may donate your own blood. This may be done in case you need to have a transfusion.  If you have had an allergic reaction to a transfusion in the past, you may be given medicine to help prevent a reaction. Take this medicine only as directed by your health care provider.  You will have your temperature, blood pressure, and pulse monitored before the transfusion. PROCEDURE   An IV will be started in your hand or arm.  The bag of donated blood will be attached to your IV tube and given into your vein.  Your temperature, blood pressure, and pulse will be monitored regularly during the transfusion. This monitoring is done to detect early signs of a transfusion reaction.  If you have any signs or symptoms of a reaction, your transfusion will be stopped and you may be given medicine.  When the transfusion is over, your IV will be removed.  Pressure may be applied to the IV site for a few minutes.  A bandage (dressing) will be applied. The procedure may vary among health care providers and hospitals. AFTER THE PROCEDURE  Your blood pressure, temperature, and pulse will be monitored regularly.   This information is not intended to replace advice given to you by your health care provider. Make sure you discuss any questions you have with your health care provider.   Document Released: 03/15/2000 Document Revised: 04/08/2014 Document  Reviewed: 01/26/2014 Elsevier Interactive Patient Education [employer].

## 2016-01-09 NOTE — Progress Notes (Signed)
At 1608 complaining of shortness of breath after walking and sitting in chair, 2 unit of blood almost infused. Vital signs obtained, Dr. Jana Hakim notified thru Nurse, Val. Order obtained for 10 mg Lasix given po. Discharged home and instructed to call 911 or go to emergency room for any further shortness of breath.

## 2016-01-09 NOTE — Telephone Encounter (Signed)
Thank you very much for your help.  If she develops shortness of breath, take the lasix.

## 2016-01-10 ENCOUNTER — Encounter (HOSPITAL_COMMUNITY): Payer: Medicare Other

## 2016-01-10 ENCOUNTER — Other Ambulatory Visit: Payer: Self-pay

## 2016-01-10 ENCOUNTER — Emergency Department (HOSPITAL_COMMUNITY): Payer: Medicare Other

## 2016-01-10 ENCOUNTER — Other Ambulatory Visit (HOSPITAL_BASED_OUTPATIENT_CLINIC_OR_DEPARTMENT_OTHER): Payer: Medicare Other

## 2016-01-10 ENCOUNTER — Ambulatory Visit: Payer: Medicare Other

## 2016-01-10 ENCOUNTER — Other Ambulatory Visit: Payer: Self-pay | Admitting: Oncology

## 2016-01-10 ENCOUNTER — Encounter (HOSPITAL_COMMUNITY): Payer: Self-pay

## 2016-01-10 ENCOUNTER — Observation Stay (HOSPITAL_COMMUNITY)
Admission: EM | Admit: 2016-01-10 | Discharge: 2016-01-11 | Disposition: A | Discharge status: Home / Self Care | Payer: Medicare Other | Attending: Internal Medicine | Admitting: Internal Medicine

## 2016-01-10 DIAGNOSIS — K317 Polyp of stomach and duodenum: Secondary | ICD-10-CM | POA: Insufficient documentation

## 2016-01-10 DIAGNOSIS — I4891 Unspecified atrial fibrillation: Secondary | ICD-10-CM | POA: Diagnosis present

## 2016-01-10 DIAGNOSIS — D689 Coagulation defect, unspecified: Secondary | ICD-10-CM

## 2016-01-10 DIAGNOSIS — K922 Gastrointestinal hemorrhage, unspecified: Secondary | ICD-10-CM | POA: Diagnosis present

## 2016-01-10 DIAGNOSIS — I272 Pulmonary hypertension, unspecified: Secondary | ICD-10-CM | POA: Diagnosis not present

## 2016-01-10 DIAGNOSIS — K449 Diaphragmatic hernia without obstruction or gangrene: Secondary | ICD-10-CM | POA: Insufficient documentation

## 2016-01-10 DIAGNOSIS — M1990 Unspecified osteoarthritis, unspecified site: Secondary | ICD-10-CM | POA: Diagnosis not present

## 2016-01-10 DIAGNOSIS — Q2730 Arteriovenous malformation, site unspecified: Secondary | ICD-10-CM

## 2016-01-10 DIAGNOSIS — Z8249 Family history of ischemic heart disease and other diseases of the circulatory system: Secondary | ICD-10-CM | POA: Insufficient documentation

## 2016-01-10 DIAGNOSIS — K921 Melena: Secondary | ICD-10-CM

## 2016-01-10 DIAGNOSIS — I700 Atherosclerosis of aorta: Secondary | ICD-10-CM | POA: Diagnosis not present

## 2016-01-10 DIAGNOSIS — I509 Heart failure, unspecified: Secondary | ICD-10-CM

## 2016-01-10 DIAGNOSIS — I5021 Acute systolic (congestive) heart failure: Secondary | ICD-10-CM

## 2016-01-10 DIAGNOSIS — J9601 Acute respiratory failure with hypoxia: Secondary | ICD-10-CM

## 2016-01-10 DIAGNOSIS — R918 Other nonspecific abnormal finding of lung field: Secondary | ICD-10-CM

## 2016-01-10 DIAGNOSIS — I472 Ventricular tachycardia: Secondary | ICD-10-CM | POA: Insufficient documentation

## 2016-01-10 DIAGNOSIS — E039 Hypothyroidism, unspecified: Secondary | ICD-10-CM | POA: Diagnosis not present

## 2016-01-10 DIAGNOSIS — Z952 Presence of prosthetic heart valve: Secondary | ICD-10-CM | POA: Insufficient documentation

## 2016-01-10 DIAGNOSIS — Z800 Family history of malignant neoplasm of digestive organs: Secondary | ICD-10-CM | POA: Insufficient documentation

## 2016-01-10 DIAGNOSIS — I712 Thoracic aortic aneurysm, without rupture: Secondary | ICD-10-CM | POA: Insufficient documentation

## 2016-01-10 DIAGNOSIS — Z803 Family history of malignant neoplasm of breast: Secondary | ICD-10-CM | POA: Insufficient documentation

## 2016-01-10 DIAGNOSIS — R0602 Shortness of breath: Secondary | ICD-10-CM | POA: Diagnosis not present

## 2016-01-10 DIAGNOSIS — D500 Iron deficiency anemia secondary to blood loss (chronic): Secondary | ICD-10-CM | POA: Diagnosis present

## 2016-01-10 DIAGNOSIS — M109 Gout, unspecified: Secondary | ICD-10-CM | POA: Diagnosis not present

## 2016-01-10 DIAGNOSIS — E871 Hypo-osmolality and hyponatremia: Secondary | ICD-10-CM | POA: Insufficient documentation

## 2016-01-10 DIAGNOSIS — I5032 Chronic diastolic (congestive) heart failure: Secondary | ICD-10-CM | POA: Insufficient documentation

## 2016-01-10 DIAGNOSIS — Z9841 Cataract extraction status, right eye: Secondary | ICD-10-CM | POA: Insufficient documentation

## 2016-01-10 DIAGNOSIS — N179 Acute kidney failure, unspecified: Secondary | ICD-10-CM | POA: Insufficient documentation

## 2016-01-10 DIAGNOSIS — E876 Hypokalemia: Secondary | ICD-10-CM | POA: Insufficient documentation

## 2016-01-10 DIAGNOSIS — J90 Pleural effusion, not elsewhere classified: Principal | ICD-10-CM | POA: Insufficient documentation

## 2016-01-10 DIAGNOSIS — Z794 Long term (current) use of insulin: Secondary | ICD-10-CM | POA: Insufficient documentation

## 2016-01-10 DIAGNOSIS — Z8673 Personal history of transient ischemic attack (TIA), and cerebral infarction without residual deficits: Secondary | ICD-10-CM | POA: Insufficient documentation

## 2016-01-10 DIAGNOSIS — Z9221 Personal history of antineoplastic chemotherapy: Secondary | ICD-10-CM | POA: Insufficient documentation

## 2016-01-10 DIAGNOSIS — Z87891 Personal history of nicotine dependence: Secondary | ICD-10-CM | POA: Diagnosis not present

## 2016-01-10 DIAGNOSIS — Z79899 Other long term (current) drug therapy: Secondary | ICD-10-CM | POA: Insufficient documentation

## 2016-01-10 DIAGNOSIS — I482 Chronic atrial fibrillation, unspecified: Secondary | ICD-10-CM

## 2016-01-10 DIAGNOSIS — Z9889 Other specified postprocedural states: Secondary | ICD-10-CM

## 2016-01-10 DIAGNOSIS — Z853 Personal history of malignant neoplasm of breast: Secondary | ICD-10-CM | POA: Insufficient documentation

## 2016-01-10 DIAGNOSIS — Z9049 Acquired absence of other specified parts of digestive tract: Secondary | ICD-10-CM | POA: Insufficient documentation

## 2016-01-10 DIAGNOSIS — E7800 Pure hypercholesterolemia, unspecified: Secondary | ICD-10-CM | POA: Diagnosis not present

## 2016-01-10 DIAGNOSIS — K219 Gastro-esophageal reflux disease without esophagitis: Secondary | ICD-10-CM | POA: Insufficient documentation

## 2016-01-10 DIAGNOSIS — Z923 Personal history of irradiation: Secondary | ICD-10-CM | POA: Insufficient documentation

## 2016-01-10 DIAGNOSIS — Z820 Family history of epilepsy and other diseases of the nervous system: Secondary | ICD-10-CM | POA: Insufficient documentation

## 2016-01-10 DIAGNOSIS — I10 Essential (primary) hypertension: Secondary | ICD-10-CM | POA: Diagnosis present

## 2016-01-10 DIAGNOSIS — Z881 Allergy status to other antibiotic agents status: Secondary | ICD-10-CM | POA: Insufficient documentation

## 2016-01-10 DIAGNOSIS — I110 Hypertensive heart disease with heart failure: Secondary | ICD-10-CM | POA: Diagnosis not present

## 2016-01-10 DIAGNOSIS — Z801 Family history of malignant neoplasm of trachea, bronchus and lung: Secondary | ICD-10-CM | POA: Insufficient documentation

## 2016-01-10 DIAGNOSIS — Z888 Allergy status to other drugs, medicaments and biological substances status: Secondary | ICD-10-CM | POA: Insufficient documentation

## 2016-01-10 DIAGNOSIS — R0600 Dyspnea, unspecified: Secondary | ICD-10-CM | POA: Insufficient documentation

## 2016-01-10 DIAGNOSIS — Z9842 Cataract extraction status, left eye: Secondary | ICD-10-CM | POA: Insufficient documentation

## 2016-01-10 DIAGNOSIS — Z7901 Long term (current) use of anticoagulants: Secondary | ICD-10-CM | POA: Insufficient documentation

## 2016-01-10 DIAGNOSIS — Z90710 Acquired absence of both cervix and uterus: Secondary | ICD-10-CM | POA: Insufficient documentation

## 2016-01-10 DIAGNOSIS — Q261 Persistent left superior vena cava: Secondary | ICD-10-CM | POA: Insufficient documentation

## 2016-01-10 HISTORY — DX: Heart failure, unspecified: I50.9

## 2016-01-10 LAB — CBC WITH DIFFERENTIAL/PLATELET
BASO%: 0.5 % (ref 0.0–2.0)
BASOS ABS: 0 10*3/uL (ref 0.0–0.1)
BASOS PCT: 0 %
Basophils Absolute: 0 10*3/uL (ref 0.0–0.1)
Basophils Absolute: 0 10*3/uL (ref 0.0–0.1)
Basophils Relative: 0 %
EOS ABS: 0.1 10*3/uL (ref 0.0–0.5)
EOS ABS: 0.1 10*3/uL (ref 0.0–0.7)
EOS ABS: 0.1 10*3/uL (ref 0.0–0.7)
EOS%: 1.5 % (ref 0.0–7.0)
Eosinophils Relative: 1 %
Eosinophils Relative: 1 %
HCT: 34.7 % — ABNORMAL LOW (ref 34.8–46.6)
HCT: 33.3 % — ABNORMAL LOW (ref 36.0–46.0)
HEMATOCRIT: 32.5 % — AB (ref 36.0–46.0)
HEMOGLOBIN: 11.2 g/dL — AB (ref 11.6–15.9)
Hemoglobin: 10.6 g/dL — ABNORMAL LOW (ref 12.0–15.0)
Hemoglobin: 10.4 g/dL — ABNORMAL LOW (ref 12.0–15.0)
LYMPH%: 9 % — ABNORMAL LOW (ref 14.0–49.7)
LYMPHS ABS: 0.7 10*3/uL (ref 0.7–4.0)
Lymphocytes Relative: 10 %
Lymphocytes Relative: 10 %
Lymphs Abs: 0.6 10*3/uL — ABNORMAL LOW (ref 0.7–4.0)
MCH: 30.5 pg (ref 25.1–34.0)
MCH: 30.6 pg (ref 26.0–34.0)
MCH: 29.7 pg (ref 26.0–34.0)
MCHC: 32.3 g/dL (ref 31.5–36.0)
MCHC: 31.8 g/dL (ref 30.0–36.0)
MCHC: 32 g/dL (ref 30.0–36.0)
MCV: 94.4 fL (ref 79.5–101.0)
MCV: 96.2 fL (ref 78.0–100.0)
MCV: 92.9 fL (ref 78.0–100.0)
MONO ABS: 0.3 10*3/uL (ref 0.1–1.0)
MONO ABS: 0.3 10*3/uL (ref 0.1–1.0)
MONO#: 0.3 10*3/uL (ref 0.1–0.9)
MONO%: 5.6 % (ref 0.0–14.0)
Monocytes Relative: 6 %
Monocytes Relative: 5 %
NEUT%: 83.4 % — ABNORMAL HIGH (ref 38.4–76.8)
NEUTROS ABS: 4.2 10*3/uL (ref 1.5–6.5)
NEUTROS ABS: 5.8 10*3/uL (ref 1.7–7.7)
NEUTROS PCT: 83 %
Neutro Abs: 4.5 10*3/uL (ref 1.7–7.7)
Neutrophils Relative %: 84 %
PLATELETS: 170 10*3/uL (ref 150–400)
Platelets: 161 10*3/uL (ref 145–400)
Platelets: 171 10*3/uL (ref 150–400)
RBC: 3.68 10*6/uL — AB (ref 3.70–5.45)
RBC: 3.46 MIL/uL — ABNORMAL LOW (ref 3.87–5.11)
RBC: 3.5 MIL/uL — ABNORMAL LOW (ref 3.87–5.11)
RDW: 23.4 % — AB (ref 11.2–14.5)
RDW: 22.3 % — ABNORMAL HIGH (ref 11.5–15.5)
RDW: 21.8 % — AB (ref 11.5–15.5)
WBC: 5 10*3/uL (ref 3.9–10.3)
WBC: 5.5 10*3/uL (ref 4.0–10.5)
WBC: 6.9 10*3/uL (ref 4.0–10.5)
lymph#: 0.5 10*3/uL — ABNORMAL LOW (ref 0.9–3.3)

## 2016-01-10 LAB — COMPREHENSIVE METABOLIC PANEL
ALBUMIN: 3.5 g/dL (ref 3.5–5.0)
ALK PHOS: 56 U/L (ref 40–150)
ALK PHOS: 44 U/L (ref 38–126)
ALT: 19 U/L (ref 0–55)
ALT: 21 U/L (ref 14–54)
ANION GAP: 6 (ref 5–15)
AST: 20 U/L (ref 5–34)
AST: 22 U/L (ref 15–41)
Albumin: 3.8 g/dL (ref 3.5–5.0)
Anion Gap: 9 mEq/L (ref 3–11)
BILIRUBIN TOTAL: 0.37 mg/dL (ref 0.20–1.20)
BILIRUBIN TOTAL: 0.6 mg/dL (ref 0.3–1.2)
BUN: 50.3 mg/dL — AB (ref 7.0–26.0)
BUN: 48 mg/dL — ABNORMAL HIGH (ref 6–20)
CALCIUM: 10.6 mg/dL — AB (ref 8.9–10.3)
CO2: 26 meq/L (ref 22–29)
CO2: 26 mmol/L (ref 22–32)
CREATININE: 0.9 mg/dL (ref 0.6–1.1)
Calcium: 10.2 mg/dL (ref 8.4–10.4)
Chloride: 107 mEq/L (ref 98–109)
Chloride: 109 mmol/L (ref 101–111)
Creatinine, Ser: 0.81 mg/dL (ref 0.44–1.00)
EGFR: 55 mL/min/{1.73_m2} — AB (ref >90)
GLUCOSE: 101 mg/dL (ref 70–140)
GLUCOSE: 99 mg/dL (ref 65–99)
Potassium: 4 mEq/L (ref 3.5–5.1)
Potassium: 3.8 mmol/L (ref 3.5–5.1)
SODIUM: 142 meq/L (ref 136–145)
Sodium: 141 mmol/L (ref 135–145)
TOTAL PROTEIN: 6.2 g/dL — AB (ref 6.4–8.3)
TOTAL PROTEIN: 6.3 g/dL — AB (ref 6.5–8.1)

## 2016-01-10 LAB — PROTIME-INR
INR: 2.6 (ref 2.00–3.50)
INR: 2.02
Prothrombin Time: 23.2 seconds — ABNORMAL HIGH (ref 11.4–15.2)
Protime: 31.2 Seconds — ABNORMAL HIGH (ref 10.6–13.4)

## 2016-01-10 LAB — TYPE AND SCREEN
ABO/RH(D): O POS
ANTIBODY SCREEN: NEGATIVE
Unit division: 0
Unit division: 0

## 2016-01-10 LAB — FERRITIN: Ferritin: 70 ng/ml (ref 9–269)

## 2016-01-10 LAB — I-STAT TROPONIN, ED: TROPONIN I, POC: 0.02 ng/mL (ref 0.00–0.08)

## 2016-01-10 LAB — BRAIN NATRIURETIC PEPTIDE: B Natriuretic Peptide: 353.2 pg/mL — ABNORMAL HIGH (ref 0.0–100.0)

## 2016-01-10 MED ORDER — SODIUM CHLORIDE 0.9% FLUSH
3.0000 mL | Freq: Two times a day (BID) | INTRAVENOUS | Status: DC
  Administered 2016-01-10: 3 mL via INTRAVENOUS

## 2016-01-10 MED ORDER — AMLODIPINE BESYLATE 5 MG PO TABS
5.0000 mg | ORAL_TABLET | Freq: Every day | ORAL | Status: DC
  Administered 2016-01-10: 5 mg via ORAL
  Filled 2016-01-10 (×2): qty 1

## 2016-01-10 MED ORDER — ALLOPURINOL 300 MG PO TABS
300.0000 mg | ORAL_TABLET | Freq: Every day | ORAL | Status: DC
  Administered 2016-01-10: 300 mg via ORAL
  Filled 2016-01-10: qty 1

## 2016-01-10 MED ORDER — INSULIN ASPART 100 UNIT/ML ~~LOC~~ SOLN
0.0000 [IU] | SUBCUTANEOUS | Status: DC
  Administered 2016-01-11: 2 [IU] via SUBCUTANEOUS
  Administered 2016-01-11: 3 [IU] via SUBCUTANEOUS
  Administered 2016-01-11: 1 [IU] via SUBCUTANEOUS

## 2016-01-10 MED ORDER — CLONIDINE HCL 0.1 MG PO TABS
0.1000 mg | ORAL_TABLET | Freq: Two times a day (BID) | ORAL | Status: DC
  Administered 2016-01-10 – 2016-01-11 (×3): 0.1 mg via ORAL
  Filled 2016-01-10 (×3): qty 1

## 2016-01-10 MED ORDER — DICYCLOMINE HCL 10 MG PO CAPS: 10.0000 mg | ORAL_CAPSULE | Freq: Every day | ORAL | Status: DC | PRN

## 2016-01-10 MED ORDER — ACETAMINOPHEN 500 MG PO TABS: 1000.0000 mg | ORAL_TABLET | Freq: Four times a day (QID) | ORAL | Status: DC | PRN

## 2016-01-10 MED ORDER — SODIUM CHLORIDE 0.9% FLUSH: 3.0000 mL | INTRAVENOUS | Status: DC | PRN

## 2016-01-10 MED ORDER — IOPAMIDOL (ISOVUE-370) INJECTION 76%
100.0000 mL | Freq: Once | INTRAVENOUS | Status: AC | PRN
  Administered 2016-01-10: 100 mL via INTRAVENOUS

## 2016-01-10 MED ORDER — DEXAMETHASONE SODIUM PHOSPHATE 4 MG/ML IJ SOLN
4.0000 mg | Freq: Three times a day (TID) | INTRAMUSCULAR | Status: DC
  Administered 2016-01-10 – 2016-01-11 (×2): 4 mg via INTRAVENOUS
  Filled 2016-01-10 (×2): qty 1

## 2016-01-10 MED ORDER — FUROSEMIDE 10 MG/ML IJ SOLN
40.0000 mg | Freq: Once | INTRAMUSCULAR | Status: AC
  Administered 2016-01-10: 40 mg via INTRAVENOUS
  Filled 2016-01-10: qty 4

## 2016-01-10 MED ORDER — ACETAMINOPHEN 500 MG PO TABS
1000.0000 mg | ORAL_TABLET | Freq: Once | ORAL | Status: AC
  Administered 2016-01-10: 1000 mg via ORAL
  Filled 2016-01-10: qty 2

## 2016-01-10 MED ORDER — ONDANSETRON HCL 4 MG/2ML IJ SOLN: 4.0000 mg | Freq: Four times a day (QID) | INTRAMUSCULAR | Status: DC | PRN

## 2016-01-10 MED ORDER — CARVEDILOL 6.25 MG PO TABS
6.2500 mg | ORAL_TABLET | Freq: Two times a day (BID) | ORAL | Status: DC
  Administered 2016-01-10 – 2016-01-11 (×3): 6.25 mg via ORAL
  Filled 2016-01-10 (×4): qty 1

## 2016-01-10 MED ORDER — SODIUM CHLORIDE 0.9 % IV SOLN: 250.0000 mL | INTRAVENOUS | Status: DC | PRN

## 2016-01-10 MED ORDER — SODIUM CHLORIDE 0.9% FLUSH
10.0000 mL | INTRAVENOUS | Status: DC | PRN
  Administered 2016-01-10 – 2016-01-11 (×4): 10 mL
  Filled 2016-01-10 (×4): qty 40

## 2016-01-10 MED ORDER — ZOLPIDEM TARTRATE 5 MG PO TABS
5.0000 mg | ORAL_TABLET | Freq: Every day | ORAL | Status: DC
  Filled 2016-01-10: qty 1

## 2016-01-10 MED ORDER — LEVOTHYROXINE SODIUM 88 MCG PO TABS: 88.0000 ug | ORAL_TABLET | Freq: Every day | ORAL | Status: DC

## 2016-01-10 MED ORDER — IRBESARTAN 75 MG PO TABS
75.0000 mg | ORAL_TABLET | Freq: Every day | ORAL | Status: DC
  Administered 2016-01-10 – 2016-01-11 (×2): 75 mg via ORAL
  Filled 2016-01-10 (×2): qty 1

## 2016-01-10 MED ORDER — ACETAMINOPHEN 325 MG PO TABS
650.0000 mg | ORAL_TABLET | ORAL | Status: DC | PRN
  Administered 2016-01-10: 650 mg via ORAL
  Filled 2016-01-10: qty 2

## 2016-01-10 MED ORDER — NITROGLYCERIN 0.4 MG SL SUBL: 0.4000 mg | SUBLINGUAL_TABLET | SUBLINGUAL | Status: DC | PRN

## 2016-01-10 MED ORDER — LORATADINE 10 MG PO TABS
10.0000 mg | ORAL_TABLET | Freq: Every day | ORAL | Status: DC
  Administered 2016-01-11: 10 mg via ORAL
  Filled 2016-01-10: qty 1

## 2016-01-10 MED ORDER — ENOXAPARIN SODIUM 80 MG/0.8ML ~~LOC~~ SOLN: 1.5000 mg/kg | SUBCUTANEOUS | Status: DC

## 2016-01-10 MED ORDER — CLONIDINE HCL 0.2 MG/24HR TD PTWK: 0.2000 mg | MEDICATED_PATCH | TRANSDERMAL | Status: DC

## 2016-01-10 NOTE — ED Notes (Signed)
FIRST ATTEMPT TO CALL REPORT TO [id number].

## 2016-01-10 NOTE — ED Provider Notes (Signed)
[facility] DEPT Provider Note   CSN: [id number] Arrival date & time: 01/10/16  1126     History   Chief Complaint Chief Complaint  Patient presents with  . Shortness of Breath    HPI Deborah [name] is a 82 y.o. female.  HPI with multiple medical problem here for evaluation of shortness of breath. Patient reports history of persistent GI bleed, A. fib on Coumadin, receives periodic blood transfusions. She reports since receiving a transfusion yesterday, she has become increasingly short of breath. She also reports on Monday she was told to discontinue her Lasix due to renal compromise. She reports associated right-sided chest heaviness that comes and goes over the past 2 days. She also reports increased belching today. Denies any fevers, chills, cough, overt leg swelling, nausea or vomiting, abdominal pain, numbness or weakness or vision changes. No other modifying factors  Past Medical History:  Diagnosis Date  . Anemia   . Arthritis    "fingers, toes, hips; qwhere" (05/19/2013)  . Breast cancer ([place])    "right; S/P lumpectomy, chemo, XRT"   . Chronic lower GI bleeding   . Complication of anesthesia    "once I'm awakened, I don't sleep til the following night"  . GERD (gastroesophageal reflux disease)   . Gout   . Heart murmur   . High cholesterol   . History of blood transfusion 1996; ~ 2013   "related to valve replacement; GI bleeding"   . History of echocardiogram    Echo 1/17: mild LVH, EF 50-55%, no RWMA, mild AI, mechanical AVR with mean 21 mmHg/peak 42 mmHg (stable since 2012), mod MR, mod to severe BAE, PASP 36 mmHg  . History of nuclear stress test    Myoview 1/17: EF 59%, normal perfusion, low risk  . Hypertension   . Hypothyroidism   . Permanent atrial fibrillation ([place])       . Pneumonia 07/2011  . Rectus sheath hematoma 12/19/14    held anticoagulation for 1 week, transfused  . Stomach problems    seeing Dr Watt Climes  . TIA (transient ischemic attack)  12/24/14   symptoms resolved    Patient Active Problem List   Diagnosis Date Noted  . GIB (gastrointestinal bleeding) 12/27/2014  . Cerebral thrombosis with cerebral infarction ([place]) 12/26/2014  . TIA (transient ischemic attack)   . Dysarthria 12/25/2014  . HLD (hyperlipidemia) 12/25/2014  . Essential hypertension 12/25/2014  . Gout 12/25/2014  . GERD (gastroesophageal reflux disease) 12/25/2014  . Absolute anemia   . Rectus sheath hematoma 12/19/2014  . Acute blood loss anemia 12/19/2014  . Hematoma 12/19/2014  . Pain 06/14/2014  . Poor venous access   . Melena 05/19/2013  . Coagulopathy ([place]) 01/27/2013  . Iron deficiency anemia due to chronic blood loss 07/24/2012  . Malaise and fatigue 05/21/2012  . Dyslipidemia 12/04/2011  . Atrial fibrillation ([place]) 09/06/2011  . Long term current use of anticoagulant therapy 08/08/2011  . Nonsustained ventricular tachycardia ([place]) 07/21/2011  . Hypokalemia 07/20/2011  . Hyponatremia 07/20/2011  . Bronchitis 07/20/2011  . S/P aortic valve replacement with metallic valve 82/50/5397  . Hypothyroidism   . Benign hypertensive heart disease without heart failure     Past Surgical History:  Procedure Laterality Date  . ABDOMINAL HYSTERECTOMY    . AORTIC VALVE REPLACEMENT  1996   [employer]  . APPENDECTOMY    . arteriogram  01/12   NO RAS   . BREAST BIOPSY Right   . BREAST LUMPECTOMY Right   .  CARDIAC CATHETERIZATION    . CARDIOVERSION     X 2  . CATARACT EXTRACTION W/ INTRAOCULAR LENS  IMPLANT, BILATERAL Bilateral    bilateral cataract with lens implants  . CHOLECYSTECTOMY    . COLONOSCOPY WITH PROPOFOL N/A 11/24/2012   Procedure: COLONOSCOPY WITH PROPOFOL;  Surgeon: Jeryl Columbia, MD;  Location: [facility] ENDOSCOPY;  Service: Endoscopy;  Laterality: N/A;  . ESOPHAGOGASTRODUODENOSCOPY (EGD) WITH PROPOFOL N/A 07/25/2015   Procedure: ESOPHAGOGASTRODUODENOSCOPY (EGD) WITH PROPOFOL;  Surgeon: Clarene Essex, MD;  Location: [facility] ENDOSCOPY;  Service:  Endoscopy;  Laterality: N/A;  . HOT HEMOSTASIS N/A 07/25/2015   Procedure: HOT HEMOSTASIS (ARGON PLASMA COAGULATION/BICAP);  Surgeon: Clarene Essex, MD;  Location: [facility] ENDOSCOPY;  Service: Endoscopy;  Laterality: N/A;    OB History    No data available       Home Medications    Prior to Admission medications   Medication Sig Start Date End Date Taking? Authorizing Provider  acetaminophen (TYLENOL) 500 MG tablet Take 1,000 mg by mouth every 6 (six) hours as needed for mild pain, moderate pain or headache. Reported on 09/26/2015   Yes Historical Provider, MD  allopurinol (ZYLOPRIM) 300 MG tablet Take 300 mg by mouth at bedtime.    Yes Historical Provider, MD  amLODipine (NORVASC) 5 MG tablet Take 5 mg by mouth at bedtime.   Yes Historical Provider, MD  calcium-vitamin D 500 MG tablet Take 1 tablet by mouth 2 (two) times daily. Reported on 09/26/2015   Yes Historical Provider, MD  carvedilol (COREG) 6.25 MG tablet Take 1 tablet (6.25 mg total) by mouth 2 (two) times daily. 09/26/15  Yes Skeet Latch, MD  celecoxib (CELEBREX) 200 MG capsule Take 200 mg by mouth every other day.  12/07/14  Yes Historical Provider, MD  cloNIDine (CATAPRES) 0.1 MG tablet Take 0.1 mg by mouth 2 (two) times daily.   Yes Historical Provider, MD  dicyclomine (BENTYL) 10 MG capsule Take 10 mg by mouth daily as needed for spasms.    Yes Historical Provider, MD  fexofenadine (ALLEGRA ALLERGY) 180 MG tablet Take 180 mg by mouth daily.    Yes Historical Provider, MD  furosemide (LASIX) 40 MG tablet Take 1-2 tablets daily as directed. 12/28/15  Yes Rhonda G Barrett, PA-C  hydrOXYzine (ATARAX/VISTARIL) 25 MG tablet Take 25 mg by mouth 3 (three) times daily as needed for anxiety.   Yes Historical Provider, MD  levothyroxine (SYNTHROID, LEVOTHROID) 88 MCG tablet Take 1 tablet (88 mcg total) by mouth daily before breakfast. 10/10/15  Yes Skeet Latch, MD  NITROSTAT 0.4 MG SL tablet Place 0.4 mg under the tongue every 5 (five)  minutes as needed for chest pain (x 3 doses). Reported on 10/17/2015 09/12/14  Yes Historical Provider, MD  potassium chloride SA (K-DUR,KLOR-CON) 20 MEQ tablet Take 10 meq (1/2 tab) as directed. 12/28/15  Yes Rhonda G Barrett, PA-C  Probiotic Product (PROBIOTIC ADVANCED PO) Take 1 tablet by mouth daily. Reported on 10/17/2015   Yes Historical Provider, MD  simvastatin (ZOCOR) 20 MG tablet Take 10 mg by mouth daily. TAKE 1/2 TAB   Yes Historical Provider, MD  valsartan (DIOVAN) 320 MG tablet Take 1 tablet (320 mg total) by mouth daily. 09/07/15  Yes Skeet Latch, MD  warfarin (COUMADIN) 5 MG tablet Take 5 mg by mouth daily. Take 2.'5mg'$ -'5mg'$  tablet daily as directed, currently pt is take '5mg'$  on Saturday, Sunday, Tuesday and Thursdays, and 2.'5mg'$  on Monday, Wednesday, and Fridays   Yes Historical Provider, MD  zolpidem [facility]  CR) 12.5 MG CR tablet Take 12.5 mg by mouth at bedtime as needed for sleep.   Yes Historical Provider, MD  cloNIDine (CATAPRES - DOSED IN MG/24 HR) 0.2 mg/24hr patch Place 1 patch (0.2 mg total) onto the skin once a week. Patient not taking: Reported on 01/10/2016 01/02/16   Skeet Latch, MD    Family History Family History  Problem Relation Age of Onset  . Dementia Mother   . Coronary artery disease Father   . Heart attack Father   . Breast cancer Daughter   . Colon cancer Sister   . Liver cancer Sister   . Lung cancer Brother   . COPD Brother     Social History Social History  Substance Use Topics  . Smoking status: Former Smoker    Packs/day: 1.00    Years: 20.00    Types: Cigarettes    Quit date: 04/01/1988  . Smokeless tobacco: Never Used  . Alcohol use 4.8 oz/week    4 Glasses of wine, 4 Shots of liquor per week     Comment: burbon or glass wine daily     Allergies   Amiodarone; Chlorthalidone; Alendronate sodium; Macrodantin [nitrofurantoin]; Meclizine; Norvasc [amlodipine besylate]; Hydralazine hcl; and Tussionex pennkinetic er [employer] polst-cpm  polst er]   Review of Systems Review of Systems A 10 point review of systems was completed and was negative except for pertinent positives and negatives as mentioned in the history of present illness    Physical Exam Updated Vital Signs BP 131/97   Pulse 101   Temp 98.1 F (36.7 C) (Oral)   Resp 23   Ht '5\' 1"'$  (1.549 m)   Wt 54 kg   SpO2 99%   BMI 22.48 kg/m   Physical Exam  Constitutional: She appears well-developed. No distress.  Awake, alert and nontoxic in appearance  HENT:  Head: Normocephalic and atraumatic.  Right Ear: External ear normal.  Left Ear: External ear normal.  Mouth/Throat: Oropharynx is clear and moist.  Eyes: Conjunctivae and EOM are normal. Pupils are equal, round, and reactive to light.  Neck: Normal range of motion. No JVD present.  Cardiovascular: Normal rate, regular rhythm and normal heart sounds.   Pulmonary/Chest: Breath sounds normal. No stridor.  Slight increased work of breathing. No respiratory distress. Mildly diminished breath sounds, but good air movement with no other adventitious lung sounds  Abdominal: Soft. There is no tenderness.  Musculoskeletal: Normal range of motion.  Mild pretibial edema bilaterally.  Neurological:  Awake, alert, cooperative and aware of situation; motor strength bilaterally; sensation normal to light touch bilaterally; no facial asymmetry; tongue midline; major cranial nerves appear intact;  baseline gait without new ataxia.  Skin: No rash noted. She is not diaphoretic.  Psychiatric: She has a normal mood and affect. Her behavior is normal. Thought content normal.  Nursing note and vitals reviewed.    ED Treatments / Results  Labs (all labs ordered are listed, but only abnormal results are displayed) Labs Reviewed  COMPREHENSIVE METABOLIC PANEL - Abnormal; Notable for the following:       Result Value   BUN 48 (*)    Calcium 10.6 (*)    Total Protein 6.3 (*)    All other components within normal  limits  BRAIN NATRIURETIC PEPTIDE - Abnormal; Notable for the following:    B Natriuretic Peptide 353.2 (*)    All other components within normal limits  CBC WITH DIFFERENTIAL/PLATELET - Abnormal; Notable for the following:    RBC  3.46 (*)    Hemoglobin 10.6 (*)    HCT 33.3 (*)    RDW 22.3 (*)    Lymphs Abs 0.6 (*)    All other components within normal limits  PROTIME-INR - Abnormal; Notable for the following:    Prothrombin Time 23.2 (*)    All other components within normal limits  I-STAT TROPOININ, ED    EKG  EKG Interpretation  Date/Time:  Wednesday January 10 2016 12:06:47 EDT Ventricular Rate:  105 PR Interval:    QRS Duration: 97 QT Interval:  298 QTC Calculation: 394 R Axis:   72 Text Interpretation:  Junctional tachycardia Probable anterior infarct, age indeterminate Repol abnrm, severe global ischemia (LM/MVD) no significant change since Sept 27 2017 Confirmed by Regenia Skeeter MD, [place] [phone]) on 01/10/2016 12:11:34 PM Also confirmed by Regenia Skeeter MD, [place] [phone]), editor [place], Joelene Millin [phone])  on 01/10/2016 12:55:07 PM       Radiology Dg Chest 2 View  Result Date: 01/10/2016 CLINICAL DATA:  Worsening shortness of breath since receiving 2 units of blood yesterday. EXAM: CHEST  2 VIEW COMPARISON:  PA and lateral chest 12/24/2014, 05/22/2012 12/27/2015 and 07/13/2015. FINDINGS: The patient is status post right axillary dissection. Port-A-Cath is in place. There is cardiomegaly. Aortic atherosclerosis is identified. There is some coarsening of the pulmonary interstitium which does not appear notably changed since the 2014 examination. Mild blunting of the right costophrenic angle is also unchanged. No pneumothorax. IMPRESSION: Chronic interstitial coarsening appears unchanged since 2014. No acute disease. Electronically Signed   By: Inge Rise M.D.   On: 01/10/2016 12:24   Ct Angio Chest Pe W/cm &/or Wo Cm  Result Date: 01/10/2016 CLINICAL DATA:  Shortness of breath  and chest tightness, history of breast cancer EXAM: CT ANGIOGRAPHY CHEST WITH CONTRAST TECHNIQUE: Multidetector CT imaging of the chest was performed using the standard protocol during bolus administration of intravenous contrast. Multiplanar CT image reconstructions and MIPs were obtained to evaluate the vascular anatomy. CONTRAST:  100 mL Isovue 370 intravenous COMPARISON:  Chest x-ray 12/31/2015 FINDINGS: Cardiovascular: No a definitive filling defect within the main pulmonary arteries. No definite filling defect within the central segmental pulmonary arteries on the left side. There is moderate to severe narrowing of the distal right main pulmonary artery by a mass. Severe narrowing of segmental and subsegmental pulmonary arterial branches in the right upper lobe by a soft tissue mass. Poor filling of the terminal branches of right upper lobe pulmonary artery, suspected to be secondary to decreased flow; no discrete focal filling defects are visualized. Pulmonary arterial trunk is enlarged. There is aneurysmal dilatation of the ascending thoracic aorta, measuring up to 4.7 cm in diameter. Ectasia of the aortic arch, arch diameter is 3.2 cm. Proximal descending thoracic aortic diameter 3.2 cm. No intimal flap is seen. No mediastinal hematoma. Extensive calcifications are present within the aorta and great vessels. Aortic arch is left-sided. 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 There is a central venous catheter present within a persistent left-sided SVC. There is moderate cardiomegaly. No large pericardial effusion. Mediastinum/Nodes: Multiple enlarged, possibly necrotic lymph nodes within the mediastinum. A right pretracheal lymph node measures 2.7 by 2 cm. Inferior to this is an additional enlarged lymph node measuring 2.7 by 2.5 cm. Oval hypodense lesion to the left of the pulmonary artery trunk may reflect additional enlarged  lymph node, this measures 2.6 by 2 cm. Lungs/Pleura: There are bilateral pleural effusions, small  on the left and small to moderate on the right. Emphysematous disease is present bilaterally, most notable in the upper lobes. There is a large right hilar soft tissue mass, this measures 4.2 cm AP by 3.6 cm transverse by 5.2 cm cranial caudad. There is suspected invasion of the mediastinum by the mass or possible contiguity of the mass with mediastinal adenopathy. The mass encases the right-sided pulmonary artery and segmental branches of the right upper lobe. There is plaque-like soft tissue density in the apical portion of the right upper lobe with pleural thickening suspected to represent scarring. There are similar findings in the apical portion of the left upper lobe. Hazy attenuation present within the anterior portion of right upper lobe, possible pneumonitis or post radiation change. Adjacent subpleural fibrosis. Stellate soft tissue focus subpleural right middle lobe, series 11, image number 71, possible scar. Multiple tiny nodular densities are present within the anterior aspect of the left upper lobe. Small 5 mm possible developing nodule in the left lower lobe. Upper Abdomen: Borderline enlarged spleen.  No acute abnormalities. Musculoskeletal: No acute osseous abnormality. No highly suspicious bone lesions. Surgical clips in the right axilla. Review of the MIP images confirms the above findings. IMPRESSION: 1. No definite CT evidence for acute pulmonary embolus, however right upper lobe pulmonary arterial evaluation is slightly limited by the presence of an encasing lung mass. Enlarged pulmonary arterial trunk, correlate clinically for pulmonary hypertension. 2. Large right hilar soft tissue mass with suspected invasion of the mediastinum. The mass encases and narrows the distal right main pulmonary artery and branch vessels of the right upper lobe. Multiple suspicious enlarged mediastinal lymph nodes are  also present. Findings could be secondary to metastatic disease or primary lung carcinoma. 3. Small right greater than left bilateral pleural effusions. Bilateral emphysematous disease. Additional scattered nodular densities in the anterior left upper lobe could relate to infectious or inflammatory process with metastatic disease not excluded. 4. Marked cardiomegaly. Dense atherosclerotic vascular disease of the aorta. 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 5. Aneurysmal dilatation of the ascending aorta. Ascending thoracic aortic aneurysm. Recommend semi-annual imaging followup by CTA or MRA and referral to cardiothoracic surgery if not already obtained. This recommendation follows 2010 ACCF/AHA/AATS/ACR/ASA/SCA/SCAI/SIR/STS/SVM Guidelines for the Diagnosis and Management of Patients With Thoracic Aortic Disease. Circulation. 2010; 121: M546-T035 Electronically Signed   By: Donavan Foil M.D.   On: 01/10/2016 15:58    Procedures Procedures (including critical care time)  Medications Ordered in ED Medications  amLODipine (NORVASC) tablet 5 mg (5 mg Oral Given 01/10/16 1441)  carvedilol (COREG) tablet 6.25 mg (6.25 mg Oral Given 01/10/16 1440)  cloNIDine (CATAPRES) tablet 0.1 mg (0.1 mg Oral Given 01/10/16 1441)  acetaminophen (TYLENOL) tablet 1,000 mg (1,000 mg Oral Given 01/10/16 1535)  iopamidol (ISOVUE-370) 76 % injection 100 mL (100 mLs Intravenous Contrast Given 01/10/16 1513)     Initial Impression / Assessment and Plan / ED Course  I have reviewed the triage vital signs and the nursing notes.  Pertinent labs & imaging results that were available during my care of the patient were reviewed by me and considered in my medical decision making (see chart for details).  Clinical Course  Declines supplement oxygen. States that she thinks she is okay  now. Oxygen saturation is 95% on room air. Patient with shortness of breath, increased breathing since Monday, worse over the past 2 days  after blood transfusion. She does have tachypnea and labored breathing on exam, making oxygen saturations. She does report feeling better with supplemental oxygen. Maintaining saturations 98%. ECG, troponin, chest x-ray, BNP, INR and screening labs are all reassuring. We will obtain CT of chest to ensure no pulmonary embolus given history of cancer. CT shows no evidence of embolus but is concerning for lesion in right upper lobe concerning for metastatic versus primary lung cancer. Discussed with oncology, Dr. Jana Hakim, will coordinate patient care with pulmonology. Discussed ED course, results with patient and daughters at bedside. Patient still with increased work of breathing. Daughters prefer to have patient stay overnight for supplemental oxygen and will return home tomorrow for continued care. Discussed with my attending, Dr. Regenia Skeeter, feel this is reasonable. We will consult hospitalist for admission. Discussed with Dr. Verlon Au, will see in ED. Pt admitted.   Final Clinical Impressions(s) / ED Diagnoses   Final diagnoses:  Shortness of breath  Mass of upper lobe of right lung    New Prescriptions New Prescriptions   No medications on file     Comer Locket, PA-C 01/10/16 [place], MD 01/16/16 1721

## 2016-01-10 NOTE — Progress Notes (Signed)
Patient ID: Deborah [name], female   DOB: 1926-05-01, 82 y.o.   MRN: [id number] Long term patient of mine X 30 yrs. Thank you for excellent care provided.  Multiple family members asked me to stop by to see patient.    No Orthopedic care needed.

## 2016-01-10 NOTE — ED Notes (Signed)
Lab delay -  Pt has port.

## 2016-01-10 NOTE — ED Notes (Signed)
Patient transported to CT 

## 2016-01-10 NOTE — ED Triage Notes (Signed)
PT RECE[facility]ED FROM [facility] FOR SOB AND CHEST TIGHTNESS. PT RECEIVED 2 UNITS OF BLOOD AND LASIX '10MG'$  YESTERDAY, AND SINCE HAS HAD SOB. PT STS HER CARDIOLOGIST TOLD HER TO STOP TAKING HER LASIX DUE TO KIDNEY ISSUES SINCE Monday. PT HAS A HX OF RIGHT BREAST CA, NO LONGER ON CHEMO.

## 2016-01-10 NOTE — Progress Notes (Signed)
ANTICOAGULATION CONSULT NOTE - Initial Consult  Pharmacy Consult for enoxaparin Indication: atrial fibrillation  Allergies  Allergen Reactions  . Amiodarone Shortness Of Breath  . Chlorthalidone     Abdominal bloating and sob  . Alendronate Sodium Other (See Comments)    GI upset  . Macrodantin [Nitrofurantoin] Other (See Comments)    GI upset  . Meclizine Swelling    tongue  . Norvasc [Amlodipine Besylate] Swelling    edema  . Hydralazine Hcl Nausea Only  . Tussionex Pennkinetic Er [Hydrocod Polst-Cpm Polst Er] Other (See Comments)    Reaction unknown    Patient Measurements: Height: 5' (152.4 cm) Weight: 119 lb 14.9 oz (54.4 kg) IBW/kg (Calculated) : 45.5   Vital Signs: Temp: 97.9 F (36.6 C) (10/11 1831) Temp Source: Oral (10/11 1831) BP: 163/97 (10/11 1831) Pulse Rate: 103 (10/11 1831)  Labs:  Recent Labs  01/09/16 1015 01/09/16 1016 01/09/16 1035 01/10/16 1021 01/10/16 1023 01/10/16 1337 01/10/16 1338  HGB 7.9*  --   --  11.2*  --   --  10.6*  HCT 24.7*  --   --  34.7*  --   --  33.3*  PLT 175  --   --  161  --   --  170  LABPROT  --   --   --   --   --  23.2*  --   INR  --   --  3.80* 2.60  --  2.02  --   CREATININE  --  1.2*  --   --  0.9  --  0.81    Estimated Creatinine Clearance: 35.8 mL/min (by C-G formula based on SCr of 0.81 mg/dL).   Medical History: Past Medical History:  Diagnosis Date  . Acute CHF (congestive heart failure) ([place]) 01/10/2016  . Anemia   . Arthritis    "fingers, toes, hips; qwhere" (05/19/2013)  . Breast cancer ([place])    "right; S/P lumpectomy, chemo, XRT"   . Chronic lower GI bleeding   . Complication of anesthesia    "once I'm awakened, I don't sleep til the following night"  . GERD (gastroesophageal reflux disease)   . Gout   . Heart murmur   . High cholesterol   . History of blood transfusion 1996; ~ 2013   "related to valve replacement; GI bleeding"   . History of echocardiogram    Echo 1/17: mild LVH, EF  50-55%, no RWMA, mild AI, mechanical AVR with mean 21 mmHg/peak 42 mmHg (stable since 2012), mod MR, mod to severe BAE, PASP 36 mmHg  . History of nuclear stress test    Myoview 1/17: EF 59%, normal perfusion, low risk  . Hypertension   . Hypothyroidism   . Permanent atrial fibrillation ([place])       . Pneumonia 07/2011  . Rectus sheath hematoma 12/19/14    held anticoagulation for 1 week, transfused  . Stomach problems    seeing Dr Watt Climes  . TIA (transient ischemic attack) 12/24/14   symptoms resolved    Assessment: 82 year old female with hx of afib RVR, chad2Vasc2 5 on coumadin.on presentation INR 3.8, phytonadione '1mg'$  given. Pt with new lung mass, pharmacy consulted to  transition pt. to lovenox to facilitate biopsy.   01/10/2016  INR 2.02 Hgb 10.6 Plts WNL Scr 0.81, CrCl ~ 35.21ms/min  Goal of Therapy:  Anti-Xa level 0.6-1 units/ml 4hrs after LMWH dose given Monitor platelets by anticoagulation protocol   Plan:  Starting 10/12 lovenox 1.'5mg'$ /kg ('80mg'$ ) once  daily Monitor renal function, Hgb, signs of bleeding CBC q 72 hours   Dolly Rias RPh 01/10/2016, 7:34 PM Pager [phone]

## 2016-01-10 NOTE — Consult Note (Signed)
Name: Deborah [name] MRN: [id number] DOB: 09/26/1926    ADMISSION DATE:  01/10/2016 CONSULTATION DATE:  01/10/16  REFERRING MD :  Verlon Au  CHIEF COMPLAINT:  R hilar mass   HISTORY OF PRESENT ILLNESS:  Deborah [name] is a 82 y.o. female with a PMH as outlined below including remote right sided breast CA s/p lumpectomy and chemo / radiation in 1994.  She has been cancer free since.  Has seen Dr. Jana Hakim for anemia and gets PRN blood transfusions (just had 2 units 01/09/16). On 10/11, she was brought to [facility] ED with SOB.  Had CTA that demonstrated right sided lung mass; therefore, PCCM was asked to see.  Pt does report weight loss over the past several months though states that she has been on higher doses of lasix so is not sure whether weight loss is due to this or not.  Is a former smoker, quit some 40 years ago but smoked "a lot" before this. Has had GI bleeding but this is chronic (has been followed by Dr. Watt Climes) and has had APC to AVM's in the past.  PAST MEDICAL HISTORY :   has a past medical history of Acute CHF (congestive heart failure) ([place]) (01/10/2016); Anemia; Arthritis; Breast cancer ([place]); Chronic lower GI bleeding; Complication of anesthesia; GERD (gastroesophageal reflux disease); Gout; Heart murmur; High cholesterol; History of blood transfusion (1996; ~ 2013); History of echocardiogram; History of nuclear stress test; Hypertension; Hypothyroidism; Permanent atrial fibrillation ([place]); Pneumonia (07/2011); Rectus sheath hematoma (12/19/14 ); Stomach problems; and TIA (transient ischemic attack) (12/24/14).  has a past surgical history that includes arteriogram (01/12); Cardioversion; Cardiac catheterization; Cataract extraction w/ intraocular lens  implant, bilateral (Bilateral); Cholecystectomy; Abdominal hysterectomy; Appendectomy; Colonoscopy with propofol (N/A, 11/24/2012); Breast biopsy (Right); Breast lumpectomy (Right); Aortic valve replacement (1996); Esophagogastroduodenoscopy  (egd) with propofol (N/A, 07/25/2015); and Hot hemostasis (N/A, 07/25/2015). Prior to Admission medications   Medication Sig Start Date End Date Taking? Authorizing Provider  acetaminophen (TYLENOL) 500 MG tablet Take 1,000 mg by mouth every 6 (six) hours as needed for mild pain, moderate pain or headache. Reported on 09/26/2015   Yes Historical Provider, MD  allopurinol (ZYLOPRIM) 300 MG tablet Take 300 mg by mouth at bedtime.    Yes Historical Provider, MD  amLODipine (NORVASC) 5 MG tablet Take 5 mg by mouth at bedtime.   Yes Historical Provider, MD  calcium-vitamin D 500 MG tablet Take 1 tablet by mouth 2 (two) times daily. Reported on 09/26/2015   Yes Historical Provider, MD  carvedilol (COREG) 6.25 MG tablet Take 1 tablet (6.25 mg total) by mouth 2 (two) times daily. 09/26/15  Yes Skeet Latch, MD  celecoxib (CELEBREX) 200 MG capsule Take 200 mg by mouth every other day.  12/07/14  Yes Historical Provider, MD  cloNIDine (CATAPRES) 0.1 MG tablet Take 0.1 mg by mouth 2 (two) times daily.   Yes Historical Provider, MD  dicyclomine (BENTYL) 10 MG capsule Take 10 mg by mouth daily as needed for spasms.    Yes Historical Provider, MD  fexofenadine (ALLEGRA ALLERGY) 180 MG tablet Take 180 mg by mouth daily.    Yes Historical Provider, MD  furosemide (LASIX) 40 MG tablet Take 1-2 tablets daily as directed. 12/28/15  Yes Rhonda G Barrett, PA-C  hydrOXYzine (ATARAX/VISTARIL) 25 MG tablet Take 25 mg by mouth 3 (three) times daily as needed for anxiety.   Yes Historical Provider, MD  levothyroxine (SYNTHROID, LEVOTHROID) 88 MCG tablet Take 1 tablet (88 mcg total) by mouth daily  before breakfast. 10/10/15  Yes Skeet Latch, MD  NITROSTAT 0.4 MG SL tablet Place 0.4 mg under the tongue every 5 (five) minutes as needed for chest pain (x 3 doses). Reported on 10/17/2015 09/12/14  Yes Historical Provider, MD  potassium chloride SA (K-DUR,KLOR-CON) 20 MEQ tablet Take 10 meq (1/2 tab) as directed. 12/28/15  Yes Rhonda  G Barrett, PA-C  Probiotic Product (PROBIOTIC ADVANCED PO) Take 1 tablet by mouth daily. Reported on 10/17/2015   Yes Historical Provider, MD  simvastatin (ZOCOR) 20 MG tablet Take 10 mg by mouth daily. TAKE 1/2 TAB   Yes Historical Provider, MD  valsartan (DIOVAN) 320 MG tablet Take 1 tablet (320 mg total) by mouth daily. 09/07/15  Yes Skeet Latch, MD  warfarin (COUMADIN) 5 MG tablet Take 5 mg by mouth daily. Take 2.'5mg'$ -'5mg'$  tablet daily as directed, currently pt is take '5mg'$  on Saturday, Sunday, Tuesday and Thursdays, and 2.'5mg'$  on Monday, Wednesday, and Fridays   Yes Historical Provider, MD  zolpidem (AMBIEN CR) 12.5 MG CR tablet Take 12.5 mg by mouth at bedtime as needed for sleep.   Yes Historical Provider, MD  cloNIDine (CATAPRES - DOSED IN MG/24 HR) 0.2 mg/24hr patch Place 1 patch (0.2 mg total) onto the skin once a week. Patient not taking: Reported on 01/10/2016 01/02/16   Skeet Latch, MD   Allergies  Allergen Reactions  . Amiodarone Shortness Of Breath  . Chlorthalidone     Abdominal bloating and sob  . Alendronate Sodium Other (See Comments)    GI upset  . Macrodantin [Nitrofurantoin] Other (See Comments)    GI upset  . Meclizine Swelling    tongue  . Norvasc [Amlodipine Besylate] Swelling    edema  . Hydralazine Hcl Nausea Only  . Tussionex Pennkinetic Er [Hydrocod Polst-Cpm Polst Er] Other (See Comments)    Reaction unknown    FAMILY HISTORY:  family history includes Breast cancer in her daughter; COPD in her brother; Colon cancer in her sister; Coronary artery disease in her father; Dementia in her mother; Heart attack in her father; Liver cancer in her sister; Lung cancer in her brother. SOCIAL HISTORY:  reports that she quit smoking about 27 years ago. Her smoking use included Cigarettes. She has a 20.00 pack-year smoking history. She has never used smokeless tobacco. She reports that she drinks about 4.8 oz of alcohol per week . She reports that she does not use  drugs.  REVIEW OF SYSTEMS:   All negative; except for those that are bolded, which indicate positives.  Constitutional: weight loss, weight gain, night sweats, fevers, chills, fatigue, weakness.  HEENT: headaches, sore throat, sneezing, nasal congestion, post nasal drip, difficulty swallowing, tooth/dental problems, visual complaints, visual changes, ear aches. Neuro: difficulty with speech, weakness, numbness, ataxia. CV:  chest pain, orthopnea, PND, swelling in lower extremities, dizziness, palpitations, syncope.  Resp: cough, hemoptysis, dyspnea, wheezing. GI: heartburn, indigestion, abdominal pain, nausea, vomiting, diarrhea, constipation, change in bowel habits, loss of appetite, hematemesis, melena, hematochezia.  GU: dysuria, change in color of urine, urgency or frequency, flank pain, hematuria. MSK: joint pain or swelling, decreased range of motion. Psych: change in mood or affect, depression, anxiety, suicidal ideations, homicidal ideations. Skin: rash, itching, bruising.   SUBJECTIVE: Denies chest pain, SOB, productive cough, fevers/chills/sweats.  VITAL SIGNS: Temp:  [97.9 F (36.6 C)-98.1 F (36.7 C)] 97.9 F (36.6 C) (10/11 1831) Pulse Rate:  [69-106] 103 (10/11 1831) Resp:  [16-29] 22 (10/11 1831) BP: (131-175)/(85-97) 163/97 (10/11 1831) SpO2:  [87 %-  100 %] 100 % (10/11 1831) Weight:  [119 lb (54 kg)-119 lb 14.9 oz (54.4 kg)] 119 lb 14.9 oz (54.4 kg) (10/11 1831)  PHYSICAL EXAMINATION: General: Elderly female, chronically ill appearing, resting in bed, in NAD. Neuro: A&O x 3, non-focal.  HEENT: [place]/AT. PERRL, sclerae anicteric. Cardiovascular: IRIR, no M/R/G.  Lungs: Respirations even and unlabored.  CTA bilaterally, No W/R/R.  Diminished right base. Abdomen: BS x 4, soft, NT/ND.  Musculoskeletal: No gross deformities, no edema.  Skin: Intact, warm, no rashes.     Recent Labs Lab 01/09/16 1016 01/10/16 1023 01/10/16 1338  NA 139 142 141  K 4.2 4.0 3.8   CL  --   --  109  CO2 '26 26 26  '$ BUN 69.0* 50.3* 48*  CREATININE 1.2* 0.9 0.81  GLUCOSE 107 101 99    Recent Labs Lab 01/10/16 1021 01/10/16 1338 01/10/16 1920  HGB 11.2* 10.6* 10.4*  HCT 34.7* 33.3* 32.5*  WBC 5.0 5.5 6.9  PLT 161 170 171   Dg Chest 2 View  Result Date: 01/10/2016 CLINICAL DATA:  Worsening shortness of breath since receiving 2 units of blood yesterday. EXAM: CHEST  2 VIEW COMPARISON:  PA and lateral chest 12/24/2014, 05/22/2012 12/27/2015 and 07/13/2015. FINDINGS: The patient is status post right axillary dissection. Port-A-Cath is in place. There is cardiomegaly. Aortic atherosclerosis is identified. There is some coarsening of the pulmonary interstitium which does not appear notably changed since the 2014 examination. Mild blunting of the right costophrenic angle is also unchanged. No pneumothorax. IMPRESSION: Chronic interstitial coarsening appears unchanged since 2014. No acute disease. Electronically Signed   By: Inge Rise M.D.   On: 01/10/2016 12:24   Ct Angio Chest Pe W/cm &/or Wo Cm  Result Date: 01/10/2016 CLINICAL DATA:  Shortness of breath and chest tightness, history of breast cancer EXAM: CT ANGIOGRAPHY CHEST WITH CONTRAST TECHNIQUE: Multidetector CT imaging of the chest was performed using the standard protocol during bolus administration of intravenous contrast. Multiplanar CT image reconstructions and MIPs were obtained to evaluate the vascular anatomy. CONTRAST:  100 mL Isovue 370 intravenous COMPARISON:  Chest x-ray 12/31/2015 FINDINGS: Cardiovascular: No a definitive filling defect within the main pulmonary arteries. No definite filling defect within the central segmental pulmonary arteries on the left side. There is moderate to severe narrowing of the distal right main pulmonary artery by a mass. Severe narrowing of segmental and subsegmental pulmonary arterial branches in the right upper lobe by a soft tissue mass. Poor filling of the terminal  branches of right upper lobe pulmonary artery, suspected to be secondary to decreased flow; no discrete focal filling defects are visualized. Pulmonary arterial trunk is enlarged. There is aneurysmal dilatation of the ascending thoracic aorta, measuring up to 4.7 cm in diameter. Ectasia of the aortic arch, arch diameter is 3.2 cm. Proximal descending thoracic aortic diameter 3.2 cm. No intimal flap is seen. No mediastinal hematoma. Extensive calcifications are present within the aorta and great vessels. Aortic arch is left-sided. 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 There is a central venous catheter present within a persistent left-sided SVC. There is moderate cardiomegaly. No large pericardial effusion. Mediastinum/Nodes: Multiple enlarged, possibly necrotic lymph nodes within the mediastinum. A right pretracheal lymph node measures 2.7 by 2 cm. Inferior to this is an additional enlarged lymph node measuring 2.7 by 2.5 cm. Oval hypodense lesion to the left of the pulmonary artery trunk may reflect additional enlarged lymph  node, this measures 2.6 by 2 cm. Lungs/Pleura: There are bilateral pleural effusions, small on the left and small to moderate on the right. Emphysematous disease is present bilaterally, most notable in the upper lobes. There is a large right hilar soft tissue mass, this measures 4.2 cm AP by 3.6 cm transverse by 5.2 cm cranial caudad. There is suspected invasion of the mediastinum by the mass or possible contiguity of the mass with mediastinal adenopathy. The mass encases the right-sided pulmonary artery and segmental branches of the right upper lobe. There is plaque-like soft tissue density in the apical portion of the right upper lobe with pleural thickening suspected to represent scarring. There are similar findings in the apical portion of the left upper lobe. Hazy attenuation present within the anterior portion  of right upper lobe, possible pneumonitis or post radiation change. Adjacent subpleural fibrosis. Stellate soft tissue focus subpleural right middle lobe, series 11, image number 71, possible scar. Multiple tiny nodular densities are present within the anterior aspect of the left upper lobe. Small 5 mm possible developing nodule in the left lower lobe. Upper Abdomen: Borderline enlarged spleen.  No acute abnormalities. Musculoskeletal: No acute osseous abnormality. No highly suspicious bone lesions. Surgical clips in the right axilla. Review of the MIP images confirms the above findings. IMPRESSION: 1. No definite CT evidence for acute pulmonary embolus, however right upper lobe pulmonary arterial evaluation is slightly limited by the presence of an encasing lung mass. Enlarged pulmonary arterial trunk, correlate clinically for pulmonary hypertension. 2. Large right hilar soft tissue mass with suspected invasion of the mediastinum. The mass encases and narrows the distal right main pulmonary artery and branch vessels of the right upper lobe. Multiple suspicious enlarged mediastinal lymph nodes are also present. Findings could be secondary to metastatic disease or primary lung carcinoma. 3. Small right greater than left bilateral pleural effusions. Bilateral emphysematous disease. Additional scattered nodular densities in the anterior left upper lobe could relate to infectious or inflammatory process with metastatic disease not excluded. 4. Marked cardiomegaly. Dense atherosclerotic vascular disease of the aorta. 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 5. Aneurysmal dilatation of the ascending aorta. Ascending thoracic aortic aneurysm. Recommend semi-annual imaging followup by CTA or MRA and referral to cardiothoracic surgery if not already obtained. This recommendation  follows 2010 ACCF/AHA/AATS/ACR/ASA/SCA/SCAI/SIR/STS/SVM Guidelines for the Diagnosis and Management of Patients With Thoracic Aortic Disease. Circulation. 2010; 121: X902-I097 Electronically Signed   By: Donavan Foil M.D.   On: 01/10/2016 15:58    STUDIES:  CTA Chest 10/11 > no PE. Large right hilar soft tissue mass with suspected invasion of mediastinum. Bilateral effusions R > L.  SIGNIFICANT EVENTS  10/11 > admit.  ASSESSMENT / PLAN:  Right Hilar Mass - almost certainly malignant with unclear primary. Bilateral pleural effusions, R > L - also likely malignant. Acute hypoxic respiratory failure - due to above + concern volume overload (after 2u PRBC on 01/09/16). Plan: Would plan for thoracentesis first, diagnostic and therapeutic (send for routine studies + cytology).  Can potentially do in AM 10/12. If cytology negative, then consider EBUS. Will hold AM dose of enoxaparin until decision on thora made (was changed from warfarin to enoxaparin this admission). May need FFP prior to / following thora. Start decadron '4mg'$  q8hrs with SSI coverage. Oncology follow up.  Rest per primary team.   Montey Hora, PA - [facility] Pager: [phone]  or [phone] 01/10/2016, 9:14 PM   STAFF NOTE: I, Merrie Roof, MD FACP have personally reviewed patient's available data, including medical history, events of note, physical examination and test results as part of my evaluation. I have discussed with resident/NP and other care providers such as pharmacist, RN and RRT. In addition, I personally evaluated patient and elicited key findings of: awake,. Some mild retractions with speech, no sig lymphad neck or axilla, reduced BS anterior rt chest, CT with large mass encasing RUL PA and compressing RUL bronchus with associated lymphad, appears to be primary lung cancer extensive and in a poor location, I am concerned that regardless her outcome will be  poor in a short period of time, the effusion on rt is small to moderate, it is reasonable to [place] her chest in am, if big enough to tap should thora for cytology, if not big enough or neg cytology would proceed to EBUS with Dr Lamonte Sakai if the pt wants to pursue tissue DX, can add low dose steroids for the compressed airway, she may need low dose morphine for sob ( avoid for now), will hold anticoagulation ( aware of valve) for am in case we are to thora in am, if [place] not favorable for thor can re add anticoagulation back in am as EBUS would have to be planned,  Would have oncology opinion on prognosis and utility of tissue dx and treatment options, would favor empiric palliative radiation consideration, will re evaluate her in am for [place] and thora for starters, we need to discuss DNR status further   Lavon Paganini. Titus Mould, MD, [place] Pgr: [facility] 01/10/2016 9:50 PM

## 2016-01-10 NOTE — ED Notes (Signed)
Patient transported to X-ray 

## 2016-01-10 NOTE — ED Notes (Signed)
Bed: WA02 Expected date:  Expected time:  Means of arrival:  Comments: [place]

## 2016-01-10 NOTE — H&P (Signed)
[facility] History and Physical  Deborah [name] CWC:[id number] DOB: 1926-12-08 DOA: 01/10/2016  Referring physician: Callie Fielding PCP: Mathews Argyle, MD  Specialists: none yet  Chief Complaint: SOB  HPI:  82 y/o ? Afib RVR, Chad2Vasc2 5, on coumadin currently-goasl 2.0-2.5 2/2 to hematoma 12/2014 Mechanical AoV repair 1996 Lexiascan 04/2015 neg for ischemia TIA 2016 hypothyroid Chr Diastolic HF-most recent echo 9.12.17 EF55-60%, Mild MR, MOd TR, PASP 42 mm hg Iron def anemia foll Dr. Gust Rung hernia/gastric polyps/diverticular disease-  [upper endoscopy on 07/25/15 that showed a small hiatal hernia.  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 Jana Hakim Had a transfusion 01/03/16 as well, 1 U  Being also managed by Dr. Oval Linsey Cardiology-started HCTZ/Lasix 8.24 Had aechf [?] 9/27 in ED-given IV lasix 60 x 1 and on office f/u started on lasix 40 bid bid which was d/c 01/08/16 as bump in creatinine  States also having some CP, R side, no rad, no diaphoresi No le edmea Better with sitting up , worse with laying back Acutely worsened after transfusion 10/10  In ED Work-up Hb 10.6 BUn/Creat 69/1.2 [10/10]--->48/0.81 bnp 353 POC trop 0.02 Ct chest-R sided new lung mass  No n/v/ Mld R sided cp Has darks tools frequently, none today No dysuria diarr Blurred nor double vision    Review of Systems:   Past Medical History:  Diagnosis Date  . Anemia   . Arthritis    "fingers, toes, hips; qwhere" (05/19/2013)  . Breast cancer ([place])    "right; S/P lumpectomy, chemo, XRT"   . Chronic lower GI bleeding   . Complication of anesthesia    "once I'm  awakened, I don't sleep til the following night"  . GERD (gastroesophageal reflux disease)   . Gout   . Heart murmur   . High cholesterol   . History of blood transfusion 1996; ~ 2013   "related to valve replacement; GI bleeding"   . History of echocardiogram    Echo 1/17: mild LVH, EF 50-55%, no RWMA, mild AI, mechanical AVR with mean 21 mmHg/peak 42 mmHg (stable since 2012), mod MR, mod to severe BAE, PASP 36 mmHg  . History of nuclear stress test    Myoview 1/17: EF 59%, normal perfusion, low risk  . Hypertension   . Hypothyroidism   . Permanent atrial fibrillation ([place])       . Pneumonia 07/2011  . Rectus sheath hematoma 12/19/14    held anticoagulation for 1 week, transfused  . Stomach problems    seeing Dr Watt Climes  . TIA (transient ischemic attack) 12/24/14   symptoms resolved   Past Surgical History:  Procedure Laterality Date  . ABDOMINAL HYSTERECTOMY    . AORTIC VALVE REPLACEMENT  1996   [employer]  . APPENDECTOMY    . arteriogram  01/12   NO RAS   . BREAST BIOPSY Right   . BREAST LUMPECTOMY Right   . CARDIAC CATHETERIZATION    . CARDIOVERSION     X 2  . CATARACT EXTRACTION W/ INTRAOCULAR LENS  IMPLANT, BILATERAL Bilateral    bilateral cataract with lens implants  . CHOLECYSTECTOMY    . COLONOSCOPY WITH PROPOFOL N/A 11/24/2012   Procedure:  COLONOSCOPY WITH PROPOFOL;  Surgeon: Jeryl Columbia, MD;  Location: [facility] ENDOSCOPY;  Service: Endoscopy;  Laterality: N/A;  . ESOPHAGOGASTRODUODENOSCOPY (EGD) WITH PROPOFOL N/A 07/25/2015   Procedure: ESOPHAGOGASTRODUODENOSCOPY (EGD) WITH PROPOFOL;  Surgeon: Clarene Essex, MD;  Location: [facility] ENDOSCOPY;  Service: Endoscopy;  Laterality: N/A;  . HOT HEMOSTASIS N/A 07/25/2015   Procedure: HOT HEMOSTASIS (ARGON PLASMA COAGULATION/BICAP);  Surgeon: Clarene Essex, MD;  Location: [facility] ENDOSCOPY;  Service: Endoscopy;  Laterality: N/A;   Social History:  Social History   Social History Narrative  . No narrative on file    Allergies  Allergen  Reactions  . Amiodarone Shortness Of Breath  . Chlorthalidone     Abdominal bloating and sob  . Alendronate Sodium Other (See Comments)    GI upset  . Macrodantin [Nitrofurantoin] Other (See Comments)    GI upset  . Meclizine Swelling    tongue  . Norvasc [Amlodipine Besylate] Swelling    edema  . Hydralazine Hcl Nausea Only  . Tussionex Pennkinetic Er [Hydrocod Polst-Cpm Polst Er] Other (See Comments)    Reaction unknown    Family History  Problem Relation Age of Onset  . Dementia Mother   . Coronary artery disease Father   . Heart attack Father   . Breast cancer Daughter   . Colon cancer Sister   . Liver cancer Sister   . Lung cancer Brother   . COPD Brother      Prior to Admission medications   Medication Sig Start Date End Date Taking? Authorizing Provider  acetaminophen (TYLENOL) 500 MG tablet Take 1,000 mg by mouth every 6 (six) hours as needed for mild pain, moderate pain or headache. Reported on 09/26/2015   Yes Historical Provider, MD  allopurinol (ZYLOPRIM) 300 MG tablet Take 300 mg by mouth at bedtime.    Yes Historical Provider, MD  amLODipine (NORVASC) 5 MG tablet Take 5 mg by mouth at bedtime.   Yes Historical Provider, MD  calcium-vitamin D 500 MG tablet Take 1 tablet by mouth 2 (two) times daily. Reported on 09/26/2015   Yes Historical Provider, MD  carvedilol (COREG) 6.25 MG tablet Take 1 tablet (6.25 mg total) by mouth 2 (two) times daily. 09/26/15  Yes Skeet Latch, MD  celecoxib (CELEBREX) 200 MG capsule Take 200 mg by mouth every other day.  12/07/14  Yes Historical Provider, MD  cloNIDine (CATAPRES) 0.1 MG tablet Take 0.1 mg by mouth 2 (two) times daily.   Yes Historical Provider, MD  dicyclomine (BENTYL) 10 MG capsule Take 10 mg by mouth daily as needed for spasms.    Yes Historical Provider, MD  fexofenadine (ALLEGRA ALLERGY) 180 MG tablet Take 180 mg by mouth daily.    Yes Historical Provider, MD  furosemide (LASIX) 40 MG tablet Take 1-2 tablets daily  as directed. 12/28/15  Yes Rhonda G Barrett, PA-C  hydrOXYzine (ATARAX/VISTARIL) 25 MG tablet Take 25 mg by mouth 3 (three) times daily as needed for anxiety.   Yes Historical Provider, MD  levothyroxine (SYNTHROID, LEVOTHROID) 88 MCG tablet Take 1 tablet (88 mcg total) by mouth daily before breakfast. 10/10/15  Yes Skeet Latch, MD  NITROSTAT 0.4 MG SL tablet Place 0.4 mg under the tongue every 5 (five) minutes as needed for chest pain (x 3 doses). Reported on 10/17/2015 09/12/14  Yes Historical Provider, MD  potassium chloride SA (K-DUR,KLOR-CON) 20 MEQ tablet Take 10 meq (1/2 tab) as directed. 12/28/15  Yes Rhonda G Barrett, PA-C  Probiotic Product (PROBIOTIC ADVANCED PO) Take 1  tablet by mouth daily. Reported on 10/17/2015   Yes Historical Provider, MD  simvastatin (ZOCOR) 20 MG tablet Take 10 mg by mouth daily. TAKE 1/2 TAB   Yes Historical Provider, MD  valsartan (DIOVAN) 320 MG tablet Take 1 tablet (320 mg total) by mouth daily. 09/07/15  Yes Skeet Latch, MD  warfarin (COUMADIN) 5 MG tablet Take 5 mg by mouth daily. Take 2.'5mg'$ -'5mg'$  tablet daily as directed, currently pt is take '5mg'$  on Saturday, Sunday, Tuesday and Thursdays, and 2.'5mg'$  on Monday, Wednesday, and Fridays   Yes Historical Provider, MD  zolpidem (AMBIEN CR) 12.5 MG CR tablet Take 12.5 mg by mouth at bedtime as needed for sleep.   Yes Historical Provider, MD  cloNIDine (CATAPRES - DOSED IN MG/24 HR) 0.2 mg/24hr patch Place 1 patch (0.2 mg total) onto the skin once a week. Patient not taking: Reported on 01/10/2016 01/02/16   Skeet Latch, MD   Physical Exam: Vitals:   01/10/16 1331 01/10/16 1430 01/10/16 1500 01/10/16 1544  BP: 175/94 174/95 157/94 131/97  Pulse: 105 69 106 101  Resp: 22 16 (!) 29 23  Temp:      TempSrc:      SpO2: 94% (!) 87% 100% 99%  Weight:      Height:       eomi ncat No ict, mild pallor, throat soft supple Mild JVD s1 s 2 loud mech snap No added sound on lung exam abd soft nt nd no rebound nor  gaurding No le edema Moving all 4 limbs equally, smile symmetric, tongue midline, piower 5/5, reflexes deferred vision by direct confrontation wnl     Labs on Admission:  Basic Metabolic Panel:  Recent Labs Lab 01/09/16 1016 01/10/16 1023 01/10/16 1338  NA 139 142 141  K 4.2 4.0 3.8  CL  --   --  109  CO2 '26 26 26  '$ GLUCOSE 107 101 99  BUN 69.0* 50.3* 48*  CREATININE 1.2* 0.9 0.81  CALCIUM 9.6 10.2 10.6*   Liver Function Tests:  Recent Labs Lab 01/09/16 1016 01/10/16 1023 01/10/16 1338  AST '16 20 22  '$ ALT '16 19 21  '$ ALKPHOS 48 56 44  BILITOT <0.30 0.37 0.6  PROT 5.4* 6.2* 6.3*  ALBUMIN 3.1* 3.5 3.8   No results for input(s): LIPASE, AMYLASE in the last 168 hours. No results for input(s): AMMONIA in the last 168 hours. CBC:  Recent Labs Lab 01/09/16 1015 01/10/16 1021 01/10/16 1338  WBC 4.1 5.0 5.5  NEUTROABS 3.4 4.2 4.5  HGB 7.9* 11.2* 10.6*  HCT 24.7* 34.7* 33.3*  MCV 101.1* 94.4 96.2  PLT 175 161 170   Cardiac Enzymes: No results for input(s): CKTOTAL, CKMB, CKMBINDEX, TROPONINI in the last 168 hours.  BNP (last 3 results)  Recent Labs  11/23/15 1522 12/27/15 1221 01/10/16 1338  BNP 331.2* 279.2* 353.2*    ProBNP (last 3 results) No results for input(s): PROBNP in the last 8760 hours.  CBG: No results for input(s): GLUCAP in the last 168 hours.  Radiological Exams on Admission: Dg Chest 2 View  Result Date: 01/10/2016 CLINICAL DATA:  Worsening shortness of breath since receiving 2 units of blood yesterday. EXAM: CHEST  2 VIEW COMPARISON:  PA and lateral chest 12/24/2014, 05/22/2012 12/27/2015 and 07/13/2015. FINDINGS: The patient is status post right axillary dissection. Port-A-Cath is in place. There is cardiomegaly. Aortic atherosclerosis is identified. There is some coarsening of the pulmonary interstitium which does not appear notably changed since the 2014 examination. Mild blunting  of the right costophrenic angle is also unchanged. No  pneumothorax. IMPRESSION: Chronic interstitial coarsening appears unchanged since 2014. No acute disease. Electronically Signed   By: Inge Rise M.D.   On: 01/10/2016 12:24   Ct Angio Chest Pe W/cm &/or Wo Cm  Result Date: 01/10/2016 CLINICAL DATA:  Shortness of breath and chest tightness, history of breast cancer EXAM: CT ANGIOGRAPHY CHEST WITH CONTRAST TECHNIQUE: Multidetector CT imaging of the chest was performed using the standard protocol during bolus administration of intravenous contrast. Multiplanar CT image reconstructions and MIPs were obtained to evaluate the vascular anatomy. CONTRAST:  100 mL Isovue 370 intravenous COMPARISON:  Chest x-ray 12/31/2015 FINDINGS: Cardiovascular: No a definitive filling defect within the main pulmonary arteries. No definite filling defect within the central segmental pulmonary arteries on the left side. There is moderate to severe narrowing of the distal right main pulmonary artery by a mass. Severe narrowing of segmental and subsegmental pulmonary arterial branches in the right upper lobe by a soft tissue mass. Poor filling of the terminal branches of right upper lobe pulmonary artery, suspected to be secondary to decreased flow; no discrete focal filling defects are visualized. Pulmonary arterial trunk is enlarged. There is aneurysmal dilatation of the ascending thoracic aorta, measuring up to 4.7 cm in diameter. Ectasia of the aortic arch, arch diameter is 3.2 cm. Proximal descending thoracic aortic diameter 3.2 cm. No intimal flap is seen. No mediastinal hematoma. Extensive calcifications are present within the aorta and great vessels. Aortic arch is left-sided. 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 There is a central venous catheter present within a persistent left-sided SVC. There is moderate cardiomegaly. No large pericardial effusion. Mediastinum/Nodes: Multiple enlarged,  possibly necrotic lymph nodes within the mediastinum. A right pretracheal lymph node measures 2.7 by 2 cm. Inferior to this is an additional enlarged lymph node measuring 2.7 by 2.5 cm. Oval hypodense lesion to the left of the pulmonary artery trunk may reflect additional enlarged lymph node, this measures 2.6 by 2 cm. Lungs/Pleura: There are bilateral pleural effusions, small on the left and small to moderate on the right. Emphysematous disease is present bilaterally, most notable in the upper lobes. There is a large right hilar soft tissue mass, this measures 4.2 cm AP by 3.6 cm transverse by 5.2 cm cranial caudad. There is suspected invasion of the mediastinum by the mass or possible contiguity of the mass with mediastinal adenopathy. The mass encases the right-sided pulmonary artery and segmental branches of the right upper lobe. There is plaque-like soft tissue density in the apical portion of the right upper lobe with pleural thickening suspected to represent scarring. There are similar findings in the apical portion of the left upper lobe. Hazy attenuation present within the anterior portion of right upper lobe, possible pneumonitis or post radiation change. Adjacent subpleural fibrosis. Stellate soft tissue focus subpleural right middle lobe, series 11, image number 71, possible scar. Multiple tiny nodular densities are present within the anterior aspect of the left upper lobe. Small 5 mm possible developing nodule in the left lower lobe. Upper Abdomen: Borderline enlarged spleen.  No acute abnormalities. Musculoskeletal: No acute osseous abnormality. No highly suspicious bone lesions. Surgical clips in the right axilla. Review of the MIP images confirms the above findings. IMPRESSION: 1. No definite CT evidence for acute pulmonary embolus, however right upper lobe pulmonary arterial evaluation is slightly limited by the presence of an encasing lung mass. Enlarged pulmonary  arterial trunk, correlate  clinically for pulmonary hypertension. 2. Large right hilar soft tissue mass with suspected invasion of the mediastinum. The mass encases and narrows the distal right main pulmonary artery and branch vessels of the right upper lobe. Multiple suspicious enlarged mediastinal lymph nodes are also present. Findings could be secondary to metastatic disease or primary lung carcinoma. 3. Small right greater than left bilateral pleural effusions. Bilateral emphysematous disease. Additional scattered nodular densities in the anterior left upper lobe could relate to infectious or inflammatory process with metastatic disease not excluded. 4. Marked cardiomegaly. Dense atherosclerotic vascular disease of the aorta. 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 5. Aneurysmal dilatation of the ascending aorta. Ascending thoracic aortic aneurysm. Recommend semi-annual imaging followup by CTA or MRA and referral to cardiothoracic surgery if not already obtained. This recommendation follows 2010 ACCF/AHA/AATS/ACR/ASA/SCA/SCAI/SIR/STS/SVM Guidelines for the Diagnosis and Management of Patients With Thoracic Aortic Disease. Circulation. 2010; 121: D322-G254 Electronically Signed   By: Donavan Foil M.D.   On: 01/10/2016 15:58    EKG: Independently reviewed. Tachycardia with ? Junctional features Some T wave inversions which atre not new  Assessment/Plan  Acute Hypoxic resp failure -probably related to iatrogenic blood/volume -lasix 40 IV x 1 -re-eval weight/output and labs am [basleine wght 118] -Desat screen -probably might need oxygen on d/c and low dose lasix  AKI -2/2 to lasix use increase -need to be cautious regarding the same  New Lung Mass -Oncology/Pulmonology input appreciated in advance -might benefit ? transition to lovenox on d/c home to facilitate biopsy etc  etc  Afib RVR Chad2 ~4 -Coreg 6.25 bid given in ED -transition lovenox  Htn -cont clonidine 0.1 2 patch, substitute Avapro 75 for home ARB -cont Amlodipine 5 hs  Gout -cont allopurinol 300 hs -hold celcoxib  Chr Blood loss anemai 2/2 to GI issues -given transfusion 10/11 -also given Vit K at time of transfusion -monitor am hb  Holding calcium, zocor, probiotic   DNAR D/w daughter at bedside obs admission expected d/c 01/11/16 if all stable   Verlon [facility] Pager [phone]  If 7PM-7AM, please contact night-coverage [email or web address] Password [facility] 01/10/2016, 4:37 PM

## 2016-01-11 ENCOUNTER — Observation Stay (HOSPITAL_COMMUNITY): Payer: Medicare Other

## 2016-01-11 ENCOUNTER — Telehealth: Payer: Self-pay | Admitting: Cardiovascular Disease

## 2016-01-11 ENCOUNTER — Other Ambulatory Visit: Payer: Self-pay | Admitting: *Deleted

## 2016-01-11 DIAGNOSIS — J90 Pleural effusion, not elsewhere classified: Secondary | ICD-10-CM | POA: Diagnosis not present

## 2016-01-11 DIAGNOSIS — D500 Iron deficiency anemia secondary to blood loss (chronic): Secondary | ICD-10-CM | POA: Diagnosis not present

## 2016-01-11 DIAGNOSIS — K922 Gastrointestinal hemorrhage, unspecified: Secondary | ICD-10-CM | POA: Diagnosis not present

## 2016-01-11 DIAGNOSIS — Q2730 Arteriovenous malformation, site unspecified: Secondary | ICD-10-CM | POA: Diagnosis not present

## 2016-01-11 DIAGNOSIS — K649 Unspecified hemorrhoids: Secondary | ICD-10-CM

## 2016-01-11 DIAGNOSIS — I10 Essential (primary) hypertension: Secondary | ICD-10-CM

## 2016-01-11 DIAGNOSIS — Z952 Presence of prosthetic heart valve: Secondary | ICD-10-CM

## 2016-01-11 DIAGNOSIS — Z9889 Other specified postprocedural states: Secondary | ICD-10-CM | POA: Diagnosis not present

## 2016-01-11 DIAGNOSIS — Z87891 Personal history of nicotine dependence: Secondary | ICD-10-CM

## 2016-01-11 DIAGNOSIS — Z853 Personal history of malignant neoplasm of breast: Secondary | ICD-10-CM

## 2016-01-11 DIAGNOSIS — I482 Chronic atrial fibrillation: Secondary | ICD-10-CM | POA: Diagnosis not present

## 2016-01-11 DIAGNOSIS — R918 Other nonspecific abnormal finding of lung field: Secondary | ICD-10-CM | POA: Diagnosis not present

## 2016-01-11 DIAGNOSIS — I4891 Unspecified atrial fibrillation: Secondary | ICD-10-CM

## 2016-01-11 DIAGNOSIS — J9601 Acute respiratory failure with hypoxia: Secondary | ICD-10-CM | POA: Diagnosis not present

## 2016-01-11 DIAGNOSIS — K317 Polyp of stomach and duodenum: Secondary | ICD-10-CM

## 2016-01-11 DIAGNOSIS — K5790 Diverticulosis of intestine, part unspecified, without perforation or abscess without bleeding: Secondary | ICD-10-CM

## 2016-01-11 DIAGNOSIS — Z7901 Long term (current) use of anticoagulants: Secondary | ICD-10-CM

## 2016-01-11 LAB — BODY FLUID CELL COUNT WITH DIFFERENTIAL
Lymphs, Fluid: 71 %
MONOCYTE-MACROPHAGE-SEROUS FLUID: 29 % — AB (ref 50–90)
WBC FLUID: 688 uL (ref 0–1000)

## 2016-01-11 LAB — BASIC METABOLIC PANEL
Anion gap: 7 (ref 5–15)
BUN: 40 mg/dL — ABNORMAL HIGH (ref 6–20)
CHLORIDE: 105 mmol/L (ref 101–111)
CO2: 28 mmol/L (ref 22–32)
Calcium: 9.6 mg/dL (ref 8.9–10.3)
Creatinine, Ser: 0.85 mg/dL (ref 0.44–1.00)
GFR calc non Af Amer: >60 mL/min (ref >60)
Glucose, Bld: 140 mg/dL — ABNORMAL HIGH (ref 65–99)
POTASSIUM: 4 mmol/L (ref 3.5–5.1)
SODIUM: 140 mmol/L (ref 135–145)

## 2016-01-11 LAB — PROTIME-INR
INR: 1.49
Prothrombin Time: 18.2 seconds — ABNORMAL HIGH (ref 11.4–15.2)

## 2016-01-11 LAB — PROTEIN, BODY FLUID: Total protein, fluid: <3 g/dL

## 2016-01-11 LAB — GLUCOSE, CAPILLARY
GLUCOSE-CAPILLARY: 117 mg/dL — AB (ref 65–99)
Glucose-Capillary: 155 mg/dL — ABNORMAL HIGH (ref 65–99)
Glucose-Capillary: 135 mg/dL — ABNORMAL HIGH (ref 65–99)
Glucose-Capillary: 210 mg/dL — ABNORMAL HIGH (ref 65–99)

## 2016-01-11 LAB — LACTATE DEHYDROGENASE, PLEURAL OR PERITONEAL FLUID: LD FL: 69 U/L — AB (ref 3–23)

## 2016-01-11 LAB — GLUCOSE, SEROUS FLUID: GLUCOSE FL: 149 mg/dL

## 2016-01-11 MED ORDER — ENOXAPARIN (LOVENOX) PATIENT EDUCATION KIT
PACK | Freq: Once | Status: AC
Start: 2016-01-11 — End: 2016-01-11
  Administered 2016-01-11: 14:00:00
  Filled 2016-01-11: qty 1

## 2016-01-11 MED ORDER — KETOROLAC TROMETHAMINE 10 MG PO TABS
15.0000 mg | ORAL_TABLET | Freq: Once | ORAL | Status: AC
  Administered 2016-01-11: 15 mg via ORAL
  Filled 2016-01-11: qty 2

## 2016-01-11 MED ORDER — ENOXAPARIN SODIUM 60 MG/0.6ML ~~LOC~~ SOLN: 50.0000 mg | Freq: Two times a day (BID) | SUBCUTANEOUS | Status: DC

## 2016-01-11 MED ORDER — HEPARIN SOD (PORK) LOCK FLUSH 100 UNIT/ML IV SOLN
500.0000 [IU] | INTRAVENOUS | Status: DC | PRN
  Administered 2016-01-11: 500 [IU]
  Filled 2016-01-11 (×2): qty 5

## 2016-01-11 MED ORDER — DEXAMETHASONE 4 MG PO TABS: 4.0000 mg | ORAL_TABLET | Freq: Two times a day (BID) | ORAL | 0 refills | Status: DC

## 2016-01-11 MED ORDER — ENOXAPARIN SODIUM 40 MG/0.4ML ~~LOC~~ SOLN
50.0000 mg | Freq: Two times a day (BID) | SUBCUTANEOUS | 0 refills | Status: DC
Start: 2016-01-11 — End: 2016-01-11

## 2016-01-11 MED ORDER — HEPARIN SOD (PORK) LOCK FLUSH 100 UNIT/ML IV SOLN
500.0000 [IU] | INTRAVENOUS | Status: DC
  Filled 2016-01-11: qty 5

## 2016-01-11 MED ORDER — ENOXAPARIN SODIUM 60 MG/0.6ML ~~LOC~~ SOLN: 1.0000 mg/kg | Freq: Two times a day (BID) | SUBCUTANEOUS | 0 refills | Status: DC

## 2016-01-11 MED ORDER — ENOXAPARIN SODIUM 60 MG/0.6ML ~~LOC~~ SOLN
50.0000 mg | Freq: Once | SUBCUTANEOUS | Status: AC
  Administered 2016-01-11: 50 mg via SUBCUTANEOUS
  Filled 2016-01-11: qty 0.6

## 2016-01-11 MED ORDER — HYDROXYZINE HCL 25 MG PO TABS
25.0000 mg | ORAL_TABLET | Freq: Two times a day (BID) | ORAL | Status: DC | PRN
  Administered 2016-01-11: 25 mg via ORAL
  Filled 2016-01-11: qty 1

## 2016-01-11 MED ORDER — DEXAMETHASONE SODIUM PHOSPHATE 4 MG/ML IJ SOLN: 4.0000 mg | Freq: Three times a day (TID) | INTRAMUSCULAR | 0 refills | Status: DC

## 2016-01-11 NOTE — Progress Notes (Signed)
SATURATION QUALIFICATIONS: (This note is used to comply with regulatory documentation for home oxygen)  Patient Saturations on Room Air at Rest =86%   

## 2016-01-11 NOTE — Discharge Summary (Addendum)
Physician Discharge Summary  Deborah [name] VPX:[id number] DOB: March 25, 1927 DOA: 01/10/2016  PCP: Mathews Argyle, MD  Admit date: 01/10/2016 Discharge date: 01/11/2016  Admitted From: Home Disposition:  Home  Recommendations for Outpatient Follow-up:  1. Follow up with Pulmonary in 1-2 weeks. 2. Please obtain CBC in one week.  Home Health: no Equipment/Devices: None  Discharge Condition:stable CODE STATUS:full Diet recommendation: Heart Healthy  Brief/Interim Summary: 82 year old with past medical history of tobacco abuse, right breast cancer in 1994 status post radiation and chemotherapy in remission since then with a mechanical aortic valve on Coumadin, with a history of chronic occult GI bleeds due to AVMs final by Dr. Jinny Blossom is an outpatient comes to the [facility] long ED complaining of dyspnea. CTA was negative for PE but showed right sided lung mass.  Discharge Diagnoses:  Active Problems:   Chronic atrial fibrillation (HCC)   Normocytic anemia due to blood loss   Essential hypertension   Chronic GI bleeding   Shortness of breath   Acute CHF (congestive heart failure) (HCC)   Mass of upper lobe of right lung   Bilateral pleural effusion   AVM (arteriovenous malformation)   Acute respiratory failure with hypoxia (HCC)   S/P thoracentesis  Acute respiratory failure with hypoxia due to bilateral pleural effusion: There is most likely a malignancy due to her tobacco abuse and age. Pulmonary and critical care was consulted and ultrasound-guided thoracocentesis and cytology was performed on 01/11/2016. postprocedural chest x-ray did not show pneumothorax She had Significant improvement in her dyspnea. Cytology results are pending and will be follow-up by Dr. Junie Spencer who was consulted and recommended to send home on Lovenox twice a day.  New Mass of upper lobe of right lung: Oncology and pulmonary have been consulted and they have proceeded with thoracocentesis, if  thoracocentesis negative will proceed with ultrasound biopsy as an out patient by pulmonary. Low-dose steroids was added which she will continue as an outpatient, she'll go home on Lovenox.    Chronic atrial fibrillation ([place]) [place] Vasc 4. Continue Lovenox and outpatient. Rate controlled  Normocytic anemia due to blood loss 2/2 AVM (arteriovenous malformation)  Essential hypertension: Continue clonidine and Avapro and amlodipine. Blood pressure stable.  Discharge Instructions  Discharge Instructions    Diet - low sodium heart healthy    Complete by:  As directed    Increase activity slowly    Complete by:  As directed        Medication List    STOP taking these medications   warfarin 5 MG tablet Commonly known as:  COUMADIN     TAKE these medications   acetaminophen 500 MG tablet Commonly known as:  TYLENOL Take 1,000 mg by mouth every 6 (six) hours as needed for mild pain, moderate pain or headache. Reported on 09/26/2015   [facility] ALLERGY 180 MG tablet Generic drug:  fexofenadine Take 180 mg by mouth daily.   allopurinol 300 MG tablet Commonly known as:  ZYLOPRIM Take 300 mg by mouth at bedtime.   amLODipine 5 MG tablet Commonly known as:  NORVASC Take 5 mg by mouth at bedtime.   BENTYL 10 MG capsule Generic drug:  dicyclomine Take 10 mg by mouth daily as needed for spasms.   calcium-vitamin D 500 MG tablet Take 1 tablet by mouth 2 (two) times daily. Reported on 09/26/2015   carvedilol 6.25 MG tablet Commonly known as:  COREG Take 1 tablet (6.25 mg total) by mouth 2 (two) times daily.   celecoxib 200  MG capsule Commonly known as:  CELEBREX Take 200 mg by mouth every other day.   cloNIDine 0.1 MG tablet Commonly known as:  CATAPRES Take 0.1 mg by mouth 2 (two) times daily.   cloNIDine 0.2 mg/24hr patch Commonly known as:  CATAPRES - Dosed in mg/24 hr Place 1 patch (0.2 mg total) onto the skin once a week.   dexamethasone 4 MG tablet Commonly  known as:  DECADRON Take 1 tablet (4 mg total) by mouth 2 (two) times daily with a meal.   enoxaparin 60 MG/0.6ML injection Commonly known as:  LOVENOX Inject 0.5 mLs (50 mg total) into the skin every 12 (twelve) hours.   furosemide 40 MG tablet Commonly known as:  LASIX Take 1-2 tablets daily as directed.   hydrOXYzine 25 MG tablet Commonly known as:  ATARAX/VISTARIL Take 25 mg by mouth 3 (three) times daily as needed for anxiety.   levothyroxine 88 MCG tablet Commonly known as:  SYNTHROID, LEVOTHROID Take 1 tablet (88 mcg total) by mouth daily before breakfast.   NITROSTAT 0.4 MG SL tablet Generic drug:  nitroGLYCERIN Place 0.4 mg under the tongue every 5 (five) minutes as needed for chest pain (x 3 doses). Reported on 10/17/2015   potassium chloride SA 20 MEQ tablet Commonly known as:  K-DUR,KLOR-CON Take 10 meq (1/2 tab) as directed.   PROBIOTIC ADVANCED PO Take 1 tablet by mouth daily. Reported on 10/17/2015   simvastatin 20 MG tablet Commonly known as:  ZOCOR Take 10 mg by mouth daily. TAKE 1/2 TAB   valsartan 320 MG tablet Commonly known as:  DIOVAN Take 1 tablet (320 mg total) by mouth daily.   zolpidem 12.5 MG CR tablet Commonly known as:  AMBIEN CR Take 12.5 mg by mouth at bedtime as needed for sleep.       Allergies  Allergen Reactions  . Amiodarone Shortness Of Breath  . Chlorthalidone     Abdominal bloating and sob  . Alendronate Sodium Other (See Comments)    GI upset  . Macrodantin [Nitrofurantoin] Other (See Comments)    GI upset  . Meclizine Swelling    tongue  . Norvasc [Amlodipine Besylate] Swelling    edema  . Hydralazine Hcl Nausea Only  . Tussionex Pennkinetic Er [Hydrocod Polst-Cpm Polst Er] Other (See Comments)    Reaction unknown    Consultations:  Oncology  Pulmonary and critical care   Procedures/Studies: Dg Chest 2 View  Result Date: 01/10/2016 CLINICAL DATA:  Worsening shortness of breath since receiving 2 units of  blood yesterday. EXAM: CHEST  2 VIEW COMPARISON:  PA and lateral chest 12/24/2014, 05/22/2012 12/27/2015 and 07/13/2015. FINDINGS: The patient is status post right axillary dissection. Port-A-Cath is in place. There is cardiomegaly. Aortic atherosclerosis is identified. There is some coarsening of the pulmonary interstitium which does not appear notably changed since the 2014 examination. Mild blunting of the right costophrenic angle is also unchanged. No pneumothorax. IMPRESSION: Chronic interstitial coarsening appears unchanged since 2014. No acute disease. Electronically Signed   By: Inge Rise M.D.   On: 01/10/2016 12:24   Dg Chest 2 View  Result Date: 12/27/2015 CLINICAL DATA:  Maleate with fatigue and shortness of breath for 10 days. EXAM: CHEST  2 VIEW COMPARISON:  07/13/2015. FINDINGS: Trachea is midline. Heart is enlarged. Thoracic aorta is calcified and somewhat prominent. Left IJ power port tip appears to be within a left-sided SVC. Mild basilar dependent interstitial prominence with possible peripheral septal lines at the costophrenic angles. Tiny  right pleural effusion. Surgical clips in the right axilla. IMPRESSION: Favor congestive heart failure. Electronically Signed   By: Lorin Picket M.D.   On: 12/27/2015 11:06   Ct Angio Chest Pe W/cm &/or Wo Cm  Result Date: 01/10/2016 CLINICAL DATA:  Shortness of breath and chest tightness, history of breast cancer EXAM: CT ANGIOGRAPHY CHEST WITH CONTRAST TECHNIQUE: Multidetector CT imaging of the chest was performed using the standard protocol during bolus administration of intravenous contrast. Multiplanar CT image reconstructions and MIPs were obtained to evaluate the vascular anatomy. CONTRAST:  100 mL Isovue 370 intravenous COMPARISON:  Chest x-ray 12/31/2015 FINDINGS: Cardiovascular: No a definitive filling defect within the main pulmonary arteries. No definite filling defect within the central segmental pulmonary arteries on the left  side. There is moderate to severe narrowing of the distal right main pulmonary artery by a mass. Severe narrowing of segmental and subsegmental pulmonary arterial branches in the right upper lobe by a soft tissue mass. Poor filling of the terminal branches of right upper lobe pulmonary artery, suspected to be secondary to decreased flow; no discrete focal filling defects are visualized. Pulmonary arterial trunk is enlarged. There is aneurysmal dilatation of the ascending thoracic aorta, measuring up to 4.7 cm in diameter. Ectasia of the aortic arch, arch diameter is 3.2 cm. Proximal descending thoracic aortic diameter 3.2 cm. No intimal flap is seen. No mediastinal hematoma. Extensive calcifications are present within the aorta and great vessels. Aortic arch is left-sided. 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 There is a central venous catheter present within a persistent left-sided SVC. There is moderate cardiomegaly. No large pericardial effusion. Mediastinum/Nodes: Multiple enlarged, possibly necrotic lymph nodes within the mediastinum. A right pretracheal lymph node measures 2.7 by 2 cm. Inferior to this is an additional enlarged lymph node measuring 2.7 by 2.5 cm. Oval hypodense lesion to the left of the pulmonary artery trunk may reflect additional enlarged lymph node, this measures 2.6 by 2 cm. Lungs/Pleura: There are bilateral pleural effusions, small on the left and small to moderate on the right. Emphysematous disease is present bilaterally, most notable in the upper lobes. There is a large right hilar soft tissue mass, this measures 4.2 cm AP by 3.6 cm transverse by 5.2 cm cranial caudad. There is suspected invasion of the mediastinum by the mass or possible contiguity of the mass with mediastinal adenopathy. The mass encases the right-sided pulmonary artery and segmental branches of the right upper lobe. There is plaque-like  soft tissue density in the apical portion of the right upper lobe with pleural thickening suspected to represent scarring. There are similar findings in the apical portion of the left upper lobe. Hazy attenuation present within the anterior portion of right upper lobe, possible pneumonitis or post radiation change. Adjacent subpleural fibrosis. Stellate soft tissue focus subpleural right middle lobe, series 11, image number 71, possible scar. Multiple tiny nodular densities are present within the anterior aspect of the left upper lobe. Small 5 mm possible developing nodule in the left lower lobe. Upper Abdomen: Borderline enlarged spleen.  No acute abnormalities. Musculoskeletal: No acute osseous abnormality. No highly suspicious bone lesions. Surgical clips in the right axilla. Review of the MIP images confirms the above findings. IMPRESSION: 1. No definite CT evidence for acute pulmonary embolus, however right upper lobe pulmonary arterial evaluation is slightly limited by the presence of an encasing lung mass. Enlarged pulmonary arterial trunk, correlate clinically for pulmonary hypertension.  2. Large right hilar soft tissue mass with suspected invasion of the mediastinum. The mass encases and narrows the distal right main pulmonary artery and branch vessels of the right upper lobe. Multiple suspicious enlarged mediastinal lymph nodes are also present. Findings could be secondary to metastatic disease or primary lung carcinoma. 3. Small right greater than left bilateral pleural effusions. Bilateral emphysematous disease. Additional scattered nodular densities in the anterior left upper lobe could relate to infectious or inflammatory process with metastatic disease not excluded. 4. Marked cardiomegaly. Dense atherosclerotic vascular disease of the aorta. 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 Persistent left-sided SVC containing a  central venous catheter. 5. Aneurysmal dilatation of the ascending aorta. Ascending thoracic aortic aneurysm. Recommend semi-annual imaging followup by CTA or MRA and referral to cardiothoracic surgery if not already obtained. This recommendation follows 2010 ACCF/AHA/AATS/ACR/ASA/SCA/SCAI/SIR/STS/SVM Guidelines for the Diagnosis and Management of Patients With Thoracic Aortic Disease. Circulation. 2010; 121: J497-W263 Electronically Signed   By: Donavan Foil M.D.   On: 01/10/2016 15:58   Dg Chest Port 1 View  Result Date: 01/11/2016 CLINICAL DATA:  82 year old female with history of right-sided hilar mass and right pleural effusion status post right-sided thoracentesis. EXAM: PORTABLE CHEST 1 VIEW COMPARISON:  Chest x-ray 01/10/2016. FINDINGS: Small right pleural effusion. No appreciable pneumothorax. No acute consolidative airspace disease. No evidence of pulmonary edema. Mild cardiomegaly. The patient is rotated to the right on today's exam, resulting in distortion of the mediastinal contours and reduced diagnostic sensitivity and specificity for mediastinal pathology. Fullness in the right hilar region compatible with known right hilar mass. Atherosclerosis in the thoracic aorta. Left-sided internal jugular single-lumen porta cath with tip terminating in the patient's persistent left superior vena cava. Surgical clips in the right axilla, presumably from prior axillary nodal dissection. IMPRESSION: 1. Small residual right-sided pleural effusion following thoracentesis. No appreciable pneumothorax. 2. Otherwise, the radiographic appearance the chest is essentially unchanged, as above. Electronically Signed   By: Vinnie Langton M.D.   On: 01/11/2016 11:56      Subjective: She relates her breathing is much than yesterday.  Discharge Exam: Vitals:   01/10/16 2123 01/11/16 0403  BP: 120/69 126/65  Pulse: (!) 105 (!) 105  Resp: 20 20  Temp: 99.1 F (37.3 C) 98.4 F (36.9 C)   Vitals:    01/10/16 1815 01/10/16 1831 01/10/16 2123 01/11/16 0403  BP:  (!) 163/97 120/69 126/65  Pulse:  (!) 103 (!) 105 (!) 105  Resp:  (!) '22 20 20  '$ Temp: 98 F (36.7 C) 97.9 F (36.6 C) 99.1 F (37.3 C) 98.4 F (36.9 C)  TempSrc: Oral Oral Oral Oral  SpO2: 100% 100% 96% 98%  Weight:  54.4 kg (119 lb 14.9 oz)  52.1 kg (114 lb 13.8 oz)  Height:  5' (1.524 m)      General: Pt is alert, awake, not in acute distress Cardiovascular: RRR, S1/S2 +, no rubs, no gallops Respiratory: CTA bilaterally, no wheezing, no rhonchi Abdominal: Soft, NT, ND, bowel sounds + Extremities: no edema, no cyanosis    The results of significant diagnostics from this hospitalization (including imaging, microbiology, ancillary and laboratory) are listed below for reference.     Microbiology: No results found for this or any previous visit (from the past 240 hour(s)).   Labs: BNP (last 3 results)  Recent Labs  11/23/15 1522 12/27/15 1221 01/10/16 1338  BNP 331.2* 279.2* 785.8*   Basic Metabolic Panel:  Recent Labs Lab 01/09/16 1016 01/10/16 1023 01/10/16 1338 01/11/16 0410  NA 139 142 141 140  K 4.2 4.0 3.8 4.0  CL  --   --  109 105  CO2 '26 26 26 28  '$ GLUCOSE 107 101 99 140*  BUN 69.0* 50.3* 48* 40*  CREATININE 1.2* 0.9 0.81 0.85  CALCIUM 9.6 10.2 10.6* 9.6   Liver Function Tests:  Recent Labs Lab 01/09/16 1016 01/10/16 1023 01/10/16 1338  AST '16 20 22  '$ ALT '16 19 21  '$ ALKPHOS 48 56 44  BILITOT <0.30 0.37 0.6  PROT 5.4* 6.2* 6.3*  ALBUMIN 3.1* 3.5 3.8   No results for input(s): LIPASE, AMYLASE in the last 168 hours. No results for input(s): AMMONIA in the last 168 hours. CBC:  Recent Labs Lab 01/09/16 1015 01/10/16 1021 01/10/16 1338 01/10/16 1920  WBC 4.1 5.0 5.5 6.9  NEUTROABS 3.4 4.2 4.5 5.8  HGB 7.9* 11.2* 10.6* 10.4*  HCT 24.7* 34.7* 33.3* 32.5*  MCV 101.1* 94.4 96.2 92.9  PLT 175 161 170 171   Cardiac Enzymes: No results for input(s): CKTOTAL, CKMB, CKMBINDEX,  TROPONINI in the last 168 hours. BNP: Invalid input(s): POCBNP CBG:  Recent Labs Lab 01/11/16 0020 01/11/16 0407 01/11/16 0809  GLUCAP 117* 155* 135*   D-Dimer No results for input(s): DDIMER in the last 72 hours. Hgb A1c No results for input(s): HGBA1C in the last 72 hours. Lipid Profile No results for input(s): CHOL, HDL, LDLCALC, TRIG, CHOLHDL, LDLDIRECT in the last 72 hours. Thyroid function studies No results for input(s): TSH, T4TOTAL, T3FREE, THYROIDAB in the last 72 hours.  Invalid input(s): FREET3 Anemia work up  Recent Labs  01/09/16 1015 01/10/16 1021  FERRITIN 60 70   Urinalysis    Component Value Date/Time   COLORURINE YELLOW 12/27/2015 1254   APPEARANCEUR CLEAR 12/27/2015 1254   LABSPEC 1.007 12/27/2015 1254   LABSPEC 1.010 03/22/2015 0917   PHURINE 6.5 12/27/2015 1254   GLUCOSEU NEGATIVE 12/27/2015 1254   GLUCOSEU Negative 03/22/2015 0917   HGBUR NEGATIVE 12/27/2015 1254   BILIRUBINUR NEGATIVE 12/27/2015 1254   BILIRUBINUR Negative 03/22/2015 [place] 12/27/2015 1254   PROTEINUR NEGATIVE 12/27/2015 1254   UROBILINOGEN 0.2 03/22/2015 0917   NITRITE NEGATIVE 12/27/2015 1254   LEUKOCYTESUR NEGATIVE 12/27/2015 1254   LEUKOCYTESUR Negative 03/22/2015 0917   Sepsis Labs Invalid input(s): PROCALCITONIN,  WBC,  LACTICIDVEN Microbiology No results found for this or any previous visit (from the past 240 hour(s)).   Time coordinating discharge: Over 30 minutes  SIGNED:   Charlynne Cousins, MD  [facility] 01/11/2016, 12:08 PM Pager   If 7PM-7AM, please contact night-coverage [email or web address] Password [email or web address]

## 2016-01-11 NOTE — Telephone Encounter (Signed)
Called Mary back, daughter/DPR. She reports she just wanted to make sure Dr. Oval Linsey and Rip Harbour knew that the patient was currently in the hospital. She should be going home today. Wants to know if patient will need to follow up in office sooner than November. Informed her I was unsure, would give Dr. Oval Linsey opportunity to review hospital notes and make recommendations. She voiced understanding and thanks.  Routed to MD.

## 2016-01-11 NOTE — Consult Note (Signed)
[place]  [phone] [phone]     ID: Deborah [name] DOB: [birth year]  MR#: [id number]  LOV#:[id number]  Patient Care Team: Lajean Manes, MD as PCP - General (Internal Medicine) Skeet Latch, MD as Attending Physician (Cardiology) PCP: Mathews Argyle, MD [name], MD OTHER MD: Clarene Essex MD, Baltazar Apo MD  CHIEF COMPLAINT: right hilar mass  CURRENT TREATMENT: pending definitive diagnosis  RESEARCH PROTOCOL: pending diagnosis  INTERVAL HISTORY: We have been following "Deborah [name]" closely because of continuing problems balancing her need for anticoagulation with her history of GIB. We try to minimize transfusions and to that end try to keep the ferritin >100 (she can mount a good reticulocyte response when not iron deficient), the INR between 2.0--2.5, and the Hb >9.0. She herself prefers a higher INR as she is terrified of strokes (quality of life is most important to her). -- Recently she presented with an INR of 3.80 and Hb 7.9. She received 1 mg Vit K sQ and 2 units PRBCs 01/09/2016. -- On 01/10/2016 in follow-up she c/o Right sided chest discomfort and was referred to the ED where CT angio showed no PE but there was an unsuspected Right hilar mass measuring 5.2 cm with bilateral hilar adenopathy, bilateral effusions R>L and distal right main PA narrowing secondary to mass effect. She was admitted for further evaluation and supportive care.  REVIEW OF SYSTEMS: The Right-sided chest discomfort is constant but moderate. She c/o worse SOB but no increase in cough, no fever, no purulent sputum. Feels O2 helps. Denies black/tarry stools at present. Family in room wheb visited  HISTORY OF PRESENT ILLNESS: From my initial summary:   Deborah ("Deborah [name]") Eugenio [name] is an 82 year old [place] woman referred April 2014  by Dr. Watt Climes for evaluation and treatment of iron deficiency anemia. The patient has a documented history of chronic GI  bleeding, with prior GI evaluation showing a hiatal hernia, multiple gastric polyps, and diverticular disease. She has been treated with various iron preparations, most recently with Niferex, with poor tolerance. Despite everyone's best efforts her hemoglobin has continued to drop, from 12.4 on 06/03/2012 to10.6 on 07/01/2012 and 9.6.  The GI-bleeding problem is complicated by a history of AVR and A-fib, on chronic anticoagulation. She had what appears to have been a TIA September 2016.  Her subsequent history is as detailed below   Allergies  Allergen Reactions  . Amiodarone Shortness Of Breath  . Chlorthalidone     Abdominal bloating and sob  . Alendronate Sodium Other (See Comments)    GI upset  . Macrodantin [Nitrofurantoin] Other (See Comments)    GI upset  . Meclizine Swelling    tongue  . Norvasc [Amlodipine Besylate] Swelling    edema  . Hydralazine Hcl Nausea Only  . Tussionex Pennkinetic Er [Hydrocod Polst-Cpm Polst Er] Other (See Comments)    Reaction unknown    Current Facility-Administered Medications  Medication Dose Route Frequency Provider Last Rate Last Dose  . 0.9 %  sodium chloride infusion  250 mL Intravenous PRN Nita Sells, MD      . acetaminophen (TYLENOL) tablet 650 mg  650 mg Oral Q4H PRN Nita Sells, MD   650 mg at 01/10/16 2205  . allopurinol (ZYLOPRIM) tablet 300 mg  300 mg Oral QHS Nita Sells, MD   300 mg at 01/10/16 2204  . amLODipine (NORVASC) tablet 5 mg  5 mg Oral QHS Comer Locket, PA-C   5 mg at 01/10/16 1441  . carvedilol (  COREG) tablet 6.25 mg  6.25 mg Oral BID Comer Locket, PA-C   6.25 mg at 01/10/16 2204  . cloNIDine (CATAPRES) tablet 0.1 mg  0.1 mg Oral BID Comer Locket, PA-C   0.1 mg at 01/10/16 2204  . dexamethasone (DECADRON) injection 4 mg  4 mg Intravenous Q8H Rahul P Desai, PA-C   4 mg at 01/11/16 0515  . dicyclomine (BENTYL) capsule 10 mg  10 mg Oral Daily PRN Nita Sells, MD      .  hydrOXYzine (ATARAX/VISTARIL) tablet 25 mg  25 mg Oral BID PRN Gardiner Barefoot, NP   25 mg at 01/11/16 0343  . insulin aspart (novoLOG) injection 0-9 Units  0-9 Units Subcutaneous Q4H Rahul P Desai, PA-C   2 Units at 01/11/16 0515  . irbesartan (AVAPRO) tablet 75 mg  75 mg Oral Daily Nita Sells, MD   75 mg at 01/10/16 2204  . levothyroxine (SYNTHROID, LEVOTHROID) tablet 88 mcg  88 mcg Oral QAC breakfast Nita Sells, MD      . loratadine (CLARITIN) tablet 10 mg  10 mg Oral Daily Nita Sells, MD      . nitroGLYCERIN (NITROSTAT) SL tablet 0.4 mg  0.4 mg Sublingual Q5 min PRN Nita Sells, MD      . ondansetron (ZOFRAN) injection 4 mg  4 mg Intravenous Q6H PRN Nita Sells, MD      . sodium chloride flush (NS) 0.9 % injection 10-40 mL  10-40 mL Intracatheter PRN Nita Sells, MD   10 mL at 01/11/16 0410  . sodium chloride flush (NS) 0.9 % injection 3 mL  3 mL Intravenous Q12H Nita Sells, MD   3 mL at 01/10/16 2210  . sodium chloride flush (NS) 0.9 % injection 3 mL  3 mL Intravenous PRN Nita Sells, MD      . zolpidem (AMBIEN) tablet 5 mg  5 mg Oral QHS Nita Sells, MD        PAST MEDICAL HISTORY: Past Medical History:  Diagnosis Date  . Acute CHF (congestive heart failure) ([place]) 01/10/2016  . Anemia   . Arthritis    "fingers, toes, hips; qwhere" (05/19/2013)  . Breast cancer ([place])    "right; S/P lumpectomy, chemo, XRT"   . Chronic lower GI bleeding   . Complication of anesthesia    "once I'm awakened, I don't sleep til the following night"  . GERD (gastroesophageal reflux disease)   . Gout   . Heart murmur   . High cholesterol   . History of blood transfusion 1996; ~ 2013   "related to valve replacement; GI bleeding"   . History of echocardiogram    Echo 1/17: mild LVH, EF 50-55%, no RWMA, mild AI, mechanical AVR with mean 21 mmHg/peak 42 mmHg (stable since 2012), mod MR, mod to severe BAE, PASP 36 mmHg  . History  of nuclear stress test    Myoview 1/17: EF 59%, normal perfusion, low risk  . Hypertension   . Hypothyroidism   . Permanent atrial fibrillation ([place])       . Pneumonia 07/2011  . Rectus sheath hematoma 12/19/14    held anticoagulation for 1 week, transfused  . Stomach problems    seeing Dr Watt Climes  . TIA (transient ischemic attack) 12/24/14   symptoms resolved    PAST SURGICAL HISTORY: Past Surgical History:  Procedure Laterality Date  . ABDOMINAL HYSTERECTOMY    . AORTIC VALVE REPLACEMENT  1996   [employer]  . APPENDECTOMY    . arteriogram  01/12   NO RAS   . BREAST BIOPSY Right   . BREAST LUMPECTOMY Right   . CARDIAC CATHETERIZATION    . CARDIOVERSION     X 2  . CATARACT EXTRACTION W/ INTRAOCULAR LENS  IMPLANT, BILATERAL Bilateral    bilateral cataract with lens implants  . CHOLECYSTECTOMY    . COLONOSCOPY WITH PROPOFOL N/A 11/24/2012   Procedure: COLONOSCOPY WITH PROPOFOL;  Surgeon: Jeryl Columbia, MD;  Location: [facility] ENDOSCOPY;  Service: Endoscopy;  Laterality: N/A;  . ESOPHAGOGASTRODUODENOSCOPY (EGD) WITH PROPOFOL N/A 07/25/2015   Procedure: ESOPHAGOGASTRODUODENOSCOPY (EGD) WITH PROPOFOL;  Surgeon: Clarene Essex, MD;  Location: [facility] ENDOSCOPY;  Service: Endoscopy;  Laterality: N/A;  . HOT HEMOSTASIS N/A 07/25/2015   Procedure: HOT HEMOSTASIS (ARGON PLASMA COAGULATION/BICAP);  Surgeon: Clarene Essex, MD;  Location: [facility] ENDOSCOPY;  Service: Endoscopy;  Laterality: N/A;    FAMILY HISTORY Family History  Problem Relation Age of Onset  . Dementia Mother   . Coronary artery disease Father   . Heart attack Father   . Breast cancer Daughter   . Colon cancer Sister   . Liver cancer Sister   . Lung cancer Brother   . COPD Brother   the patient's father died from a myocardial infarction at the age of 11. The patient's mother died with Alzheimer's disease at the age of 84. She had 2 brothers and one sister. Her sister had colon cancer diagnosed at age 61. Otherwise the only other person with  cancer in the family was the patient's mother's twin sister, diagnosed with breast cancer at age 30.  GYNECOLOGIC HISTORY: Menarche age 72, first live birth age 37, she is [place] P3. Underwent menopause 1972. She took hormone replacement for approximately 21 years.  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 Her daughter Dixon Boos created the [employer] here (this is the Breast Cancer Program's volunteer arm). Daughter [name] also lives in [place], is a homemaker. Son [name] "[name]" Tarkowski is a Scientist, research (medical). The patient has of 10 grandchildren She attends a [employer].                 ADVANCED DIRECTIVES: Living will in place. Her daughter Dixon Boos is her healthcare power of attorney.  HEALTH MAINTENANCE: Social History  Substance Use Topics  . Smoking status: Former Smoker    Packs/day: 1.00    Years: 20.00    Types: Cigarettes    Quit date: 04/01/1988  . Smokeless tobacco: Never Used  . Alcohol use 4.8 oz/week    4 Glasses of wine, 4 Shots of liquor per week     Comment: burbon or glass wine daily     OBJECTIVE: older White woman examined sitting at bedside Vitals:   01/10/16 2123 01/11/16 0403  BP: 120/69 126/65  Pulse: (!) 105 (!) 105  Resp: 20 20  Temp: 99.1 F (37.3 C) 98.4 F (36.9 C)     Body mass index is 22.43 kg/m.    ECOG FS:2 - Symptomatic, <50% confined to bed  Ocular: Sclerae unicteric, EOMs intact Lymphatic: No cervical or supraclavicular adenopathy Lungs no rales or rhonchi, no wheezes appreciated Heart irregular rate and rhythm Abd soft, nontender, positive bowel sounds Neuro: non-focal, well-oriented, appropriate affect Breasts: deferred   LAB RESULTS:  CMP     Component Value Date/Time   NA 140 01/11/2016 0410   NA 142 01/10/2016 1023   K 4.0 01/11/2016 0410   K 4.0 01/10/2016 1023  CL 105 01/11/2016 0410   CO2 28 01/11/2016 0410   CO2 26 01/10/2016 1023   GLUCOSE 140  (H) 01/11/2016 0410   GLUCOSE 101 01/10/2016 1023   BUN 40 (H) 01/11/2016 0410   BUN 50.3 (H) 01/10/2016 1023   CREATININE 0.85 01/11/2016 0410   CREATININE 0.9 01/10/2016 1023   CALCIUM 9.6 01/11/2016 0410   CALCIUM 10.2 01/10/2016 1023   PROT 6.3 (L) 01/10/2016 1338   PROT 6.2 (L) 01/10/2016 1023   ALBUMIN 3.8 01/10/2016 1338   ALBUMIN 3.5 01/10/2016 1023   AST 22 01/10/2016 1338   AST 20 01/10/2016 1023   ALT 21 01/10/2016 1338   ALT 19 01/10/2016 1023   ALKPHOS 44 01/10/2016 1338   ALKPHOS 56 01/10/2016 1023   BILITOT 0.6 01/10/2016 1338   BILITOT 0.37 01/10/2016 1023   GFRNONAA >60 01/11/2016 0410   GFRAA >60 01/11/2016 0410    No results found for: KPAFRELGTCHN, LAMBDASER, KAPLAMBRATIO  No results found for: Ronnald Ramp, A1GS, A2GS, Arnaldo Natal, GAMS, MSPIKE, SPEI  Lab Results  Component Value Date   WBC 6.9 01/10/2016   NEUTROABS 5.8 01/10/2016   HGB 10.4 (L) 01/10/2016   HCT 32.5 (L) 01/10/2016   MCV 92.9 01/10/2016   PLT 171 01/10/2016    '@LASTCHEMISTRY'$ @  No results found for: LABCA2  No components found for: LABCA125   Recent Labs Lab 01/10/16 1337  INR 2.02    Urinalysis    Component Value Date/Time   COLORURINE YELLOW 12/27/2015 [place] 12/27/2015 1254   LABSPEC 1.007 12/27/2015 1254   LABSPEC 1.010 03/22/2015 0917   PHURINE 6.5 12/27/2015 1254   GLUCOSEU NEGATIVE 12/27/2015 1254   GLUCOSEU Negative 03/22/2015 0917   HGBUR NEGATIVE 12/27/2015 1254   BILIRUBINUR NEGATIVE 12/27/2015 1254   BILIRUBINUR Negative 03/22/2015 0917   KETONESUR NEGATIVE 12/27/2015 1254   PROTEINUR NEGATIVE 12/27/2015 1254   UROBILINOGEN 0.2 03/22/2015 0917   NITRITE NEGATIVE 12/27/2015 1254   LEUKOCYTESUR NEGATIVE 12/27/2015 1254   LEUKOCYTESUR Negative 03/22/2015 0917    RADIOLOGY AND OTHER STUDIES: Dg Chest 2 View  Result Date: 01/10/2016 CLINICAL DATA:  Worsening shortness of breath since receiving 2 units of blood  yesterday. EXAM: CHEST  2 VIEW COMPARISON:  PA and lateral chest 12/24/2014, 05/22/2012 12/27/2015 and 07/13/2015. FINDINGS: The patient is status post right axillary dissection. Port-A-Cath is in place. There is cardiomegaly. Aortic atherosclerosis is identified. There is some coarsening of the pulmonary interstitium which does not appear notably changed since the 2014 examination. Mild blunting of the right costophrenic angle is also unchanged. No pneumothorax. IMPRESSION: Chronic interstitial coarsening appears unchanged since 2014. No acute disease. Electronically Signed   By: Inge Rise M.D.   On: 01/10/2016 12:24   Dg Chest 2 View  Result Date: 12/27/2015 CLINICAL DATA:  Maleate with fatigue and shortness of breath for 10 days. EXAM: CHEST  2 VIEW COMPARISON:  07/13/2015. FINDINGS: Trachea is midline. Heart is enlarged. Thoracic aorta is calcified and somewhat prominent. Left IJ power port tip appears to be within a left-sided SVC. Mild basilar dependent interstitial prominence with possible peripheral septal lines at the costophrenic angles. Tiny right pleural effusion. Surgical clips in the right axilla. IMPRESSION: Favor congestive heart failure. Electronically Signed   By: Lorin Picket M.D.   On: 12/27/2015 11:06   Ct Angio Chest Pe W/cm &/or Wo Cm  Result Date: 01/10/2016 CLINICAL DATA:  Shortness of breath and chest tightness, history of breast cancer EXAM: CT ANGIOGRAPHY  CHEST WITH CONTRAST TECHNIQUE: Multidetector CT imaging of the chest was performed using the standard protocol during bolus administration of intravenous contrast. Multiplanar CT image reconstructions and MIPs were obtained to evaluate the vascular anatomy. CONTRAST:  100 mL Isovue 370 intravenous COMPARISON:  Chest x-ray 12/31/2015 FINDINGS: Cardiovascular: No a definitive filling defect within the main pulmonary arteries. No definite filling defect within the central segmental pulmonary arteries on the left side.  There is moderate to severe narrowing of the distal right main pulmonary artery by a mass. Severe narrowing of segmental and subsegmental pulmonary arterial branches in the right upper lobe by a soft tissue mass. Poor filling of the terminal branches of right upper lobe pulmonary artery, suspected to be secondary to decreased flow; no discrete focal filling defects are visualized. Pulmonary arterial trunk is enlarged. There is aneurysmal dilatation of the ascending thoracic aorta, measuring up to 4.7 cm in diameter. Ectasia of the aortic arch, arch diameter is 3.2 cm. Proximal descending thoracic aortic diameter 3.2 cm. No intimal flap is seen. No mediastinal hematoma. Extensive calcifications are present within the aorta and great vessels. Aortic arch is left-sided. 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 There is a central venous catheter present within a persistent left-sided SVC. There is moderate cardiomegaly. No large pericardial effusion. Mediastinum/Nodes: Multiple enlarged, possibly necrotic lymph nodes within the mediastinum. A right pretracheal lymph node measures 2.7 by 2 cm. Inferior to this is an additional enlarged lymph node measuring 2.7 by 2.5 cm. Oval hypodense lesion to the left of the pulmonary artery trunk may reflect additional enlarged lymph node, this measures 2.6 by 2 cm. Lungs/Pleura: There are bilateral pleural effusions, small on the left and small to moderate on the right. Emphysematous disease is present bilaterally, most notable in the upper lobes. There is a large right hilar soft tissue mass, this measures 4.2 cm AP by 3.6 cm transverse by 5.2 cm cranial caudad. There is suspected invasion of the mediastinum by the mass or possible contiguity of the mass with mediastinal adenopathy. The mass encases the right-sided pulmonary artery and segmental branches of the right upper lobe. There is plaque-like soft  tissue density in the apical portion of the right upper lobe with pleural thickening suspected to represent scarring. There are similar findings in the apical portion of the left upper lobe. Hazy attenuation present within the anterior portion of right upper lobe, possible pneumonitis or post radiation change. Adjacent subpleural fibrosis. Stellate soft tissue focus subpleural right middle lobe, series 11, image number 71, possible scar. Multiple tiny nodular densities are present within the anterior aspect of the left upper lobe. Small 5 mm possible developing nodule in the left lower lobe. Upper Abdomen: Borderline enlarged spleen.  No acute abnormalities. Musculoskeletal: No acute osseous abnormality. No highly suspicious bone lesions. Surgical clips in the right axilla. Review of the MIP images confirms the above findings. IMPRESSION: 1. No definite CT evidence for acute pulmonary embolus, however right upper lobe pulmonary arterial evaluation is slightly limited by the presence of an encasing lung mass. Enlarged pulmonary arterial trunk, correlate clinically for pulmonary hypertension. 2. Large right hilar soft tissue mass with suspected invasion of the mediastinum. The mass encases and narrows the distal right main pulmonary artery and branch vessels of the right upper lobe. Multiple suspicious enlarged mediastinal lymph nodes are also present. Findings could be secondary to metastatic disease or primary lung carcinoma. 3. Small right greater than left  bilateral pleural effusions. Bilateral emphysematous disease. Additional scattered nodular densities in the anterior left upper lobe could relate to infectious or inflammatory process with metastatic disease not excluded. 4. Marked cardiomegaly. Dense atherosclerotic vascular disease of the aorta. 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 Persistent left-sided SVC containing a central  venous catheter. 5. Aneurysmal dilatation of the ascending aorta. Ascending thoracic aortic aneurysm. Recommend semi-annual imaging followup by CTA or MRA and referral to cardiothoracic surgery if not already obtained. This recommendation follows 2010 ACCF/AHA/AATS/ACR/ASA/SCA/SCAI/SIR/STS/SVM Guidelines for the Diagnosis and Management of Patients With Thoracic Aortic Disease. Circulation. 2010; 121: I951-O841 Electronically Signed   By: Donavan Foil M.D.   On: 01/10/2016 15:58      ASSESSMENT: 82 y.o. [place] with a remote history of breast cancer, remote history of smoking, with chroniciron deficiency anemia secondary to chronic GI blood loss, s/p oral iron supplementation with poor tolerance and inadequate response, chronically anticoagulated s/p AVR and AFib, now with Right hilar mass c/w primary bronchogenic carcinoma  (1) Feraheme started 07/24/2012, repeated whenever ferritin drops <100  (2) transfusing PBBCs for Hb < 9.0 or symptoms  (3) chronic GIB extensively evaluated by Dr Watt Climes with findings including AVMs, diverticular disease, hemorrhoids and gastric polyps  (4) on lifelong anticoagulation for AFib and mechanical aortic valve             (a) on warfarin until June 2016, when she was switched to Lovenox             (b) Lovenox discontinued September 2016 after rectus sheath hematoma developed             (c) warfarin resumed 12/27/2014 goal being an INR between 2.0 and 2.5  (5) Right hilar mass noted on CT angio 01/10/2016, diagnostic workup pending   PLAN: Appreciate pulmonary's rapid consultation and agree with plan for thoracentesis with cytology if feasible, EBUS if not feasible or non-diagnostic. I have also alerted my partner Dr Julien Nordmann who will have the patient's case presented at thoracic conference and will follow Ms Low as outpatient as far as the hilar mass is concerned--I will continue to follow her anemia/anticoagulation issues as before.  When ready  for discharge would send home on BID enoxaparin to facilitate control with further possible procedures pending.  The patient and her family are aware of the seriousness of the situation, and the patient has always been clear that quality of life is very important to her. They are correctly focused on obtaining a definitive diagnosis as the treatment of lung cancer--if that is what we are dealing with, other possibilities (less likely) being breast cancer recurrence, lymphoma, or other cause--is rapidly changing and in many cases targeted therapy can result in significant survival (years) without need for chemotherapy. The role of radiation in her case will also be defined once we have more definitive data.   Greatly appreciate your help to this patient and her family! Please let me know if I can be of further help at this point.  [name], MD   01/11/2016 6:32 AM Medical Oncology and Hematology [facility] [address] [place], [place] [address] Tel. [phone]    Fax. [phone]

## 2016-01-11 NOTE — Telephone Encounter (Signed)
Please schedule f/u next available.  OK to add to a short day if necessary

## 2016-01-11 NOTE — Procedures (Signed)
Thoracentesis Procedure Note  Pre-operative Diagnosis: Rt pleural effusion  Post-operative Diagnosis: same  Indications: 82 yo female former smoker with rt hilar mass and rt pleural effusion  Procedure Details  Consent: Informed consent was obtained. Risks of the procedure were discussed including: infection, bleeding, pain, pneumothorax.  Under sterile conditions the patient was positioned. Chlorohexadine solution and sterile drapes were utilized.  1% plain lidocaine was used to anesthetize the right 7 rib space. Fluid was obtained without any difficulties and minimal blood loss.  A dressing was applied to the wound and wound care instructions were provided.   Findings 550 ml of clear, yellow pleural fluid was obtained.  Complications:  None; patient tolerated the procedure well.          Condition: stable  Plan A follow up chest x-ray was ordered. Pleural fluid sent for glucose, protein, LDH, cell count, culture, and cytology.   Procedure performed under direct supervision of Dr. Halford Chessman and with ultrasound guidance.  Noe Gens, NP-C [facility] Pgr: [phone] or if no answer [phone] 01/11/2016, 11:31 AM

## 2016-01-11 NOTE — Progress Notes (Signed)
ANTICOAGULATION CONSULT NOTE - Follow-up  Pharmacy Consult for enoxaparin Indication: atrial fibrillation  Allergies  Allergen Reactions  . Amiodarone Shortness Of Breath  . Chlorthalidone     Abdominal bloating and sob  . Alendronate Sodium Other (See Comments)    GI upset  . Macrodantin [Nitrofurantoin] Other (See Comments)    GI upset  . Meclizine Swelling    tongue  . Norvasc [Amlodipine Besylate] Swelling    edema  . Hydralazine Hcl Nausea Only  . Tussionex Pennkinetic Er [Hydrocod Polst-Cpm Polst Er] Other (See Comments)    Reaction unknown    Patient Measurements: Height: 5' (152.4 cm) Weight: 114 lb 13.8 oz (52.1 kg) IBW/kg (Calculated) : 45.5   Vital Signs: Temp: 98.4 F (36.9 C) (10/12 0403) Temp Source: Oral (10/12 0403) BP: 126/65 (10/12 0403) Pulse Rate: 105 (10/12 0403)  Labs:  Recent Labs  01/10/16 1021 01/10/16 1023 01/10/16 1337 01/10/16 1338 01/10/16 1920 01/11/16 0410 01/11/16 1010  HGB 11.2*  --   --  10.6* 10.4*  --   --   HCT 34.7*  --   --  33.3* 32.5*  --   --   PLT 161  --   --  170 171  --   --   LABPROT  --   --  23.2*  --   --   --  18.2*  INR 2.60  --  2.02  --   --   --  1.49  CREATININE  --  0.9  --  0.81  --  0.85  --     Estimated Creatinine Clearance: 34.1 mL/min (by C-G formula based on SCr of 0.85 mg/dL).  Assessment: 82 year old female with hx of mechanical AVR and Afib  (chad2Vasc2 5) on coumadin.on presentation INR 3.8, phytonadione '1mg'$  given. Pt with new lung mass, pharmacy consulted to  transition pt. to lovenox to facilitate work-up.  She underwent thoracentesis 10/12.  Discussed with pulmonary nurse practitioner, await CXR results before given LMWH  Home warfarin dose:  Per 9/20 note pt instructed to reduce warfarin to '5mg'$  MWF / 2.'5mg'$  TTSS  but still taking '5mg'$  TTSS and 2.'5mg'$  MWF. Last dose 10/9  INR goal was  2-2.5 d/t chronic GIB from AVMs  01/11/2016: INR = 1.49 CBC: Hgb low/stable, pltc WNL Renal: SCr  WNL and CrCl > 44m/min   Goal of Therapy:  Anti-Xa level 0.6-1 units/ml 4hrs after LMWH dose given Monitor platelets by anticoagulation protocol   Plan:   Since patient has mechanical AVR, will require '1mg'$ /kg q12h enoxaparin, start '50mg'$  SQ q12h after CXR performed and no contraindications to LMWH therapy  Monitor renal function and CBC - check at least q72h while in hospital   DDoreene Eland PharmD, BCPS.   Pager: [phone]/03/2016 11:44 AM

## 2016-01-11 NOTE — Progress Notes (Signed)
Patient discharged back to [place] with family as ordered. Patient was A&O upon D/C, no signs of distress. O2 Sats stable on 2L/[place], respirations even & unlabored. Thoracentesis performed this AM with no complications. Patient did c/o some discomfort and pain to right side of chest after thoracentesis was completed. NP Alfredo Martinez was made aware and ordered one time dose of PO Toradol. Medication was given and effective. Discharge paperwork explained to patient and daughter who both verbalized understanding. Home O2 ordered and portable tank was delivered and sent with patient. Printed prescriptions also given to patient. Patient assisted to daughter's vehicle by W/C with nursing staff.

## 2016-01-11 NOTE — Consult Note (Signed)
   [facility] CM Inpatient Consult   01/11/2016  JHOSELIN CRUME 1926-06-22 [id number]     Referral received from inpatient [facility] for [place] Management program services. Went to bedside to speak with patient and daughter to offer [place] Management. Ms. Mansour is agreeable and written consent obtained.  She lives alone in [place] at [place]. Daughter states she plans on having a nurse at [place] with lovenox injections.  Discussed that Ms. Ahmed will receive post hospital discharge calls and will be evaluated for monthly home visits. Explained that [place] Management will not interfere or replace services provided by [facility]. Confirmed best contact number as [phone]. Confirmed Primary Care MD is Dr. Felipa Eth. However, Ms. Torpey states she primarily sees Dr. Jana Hakim. [facility] Care Management packet, contact information, and 24-hr nurse line magnet provided. Appreciative of visit.  Will request to be assigned for [facility] RNCM for transition of care. She has history of Afib with RVR, CHF, TIA, HTN, breast cancer, pleural effusion, s/p thoracentesis, new mass on upper right lung. She is discharging today.    Marthenia Rolling, MSN-Ed, RN,BSN [facility] Liaison [phone]

## 2016-01-11 NOTE — Progress Notes (Signed)
Name: Deborah [name] MRN: [id number] DOB: 04-10-1926    ADMISSION DATE:  01/10/2016 CONSULTATION DATE:  01/10/16  REFERRING MD :  Verlon Au  CHIEF COMPLAINT:  R hilar mass   HISTORY OF PRESENT ILLNESS:   82 yo female former smoker presented with dyspnea, weight loss.  She had CXR and CT chest which showed Rt hilar mass and Rt pleural effusion.  She has hx of Rt breast cancer s/p lumpectomy and chemo-radiation in 1994.  She is followed by hematology for chronic anemia.  SUBJECTIVE:  Pt reports breathing is much better this am.  Continues to have pressure in her right chest.  She did not sleep last night.    VITAL SIGNS: Temp:  [97.9 F (36.6 C)-99.1 F (37.3 C)] 98.4 F (36.9 C) (10/12 0403) Pulse Rate:  [69-106] 105 (10/12 0403) Resp:  [16-29] 20 (10/12 0403) BP: (120-175)/(65-97) 126/65 (10/12 0403) SpO2:  [87 %-100 %] 98 % (10/12 0403) Weight:  [114 lb 13.8 oz (52.1 kg)-119 lb 14.9 oz (54.4 kg)] 114 lb 13.8 oz (52.1 kg) (10/12 0403)  PHYSICAL EXAMINATION: General: Elderly female, resting in bed, in NAD. Neuro: A&O x 3, non-focal.  HEENT: [place]/AT. PERRL, sclerae anicteric. Cardiovascular: IRIR, no M/R/G.  Lungs: even/non-labored, CTA bilaterally, No W/R/R.  Slight dullness to percussion RLL Abdomen: BS x 4, soft, NT/ND.  Musculoskeletal: No gross deformities, no edema.  Skin: Intact, warm, no rashes.     Recent Labs Lab 01/10/16 1023 01/10/16 1338 01/11/16 0410  NA 142 141 140  K 4.0 3.8 4.0  CL  --  109 105  CO2 '26 26 28  '$ BUN 50.3* 48* 40*  CREATININE 0.9 0.81 0.85  GLUCOSE 101 99 140*    Recent Labs Lab 01/10/16 1021 01/10/16 1338 01/10/16 1920  HGB 11.2* 10.6* 10.4*  HCT 34.7* 33.3* 32.5*  WBC 5.0 5.5 6.9  PLT 161 170 171   Dg Chest 2 View  Result Date: 01/10/2016 CLINICAL DATA:  Worsening shortness of breath since receiving 2 units of blood yesterday. EXAM: CHEST  2 VIEW COMPARISON:  PA and lateral chest 12/24/2014, 05/22/2012 12/27/2015 and  07/13/2015. FINDINGS: The patient is status post right axillary dissection. Port-A-Cath is in place. There is cardiomegaly. Aortic atherosclerosis is identified. There is some coarsening of the pulmonary interstitium which does not appear notably changed since the 2014 examination. Mild blunting of the right costophrenic angle is also unchanged. No pneumothorax. IMPRESSION: Chronic interstitial coarsening appears unchanged since 2014. No acute disease. Electronically Signed   By: Inge Rise M.D.   On: 01/10/2016 12:24   Ct Angio Chest Pe W/cm &/or Wo Cm  Result Date: 01/10/2016 CLINICAL DATA:  Shortness of breath and chest tightness, history of breast cancer EXAM: CT ANGIOGRAPHY CHEST WITH CONTRAST TECHNIQUE: Multidetector CT imaging of the chest was performed using the standard protocol during bolus administration of intravenous contrast. Multiplanar CT image reconstructions and MIPs were obtained to evaluate the vascular anatomy. CONTRAST:  100 mL Isovue 370 intravenous COMPARISON:  Chest x-ray 12/31/2015 FINDINGS: Cardiovascular: No a definitive filling defect within the main pulmonary arteries. No definite filling defect within the central segmental pulmonary arteries on the left side. There is moderate to severe narrowing of the distal right main pulmonary artery by a mass. Severe narrowing of segmental and subsegmental pulmonary arterial branches in the right upper lobe by a soft tissue mass. Poor filling of the terminal branches of right upper lobe pulmonary artery, suspected to be secondary to decreased flow; no discrete focal  filling defects are visualized. Pulmonary arterial trunk is enlarged. There is aneurysmal dilatation of the ascending thoracic aorta, measuring up to 4.7 cm in diameter. Ectasia of the aortic arch, arch diameter is 3.2 cm. Proximal descending thoracic aortic diameter 3.2 cm. No intimal flap is seen. No mediastinal hematoma. Extensive calcifications are present within the  aorta and great vessels. Aortic arch is left-sided. 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 There is a central venous catheter present within a persistent left-sided SVC. There is moderate cardiomegaly. No large pericardial effusion. Mediastinum/Nodes: Multiple enlarged, possibly necrotic lymph nodes within the mediastinum. A right pretracheal lymph node measures 2.7 by 2 cm. Inferior to this is an additional enlarged lymph node measuring 2.7 by 2.5 cm. Oval hypodense lesion to the left of the pulmonary artery trunk may reflect additional enlarged lymph node, this measures 2.6 by 2 cm. Lungs/Pleura: There are bilateral pleural effusions, small on the left and small to moderate on the right. Emphysematous disease is present bilaterally, most notable in the upper lobes. There is a large right hilar soft tissue mass, this measures 4.2 cm AP by 3.6 cm transverse by 5.2 cm cranial caudad. There is suspected invasion of the mediastinum by the mass or possible contiguity of the mass with mediastinal adenopathy. The mass encases the right-sided pulmonary artery and segmental branches of the right upper lobe. There is plaque-like soft tissue density in the apical portion of the right upper lobe with pleural thickening suspected to represent scarring. There are similar findings in the apical portion of the left upper lobe. Hazy attenuation present within the anterior portion of right upper lobe, possible pneumonitis or post radiation change. Adjacent subpleural fibrosis. Stellate soft tissue focus subpleural right middle lobe, series 11, image number 71, possible scar. Multiple tiny nodular densities are present within the anterior aspect of the left upper lobe. Small 5 mm possible developing nodule in the left lower lobe. Upper Abdomen: Borderline enlarged spleen.  No acute abnormalities. Musculoskeletal: No acute osseous abnormality. No highly  suspicious bone lesions. Surgical clips in the right axilla. Review of the MIP images confirms the above findings. IMPRESSION: 1. No definite CT evidence for acute pulmonary embolus, however right upper lobe pulmonary arterial evaluation is slightly limited by the presence of an encasing lung mass. Enlarged pulmonary arterial trunk, correlate clinically for pulmonary hypertension. 2. Large right hilar soft tissue mass with suspected invasion of the mediastinum. The mass encases and narrows the distal right main pulmonary artery and branch vessels of the right upper lobe. Multiple suspicious enlarged mediastinal lymph nodes are also present. Findings could be secondary to metastatic disease or primary lung carcinoma. 3. Small right greater than left bilateral pleural effusions. Bilateral emphysematous disease. Additional scattered nodular densities in the anterior left upper lobe could relate to infectious or inflammatory process with metastatic disease not excluded. 4. Marked cardiomegaly. Dense atherosclerotic vascular disease of the aorta. 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 5. Aneurysmal dilatation of the ascending aorta. Ascending thoracic aortic aneurysm. Recommend semi-annual imaging followup by CTA or MRA and referral to cardiothoracic surgery if not already obtained. This recommendation follows 2010 ACCF/AHA/AATS/ACR/ASA/SCA/SCAI/SIR/STS/SVM Guidelines for the Diagnosis and Management of Patients With Thoracic Aortic Disease. Circulation. 2010; 121: A263-F354 Electronically Signed   By: Donavan Foil M.D.   On: 01/10/2016 15:58    STUDIES:  CTA Chest 10/11 > no PE. Large right hilar soft tissue mass with suspected invasion of mediastinum. Bilateral effusions R > L.  SIGNIFICANT EVENTS  10/11  Admit with SOB  10/12  Planned thoracentesis, bedside assessment  of pleural fluid with estimated 500 ml   ASSESSMENT / PLAN:  Right Hilar Mass - large mass encasing the RUL PA, associated compression of RUL bronchus and lymphadenopathy.  Would consider malignant until proven otherwise, unclear primary. Bilateral pleural effusions, R > L - also likely malignant. Acute hypoxic respiratory failure - due to above + concern volume overload (after 2u PRBC on 01/09/16). Hx R Breast Cancer - 1994  Plan: Proceed with bedside thoracentesis  CXR post thora  OK for patient to eat this am  Suspect pathology will not return until Monday.  She does not necessarily have to remain inpatient for results unless patient symptomatic If cytology negative, then consider EBUS with Dr. Lamonte Sakai for tissue sampling  Continue decadron '4mg'$  q8hrs with SSI coverage.  May need steroids for comfort Monitor fluid status Oncology follow up with Dr. Jana Hakim / Dr. Earlie Server  OK to resume lovenox post thoracentesis CXR Will need ambulatory O2 assessment prior to discharge   Goals of Care  DNR/DNI > patient requests "quality" of life be primary focus. Wants to pursue work up a "step at a time".  She is interested in all therapies that would promote quality of life and do not have significant side effects.    Rest per primary team.  Noe Gens, NP-C [facility] Pgr: [phone] or if no answer [phone] 01/11/2016, 9:46 AM   Breathing better after getting lasix and decadron.  No wheeze.  HR irregular with 2/6 SM.  Abd soft.    CT chest - Rt hilar mass and Rt pleural effusion Bedside u/s - free flowing Rt effusion  INR 2.02, PLT 171, Hb 10.4, WBC 6.9  Assessment/plan: 82 yo female former smoker with Rt hilar mass and Rt pleural effusion. - main concern is for primary lung cancer - will proceed with right thoracentesis >> procedure explained.  Risked detailed as bleeding, infection, pneumothorax, non diagnosis - if pleural fluid analysis inconclusive, will  then need to discuss risk/benefit of bronchoscopy  Updated pt's daughter at bedside.  Chesley Mires, MD [facility] Pulmonary/Critical Care 01/11/2016, 10:39 AM Pager:  [phone] After 3pm call: [phone]

## 2016-01-11 NOTE — Progress Notes (Addendum)
Spoke with pt's daughter Deborah [name] at bedside concerning Home O2. Referral called to [facility]. Daughter [name] states that she is getting a Marine scientist from [employer] to give her mother shots.

## 2016-01-11 NOTE — Telephone Encounter (Signed)
New Message  Pt daughter call requesting to speak with RN. Pt daughter states pt is in the hospital and would like to speak with RN. Pt daughter did not want to disclose any information about call. Pt daughter ask that RN call back pt Please call back to discuss

## 2016-01-12 ENCOUNTER — Encounter: Payer: Self-pay | Admitting: *Deleted

## 2016-01-12 ENCOUNTER — Telehealth: Payer: Self-pay | Admitting: Pulmonary Disease

## 2016-01-12 ENCOUNTER — Other Ambulatory Visit: Payer: Self-pay | Admitting: *Deleted

## 2016-01-12 ENCOUNTER — Other Ambulatory Visit: Payer: Self-pay | Admitting: Oncology

## 2016-01-12 ENCOUNTER — Ambulatory Visit: Payer: Medicare Other

## 2016-01-12 NOTE — Telephone Encounter (Signed)
Rt thoracentesis 01/11/16 >> 500 ml fluid, glucose 149, protein < 3, LDH 69, WBC 688 (71% L, 29% M), cytology negative   Results d/w Deborah [name].  Explained that fluid was negative, but she still has RUL mass and this is likely separate process from pleural effusion.  Will have my nurse schedule ROV next week with Dr. Lamonte Sakai to review options to assess RUL mass further >> discussed bronchoscopy vs PET scan vs clinical observation.

## 2016-01-12 NOTE — Patient Outreach (Addendum)
Initial transition of care call completed, initial assessment completed. Pt was admitted for SOB and AFIB. A lung mass was discovered and pt has had a needle biopsy yesterday. She is to receive results on Monday. She is to see Dr. Felipa Eth on Tuesday.  Pt given my phone number and 24 nurse line number. She has been advised my partner Jacqlyn Larsen, RN, will check in with her next week.  I will call her the following week.  Patient was recently discharged from hospital and all medications have been reviewed.    Medication List       Accurate as of 01/12/16  2:37 PM. Always use your most recent med list.          acetaminophen 500 MG tablet Commonly known as:  TYLENOL Take 1,000 mg by mouth every 6 (six) hours as needed for mild pain, moderate pain or headache. Reported on 09/26/2015   [facility] ALLERGY 180 MG tablet Generic drug:  fexofenadine Take 180 mg by mouth daily.   allopurinol 300 MG tablet Commonly known as:  ZYLOPRIM Take 300 mg by mouth at bedtime.   amLODipine 5 MG tablet Commonly known as:  NORVASC Take 5 mg by mouth at bedtime.   BENTYL 10 MG capsule Generic drug:  dicyclomine Take 10 mg by mouth daily as needed for spasms.   calcium-vitamin D 500 MG tablet Take 1 tablet by mouth 2 (two) times daily. Reported on 09/26/2015   carvedilol 6.25 MG tablet Commonly known as:  COREG Take 1 tablet (6.25 mg total) by mouth 2 (two) times daily.   celecoxib 200 MG capsule Commonly known as:  CELEBREX Take 200 mg by mouth every other day.   cloNIDine 0.1 MG tablet Commonly known as:  CATAPRES Take 0.1 mg by mouth 2 (two) times daily.   cloNIDine 0.2 mg/24hr patch Commonly known as:  CATAPRES - Dosed in mg/24 hr Place 1 patch (0.2 mg total) onto the skin once a week.   dexamethasone 4 MG tablet Commonly known as:  DECADRON Take 1 tablet (4 mg total) by mouth 2 (two) times daily with a meal.   enoxaparin 60 MG/0.6ML injection Commonly known as:  LOVENOX Inject  0.5 mLs (50 mg total) into the skin every 12 (twelve) hours.   furosemide 40 MG tablet Commonly known as:  LASIX Take 1-2 tablets daily as directed.   hydrOXYzine 25 MG tablet Commonly known as:  ATARAX/VISTARIL Take 25 mg by mouth 3 (three) times daily as needed for anxiety.   levothyroxine 88 MCG tablet Commonly known as:  SYNTHROID, LEVOTHROID Take 1 tablet (88 mcg total) by mouth daily before breakfast.   NITROSTAT 0.4 MG SL tablet Generic drug:  nitroGLYCERIN Place 0.4 mg under the tongue every 5 (five) minutes as needed for chest pain (x 3 doses). Reported on 10/17/2015   potassium chloride SA 20 MEQ tablet Commonly known as:  K-DUR,KLOR-CON Take 10 meq (1/2 tab) as directed.   PROBIOTIC ADVANCED PO Take 1 tablet by mouth daily. Reported on 10/17/2015   simvastatin 20 MG tablet Commonly known as:  ZOCOR Take 10 mg by mouth daily. TAKE 1/2 TAB   valsartan 320 MG tablet Commonly known as:  DIOVAN Take 1 tablet (320 mg total) by mouth daily.   zolpidem 12.5 MG CR tablet Commonly known as:  AMBIEN CR Take 12.5 mg by mouth at bedtime as needed for sleep.      Fall Risk  01/12/2016  Falls in the past year? No  Depression screen PHQ 2/9 01/12/2016  Decreased Interest 0  Down, Depressed, Hopeless 0  PHQ - 2 Score 0  Some recent data might be hidden   THN CM Care Plan Problem One   Flowsheet Row Most Recent Value  Care Plan Problem One  New Diagnosis of a lung lesion that is under investigation  Role Documenting the Problem One  Care Management [place] for Problem One  Active  THN Long Term Goal (31-90 days)  Pt will not readmit to the hospital in the next 31 days.  THN Long Term Goal Start Date  01/12/16  Interventions for Problem One Long Term Goal  Gave pt my phone number, and 24 hour nurse line, Advised to call for any problem to avoid complications.  THN CM Short Term Goal #1 (0-30 days)  Pt will see specialist for new diagosis and participate  in the plan of care suggested over the next 30 days.  THN CM Short Term Goal #1 Start Date  01/12/16  Interventions for Short Term Goal #1  Encouraged follow through on MD appts.  THN CM Short Term Goal #2 (0-30 days)  Pt will participate in weekly transition of care calls over the next 30 days.  THN CM Short Term Goal #2 Start Date  01/12/16  Interventions for Short Term Goal #2  Advised pt that [facility] will call weekly for transition of care support calls and assist in any way necessary over the next 30 days.     Deloria Lair [facility] [place] [phone]

## 2016-01-14 ENCOUNTER — Other Ambulatory Visit: Payer: Self-pay | Admitting: Oncology

## 2016-01-14 LAB — BODY FLUID CULTURE: CULTURE: NO GROWTH

## 2016-01-15 ENCOUNTER — Other Ambulatory Visit (HOSPITAL_BASED_OUTPATIENT_CLINIC_OR_DEPARTMENT_OTHER): Payer: Medicare Other

## 2016-01-15 ENCOUNTER — Telehealth: Payer: Self-pay | Admitting: Emergency Medicine

## 2016-01-15 ENCOUNTER — Other Ambulatory Visit: Payer: Self-pay | Admitting: *Deleted

## 2016-01-15 ENCOUNTER — Other Ambulatory Visit: Payer: Self-pay | Admitting: Cardiovascular Disease

## 2016-01-15 ENCOUNTER — Other Ambulatory Visit: Payer: Self-pay | Admitting: Oncology

## 2016-01-15 ENCOUNTER — Ambulatory Visit (HOSPITAL_BASED_OUTPATIENT_CLINIC_OR_DEPARTMENT_OTHER): Payer: Medicare Other

## 2016-01-15 VITALS — BP 120/57 | HR 77 | Temp 97.7°F | Resp 17

## 2016-01-15 DIAGNOSIS — D649 Anemia, unspecified: Secondary | ICD-10-CM

## 2016-01-15 DIAGNOSIS — D500 Iron deficiency anemia secondary to blood loss (chronic): Secondary | ICD-10-CM

## 2016-01-15 DIAGNOSIS — K921 Melena: Secondary | ICD-10-CM

## 2016-01-15 DIAGNOSIS — K922 Gastrointestinal hemorrhage, unspecified: Secondary | ICD-10-CM

## 2016-01-15 LAB — CBC WITH DIFFERENTIAL/PLATELET
BASO%: 0.2 % (ref 0.0–2.0)
Basophils Absolute: 0 10*3/uL (ref 0.0–0.1)
EOS ABS: 0.1 10*3/uL (ref 0.0–0.5)
EOS%: 0.9 % (ref 0.0–7.0)
HEMATOCRIT: 23.5 % — AB (ref 34.8–46.6)
HGB: 7.4 g/dL — ABNORMAL LOW (ref 11.6–15.9)
LYMPH%: 13.3 % — AB (ref 14.0–49.7)
MCH: 30.1 pg (ref 25.1–34.0)
MCHC: 31.5 g/dL (ref 31.5–36.0)
MCV: 95.5 fL (ref 79.5–101.0)
MONO#: 0.8 10*3/uL (ref 0.1–0.9)
MONO%: 12.8 % (ref 0.0–14.0)
NEUT#: 4.7 10*3/uL (ref 1.5–6.5)
NEUT%: 72.8 % (ref 38.4–76.8)
PLATELETS: 192 10*3/uL (ref 145–400)
RBC: 2.46 10*6/uL — AB (ref 3.70–5.45)
RDW: 20.3 % — ABNORMAL HIGH (ref 11.2–14.5)
WBC: 6.5 10*3/uL (ref 3.9–10.3)
lymph#: 0.9 10*3/uL (ref 0.9–3.3)
nRBC: 0 % (ref 0–0)

## 2016-01-15 LAB — FERRITIN: FERRITIN: 42 ng/mL (ref 9–269)

## 2016-01-15 LAB — COMPREHENSIVE METABOLIC PANEL
ALBUMIN: 2.9 g/dL — AB (ref 3.5–5.0)
ALK PHOS: 37 U/L — AB (ref 40–150)
ALT: 19 U/L (ref 0–55)
ANION GAP: 5 meq/L (ref 3–11)
AST: 15 U/L (ref 5–34)
BUN: 76.2 mg/dL — ABNORMAL HIGH (ref 7.0–26.0)
CALCIUM: 10.9 mg/dL — AB (ref 8.4–10.4)
CHLORIDE: 106 meq/L (ref 98–109)
CO2: 25 mEq/L (ref 22–29)
Creatinine: 1.3 mg/dL — ABNORMAL HIGH (ref 0.6–1.1)
EGFR: 38 mL/min/{1.73_m2} — AB (ref >90)
Glucose: 91 mg/dl (ref 70–140)
POTASSIUM: 4.5 meq/L (ref 3.5–5.1)
Sodium: 136 mEq/L (ref 136–145)
Total Bilirubin: 0.3 mg/dL (ref 0.20–1.20)
Total Protein: 5 g/dL — ABNORMAL LOW (ref 6.4–8.3)

## 2016-01-15 LAB — PREPARE RBC (CROSSMATCH)

## 2016-01-15 LAB — DRAW EXTRA CLOT TUBE

## 2016-01-15 MED ORDER — DIPHENHYDRAMINE HCL 25 MG PO CAPS
25.0000 mg | ORAL_CAPSULE | Freq: Once | ORAL | Status: AC
  Administered 2016-01-15: 25 mg via ORAL

## 2016-01-15 MED ORDER — ACETAMINOPHEN 325 MG PO TABS
650.0000 mg | ORAL_TABLET | Freq: Once | ORAL | Status: AC
  Administered 2016-01-15: 650 mg via ORAL

## 2016-01-15 MED ORDER — HEPARIN SOD (PORK) LOCK FLUSH 100 UNIT/ML IV SOLN
500.0000 [IU] | Freq: Every day | INTRAVENOUS | Status: AC | PRN
  Administered 2016-01-15: 500 [IU]
  Filled 2016-01-15: qty 5

## 2016-01-15 MED ORDER — SODIUM CHLORIDE 0.9 % IV SOLN
INTRAVENOUS | Status: DC
  Administered 2016-01-15: 13:00:00 via INTRAVENOUS

## 2016-01-15 MED ORDER — DIPHENHYDRAMINE HCL 25 MG PO CAPS
ORAL_CAPSULE | ORAL | Status: AC
  Filled 2016-01-15: qty 1

## 2016-01-15 MED ORDER — SODIUM CHLORIDE 0.9 % IV SOLN
510.0000 mg | Freq: Once | INTRAVENOUS | Status: AC
  Administered 2016-01-15: 510 mg via INTRAVENOUS
  Filled 2016-01-15: qty 17

## 2016-01-15 MED ORDER — ACETAMINOPHEN 325 MG PO TABS
ORAL_TABLET | ORAL | Status: AC
  Filled 2016-01-15: qty 2

## 2016-01-15 NOTE — Telephone Encounter (Signed)
I discussed with Dr Halford Chessman. Believe that we need to see her discuss the options. In meantime I will call dr Griffith Citron as well.   Please overbook her to see me this week.

## 2016-01-15 NOTE — Telephone Encounter (Signed)
Please review for refill. Thanks!  

## 2016-01-15 NOTE — Telephone Encounter (Signed)
Daughter in law, Deborah [name], called back and said that Dr. Jana Hakim wanted patient to have a bronchoscopy done.  She said that she doesn't think she needs to schedule an appointment after all, just needs to have a bronchoscopy scheduled.  I do not have any records of this conversation.  Advised Sabet of this and she wanted Dr. Lamonte Sakai to discuss with Dr. Jana Hakim to see what needs to be done in hopes that they can skip another appointment.  Dr. Drucie Ip advise.

## 2016-01-15 NOTE — Telephone Encounter (Signed)
Patient 's daughter called back and would like a call back. She can be reached at [phone]

## 2016-01-15 NOTE — Telephone Encounter (Signed)
Per Dr. Juanetta Gosling last OV:   Rt thoracentesis 01/11/16 >> 500 ml fluid, glucose 149, protein < 3, LDH 69, WBC 688 (71% L, 29% M), cytology negative  Results d/w Ms. Simmers. Explained that fluid was negative, but she still has RUL mass and this is likely separate process from pleural effusion.  Will have my nurse schedule ROV next week with Dr. Lamonte Sakai to review options to assess RUL mass further >> discussed bronchoscopy vs PET scan vs clinical observation. ------------- Dr. Lamonte Sakai, Patient's daughter in law called wanting to get patient in this week since she is going to only be in town until Friday and she wanted to be at the appointment with the patient to hear all of the risks involved in the bronchoscopy vs PET vs Clinical observation.  Advised her that your first available opening is not until Monday of next week, but she said that her family is a big contributor to the hospital and that she wanted to see if we could work her in this week.  Please advise.

## 2016-01-15 NOTE — Patient Outreach (Signed)
01/15/16- Telephone call to pt for transition of care week 2, spoke with pt, HIPAA verified, pt reports she has "alot of family visiting and cannot talk long". Pt states she received results from lung biopsy and states there are no cancer cells in the fluid and she will follow up with MD on "next steps".  RN CM unable to continue conversation as pt has to hang up.  THN CM Care Plan Problem One   Flowsheet Row Most Recent Value  Care Plan Problem One  New Diagnosis of a lung lesion that is under investigation  Role Documenting the Problem One  Care Management [place] for Problem One  Active  THN Long Term Goal (31-90 days)  Pt will not readmit to the hospital in the next 31 days.  THN Long Term Goal Start Date  01/12/16  Interventions for Problem One Long Term Goal  Reinforced contact numbers to call for change in health status and symptoms  THN CM Short Term Goal #1 (0-30 days)  Pt will see specialist for new diagosis and participate in the plan of care suggested over the next 30 days.  THN CM Short Term Goal #1 Start Date  01/12/16  Interventions for Short Term Goal #1  Reinforced follow through on MD appts.  THN CM Short Term Goal #2 (0-30 days)  Pt will participate in weekly transition of care calls over the next 30 days.  THN CM Short Term Goal #2 Start Date  01/12/16  Interventions for Short Term Goal #2  Pt agreeable to continue weekly transition of care calls and appreciates the support      PLAN Continue weekly transition of care calls  Jacqlyn Larsen [facility], [place] Coordinator [phone]

## 2016-01-15 NOTE — Patient Instructions (Addendum)
 Ferumoxytol injection What is this medicine? FERUMOXYTOL is an iron complex. Iron is used to make healthy red blood cells, which carry oxygen and nutrients throughout the body. This medicine is used to treat iron deficiency anemia in people with chronic kidney disease. This medicine may be used for other purposes; ask your health care provider or pharmacist if you have questions. What should I tell my health care provider before I take this medicine? They need to know if you have any of these conditions: -anemia not caused by low iron levels -high levels of iron in the blood -magnetic resonance imaging (MRI) test scheduled -an unusual or allergic reaction to iron, other medicines, foods, dyes, or preservatives -pregnant or trying to get pregnant -breast-feeding How should I use this medicine? This medicine is for injection into a vein. It is given by a health care professional in a hospital or clinic setting. Talk to your pediatrician regarding the use of this medicine in children. Special care may be needed. Overdosage: If you think you have taken too much of this medicine contact a poison control center or emergency room at once. NOTE: This medicine is only for you. Do not share this medicine with others. What if I miss a dose? It is important not to miss your dose. Call your doctor or health care professional if you are unable to keep an appointment. What may interact with this medicine? This medicine may interact with the following medications: -other iron products This list may not describe all possible interactions. Give your health care provider a list of all the medicines, herbs, non-prescription drugs, or dietary supplements you use. Also tell them if you smoke, drink alcohol, or use illegal drugs. Some items may interact with your medicine. What should I watch for while using this medicine? Visit your doctor or healthcare professional regularly. Tell your doctor or healthcare  professional if your symptoms do not start to get better or if they get worse. You may need blood work done while you are taking this medicine. You may need to follow a special diet. Talk to your doctor. Foods that contain iron include: whole grains/cereals, dried fruits, beans, or peas, leafy green vegetables, and organ meats (liver, kidney). What side effects may I notice from receiving this medicine? Side effects that you should report to your doctor or health care professional as soon as possible: -allergic reactions like skin rash, itching or hives, swelling of the face, lips, or tongue -breathing problems -changes in blood pressure -feeling faint or lightheaded, falls -fever or chills -flushing, sweating, or hot feelings -swelling of the ankles or feet Side effects that usually do not require medical attention (Report these to your doctor or health care professional if they continue or are bothersome.): -diarrhea -headache -nausea, vomiting -stomach pain This list may not describe all possible side effects. Call your doctor for medical advice about side effects. You may report side effects to FDA at 1-800-FDA-1088. Where should I keep my medicine? This drug is given in a hospital or clinic and will not be stored at home. NOTE: This sheet is a summary. It may not cover all possible information. If you have questions about this medicine, talk to your doctor, pharmacist, or health care provider.    2016, Elsevier/Gold Standard. (2011-11-01 15:23:36) Blood Transfusion  A blood transfusion is a procedure in which you receive donated blood through an IV tube. You may need a blood transfusion because of illness, surgery, or injury. The blood may come from   a donor, or it may be your own blood that you donated previously. The blood given in a transfusion is made up of different types of cells. You may receive:  Red blood cells. These carry oxygen and replace lost blood.  Platelets. These  control bleeding.  Plasma. Thishelps blood to clot. If you have hemophilia or another clotting disorder, you may also receive other types of blood products. LET YOUR HEALTH CARE PROVIDER KNOW ABOUT:  Any allergies you have.  All medicines you are taking, including vitamins, herbs, eye drops, creams, and over-the-counter medicines.  Previous problems you or members of your family have had with the use of anesthetics.  Any blood disorders you have.  Previous surgeries you have had.  Any medical conditions you may have.  Any previous reactions you have had during a blood transfusion.  RISKS AND COMPLICATIONS Generally, this is a safe procedure. However, problems may occur, including:  Having an allergic reaction to something in the donated blood.  Fever. This may be a reaction to the white blood cells in the transfused blood.  Iron overload. This can happen from having many transfusions.  Transfusion-related acute lung injury (TRALI). This is a rare reaction that causes lung damage. The cause is not known.TRALI can occur within hours of a transfusion or several days later.  Sudden (acute) or delayed hemolytic reactions. This happens if your blood does not match the cells in your transfusion. Your body's defense system (immune system) may try to attack the new cells. This complication is rare.  Infection. This is rare. BEFORE THE PROCEDURE  You may have a blood test to determine your blood type. This is necessary to know what kind of blood your body will accept.  If you are going to have a planned surgery, you may donate your own blood. This may be done in case you need to have a transfusion.  If you have had an allergic reaction to a transfusion in the past, you may be given medicine to help prevent a reaction. Take this medicine only as directed by your health care provider.  You will have your temperature, blood pressure, and pulse monitored before the  transfusion. PROCEDURE   An IV will be started in your hand or arm.  The bag of donated blood will be attached to your IV tube and given into your vein.  Your temperature, blood pressure, and pulse will be monitored regularly during the transfusion. This monitoring is done to detect early signs of a transfusion reaction.  If you have any signs or symptoms of a reaction, your transfusion will be stopped and you may be given medicine.  When the transfusion is over, your IV will be removed.  Pressure may be applied to the IV site for a few minutes.  A bandage (dressing) will be applied. The procedure may vary among health care providers and hospitals. AFTER THE PROCEDURE  Your blood pressure, temperature, and pulse will be monitored regularly.   This information is not intended to replace advice given to you by your health care provider. Make sure you discuss any questions you have with your health care provider.   Document Released: 03/15/2000 Document Revised: 04/08/2014 Document Reviewed: 01/26/2014 Elsevier Interactive Patient Education 2016 Elsevier Inc.  

## 2016-01-15 NOTE — Telephone Encounter (Signed)
Patient scheduled to see Dr. Lamonte Sakai tomorrow, 01/16/16 at 330pm.  Patient aware to arrive 15 min early. Nothing further needed.

## 2016-01-16 ENCOUNTER — Other Ambulatory Visit: Payer: Medicare Other

## 2016-01-16 ENCOUNTER — Encounter: Payer: Self-pay | Admitting: Emergency Medicine

## 2016-01-16 ENCOUNTER — Ambulatory Visit: Payer: Medicare Other | Admitting: Oncology

## 2016-01-16 ENCOUNTER — Ambulatory Visit (INDEPENDENT_AMBULATORY_CARE_PROVIDER_SITE_OTHER): Payer: Medicare Other | Admitting: Emergency Medicine

## 2016-01-16 ENCOUNTER — Encounter: Payer: Self-pay | Admitting: *Deleted

## 2016-01-16 VITALS — BP 118/72 | HR 86 | Ht 60.0 in | Wt 123.0 lb

## 2016-01-16 DIAGNOSIS — R918 Other nonspecific abnormal finding of lung field: Secondary | ICD-10-CM

## 2016-01-16 DIAGNOSIS — I6330 Cerebral infarction due to thrombosis of unspecified cerebral artery: Secondary | ICD-10-CM | POA: Diagnosis not present

## 2016-01-16 NOTE — Progress Notes (Signed)
Subjective:    Patient ID: Deborah [name], female    DOB: Sep 19, 1926, 82 y.o.   MRN: [id number]  HPI  82 y.o. woman, Former smoker (~20 pk-yrs), with a PMH remote right sided breast CA s/p lumpectomy and chemo / radiation in 1994. She has been cancer free since. She also has a history of hypertension, hyperlipidemia, chronic atrial fibrillation and a [employer] mechanical aortic valve replacement on anticoagulation, recently changed to enoxaparin twice a day.  Has seen Dr. Jana Hakim for anemia presumed due to chronic GI blood loss in the setting of AVMs and gets PRN blood transfusions (just had 2 units 01/09/16). On 10/11, she was brought to [facility] ED with SOB.  Had CTA that demonstrated right sided lung mass; therefore, PCCM was asked to see. There was an associated right effusion that was tapped. Cytology has not revealed any evidence of malignancy. She is seen now for further plans regarding her right hilar mass.  I have reviewed her CT scan of the chest from 01/10/16. This shows no evidence pulmonary embolism but does reveal a right hilar mass that surrounds and narrows the right main pulmonary artery, narrows and impacts the anterior segment bronchus of the right upper lobe. The bronchus is narrowed but is not clear as to whether there is an endobronchial lesion. Also noted is an enlarged 4R lymph node adjacent to the hilar mass.  Review of Systems As per HPI  Past Medical History:  Diagnosis Date  . Acute CHF (congestive heart failure) ([place]) 01/10/2016  . Anemia   . Arthritis    "fingers, toes, hips; qwhere" (05/19/2013)  . Breast cancer ([place])    "right; S/P lumpectomy, chemo, XRT"   . Chronic lower GI bleeding   . Complication of anesthesia    "once I'm awakened, I don't sleep til the following night"  . GERD (gastroesophageal reflux disease)   . Gout   . Heart murmur   . High cholesterol   . History of blood transfusion 1996; ~ 2013   "related to valve replacement; GI bleeding"   .  History of echocardiogram    Echo 1/17: mild LVH, EF 50-55%, no RWMA, mild AI, mechanical AVR with mean 21 mmHg/peak 42 mmHg (stable since 2012), mod MR, mod to severe BAE, PASP 36 mmHg  . History of nuclear stress test    Myoview 1/17: EF 59%, normal perfusion, low risk  . Hypertension   . Hypothyroidism   . Permanent atrial fibrillation ([place])       . Pneumonia 07/2011  . Rectus sheath hematoma 12/19/14    held anticoagulation for 1 week, transfused  . Stomach problems    seeing Dr Watt Climes  . TIA (transient ischemic attack) 12/24/14   symptoms resolved     Family History  Problem Relation Age of Onset  . Dementia Mother   . Coronary artery disease Father   . Heart attack Father   . Breast cancer Daughter   . Colon cancer Sister   . Liver cancer Sister   . Lung cancer Brother   . COPD Brother      Social History   Social History  . Marital status: Widowed    Spouse name: N/A  . Number of children: N/A  . Years of education: N/A   Occupational History  . Not on file.   Social History Main Topics  . Smoking status: Former Smoker    Packs/day: 1.00    Years: 20.00    Types: Cigarettes  Quit date: 04/01/1988  . Smokeless tobacco: Never Used  . Alcohol use 4.8 oz/week    4 Glasses of wine, 4 Shots of liquor per week     Comment: burbon or glass wine daily  . Drug use: No  . Sexual activity: No   Other Topics Concern  . Not on file   Social History Narrative  . No narrative on file     Allergies  Allergen Reactions  . Amiodarone Shortness Of Breath  . Chlorthalidone     Abdominal bloating and sob  . Alendronate Sodium Other (See Comments)    GI upset  . Macrodantin [Nitrofurantoin] Other (See Comments)    GI upset  . Meclizine Swelling    tongue  . Norvasc [Amlodipine Besylate] Swelling    edema  . Hydralazine Hcl Nausea Only  . Tussionex Pennkinetic Er [Hydrocod Polst-Cpm Polst Er] Other (See Comments)    Reaction unknown     Outpatient Medications  Prior to Visit  Medication Sig Dispense Refill  . acetaminophen (TYLENOL) 500 MG tablet Take 1,000 mg by mouth every 6 (six) hours as needed for mild pain, moderate pain or headache. Reported on 09/26/2015    . allopurinol (ZYLOPRIM) 300 MG tablet Take 300 mg by mouth at bedtime.     Marland Kitchen amLODipine (NORVASC) 5 MG tablet Take 5 mg by mouth at bedtime.    . calcium-vitamin D 500 MG tablet Take 1 tablet by mouth 2 (two) times daily. Reported on 09/26/2015    . carvedilol (COREG) 6.25 MG tablet TAKE ONE TABLET TWICE DAILY 60 tablet 1  . celecoxib (CELEBREX) 200 MG capsule Take 200 mg by mouth every other day.   4  . cloNIDine (CATAPRES - DOSED IN MG/24 HR) 0.2 mg/24hr patch Place 1 patch (0.2 mg total) onto the skin once a week. (Patient not taking: Reported on 01/10/2016) 4 patch 12  . cloNIDine (CATAPRES) 0.1 MG tablet Take 0.1 mg by mouth 2 (two) times daily.    Marland Kitchen dexamethasone (DECADRON) 4 MG tablet Take 1 tablet (4 mg total) by mouth 2 (two) times daily with a meal. 30 tablet 0  . dicyclomine (BENTYL) 10 MG capsule Take 10 mg by mouth daily as needed for spasms.     Marland Kitchen enoxaparin (LOVENOX) 60 MG/0.6ML injection Inject 0.5 mLs (50 mg total) into the skin every 12 (twelve) hours. 30 Syringe 0  . fexofenadine (ALLEGRA ALLERGY) 180 MG tablet Take 180 mg by mouth daily.     . furosemide (LASIX) 40 MG tablet Take 1-2 tablets daily as directed. 45 tablet 6  . hydrOXYzine (ATARAX/VISTARIL) 25 MG tablet Take 25 mg by mouth 3 (three) times daily as needed for anxiety.    Marland Kitchen levothyroxine (SYNTHROID, LEVOTHROID) 88 MCG tablet Take 1 tablet (88 mcg total) by mouth daily before breakfast. 90 tablet 3  . NITROSTAT 0.4 MG SL tablet Place 0.4 mg under the tongue every 5 (five) minutes as needed for chest pain (x 3 doses). Reported on 10/17/2015  1  . potassium chloride SA (K-DUR,KLOR-CON) 20 MEQ tablet Take 10 meq (1/2 tab) as directed. 15 tablet 6  . Probiotic Product (PROBIOTIC ADVANCED PO) Take 1 tablet by mouth  daily. Reported on 10/17/2015    . simvastatin (ZOCOR) 20 MG tablet Take 10 mg by mouth daily. TAKE 1/2 TAB    . valsartan (DIOVAN) 320 MG tablet Take 1 tablet (320 mg total) by mouth daily. 30 tablet 5  . zolpidem (AMBIEN CR) 12.5 MG CR  tablet Take 12.5 mg by mouth at bedtime as needed for sleep.     No facility-administered medications prior to visit.          Objective:   Physical Exam Vitals:   01/16/16 1533 01/16/16 1534  BP:  118/72  Pulse:  86  SpO2:  97%  Weight: 55.8 kg (123 lb)   Height: 5' (1.524 m)    Gen: Pleasant, well-nourished, in no distress,  normal affect  ENT: No lesions,  mouth clear,  oropharynx clear, no postnasal drip  Neck: No JVD, no TMG, no carotid bruits  Lungs: No use of accessory muscles, no dullness to percussion, clear without rales or rhonchi  Cardiovascular: RRR, heart sounds normal, no murmur or gallops, no peripheral edema  Musculoskeletal: No deformities, no cyanosis or clubbing  Neuro: alert, non focal  Skin: Warm, no lesions or rashes    CT chest 01/10/16 --  COMPARISON:  Chest x-ray 12/31/2015  FINDINGS: Cardiovascular: No a definitive filling defect within the main pulmonary arteries. No definite filling defect within the central segmental pulmonary arteries on the left side. There is moderate to severe narrowing of the distal right main pulmonary artery by a mass. Severe narrowing of segmental and subsegmental pulmonary arterial branches in the right upper lobe by a soft tissue mass. Poor filling of the terminal branches of right upper lobe pulmonary artery, suspected to be secondary to decreased flow; no discrete focal filling defects are visualized. Pulmonary arterial trunk is enlarged. There is aneurysmal dilatation of the ascending thoracic aorta, measuring up to 4.7 cm in diameter. Ectasia of the aortic arch, arch diameter is 3.2 cm. Proximal descending thoracic aortic diameter 3.2 cm. No intimal flap is seen. No  mediastinal hematoma. Extensive calcifications are present within the aorta and great vessels. Aortic arch is left-sided. 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 There is a central venous catheter present within a persistent left-sided SVC. There is moderate cardiomegaly. No large pericardial effusion.  Mediastinum/Nodes: Multiple enlarged, possibly necrotic lymph nodes within the mediastinum. A right pretracheal lymph node measures 2.7 by 2 cm. Inferior to this is an additional enlarged lymph node measuring 2.7 by 2.5 cm. Oval hypodense lesion to the left of the pulmonary artery trunk may reflect additional enlarged lymph node, this measures 2.6 by 2 cm.  Lungs/Pleura: There are bilateral pleural effusions, small on the left and small to moderate on the right. Emphysematous disease is present bilaterally, most notable in the upper lobes. There is a large right hilar soft tissue mass, this measures 4.2 cm AP by 3.6 cm transverse by 5.2 cm cranial caudad. There is suspected invasion of the mediastinum by the mass or possible contiguity of the mass with mediastinal adenopathy. The mass encases the right-sided pulmonary artery and segmental branches of the right upper lobe.  There is plaque-like soft tissue density in the apical portion of the right upper lobe with pleural thickening suspected to represent scarring. There are similar findings in the apical portion of the left upper lobe. Hazy attenuation present within the anterior portion of right upper lobe, possible pneumonitis or post radiation change. Adjacent subpleural fibrosis. Stellate soft tissue focus subpleural right middle lobe, series 11, image number 71, possible scar. Multiple tiny nodular densities are present within the anterior aspect of the left upper lobe. Small 5 mm possible developing nodule in the left lower lobe.  Upper Abdomen:  Borderline enlarged spleen.  No acute  abnormalities.  Musculoskeletal: No acute osseous abnormality. No highly suspicious bone lesions. Surgical clips in the right axilla.  Review of the MIP images confirms the above findings.  IMPRESSION: 1. No definite CT evidence for acute pulmonary embolus, however right upper lobe pulmonary arterial evaluation is slightly limited by the presence of an encasing lung mass. Enlarged pulmonary arterial trunk, correlate clinically for pulmonary hypertension. 2. Large right hilar soft tissue mass with suspected invasion of the mediastinum. The mass encases and narrows the distal right main pulmonary artery and branch vessels of the right upper lobe. Multiple suspicious enlarged mediastinal lymph nodes are also present. Findings could be secondary to metastatic disease or primary lung carcinoma. 3. Small right greater than left bilateral pleural effusions. Bilateral emphysematous disease. Additional scattered nodular densities in the anterior left upper lobe could relate to infectious or inflammatory process with metastatic disease not excluded. 4. Marked cardiomegaly. Dense atherosclerotic vascular disease of the aorta. 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 5. Aneurysmal dilatation of the ascending aorta. Ascending thoracic aortic aneurysm. Recommend semi-annual imaging followup by CTA or MRA and referral to cardiothoracic surgery if not already obtained.      Assessment & Plan:  Mass of upper lobe of right lung Right hilar mass in a patient with a history of tobacco use, also with a remote history of breast cancer. This is most consistent with a primary lung cancer. Discussed in detail the options for proceeding with the patient and her family today. The options for invasive testing include biopsy  through either bronchoscopy and EBUS under general anesthesia, standard bronchoscopy on her conscious sedation. Also possible that we may find an alternative location biopsy if we believe there is metastatic disease present. A PET scan would help as revealed such a target if it exists. I also discussed with them the possibility of conservative management, watchful waiting. At this point I do not believe they want to go with such a conservative strategy. She would be willing to hear and discuss the possible therapeutic options should she get a tissue diagnosis. From a practical standpoint interbronchial ultrasound would be the most sensitive test to get a tissue diagnosis. Unfortunately this would necessitate general anesthesia which carries significant risk in this woman. She is also high risk for conscious sedation by believe that she can tolerate this [name] than general anesthesia. A standard bronchoscopy to look for an endobronchial lesion in the right upper lobe with therefore likely be less risky. After discussing the options we decided to obtain a PET scan now, assess for any other evidence for metastatic disease that would be a good target for biopsy. If nonexistent and I expect that we will plan bronchoscopy either in the OR or under conscious sedation. I will follow-up with her after the PET scan has been done to discuss.   Baltazar Apo, MD, PhD 01/19/2016, 9:45 AM [facility] [phone] or if no answer [phone]

## 2016-01-16 NOTE — Progress Notes (Signed)
Oncology Nurse Navigator Documentation  Oncology Nurse Navigator Flowsheets 01/16/2016  Navigator Encounter Type Other/I received information from Dr. Jana Hakim that patient may need to be seen with Dr. Julien Nordmann.  Cytology is negative cancer.  I reached out to Dr. Lamonte Sakai to offer assistance if needed.   Treatment Phase Pre-Tx/Tx Discussion  Barriers/Navigation Needs Coordination of Care  Interventions Coordination of Care  Coordination of Care Appts  Acuity Level 1  Time Spent with Patient 15

## 2016-01-16 NOTE — Patient Instructions (Signed)
We are going to perform a PET scan of your chest. Based on these results we will determine what our next steps will be.

## 2016-01-17 ENCOUNTER — Ambulatory Visit (HOSPITAL_COMMUNITY)
Admission: RE | Admit: 2016-01-17 | Discharge: 2016-01-17 | Disposition: A | Discharge status: Home / Self Care | Payer: Medicare Other | Source: Ambulatory Visit | Attending: Oncology | Admitting: Oncology

## 2016-01-17 ENCOUNTER — Ambulatory Visit (HOSPITAL_BASED_OUTPATIENT_CLINIC_OR_DEPARTMENT_OTHER): Payer: Medicare Other

## 2016-01-17 ENCOUNTER — Telehealth: Payer: Self-pay

## 2016-01-17 ENCOUNTER — Other Ambulatory Visit: Payer: Self-pay

## 2016-01-17 DIAGNOSIS — D500 Iron deficiency anemia secondary to blood loss (chronic): Secondary | ICD-10-CM

## 2016-01-17 DIAGNOSIS — I119 Hypertensive heart disease without heart failure: Secondary | ICD-10-CM

## 2016-01-17 DIAGNOSIS — I4819 Other persistent atrial fibrillation: Secondary | ICD-10-CM

## 2016-01-17 DIAGNOSIS — K921 Melena: Secondary | ICD-10-CM

## 2016-01-17 DIAGNOSIS — D689 Coagulation defect, unspecified: Secondary | ICD-10-CM

## 2016-01-17 DIAGNOSIS — K922 Gastrointestinal hemorrhage, unspecified: Secondary | ICD-10-CM

## 2016-01-17 DIAGNOSIS — D62 Acute posthemorrhagic anemia: Secondary | ICD-10-CM

## 2016-01-17 DIAGNOSIS — D649 Anemia, unspecified: Secondary | ICD-10-CM | POA: Diagnosis not present

## 2016-01-17 LAB — FERRITIN: FERRITIN: 239 ng/mL (ref 9–269)

## 2016-01-17 LAB — CBC WITH DIFFERENTIAL/PLATELET
BASO%: 0.2 % (ref 0.0–2.0)
Basophils Absolute: 0 10*3/uL (ref 0.0–0.1)
EOS ABS: 0.1 10*3/uL (ref 0.0–0.5)
EOS%: 1.4 % (ref 0.0–7.0)
HEMATOCRIT: 25.4 % — AB (ref 34.8–46.6)
HEMOGLOBIN: 8 g/dL — AB (ref 11.6–15.9)
LYMPH#: 0.6 10*3/uL — AB (ref 0.9–3.3)
LYMPH%: 11.7 % — ABNORMAL LOW (ref 14.0–49.7)
MCH: 30.7 pg (ref 25.1–34.0)
MCHC: 31.5 g/dL (ref 31.5–36.0)
MCV: 97.3 fL (ref 79.5–101.0)
MONO#: 0.4 10*3/uL (ref 0.1–0.9)
MONO%: 7.4 % (ref 0.0–14.0)
NEUT%: 79.3 % — ABNORMAL HIGH (ref 38.4–76.8)
NEUTROS ABS: 4.1 10*3/uL (ref 1.5–6.5)
PLATELETS: 169 10*3/uL (ref 145–400)
RBC: 2.61 10*6/uL — ABNORMAL LOW (ref 3.70–5.45)
RDW: 19.8 % — ABNORMAL HIGH (ref 11.2–14.5)
WBC: 5.2 10*3/uL (ref 3.9–10.3)

## 2016-01-17 LAB — PREPARE RBC (CROSSMATCH)

## 2016-01-17 MED ORDER — DIPHENHYDRAMINE HCL 25 MG PO CAPS
25.0000 mg | ORAL_CAPSULE | Freq: Once | ORAL | Status: AC
  Administered 2016-01-17: 25 mg via ORAL
  Filled 2016-01-17: qty 1

## 2016-01-17 MED ORDER — HEPARIN SOD (PORK) LOCK FLUSH 100 UNIT/ML IV SOLN
500.0000 [IU] | Freq: Every day | INTRAVENOUS | Status: AC | PRN
Start: 2016-01-17 — End: 2016-01-17
  Administered 2016-01-17: 500 [IU]
  Filled 2016-01-17: qty 5

## 2016-01-17 MED ORDER — SODIUM CHLORIDE 0.9% FLUSH
10.0000 mL | INTRAVENOUS | Status: AC | PRN
  Administered 2016-01-17: 10 mL

## 2016-01-17 MED ORDER — SODIUM CHLORIDE 0.9 % IV SOLN
250.0000 mL | Freq: Once | INTRAVENOUS | Status: AC
  Administered 2016-01-17: 250 mL via INTRAVENOUS

## 2016-01-17 MED ORDER — ACETAMINOPHEN 325 MG PO TABS
650.0000 mg | ORAL_TABLET | Freq: Once | ORAL | Status: AC
  Administered 2016-01-17: 650 mg via ORAL
  Filled 2016-01-17: qty 2

## 2016-01-17 NOTE — Telephone Encounter (Signed)
Scheduled for 01/24/16

## 2016-01-17 NOTE — Telephone Encounter (Signed)
Pt walked in at 915 expecting a lab appt, an infusion appt, and a lovenox shot to be given to her. S/w Dr Jana Hakim. appt set up at [facility] for blood, lovenox shot given as courtesy with pt supply from home. Lab appt set up. Called [facility] to inform them we are rechecking CBC before sending pt to [facility]. CBC showed Hgb 8.0. Pt to receive 1 unit blood.

## 2016-01-17 NOTE — Progress Notes (Signed)
Patient ID: Deborah [name], female   DOB: 1926/08/01, 82 y.o.   MRN: [id number] Provider; [name], MD  Associated Diagnosis: Symptomatic Anemia  Procedure: Transfusion of 1 unit PRBC  Patient tolerated transfusion well. No reaction. Went over discharge instructions and copy given to patient. Son with patient and discharged to home. Alert, oriented and ambulatory.

## 2016-01-17 NOTE — Discharge Instructions (Signed)
Blood Transfusion  A blood transfusion is a procedure that gives you donated blood through an IV tube. You may need blood because of illness, surgery, or injury. The blood may come from a donor. The blood may also be your own blood that you donated earlier. The blood you get is made up of different types of cells. You may get:   Red blood cells. These carry oxygen and replace lost blood.   Platelets. These control bleeding.   Plasma. This helps blood to clot. If you have a clotting disorder, you may also get other types of blood products.  BEFORE THE PROCEDURE  You may have a blood test. This finds out what type of blood you have. It also finds out what kind of blood your body will accept.   If you are going to have a planned surgery, you may donate your own blood. This is done in case you need to have a transfusion.   If you have had an allergic transfusion reaction before, you may be given medicine to help prevent a reaction. Take this medicine only as told by your doctor.  You will have your temperature, blood pressure, and pulse checked. PROCEDURE   An IV will be started in your hand or arm.   The bag of donated blood will be attached to your IV and run into your vein.   A doctor will regularly check your temperature, blood pressure, and pulse during the procedure. This is done to find any early signs of a transfusion reaction.  If you have any signs or symptoms of a reaction, the procedure may be stopped and you may be given medicine.   When the transfusion is over, your IV will be removed.   Pressure may be applied to the IV site for a few minutes.   A bandage (dressing) will be applied.  The procedure may vary among doctors and hospitals.  AFTER THE PROCEDURE  Your blood pressure, temperature, and pulse will be checked regularly.   This information is not intended to replace advice given to you by your health care provider. Make sure you discuss any questions  you have with your health care provider.   Document Released: 06/14/2008 Document Revised: 04/08/2014 Document Reviewed: 01/26/2014 Elsevier Interactive Patient Education 2016 [place].   Blood Transfusion, Care After These instructions give you information about caring for yourself after your procedure. Your doctor may also give you more specific instructions. Call your doctor if you have any problems or questions after your procedure.  HOME CARE  Take medicines only as told by your doctor. Ask your doctor if you can take an over-the-counter pain reliever if you have a fever or headache a day or two after your procedure.  Return to your normal activities as told by your doctor. GET HELP IF:   You develop redness or irritation at your IV site.  You have a fever, chills, or a headache that does not go away.  Your pee (urine) is darker than normal.  Your urine turns:  Pink.  Red.  Owens Shark.  The white part of your eye turns yellow (jaundice).  You feel weak after doing your normal activities. GET HELP RIGHT AWAY IF:   You have trouble breathing.  You have fever and chills and you also have:  Anxiety.  Chest or back pain.  Flushed or pink skin.  Clammy or sweaty skin.  A fast heartbeat.  A sick feeling in your stomach (nausea).   This information  is not intended to replace advice given to you by your health care provider. Make sure you discuss any questions you have with your health care provider.   Document Released: 04/08/2014 Document Reviewed: 04/08/2014 Elsevier Interactive Patient Education [employer].

## 2016-01-18 ENCOUNTER — Telehealth: Payer: Self-pay | Admitting: Pharmacist

## 2016-01-18 ENCOUNTER — Telehealth: Payer: Self-pay | Admitting: *Deleted

## 2016-01-18 LAB — TYPE AND SCREEN
ABO/RH(D): O POS
Antibody Screen: NEGATIVE
Unit division: 0
Unit division: 0

## 2016-01-18 NOTE — Telephone Encounter (Signed)
Pt called to report that pressure has been running low.   Told to decrease clonidine to 0.'1mg'$  once daily and hold valsartan today.

## 2016-01-18 NOTE — Telephone Encounter (Signed)
Per desk RN I have called and canceled appt for tomorrow and moved to next week. Patient is aware

## 2016-01-19 ENCOUNTER — Ambulatory Visit: Payer: Medicare Other

## 2016-01-19 NOTE — Telephone Encounter (Signed)
Pt was seen 01/16/16 with Dr Lamonte Sakai and discussed results.  Pt is scheduled for PET scan next week. Plans to follow up with RB for further eval. Nothing further needed.

## 2016-01-19 NOTE — Assessment & Plan Note (Signed)
Right hilar mass in a patient with a history of tobacco use, also with a remote history of breast cancer. This is most consistent with a primary lung cancer. Discussed in detail the options for proceeding with the patient and her family today. The options for invasive testing include biopsy through either bronchoscopy and EBUS under general anesthesia, standard bronchoscopy on her conscious sedation. Also possible that we may find an alternative location biopsy if we believe there is metastatic disease present. A PET scan would help as revealed such a target if it exists. I also discussed with them the possibility of conservative management, watchful waiting. At this point I do not believe they want to go with such a conservative strategy. She would be willing to hear and discuss the possible therapeutic options should she get a tissue diagnosis. From a practical standpoint interbronchial ultrasound would be the most sensitive test to get a tissue diagnosis. Unfortunately this would necessitate general anesthesia which carries significant risk in this woman. She is also high risk for conscious sedation by believe that she can tolerate this better than general anesthesia. A standard bronchoscopy to look for an endobronchial lesion in the right upper lobe with therefore likely be less risky. After discussing the options we decided to obtain a PET scan now, assess for any other evidence for metastatic disease that would be a good target for biopsy. If nonexistent and I expect that we will plan bronchoscopy either in the OR or under conscious sedation. I will follow-up with her after the PET scan has been done to discuss.

## 2016-01-23 ENCOUNTER — Other Ambulatory Visit: Payer: Self-pay | Admitting: Oncology

## 2016-01-23 ENCOUNTER — Ambulatory Visit (HOSPITAL_BASED_OUTPATIENT_CLINIC_OR_DEPARTMENT_OTHER): Payer: Medicare Other

## 2016-01-23 VITALS — BP 132/53 | HR 90 | Temp 97.7°F | Resp 18

## 2016-01-23 DIAGNOSIS — Z23 Encounter for immunization: Secondary | ICD-10-CM | POA: Diagnosis not present

## 2016-01-23 DIAGNOSIS — Z954 Presence of other heart-valve replacement: Secondary | ICD-10-CM

## 2016-01-23 DIAGNOSIS — D500 Iron deficiency anemia secondary to blood loss (chronic): Secondary | ICD-10-CM

## 2016-01-23 DIAGNOSIS — K922 Gastrointestinal hemorrhage, unspecified: Secondary | ICD-10-CM | POA: Diagnosis not present

## 2016-01-23 MED ORDER — INFLUENZA VAC SPLIT QUAD 0.5 ML IM SUSY
0.5000 mL | PREFILLED_SYRINGE | Freq: Once | INTRAMUSCULAR | Status: AC
  Administered 2016-01-23: 0.5 mL via INTRAMUSCULAR
  Filled 2016-01-23: qty 0.5

## 2016-01-23 MED ORDER — HEPARIN SOD (PORK) LOCK FLUSH 100 UNIT/ML IV SOLN
500.0000 [IU] | Freq: Once | INTRAVENOUS | Status: AC
  Administered 2016-01-23: 500 [IU] via INTRAVENOUS
  Filled 2016-01-23: qty 5

## 2016-01-23 MED ORDER — SODIUM CHLORIDE 0.9 % IV SOLN
INTRAVENOUS | Status: DC
  Administered 2016-01-23: 15:00:00 via INTRAVENOUS

## 2016-01-23 MED ORDER — SODIUM CHLORIDE 0.9 % IV SOLN
510.0000 mg | Freq: Once | INTRAVENOUS | Status: AC
  Administered 2016-01-23: 510 mg via INTRAVENOUS
  Filled 2016-01-23: qty 17

## 2016-01-23 MED ORDER — SODIUM CHLORIDE 0.9% FLUSH
10.0000 mL | INTRAVENOUS | Status: DC | PRN
  Administered 2016-01-23: 10 mL via INTRAVENOUS
  Filled 2016-01-23: qty 10

## 2016-01-23 NOTE — Patient Instructions (Addendum)

## 2016-01-24 ENCOUNTER — Encounter: Payer: Self-pay | Admitting: Cardiovascular Disease

## 2016-01-24 ENCOUNTER — Ambulatory Visit (INDEPENDENT_AMBULATORY_CARE_PROVIDER_SITE_OTHER): Payer: Medicare Other | Admitting: Cardiovascular Disease

## 2016-01-24 VITALS — BP 125/60 | HR 75 | Ht 60.0 in | Wt 122.6 lb

## 2016-01-24 DIAGNOSIS — R0600 Dyspnea, unspecified: Secondary | ICD-10-CM

## 2016-01-24 DIAGNOSIS — E876 Hypokalemia: Secondary | ICD-10-CM

## 2016-01-24 DIAGNOSIS — E7800 Pure hypercholesterolemia, unspecified: Secondary | ICD-10-CM

## 2016-01-24 DIAGNOSIS — I10 Essential (primary) hypertension: Secondary | ICD-10-CM | POA: Diagnosis not present

## 2016-01-24 DIAGNOSIS — I6330 Cerebral infarction due to thrombosis of unspecified cerebral artery: Secondary | ICD-10-CM

## 2016-01-24 DIAGNOSIS — I4891 Unspecified atrial fibrillation: Secondary | ICD-10-CM

## 2016-01-24 DIAGNOSIS — I712 Thoracic aortic aneurysm, without rupture: Secondary | ICD-10-CM

## 2016-01-24 DIAGNOSIS — Z952 Presence of prosthetic heart valve: Secondary | ICD-10-CM

## 2016-01-24 DIAGNOSIS — I7121 Aneurysm of the ascending aorta, without rupture: Secondary | ICD-10-CM | POA: Insufficient documentation

## 2016-01-24 DIAGNOSIS — Z7901 Long term (current) use of anticoagulants: Secondary | ICD-10-CM

## 2016-01-24 MED ORDER — ZALEPLON 5 MG PO CAPS: 5.0000 mg | ORAL_CAPSULE | Freq: Every evening | ORAL | 1 refills | Status: DC | PRN

## 2016-01-24 NOTE — Progress Notes (Signed)
d   Cardiology Office Note   Date:  01/24/2016   ID:  Deborah [name], DOB 04/28/1926, MRN [id number]  PCP:  Mathews Argyle, MD  Cardiologist:   Skeet Latch, MD  GI: Dr. Sarina Ser  Chief Complaint  Patient presents with  . Follow-up    sob; frequently and fluid build up.      History of Present Illness: Deborah [name] is a 82 y.o. female with hypertension, hyperlipidemia, chronic atrial fibrillation, stroke, hypothyroidism, prior mechanical aortic valve replacement, and NSVT who presents for follow-up.  Deborah [name] was previously a patient of Dr. Mare Ferrari.  Deborah [name] previously developed a retroperitoneal hematoma on warfarin in September 2006 and was temporarily switched to Lovenox. However she was transitioned back to warfarin. Deborah [name] underwent upper endoscopy on 07/25/15 that showed a small hiatal hernia.  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  Her anticoagulation is managed by her oncologist, Dr. Doreen Salvage.  Her INR goal is 2-2.5. She continues following up with her oncologist for management of her anemia and requires frequent infusion of IV iron. She had a Lexiscan Cardiolite 04/2015 that was negative for ischemia. Her echo in 04/2015 showed mild to moderate aortic stenosis across her mechanical aortic valve (mean gradient 21 mmHg).  She had a subsequent echo 12/2015 due to recurrent shortness of breath.  The mean gradient was 18 mmHg and it was otherwise unchanged.    She noted increased swelling and shortness of breath and was seen by Ellen Henri on 11/23/15.  At that time she had a BNP that was 331 and potassium was 3.3.  She was started on lasix and HCTZ was discontinued. This improved her edema but she developed worsened episodes of hypertension. She was started on clonidine which has helped her blood pressure.  Since then she continues to struggle with volume overload and acute renal failure  from diuresis.  She notes that whenever she gets infusions of iron or blood transfusions she gets more short of breath.  She was seen in the ED 10/11 after getting two units of blood.  She had a CT scan looking for PE that revealed a large right hilar soft tissue mass with invasion into the mediastinum. The mass encased and narrowed the distal right main pulmonary artery and the branch vessels to the right upper lobe. Enlarged mediastinal lymph nodes were also noted. She was also noted to have small pleural effusions and emphysematous changes.  She was also noted to have an ascending aortic aneurysm measuring 4.7 cm in diameter.  She is scheduled for PET scan later this week. Endobronchial biopsy is felt to be risky given her cardiopulmonary status. She notes that she is willing to consider noninvasive therapies but does not want any aggressive interventions.  Deborah [name] has not tolerated hydralazine due to abdominal discomfort. She develops following with higher doses of amlodipine. She developed bradycardia with higher doses of beta blocker.   Past Medical History:  Diagnosis Date  . Acute CHF (congestive heart failure) ([place]) 01/10/2016  . Anemia   . Arthritis    "fingers, toes, hips; qwhere" (05/19/2013)  . Breast cancer ([place])    "right; S/P lumpectomy, chemo, XRT"   . Chronic lower GI bleeding   . Complication of anesthesia    "once I'm awakened, I don't sleep til the following night"  . GERD (gastroesophageal reflux disease)   . Gout   . Heart murmur   . High  cholesterol   . History of blood transfusion 1996; ~ 2013   "related to valve replacement; GI bleeding"   . History of echocardiogram    Echo 1/17: mild LVH, EF 50-55%, no RWMA, mild AI, mechanical AVR with mean 21 mmHg/peak 42 mmHg (stable since 2012), mod MR, mod to severe BAE, PASP 36 mmHg  . History of nuclear stress test    Myoview 1/17: EF 59%, normal perfusion, low risk  . Hypertension   . Hypothyroidism   . Permanent  atrial fibrillation ([place])       . Pneumonia 07/2011  . Rectus sheath hematoma 12/19/14    held anticoagulation for 1 week, transfused  . Stomach problems    seeing Dr Watt Climes  . TIA (transient ischemic attack) 12/24/14   symptoms resolved    Past Surgical History:  Procedure Laterality Date  . ABDOMINAL HYSTERECTOMY    . AORTIC VALVE REPLACEMENT  1996   [employer]  . APPENDECTOMY    . arteriogram  01/12   NO RAS   . BREAST BIOPSY Right   . BREAST LUMPECTOMY Right   . CARDIAC CATHETERIZATION    . CARDIOVERSION     X 2  . CATARACT EXTRACTION W/ INTRAOCULAR LENS  IMPLANT, BILATERAL Bilateral    bilateral cataract with lens implants  . CHOLECYSTECTOMY    . COLONOSCOPY WITH PROPOFOL N/A 11/24/2012   Procedure: COLONOSCOPY WITH PROPOFOL;  Surgeon: Jeryl Columbia, MD;  Location: [facility] ENDOSCOPY;  Service: Endoscopy;  Laterality: N/A;  . ESOPHAGOGASTRODUODENOSCOPY (EGD) WITH PROPOFOL N/A 07/25/2015   Procedure: ESOPHAGOGASTRODUODENOSCOPY (EGD) WITH PROPOFOL;  Surgeon: Clarene Essex, MD;  Location: [facility] ENDOSCOPY;  Service: Endoscopy;  Laterality: N/A;  . HOT HEMOSTASIS N/A 07/25/2015   Procedure: HOT HEMOSTASIS (ARGON PLASMA COAGULATION/BICAP);  Surgeon: Clarene Essex, MD;  Location: [facility] ENDOSCOPY;  Service: Endoscopy;  Laterality: N/A;     Current Outpatient Prescriptions  Medication Sig Dispense Refill  . acetaminophen (TYLENOL) 500 MG tablet Take 1,000 mg by mouth every 6 (six) hours as needed for mild pain, moderate pain or headache. Reported on 09/26/2015    . allopurinol (ZYLOPRIM) 300 MG tablet Take 300 mg by mouth at bedtime.     Marland Kitchen amLODipine (NORVASC) 5 MG tablet Take 5 mg by mouth at bedtime.    . calcium-vitamin D 500 MG tablet Take 1 tablet by mouth 2 (two) times daily. Reported on 09/26/2015    . carvedilol (COREG) 6.25 MG tablet TAKE ONE TABLET TWICE DAILY 60 tablet 1  . celecoxib (CELEBREX) 200 MG capsule Take 200 mg by mouth every other day.   4  . cloNIDine (CATAPRES - DOSED IN MG/24 HR)  0.2 mg/24hr patch Place 1 patch (0.2 mg total) onto the skin once a week. 4 patch 12  . cloNIDine (CATAPRES) 0.1 MG tablet Take 0.1 mg by mouth 2 (two) times daily.    Marland Kitchen dexamethasone (DECADRON) 4 MG tablet Take 1 tablet (4 mg total) by mouth 2 (two) times daily with a meal. 30 tablet 0  . dicyclomine (BENTYL) 10 MG capsule Take 10 mg by mouth daily as needed for spasms.     Marland Kitchen enoxaparin (LOVENOX) 60 MG/0.6ML injection Inject 0.5 mLs (50 mg total) into the skin every 12 (twelve) hours. 30 Syringe 0  . fexofenadine (ALLEGRA ALLERGY) 180 MG tablet Take 180 mg by mouth daily.     . furosemide (LASIX) 40 MG tablet Take 1-2 tablets daily as directed. 45 tablet 6  . hydrOXYzine (ATARAX/VISTARIL) 25 MG tablet  Take 25 mg by mouth 3 (three) times daily as needed for anxiety.    Marland Kitchen levothyroxine (SYNTHROID, LEVOTHROID) 88 MCG tablet Take 1 tablet (88 mcg total) by mouth daily before breakfast. 90 tablet 3  . NITROSTAT 0.4 MG SL tablet Place 0.4 mg under the tongue every 5 (five) minutes as needed for chest pain (x 3 doses). Reported on 10/17/2015  1  . potassium chloride SA (K-DUR,KLOR-CON) 20 MEQ tablet Take 10 meq (1/2 tab) as directed. 15 tablet 6  . Probiotic Product (PROBIOTIC ADVANCED PO) Take 1 tablet by mouth daily. Reported on 10/17/2015    . simvastatin (ZOCOR) 20 MG tablet Take 10 mg by mouth daily. TAKE 1/2 TAB    . valsartan (DIOVAN) 320 MG tablet Take 1 tablet (320 mg total) by mouth daily. 30 tablet 5  . zaleplon (SONATA) 5 MG capsule Take 1 capsule (5 mg total) by mouth at bedtime as needed for sleep. 30 capsule 1   No current facility-administered medications for this visit.     Allergies:   Amiodarone; Chlorthalidone; Alendronate sodium; Macrodantin [nitrofurantoin]; Meclizine; Norvasc [amlodipine besylate]; Hydralazine hcl; and Tussionex pennkinetic er [hydrocod polst-cpm polst er]    Social History:  The patient  reports that she quit smoking about 27 years ago. Her smoking use included  Cigarettes. She has a 20.00 pack-year smoking history. She has never used smokeless tobacco. She reports that she drinks about 4.8 oz of alcohol per week . She reports that she does not use drugs.   Family History:  The patient's family history includes Breast cancer in her daughter; COPD in her brother; Colon cancer in her sister; Coronary artery disease in her father; Dementia in her mother; Heart attack in her father; Liver cancer in her sister; Lung cancer in her brother.    ROS:  Please see the history of present illness.   Otherwise, review of systems are positive for none.   All other systems are reviewed and negative.    PHYSICAL EXAM: VS:  BP 125/60   [name] 77   Ht 5' (1.524 m)   Wt 55.6 kg (122 lb 9.6 oz)   BMI 23.94 kg/m  , BMI Body mass index is 23.94 kg/m. GENERAL:  Well appearing HEENT:  Pupils equal round and reactive, fundi not visualized, oral mucosa unremarkable.  Conjuntival pallor.  NECK:  JVP at the clavicle at 45 degrees. Waveform within normal limits, carotid upstroke brisk and symmetric, no bruits LYMPHATICS:  No cervical adenopathy LUNGS:  Clear to auscultation bilaterally HEART:  Irregularly irregular. PMI not displaced or sustained,S1 within normal limits, mechanical S2, no S3, no S4, no clicks, no rubs, II/VI systolic murmur. ABD:  Distended, positive bowel sounds normal in frequency in pitch, no bruits, no rebound, no guarding, no midline pulsatile mass, no hepatomegaly, no splenomegaly EXT:  2 plus pulses throughout, no edema, no cyanosis no clubbing SKIN:  No rashes no nodules NEURO:  Cranial nerves II through XII grossly intact, motor grossly intact throughout PSYCH:  Cognitively intact, oriented to person place and time  EKG:  EKG is not ordered today. The ekg ordered 06/30/15 demonstrates atrial bigeminy.  Rate 75 bpm.  Prior septal infarct.  Lateral ST-T changes consistent with ischemia.   Echo 04/05/15: Study Conclusions  - Left ventricle: The  cavity size was normal. There was mild   concentric hypertrophy. Systolic function was normal. The   estimated ejection fraction was in the range of 50% to 55%. Wall   motion was  normal; there were no regional wall motion   abnormalities. The study is not technically sufficient to allow   evaluation of LV diastolic function. - Aortic valve: A mechanical prosthesis was present. The gradients   across the prosthesis are similar to the study of 2012. There was   mild regurgitation. Peak velocity (S): 325 cm/s. Mean gradient   (S): 21 mm Hg. - Mitral valve: There was moderate regurgitation. - Left atrium: The atrium was moderately to severely dilated. - Right ventricle: The cavity size was moderately dilated. Wall   thickness was normal. - Right atrium: The atrium was moderately to severely dilated. - Pulmonary arteries: Systolic pressure was mildly increased. PA   peak pressure: 36 mm Hg (S).   Recent Labs: 01/10/2016: B Natriuretic Peptide 353.2 01/15/2016: ALT 19; BUN 76.2; Creatinine 1.3; Potassium 4.5; Sodium 136 01/17/2016: HGB 8.0; Platelets 169    Lipid Panel    Component Value Date/Time   CHOL 153 11/08/2015 0959   TRIG 139 11/08/2015 0959   HDL 59 11/08/2015 0959   CHOLHDL 2.5 12/26/2014 0356   VLDL 19 12/26/2014 0356   LDLCALC 66 11/08/2015 0959   LDLDIRECT 93.7 04/10/2011 1000      Wt Readings from Last 3 Encounters:  01/24/16 55.6 kg (122 lb 9.6 oz)  01/16/16 55.8 kg (123 lb)  01/11/16 52.1 kg (114 lb 13.8 oz)      ASSESSMENT AND PLAN:  # Mediastinal mass: Deborah [name] was recently diagnosed with a mediastinal mass and is currently awaiting PET scanning.  It is encasing her right main PA, which is likely contributing to her shortness of breath.  We had a long discussion about her overall prognosis and goals. She is willing to take medications but would not want any surgeries or more aggressive interventions. She does not think she would want IV chemotherapy. She  reiterates that she has more interested in quality of life rather than quantity of life.  # Chronic diastolic heart failure:  Fluid status is tenuous.   She becomes overtly volume overloaded when either her blood pressure is poorly controlled or she receives IV infusions. She will take Lasix twice daily whenever she needs either blood or iron. Otherwise she will continue to take it once a day. When we had stopped her Lasix in the past due to acute renal failure she subsequently develops volume overloaded shortness of breath. We will likely need to tolerate mild renal dysfunction in order to keep her volume status ideal.  # Hypertension: Deborah [name] blood pressure is better-controlled and she has less dry mouth on the patch instead of clonidine tablets. Continue amlodipine and valsartan.   # Atrial fibrillation: Currently in atrial fibrillation and rates are well-controlled.  Continue carvedilol.  She is currently on lovenox Instead of warfarin, presumably a case she needs any biopsies or interventions. Her INR goal is 2-2.5 on warfarin.  This patients CHA2DS2-VASc Score and unadjusted Ischemic Stroke Rate (% per year) is equal to 4.8 % stroke rate/year from a score of 4  Above score calculated as 1 point each if present [CHF, HTN, DM, Vascular=MI/PAD/Aortic Plaque, Age if 65-74, or Female] Above score calculated as 2 points each if present [Age > 75, or Stroke/TIA/TE]   # s/p mechanical AVR: Stable with mildly increased gradients.  Mean gradient was 18 mmHg on echo 12/2015. INR goal 2-2.5 due to GI bleeding requiring transfusions.   # Hyperlipidemia: LDL 66 10/2015.  # Ascending arorta aneurysm: 4.7 cm on CT.  No  intervention.  We will determine the frequency of imaging once we have a better understanding of her overall prognosis and management of her malignancy.  # Insomnia: Deborah [name] has been taking Ambien for years and it is not working anymore. We will try Sonata '5mg'$  daily.     Current  medicines are reviewed at length with the patient today.  The patient does not have concerns regarding medicines.  The following changes have been made:  None  Labs/ tests ordered today include:   No orders of the defined types were placed in this encounter.  Time spent: 45 minutes-Greater than 50% of this time was spent in counseling, explanation of diagnosis, planning of further management, and coordination of care.   Disposition:   FU with Deborah Durrett C. Oval Linsey, MD, [facility] in 1 month   This note was written with the assistance of speech recognition software.  Please excuse any transcriptional errors.  Signed, Flavia Bruss C. Oval Linsey, MD, [facility]  01/24/2016 6:48 PM    [facility]

## 2016-01-24 NOTE — Patient Instructions (Signed)
Your physician has recommended you make the following change in your medication: STOP AMBIEN AND START SONATA '5MG'$  AT BEDTIME  Your physician recommends that you schedule a follow-up appointment in: [place] [address] [facility]  If you need a refill on your cardiac medications before your next appointment, please call your pharmacy.

## 2016-01-25 ENCOUNTER — Telehealth: Payer: Self-pay | Admitting: Emergency Medicine

## 2016-01-25 ENCOUNTER — Encounter (HOSPITAL_COMMUNITY)
Admission: RE | Admit: 2016-01-25 | Discharge: 2016-01-25 | Disposition: A | Discharge status: Home / Self Care | Payer: Medicare Other | Source: Ambulatory Visit | Attending: Emergency Medicine | Admitting: Emergency Medicine

## 2016-01-25 DIAGNOSIS — R918 Other nonspecific abnormal finding of lung field: Secondary | ICD-10-CM | POA: Diagnosis present

## 2016-01-25 LAB — GLUCOSE, CAPILLARY: Glucose-Capillary: 109 mg/dL — ABNORMAL HIGH (ref 65–99)

## 2016-01-25 MED ORDER — FLUDEOXYGLUCOSE F - 18 (FDG) INJECTION
5.8000 | Freq: Once | INTRAVENOUS | Status: AC | PRN
  Administered 2016-01-25: 5.8 via INTRAVENOUS

## 2016-01-25 NOTE — Telephone Encounter (Signed)
Attempted to call pt regarding her PET scan results. Shows expected hypermetabolism in the chest, a lower L ventral abdominal focus of hypermetabolism, a focus at the anorectal junction that did not have a CT correlate (unclear significance). LMOM. Will try her again.

## 2016-01-26 ENCOUNTER — Telehealth: Payer: Self-pay | Admitting: *Deleted

## 2016-01-26 ENCOUNTER — Other Ambulatory Visit: Payer: Self-pay | Admitting: *Deleted

## 2016-01-26 NOTE — Telephone Encounter (Signed)
Oncology Nurse Navigator Documentation  Oncology Nurse Navigator Flowsheets 01/26/2016  Navigator Location [facility]  Navigator Encounter Type Telephone  Telephone Outgoing Call/I called and scheduled patient to be seen with Dr. Julien Nordmann on 02/06/16 arrive at 2:00 with appt at 2:15.    Treatment Phase Pre-Tx/Tx Discussion  Barriers/Navigation Needs Coordination of Care  Interventions Coordination of Care  Coordination of Care Appts  Acuity Level 1  Acuity Level 1 Initial guidance, education and coordination as needed  Time Spent with Patient 30

## 2016-01-26 NOTE — Telephone Encounter (Signed)
Spoke with pt and reviewed the PET with her. The area of L abd hypermetabolism is likely in the area of her prior rectus sheath hematoma. The rectal-anal hypermetabolism does not appear to have a CT correlate. The best target may end up being the lung. If so then we will have to decide whether to do standard FOB vs ENB. I will review the scan with Dr Griffith Citron next week and then contact her to make a bx plan.

## 2016-01-26 NOTE — Telephone Encounter (Signed)
Patient called anxious to hear results. She can be reached at [phone] until 4pm after that she can be reached at [phone]

## 2016-01-27 ENCOUNTER — Encounter: Payer: Self-pay | Admitting: *Deleted

## 2016-01-27 NOTE — Patient Outreach (Signed)
Transition of care call. Pt is not feeling well. She is going through evaluation of her newly diagnosed lung mass and waiting to see the oncologist in 2 weeks for their recommendation on treatment. She denies wt gain but is SOB. She is on O2 constantly. She says she has to be caregul not to over extend herself. She reports she has lots of family support. She did not feel like talking any longer today. I will call her again next week.  Deloria Lair [facility] [phone]

## 2016-01-28 ENCOUNTER — Other Ambulatory Visit: Payer: Self-pay | Admitting: Oncology

## 2016-01-30 ENCOUNTER — Telehealth: Payer: Self-pay | Admitting: Emergency Medicine

## 2016-01-30 NOTE — Telephone Encounter (Signed)
I spoke with pt daughter who is very concerned about her mother's health and would like to discuss biopsy. The soonest appt that we have with RB is 02-21-16. Pt's daughter would like to get in sooner then 02-21-16 to discuss this with RB. Pt had an appt with Dr. Julien Nordmann on 02-06-16 but Deborah [name] feels that Dr. Julien Nordmann will not be able to discuss this with them as he will not have all details.  RB please advise if we can get pt in sooner. Thanks.

## 2016-01-31 ENCOUNTER — Other Ambulatory Visit (HOSPITAL_BASED_OUTPATIENT_CLINIC_OR_DEPARTMENT_OTHER): Payer: Medicare Other

## 2016-01-31 ENCOUNTER — Telehealth: Payer: Self-pay | Admitting: Oncology

## 2016-01-31 ENCOUNTER — Ambulatory Visit (HOSPITAL_COMMUNITY)
Admission: RE | Admit: 2016-01-31 | Discharge: 2016-01-31 | Disposition: A | Discharge status: Home / Self Care | Payer: Medicare Other | Source: Ambulatory Visit | Attending: Oncology | Admitting: Oncology

## 2016-01-31 ENCOUNTER — Other Ambulatory Visit: Payer: Self-pay | Admitting: *Deleted

## 2016-01-31 DIAGNOSIS — D689 Coagulation defect, unspecified: Secondary | ICD-10-CM

## 2016-01-31 DIAGNOSIS — D500 Iron deficiency anemia secondary to blood loss (chronic): Secondary | ICD-10-CM

## 2016-01-31 DIAGNOSIS — Z01812 Encounter for preprocedural laboratory examination: Secondary | ICD-10-CM | POA: Insufficient documentation

## 2016-01-31 DIAGNOSIS — D62 Acute posthemorrhagic anemia: Secondary | ICD-10-CM

## 2016-01-31 DIAGNOSIS — K922 Gastrointestinal hemorrhage, unspecified: Secondary | ICD-10-CM | POA: Diagnosis not present

## 2016-01-31 DIAGNOSIS — D649 Anemia, unspecified: Secondary | ICD-10-CM

## 2016-01-31 DIAGNOSIS — D6489 Other specified anemias: Secondary | ICD-10-CM | POA: Diagnosis not present

## 2016-01-31 DIAGNOSIS — I119 Hypertensive heart disease without heart failure: Secondary | ICD-10-CM

## 2016-01-31 DIAGNOSIS — K921 Melena: Secondary | ICD-10-CM

## 2016-01-31 DIAGNOSIS — I4819 Other persistent atrial fibrillation: Secondary | ICD-10-CM

## 2016-01-31 LAB — CBC WITH DIFFERENTIAL/PLATELET
BASO%: 0.5 % (ref 0.0–2.0)
Basophils Absolute: 0 10*3/uL (ref 0.0–0.1)
EOS ABS: 0 10*3/uL (ref 0.0–0.5)
EOS%: 0.8 % (ref 0.0–7.0)
HCT: 27.6 % — ABNORMAL LOW (ref 34.8–46.6)
HEMOGLOBIN: 8.8 g/dL — AB (ref 11.6–15.9)
LYMPH#: 0.4 10*3/uL — AB (ref 0.9–3.3)
LYMPH%: 11.4 % — AB (ref 14.0–49.7)
MCH: 32.1 pg (ref 25.1–34.0)
MCHC: 31.7 g/dL (ref 31.5–36.0)
MCV: 101.3 fL — AB (ref 79.5–101.0)
MONO#: 0.3 10*3/uL (ref 0.1–0.9)
MONO%: 8.4 % (ref 0.0–14.0)
NEUT%: 78.9 % — ABNORMAL HIGH (ref 38.4–76.8)
NEUTROS ABS: 2.8 10*3/uL (ref 1.5–6.5)
PLATELETS: 142 10*3/uL — AB (ref 145–400)
RBC: 2.73 10*6/uL — AB (ref 3.70–5.45)
RDW: 22.5 % — AB (ref 11.2–14.5)
WBC: 3.5 10*3/uL — AB (ref 3.9–10.3)

## 2016-01-31 LAB — PREPARE RBC (CROSSMATCH)

## 2016-01-31 LAB — FERRITIN: Ferritin: 280 ng/ml — ABNORMAL HIGH (ref 9–269)

## 2016-01-31 NOTE — Telephone Encounter (Signed)
Spoke with the patient and her daughter [name] by phone. I have recommended that we schedule EBUS / FOB under general anesthesia. I think this would actually be safer than conscious sedation given her hypotension in the past w CS. I would like for anesthesia to monitor her through the case and to be able to support her BP if needed during the case. They understand and agree pending discussion with her other physicians. I have spoken w Dr Griffith Citron, will message Dr Oval Linsey w Cardiology today. For now will schedule as an EBUS under general anesthesia. Will give her instructions about when to hold her enoxaparin. We can cancel if any barriers arise.

## 2016-01-31 NOTE — Telephone Encounter (Signed)
sw pt to confirm 11/2 appt at [facility] at 9 am per LOS

## 2016-01-31 NOTE — Telephone Encounter (Signed)
RB please advise of appt. thanks

## 2016-01-31 NOTE — Telephone Encounter (Signed)
Pt daughter calling to check on rhe status of getting an eariler appoint please advise.Hillery Hunter

## 2016-01-31 NOTE — Telephone Encounter (Signed)
I spoke with them, see other phone note

## 2016-02-01 ENCOUNTER — Telehealth: Payer: Self-pay | Admitting: *Deleted

## 2016-02-01 ENCOUNTER — Ambulatory Visit (HOSPITAL_COMMUNITY)
Admission: RE | Admit: 2016-02-01 | Discharge: 2016-02-01 | Disposition: A | Discharge status: Home / Self Care | Payer: Medicare Other | Source: Ambulatory Visit | Attending: Oncology | Admitting: Oncology

## 2016-02-01 DIAGNOSIS — Z01812 Encounter for preprocedural laboratory examination: Secondary | ICD-10-CM | POA: Diagnosis not present

## 2016-02-01 DIAGNOSIS — D649 Anemia, unspecified: Secondary | ICD-10-CM

## 2016-02-01 MED ORDER — SODIUM CHLORIDE 0.9 % IV SOLN
250.0000 mL | Freq: Once | INTRAVENOUS | Status: AC
  Administered 2016-02-01: 250 mL via INTRAVENOUS

## 2016-02-01 MED ORDER — DIPHENHYDRAMINE HCL 50 MG/ML IJ SOLN: 25.0000 mg | Freq: Once | INTRAMUSCULAR | Status: AC

## 2016-02-01 MED ORDER — ACETAMINOPHEN 325 MG PO TABS
650.0000 mg | ORAL_TABLET | Freq: Once | ORAL | Status: AC
  Administered 2016-02-01: 650 mg via ORAL
  Filled 2016-02-01: qty 2

## 2016-02-01 MED ORDER — DIPHENHYDRAMINE HCL 25 MG PO CAPS
25.0000 mg | ORAL_CAPSULE | Freq: Once | ORAL | Status: AC
  Administered 2016-02-01: 25 mg via ORAL
  Filled 2016-02-01: qty 1

## 2016-02-01 MED ORDER — DIPHENHYDRAMINE HCL 25 MG PO CAPS: 25.0000 mg | ORAL_CAPSULE | Freq: Once | ORAL | Status: DC

## 2016-02-01 MED ORDER — SODIUM CHLORIDE 0.9% FLUSH
10.0000 mL | INTRAVENOUS | Status: AC | PRN
  Administered 2016-02-01: 10 mL

## 2016-02-01 MED ORDER — SODIUM CHLORIDE 0.9% FLUSH: 3.0000 mL | INTRAVENOUS | Status: DC | PRN

## 2016-02-01 MED ORDER — HEPARIN SOD (PORK) LOCK FLUSH 100 UNIT/ML IV SOLN
500.0000 [IU] | Freq: Every day | INTRAVENOUS | Status: AC | PRN
Start: 2016-02-01 — End: 2016-02-01
  Administered 2016-02-01: 500 [IU]
  Filled 2016-02-01: qty 5

## 2016-02-01 MED ORDER — DIPHENHYDRAMINE HCL 50 MG/ML IJ SOLN: 25.0000 mg | Freq: Once | INTRAMUSCULAR | Status: DC

## 2016-02-01 MED ORDER — HEPARIN SOD (PORK) LOCK FLUSH 100 UNIT/ML IV SOLN: 250.0000 [IU] | INTRAVENOUS | Status: DC | PRN

## 2016-02-01 NOTE — Telephone Encounter (Signed)
Oncology Nurse Navigator Documentation  Oncology Nurse Navigator Flowsheets 02/01/2016  Navigator Location [facility]  Navigator Encounter Type Telephone  Telephone Outgoing Call/Val, RN spoke to me and updated me that Ms. Keahey needed me to call.  I called and left a vm message to call with my name and phone number.   Treatment Phase Pre-Tx/Tx Discussion  Barriers/Navigation Needs Education  Education Other  Interventions Education  Acuity Level 1  Time Spent with Patient 15

## 2016-02-01 NOTE — Discharge Instructions (Signed)
Blood Transfusion, Care After  These instructions give you information about caring for yourself after your procedure. Your doctor may also give you more specific instructions. Call your doctor if you have any problems or questions after your procedure.   HOME CARE   Take medicines only as told by your doctor. Ask your doctor if you can take an over-the-counter pain reliever if you have a fever or headache a day or two after your procedure.   Return to your normal activities as told by your doctor.  GET HELP IF:    You develop redness or irritation at your IV site.   You have a fever, chills, or a headache that does not go away.   Your pee (urine) is darker than normal.   Your urine turns:    Pink.    Red.    Brown.   The white part of your eye turns yellow (jaundice).   You feel weak after doing your normal activities.  GET HELP RIGHT AWAY IF:    You have trouble breathing.   You have fever and chills and you also have:    Anxiety.    Chest or back pain.    Flushed or pink skin.    Clammy or sweaty skin.    A fast heartbeat.    A sick feeling in your stomach (nausea).     This information is not intended to replace advice given to you by your health care provider. Make sure you discuss any questions you have with your health care provider.     Document Released: 04/08/2014 Document Reviewed: 04/08/2014  Elsevier Interactive Patient Education 2016 [employer]

## 2016-02-01 NOTE — Progress Notes (Signed)
Provider; [name], MD  Associated Diagnosis: Symptomatic Anemia  Procedure: Transfusion of 1 pack of platelets Patient tolerated transfusion well. No reaction.  Discharge instructions  given to patient. Pt was alert,oriented and amubulatory at discharge,D/C with daughter.

## 2016-02-02 ENCOUNTER — Other Ambulatory Visit: Payer: Self-pay | Admitting: *Deleted

## 2016-02-02 LAB — TYPE AND SCREEN
ABO/RH(D): O POS
ANTIBODY SCREEN: NEGATIVE
UNIT DIVISION: 0
UNIT DIVISION: 0

## 2016-02-02 NOTE — Patient Outreach (Signed)
Transition of care call. I only spoke briefly with Deborah [name] today. She reports she hasn't been feeling well. She has been going to many MD appts. She is going to find out next week what proposed treatment she will have for her lung mass. She has good support at home. She said she doesn't feel like talking and her lunch is ready. I told her I wish her well and I will check in on her again next week.  Deborah [name] [facility] [phone]

## 2016-02-03 ENCOUNTER — Other Ambulatory Visit: Payer: Self-pay | Admitting: Oncology

## 2016-02-05 ENCOUNTER — Telehealth: Payer: Self-pay | Admitting: Emergency Medicine

## 2016-02-05 ENCOUNTER — Telehealth: Payer: Self-pay

## 2016-02-05 ENCOUNTER — Encounter (HOSPITAL_COMMUNITY)
Admission: RE | Admit: 2016-02-05 | Discharge: 2016-02-05 | Disposition: A | Discharge status: Home / Self Care | Payer: Medicare Other | Source: Ambulatory Visit | Attending: Emergency Medicine | Admitting: Emergency Medicine

## 2016-02-05 ENCOUNTER — Encounter (HOSPITAL_COMMUNITY): Payer: Self-pay

## 2016-02-05 ENCOUNTER — Encounter: Payer: Self-pay | Admitting: Internal Medicine

## 2016-02-05 DIAGNOSIS — Z923 Personal history of irradiation: Secondary | ICD-10-CM | POA: Diagnosis not present

## 2016-02-05 DIAGNOSIS — E785 Hyperlipidemia, unspecified: Secondary | ICD-10-CM | POA: Diagnosis not present

## 2016-02-05 DIAGNOSIS — K922 Gastrointestinal hemorrhage, unspecified: Secondary | ICD-10-CM | POA: Diagnosis not present

## 2016-02-05 DIAGNOSIS — M1990 Unspecified osteoarthritis, unspecified site: Secondary | ICD-10-CM | POA: Diagnosis not present

## 2016-02-05 DIAGNOSIS — I110 Hypertensive heart disease with heart failure: Secondary | ICD-10-CM | POA: Diagnosis not present

## 2016-02-05 DIAGNOSIS — Z9221 Personal history of antineoplastic chemotherapy: Secondary | ICD-10-CM | POA: Diagnosis not present

## 2016-02-05 DIAGNOSIS — M109 Gout, unspecified: Secondary | ICD-10-CM | POA: Diagnosis not present

## 2016-02-05 DIAGNOSIS — Z7901 Long term (current) use of anticoagulants: Secondary | ICD-10-CM | POA: Diagnosis not present

## 2016-02-05 DIAGNOSIS — E039 Hypothyroidism, unspecified: Secondary | ICD-10-CM | POA: Diagnosis not present

## 2016-02-05 DIAGNOSIS — Z9842 Cataract extraction status, left eye: Secondary | ICD-10-CM | POA: Diagnosis not present

## 2016-02-05 DIAGNOSIS — K219 Gastro-esophageal reflux disease without esophagitis: Secondary | ICD-10-CM | POA: Diagnosis not present

## 2016-02-05 DIAGNOSIS — I482 Chronic atrial fibrillation: Secondary | ICD-10-CM | POA: Diagnosis not present

## 2016-02-05 DIAGNOSIS — Z853 Personal history of malignant neoplasm of breast: Secondary | ICD-10-CM | POA: Diagnosis present

## 2016-02-05 DIAGNOSIS — Z9981 Dependence on supplemental oxygen: Secondary | ICD-10-CM | POA: Diagnosis not present

## 2016-02-05 DIAGNOSIS — I509 Heart failure, unspecified: Secondary | ICD-10-CM | POA: Diagnosis not present

## 2016-02-05 DIAGNOSIS — Z90710 Acquired absence of both cervix and uterus: Secondary | ICD-10-CM | POA: Diagnosis not present

## 2016-02-05 DIAGNOSIS — C3401 Malignant neoplasm of right main bronchus: Secondary | ICD-10-CM | POA: Diagnosis not present

## 2016-02-05 DIAGNOSIS — Z87891 Personal history of nicotine dependence: Secondary | ICD-10-CM | POA: Diagnosis not present

## 2016-02-05 DIAGNOSIS — E7800 Pure hypercholesterolemia, unspecified: Secondary | ICD-10-CM | POA: Diagnosis not present

## 2016-02-05 DIAGNOSIS — Q2733 Arteriovenous malformation of digestive system vessel: Secondary | ICD-10-CM | POA: Diagnosis not present

## 2016-02-05 DIAGNOSIS — Z952 Presence of prosthetic heart valve: Secondary | ICD-10-CM | POA: Diagnosis not present

## 2016-02-05 DIAGNOSIS — Z8673 Personal history of transient ischemic attack (TIA), and cerebral infarction without residual deficits: Secondary | ICD-10-CM | POA: Diagnosis not present

## 2016-02-05 DIAGNOSIS — R011 Cardiac murmur, unspecified: Secondary | ICD-10-CM | POA: Diagnosis not present

## 2016-02-05 DIAGNOSIS — D649 Anemia, unspecified: Secondary | ICD-10-CM | POA: Diagnosis not present

## 2016-02-05 HISTORY — DX: Dependence on supplemental oxygen: Z99.81

## 2016-02-05 LAB — CBC
HCT: 29.7 % — ABNORMAL LOW (ref 36.0–46.0)
HEMOGLOBIN: 9.3 g/dL — AB (ref 12.0–15.0)
MCH: 32 pg (ref 26.0–34.0)
MCHC: 31.3 g/dL (ref 30.0–36.0)
MCV: 102.1 fL — AB (ref 78.0–100.0)
PLATELETS: 182 10*3/uL (ref 150–400)
RBC: 2.91 MIL/uL — AB (ref 3.87–5.11)
RDW: 19 % — ABNORMAL HIGH (ref 11.5–15.5)
WBC: 3.9 10*3/uL — ABNORMAL LOW (ref 4.0–10.5)

## 2016-02-05 LAB — COMPREHENSIVE METABOLIC PANEL
ALT: 22 U/L (ref 14–54)
AST: 24 U/L (ref 15–41)
Albumin: 3.5 g/dL (ref 3.5–5.0)
Alkaline Phosphatase: 40 U/L (ref 38–126)
Anion gap: 8 (ref 5–15)
BUN: 41 mg/dL — ABNORMAL HIGH (ref 6–20)
CHLORIDE: 96 mmol/L — AB (ref 101–111)
CO2: 28 mmol/L (ref 22–32)
CREATININE: 1.16 mg/dL — AB (ref 0.44–1.00)
Calcium: 9.7 mg/dL (ref 8.9–10.3)
GFR calc non Af Amer: 41 mL/min — ABNORMAL LOW (ref >60)
GFR, EST AFRICAN AMERICAN: 48 mL/min — AB (ref >60)
Glucose, Bld: 113 mg/dL — ABNORMAL HIGH (ref 65–99)
Potassium: 4.5 mmol/L (ref 3.5–5.1)
SODIUM: 132 mmol/L — AB (ref 135–145)
Total Bilirubin: 0.4 mg/dL (ref 0.3–1.2)
Total Protein: 5.2 g/dL — ABNORMAL LOW (ref 6.5–8.1)

## 2016-02-05 NOTE — Telephone Encounter (Signed)
Returned call -transferred to nurse vm.

## 2016-02-05 NOTE — Progress Notes (Signed)
Pt denies chest pain but stated that she uses 2 liters of oxygen at night and PRN and is under the care of Dr. Skeet Latch, Cardiology. Pt stated that she was instructed to take her last dose of Loveox tomorrow, Tuesday morning. MD stated that he would enter orders ( per SS). Spoke with Janett Billow, CMA, to make MD aware of abnormal labs ( hemoglobin 9.3 and hematocrit 29.7. Pt requesting  Dr. Jenita Seashore, Anesthesia DOS. Pt chart forwarded to anesthesia to review cardiac history and abnormal labs.

## 2016-02-05 NOTE — Telephone Encounter (Signed)
Please inform the patient that she should not take her enoxaparin injection on the evening before her procedure (11/7) or on the morning of her procedure (11/8). Thanks very much

## 2016-02-05 NOTE — Telephone Encounter (Signed)
Chart reviewed.

## 2016-02-05 NOTE — Telephone Encounter (Signed)
This was opened on wrong patient.

## 2016-02-05 NOTE — Telephone Encounter (Signed)
This encounter was created in error - please disregard.

## 2016-02-05 NOTE — Pre-Procedure Instructions (Signed)
Deborah [name]  02/05/2016      [facility] - [place], [place] - [address] [place] [address] Phone: [phone] Fax: [phone]    Your procedure is scheduled on  Wednesday, February 07, 2016  Report to [facility] Admitting at 8:30 A.M.  Call this number if you have problems the morning of surgery:  [phone]   Remember:  Do not eat food or drink liquids after midnight. Tuesday, Nov 7  Take these medicines the morning of surgery with A SIP OF WATER : carvedilol (COREG),  cloNIDine (CATAPRES), levothyroxine (SYNTHROID), fexofenadine (ALLEGRA ALLERGY)  if needed: Tylenol for pain, Nitroglycerin for chest pain, hydrOXYzine (ATARAX/VISTARIL) for anxiety  Stop taking Aspirin, vitamins, fish oil and herbal medications ( Probiotic). Do not take any NSAIDs ie: Ibuprofen, Advil, Naproxen, BC and Goody powder or any medication containing Aspirin such as Celebrex.; stop now. .A  Do not wear jewelry, make-up or nail polish.  Do not wear lotions, powders, or perfumes, or deoderant.  Do not shave 48 hours prior to surgery.  .  Do not bring valuables to the hospital.  [facility] is not responsible for any belongings or valuables.  Contacts, dentures or bridgework may not be worn into surgery.  Leave your suitcase in the car.  After surgery it may be brought to your room.  For patients admitted to the hospital, discharge time will be determined by your treatment team.  Patients discharged the day of surgery will not be allowed to drive home. Special instructions:   [facility] - Preparing for Surgery  Before surgery, you can play an important role.  Because skin is not sterile, your skin needs to be as free of germs as possible.  You can reduce the number of germs on you skin by washing with CHG (chlorahexidine gluconate) soap before surgery.  CHG is an antiseptic cleaner which kills germs and bonds with the skin to continue killing germs even  after washing.  Please DO NOT use if you have an allergy to CHG or antibacterial soaps.  If your skin becomes reddened/irritated stop using the CHG and inform your nurse when you arrive at Short Stay.  Do not shave (including legs and underarms) for at least 48 hours prior to the first CHG shower.  You may shave your face.  Please follow these instructions carefully:   1.  Shower with CHG Soap the night before surgery and the morning of Surgery.  2.  If you choose to wash your hair, wash your hair first as usual with your normal shampoo.  3.  After you shampoo, rinse your hair and body thoroughly to remove the Shampoo.  4.  Use CHG as you would any other liquid soap.  You can apply chg directly  to the skin and wash gently with scrungie or a clean washcloth.  5.  Apply the CHG Soap to your body ONLY FROM THE NECK DOWN.  Do not use on open wounds or open sores.  Avoid contact with your eyes, ears, mouth and genitals (private parts).  Wash genitals (private parts) with your normal soap.  6.  Wash thoroughly, paying special attention to the area where your surgery will be performed.  7.  Thoroughly rinse your body with warm water from the neck down.  8.  DO NOT shower/wash with your normal soap after using and rinsing off the CHG Soap.  9.  Pat yourself dry with a clean  towel.            10.  Wear clean pajamas.            11.  Place clean sheets on your bed the night of your first shower and do not sleep with pets.  Day of Surgery  Do not apply any lotions/deoderants the morning of surgery.  Please wear clean clothes to the hospital/surgery center.   Please read over the following fact sheets that you were given. Pain Booklet, Coughing and Deep Breathing and Surgical Site Infection Prevention

## 2016-02-05 NOTE — Progress Notes (Signed)
Dr. Tanna Furry office was give the wrong pt MRN number; spoke with Ovid Curd and updated with correct pt.

## 2016-02-05 NOTE — Telephone Encounter (Signed)
Ester, RN at pre-admission testing would like to have Dr. Lovena Le put in the orders for the testing.  Patient will be seen in pre-admission testing tomorrow morning.

## 2016-02-05 NOTE — Telephone Encounter (Signed)
I returned call to Deborah [name] re: Hgb.  She reports she still feels bad.   She wants to be sure Dr. Jana Hakim will be able to see lab results from her pre-procedure work to be done today at 2 pm.  I advised Deborah [name] I would watch for the labs and call her with further instructions from Dr. Jana Hakim.  She voiced understanding.

## 2016-02-06 ENCOUNTER — Ambulatory Visit: Payer: Medicare Other | Admitting: Cardiovascular Disease

## 2016-02-06 ENCOUNTER — Ambulatory Visit (HOSPITAL_BASED_OUTPATIENT_CLINIC_OR_DEPARTMENT_OTHER): Payer: Medicare Other | Admitting: Internal Medicine

## 2016-02-06 ENCOUNTER — Telehealth: Payer: Self-pay | Admitting: *Deleted

## 2016-02-06 ENCOUNTER — Encounter: Payer: Self-pay | Admitting: Internal Medicine

## 2016-02-06 DIAGNOSIS — R591 Generalized enlarged lymph nodes: Secondary | ICD-10-CM | POA: Diagnosis not present

## 2016-02-06 DIAGNOSIS — C3491 Malignant neoplasm of unspecified part of right bronchus or lung: Secondary | ICD-10-CM

## 2016-02-06 DIAGNOSIS — E279 Disorder of adrenal gland, unspecified: Secondary | ICD-10-CM | POA: Diagnosis not present

## 2016-02-06 DIAGNOSIS — R911 Solitary pulmonary nodule: Secondary | ICD-10-CM

## 2016-02-06 NOTE — Telephone Encounter (Signed)
Pt aware of RB recommendations. Pt voiced understanding and had no further questions. Nothing further needed.

## 2016-02-06 NOTE — Progress Notes (Addendum)
Anesthesia chart review: Patient is an 82 year old female scheduled for video bronchoscopy with endobronchial ultrasound on 02/07/2016 by Dr. Lamonte Sakai. She was hospitalized 01/10/16-01/11/16 with acute respiratory failure with hypoxia and bilateral pleural effusion, acute on chronic CHF and was found to have a new RUL lung mass that encases and narrows the distal right main pulmonary artery. PET scan was worrisome for stage IV primary lung CA. Biopsy is needed for definitive diagnosis. On 01/31/16, Dr. Lamonte Sakai wrote, "... EBUS / FOB under general anesthesia. I think this would actually be safer than conscious sedation given her hypotension in the past w CS. I would like for anesthesia to monitor her through the case and to be able to support her BP if needed during the case.Marland KitchenMarland KitchenI have spoken w Dr Griffith Citron, will message Dr Oval Linsey w Cardiology today. For now will schedule as an EBUS under general anesthesia. Will give her instructions about when to hold her enoxaparin. 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 For anesthesia history she reports hypotension and difficulty sleeping.   - PCP is Dr. Lajean Manes. - HEM-ONC is Dr. Jana Hakim. She is also scheduled to see Dr. Donne Anon this afternoon. - GI is Dr. Watt Climes. - Cardiologist is Dr. Skeet Latch, last visit 01/24/16.  Meds include Lovenox (last dose before surgery 02/06/16  AM; transitioned from warfarin 10/11/7 to facilitate future biopsy), allopurinol, amlodipine, Coreg, clonidine, dexamethasone (starated 01/11/16), Bentyl, Lasix, hydroxyzine PRN anxiety, levothyroxine, nitroglycerin, KCl, Sonata, valsartan, simvastatin, Allegra.   BP 132/64   Pulse 82   Temp 36.6 C   Resp 18   Ht 5' (1.524 m)   Wt 122 lb 6.4 oz (55.5 kg)   SpO2 100%   BMI 23.90 kg/m    01/10/16 EKG: Read as junctional tachycardia (although suspect this is likely afib with RVR at 105 bpm considering patient with known chronic afib), probable anterior infarct (age undetermined), repolarization abnormality, consider global ischemia. Rate is faster, otherwise not significantly changed when compared to 12/27/15 tracing. HR was 82 bpm at PAT.   12/12/15 Echo: Study Conclusions - Left ventricle: The cavity size was normal. Wall thickness was   increased in a pattern of moderate LVH. Systolic function was   normal. The estimated ejection fraction was in the range of 55%   to 60%. Wall motion was normal; there were no regional wall   motion abnormalities. Doppler parameters are consistent with high   ventricular filling pressure. - Aortic valve: A mechanical prosthesis was present. There was   trivial regurgitation. Valve area (VTI): 0.96 cm^2. Valve area   (Vmax): 0.85 cm^2. Valve area (Vmean): 0.8 cm^2. - Mitral valve: Calcified annulus. There was mild to moderate   regurgitation. - Left atrium: The atrium was severely dilated. - Right atrium: The atrium was mildly dilated. - Tricuspid valve: There was moderate regurgitation. - Pulmonary arteries: Systolic pressure was mildly to moderately   increased. PA peak pressure: 42 mm Hg (S). Impressions: - Normal LV systolic function; moderate LVH; elevated LV filling   pressure; severe LAE; mild RAE;  mild RVE; prosthetic aortic valve   with mean gradient 18 mmHg and trace AI; mild to moderate MR;   moderate TR with mild to moderate pulmonary  hypertension;   compared to 04/05/15, no significant change.  04/05/15 Nuclear stress test:  Nuclear stress EF: 59%.  There was no ST segment deviation noted during stress.  The study is normal.  This is a low risk study.  The left ventricular ejection fraction is normal (55-65%).  12/25/14 Carotid U/S: Summary: - The vertebral arteries appear patent with antegrade flow. - Findings consistent with 1- 39 percent stenosis involving the   right internal carotid artery and the left internal carotid   artery.  01/25/16 PET Scan: IMPRESSION: 1. Hypermetabolic central right upper lobe 4.1 cm lung mass, which is confluent with hypermetabolic right hilar and right paratracheal adenopathy. Hypermetabolic left adrenal nodule. Favor stage IV (T2b N2 M1b) primary bronchogenic carcinoma. 2. Possible small soft tissue metastasis in the lateral left lower ventral abdominal muscle wall. 3. Nonspecific focal hypermetabolism at the anorectal junction without definite CT correlate, favor physiologic uptake, although a neoplasm cannot be excluded and clinical correlation is necessary. 4. Mild patchy tree-in-bud opacity in the anterior left upper lobe with associated low level metabolism, favor mild infectious or inflammatory bronchiolitis. 5. Postobstructive pneumonitis in the right upper lobe. 6. Cardiomegaly. Small layering right pleural effusion. No appreciable pleural nodularity or hypermetabolism . 7. Duplicated SVC. Aberrant right subclavian artery. Aortic atherosclerosis. Stable ascending thoracic aortic aneurysm, maximum diameter 4.8 cm. Stable prominently dilated main pulmonary artery suggesting chronic pulmonary arterial hypertension. 8. Polycystic kidneys.  01/10/16 CTA Chest: IMPRESSION: 1. No definite CT evidence for acute pulmonary embolus, however right upper lobe pulmonary arterial evaluation is slightly limited by the presence of an encasing lung mass. Enlarged  pulmonary arterial trunk, correlate clinically for pulmonary hypertension. 2. Large right hilar soft tissue mass with suspected invasion of the mediastinum. The mass encases and narrows the distal right main pulmonary artery and branch vessels of the right upper lobe. Multiple suspicious enlarged mediastinal lymph nodes are also present. Findings could be secondary to metastatic disease or primary lung carcinoma. 3. Small right greater than left bilateral pleural effusions. Bilateral emphysematous disease. Additional scattered nodular densities in the anterior left upper lobe could relate to infectious or inflammatory process with metastatic disease not excluded. 4. Marked cardiomegaly. Dense atherosclerotic vascular disease of the aorta. 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 5. Aneurysmal dilatation of the ascending aorta. Ascending thoracic aortic aneurysm. Recommend semi-annual imaging followup by CTA or MRA and referral to cardiothoracic surgery if not already obtained. This recommendation follows 2010 ACCF/AHA/AATS/ACR/ASA/SCA/SCAI/SIR/STS/SVM Guidelines for the Diagnosis and Management of Patients With Thoracic Aortic Disease. Circulation. 2010; 121: D974-B638  Preoperative labs noted. Cr 1.16. H/H 9.3/29.7, up from 8.8/27.6 on 01/31/16 (s/p 1 Unit PRBC 02/01/16). Abnormal labs already called to Dr. Agustina Caroli staff. Plan PT/PTT and T&S on arrival tomorrow. Defer decision for transfusion to her surgeon and/or anesthesiologist.   Further evaluation by her anesthesiologist and surgeon on arrival to ensure no acute changes prior to proceeding. (She requested Dr. Jenita Seashore as her anesthesiologist if available, but I don't see that she is at [facility] that day. I will write on the anesthesiologist communication board just in case.)  George Hugh [facility] Short Stay Center/Anesthesiology Phone [phone] 02/06/2016 10:32 AM

## 2016-02-06 NOTE — Telephone Encounter (Signed)
Attempted to call pt. Home number was busy and cell phone number did not have voicemail. Will try back.

## 2016-02-06 NOTE — Telephone Encounter (Signed)
lmtcb x1 for pt. 

## 2016-02-06 NOTE — Progress Notes (Signed)
[facility] [phone]   [phone]  CONSULT NOTE  REFERRING PHYSICIAN: Dr. Gunnar Bulla Magrinat  REASON FOR CONSULTATION:  82 years old white female with highly suspicious lung cancer.  HPI Deborah [name] is a 82 y.o. female with past medical history significant for congestive heart failure, osteoarthritis, GERD, gout, dyslipidemia, hypertension, hypothyroidism, atrial fibrillation, TIA as well as history of right breast carcinoma diagnosed in 1994 status post lumpectomy followed by radiotherapy and adjuvant chemotherapy as well as hormonal therapy under the care of Dr. Jana Hakim. The patient has been doing fine and was seen earlier last month by Dr. Jana Hakim for routine evaluation of anemia. She was complaining of shortness of breath after PRBCs transfusion and the patient was seen at the emergency department for further evaluation. She had CT angiogram of the chest performed on 01/10/2016 and it showed no definite evidence for acute pulmonary embolism. There was a large right hilar soft tissue mass measuring 4.2 x 3.6 x 5.2 cm. There was suspected invasion of the mediastinum by the mass with possible contiguity of the mass with mediastinal adenopathy. The mass encases the right sided pulmonary artery and segmental branches of the right upper lobe. There was also multiple enlargement necrotic lymph nodes within the mediastinum, including and right pretracheal lymph node measuring 2.7 x 2.0 cm. Inferior to this there was an additional enlarged lymph node measuring 2.7 x 2.5 cm. There was also oval hypodense lesion to the left of the pulmonary artery trunk suspicious for additional enlarged lymph node measuring 2.6 x 2.0. This scan also showed bilateral pleural effusions small on the left and a small to moderate on the right. The patient underwent right-sided thoracentesis by pulmonary medicine with drainage of 550 mL of clear yellow fluid. The final cytology was not  conclusive for malignancy. A PET scan was performed on 01/25/2016 and it showed hypermetabolic central right upper lobe 4.1 cm lung mass which is confluent with hypermetabolic right hilar and right peritracheal adenopathy. There was also hypermetabolic left adrenal nodule suspicious for metastatic disease and the imaging studies favor stage IV (T2b, N2, M1b) primary bronchogenic carcinoma. There was also postobstructive pneumonitis in the right upper lobe. The patient is scheduled to have bronchoscopy under the care of Dr. Lamonte Sakai tomorrow. She was referred to me today for evaluation and discussion of her options before the bronchoscopy. When seen today she is feeling fine except for the shortness of breath and she is currently on home oxygen as well as intermittent heaviness in her chest. She has cough likely secondary to postnasal drainage but no hemoptysis. She denied having any significant weight loss or night sweats. She has no headache or visual changes. She has history of black tarry stool and she has a history of bleeding colon polyps currently followed by DR. Magod.  Family history significant for father died from heart disease, sister had colon cancer and brother had lung cancer. The patient is a widow and has 3 children. She was accompanied today by her son Mortimer Fries, daughter [name] and son-in-law Harrie Jeans. She also has a daughter named Gay Filler. She was a homemaker. She has a history of smoking less than one pack per day for around 30 years but quit 4 years ago. She drinks wine on daily basis and no history of drug abuse.    HPI  Past Medical History:  Diagnosis Date  . Acute CHF (congestive heart failure) ([place]) 01/10/2016  . Anemia   . Arthritis    "  fingers, toes, hips; qwhere" (05/19/2013)  . Breast cancer ([place])    "right; S/P lumpectomy, chemo, XRT"   . Chronic lower GI bleeding   . Complication of anesthesia    "once I'm awakened, I don't sleep til the following night" BP drops very low had  to stop putting in [place] -a- cath  . GERD (gastroesophageal reflux disease)   . Gout   . Heart murmur   . High cholesterol   . History of blood transfusion 1996; ~ 2013   "related to valve replacement; GI bleeding"   . History of echocardiogram    Echo 1/17: mild LVH, EF 50-55%, no RWMA, mild AI, mechanical AVR with mean 21 mmHg/peak 42 mmHg (stable since 2012), mod MR, mod to severe BAE, PASP 36 mmHg  . History of nuclear stress test    Myoview 1/17: EF 59%, normal perfusion, low risk  . Hypertension   . Hypothyroidism   . On supplemental oxygen therapy   . Permanent atrial fibrillation ([place])       . Pneumonia 07/2011  . Rectus sheath hematoma 12/19/14    held anticoagulation for 1 week, transfused  . Stomach problems    seeing Dr Watt Climes  . TIA (transient ischemic attack) 12/24/14   symptoms resolved    Past Surgical History:  Procedure Laterality Date  . ABDOMINAL HYSTERECTOMY    . AORTIC VALVE REPLACEMENT  1996   [employer]  . APPENDECTOMY    . arteriogram  01/12   NO RAS   . BREAST BIOPSY Right   . BREAST LUMPECTOMY Right   . CARDIAC CATHETERIZATION    . CARDIOVERSION     X 2  . CATARACT EXTRACTION W/ INTRAOCULAR LENS  IMPLANT, BILATERAL Bilateral    bilateral cataract with lens implants  . CHOLECYSTECTOMY    . COLONOSCOPY WITH PROPOFOL N/A 11/24/2012   Procedure: COLONOSCOPY WITH PROPOFOL;  Surgeon: Jeryl Columbia, MD;  Location: [facility] ENDOSCOPY;  Service: Endoscopy;  Laterality: N/A;  . ESOPHAGOGASTRODUODENOSCOPY (EGD) WITH PROPOFOL N/A 07/25/2015   Procedure: ESOPHAGOGASTRODUODENOSCOPY (EGD) WITH PROPOFOL;  Surgeon: Clarene Essex, MD;  Location: [facility] ENDOSCOPY;  Service: Endoscopy;  Laterality: N/A;  . HOT HEMOSTASIS N/A 07/25/2015   Procedure: HOT HEMOSTASIS (ARGON PLASMA COAGULATION/BICAP);  Surgeon: Clarene Essex, MD;  Location: [facility] ENDOSCOPY;  Service: Endoscopy;  Laterality: N/A;    Family History  Problem Relation Age of Onset  . Dementia Mother   . Coronary artery disease  Father   . Heart attack Father   . Breast cancer Daughter   . Colon cancer Sister   . Liver cancer Sister   . Lung cancer Brother   . COPD Brother     Social History Social History  Substance Use Topics  . Smoking status: Former Smoker    Packs/day: 1.00    Years: 20.00    Types: Cigarettes    Quit date: 04/01/1978  . Smokeless tobacco: Never Used  . Alcohol use 4.8 oz/week    4 Glasses of wine, 4 Shots of liquor per week     Comment: burbon or glass wine daily    Allergies  Allergen Reactions  . Alendronate Sodium Other (See Comments)    GI PAIN CAUTION: RISK FOR PERFORATION  . Amiodarone Shortness Of Breath  . Chlorthalidone Shortness Of Breath    ABDOMINAL BLOATING  . Macrodantin [Nitrofurantoin] Nausea And Vomiting and Other (See Comments)    CONTRAINDICATED WITH GFR < 50  . Meclizine Swelling    SWELLING OF TONGUE  .  Norvasc [Amlodipine Besylate] Swelling    SWELLING REACTION UNSPECIFIED   . Tussionex Pennkinetic Er [Hydrocod Polst-Cpm Polst Er] Other (See Comments)    UNSPECIFIED REACTION   . Hydralazine Hcl Nausea Only    Current Outpatient Prescriptions  Medication Sig Dispense Refill  . allopurinol (ZYLOPRIM) 300 MG tablet Take 300 mg by mouth at bedtime.     Marland Kitchen amLODipine (NORVASC) 5 MG tablet Take 5 mg by mouth at bedtime.    . calcium-vitamin D 500 MG tablet Take 1 tablet by mouth 2 (two) times daily. Reported on 09/26/2015    . carvedilol (COREG) 6.25 MG tablet TAKE ONE TABLET TWICE DAILY 60 tablet 1  . celecoxib (CELEBREX) 200 MG capsule Take 200 mg by mouth every other day.   4  . cloNIDine (CATAPRES) 0.1 MG tablet Take 0.1 mg by mouth 2 (two) times daily.    Marland Kitchen dicyclomine (BENTYL) 10 MG capsule Take 10 mg by mouth daily as needed for spasms.     Marland Kitchen enoxaparin (LOVENOX) 60 MG/0.6ML injection INJECT 0.75m IN TO SKIN EVERY 12 HOURS 60 Syringe 0  . fexofenadine (ALLEGRA ALLERGY) 180 MG tablet Take 180 mg by mouth daily.     . furosemide (LASIX) 40 MG tablet  Take 1-2 tablets daily as directed. 45 tablet 6  . hydrOXYzine (ATARAX/VISTARIL) 25 MG tablet Take 25 mg by mouth 3 (three) times daily as needed for anxiety.    .Marland Kitchenlevothyroxine (SYNTHROID, LEVOTHROID) 88 MCG tablet Take 1 tablet (88 mcg total) by mouth daily before breakfast. 90 tablet 3  . OXYGEN Inhale 2 L into the lungs continuous.    . potassium chloride SA (K-DUR,KLOR-CON) 20 MEQ tablet Take 10 meq (1/2 tab) as directed. 15 tablet 6  . Probiotic Product (PROBIOTIC ADVANCED PO) Take 1 tablet by mouth daily. Reported on 10/17/2015    . simvastatin (ZOCOR) 20 MG tablet Take 10 mg by mouth daily. TAKE 1/2 TAB    . valsartan (DIOVAN) 320 MG tablet Take 1 tablet (320 mg total) by mouth daily. 30 tablet 5  . zolpidem (AMBIEN) 5 MG tablet Take 5 mg by mouth at bedtime as needed for sleep.    .Marland Kitchenacetaminophen (TYLENOL) 500 MG tablet Take 1,000 mg by mouth every 6 (six) hours as needed for mild pain, moderate pain or headache. Reported on 09/26/2015    . NITROSTAT 0.4 MG SL tablet Place 0.4 mg under the tongue every 5 (five) minutes as needed for chest pain (x 3 doses). Reported on 10/17/2015  1   No current facility-administered medications for this visit.     Review of Systems  Constitutional: positive for fatigue Eyes: negative Ears, nose, mouth, throat, and face: negative Respiratory: positive for cough, dyspnea on exertion and pleurisy/chest pain Cardiovascular: negative Gastrointestinal: negative Genitourinary:negative Integument/breast: negative Hematologic/lymphatic: negative Musculoskeletal:negative Neurological: negative Behavioral/Psych: negative Endocrine: negative Allergic/Immunologic: negative  Physical Exam  RUDJ:SHFWY healthy, no distress, well nourished and well developed SKIN: skin color, texture, turgor are normal, no rashes or significant lesions HEAD: Normocephalic, No masses, lesions, tenderness or abnormalities EYES: normal, PERRLA, Conjunctiva are pink and  non-injected EARS: External ears normal, Canals clear OROPHARYNX:no exudate, no erythema and lips, buccal mucosa, and tongue normal  NECK: supple, no adenopathy, no JVD LYMPH:  no palpable lymphadenopathy, no hepatosplenomegaly BREAST:not examined LUNGS: decreased breath sounds HEART: regular rate & rhythm, no murmurs and no gallops ABDOMEN:abdomen soft, non-tender, normal bowel sounds and no masses or organomegaly BACK: Back symmetric, no curvature., No CVA tenderness EXTREMITIES:no  joint deformities, effusion, or inflammation, no edema, no skin discoloration  NEURO: alert & oriented x 3 with fluent speech, no focal motor/sensory deficits  PERFORMANCE STATUS: ECOG 1  LABORATORY DATA: Lab Results  Component Value Date   WBC 3.9 (L) 02/05/2016   HGB 9.3 (L) 02/05/2016   HCT 29.7 (L) 02/05/2016   MCV 102.1 (H) 02/05/2016   PLT 182 02/05/2016      Chemistry      Component Value Date/Time   NA 132 (L) 02/05/2016 1535   NA 136 01/15/2016 1108   K 4.5 02/05/2016 1535   K 4.5 01/15/2016 1108   CL 96 (L) 02/05/2016 1535   CO2 28 02/05/2016 1535   CO2 25 01/15/2016 1108   BUN 41 (H) 02/05/2016 1535   BUN 76.2 (H) 01/15/2016 1108   CREATININE 1.16 (H) 02/05/2016 1535   CREATININE 1.3 (H) 01/15/2016 1108      Component Value Date/Time   CALCIUM 9.7 02/05/2016 1535   CALCIUM 10.9 (H) 01/15/2016 1108   ALKPHOS 40 02/05/2016 1535   ALKPHOS 37 (L) 01/15/2016 1108   AST 24 02/05/2016 1535   AST 15 01/15/2016 1108   ALT 22 02/05/2016 1535   ALT 19 01/15/2016 1108   BILITOT 0.4 02/05/2016 1535   BILITOT 0.30 01/15/2016 1108       RADIOGRAPHIC STUDIES: Dg Chest 2 View  Result Date: 01/10/2016 CLINICAL DATA:  Worsening shortness of breath since receiving 2 units of blood yesterday. EXAM: CHEST  2 VIEW COMPARISON:  PA and lateral chest 12/24/2014, 05/22/2012 12/27/2015 and 07/13/2015. FINDINGS: The patient is status post right axillary dissection. Port-A-Cath is in place. There  is cardiomegaly. Aortic atherosclerosis is identified. There is some coarsening of the pulmonary interstitium which does not appear notably changed since the 2014 examination. Mild blunting of the right costophrenic angle is also unchanged. No pneumothorax. IMPRESSION: Chronic interstitial coarsening appears unchanged since 2014. No acute disease. Electronically Signed   By: Inge Rise M.D.   On: 01/10/2016 12:24   Ct Angio Chest Pe W/cm &/or Wo Cm  Result Date: 01/10/2016 CLINICAL DATA:  Shortness of breath and chest tightness, history of breast cancer EXAM: CT ANGIOGRAPHY CHEST WITH CONTRAST TECHNIQUE: Multidetector CT imaging of the chest was performed using the standard protocol during bolus administration of intravenous contrast. Multiplanar CT image reconstructions and MIPs were obtained to evaluate the vascular anatomy. CONTRAST:  100 mL Isovue 370 intravenous COMPARISON:  Chest x-ray 12/31/2015 FINDINGS: Cardiovascular: No a definitive filling defect within the main pulmonary arteries. No definite filling defect within the central segmental pulmonary arteries on the left side. There is moderate to severe narrowing of the distal right main pulmonary artery by a mass. Severe narrowing of segmental and subsegmental pulmonary arterial branches in the right upper lobe by a soft tissue mass. Poor filling of the terminal branches of right upper lobe pulmonary artery, suspected to be secondary to decreased flow; no discrete focal filling defects are visualized. Pulmonary arterial trunk is enlarged. There is aneurysmal dilatation of the ascending thoracic aorta, measuring up to 4.7 cm in diameter. Ectasia of the aortic arch, arch diameter is 3.2 cm. Proximal descending thoracic aortic diameter 3.2 cm. No intimal flap is seen. No mediastinal hematoma. Extensive calcifications are present within the aorta and great vessels. Aortic arch is left-sided. There is aberrant course of the right subclavian artery  from the distal arch, it demonstrates retroesophageal course. There is a central venous catheter present within a persistent left-sided SVC. There  is moderate cardiomegaly. No large pericardial effusion. Mediastinum/Nodes: Multiple enlarged, possibly necrotic lymph nodes within the mediastinum. A right pretracheal lymph node measures 2.7 by 2 cm. Inferior to this is an additional enlarged lymph node measuring 2.7 by 2.5 cm. Oval hypodense lesion to the left of the pulmonary artery trunk may reflect additional enlarged lymph node, this measures 2.6 by 2 cm. Lungs/Pleura: There are bilateral pleural effusions, small on the left and small to moderate on the right. Emphysematous disease is present bilaterally, most notable in the upper lobes. There is a large right hilar soft tissue mass, this measures 4.2 cm AP by 3.6 cm transverse by 5.2 cm cranial caudad. There is suspected invasion of the mediastinum by the mass or possible contiguity of the mass with mediastinal adenopathy. The mass encases the right-sided pulmonary artery and segmental branches of the right upper lobe. There is plaque-like soft tissue density in the apical portion of the right upper lobe with pleural thickening suspected to represent scarring. There are similar findings in the apical portion of the left upper lobe. Hazy attenuation present within the anterior portion of right upper lobe, possible pneumonitis or post radiation change. Adjacent subpleural fibrosis. Stellate soft tissue focus subpleural right middle lobe, series 11, image number 71, possible scar. Multiple tiny nodular densities are present within the anterior aspect of the left upper lobe. Small 5 mm possible developing nodule in the left lower lobe. Upper Abdomen: Borderline enlarged spleen.  No acute abnormalities. Musculoskeletal: No acute osseous abnormality. No highly suspicious bone lesions. Surgical clips in the right axilla. Review of the MIP images confirms the above  findings. IMPRESSION: 1. No definite CT evidence for acute pulmonary embolus, however right upper lobe pulmonary arterial evaluation is slightly limited by the presence of an encasing lung mass. Enlarged pulmonary arterial trunk, correlate clinically for pulmonary hypertension. 2. Large right hilar soft tissue mass with suspected invasion of the mediastinum. The mass encases and narrows the distal right main pulmonary artery and branch vessels of the right upper lobe. Multiple suspicious enlarged mediastinal lymph nodes are also present. Findings could be secondary to metastatic disease or primary lung carcinoma. 3. Small right greater than left bilateral pleural effusions. Bilateral emphysematous disease. Additional scattered nodular densities in the anterior left upper lobe could relate to infectious or inflammatory process with metastatic disease not excluded. 4. Marked cardiomegaly. Dense atherosclerotic vascular disease of the aorta. 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 5. Aneurysmal dilatation of the ascending aorta. Ascending thoracic aortic aneurysm. Recommend semi-annual imaging followup by CTA or MRA and referral to cardiothoracic surgery if not already obtained. This recommendation follows 2010 ACCF/AHA/AATS/ACR/ASA/SCA/SCAI/SIR/STS/SVM Guidelines for the Diagnosis and Management of Patients With Thoracic Aortic Disease. Circulation. 2010; 121: P536-R443 Electronically Signed   By: Donavan Foil M.D.   On: 01/10/2016 15:58   Nm Pet Image Initial (pi) Skull Base To Thigh  Result Date: 01/25/2016 CLINICAL DATA:  Initial treatment strategy for right upper lobe lung mass detected on recent chest CT angiogram performed in the setting of chest tightness and shortness of breath. History of breast cancer. EXAM: NUCLEAR MEDICINE PET SKULL BASE TO THIGH TECHNIQUE:  5.8 mCi F-18 FDG was injected intravenously. Full-ring PET imaging was performed from the skull base to thigh after the radiotracer. CT data was obtained and used for attenuation correction and anatomic localization. FASTING BLOOD GLUCOSE:  Value: 109 mg/dl COMPARISON:  01/10/2016 chest CT angiogram. 08/05/2015 CT abdomen/pelvis. FINDINGS: NECK No hypermetabolic lymph nodes in the neck. CHEST Hypermetabolic central right upper lobe 5.1 x 3.7 cm lung mass with max SUV 15.4 (series 8/image 29), which encases and occludes the associated segmental right upper lobe bronchus, and which is confluent with hypermetabolic right hilar and right lower paratracheal adenopathy. Patchy ground-glass opacity in the anterior right upper lobe is most consistent with postobstructive pneumonitis. Separate sharply marginated focus of subpleural reticulation in the anterior right upper lobe is consistent with mild radiation fibrosis. Mild patchy tree-in-bud opacities in the anterior left upper lobe with associated low level metabolism (max SUV 2.7), stable since 01/10/2016, favor mild infectious or inflammatory bronchiolitis. No additional significant pulmonary nodules. Small layering right pleural effusion. No left pleural effusion. No pleural nodularity or hypermetabolism. Hypermetabolic right paratracheal adenopathy, for example a 2.7 cm right lower paratracheal node with max SUV 13.6 (series 4/image 50). No contralateral mediastinal or contralateral hilar hypermetabolic adenopathy. Surgical clips are again noted in the right axilla. No hypermetabolic axillary lymph nodes. Duplicated superior vena cava. Left internal jugular MediPort terminates within the lower third of the left superior vena cava. Moderate cardiomegaly. Left main, left anterior descending, left circumflex and right coronary atherosclerosis. Stable atherosclerotic aneurysmal ascending thoracic aorta, maximum diameter 4.8 cm. Aberrant nonaneurysmal right subclavian  artery arising from the distal aortic arch with retroesophageal course. Stable prominently dilated main pulmonary artery (4.7 cm diameter). ABDOMEN/PELVIS Left adrenal 1.7 cm hypermetabolic nodule with max SUV 12.0 (series 4/image 95), new since 08/05/2015. No right adrenal hypermetabolism or nodularity. Focus of hypermetabolism in the lateral left lower ventral abdominal muscle wall with associated vague low-attenuation 1.4 cm lesion on the CT images with max SUV 7.1 (series 4/image 123). Focal hypermetabolism at the anorectal junction with max SUV 6.6, without discrete mass or definite wall thickening on the CT images. No abnormal hypermetabolic activity within the liver, pancreas, or spleen. No hypermetabolic lymph nodes in the abdomen or pelvis. Cholecystectomy. Atherosclerotic nonaneurysmal abdominal aorta. Grossly stable enlarged polycystic kidneys with scattered subcentimeter hyperdense renal cortical lesions throughout both kidneys, too small to characterize. Largest simple renal cyst measures 6.0 cm in the far lower right kidney and 7.4 cm in the lower left kidney. Hysterectomy. SKELETON No focal hypermetabolic activity to suggest skeletal metastasis. Sternotomy wires appear aligned and intact. IMPRESSION: 1. Hypermetabolic central right upper lobe 4.1 cm lung mass, which is confluent with hypermetabolic right hilar and right paratracheal adenopathy. Hypermetabolic left adrenal nodule. Favor stage IV (T2b N2 M1b) primary bronchogenic carcinoma. 2. Possible small soft tissue metastasis in the lateral left lower ventral abdominal muscle wall. 3. Nonspecific focal hypermetabolism at the anorectal junction without definite CT correlate, favor physiologic uptake, although a neoplasm cannot be excluded and clinical correlation is necessary. 4. Mild patchy tree-in-bud opacity in the anterior left upper lobe with associated low level metabolism, favor mild infectious or inflammatory bronchiolitis. 5.  Postobstructive pneumonitis in the right upper lobe. 6. Cardiomegaly. Small layering right pleural effusion. No appreciable pleural nodularity or hypermetabolism . 7. Duplicated SVC. Aberrant right subclavian artery. Aortic atherosclerosis. Stable ascending thoracic aortic aneurysm, maximum diameter 4.8 cm. Stable prominently dilated main pulmonary artery suggesting chronic pulmonary arterial hypertension. 8. Polycystic kidneys. Electronically Signed   By: Ilona Sorrel M.D.   On: 01/25/2016 16:00   Dg Chest Port 1 View  Result Date: 01/11/2016 CLINICAL DATA:  82 year old female with history of right-sided hilar mass and right pleural effusion status post right-sided thoracentesis. EXAM: PORTABLE  CHEST 1 VIEW COMPARISON:  Chest x-ray 01/10/2016. FINDINGS: Small right pleural effusion. No appreciable pneumothorax. No acute consolidative airspace disease. No evidence of pulmonary edema. Mild cardiomegaly. The patient is rotated to the right on today's exam, resulting in distortion of the mediastinal contours and reduced diagnostic sensitivity and specificity for mediastinal pathology. Fullness in the right hilar region compatible with known right hilar mass. Atherosclerosis in the thoracic aorta. Left-sided internal jugular single-lumen porta cath with tip terminating in the patient's persistent left superior vena cava. Surgical clips in the right axilla, presumably from prior axillary nodal dissection. IMPRESSION: 1. Small residual right-sided pleural effusion following thoracentesis. No appreciable pneumothorax. 2. Otherwise, the radiographic appearance the chest is essentially unchanged, as above. Electronically Signed   By: Vinnie Langton M.D.   On: 01/11/2016 11:56    ASSESSMENT: This is a very pleasant 82 years old white female with history of right breast carcinoma in 1994 status post lumpectomy followed by radiotherapy as well as adjuvant systemic chemotherapy and hormonal treatment. She is  presenting recently with large right hilar mass in addition to mediastinal lymphadenopathy as well as suspicious left adrenal metastasis questionable for stage IV primary bronchogenic carcinoma but recurrence of breast cancer could not be ruled out at this point.   PLAN: I had a lengthy discussion with the patient and her family about her current condition and further investigation to confirm her diagnosis. I reviewed the images of the PET scan with the patient and her family. I strongly recommended for her to proceed with bronchoscopy plus/minus endoscopic ultrasound and biopsies of the lymph node under the care of Dr. Lamonte Sakai as a scheduled tomorrow. I also recommended for the patient to complete the staging workup by ordering a CT scan of the head with and without contrast to rule out brain metastasis. The patient mentions that she cannot do MRI of the brain because of the metallic heart valve. I also had a lengthy discussion with the patient and her family about possible treatment options in the future including targeted therapy if she has an actionable mutation versus immune therapy versus palliative systemic chemotherapy if needed. If the final pathology is consistent with adenocarcinoma of the lung, I will request the pathologist to send her tissue block for molecular studies and PDL 1 testing by [facility]. I will arrange for the patient to come back for follow-up visit after the biopsy and staging workup for more detailed discussion of her treatment options. She was advised to call immediately if she has any concerning symptoms in the interval.  The patient voices understanding of current disease status and treatment options and is in agreement with the current care plan.  All questions were answered. The patient knows to call the clinic with any problems, questions or concerns. We can certainly see the patient much sooner if necessary.  Thank you so much for allowing me to participate in  the care of DERICKA OSTENSON. I will continue to follow up the patient with you and assist in her care.  I spent 40 minutes counseling the patient face to face. The total time spent in the appointment was 60 minutes.  Disclaimer: This note was dictated with voice recognition software. Similar sounding words can inadvertently be transcribed and may not be corrected upon review.   Binyomin Brann K. February 06, 2016, 3:26 PM

## 2016-02-06 NOTE — Telephone Encounter (Signed)
Pt returning call.Deborah [name] ° °

## 2016-02-07 ENCOUNTER — Encounter (HOSPITAL_COMMUNITY)
Admission: RE | Disposition: A | Discharge status: Home / Self Care | Payer: Self-pay | Source: Ambulatory Visit | Attending: Emergency Medicine

## 2016-02-07 ENCOUNTER — Ambulatory Visit (HOSPITAL_COMMUNITY): Payer: Medicare Other | Admitting: Anesthesiology

## 2016-02-07 ENCOUNTER — Ambulatory Visit (HOSPITAL_COMMUNITY)
Admission: RE | Admit: 2016-02-07 | Discharge: 2016-02-07 | Disposition: A | Discharge status: Home / Self Care | Payer: Medicare Other | Source: Ambulatory Visit | Attending: Emergency Medicine | Admitting: Emergency Medicine

## 2016-02-07 ENCOUNTER — Other Ambulatory Visit: Payer: Self-pay | Admitting: *Deleted

## 2016-02-07 ENCOUNTER — Encounter (HOSPITAL_COMMUNITY): Payer: Self-pay | Admitting: *Deleted

## 2016-02-07 DIAGNOSIS — Z79899 Other long term (current) drug therapy: Secondary | ICD-10-CM | POA: Insufficient documentation

## 2016-02-07 DIAGNOSIS — R590 Localized enlarged lymph nodes: Secondary | ICD-10-CM | POA: Diagnosis not present

## 2016-02-07 DIAGNOSIS — Z923 Personal history of irradiation: Secondary | ICD-10-CM | POA: Diagnosis not present

## 2016-02-07 DIAGNOSIS — Z803 Family history of malignant neoplasm of breast: Secondary | ICD-10-CM | POA: Insufficient documentation

## 2016-02-07 DIAGNOSIS — K219 Gastro-esophageal reflux disease without esophagitis: Secondary | ICD-10-CM | POA: Insufficient documentation

## 2016-02-07 DIAGNOSIS — Z888 Allergy status to other drugs, medicaments and biological substances status: Secondary | ICD-10-CM | POA: Insufficient documentation

## 2016-02-07 DIAGNOSIS — Z8249 Family history of ischemic heart disease and other diseases of the circulatory system: Secondary | ICD-10-CM | POA: Insufficient documentation

## 2016-02-07 DIAGNOSIS — I509 Heart failure, unspecified: Secondary | ICD-10-CM | POA: Insufficient documentation

## 2016-02-07 DIAGNOSIS — Q2733 Arteriovenous malformation of digestive system vessel: Secondary | ICD-10-CM | POA: Insufficient documentation

## 2016-02-07 DIAGNOSIS — Z9221 Personal history of antineoplastic chemotherapy: Secondary | ICD-10-CM | POA: Insufficient documentation

## 2016-02-07 DIAGNOSIS — R918 Other nonspecific abnormal finding of lung field: Secondary | ICD-10-CM | POA: Diagnosis not present

## 2016-02-07 DIAGNOSIS — Z9842 Cataract extraction status, left eye: Secondary | ICD-10-CM | POA: Insufficient documentation

## 2016-02-07 DIAGNOSIS — D649 Anemia, unspecified: Secondary | ICD-10-CM | POA: Insufficient documentation

## 2016-02-07 DIAGNOSIS — Z87891 Personal history of nicotine dependence: Secondary | ICD-10-CM | POA: Insufficient documentation

## 2016-02-07 DIAGNOSIS — C3401 Malignant neoplasm of right main bronchus: Secondary | ICD-10-CM | POA: Diagnosis not present

## 2016-02-07 DIAGNOSIS — Z801 Family history of malignant neoplasm of trachea, bronchus and lung: Secondary | ICD-10-CM | POA: Insufficient documentation

## 2016-02-07 DIAGNOSIS — M1990 Unspecified osteoarthritis, unspecified site: Secondary | ICD-10-CM | POA: Insufficient documentation

## 2016-02-07 DIAGNOSIS — Z952 Presence of prosthetic heart valve: Secondary | ICD-10-CM | POA: Insufficient documentation

## 2016-02-07 DIAGNOSIS — Z820 Family history of epilepsy and other diseases of the nervous system: Secondary | ICD-10-CM | POA: Insufficient documentation

## 2016-02-07 DIAGNOSIS — Z90710 Acquired absence of both cervix and uterus: Secondary | ICD-10-CM | POA: Insufficient documentation

## 2016-02-07 DIAGNOSIS — Z8673 Personal history of transient ischemic attack (TIA), and cerebral infarction without residual deficits: Secondary | ICD-10-CM | POA: Insufficient documentation

## 2016-02-07 DIAGNOSIS — E039 Hypothyroidism, unspecified: Secondary | ICD-10-CM | POA: Insufficient documentation

## 2016-02-07 DIAGNOSIS — I739 Peripheral vascular disease, unspecified: Secondary | ICD-10-CM | POA: Insufficient documentation

## 2016-02-07 DIAGNOSIS — M109 Gout, unspecified: Secondary | ICD-10-CM | POA: Insufficient documentation

## 2016-02-07 DIAGNOSIS — I110 Hypertensive heart disease with heart failure: Secondary | ICD-10-CM | POA: Insufficient documentation

## 2016-02-07 DIAGNOSIS — E785 Hyperlipidemia, unspecified: Secondary | ICD-10-CM | POA: Insufficient documentation

## 2016-02-07 DIAGNOSIS — R011 Cardiac murmur, unspecified: Secondary | ICD-10-CM | POA: Insufficient documentation

## 2016-02-07 DIAGNOSIS — Z853 Personal history of malignant neoplasm of breast: Secondary | ICD-10-CM | POA: Insufficient documentation

## 2016-02-07 DIAGNOSIS — E7800 Pure hypercholesterolemia, unspecified: Secondary | ICD-10-CM | POA: Insufficient documentation

## 2016-02-07 DIAGNOSIS — Z882 Allergy status to sulfonamides status: Secondary | ICD-10-CM | POA: Insufficient documentation

## 2016-02-07 DIAGNOSIS — Z9049 Acquired absence of other specified parts of digestive tract: Secondary | ICD-10-CM | POA: Insufficient documentation

## 2016-02-07 DIAGNOSIS — I4891 Unspecified atrial fibrillation: Secondary | ICD-10-CM | POA: Insufficient documentation

## 2016-02-07 DIAGNOSIS — I482 Chronic atrial fibrillation: Secondary | ICD-10-CM | POA: Insufficient documentation

## 2016-02-07 DIAGNOSIS — Z9981 Dependence on supplemental oxygen: Secondary | ICD-10-CM | POA: Insufficient documentation

## 2016-02-07 DIAGNOSIS — Z9841 Cataract extraction status, right eye: Secondary | ICD-10-CM | POA: Insufficient documentation

## 2016-02-07 DIAGNOSIS — Z800 Family history of malignant neoplasm of digestive organs: Secondary | ICD-10-CM | POA: Insufficient documentation

## 2016-02-07 DIAGNOSIS — Z7901 Long term (current) use of anticoagulants: Secondary | ICD-10-CM | POA: Insufficient documentation

## 2016-02-07 DIAGNOSIS — K922 Gastrointestinal hemorrhage, unspecified: Secondary | ICD-10-CM | POA: Insufficient documentation

## 2016-02-07 HISTORY — PX: VIDEO BRONCHOSCOPY WITH ENDOBRONCHIAL ULTRASOUND: SHX6177

## 2016-02-07 LAB — CBC
HCT: 29.6 % — ABNORMAL LOW (ref 36.0–46.0)
HEMOGLOBIN: 9.5 g/dL — AB (ref 12.0–15.0)
MCH: 33.2 pg (ref 26.0–34.0)
MCHC: 32.1 g/dL (ref 30.0–36.0)
MCV: 103.5 fL — AB (ref 78.0–100.0)
PLATELETS: 166 10*3/uL (ref 150–400)
RBC: 2.86 MIL/uL — AB (ref 3.87–5.11)
RDW: 19.3 % — ABNORMAL HIGH (ref 11.5–15.5)
WBC: 3.4 10*3/uL — AB (ref 4.0–10.5)

## 2016-02-07 LAB — APTT: APTT: 50 s — AB (ref 24–36)

## 2016-02-07 LAB — PROTIME-INR
INR: 0.96
PROTHROMBIN TIME: 12.7 s (ref 11.4–15.2)

## 2016-02-07 LAB — TYPE AND SCREEN
ABO/RH(D): O POS
Antibody Screen: NEGATIVE

## 2016-02-07 SURGERY — BRONCHOSCOPY, WITH EBUS
Anesthesia: General | Site: Bronchus

## 2016-02-07 MED ORDER — LIDOCAINE 2% (20 MG/ML) 5 ML SYRINGE
INTRAMUSCULAR | Status: AC
  Filled 2016-02-07: qty 5

## 2016-02-07 MED ORDER — FENTANYL CITRATE (PF) 100 MCG/2ML IJ SOLN
INTRAMUSCULAR | Status: AC
  Filled 2016-02-07: qty 2

## 2016-02-07 MED ORDER — SUGAMMADEX SODIUM 200 MG/2ML IV SOLN
INTRAVENOUS | Status: DC | PRN
  Administered 2016-02-07: 200 mg via INTRAVENOUS

## 2016-02-07 MED ORDER — 0.9 % SODIUM CHLORIDE (POUR BTL) OPTIME
TOPICAL | Status: DC | PRN
  Administered 2016-02-07: 1000 mL

## 2016-02-07 MED ORDER — PHENYLEPHRINE HCL 10 MG/ML IJ SOLN
INTRAVENOUS | Status: DC | PRN
  Administered 2016-02-07: 50 ug/min via INTRAVENOUS

## 2016-02-07 MED ORDER — PROPOFOL 10 MG/ML IV BOLUS
INTRAVENOUS | Status: DC | PRN
  Administered 2016-02-07: 50 mg via INTRAVENOUS

## 2016-02-07 MED ORDER — FENTANYL CITRATE (PF) 100 MCG/2ML IJ SOLN: 25.0000 ug | INTRAMUSCULAR | Status: DC | PRN

## 2016-02-07 MED ORDER — SUGAMMADEX SODIUM 200 MG/2ML IV SOLN
INTRAVENOUS | Status: AC
  Filled 2016-02-07: qty 2

## 2016-02-07 MED ORDER — LIDOCAINE HCL (CARDIAC) 20 MG/ML IV SOLN
INTRAVENOUS | Status: DC | PRN
  Administered 2016-02-07: 60 mg via INTRAVENOUS
  Administered 2016-02-07: 40 mg via INTRAVENOUS

## 2016-02-07 MED ORDER — POTASSIUM CHLORIDE CRYS ER 20 MEQ PO TBCR: 10.0000 meq | EXTENDED_RELEASE_TABLET | Freq: Two times a day (BID) | ORAL | Status: DC | PRN

## 2016-02-07 MED ORDER — ONDANSETRON HCL 4 MG/2ML IJ SOLN
INTRAMUSCULAR | Status: AC
  Filled 2016-02-07: qty 2

## 2016-02-07 MED ORDER — ROCURONIUM BROMIDE 100 MG/10ML IV SOLN
INTRAVENOUS | Status: DC | PRN
  Administered 2016-02-07 (×2): 10 mg via INTRAVENOUS
  Administered 2016-02-07: 30 mg via INTRAVENOUS

## 2016-02-07 MED ORDER — PHENYLEPHRINE HCL 10 MG/ML IJ SOLN
INTRAMUSCULAR | Status: DC | PRN
  Administered 2016-02-07 (×2): 80 ug via INTRAVENOUS

## 2016-02-07 MED ORDER — ONDANSETRON HCL 4 MG/2ML IJ SOLN
INTRAMUSCULAR | Status: DC | PRN
  Administered 2016-02-07: 4 mg via INTRAVENOUS

## 2016-02-07 MED ORDER — FENTANYL CITRATE (PF) 100 MCG/2ML IJ SOLN
INTRAMUSCULAR | Status: DC | PRN
  Administered 2016-02-07: 50 ug via INTRAVENOUS

## 2016-02-07 MED ORDER — LIDOCAINE HCL (PF) 1 % IJ SOLN
INTRAMUSCULAR | Status: AC
  Filled 2016-02-07: qty 30

## 2016-02-07 MED ORDER — PROPOFOL 10 MG/ML IV BOLUS
INTRAVENOUS | Status: AC
  Filled 2016-02-07: qty 20

## 2016-02-07 MED ORDER — LACTATED RINGERS IV SOLN
INTRAVENOUS | Status: DC
  Administered 2016-02-07: 09:00:00 via INTRAVENOUS

## 2016-02-07 MED ORDER — CARVEDILOL 6.25 MG PO TABS: ORAL_TABLET | ORAL | 1 refills | Status: DC

## 2016-02-07 MED ORDER — ROCURONIUM BROMIDE 10 MG/ML (PF) SYRINGE
PREFILLED_SYRINGE | INTRAVENOUS | Status: AC
  Filled 2016-02-07: qty 10

## 2016-02-07 SURGICAL SUPPLY — 29 items
BRUSH CYTOL CELLEBRITY 1.5X140 (MISCELLANEOUS) ×2 IMPLANT
CANISTER SUCTION 2500CC (MISCELLANEOUS) ×3 IMPLANT
CONT SPEC 4OZ CLIKSEAL STRL BL (MISCELLANEOUS) ×3 IMPLANT
COVER DOME SNAP 22 D (MISCELLANEOUS) ×3 IMPLANT
COVER TABLE BACK 60X90 (DRAPES) ×3 IMPLANT
FORCEPS BIOP RJ4 1.8 (CUTTING FORCEPS) ×2 IMPLANT
GAUZE SPONGE 4X4 12PLY STRL (GAUZE/BANDAGES/DRESSINGS) IMPLANT
GLOVE BIO SURGEON STRL SZ7 (GLOVE) ×2 IMPLANT
GLOVE BIO SURGEON STRL SZ7.5 (GLOVE) ×3 IMPLANT
GOWN STRL REUS W/ TWL LRG LVL3 (GOWN DISPOSABLE) ×1 IMPLANT
GOWN STRL REUS W/TWL LRG LVL3 (GOWN DISPOSABLE) ×6
KIT CLEAN ENDO COMPLIANCE (KITS) ×6 IMPLANT
KIT ROOM TURNOVER OR (KITS) ×3 IMPLANT
MARKER SKIN DUAL TIP RULER LAB (MISCELLANEOUS) ×3 IMPLANT
NDL BIOPSY TRANSBRONCH 21G (NEEDLE) IMPLANT
NDL EBUS SONO TIP PENTAX (NEEDLE) ×1 IMPLANT
NEEDLE BIOPSY TRANSBRONCH 21G (NEEDLE) IMPLANT
NEEDLE EBUS SONO TIP PENTAX (NEEDLE) ×6 IMPLANT
NS IRRIG 1000ML POUR BTL (IV SOLUTION) ×3 IMPLANT
OIL SILICONE PENTAX (PARTS (SERVICE/REPAIRS)) ×2 IMPLANT
PAD ARMBOARD 7.5X6 YLW CONV (MISCELLANEOUS) ×6 IMPLANT
SYR 20CC LL (SYRINGE) ×1 IMPLANT
SYR 20ML ECCENTRIC (SYRINGE) ×6 IMPLANT
SYR 5ML LUER SLIP (SYRINGE) ×3 IMPLANT
TOWEL OR 17X24 6PK STRL BLUE (TOWEL DISPOSABLE) ×3 IMPLANT
TRAP SPECIMEN MUCOUS 40CC (MISCELLANEOUS) IMPLANT
TUBE CONNECTING 20'X1/4 (TUBING) ×1
TUBE CONNECTING 20X1/4 (TUBING) ×3 IMPLANT
WATER STERILE IRR 1000ML POUR (IV SOLUTION) ×3 IMPLANT

## 2016-02-07 NOTE — Anesthesia Postprocedure Evaluation (Signed)
Anesthesia Post Note  Patient: Deborah [name]  Procedure(s) Performed: Procedure(s) (LRB): VIDEO BRONCHOSCOPY WITH ENDOBRONCHIAL ULTRASOUND (N/A)  Patient location during evaluation: PACU Anesthesia Type: General Level of consciousness: awake and alert Pain management: pain level controlled Vital Signs Assessment: post-procedure vital signs reviewed and stable Respiratory status: spontaneous breathing, nonlabored ventilation, respiratory function stable and patient connected to nasal cannula oxygen Cardiovascular status: blood pressure returned to baseline and stable Postop Assessment: no signs of nausea or vomiting Anesthetic complications: no    Last Vitals:  Vitals:   02/07/16 1415 02/07/16 1430  BP: 131/64   Pulse: 83 92  Resp: (!) 25 (!) 29  Temp:      Last Pain:  Vitals:   02/07/16 1300  TempSrc:   PainSc: 0-No pain                 Yarely Bebee,W. EDMOND

## 2016-02-07 NOTE — Transfer of Care (Signed)
Immediate Anesthesia Transfer of Care Note  Patient: Deborah [name]  Procedure(s) Performed: Procedure(s): VIDEO BRONCHOSCOPY WITH ENDOBRONCHIAL ULTRASOUND (N/A)  Patient Location: PACU  Anesthesia Type:General  Level of Consciousness: awake, alert  and oriented  Airway & Oxygen Therapy: Patient Spontanous Breathing and Patient connected to nasal cannula oxygen  Post-op Assessment: Report given to RN, Post -op Vital signs reviewed and stable and Patient moving all extremities  Post vital signs: Reviewed and stable  Last Vitals:  Vitals:   02/07/16 0859 02/07/16 1235  BP: (!) 113/59 (!) 86/65  Pulse: 78 88  Resp: 16 17  Temp: 36.6 C 36.8 C    Last Pain:  Vitals:   02/07/16 1235  TempSrc:   PainSc: 0-No pain         Complications: No apparent anesthesia complications

## 2016-02-07 NOTE — Discharge Instructions (Signed)
Flexible Bronchoscopy, Care After These instructions give you information on caring for yourself after your procedure. Your doctor may also give you more specific instructions. Call your doctor if you have any problems or questions after your procedure. HOME CARE  Do not eat or drink anything for 2 hours after your procedure. If you try to eat or drink before the medicine wears off, food or drink could go into your lungs. You could also burn yourself.  After 2 hours have passed and when you can cough and gag normally, you may eat soft food and drink liquids slowly.  The day after the test, you may eat your normal diet.  You may do your normal activities.  Keep all doctor visits. GET HELP RIGHT AWAY IF:  You get more and more short of breath.  You get light-headed.  You feel like you are going to pass out (faint).  You have chest pain.  You have new problems that worry you.  You cough up more than a little blood.  You cough up more blood than before. MAKE SURE YOU:  Understand these instructions.  Will watch your condition.  Will get help right away if you are not doing well or get worse.   This information is not intended to replace advice given to you by your health care provider. Make sure you discuss any questions you have with your health care provider.  NOTE: YOU MAY RESTART ENOXAPARIN ON 02/08/16 IN THE EVENING.   PLEASE CALL OUR OFFICE FOR ANY QUESTIONS OR CONCERNS.  [phone]   Document Released: 01/13/2009 Document Revised: 03/23/2013 Document Reviewed: 11/20/2012 Elsevier Interactive Patient Education [employer].

## 2016-02-07 NOTE — Anesthesia Preprocedure Evaluation (Addendum)
Anesthesia Evaluation  Patient identified by MRN, date of birth, ID band Patient awake    Reviewed: Allergy & Precautions, H&P , NPO status , Patient's Chart, lab work & pertinent test results, reviewed documented beta blocker date and time   Airway Mallampati: II  TM Distance: >3 FB Neck ROM: Full    Dental no notable dental hx. (+) Teeth Intact, Dental Advisory Given   Pulmonary shortness of breath and Long-Term Oxygen Therapy, former smoker,  Lung mass that encases the right pulmonary artery.   Pulmonary exam normal breath sounds clear to auscultation       Cardiovascular hypertension, Pt. on medications and Pt. on home beta blockers + Peripheral Vascular Disease and +CHF  + dysrhythmias Atrial Fibrillation + Valvular Problems/Murmurs  Rhythm:Regular Rate:Normal     Neuro/Psych TIAnegative psych ROS   GI/Hepatic Neg liver ROS, GERD  Controlled,  Endo/Other  Hypothyroidism   Renal/GU negative Renal ROS  negative genitourinary   Musculoskeletal  (+) Arthritis , Osteoarthritis,    Abdominal   Peds  Hematology negative hematology ROS (+) anemia ,   Anesthesia Other Findings   Reproductive/Obstetrics negative OB ROS                           Anesthesia Physical Anesthesia Plan  ASA: IV  Anesthesia Plan: General   Post-op Pain Management:    Induction: Intravenous  Airway Management Planned: Oral ETT  Additional Equipment:   Intra-op Plan:   Post-operative Plan: Extubation in OR  Informed Consent: I have reviewed the patients History and Physical, chart, labs and discussed the procedure including the risks, benefits and alternatives for the proposed anesthesia with the patient or authorized representative who has indicated his/her understanding and acceptance.   Dental advisory given  Plan Discussed with: CRNA  Anesthesia Plan Comments:         Anesthesia Quick  Evaluation

## 2016-02-07 NOTE — Op Note (Signed)
Video Bronchoscopy with Endobronchial Ultrasound Procedure Note  Date of Operation: 02/07/2016  Pre-op Diagnosis: R hilar mass  Post-op Diagnosis: same  Surgeon: Baltazar Apo  Assistants: none  Anesthesia: General endotracheal anesthesia  Operation: Flexible video fiberoptic bronchoscopy with endobronchial ultrasound and biopsies.  Estimated Blood Loss: 43PI  Complications: none apparent  Indications and History: Deborah [name] is a 82 y.o. female with hx tobacco, remote breast CA. She has been found to have a R hilar mass encasing the R PA. Recommendation was made to pursue tissue diagnosis via bronchoscopy with endobronchial ultrasound and biopsies.  The risks, benefits, complications, treatment options and expected outcomes were discussed with the patient.  The possibilities of pneumothorax, pneumonia, reaction to medication, pulmonary aspiration, perforation of a viscus, bleeding, failure to diagnose a condition and creating a complication requiring transfusion or operation were discussed with the patient who freely signed the consent.    Description of Procedure: The patient was examined in the preoperative area and history and data from the preprocedure consultation were reviewed. It was deemed appropriate to proceed.  The patient was taken to OR10, identified as Deborah [name] and the procedure verified as Flexible Video Fiberoptic Bronchoscopy.  A Time Out was held and the above information confirmed. After being taken to the operating room general anesthesia was initiated and the patient was orally intubated. The video fiberoptic bronchoscope was introduced via the endotracheal tube and a general inspection was performed which showed hyperemia of the right sided mucosa. The apical segment of the RUL was narrowed with a suspected endobronchial lesion obstructing the apical segment. Endobronchial brushings and biopsies were performed in the RUL bronchus. The standard scope was then  withdrawn and the endobronchial ultrasound was used to identify and characterize the peritracheal, hilar and bronchial lymph nodes. Inspection showed enlarged 4R node that extended down to a R hilar mass. The R PA was seen coursing through the mass. Using real-time ultrasound guidance Wang needle biopsies were take from Station 4R node and R hilar mass taking care to avoid the PA noted on doppler of the area. These were sent for cytology. The RUL bronchus oozed after biopsies but stopped without intervention. The patient tolerated the procedure well without apparent complications. The bronchoscope was withdrawn. Anesthesia was reversed and the patient was taken to the PACU for recovery.   Samples: 1. Wang needle biopsies from 4R node 2. Wang needle biopsies R hilar mass 3. Endobronchial brushings from RUL bronchus 4. Endobronchial biopsies from the RUL bronchus  Plans:  The patient will either be discharged from the PACU to home or admitted for 23 hour observation when recovered from anesthesia, depending on her status post-procedure. We will review the cytology, pathology results with the patient when they become available. Outpatient followup will be with Dr Lamonte Sakai, Dr Magrinot, Dr Julien Nordmann.    Baltazar Apo, MD, PhD 02/07/2016, 12:27 PM [facility] [phone] or if no answer [phone]

## 2016-02-07 NOTE — H&P (Signed)
Deborah [name] is an 82 y.o. female.   Chief Complaint: R hilar mass HPI:  82 y.o.woman, Former smoker (~20 pk-yrs), with a PMH remote right sided breast CA s/p lumpectomy and chemo / radiation in 1994. She has been cancer free since. She also has a history of hypertension, hyperlipidemia, chronic atrial fibrillation and a St. Jude mechanical aortic valve replacement on anticoagulation, recently changed to enoxaparin twice a day. Has seen Dr. Jana Hakim for anemia presumed due to chronic GI blood loss in the setting of AVMs and gets PRN blood transfusions (had 2 units 01/09/16 and planned for repeat on 11/9). On 10/11, she was brought to [facility] ED with SOB. Had CTA that demonstrated right sided lung mass. There was an associated right effusion that was tapped. Cytology has not revealed any evidence of malignancy.   I have reviewed her CT scan of the chest from 01/10/16. This shows no evidence pulmonary embolism but does reveal a right hilar mass that surrounds and narrows the right main pulmonary artery, narrows and impacts the anterior segment bronchus of the right upper lobe. The bronchus is narrowed but is not clear as to whether there is an endobronchial lesion. Also noted is an enlarged 4R lymph node adjacent to the hilar mass.  Based on review of her films and the risks / benefits we have decided to pursue tissue diagnosis via bronchoscopy with EBUS and needle biopsies.  She is admitted now electively for this procedure. She has no new complaints, no new issues reported. Family is here with her. She last took her enoxaparin am dose on 11/7. No barriers to procedure noted.    Past Medical History:  Diagnosis Date  . Acute CHF (congestive heart failure) ([place]) 01/10/2016  . Anemia   . Arthritis    "fingers, toes, hips; qwhere" (05/19/2013)  . Breast cancer ([place])    "right; S/P lumpectomy, chemo, XRT"   . Chronic lower GI bleeding   . Complication of anesthesia    "once I'm awakened, I don't  sleep til the following night" BP drops very low had to stop putting in [place] -a- cath  . GERD (gastroesophageal reflux disease)   . Gout   . Heart murmur   . High cholesterol   . History of blood transfusion 1996; ~ 2013   "related to valve replacement; GI bleeding"   . History of echocardiogram    Echo 1/17: mild LVH, EF 50-55%, no RWMA, mild AI, mechanical AVR with mean 21 mmHg/peak 42 mmHg (stable since 2012), mod MR, mod to severe BAE, PASP 36 mmHg  . History of nuclear stress test    Myoview 1/17: EF 59%, normal perfusion, low risk  . Hypertension   . Hypothyroidism   . On supplemental oxygen therapy   . Permanent atrial fibrillation ([place])       . Pneumonia 07/2011  . Rectus sheath hematoma 12/19/14    held anticoagulation for 1 week, transfused  . Stomach problems    seeing Dr Watt Climes  . TIA (transient ischemic attack) 12/24/14   symptoms resolved    Past Surgical History:  Procedure Laterality Date  . ABDOMINAL HYSTERECTOMY    . AORTIC VALVE REPLACEMENT  1996   St. Jude  . APPENDECTOMY    . arteriogram  01/12   NO RAS   . BREAST BIOPSY Right   . BREAST LUMPECTOMY Right   . CARDIAC CATHETERIZATION    . CARDIOVERSION     X 2  . CATARACT EXTRACTION W/  INTRAOCULAR LENS  IMPLANT, BILATERAL Bilateral    bilateral cataract with lens implants  . CHOLECYSTECTOMY    . COLONOSCOPY WITH PROPOFOL N/A 11/24/2012   Procedure: COLONOSCOPY WITH PROPOFOL;  Surgeon: Jeryl Columbia, MD;  Location: [facility] ENDOSCOPY;  Service: Endoscopy;  Laterality: N/A;  . ESOPHAGOGASTRODUODENOSCOPY (EGD) WITH PROPOFOL N/A 07/25/2015   Procedure: ESOPHAGOGASTRODUODENOSCOPY (EGD) WITH PROPOFOL;  Surgeon: Clarene Essex, MD;  Location: [facility] ENDOSCOPY;  Service: Endoscopy;  Laterality: N/A;  . HOT HEMOSTASIS N/A 07/25/2015   Procedure: HOT HEMOSTASIS (ARGON PLASMA COAGULATION/BICAP);  Surgeon: Clarene Essex, MD;  Location: [facility] ENDOSCOPY;  Service: Endoscopy;  Laterality: N/A;    Family History  Problem Relation Age of  Onset  . Dementia Mother   . Coronary artery disease Father   . Heart attack Father   . Breast cancer Daughter   . Colon cancer Sister   . Liver cancer Sister   . Lung cancer Brother   . COPD Brother    Social History:  reports that she quit smoking about 37 years ago. Her smoking use included Cigarettes. She has a 20.00 pack-year smoking history. She has never used smokeless tobacco. She reports that she drinks about 4.8 oz of alcohol per week . She reports that she does not use drugs.  Allergies:  Allergies  Allergen Reactions  . Alendronate Sodium Other (See Comments)    GI PAIN CAUTION: RISK FOR PERFORATION  . Amiodarone Shortness Of Breath  . Chlorthalidone Shortness Of Breath    ABDOMINAL BLOATING  . Macrodantin [Nitrofurantoin] Nausea And Vomiting and Other (See Comments)    CONTRAINDICATED WITH GFR < 50  . Meclizine Swelling    SWELLING OF TONGUE  . Norvasc [Amlodipine Besylate] Swelling    SWELLING REACTION UNSPECIFIED   . Hydralazine Hcl Nausea Only  . Sulfa Antibiotics Other (See Comments)    Unknown  . Tussionex Pennkinetic Er [Hydrocod Polst-Cpm Polst Er] Other (See Comments)    UNSPECIFIED REACTION     Medications Prior to Admission  Medication Sig Dispense Refill  . allopurinol (ZYLOPRIM) 300 MG tablet Take 300 mg by mouth at bedtime.     Marland Kitchen amLODipine (NORVASC) 5 MG tablet Take 5 mg by mouth at bedtime.    . calcium-vitamin D 500 MG tablet Take 1 tablet by mouth 2 (two) times daily. Reported on 09/26/2015    . carvedilol (COREG) 6.25 MG tablet TAKE ONE TABLET TWICE DAILY (Patient taking differently: TAKE 6.25 MG BY MOUTH TWICE DAILY) 60 tablet 1  . celecoxib (CELEBREX) 200 MG capsule Take 200 mg by mouth daily as needed for moderate pain.   4  . cloNIDine (CATAPRES) 0.1 MG tablet Take 0.1 mg by mouth daily.     Marland Kitchen dicyclomine (BENTYL) 10 MG capsule Take 10 mg by mouth daily as needed for spasms.     Marland Kitchen enoxaparin (LOVENOX) 60 MG/0.6ML injection INJECT 0.5m IN  TO SKIN EVERY 12 HOURS 60 Syringe 0  . fexofenadine (ALLEGRA ALLERGY) 180 MG tablet Take 180 mg by mouth daily.     . furosemide (LASIX) 40 MG tablet Take 1-2 tablets daily as directed. (Patient taking differently: Take 40 mg by mouth 2 (two) times daily as needed for fluid. ) 45 tablet 6  . levothyroxine (SYNTHROID, LEVOTHROID) 88 MCG tablet Take 1 tablet (88 mcg total) by mouth daily before breakfast. 90 tablet 3  . OXYGEN Inhale 2 L into the lungs continuous.    . potassium chloride SA (K-DUR,KLOR-CON) 20 MEQ tablet Take 10 meq (1/2  tab) as directed. (Patient taking differently: Take 10 mEq by mouth 2 (two) times daily as needed (taking with Lasix). ) 15 tablet 6  . Probiotic Product (PROBIOTIC ADVANCED PO) Take 1 tablet by mouth daily. Reported on 10/17/2015    . simvastatin (ZOCOR) 20 MG tablet Take 10 mg by mouth daily.     . valsartan (DIOVAN) 320 MG tablet Take 1 tablet (320 mg total) by mouth daily. 30 tablet 5  . zolpidem (AMBIEN) 5 MG tablet Take 5 mg by mouth at bedtime.     Marland Kitchen acetaminophen (TYLENOL) 500 MG tablet Take 1,000 mg by mouth every 6 (six) hours as needed for mild pain, moderate pain or headache. Reported on 09/26/2015    . hydrOXYzine (ATARAX/VISTARIL) 25 MG tablet Take 25 mg by mouth 3 (three) times daily as needed for anxiety.    Marland Kitchen NITROSTAT 0.4 MG SL tablet Place 0.4 mg under the tongue every 5 (five) minutes as needed for chest pain (x 3 doses). Reported on 10/17/2015  1    Results for orders placed or performed during the hospital encounter of 02/07/16 (from the past 48 hour(s))  APTT     Status: Abnormal   Collection Time: 02/07/16  9:07 AM  Result Value Ref Range   aPTT 50 (H) 24 - 36 seconds    Comment:        IF BASELINE aPTT IS ELEVATED, SUGGEST PATIENT RISK ASSESSMENT BE USED TO DETERMINE APPROPRIATE ANTICOAGULANT THERAPY.   Protime-INR     Status: None   Collection Time: 02/07/16  9:07 AM  Result Value Ref Range   Prothrombin Time 12.7 11.4 - 15.2 seconds    INR 0.96   CBC     Status: Abnormal   Collection Time: 02/07/16  9:07 AM  Result Value Ref Range   WBC 3.4 (L) 4.0 - 10.5 K/uL   RBC 2.86 (L) 3.87 - 5.11 MIL/uL   Hemoglobin 9.5 (L) 12.0 - 15.0 g/dL   HCT 29.6 (L) 36.0 - 46.0 %   MCV 103.5 (H) 78.0 - 100.0 fL   MCH 33.2 26.0 - 34.0 pg   MCHC 32.1 30.0 - 36.0 g/dL   RDW 19.3 (H) 11.5 - 15.5 %   Platelets 166 150 - 400 K/uL   *Note: Due to a large number of results and/or encounters for the requested time period, some results have not been displayed. A complete set of results can be found in Results Review.   No results found.  Review of Systems  Constitutional: Negative for chills, fever and weight loss.  Respiratory: Negative for cough, hemoptysis, shortness of breath and wheezing.   Cardiovascular: Negative for chest pain and palpitations.    Blood pressure (!) 113/59, pulse 78, temperature 97.8 F (36.6 C), temperature source Oral, resp. rate 16, height 5' (1.524 m), weight 54.7 kg (120 lb 8 oz), SpO2 100 %. Physical Exam  Gen: Pleasant, well-nourished, in no distress,  normal affect  ENT: No lesions,  mouth clear,  oropharynx clear, no postnasal drip  Neck: No JVD, no TMG, no carotid bruits  Lungs: No use of accessory muscles, no dullness to percussion, clear without rales or rhonchi  Cardiovascular: RRR, heart sounds normal, no murmur or gallops, no peripheral edema  Abdomen: soft and NT, no HSM,  BS normal  Musculoskeletal: No deformities, no cyanosis or clubbing  Neuro: alert, non focal  Skin: Warm, no lesions or rashes   Assessment/Plan 82 yo woman, former smoker, hx breast CA and  cardiac issues as outlined above, with newly discovered R hilar mass concerning for primary lung CA.   R hilar mass - will plan for tissue biopsy via EBUS this am  HTN - hold anti-HTN regimen for 11/8, plan restart on 11/9   St Jude aortic valve and Atrial fib - enoxaparin on hold until pm dose 11/9  Hx chronic anemia and  GI AVM's - she is scheduled to get transfusion PRBC on 11/9 (can do as an outpt if plan is for her to be d/c to home before her scheduled appt. Otherwise will do while admitted.)   Collene Gobble., MD 02/07/2016, 10:12 AM

## 2016-02-07 NOTE — Anesthesia Procedure Notes (Signed)
Procedure Name: Intubation Date/Time: 02/07/2016 11:12 AM Performed by: Rush Farmer E Pre-anesthesia Checklist: Patient identified, Emergency Drugs available, Suction available and Patient being monitored Patient Re-evaluated:Patient Re-evaluated prior to inductionOxygen Delivery Method: Circle system utilized Preoxygenation: Pre-oxygenation with 100% oxygen Intubation Type: IV induction and Cricoid Pressure applied Ventilation: Mask ventilation without difficulty Laryngoscope Size: Mac and 3 Grade View: Grade I Tube type: Oral Tube size: 8.5 mm Number of attempts: 1 Airway Equipment and Method: Stylet Placement Confirmation: ETT inserted through vocal cords under direct vision,  positive ETCO2 and breath sounds checked- equal and bilateral Secured at: 19 cm Tube secured with: Tape Dental Injury: Teeth and Oropharynx as per pre-operative assessment

## 2016-02-08 ENCOUNTER — Ambulatory Visit (HOSPITAL_COMMUNITY)
Admission: RE | Admit: 2016-02-08 | Discharge: 2016-02-08 | Disposition: A | Discharge status: Home / Self Care | Payer: Medicare Other | Source: Ambulatory Visit | Attending: Oncology | Admitting: Oncology

## 2016-02-08 ENCOUNTER — Other Ambulatory Visit (HOSPITAL_BASED_OUTPATIENT_CLINIC_OR_DEPARTMENT_OTHER): Payer: Medicare Other

## 2016-02-08 ENCOUNTER — Encounter (HOSPITAL_COMMUNITY): Payer: Self-pay | Admitting: Emergency Medicine

## 2016-02-08 DIAGNOSIS — I119 Hypertensive heart disease without heart failure: Secondary | ICD-10-CM

## 2016-02-08 DIAGNOSIS — D62 Acute posthemorrhagic anemia: Secondary | ICD-10-CM

## 2016-02-08 DIAGNOSIS — K921 Melena: Secondary | ICD-10-CM

## 2016-02-08 DIAGNOSIS — D500 Iron deficiency anemia secondary to blood loss (chronic): Secondary | ICD-10-CM

## 2016-02-08 DIAGNOSIS — D689 Coagulation defect, unspecified: Secondary | ICD-10-CM

## 2016-02-08 DIAGNOSIS — K922 Gastrointestinal hemorrhage, unspecified: Secondary | ICD-10-CM

## 2016-02-08 DIAGNOSIS — D649 Anemia, unspecified: Secondary | ICD-10-CM

## 2016-02-08 DIAGNOSIS — Z01812 Encounter for preprocedural laboratory examination: Secondary | ICD-10-CM | POA: Diagnosis not present

## 2016-02-08 DIAGNOSIS — I4819 Other persistent atrial fibrillation: Secondary | ICD-10-CM

## 2016-02-08 LAB — CBC WITH DIFFERENTIAL/PLATELET
BASO%: 0.4 % (ref 0.0–2.0)
Basophils Absolute: 0 10*3/uL (ref 0.0–0.1)
EOS%: 0.8 % (ref 0.0–7.0)
Eosinophils Absolute: 0 10*3/uL (ref 0.0–0.5)
HEMATOCRIT: 26.5 % — AB (ref 34.8–46.6)
HEMOGLOBIN: 8.5 g/dL — AB (ref 11.6–15.9)
LYMPH#: 0.4 10*3/uL — AB (ref 0.9–3.3)
LYMPH%: 9.6 % — ABNORMAL LOW (ref 14.0–49.7)
MCH: 33.2 pg (ref 25.1–34.0)
MCHC: 32.2 g/dL (ref 31.5–36.0)
MCV: 103.2 fL — ABNORMAL HIGH (ref 79.5–101.0)
MONO#: 0.3 10*3/uL (ref 0.1–0.9)
MONO%: 6.6 % (ref 0.0–14.0)
NEUT%: 82.6 % — AB (ref 38.4–76.8)
NEUTROS ABS: 3.3 10*3/uL (ref 1.5–6.5)
PLATELETS: 154 10*3/uL (ref 145–400)
RBC: 2.56 10*6/uL — ABNORMAL LOW (ref 3.70–5.45)
RDW: 20.7 % — ABNORMAL HIGH (ref 11.2–14.5)
WBC: 4 10*3/uL (ref 3.9–10.3)

## 2016-02-08 LAB — FERRITIN: Ferritin: 186 ng/ml (ref 9–269)

## 2016-02-08 LAB — PREPARE RBC (CROSSMATCH)

## 2016-02-08 MED ORDER — SODIUM CHLORIDE 0.9% FLUSH: 10.0000 mL | INTRAVENOUS | Status: DC | PRN

## 2016-02-08 MED ORDER — HEPARIN SOD (PORK) LOCK FLUSH 100 UNIT/ML IV SOLN: 250.0000 [IU] | INTRAVENOUS | Status: DC | PRN

## 2016-02-08 MED ORDER — ACETAMINOPHEN 325 MG PO TABS
650.0000 mg | ORAL_TABLET | Freq: Once | ORAL | Status: AC
  Administered 2016-02-08: 650 mg via ORAL
  Filled 2016-02-08: qty 2

## 2016-02-08 MED ORDER — SODIUM CHLORIDE 0.9 % IV SOLN
250.0000 mL | Freq: Once | INTRAVENOUS | Status: AC
  Administered 2016-02-08: 250 mL via INTRAVENOUS

## 2016-02-08 MED ORDER — SODIUM CHLORIDE 0.9% FLUSH: 3.0000 mL | INTRAVENOUS | Status: DC | PRN

## 2016-02-08 MED ORDER — HEPARIN SOD (PORK) LOCK FLUSH 100 UNIT/ML IV SOLN
500.0000 [IU] | Freq: Every day | INTRAVENOUS | Status: AC | PRN
  Administered 2016-02-08: 500 [IU]
  Filled 2016-02-08: qty 5

## 2016-02-08 MED ORDER — DIPHENHYDRAMINE HCL 25 MG PO CAPS
25.0000 mg | ORAL_CAPSULE | Freq: Once | ORAL | Status: AC
  Administered 2016-02-08: 25 mg via ORAL
  Filled 2016-02-08: qty 1

## 2016-02-08 NOTE — Discharge Instructions (Signed)
Blood Transfusion, Care After  These instructions give you information about caring for yourself after your procedure. Your doctor may also give you more specific instructions. Call your doctor if you have any problems or questions after your procedure.   HOME CARE   Take medicines only as told by your doctor. Ask your doctor if you can take an over-the-counter pain reliever if you have a fever or headache a day or two after your procedure.   Return to your normal activities as told by your doctor.  GET HELP IF:    You develop redness or irritation at your IV site.   You have a fever, chills, or a headache that does not go away.   Your pee (urine) is darker than normal.   Your urine turns:    Pink.    Red.    Brown.   The white part of your eye turns yellow (jaundice).   You feel weak after doing your normal activities.  GET HELP RIGHT AWAY IF:    You have trouble breathing.   You have fever and chills and you also have:    Anxiety.    Chest or back pain.    Flushed or pink skin.    Clammy or sweaty skin.    A fast heartbeat.    A sick feeling in your stomach (nausea).     This information is not intended to replace advice given to you by your health care provider. Make sure you discuss any questions you have with your health care provider.     Document Released: 04/08/2014 Document Reviewed: 04/08/2014  Elsevier Interactive Patient Education 2016 [employer]

## 2016-02-08 NOTE — Progress Notes (Addendum)
Procedure: Transfusion of 1 unit PRBCS  Ordering Provider: G. Magrinat  Diagnosis: Symptomatic anemia (D64.9)  Pt received 1 unit of PRBCs; pt tolerated well; port deaccessed with no complications noted; discharge instructions given

## 2016-02-09 ENCOUNTER — Telehealth: Payer: Self-pay | Admitting: Oncology

## 2016-02-09 ENCOUNTER — Ambulatory Visit (HOSPITAL_BASED_OUTPATIENT_CLINIC_OR_DEPARTMENT_OTHER): Payer: Medicare Other | Admitting: Oncology

## 2016-02-09 ENCOUNTER — Other Ambulatory Visit: Payer: Self-pay | Admitting: *Deleted

## 2016-02-09 ENCOUNTER — Telehealth: Payer: Self-pay | Admitting: *Deleted

## 2016-02-09 ENCOUNTER — Encounter: Payer: Self-pay | Admitting: *Deleted

## 2016-02-09 ENCOUNTER — Telehealth: Payer: Self-pay | Admitting: Emergency Medicine

## 2016-02-09 VITALS — BP 135/62 | HR 100 | Temp 97.9°F | Resp 20 | Ht 60.0 in | Wt 121.4 lb

## 2016-02-09 DIAGNOSIS — C3411 Malignant neoplasm of upper lobe, right bronchus or lung: Secondary | ICD-10-CM

## 2016-02-09 DIAGNOSIS — I4891 Unspecified atrial fibrillation: Secondary | ICD-10-CM | POA: Diagnosis not present

## 2016-02-09 DIAGNOSIS — K922 Gastrointestinal hemorrhage, unspecified: Secondary | ICD-10-CM

## 2016-02-09 DIAGNOSIS — Z7901 Long term (current) use of anticoagulants: Secondary | ICD-10-CM | POA: Diagnosis not present

## 2016-02-09 DIAGNOSIS — D500 Iron deficiency anemia secondary to blood loss (chronic): Secondary | ICD-10-CM | POA: Diagnosis present

## 2016-02-09 DIAGNOSIS — C3491 Malignant neoplasm of unspecified part of right bronchus or lung: Secondary | ICD-10-CM

## 2016-02-09 DIAGNOSIS — Z952 Presence of prosthetic heart valve: Secondary | ICD-10-CM

## 2016-02-09 MED ORDER — DEXAMETHASONE 4 MG PO TABS: 8.0000 mg | ORAL_TABLET | Freq: Two times a day (BID) | ORAL | 3 refills | Status: DC

## 2016-02-09 MED ORDER — AZITHROMYCIN 250 MG PO TABS
250.0000 mg | ORAL_TABLET | Freq: Every day | ORAL | Status: DC
Start: 2016-02-10 — End: 2016-02-09

## 2016-02-09 MED ORDER — PROMETHAZINE-CODEINE 6.25-10 MG/5ML PO SYRP: 5.0000 mL | ORAL_SOLUTION | Freq: Three times a day (TID) | ORAL | 0 refills | Status: DC | PRN

## 2016-02-09 MED ORDER — AZITHROMYCIN 250 MG PO TABS: 500.0000 mg | ORAL_TABLET | Freq: Every day | ORAL | Status: DC

## 2016-02-09 NOTE — Telephone Encounter (Signed)
This RN was contacted by [name] - pt's daughter - who states concern due to ongoing cough, ( non productive ) wheezing with chest tightness.  Pt is afebrile.  Concern is due to biopsy under bronch on 11/8 as well as 1 unit PRBC's yesterday.  Tameka has taken her am dose of lasix 40 mg.  [name] has left message with pulmonary but has not had a return call.  Per above- MD recommendation is for pt obtain CXR and then come in for MD visit.  Above discussed with [name] who will proceed to radiology and then come here.  Appointments made by this RN.

## 2016-02-09 NOTE — Telephone Encounter (Signed)
Spoke with the patient, she saw Dr Griffith Citron today, was started on pred + azithro for a possible bronchitis post-FOB which I believe is a good plan. She will be referred to see Dr Julien Nordmann to discuss therapy for [facility].

## 2016-02-09 NOTE — Telephone Encounter (Signed)
Central radiology will call re scan per 11/7 los. Per los f/u to be scheduled at later time.

## 2016-02-09 NOTE — Telephone Encounter (Signed)
Lmtcb. Will await call back 

## 2016-02-09 NOTE — Telephone Encounter (Signed)
Called patient's home number, no answer so left message. Will try them again.

## 2016-02-09 NOTE — Patient Outreach (Signed)
Transition of care call. I spoke with pt's daughter, Dixon Boos, who reports her mother is fine. [name] reported that her mother had a doctor appt today and they are doing fine. She thanked me for calling.  I will call again next week. It will be one month since I have been providing transition of care calls and should be closing the case, however, I am concerned Mrs. [name] may need more support with her recent lung cancer diagnosis.  Deloria Lair [facility] [place] [phone]

## 2016-02-09 NOTE — Progress Notes (Signed)
ID: [name]   DOB: 10-10-1926  MR#: [id number]  LZJ#:[id number]  PCP: Mathews Argyle, MD GYN:  SU:  OTHER MD: Clarene Essex, Rema Jasmine   HISTORY OF PRESENT ILLNESS: From the earlier summary:  Deborah [name] is an 82 year old [place] woman referred April 2014  by Dr. Watt Climes for evaluation and treatment of iron deficiency anemia. The patient has a documented history of chronic GI bleeding, with prior GI evaluation showing a hiatal hernia, multiple gastric polyps, and diverticular disease. She has been treated with various iron preparations, most recently with Niferex, with poor tolerance. Despite everyone's best efforts her hemoglobin has continued to drop, from 12.4 on 06/03/2012 to10.6 on 07/01/2012 and 9.6.  The GI-bleeding problem is complicated by a history of AVR and A-fib, on chronic anticoagulation. She had what appears to have been a TIA September 2016.  Her subsequent history is as detailed below.  INTERVAL HISTORY: Deborah [name] came today at the request of her daughter Deborah [name], because did see was having more cough, shortness of breath, and feeling bad. Of course she had a bronchoscopy 2 days ago, and she is having a little bit of hemoptysis secondary to this. This does not alarm them. Quite aside from that though Deborah [name] feels her chest is tighter, she has more trouble breathing, and she has been wheezing more. She has a cough, which is occasionally productive of "murky" sputum. She feels extremely tired. She has had a mild headache. Her eyes are "okay". She has had no nausea or vomiting problem. She has had no change in bowel or bladder habits. She denies pain.  REVIEW OF SYSTEMS: A detailed review of systems today is negative except as noted above.  PAST MEDICAL HISTORY: Past Medical History:  Diagnosis Date  . Acute CHF (congestive heart failure) ([place]) 01/10/2016  . Anemia   . Arthritis    "fingers, toes, hips; qwhere" (05/19/2013)  . Breast cancer  ([place])    "right; S/P lumpectomy, chemo, XRT"   . Chronic lower GI bleeding   . Complication of anesthesia    "once I'm awakened, I don't sleep til the following night" BP drops very low had to stop putting in [place] -a- cath  . GERD (gastroesophageal reflux disease)   . Gout   . Heart murmur   . High cholesterol   . History of blood transfusion 1996; ~ 2013   "related to valve replacement; GI bleeding"   . History of echocardiogram    Echo 1/17: mild LVH, EF 50-55%, no RWMA, mild AI, mechanical AVR with mean 21 mmHg/peak 42 mmHg (stable since 2012), mod MR, mod to severe BAE, PASP 36 mmHg  . History of nuclear stress test    Myoview 1/17: EF 59%, normal perfusion, low risk  . Hypertension   . Hypothyroidism   . On supplemental oxygen therapy   . Permanent atrial fibrillation ([place])       . Pneumonia 07/2011  . Rectus sheath hematoma 12/19/14    held anticoagulation for 1 week, transfused  . Stomach problems    seeing Dr Watt Climes  . TIA (transient ischemic attack) 12/24/14   symptoms resolved    PAST SURGICAL HISTORY: Past Surgical History:  Procedure Laterality Date  . ABDOMINAL HYSTERECTOMY    . AORTIC VALVE REPLACEMENT  1996   [employer]  . APPENDECTOMY    . arteriogram  01/12   NO RAS   . BREAST BIOPSY Right   . BREAST LUMPECTOMY Right   .  CARDIAC CATHETERIZATION    . CARDIOVERSION     X 2  . CATARACT EXTRACTION W/ INTRAOCULAR LENS  IMPLANT, BILATERAL Bilateral    bilateral cataract with lens implants  . CHOLECYSTECTOMY    . COLONOSCOPY WITH PROPOFOL N/A 11/24/2012   Procedure: COLONOSCOPY WITH PROPOFOL;  Surgeon: Jeryl Columbia, MD;  Location: [facility] ENDOSCOPY;  Service: Endoscopy;  Laterality: N/A;  . ESOPHAGOGASTRODUODENOSCOPY (EGD) WITH PROPOFOL N/A 07/25/2015   Procedure: ESOPHAGOGASTRODUODENOSCOPY (EGD) WITH PROPOFOL;  Surgeon: Clarene Essex, MD;  Location: [facility] ENDOSCOPY;  Service: Endoscopy;  Laterality: N/A;  . HOT HEMOSTASIS N/A 07/25/2015   Procedure: HOT HEMOSTASIS (ARGON  PLASMA COAGULATION/BICAP);  Surgeon: Clarene Essex, MD;  Location: [facility] ENDOSCOPY;  Service: Endoscopy;  Laterality: N/A;  . VIDEO BRONCHOSCOPY WITH ENDOBRONCHIAL ULTRASOUND N/A 02/07/2016   Procedure: VIDEO BRONCHOSCOPY WITH ENDOBRONCHIAL ULTRASOUND;  Surgeon: Collene Gobble, MD;  Location: [facility] OR;  Service: Thoracic;  Laterality: N/A;    FAMILY HISTORY Family History  Problem Relation Age of Onset  . Dementia Mother   . Coronary artery disease Father   . Heart attack Father   . Breast cancer Daughter   . Colon cancer Sister   . Liver cancer Sister   . Lung cancer Brother   . COPD Brother    the patient's father died from a myocardial infarction at the age of 62. The patient's mother died with Alzheimer's disease at the age of 71. She had 2 brothers and one sister. Her sister had colon cancer diagnosed at age 63. Otherwise the only other person with cancer in the family was the patient's mother's twin sister, diagnosed with breast cancer at age 47.  GYNECOLOGIC HISTORY: Menarche age 26, first live birth age 59, she is [place] P3. Underwent menopause 1972. She took hormone replacement for approximately 21 years.  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 Her daughter Deborah [name] runs the [employer] here. Daughter Deborah [name] also lives in [place], is a homemaker. Son Deborah [name] is a Scientist, research (medical). The patient has of 10 grandchildren She attends a [employer].    ADVANCED DIRECTIVES: Living will in place. Her daughter Deborah [name] is her healthcare power of attorney.  HEALTH MAINTENANCE: Social History  Substance Use Topics  . Smoking status: Former Smoker    Packs/day: 1.00    Years: 20.00    Types: Cigarettes    Quit date: 04/01/1978  . Smokeless tobacco: Never Used  . Alcohol use 4.8 oz/week    4 Glasses of wine, 4 Shots of liquor per week     Comment: burbon or glass wine daily     Allergies  Allergen Reactions   . Alendronate Sodium Other (See Comments)    GI PAIN CAUTION: RISK FOR PERFORATION  . Amiodarone Shortness Of Breath  . Chlorthalidone Shortness Of Breath    ABDOMINAL BLOATING  . Macrodantin [Nitrofurantoin] Nausea And Vomiting and Other (See Comments)    CONTRAINDICATED WITH GFR < 50  . Meclizine Swelling    SWELLING OF TONGUE  . Norvasc [Amlodipine Besylate] Swelling    SWELLING REACTION UNSPECIFIED   . Hydralazine Hcl Nausea Only  . Sulfa Antibiotics Other (See Comments)    Unknown  . Tussionex Pennkinetic Er [Hydrocod Polst-Cpm Polst Er] Other (See Comments)    UNSPECIFIED REACTION     Current Outpatient Prescriptions  Medication Sig Dispense Refill  . acetaminophen (TYLENOL) 500 MG tablet Take 1,000 mg by mouth every 6 (six) hours  as needed for mild pain, moderate pain or headache. Reported on 09/26/2015    . allopurinol (ZYLOPRIM) 300 MG tablet Take 300 mg by mouth at bedtime.     Marland Kitchen amLODipine (NORVASC) 5 MG tablet Take 5 mg by mouth at bedtime.    . calcium-vitamin D 500 MG tablet Take 1 tablet by mouth 2 (two) times daily. Reported on 09/26/2015    . carvedilol (COREG) 6.25 MG tablet TAKE 6.25 MG BY MOUTH TWICE DAILY 60 tablet 1  . celecoxib (CELEBREX) 200 MG capsule Take 200 mg by mouth daily as needed for moderate pain.   4  . cloNIDine (CATAPRES) 0.1 MG tablet Take 0.1 mg by mouth daily.     Marland Kitchen dexamethasone (DECADRON) 4 MG tablet Take 2 tablets (8 mg total) by mouth 2 (two) times daily with a meal. 40 tablet 3  . dicyclomine (BENTYL) 10 MG capsule Take 10 mg by mouth daily as needed for spasms.     Marland Kitchen enoxaparin (LOVENOX) 60 MG/0.6ML injection INJECT 0.69m IN TO SKIN EVERY 12 HOURS 60 Syringe 0  . fexofenadine (ALLEGRA ALLERGY) 180 MG tablet Take 180 mg by mouth daily.     . furosemide (LASIX) 40 MG tablet Take 1-2 tablets daily as directed. (Patient taking differently: Take 40 mg by mouth 2 (two) times daily as needed for fluid. ) 45 tablet 6  . hydrOXYzine  (ATARAX/VISTARIL) 25 MG tablet Take 25 mg by mouth 3 (three) times daily as needed for anxiety.    .Marland Kitchenlevothyroxine (SYNTHROID, LEVOTHROID) 88 MCG tablet Take 1 tablet (88 mcg total) by mouth daily before breakfast. 90 tablet 3  . NITROSTAT 0.4 MG SL tablet Place 0.4 mg under the tongue every 5 (five) minutes as needed for chest pain (x 3 doses). Reported on 10/17/2015  1  . OXYGEN Inhale 2 L into the lungs continuous.    . potassium chloride SA (K-DUR,KLOR-CON) 20 MEQ tablet Take 0.5 tablets (10 mEq total) by mouth 2 (two) times daily as needed (taking with Lasix).    . Probiotic Product (PROBIOTIC ADVANCED PO) Take 1 tablet by mouth daily. Reported on 10/17/2015    . promethazine-codeine (PHENERGAN WITH CODEINE) 6.25-10 MG/5ML syrup Take 5 mLs by mouth every 8 (eight) hours as needed for cough. 120 mL 0  . simvastatin (ZOCOR) 20 MG tablet Take 10 mg by mouth daily.     . valsartan (DIOVAN) 320 MG tablet Take 1 tablet (320 mg total) by mouth daily. 30 tablet 5  . zolpidem (AMBIEN) 5 MG tablet Take 5 mg by mouth at bedtime.      No current facility-administered medications for this visit.     OBJECTIVE: Elderly white woman who appears frail Vitals:   02/09/16 1353  BP: 135/62  Pulse: 100  Resp: 20  Temp: 97.9 F (36.6 C)     Body mass index is 23.71 kg/m.    ECOG FS: 2  .   Saturation on O2 at 97%  Brought to the examination room in a wheelchair, with a portable oxygen pump and nasal cannula in place Sclerae unicteric, EOMs intact Oropharynx slightly dry No cervical or supraclavicular adenopathy Lungs show rales bilaterally, but no significant dullness to percussion, rare wheezes Heart regular rate and rhythm Abd soft, nontender, positive bowel sounds MSK no focal spinal tenderness Neuro: nonfocal, well oriented, appropriate affect Breasts: Deferred   Lab Results  Component Value Date   WBC 4.0 02/08/2016   NEUTROABS 3.3 02/08/2016   HGB 8.5 (L)  02/08/2016   HCT 26.5 (L)  02/08/2016   MCV 103.2 (H) 02/08/2016   PLT 154 02/08/2016      Chemistry      Component Value Date/Time   NA 132 (L) 02/05/2016 1535   NA 136 01/15/2016 1108   K 4.5 02/05/2016 1535   K 4.5 01/15/2016 1108   CL 96 (L) 02/05/2016 1535   CO2 28 02/05/2016 1535   CO2 25 01/15/2016 1108   BUN 41 (H) 02/05/2016 1535   BUN 76.2 (H) 01/15/2016 1108   CREATININE 1.16 (H) 02/05/2016 1535   CREATININE 1.3 (H) 01/15/2016 1108      Component Value Date/Time   CALCIUM 9.7 02/05/2016 1535   CALCIUM 10.9 (H) 01/15/2016 1108   ALKPHOS 40 02/05/2016 1535   ALKPHOS 37 (L) 01/15/2016 1108   AST 24 02/05/2016 1535   AST 15 01/15/2016 1108   ALT 22 02/05/2016 1535   ALT 19 01/15/2016 1108   BILITOT 0.4 02/05/2016 1535   BILITOT 0.30 01/15/2016 1108      No results found for: LABCA2  No components found for: LABCA125   Recent Labs Lab 02/07/16 0907  INR 0.96    Urinalysis    Component Value Date/Time   COLORURINE YELLOW 12/27/2015 1254   APPEARANCEUR CLEAR 12/27/2015 1254   LABSPEC 1.007 12/27/2015 1254   LABSPEC 1.010 03/22/2015 0917   PHURINE 6.5 12/27/2015 1254   GLUCOSEU NEGATIVE 12/27/2015 1254   GLUCOSEU Negative 03/22/2015 0917   HGBUR NEGATIVE 12/27/2015 1254   BILIRUBINUR NEGATIVE 12/27/2015 1254   BILIRUBINUR Negative 03/22/2015 0917   KETONESUR NEGATIVE 12/27/2015 1254   PROTEINUR NEGATIVE 12/27/2015 1254   UROBILINOGEN 0.2 03/22/2015 0917   NITRITE NEGATIVE 12/27/2015 1254   LEUKOCYTESUR NEGATIVE 12/27/2015 1254   LEUKOCYTESUR Negative 03/22/2015 0917   STUDIES: Nm Pet Image Initial (pi) Skull Base To Thigh  Result Date: 01/25/2016 CLINICAL DATA:  Initial treatment strategy for right upper lobe lung mass detected on recent chest CT angiogram performed in the setting of chest tightness and shortness of breath. History of breast cancer. EXAM: NUCLEAR MEDICINE PET SKULL BASE TO THIGH TECHNIQUE: 5.8 mCi F-18 FDG was injected intravenously. Full-ring PET imaging  was performed from the skull base to thigh after the radiotracer. CT data was obtained and used for attenuation correction and anatomic localization. FASTING BLOOD GLUCOSE:  Value: 109 mg/dl COMPARISON:  01/10/2016 chest CT angiogram. 08/05/2015 CT abdomen/pelvis. FINDINGS: NECK No hypermetabolic lymph nodes in the neck. CHEST Hypermetabolic central right upper lobe 5.1 x 3.7 cm lung mass with max SUV 15.4 (series 8/image 29), which encases and occludes the associated segmental right upper lobe bronchus, and which is confluent with hypermetabolic right hilar and right lower paratracheal adenopathy. Patchy ground-glass opacity in the anterior right upper lobe is most consistent with postobstructive pneumonitis. Separate sharply marginated focus of subpleural reticulation in the anterior right upper lobe is consistent with mild radiation fibrosis. Mild patchy tree-in-bud opacities in the anterior left upper lobe with associated low level metabolism (max SUV 2.7), stable since 01/10/2016, favor mild infectious or inflammatory bronchiolitis. No additional significant pulmonary nodules. Small layering right pleural effusion. No left pleural effusion. No pleural nodularity or hypermetabolism. Hypermetabolic right paratracheal adenopathy, for example a 2.7 cm right lower paratracheal node with max SUV 13.6 (series 4/image 50). No contralateral mediastinal or contralateral hilar hypermetabolic adenopathy. Surgical clips are again noted in the right axilla. No hypermetabolic axillary lymph nodes. Duplicated superior vena cava. Left internal jugular MediPort terminates within the  lower third of the left superior vena cava. Moderate cardiomegaly. Left main, left anterior descending, left circumflex and right coronary atherosclerosis. Stable atherosclerotic aneurysmal ascending thoracic aorta, maximum diameter 4.8 cm. Aberrant nonaneurysmal right subclavian artery arising from the distal aortic arch with retroesophageal course.  Stable prominently dilated main pulmonary artery (4.7 cm diameter). ABDOMEN/PELVIS Left adrenal 1.7 cm hypermetabolic nodule with max SUV 12.0 (series 4/image 95), new since 08/05/2015. No right adrenal hypermetabolism or nodularity. Focus of hypermetabolism in the lateral left lower ventral abdominal muscle wall with associated vague low-attenuation 1.4 cm lesion on the CT images with max SUV 7.1 (series 4/image 123). Focal hypermetabolism at the anorectal junction with max SUV 6.6, without discrete mass or definite wall thickening on the CT images. No abnormal hypermetabolic activity within the liver, pancreas, or spleen. No hypermetabolic lymph nodes in the abdomen or pelvis. Cholecystectomy. Atherosclerotic nonaneurysmal abdominal aorta. Grossly stable enlarged polycystic kidneys with scattered subcentimeter hyperdense renal cortical lesions throughout both kidneys, too small to characterize. Largest simple renal cyst measures 6.0 cm in the far lower right [name] and 7.4 cm in the lower left [name]. Hysterectomy. SKELETON No focal hypermetabolic activity to suggest skeletal metastasis. Sternotomy wires appear aligned and intact. IMPRESSION: 1. Hypermetabolic central right upper lobe 4.1 cm lung mass, which is confluent with hypermetabolic right hilar and right paratracheal adenopathy. Hypermetabolic left adrenal nodule. Favor stage IV (T2b N2 M1b) primary bronchogenic carcinoma. 2. Possible small soft tissue metastasis in the lateral left lower ventral abdominal muscle wall. 3. Nonspecific focal hypermetabolism at the anorectal junction without definite CT correlate, favor physiologic uptake, although a neoplasm cannot be excluded and clinical correlation is necessary. 4. Mild patchy tree-in-bud opacity in the anterior left upper lobe with associated low level metabolism, favor mild infectious or inflammatory bronchiolitis. 5. Postobstructive pneumonitis in the right upper lobe. 6. Cardiomegaly. Small layering  right pleural effusion. No appreciable pleural nodularity or hypermetabolism . 7. Duplicated SVC. Aberrant right subclavian artery. Aortic atherosclerosis. Stable ascending thoracic aortic aneurysm, maximum diameter 4.8 cm. Stable prominently dilated main pulmonary artery suggesting chronic pulmonary arterial hypertension. 8. Polycystic kidneys. Electronically Signed   By: Ilona Sorrel M.D.   On: 01/25/2016 16:00   Dg Chest Port 1 View  Result Date: 01/11/2016 CLINICAL DATA:  82 year old female with history of right-sided hilar mass and right pleural effusion status post right-sided thoracentesis. EXAM: PORTABLE CHEST 1 VIEW COMPARISON:  Chest x-ray 01/10/2016. FINDINGS: Small right pleural effusion. No appreciable pneumothorax. No acute consolidative airspace disease. No evidence of pulmonary edema. Mild cardiomegaly. The patient is rotated to the right on today's exam, resulting in distortion of the mediastinal contours and reduced diagnostic sensitivity and specificity for mediastinal pathology. Fullness in the right hilar region compatible with known right hilar mass. Atherosclerosis in the thoracic aorta. Left-sided internal jugular single-lumen porta cath with tip terminating in the patient's persistent left superior vena cava. Surgical clips in the right axilla, presumably from prior axillary nodal dissection. IMPRESSION: 1. Small residual right-sided pleural effusion following thoracentesis. No appreciable pneumothorax. 2. Otherwise, the radiographic appearance the chest is essentially unchanged, as above. Electronically Signed   By: Vinnie Langton M.D.   On: 01/11/2016 11:56     ASSESSMENT: 82 y.o. [place] with iron deficiency anemia secondary to chronic blood loss, s/p oral iron supplementation with poor tolerance and inadequate response  (1) Feraheme started 07/24/2012, repeated whenever ferritin drops <100, mosr recent dose 07/04/2015  (2) transfusing PBBCs for Hb < 9.0 or  symptoms  (3)  chronic GIB extensively evaluated by GI with findings including AVM (cauterized), diverticular disease, hemorrhoids and gastric polyps  (4) on lifelong anticoagulation for AFib and mechanical aortic valve  (a) on warfarin until June 2016, when she was switched to Lovenox  (b) Lovenox discontinued September 2016 after rectus sheath hematoma developed  (c) warfarin resumed 12/27/2014 goal being an INR between 2.0 and 2.5  LUNG CANCER (5) CT chest and PET scan October 2017 sure 4.1 cm right upper lobe lung mass confluent with right hilar and paratracheal adenopathy, with a left adrenal nodule and evidence of postobstructive pneumonitis. There is a small A right pleural effusion  (a) cytology from bronchoscopy 02/07/2016 confirmed small cell lung cancer  PLAN: Deborah [name] is having either progression of her lung cancer or perhaps more likely a COPD exacerbation. She is stable as far as her vitals are concerned and I don't think we are obligated to admit her although I gave her that choice (which she adamantly refused).  Instead we're going to start a Z-Pak and dexamethasone. More specifically she will take 8 mg twice daily of dexamethasone for the next day and a half. If by Sunday 02/11/2016 she is feeling better she can cut that down to 4 mg twice daily. I also wrote her for Phenergan/codeine syrup for cough control.  I have alerted Dr. Earlie Server and his team as to the diagnosis so they can evaluate and advise the patient regarding her treatment options. Deborah [name] is very clear that she does not want anything that is going to "rowing my quality of life", but she might be willing to accept at least a trial of chemotherapy if it comes to that. She does need to discuss this with her lung cancer physician however.  I did let her know that if her condition worsens over the weekend she will have to go to the emergency room  Otherwise she will return to see me in about a month, by which time she  should have a definite treatment plan or if she refuses treatment should be under the care of hospice  Of course we are continuing to follow her hemoglobin and ferritin as before and continuing supportive transfusions and iron infusions as needed.  Chandlor Noecker C    02/09/2016

## 2016-02-09 NOTE — Telephone Encounter (Signed)
Attempted to call patient and her daughter. Unable to reach them, left message with the patient's daughter. Will try them again.

## 2016-02-09 NOTE — Progress Notes (Signed)
Oncology Nurse Navigator Documentation  Oncology Nurse Navigator Flowsheets 02/09/2016  Navigator Location [facility]  Navigator Encounter Type Other/I received a call from Dr. Jana Hakim.  He updated me on patient status as well as cytology.  I updated Dr. Julien Nordmann and asked when to schedule a follow up with him next week.   Treatment Phase Pre-Tx/Tx Discussion  Barriers/Navigation Needs Coordination of Care  Interventions Coordination of Care  Coordination of Care Appts  Acuity Level 2  Acuity Level 2 Assistance expediting appointments  Time Spent with Patient 15

## 2016-02-10 LAB — TYPE AND SCREEN
ABO/RH(D): O POS
Antibody Screen: NEGATIVE
UNIT DIVISION: 0
Unit division: 0

## 2016-02-12 ENCOUNTER — Emergency Department (HOSPITAL_COMMUNITY): Payer: Medicare Other

## 2016-02-12 ENCOUNTER — Encounter (HOSPITAL_COMMUNITY): Payer: Self-pay | Admitting: Emergency Medicine

## 2016-02-12 ENCOUNTER — Ambulatory Visit: Payer: Medicare Other

## 2016-02-12 ENCOUNTER — Telehealth: Payer: Self-pay | Admitting: *Deleted

## 2016-02-12 ENCOUNTER — Emergency Department (HOSPITAL_COMMUNITY)
Admission: EM | Admit: 2016-02-12 | Discharge: 2016-02-12 | Disposition: A | Discharge status: Home / Self Care | Payer: Medicare Other | Attending: Emergency Medicine | Admitting: Emergency Medicine

## 2016-02-12 ENCOUNTER — Ambulatory Visit: Payer: Medicare Other | Admitting: Oncology

## 2016-02-12 ENCOUNTER — Ambulatory Visit (HOSPITAL_COMMUNITY): Admission: RE | Admit: 2016-02-12 | Payer: Medicare Other | Source: Ambulatory Visit

## 2016-02-12 ENCOUNTER — Other Ambulatory Visit: Payer: Self-pay | Admitting: Oncology

## 2016-02-12 DIAGNOSIS — Z85118 Personal history of other malignant neoplasm of bronchus and lung: Secondary | ICD-10-CM | POA: Diagnosis not present

## 2016-02-12 DIAGNOSIS — Z79899 Other long term (current) drug therapy: Secondary | ICD-10-CM | POA: Diagnosis not present

## 2016-02-12 DIAGNOSIS — I509 Heart failure, unspecified: Secondary | ICD-10-CM | POA: Diagnosis not present

## 2016-02-12 DIAGNOSIS — Z853 Personal history of malignant neoplasm of breast: Secondary | ICD-10-CM | POA: Diagnosis not present

## 2016-02-12 DIAGNOSIS — Z8673 Personal history of transient ischemic attack (TIA), and cerebral infarction without residual deficits: Secondary | ICD-10-CM | POA: Insufficient documentation

## 2016-02-12 DIAGNOSIS — E039 Hypothyroidism, unspecified: Secondary | ICD-10-CM | POA: Diagnosis not present

## 2016-02-12 DIAGNOSIS — R042 Hemoptysis: Secondary | ICD-10-CM | POA: Diagnosis present

## 2016-02-12 DIAGNOSIS — C3491 Malignant neoplasm of unspecified part of right bronchus or lung: Secondary | ICD-10-CM | POA: Diagnosis not present

## 2016-02-12 DIAGNOSIS — I110 Hypertensive heart disease with heart failure: Secondary | ICD-10-CM | POA: Insufficient documentation

## 2016-02-12 DIAGNOSIS — Z87891 Personal history of nicotine dependence: Secondary | ICD-10-CM | POA: Insufficient documentation

## 2016-02-12 DIAGNOSIS — R918 Other nonspecific abnormal finding of lung field: Secondary | ICD-10-CM | POA: Insufficient documentation

## 2016-02-12 HISTORY — DX: Malignant neoplasm of unspecified part of unspecified bronchus or lung: C34.90

## 2016-02-12 LAB — CBC WITH DIFFERENTIAL/PLATELET
BASOS PCT: 0 %
Basophils Absolute: 0 10*3/uL (ref 0.0–0.1)
EOS ABS: 0 10*3/uL (ref 0.0–0.7)
EOS PCT: 0 %
HCT: 33.1 % — ABNORMAL LOW (ref 36.0–46.0)
Hemoglobin: 10.6 g/dL — ABNORMAL LOW (ref 12.0–15.0)
LYMPHS ABS: 0.4 10*3/uL — AB (ref 0.7–4.0)
Lymphocytes Relative: 8 %
MCH: 32.3 pg (ref 26.0–34.0)
MCHC: 32 g/dL (ref 30.0–36.0)
MCV: 100.9 fL — ABNORMAL HIGH (ref 78.0–100.0)
MONO ABS: 0.3 10*3/uL (ref 0.1–1.0)
MONOS PCT: 6 %
NEUTROS PCT: 86 %
Neutro Abs: 4 10*3/uL (ref 1.7–7.7)
PLATELETS: 243 10*3/uL (ref 150–400)
RBC: 3.28 MIL/uL — ABNORMAL LOW (ref 3.87–5.11)
RDW: 17.8 % — AB (ref 11.5–15.5)
WBC: 4.7 10*3/uL (ref 4.0–10.5)

## 2016-02-12 LAB — COMPREHENSIVE METABOLIC PANEL
ALT: 23 U/L (ref 14–54)
AST: 16 U/L (ref 15–41)
Albumin: 3.6 g/dL (ref 3.5–5.0)
Alkaline Phosphatase: 40 U/L (ref 38–126)
Anion gap: 6 (ref 5–15)
BILIRUBIN TOTAL: 0.8 mg/dL (ref 0.3–1.2)
BUN: 38 mg/dL — AB (ref 6–20)
CHLORIDE: 96 mmol/L — AB (ref 101–111)
CO2: 33 mmol/L — ABNORMAL HIGH (ref 22–32)
CREATININE: 1.01 mg/dL — AB (ref 0.44–1.00)
Calcium: 10.3 mg/dL (ref 8.9–10.3)
GFR, EST AFRICAN AMERICAN: 56 mL/min — AB (ref >60)
GFR, EST NON AFRICAN AMERICAN: 49 mL/min — AB (ref >60)
Glucose, Bld: 120 mg/dL — ABNORMAL HIGH (ref 65–99)
Potassium: 4.1 mmol/L (ref 3.5–5.1)
Sodium: 135 mmol/L (ref 135–145)
TOTAL PROTEIN: 5.9 g/dL — AB (ref 6.5–8.1)

## 2016-02-12 LAB — TYPE AND SCREEN
ABO/RH(D): O POS
ANTIBODY SCREEN: NEGATIVE

## 2016-02-12 LAB — BRAIN NATRIURETIC PEPTIDE: B NATRIURETIC PEPTIDE 5: 249.4 pg/mL — AB (ref 0.0–100.0)

## 2016-02-12 LAB — TROPONIN I

## 2016-02-12 MED ORDER — HEPARIN SOD (PORK) LOCK FLUSH 100 UNIT/ML IV SOLN
500.0000 [IU] | Freq: Once | INTRAVENOUS | Status: AC
  Administered 2016-02-12: 500 [IU]
  Filled 2016-02-12: qty 5

## 2016-02-12 MED ORDER — IOPAMIDOL (ISOVUE-300) INJECTION 61%
75.0000 mL | Freq: Once | INTRAVENOUS | Status: AC | PRN
  Administered 2016-02-12: 75 mL via INTRAVENOUS

## 2016-02-12 NOTE — Telephone Encounter (Signed)
MD made aware of pt's location currently at [facility] ER.

## 2016-02-12 NOTE — ED Triage Notes (Addendum)
Pt had bronchoscopy 5 days ago. Started having blood in sputum 3 days ago. Saw MD, Diagnosed with bronchitis. Recent diagnosis of lung ca. Symptoms continue today. Pt on 2L [place] at home. Not on chemo. On blood thinners.

## 2016-02-12 NOTE — ED Notes (Signed)
Family request something for pt to eat or drink. [place] MD notified via phone and verbalizes awaiting to hear from pulmonology, not at present time. Pt/family made aware.

## 2016-02-12 NOTE — ED Provider Notes (Signed)
[facility] DEPT Provider Note   CSN: [id number] Arrival date & time: 02/12/16  4481     History   Chief Complaint Chief Complaint  Patient presents with  . Hemoptysis    HPI Deborah [name] is a 82 y.o. female.  HPI  Patient with new diagnosis of pulmonary nodule who underwent bronchoscopy 5 days ago presents to the ED with worsening hemoptysis. Since the procedure patient has had mild streaky brown hemoptysis however she reports that she is beginning to have bright red blood with coughing. She denies any significant shortness of breath or chest pain. No nausea or vomiting. No abdominal pain. Patient does have a history of heart failure and mechanical valve on Lovenox. No other physical complaints at this time.  Past Medical History:  Diagnosis Date  . Acute CHF (congestive heart failure) ([place]) 01/10/2016  . Anemia   . Arthritis    "fingers, toes, hips; qwhere" (05/19/2013)  . Breast cancer ([place]) dx'd 1994   "right; S/P lumpectomy, chemo, XRT"   . Chronic lower GI bleeding   . Complication of anesthesia    "once I'm awakened, I don't sleep til the following night" BP drops very low had to stop putting in [place] -a- cath  . GERD (gastroesophageal reflux disease)   . Gout   . Heart murmur   . High cholesterol   . History of blood transfusion 1996; ~ 2013   "related to valve replacement; GI bleeding"   . History of echocardiogram    Echo 1/17: mild LVH, EF 50-55%, no RWMA, mild AI, mechanical AVR with mean 21 mmHg/peak 42 mmHg (stable since 2012), mod MR, mod to severe BAE, PASP 36 mmHg  . History of nuclear stress test    Myoview 1/17: EF 59%, normal perfusion, low risk  . Hypertension   . Hypothyroidism   . Lung cancer ([place]) dx'd 01/2016   SCL  . On supplemental oxygen therapy   . Permanent atrial fibrillation ([place])       . Pneumonia 07/2011  . Rectus sheath hematoma 12/19/14    held anticoagulation for 1 week, transfused  . Stomach problems    seeing Dr Watt Climes  .  TIA (transient ischemic attack) 12/24/14   symptoms resolved    Patient Active Problem List   Diagnosis Date Noted  . Hemoptysis   . Mediastinal lymphadenopathy   . Lung cancer, primary, with metastasis from lung to other site, right ([place]) 02/06/2016  . Ascending aortic aneurysm ([place]) 01/24/2016  . Bilateral pleural effusion 01/11/2016  . AVM (arteriovenous malformation) 01/11/2016  . Acute respiratory failure with hypoxia ([place]) 01/11/2016  . S/P thoracentesis   . Shortness of breath 01/10/2016  . Acute CHF (congestive heart failure) ([place]) 01/10/2016  . Lung mass   . Chronic GI bleeding 12/27/2014  . Cerebral thrombosis with cerebral infarction ([place]) 12/26/2014  . TIA (transient ischemic attack)   . Dysarthria 12/25/2014  . HLD (hyperlipidemia) 12/25/2014  . Essential hypertension 12/25/2014  . Gout 12/25/2014  . GERD (gastroesophageal reflux disease) 12/25/2014  . Normocytic anemia due to blood loss   . Rectus sheath hematoma 12/19/2014  . Acute blood loss anemia 12/19/2014  . Hematoma 12/19/2014  . Pain 06/14/2014  . Poor venous access   . Melena 05/19/2013  . Coagulopathy ([place]) 01/27/2013  . Iron deficiency anemia due to chronic blood loss 07/24/2012  . Malaise and fatigue 05/21/2012  . Dyslipidemia 12/04/2011  . Chronic atrial fibrillation ([place]) 09/06/2011  . Long term current use of  anticoagulant therapy 08/08/2011  . Nonsustained ventricular tachycardia ([place]) 07/21/2011  . Hypokalemia 07/20/2011  . Hyponatremia 07/20/2011  . Bronchitis 07/20/2011  . S/P aortic valve replacement with metallic valve 00/86/7619  . Hypothyroidism   . Benign hypertensive heart disease without heart failure     Past Surgical History:  Procedure Laterality Date  . ABDOMINAL HYSTERECTOMY    . AORTIC VALVE REPLACEMENT  1996   [employer]  . APPENDECTOMY    . arteriogram  01/12   NO RAS   . BREAST BIOPSY Right   . BREAST LUMPECTOMY Right   . CARDIAC CATHETERIZATION    . CARDIOVERSION      X 2  . CATARACT EXTRACTION W/ INTRAOCULAR LENS  IMPLANT, BILATERAL Bilateral    bilateral cataract with lens implants  . CHOLECYSTECTOMY    . COLONOSCOPY WITH PROPOFOL N/A 11/24/2012   Procedure: COLONOSCOPY WITH PROPOFOL;  Surgeon: Jeryl Columbia, MD;  Location: [facility] ENDOSCOPY;  Service: Endoscopy;  Laterality: N/A;  . ESOPHAGOGASTRODUODENOSCOPY (EGD) WITH PROPOFOL N/A 07/25/2015   Procedure: ESOPHAGOGASTRODUODENOSCOPY (EGD) WITH PROPOFOL;  Surgeon: Clarene Essex, MD;  Location[facility] ENDOSCOPY;  Service: Endoscopy;  Laterality: N/A;  . HOT HEMOSTASIS N/A 07/25/2015   Procedure: HOT HEMOSTASIS (ARGON PLASMA COAGULATION/BICAP);  Surgeon: Clarene Essex, MD;  Location[facility] END[facility]COPY;  Service: Endoscopy;  Laterality: N/A;  . VIDEO BRONCHOSCOPY WITH ENDOBRONCHIAL ULTRASOUND N/A 02/07/2016   Procedure: VIDEO BRONCHOSCOPY WITH ENDOBRONCHIAL ULTRASOUND;  Surgeon: Collene Gobble, MD;  Location: [facility];  Service: Thoracic;  Laterality: N/A;    OB History    No data available       Home Medications    Prior to Admission medications   Medication Sig Start Date End Date Taking? Authorizing Provider  acetaminophen (TYLENOL) 500 MG tablet Take 1,000 mg by mouth every 6 (six) hours as needed for mild pain, moderate pain or headache. Reported on 09/26/2015   Yes Historical Provider, MD  allopurinol (ZYLOPRIM) 300 MG tablet Take 300 mg by mouth at bedtime.    Yes Historical Provider, MD  amLODipine (NORVASC) 5 MG tablet Take 5 mg by mouth at bedtime.   Yes Historical Provider, MD  azithromycin (ZITHROMAX) 250 MG tablet Take 250-500 mg by mouth daily. Take 500 mg now and 250 mg daily x 5 days 02/09/16  Yes Historical Provider, MD  calcium-vitamin D 500 MG tablet Take 1 tablet by mouth 2 (two) times daily. Reported on 09/26/2015   Yes Historical Provider, MD  carvedilol (COREG) 6.25 MG tablet TAKE 6.25 MG BY MOUTH TWICE DAILY 02/07/16  Yes Collene Gobble, MD  celecoxib (CELEBREX) 200 MG capsule Take 200 mg by mouth daily  as needed for moderate pain.  12/07/14  Yes Historical Provider, MD  cloNIDine (CATAPRES) 0.1 MG tablet Take 0.1 mg by mouth daily.    Yes Historical Provider, MD  dexamethasone (DECADRON) 4 MG tablet Take 2 tablets (8 mg total) by mouth 2 (two) times daily with a meal. 02/09/16  Yes [name], MD  dicyclomine (BENTYL) 10 MG capsule Take 10 mg by mouth daily as needed for spasms.    Yes Historical Provider, MD  enoxaparin (LOVENOX) 60 MG/0.6ML injection INJECT 0.67m IN TO SKIN EVERY 12 HOURS 02/05/16  Yes GChauncey Cruel MD  fexofenadine ([facility]) 180 MG tablet Take 180 mg by mouth daily.    Yes Historical Provider, MD  furosemide (LASIX) 40 MG tablet Take 1-2 tablets daily as directed. Patient taking differently: Take 40 mg by mouth 2 (  two) times daily as needed for fluid.  12/28/15  Yes Rhonda G Barrett, PA-C  hydrOXYzine (ATARAX/VISTARIL) 25 MG tablet Take 25 mg by mouth 3 (three) times daily as needed for anxiety.   Yes Historical Provider, MD  levothyroxine (SYNTHROID, LEVOTHROID) 88 MCG tablet Take 1 tablet (88 mcg total) by mouth daily before breakfast. 10/10/15  Yes Skeet Latch, MD  NITROSTAT 0.4 MG SL tablet Place 0.4 mg under the tongue every 5 (five) minutes as needed for chest pain (x 3 doses). Reported on 10/17/2015 09/12/14  Yes Historical Provider, MD  OXYGEN Inhale 2 L into the lungs continuous.   Yes Historical Provider, MD  potassium chloride SA (K-DUR,KLOR-CON) 20 MEQ tablet Take 0.5 tablets (10 mEq total) by mouth 2 (two) times daily as needed (taking with Lasix). 02/07/16  Yes Collene Gobble, MD  Probiotic Product (PROBIOTIC ADVANCED PO) Take 1 tablet by mouth daily. Reported on 10/17/2015   Yes Historical Provider, MD  simvastatin (ZOCOR) 20 MG tablet Take 10 mg by mouth daily.    Yes Historical Provider, MD  valsartan (DIOVAN) 320 MG tablet Take 1 tablet (320 mg total) by mouth daily. 09/07/15  Yes Skeet Latch, MD  zolpidem (AMBIEN) 5 MG tablet Take 5 mg by  mouth at bedtime.    Yes Historical Provider, MD  promethazine-codeine (PHENERGAN WITH CODEINE) 6.25-10 MG/5ML syrup Take 5 mLs by mouth every 8 (eight) hours as needed for cough. Patient not taking: Reported on 02/12/2016 02/09/16   [name], MD    Family History Family History  Problem Relation Age of Onset  . Dementia Mother   . Coronary artery disease Father   . Heart attack Father   . Breast cancer Daughter   . Colon cancer Sister   . Liver cancer Sister   . Lung cancer Brother   . COPD Brother     Social History Social History  Substance Use Topics  . Smoking status: Former Smoker    Packs/day: 1.00    Years: 20.00    Types: Cigarettes    Quit date: 04/01/1978  . Smokeless tobacco: Never Used  . Alcohol use 4.8 oz/week    4 Glasses of wine, 4 Shots of liquor per week     Comment: burbon or glass wine daily     Allergies   Alendronate sodium; Amiodarone; Chlorthalidone; Macrodantin [nitrofurantoin]; Meclizine; Norvasc [amlodipine besylate]; Hydralazine hcl; Sulfa antibiotics; and Tussionex pennkinetic er [hydrocod polst-cpm polst er]   Review of Systems Review of Systems Ten systems are reviewed and are negative for acute change except as noted in the HPI   Physical Exam Updated Vital Signs BP 120/78 (BP Location: Right Arm)   Pulse 84   Temp 97.6 F (36.4 C) (Oral)   Resp 20   SpO2 98%   Physical Exam  Constitutional: She is oriented to person, place, and time. She appears well-developed and well-nourished. No distress.  HENT:  Head: Normocephalic and atraumatic.  Nose: Nose normal.  Eyes: Conjunctivae and EOM are normal. Pupils are equal, round, and reactive to light. Right eye exhibits no discharge. Left eye exhibits no discharge. No scleral icterus.  Neck: Normal range of motion. Neck supple.  Cardiovascular: Normal rate and regular rhythm.  Exam reveals no gallop and no friction rub.   No murmur heard. Pulmonary/Chest: Effort normal and  breath sounds normal. No stridor. No respiratory distress. She has no rales.  Abdominal: Soft. She exhibits no distension. There is no tenderness.  Musculoskeletal: She exhibits  no edema or tenderness.  Neurological: She is alert and oriented to person, place, and time.  Skin: Skin is warm and dry. No rash noted. She is not diaphoretic. No erythema.  Psychiatric: She has a normal mood and affect.  Vitals reviewed.    ED Treatments / Results  Labs (all labs ordered are listed, but only abnormal results are displayed) Labs Reviewed  COMPREHENSIVE METABOLIC PANEL - Abnormal; Notable for the following:       Result Value   Chloride 96 (*)    CO2 33 (*)    Glucose, Bld 120 (*)    BUN 38 (*)    Creatinine, Ser 1.01 (*)    Total Protein 5.9 (*)    GFR calc non Af Amer 49 (*)    GFR calc Af Amer 56 (*)    All other components within normal limits  CBC WITH DIFFERENTIAL/PLATELET - Abnormal; Notable for the following:    RBC 3.28 (*)    Hemoglobin 10.6 (*)    HCT 33.1 (*)    MCV 100.9 (*)    RDW 17.8 (*)    Lymphs Abs 0.4 (*)    All other components within normal limits  BRAIN NATRIURETIC PEPTIDE - Abnormal; Notable for the following:    B Natriuretic Peptide 249.4 (*)    All other components within normal limits  TROPONIN I  TYPE AND SCREEN    EKG  EKG Interpretation None       Radiology Dg Chest 2 View  Result Date: 02/12/2016 CLINICAL DATA:  Hemoptysis.  Shortness of breath. EXAM: CHEST  2 VIEW COMPARISON:  01/11/2016 FINDINGS: Left Port-A-Cath is in place, unchanged with the tip in the persistent left-sided SVC. Prior median sternotomy. There is cardiomegaly. Increasing right perihilar and right basilar airspace disease. No confluent opacity on the left. Small right pleural effusion. IMPRESSION: Increasing right perihilar and right lower lobe airspace opacities concerning for pneumonia. Followup PA and lateral chest X-ray is recommended in 3-4 weeks following trial of  antibiotic therapy to ensure resolution and exclude underlying malignancy. Small right pleural effusion. Electronically Signed   By: Rolm Baptise M.D.   On: 02/12/2016 09:51   Ct Head W Wo Contrast  Result Date: 02/12/2016 CLINICAL DATA:  Metastatic lung cancer.  Staging. EXAM: CT HEAD WITHOUT AND WITH CONTRAST TECHNIQUE: Contiguous axial images were obtained from the base of the skull through the vertex without and with intravenous contrast CONTRAST:  75m ISOVUE-300 IOPAMIDOL (ISOVUE-300) INJECTION 61% COMPARISON:  CT head 12/26/2014 FINDINGS: Brain: Mild atrophy. Ventricle size within normal limits. Negative for acute infarct. Negative for hemorrhage or mass. Postcontrast imaging demonstrates normal enhancement. No enhancing mass lesion identified. Vascular: Normal arterial and venous enhancement. Skull: Negative Sinuses/Orbits: Mucosal thickening and opacification of the right sphenoid sinus. No orbital lesion. Other: None IMPRESSION: Negative for metastatic disease to the brain Right sphenoid sinus chronic opacification. Electronically Signed   By: CFranchot GalloM.D.   On: 02/12/2016 11:45   Ct Chest W Contrast  Result Date: 02/12/2016 CLINICAL DATA:  New diagnosis small cell lung cancer.  Hemoptysis. EXAM: CT CHEST WITH CONTRAST TECHNIQUE: Multidetector CT imaging of the chest was performed during intravenous contrast administration. CONTRAST:  775mISOVUE-300 IOPAMIDOL (ISOVUE-300) INJECTION 61% COMPARISON:  01/10/2016 FINDINGS: Cardiovascular: Mild aneurysmal dilatation of the ascending thoracic aorta, 4.4 cm, stable since prior study. Mild cardiomegaly. Diffuse aortic calcifications. No dissection. There is a persistent left SVC noted. Port-A-Cath is in place with the tip in the persistent  left SVC. Retroesophageal right subclavian artery again noted. Mediastinum/Nodes: Necrotic appearing right paratracheal lymph nodes, enlarged since prior study. Index node on image 50 measures 2.7 x 2.9 cm  compared to 2.5 x 2.7 cm previously. Right peritracheal adenopathy on image 41 measures 3.1 x 2.5 cm compared to 2.7 x 2.0 cm previously. Lungs/Pleura: Large right hilar soft tissue mass noted, enlarging since prior study, measuring 5.4 x 3.8 cm compared with 4.2 x 3.6 cm previously. This mass is partially necrotic. Moderate right pleural effusion is similar to prior study. No left effusion. Airspace disease noted in the right upper lobe likely reflects postobstructive pneumonitis. Compressive atelectasis noted in the right lower lobe. Upper Abdomen: 16 mm nodule in the left adrenal gland appearance likely new since prior CT abdomen 08/05/2015. Multiple renal and hepatic cysts are noted. Musculoskeletal: No visible focal bone lesion or acute bony abnormality. IMPRESSION: Enlarging right hilar mass with ground-glass opacities throughout the right upper lobe, likely postobstructive pneumonitis. Enlarging necrotic appearing mediastinal adenopathy. Stable moderate right pleural effusion. Cardiomegaly. Stable mild aneurysmal dilatation of the ascending thoracic aorta. Apparent new small nodule in the left adrenal gland. Cannot exclude metastasis. Electronically Signed   By: Rolm Baptise M.D.   On: 02/12/2016 11:48    Procedures Procedures (including critical care time)  Medications Ordered in ED Medications  iopamidol (ISOVUE-300) 61 % injection 75 mL (75 mLs Intravenous Contrast Given 02/12/16 1111)  heparin lock flush 100 unit/mL (500 Units Intracatheter Given 02/12/16 1502)     Initial Impression / Assessment and Plan / ED Course  I have reviewed the triage vital signs and the nursing notes.  Pertinent labs & imaging results that were available during my care of the patient were reviewed by me and considered in my medical decision making (see chart for details).  Clinical Course     CT with postobstructive pneumonitis. No evidence of active extract. Discussed case with pulmonology who evaluated  the patient in the emergency department and felt she was appropriate for discharge with close follow-up. Hemoglobin stable. Other labs reassuring. Low suspicion for pneumonia. Low suspicion for pulmonary embolism. Patient has been satting well on her home oxygen.  Safe for discharge with strict return precautions.  Final Clinical Impressions(s) / ED Diagnoses   Final diagnoses:  Hemoptysis  Lung mass   Disposition: Discharge  Condition: Good  I have discussed the results, Dx and Tx plan with the patient who expressed understanding and agree(s) with the plan. Discharge instructions discussed at great length. The patient was given strict return precautions who verbalized understanding of the instructions. No further questions at time of discharge.    Discharge Medication List as of 02/12/2016  3:34 PM      Follow Up: Lajean Manes, MD [address] [place] [address] [phone]  Schedule an appointment as soon as possible for a visit  As needed  Collene Gobble, MD [address] [place] [address] [place], [place] [address] [phone]  Schedule an appointment as soon as possible for a visit        Fatima Blank, MD 02/12/16 1700

## 2016-02-12 NOTE — Telephone Encounter (Signed)
Oncology Nurse Navigator Documentation  Oncology Nurse Navigator Flowsheets 02/12/2016  Navigator Location [facility]  Navigator Encounter Type Telephone/I updated Dr. Julien Nordmann on an update Dr. Lamonte Sakai gave me.  I called and I spoke with the daughter and gave her an appt to see Dr. Julien Nordmann on 02/16/16 at 10:00.  She verbalized understanding of appt  Telephone Outgoing Call  Treatment Phase Pre-Tx/Tx Discussion  Barriers/Navigation Needs Coordination of Care  Interventions Coordination of Care  Coordination of Care Appts  Acuity Level 2  Acuity Level 2 Assistance expediting appointments  Time Spent with Patient 30

## 2016-02-12 NOTE — Consult Note (Signed)
Name: Deborah [name] MRN: [id number] DOB: 1926/12/07    ADMISSION DATE:  02/12/2016 CONSULTATION DATE:  02/12/16  REFERRING MD :  Dr. Leonette [name] / EDP   CHIEF COMPLAINT:  Hemoptysis    HISTORY OF PRESENT ILLNESS:  82 y.o.woman, former smoker (~20 pk-yrs), with a PMH remote right sided breast CA s/p lumpectomy and chemo / radiation in 1994 (had been cancer free since). She also has a history of hypertension, hyperlipidemia, chronic atrial fibrillation and a [employer] mechanical aortic valve replacement on anticoagulation, recently changed to enoxaparintwice a day. Has seen Dr. Jana [name] for anemia presumed due to chronic GI blood loss in the setting of AVMs and gets PRN blood transfusions.  On 10/11, she was brought to [facility] ED with SOB. Had CTA that demonstrated right sided lung mass. There was an associated right effusion that was tapped. Cytology did not reveal any evidence of malignancy.  CT scan 10/11 was negative for PE but showed a right hilar mass that surrounds and narrows the right main pulmonary artery, narrows and impacts the anterior segment bronchus of the right upper lobe. The bronchusis narrowed.  Also noted is an enlarged 4R lymph node adjacent to the hilar mass.  On 11/8, she underwent an EBUS per Dr. Lamonte [name] which showed hyperemia of the R sided mucosa, apical segment of the RUL was narrowed with a suspected endobronchial lesion obstructing the apical segment.  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 She was discharged home post procedure.  Deborah [name] followed up with Dr. Jana [name] on 11/10 and reported she felt her chest was tighter, more short of breath and "murky" sputum.  She was treated with azithromycin for 4 days and a prednisone taper.  The patient reports she has had intermittent light pink tinged sputum but never completely cleared.    On 11/13, she experienced a small amount of hemoptysis (one tissue with small amt bright red blood).   She reported to the ER for evaluation.  She was planned for a surveillance CT of the head for metastatic disease.  While in the ER, this was completed which was negative for metastatic disease.  CT of the chest was also completed which showed recummulation of right pleural effusion and slight enlargement of right mass.  She denies fevers, chills, sputum production, n/v/d. Reports mild chest discomfort with deep inspiration and SOB with exertion when not on oxygen.    PCCM consulted for evaluation.      PAST MEDICAL HISTORY :   has a past medical history of Acute CHF (congestive heart failure) ([place]) (01/10/2016); Anemia; Arthritis; Breast cancer ([place]) (dx'd 1994); Chronic lower GI bleeding; Complication of anesthesia; GERD (gastroesophageal reflux disease); Gout; Heart murmur; High cholesterol; History of blood transfusion (1996; ~ 2013); History of echocardiogram; History of nuclear stress test; Hypertension; Hypothyroidism; Lung cancer ([place]) (dx'd 01/2016); On supplemental oxygen therapy; Permanent atrial fibrillation ([place]); Pneumonia (07/2011); Rectus sheath hematoma (12/19/14 ); Stomach problems; and TIA (transient ischemic attack) (12/24/14).   has a past surgical history that includes arteriogram (01/12); Cardioversion; Cardiac catheterization; Cataract extraction w/ intraocular lens  implant, bilateral (Bilateral); Cholecystectomy; Abdominal hysterectomy; Appendectomy; Colonoscopy with propofol (N/A, 11/24/2012); Breast biopsy (Right); Breast lumpectomy (Right); Aortic valve replacement (1996); Esophagogastroduodenoscopy (egd) with propofol (N/A, 07/25/2015); Hot hemostasis (N/A, 07/25/2015); and Video bronchoscopy with endobronchial ultrasound (N/A, 02/07/2016).  Prior to Admission medications   Medication Sig Start Date End Date Taking? Authorizing Provider  acetaminophen (TYLENOL) 500 MG tablet Take 1,000 mg by  mouth every 6 (six) hours as needed for mild pain, moderate pain or headache. Reported on  09/26/2015   Yes Historical Provider, MD  allopurinol (ZYLOPRIM) 300 MG tablet Take 300 mg by mouth at bedtime.    Yes Historical Provider, MD  amLODipine (NORVASC) 5 MG tablet Take 5 mg by mouth at bedtime.   Yes Historical Provider, MD  azithromycin (ZITHROMAX) 250 MG tablet Take 250-500 mg by mouth daily. Take 500 mg now and 250 mg daily x 5 days 02/09/16  Yes Historical Provider, MD  calcium-vitamin D 500 MG tablet Take 1 tablet by mouth 2 (two) times daily. Reported on 09/26/2015   Yes Historical Provider, MD  carvedilol (COREG) 6.25 MG tablet TAKE 6.25 MG BY MOUTH TWICE DAILY 02/07/16  Yes Collene Gobble, MD  celecoxib (CELEBREX) 200 MG capsule Take 200 mg by mouth daily as needed for moderate pain.  12/07/14  Yes Historical Provider, MD  cloNIDine (CATAPRES) 0.1 MG tablet Take 0.1 mg by mouth daily.    Yes Historical Provider, MD  dexamethasone (DECADRON) 4 MG tablet Take 2 tablets (8 mg total) by mouth 2 (two) times daily with a meal. 02/09/16  Yes [name], MD  dicyclomine (BENTYL) 10 MG capsule Take 10 mg by mouth daily as needed for spasms.    Yes Historical Provider, MD  enoxaparin (LOVENOX) 60 MG/0.6ML injection INJECT 0.31m IN TO SKIN EVERY 12 HOURS 02/05/16  Yes GChauncey Cruel MD  fexofenadine ([facility]) 180 MG tablet Take 180 mg by mouth daily.    Yes Historical Provider, MD  furosemide (LASIX) 40 MG tablet Take 1-2 tablets daily as directed. Patient taking differently: Take 40 mg by mouth 2 (two) times daily as needed for fluid.  12/28/15  Yes Rhonda G Barrett, PA-C  hydrOXYzine (ATARAX/VISTARIL) 25 MG tablet Take 25 mg by mouth 3 (three) times daily as needed for anxiety.   Yes Historical Provider, MD  levothyroxine (SYNTHROID, LEVOTHROID) 88 MCG tablet Take 1 tablet (88 mcg total) by mouth daily before breakfast. 10/10/15  Yes TSkeet Latch MD  NITROSTAT 0.4 MG SL tablet Place 0.4 mg under the tongue every 5 (five) minutes as needed for chest pain (x 3 doses).  Reported on 10/17/2015 09/12/14  Yes Historical Provider, MD  OXYGEN Inhale 2 L into the lungs continuous.   Yes Historical Provider, MD  potassium chloride SA (K-DUR,KLOR-CON) 20 MEQ tablet Take 0.5 tablets (10 mEq total) by mouth 2 (two) times daily as needed (taking with Lasix). 02/07/16  Yes RCollene Gobble MD  Probiotic Product (PROBIOTIC ADVANCED PO) Take 1 tablet by mouth daily. Reported on 10/17/2015   Yes Historical Provider, MD  simvastatin (ZOCOR) 20 MG tablet Take 10 mg by mouth daily.    Yes Historical Provider, MD  valsartan (DIOVAN) 320 MG tablet Take 1 tablet (320 mg total) by mouth daily. 09/07/15  Yes TSkeet Latch MD  zolpidem (AMBIEN) 5 MG tablet Take 5 mg by mouth at bedtime.    Yes Historical Provider, MD  promethazine-codeine (PHENERGAN WITH CODEINE) 6.25-10 MG/5ML syrup Take 5 mLs by mouth every 8 (eight) hours as needed for cough. Patient not taking: Reported on 02/12/2016 02/09/16   GChauncey Cruel MD    Allergies  Allergen Reactions  . Alendronate Sodium Other (See Comments)    GI PAIN CAUTION: RISK FOR PERFORATION  . Amiodarone Shortness Of Breath  . Chlorthalidone Shortness Of Breath    ABDOMINAL BLOATING  . Macrodantin [Nitrofurantoin] Nausea And Vomiting and  Other (See Comments)    CONTRAINDICATED WITH GFR < 50  . Meclizine Swelling    SWELLING OF TONGUE  . Norvasc [Amlodipine Besylate] Swelling    SWELLING REACTION UNSPECIFIED   . Hydralazine Hcl Nausea Only  . Sulfa Antibiotics Other (See Comments)    Unknown  . Tussionex Pennkinetic Er [Hydrocod Polst-Cpm Polst Er] Other (See Comments)    UNSPECIFIED REACTION     FAMILY HISTORY:  family history includes Breast cancer in her daughter; COPD in her brother; Colon cancer in her sister; Coronary artery disease in her father; Dementia in her mother; Heart attack in her father; Liver cancer in her sister; Lung cancer in her brother.  SOCIAL HISTORY:  reports that she quit smoking about 37 years ago. Her  smoking use included Cigarettes. She has a 20.00 pack-year smoking history. She has never used smokeless tobacco. She reports that she drinks about 4.8 oz of alcohol per week . She reports that she does not use drugs.  REVIEW OF SYSTEMS:  POSITIVES IN BOLD Constitutional: Negative for fever, chills, weight loss, malaise/fatigue and diaphoresis.  HENT: Negative for hearing loss, ear pain, nosebleeds, congestion, sore throat, neck pain, tinnitus and ear discharge.   Eyes: Negative for blurred vision, double vision, photophobia, pain, discharge and redness.  Respiratory: Negative for cough, hemoptysis, sputum production, shortness of breath, wheezing and stridor.   Cardiovascular: Negative for chest pain, palpitations, orthopnea, claudication, leg swelling and PND.  Gastrointestinal: Negative for heartburn, nausea, vomiting, abdominal pain, diarrhea, constipation, blood in stool and melena.  Genitourinary: Negative for dysuria, urgency, frequency, hematuria and flank pain.  Musculoskeletal: Negative for myalgias, back pain, joint pain and falls.  Skin: Negative for itching and rash.  Neurological: Negative for dizziness, tingling, tremors, sensory change, speech change, focal weakness, seizures, loss of consciousness, weakness and headaches.  Endo/Heme/Allergies: Negative for environmental allergies and polydipsia. Does not bruise/bleed easily.  SUBJECTIVE:   VITAL SIGNS: Temp:  [97.6 F (36.4 C)] 97.6 F (36.4 C) (11/13 0851) Pulse Rate:  [74-103] 103 (11/13 1306) Resp:  [15-18] 18 (11/13 1306) BP: (141-160)/(86-113) 160/86 (11/13 1306) SpO2:  [98 %-100 %] 98 % (11/13 1306)  PHYSICAL EXAMINATION: General:  Thin adult female in NAD  Neuro:  AAOx4, speech clear, MAE  HEENT:  MM pink/moist, no jvd, tolerating PO's Cardiovascular:  s1s2 rrr, no m/r/g  Lungs:  Even/non-labored, mild dyspnea with exertion, bronchial breath sounds on R Abdomen:  Soft, non-tender, bsx4 active  Musculoskeletal:   No acute deformities  Skin:  Warm/dry, no edema    Recent Labs Lab 02/05/16 1535 02/12/16 0927  NA 132* 135  K 4.5 4.1  CL 96* 96*  CO2 28 33*  BUN 41* 38*  CREATININE 1.16* 1.01*  GLUCOSE 113* 120*     Recent Labs Lab 02/07/16 0907 02/08/16 0945 02/12/16 0927  HGB 9.5* 8.5* 10.6*  HCT 29.6* 26.5* 33.1*  WBC 3.4* 4.0 4.7  PLT 166 154 243    Dg Chest 2 View  Result Date: 02/12/2016 CLINICAL DATA:  Hemoptysis.  Shortness of breath. EXAM: CHEST  2 VIEW COMPARISON:  01/11/2016 FINDINGS: Left Port-A-Cath is in place, unchanged with the tip in the persistent left-sided SVC. Prior median sternotomy. There is cardiomegaly. Increasing right perihilar and right basilar airspace disease. No confluent opacity on the left. Small right pleural effusion. IMPRESSION: Increasing right perihilar and right lower lobe airspace opacities concerning for pneumonia. Followup PA and lateral chest X-ray is recommended in 3-4 weeks following trial of antibiotic therapy  to ensure resolution and exclude underlying malignancy. Small right pleural effusion. Electronically Signed   By: Rolm Baptise M.D.   On: 02/12/2016 09:51   Ct Head W Wo Contrast  Result Date: 02/12/2016 CLINICAL DATA:  Metastatic lung cancer.  Staging. EXAM: CT HEAD WITHOUT AND WITH CONTRAST TECHNIQUE: Contiguous axial images were obtained from the base of the skull through the vertex without and with intravenous contrast CONTRAST:  52m ISOVUE-300 IOPAMIDOL (ISOVUE-300) INJECTION 61% COMPARISON:  CT head 12/26/2014 FINDINGS: Brain: Mild atrophy. Ventricle size within normal limits. Negative for acute infarct. Negative for hemorrhage or mass. Postcontrast imaging demonstrates normal enhancement. No enhancing mass lesion identified. Vascular: Normal arterial and venous enhancement. Skull: Negative Sinuses/Orbits: Mucosal thickening and opacification of the right sphenoid sinus. No orbital lesion. Other: None IMPRESSION: Negative for  metastatic disease to the brain Right sphenoid sinus chronic opacification. Electronically Signed   By: CFranchot GalloM.D.   On: 02/12/2016 11:45   Ct Chest W Contrast  Result Date: 02/12/2016 CLINICAL DATA:  New diagnosis small cell lung cancer.  Hemoptysis. EXAM: CT CHEST WITH CONTRAST TECHNIQUE: Multidetector CT imaging of the chest was performed during intravenous contrast administration. CONTRAST:  725mISOVUE-300 IOPAMIDOL (ISOVUE-300) INJECTION 61% COMPARISON:  01/10/2016 FINDINGS: Cardiovascular: Mild aneurysmal dilatation of the ascending thoracic aorta, 4.4 cm, stable since prior study. Mild cardiomegaly. Diffuse aortic calcifications. No dissection. There is a persistent left SVC noted. Port-A-Cath is in place with the tip in the persistent left SVC. Retroesophageal right subclavian artery again noted. Mediastinum/Nodes: Necrotic appearing right paratracheal lymph nodes, enlarged since prior study. Index node on image 50 measures 2.7 x 2.9 cm compared to 2.5 x 2.7 cm previously. Right peritracheal adenopathy on image 41 measures 3.1 x 2.5 cm compared to 2.7 x 2.0 cm previously. Lungs/Pleura: Large right hilar soft tissue mass noted, enlarging since prior study, measuring 5.4 x 3.8 cm compared with 4.2 x 3.6 cm previously. This mass is partially necrotic. Moderate right pleural effusion is similar to prior study. No left effusion. Airspace disease noted in the right upper lobe likely reflects postobstructive pneumonitis. Compressive atelectasis noted in the right lower lobe. Upper Abdomen: 16 mm nodule in the left adrenal gland appearance likely new since prior CT abdomen 08/05/2015. Multiple renal and hepatic cysts are noted. Musculoskeletal: No visible focal bone lesion or acute bony abnormality. IMPRESSION: Enlarging right hilar mass with ground-glass opacities throughout the right upper lobe, likely postobstructive pneumonitis. Enlarging necrotic appearing mediastinal adenopathy. Stable moderate  right pleural effusion. Cardiomegaly. Stable mild aneurysmal dilatation of the ascending thoracic aorta. Apparent new small nodule in the left adrenal gland. Cannot exclude metastasis. Electronically Signed   By: KeRolm Baptise.D.   On: 02/12/2016 11:48     ASSESSMENT / PLAN:  Hemoptysis - in setting of small cell lung cancer diagnosed by recent biopsy on 11/8 Small Cell Lung Cancer   Discussion:  Mrs. GoSiegristas examined in the ER.  Daughter (MStanton Kidneyat bedside.  She is in no distress and has had only one isolated episode today of bright blood (very small amount, was saved on tissue for provider to see).  Clinically, she is in no distress and does not want to stay in the hospital if possible.  We discussed cough suppression options and she does not want to use codeine as it makes her hallucinate.  Patient and daughter are very anxious to see Dr. MoEarlie Servero determine treatment options.  Mrs. GoTorokestated again today that she has "accepted this  and does not want to do anything that makes her feel bad". We also discussed quantity of blood and anticoagulation.  Reviewed when to return to the ER (increased blood, coughing up increased amounts / frank blood).    Plan: Ok to resume lovenox this evening  Cough suppression with Delsym per instructions on label  We have contacted Dr. Earlie Server and they will call her for an appointment this week.   Continue home O2  Call Dr. Lamonte [name] if you have any further increase in bleeding or return to ER immediately for large volume bleeding    Noe Gens, NP-C [facility] Pgr: [phone] or if no answer [phone] 02/12/2016, 2:20 PM   Attending Note:  I have examined patient, reviewed labs, studies and notes. I have discussed the case with B Ollis, and I agree with the data and plans as amended above. 82 yo woman well known to me from outpt setting. She went FOB for bx's of a RUL mass that was found to be SCLCA. She restarted enoxaparin  the next evening and has tolerated with some old blood in her sputum. Then on 11/13 am she saw some brighter blood mixed in w her sputum, prompted her to come to the ED. On my eval she is awake, comfortable. Some bronchial BS on the R, L is clear. She has the tissue with the sputum with her, shows some scant pink phlegm. I have reassured her that this is an expected finding, that it should not change our plans for anticoagulation or her workup for her malignancy. I have asked her to continue her enoxaparin beginning this pm. Will call [facility] to let them know that she is stable, that she can follow up with them to discuss therapy whenever they can arrange.   Baltazar Apo, MD, PhD 02/12/2016, 3:09 PM [facility] [phone] or if no answer [phone]

## 2016-02-12 NOTE — Telephone Encounter (Signed)
Message received from Katharine Look, Midwife at [facility] to inform Dr. Jana Hakim that patient is on her way to [facility] ER d/t she is coughing up bright red blood.  Patient is scheduled for a CT brain this AM at 10:30AM.  Radiology notified that patient is on her way to the ER.  Message left with Val at Dr. Virgie Dad desk regarding patient's condition.

## 2016-02-15 ENCOUNTER — Other Ambulatory Visit: Payer: Self-pay

## 2016-02-15 MED ORDER — NITROSTAT 0.4 MG SL SUBL: 0.4000 mg | SUBLINGUAL_TABLET | SUBLINGUAL | 5 refills | Status: DC | PRN

## 2016-02-16 ENCOUNTER — Encounter: Payer: Self-pay | Admitting: Physician Assistant

## 2016-02-16 ENCOUNTER — Ambulatory Visit
Admission: RE | Admit: 2016-02-16 | Discharge: 2016-02-16 | Disposition: A | Discharge status: Home / Self Care | Payer: Medicare Other | Source: Ambulatory Visit | Attending: Radiation Oncology | Admitting: Radiation Oncology

## 2016-02-16 ENCOUNTER — Encounter: Payer: Self-pay | Admitting: *Deleted

## 2016-02-16 ENCOUNTER — Other Ambulatory Visit: Payer: Self-pay | Admitting: *Deleted

## 2016-02-16 ENCOUNTER — Telehealth: Payer: Self-pay | Admitting: Cardiovascular Disease

## 2016-02-16 ENCOUNTER — Other Ambulatory Visit: Payer: Self-pay

## 2016-02-16 ENCOUNTER — Ambulatory Visit (HOSPITAL_BASED_OUTPATIENT_CLINIC_OR_DEPARTMENT_OTHER): Payer: Medicare Other | Admitting: Internal Medicine

## 2016-02-16 ENCOUNTER — Encounter: Payer: Self-pay | Admitting: Internal Medicine

## 2016-02-16 ENCOUNTER — Telehealth: Payer: Self-pay | Admitting: *Deleted

## 2016-02-16 ENCOUNTER — Telehealth: Payer: Self-pay | Admitting: Internal Medicine

## 2016-02-16 ENCOUNTER — Ambulatory Visit (HOSPITAL_BASED_OUTPATIENT_CLINIC_OR_DEPARTMENT_OTHER): Payer: Medicare Other

## 2016-02-16 ENCOUNTER — Ambulatory Visit (INDEPENDENT_AMBULATORY_CARE_PROVIDER_SITE_OTHER): Payer: Medicare Other | Admitting: Physician Assistant

## 2016-02-16 VITALS — BP 161/110 | HR 90 | Temp 97.9°F | Resp 17 | Ht 60.0 in | Wt 123.5 lb

## 2016-02-16 VITALS — BP 146/92 | HR 91 | Ht 60.0 in | Wt 122.0 lb

## 2016-02-16 DIAGNOSIS — I2510 Atherosclerotic heart disease of native coronary artery without angina pectoris: Secondary | ICD-10-CM

## 2016-02-16 DIAGNOSIS — M549 Dorsalgia, unspecified: Secondary | ICD-10-CM

## 2016-02-16 DIAGNOSIS — I482 Chronic atrial fibrillation, unspecified: Secondary | ICD-10-CM

## 2016-02-16 DIAGNOSIS — I712 Thoracic aortic aneurysm, without rupture: Secondary | ICD-10-CM

## 2016-02-16 DIAGNOSIS — Z952 Presence of prosthetic heart valve: Secondary | ICD-10-CM | POA: Insufficient documentation

## 2016-02-16 DIAGNOSIS — M109 Gout, unspecified: Secondary | ICD-10-CM | POA: Insufficient documentation

## 2016-02-16 DIAGNOSIS — C3491 Malignant neoplasm of unspecified part of right bronchus or lung: Secondary | ICD-10-CM

## 2016-02-16 DIAGNOSIS — E039 Hypothyroidism, unspecified: Secondary | ICD-10-CM

## 2016-02-16 DIAGNOSIS — I10 Essential (primary) hypertension: Secondary | ICD-10-CM

## 2016-02-16 DIAGNOSIS — Z803 Family history of malignant neoplasm of breast: Secondary | ICD-10-CM | POA: Insufficient documentation

## 2016-02-16 DIAGNOSIS — Z800 Family history of malignant neoplasm of digestive organs: Secondary | ICD-10-CM | POA: Insufficient documentation

## 2016-02-16 DIAGNOSIS — Z87891 Personal history of nicotine dependence: Secondary | ICD-10-CM | POA: Insufficient documentation

## 2016-02-16 DIAGNOSIS — Z888 Allergy status to other drugs, medicaments and biological substances status: Secondary | ICD-10-CM | POA: Insufficient documentation

## 2016-02-16 DIAGNOSIS — J9601 Acute respiratory failure with hypoxia: Secondary | ICD-10-CM

## 2016-02-16 DIAGNOSIS — R0789 Other chest pain: Secondary | ICD-10-CM | POA: Diagnosis not present

## 2016-02-16 DIAGNOSIS — I6330 Cerebral infarction due to thrombosis of unspecified cerebral artery: Secondary | ICD-10-CM

## 2016-02-16 DIAGNOSIS — I110 Hypertensive heart disease with heart failure: Secondary | ICD-10-CM | POA: Insufficient documentation

## 2016-02-16 DIAGNOSIS — E7800 Pure hypercholesterolemia, unspecified: Secondary | ICD-10-CM | POA: Insufficient documentation

## 2016-02-16 DIAGNOSIS — C3401 Malignant neoplasm of right main bronchus: Secondary | ICD-10-CM | POA: Insufficient documentation

## 2016-02-16 DIAGNOSIS — C7972 Secondary malignant neoplasm of left adrenal gland: Secondary | ICD-10-CM | POA: Diagnosis not present

## 2016-02-16 DIAGNOSIS — I119 Hypertensive heart disease without heart failure: Secondary | ICD-10-CM

## 2016-02-16 DIAGNOSIS — K219 Gastro-esophageal reflux disease without esophagitis: Secondary | ICD-10-CM | POA: Insufficient documentation

## 2016-02-16 DIAGNOSIS — J189 Pneumonia, unspecified organism: Secondary | ICD-10-CM | POA: Insufficient documentation

## 2016-02-16 DIAGNOSIS — Z8673 Personal history of transient ischemic attack (TIA), and cerebral infarction without residual deficits: Secondary | ICD-10-CM | POA: Insufficient documentation

## 2016-02-16 DIAGNOSIS — R079 Chest pain, unspecified: Secondary | ICD-10-CM | POA: Insufficient documentation

## 2016-02-16 DIAGNOSIS — Z9049 Acquired absence of other specified parts of digestive tract: Secondary | ICD-10-CM | POA: Insufficient documentation

## 2016-02-16 DIAGNOSIS — I7121 Aneurysm of the ascending aorta, without rupture: Secondary | ICD-10-CM

## 2016-02-16 DIAGNOSIS — Z79899 Other long term (current) drug therapy: Secondary | ICD-10-CM | POA: Insufficient documentation

## 2016-02-16 DIAGNOSIS — Z923 Personal history of irradiation: Secondary | ICD-10-CM | POA: Insufficient documentation

## 2016-02-16 DIAGNOSIS — Z801 Family history of malignant neoplasm of trachea, bronchus and lung: Secondary | ICD-10-CM | POA: Insufficient documentation

## 2016-02-16 DIAGNOSIS — Z9889 Other specified postprocedural states: Secondary | ICD-10-CM | POA: Insufficient documentation

## 2016-02-16 DIAGNOSIS — Z853 Personal history of malignant neoplasm of breast: Secondary | ICD-10-CM | POA: Insufficient documentation

## 2016-02-16 DIAGNOSIS — Z90710 Acquired absence of both cervix and uterus: Secondary | ICD-10-CM | POA: Insufficient documentation

## 2016-02-16 DIAGNOSIS — D500 Iron deficiency anemia secondary to blood loss (chronic): Secondary | ICD-10-CM

## 2016-02-16 DIAGNOSIS — E785 Hyperlipidemia, unspecified: Secondary | ICD-10-CM

## 2016-02-16 DIAGNOSIS — R590 Localized enlarged lymph nodes: Secondary | ICD-10-CM | POA: Insufficient documentation

## 2016-02-16 DIAGNOSIS — I4821 Permanent atrial fibrillation: Secondary | ICD-10-CM

## 2016-02-16 DIAGNOSIS — Z8249 Family history of ischemic heart disease and other diseases of the circulatory system: Secondary | ICD-10-CM | POA: Insufficient documentation

## 2016-02-16 DIAGNOSIS — I509 Heart failure, unspecified: Secondary | ICD-10-CM | POA: Insufficient documentation

## 2016-02-16 DIAGNOSIS — Z825 Family history of asthma and other chronic lower respiratory diseases: Secondary | ICD-10-CM | POA: Insufficient documentation

## 2016-02-16 DIAGNOSIS — Z510 Encounter for antineoplastic radiation therapy: Secondary | ICD-10-CM | POA: Insufficient documentation

## 2016-02-16 DIAGNOSIS — R042 Hemoptysis: Secondary | ICD-10-CM | POA: Insufficient documentation

## 2016-02-16 HISTORY — DX: Malignant neoplasm of unspecified part of right bronchus or lung: C34.91

## 2016-02-16 HISTORY — DX: Personal history of malignant neoplasm of breast: Z85.3

## 2016-02-16 LAB — CBC WITH DIFFERENTIAL/PLATELET
BASO%: 0.1 % (ref 0.0–2.0)
BASOS ABS: 0 10*3/uL (ref 0.0–0.1)
EOS ABS: 0 10*3/uL (ref 0.0–0.5)
EOS%: 0.2 % (ref 0.0–7.0)
HEMATOCRIT: 38.8 % (ref 34.8–46.6)
HGB: 12.5 g/dL (ref 11.6–15.9)
LYMPH#: 0.7 10*3/uL — AB (ref 0.9–3.3)
LYMPH%: 10.2 % — AB (ref 14.0–49.7)
MCH: 32.4 pg (ref 25.1–34.0)
MCHC: 32.1 g/dL (ref 31.5–36.0)
MCV: 100.9 fL (ref 79.5–101.0)
MONO#: 0.5 10*3/uL (ref 0.1–0.9)
MONO%: 7.3 % (ref 0.0–14.0)
NEUT#: 5.3 10*3/uL (ref 1.5–6.5)
NEUT%: 82.2 % — AB (ref 38.4–76.8)
PLATELETS: 268 10*3/uL (ref 145–400)
RBC: 3.85 10*6/uL (ref 3.70–5.45)
RDW: 18.8 % — ABNORMAL HIGH (ref 11.2–14.5)
WBC: 6.4 10*3/uL (ref 3.9–10.3)

## 2016-02-16 LAB — COMPREHENSIVE METABOLIC PANEL
ALT: 26 U/L (ref 0–55)
ANION GAP: 8 meq/L (ref 3–11)
AST: 20 U/L (ref 5–34)
Albumin: 3.6 g/dL (ref 3.5–5.0)
Alkaline Phosphatase: 55 U/L (ref 40–150)
BUN: 29.4 mg/dL — ABNORMAL HIGH (ref 7.0–26.0)
CALCIUM: 10.6 mg/dL — AB (ref 8.4–10.4)
CHLORIDE: 98 meq/L (ref 98–109)
CO2: 31 meq/L — AB (ref 22–29)
CREATININE: 0.8 mg/dL (ref 0.6–1.1)
EGFR: 65 mL/min/{1.73_m2} — AB (ref >90)
Glucose: 86 mg/dl (ref 70–140)
POTASSIUM: 4.6 meq/L (ref 3.5–5.1)
Sodium: 137 mEq/L (ref 136–145)
Total Bilirubin: 0.66 mg/dL (ref 0.20–1.20)
Total Protein: 6.4 g/dL (ref 6.4–8.3)

## 2016-02-16 LAB — DRAW EXTRA CLOT TUBE

## 2016-02-16 MED ORDER — LORAZEPAM 0.5 MG PO TABS: 0.5000 mg | ORAL_TABLET | Freq: Three times a day (TID) | ORAL | 0 refills | Status: AC | PRN

## 2016-02-16 MED ORDER — TRAMADOL HCL 50 MG PO TABS: 50.0000 mg | ORAL_TABLET | Freq: Four times a day (QID) | ORAL | 0 refills | Status: DC | PRN

## 2016-02-16 NOTE — Telephone Encounter (Signed)
Spoke with Deborah [name] pt had had chest pain twice while there and she is faxing over the ECG   Made appt with [facility] '@230pm'$ 

## 2016-02-16 NOTE — Patient Outreach (Addendum)
Transition of care call. Mrs. Mignogna reports to me that her cancer is very fast growing. She talked with Dr. Mckinley Jewel and he gave her options for treatment. She is going to start radiation therapy on Monday on a daily basis. She is hopeful this will improve her breathing as she is starting to have some SOB. She does tell me she is interested in quality of life versus quantity.  Today, is my last scheduled call, however, I advised her I would like to continue calling her to support her throughout her treatment. She tells me she does not feel this is necessary. She says she has plenty of family support. I did tell her that I would be available to her for support and she can call me.  [facility] CM Care Plan Problem One   Flowsheet Row Most Recent Value  Care Plan Problem One  New Diagnosis of a lung lesion that is under investigation  Role Documenting the Problem One  Care Management [place] for Problem One  Active  [facility] Long Term Goal (31-90 days)  Pt will not readmit to the hospital in the next 31 days.  [facility] Long Term Goal Start Date  01/12/16  [facility] Long Term Goal Met Date  02/16/16  [facility] CM Short Term Goal #2 (0-30 days)  Pt will participate in weekly transition of care calls over the next 30 days.  [facility] CM Short Term Goal #2 Start Date  01/12/16  [facility] CM Short Term Goal #2 Met Date  02/16/16     Deloria Lair [facility] [phone]

## 2016-02-16 NOTE — Telephone Encounter (Signed)
Called patient to confirm follow up appointment with Dr Julien Nordmann for 12//18/17, per 02/16/16 los. Appointments scheduled, per 02/16/16 los. Left voice mail with appointment information.

## 2016-02-16 NOTE — Progress Notes (Signed)
Cardiology Office Note    Date:  02/16/2016   ID:  Deborah [name], DOB January 19, 1927, MRN [id number]  PCP:  Mathews Argyle, MD  Cardiologist:  Dr. Oval Linsey   Chief Complaint  Patient presents with  . Follow-up    seen with Dr. Oval Linsey, add on for chest pain    History of Present Illness:  Deborah [name] is a 82 y.o. female with PMH of History of breast cancer s/p right lumpectomy/chemotherapy and radiation therapy, GERD, hyperlipidemia, hypertension, hypothyroidism, history of TIA, NSVT, permanent atrial fibrillation, and history of mechanical aortic valve replacement. She was previously a patient of Dr. Mare Ferrari. She did develop a retroperitoneal hematoma on Coumadin in September 2006 and was temporarily switched to Lovenox. However since then, she has been transitioned back to Coumadin. She underwent an upper EGD on 07/25/2015 which showed a small hiatal hernia. 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 Her Coumadin is managed by her oncologist. She continued to follow-up with hematology/oncology regarding her anemia and the has frequent infusion of IV iron therapy. She underwent a Lexiscan Myoview in January 2017 which was negative for ischemia. Her echocardiogram during the same month showed mild to moderate aortic stenosis across her mechanical aortic valve with mean gradient of 21 mmHg. Last echocardiogram obtained on 12/12/2015 showed EF 55-60%, mechanical aortic prosthesis present with trivial regurgitation, mild-to-moderate MR, moderate TR, severely dilated left atrium, PA peak pressure 42 mmHg.  She was seen August for increased swelling and shortness of breath. Her BNP at that time was 331. She was started on Lasix and the HCTZ was discontinued. Her edema improved however she developed worsening episodes of hypotension. She was started on clonidine which did help with her blood pressure. She since then, she  has continued to struggle with volume overload and acute renal failure from diuresis. He also noticed more shortness of breath when she have blood transfusion iron transfusion. She was seen in the emergency room in October after getting 2 units of blood. A CT scan looking for PE also revealed a large right hilar soft tissue mass with invasion into the mediastinum. The mass encased and the narrowed distal right main pulmonary artery and branch vessels to the right upper lobe. There were enlarged mediastinal lymph nodes also noted. She also had small pleural effusion and emphysematous changes. Her ascending aortic aneurysm was measuring 4.7 cm in diameter. Endobronchial biopsy was felt to be risky given cardiopulmonary status. PET scan obtained on 01/25/2016 showed hypermetabolic central right upper lobe 4.1 cm lung mass which is controlled with the hyper metabolic right hilar and right paratracheal adenopathy favoring stage IV primary bronchogenic carcinoma. She underwent flexible video fiberoptic cause be with in the bronchial ultrasound and biopsy by Dr. 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 She was seen in the emergency room again on 02/12/2016 for hemoptysis, she was stable at the time, sources for hemoptysis may be related to the recent biopsy. She was resumed on her Lovenox that evening.  She is still on Lovenox as this time, she was at her oncologist office today when she had 4-5 episodes of sharp left-sided stabbing pain radiating to the shoulder blade lasting up to 20 seconds each. She had mild dizziness, but no significant shortness of breath out of her baseline. She is on 3 L nasal cannula at rest. She has no chest discomfort prior to today. EKG is abnormal at  baseline, however has not show any significant change when compared to the previous EKG. Some of her symptom is reproducible as well on palpation.   Past Medical History:  Diagnosis Date  . Acute CHF  (congestive heart failure) ([place]) 01/10/2016  . Anemia   . Arthritis    "fingers, toes, hips; qwhere" (05/19/2013)  . Breast cancer ([place]) dx'd 1994   "right; S/P lumpectomy, chemo, XRT"   . Chronic lower GI bleeding   . Complication of anesthesia    "once I'm awakened, I don't sleep til the following night" BP drops very low had to stop putting in [place] -a- cath  . GERD (gastroesophageal reflux disease)   . Gout   . Heart murmur   . High cholesterol   . History of blood transfusion 1996; ~ 2013   "related to valve replacement; GI bleeding"   . History of echocardiogram    Echo 1/17: mild LVH, EF 50-55%, no RWMA, mild AI, mechanical AVR with mean 21 mmHg/peak 42 mmHg (stable since 2012), mod MR, mod to severe BAE, PASP 36 mmHg  . History of nuclear stress test    Myoview 1/17: EF 59%, normal perfusion, low risk  . Hypertension   . Hypothyroidism   . Lung cancer ([place]) dx'd 01/2016   SCL  . On supplemental oxygen therapy   . Permanent atrial fibrillation ([place])       . Pneumonia 07/2011  . Rectus sheath hematoma 12/19/14    held anticoagulation for 1 week, transfused  . Stomach problems    seeing Dr Watt Climes  . TIA (transient ischemic attack) 12/24/14   symptoms resolved    Past Surgical History:  Procedure Laterality Date  . ABDOMINAL HYSTERECTOMY    . AORTIC VALVE REPLACEMENT  1996   [employer]  . APPENDECTOMY    . arteriogram  01/12   NO RAS   . BREAST BIOPSY Right   . BREAST LUMPECTOMY Right   . CARDIAC CATHETERIZATION    . CARDIOVERSION     X 2  . CATARACT EXTRACTION W/ INTRAOCULAR LENS  IMPLANT, BILATERAL Bilateral    bilateral cataract with lens implants  . CHOLECYSTECTOMY    . COLONOSCOPY WITH PROPOFOL N/A 11/24/2012   Procedure: COLONOSCOPY WITH PROPOFOL;  Surgeon: Jeryl Columbia, MD;  Location: [facility] ENDOSCOPY;  Service: Endoscopy;  Laterality: N/A;  . ESOPHAGOGASTRODUODENOSCOPY (EGD) WITH PROPOFOL N/A 07/25/2015   Procedure: ESOPHAGOGASTRODUODENOSCOPY (EGD) WITH PROPOFOL;   Surgeon: Clarene Essex, MD;  Location: [facility] ENDOSCOPY;  Service: Endoscopy;  Laterality: N/A;  . HOT HEMOSTASIS N/A 07/25/2015   Procedure: HOT HEMOSTASIS (ARGON PLASMA COAGULATION/BICAP);  Surgeon: Clarene Essex, MD;  Location: [facility] ENDOSCOPY;  Service: Endoscopy;  Laterality: N/A;  . VIDEO BRONCHOSCOPY WITH ENDOBRONCHIAL ULTRASOUND N/A 02/07/2016   Procedure: VIDEO BRONCHOSCOPY WITH ENDOBRONCHIAL ULTRASOUND;  Surgeon: Collene Gobble, MD;  Location: [facility] OR;  Service: Thoracic;  Laterality: N/A;    Current Medications: Outpatient Medications Prior to Visit  Medication Sig Dispense Refill  . acetaminophen (TYLENOL) 500 MG tablet Take 1,000 mg by mouth every 6 (six) hours as needed for mild pain, moderate pain or headache. Reported on 09/26/2015    . allopurinol (ZYLOPRIM) 300 MG tablet Take 300 mg by mouth at bedtime.     Marland Kitchen amLODipine (NORVASC) 5 MG tablet Take 5 mg by mouth at bedtime.    Marland Kitchen azithromycin (ZITHROMAX) 250 MG tablet Take 250-500 mg by mouth daily. Take 500 mg now and 250 mg daily x 5 days  0  .  calcium-vitamin D 500 MG tablet Take 1 tablet by mouth 2 (two) times daily. Reported on 09/26/2015    . carvedilol (COREG) 6.25 MG tablet TAKE 6.25 MG BY MOUTH TWICE DAILY 60 tablet 1  . celecoxib (CELEBREX) 200 MG capsule Take 200 mg by mouth daily as needed for moderate pain.   4  . cloNIDine (CATAPRES) 0.1 MG tablet Take 0.1 mg by mouth daily.     Marland Kitchen dexamethasone (DECADRON) 4 MG tablet Take 2 tablets (8 mg total) by mouth 2 (two) times daily with a meal. 40 tablet 3  . dicyclomine (BENTYL) 10 MG capsule Take 10 mg by mouth daily as needed for spasms.     Marland Kitchen enoxaparin (LOVENOX) 60 MG/0.6ML injection INJECT 0.65m IN TO SKIN EVERY 12 HOURS 60 Syringe 0  . fexofenadine (ALLEGRA ALLERGY) 180 MG tablet Take 180 mg by mouth daily.     . furosemide (LASIX) 40 MG tablet Take 1-2 tablets daily as directed. (Patient taking differently: Take 40 mg by mouth 2 (two) times daily as needed for fluid. ) 45 tablet 6  .  hydrOXYzine (ATARAX/VISTARIL) 25 MG tablet Take 25 mg by mouth 3 (three) times daily as needed for anxiety.    .Marland Kitchenlevothyroxine (SYNTHROID, LEVOTHROID) 88 MCG tablet Take 1 tablet (88 mcg total) by mouth daily before breakfast. 90 tablet 3  . LORazepam (ATIVAN) 0.5 MG tablet Take 1 tablet (0.5 mg total) by mouth every 8 (eight) hours as needed for anxiety. 30 tablet 0  . NITROSTAT 0.4 MG SL tablet Place 1 tablet (0.4 mg total) under the tongue every 5 (five) minutes as needed for chest pain (x 3 doses). Reported on 10/17/2015 25 tablet 5  . OXYGEN Inhale 2 L into the lungs continuous.    . potassium chloride SA (K-DUR,KLOR-CON) 20 MEQ tablet Take 0.5 tablets (10 mEq total) by mouth 2 (two) times daily as needed (taking with Lasix).    . Probiotic Product (PROBIOTIC ADVANCED PO) Take 1 tablet by mouth daily. Reported on 10/17/2015    . promethazine-codeine (PHENERGAN WITH CODEINE) 6.25-10 MG/5ML syrup Take 5 mLs by mouth every 8 (eight) hours as needed for cough. (Patient not taking: Reported on 02/12/2016) 120 mL 0  . simvastatin (ZOCOR) 20 MG tablet Take 10 mg by mouth daily.     . valsartan (DIOVAN) 320 MG tablet Take 1 tablet (320 mg total) by mouth daily. 30 tablet 5  . zolpidem (AMBIEN) 5 MG tablet Take 5 mg by mouth at bedtime.      No facility-administered medications prior to visit.      Allergies:   Alendronate sodium; Amiodarone; Chlorthalidone; Macrodantin [nitrofurantoin]; Meclizine; Norvasc [amlodipine besylate]; Hydralazine hcl; Sulfa antibiotics; and Tussionex pennkinetic er [hydrocod polst-cpm polst er]   Social History   Social History  . Marital status: Widowed    Spouse name: N/A  . Number of children: N/A  . Years of education: N/A   Social History Main Topics  . Smoking status: Former Smoker    Packs/day: 1.00    Years: 20.00    Types: Cigarettes    Quit date: 04/01/1978  . Smokeless tobacco: Never Used  . Alcohol use 4.8 oz/week    4 Glasses of wine, 4 Shots of  liquor per week     Comment: burbon or glass wine daily  . Drug use: No  . Sexual activity: No   Other Topics Concern  . None   Social History Narrative  . None  Family History:  The patient's family history includes Breast cancer in her daughter; COPD in her brother; Colon cancer in her sister; Coronary artery disease in her father; Dementia in her mother; Heart attack in her father; Liver cancer in her sister; Lung cancer in her brother.   ROS:   Please see the history of present illness.    ROS All other systems reviewed and are negative.   PHYSICAL EXAM:   VS:  BP (!) 146/92   Pulse 91   Ht 5' (1.524 m)   Wt 122 lb (55.3 kg)   SpO2 93% Comment: on 3L of O2  BMI 23.83 kg/m    GEN: Well nourished, well developed, in no acute distress  HEENT: normal  Neck: no JVD, carotid bruits, or masses Cardiac: irregular; no murmurs, rubs, or gallops,no edema  Respiratory:  clear to auscultation bilaterally, normal work of breathing GI: soft, nontender, nondistended, + BS MS: no deformity or atrophy  Skin: warm and dry, no rash Neuro:  Alert and Oriented x 3, Strength and sensation are intact Psych: euthymic mood, full affect  Wt Readings from Last 3 Encounters:  02/16/16 122 lb (55.3 kg)  02/16/16 123 lb 8 oz (56 kg)  02/09/16 121 lb 6.4 oz (55.1 kg)      Studies/Labs Reviewed:   EKG:  EKG is not ordered today.    Recent Labs: 02/12/2016: B Natriuretic Peptide 249.4 02/16/2016: ALT 26; BUN 29.4; Creatinine 0.8; HGB 12.5; Platelets 268; Potassium 4.6; Sodium 137   Lipid Panel    Component Value Date/Time   CHOL 153 11/08/2015 0959   TRIG 139 11/08/2015 0959   HDL 59 11/08/2015 0959   CHOLHDL 2.5 12/26/2014 0356   VLDL 19 12/26/2014 0356   LDLCALC 66 11/08/2015 0959   LDLDIRECT 93.7 04/10/2011 1000    Additional studies/ records that were reviewed today include:   Echo 12/12/2015 LV EF: 55% -    60%  ------------------------------------------------------------------- Indications:      Atrial Fibrillation (I48.91).  ------------------------------------------------------------------- History:   PMH:  Hyperlipidemia, NSVT.  Transient ischemic attack. Risk factors:  Hypertension.  ------------------------------------------------------------------- Study Conclusions  - Left ventricle: The cavity size was normal. Wall thickness was   increased in a pattern of moderate LVH. Systolic function was   normal. The estimated ejection fraction was in the range of 55%   to 60%. Wall motion was normal; there were no regional wall   motion abnormalities. Doppler parameters are consistent with high   ventricular filling pressure. - Aortic valve: A mechanical prosthesis was present. There was   trivial regurgitation. Valve area (VTI): 0.96 cm^2. Valve area   (Vmax): 0.85 cm^2. Valve area (Vmean): 0.8 cm^2. - Mitral valve: Calcified annulus. There was mild to moderate   regurgitation. - Left atrium: The atrium was severely dilated. - Right atrium: The atrium was mildly dilated. - Tricuspid valve: There was moderate regurgitation. - Pulmonary arteries: Systolic pressure was mildly to moderately   increased. PA peak pressure: 42 mm Hg (S).  Impressions:  - Normal LV systolic function; moderate LVH; elevated LV filling   pressure; severe LAE; mild RAE; mild RVE; prosthetic aortic valve   with mean gradient 18 mmHg and trace AI; mild to moderate MR;   moderate TR with mild to moderate pulmonary hypertension;   compared to 04/05/15, no significant change.   PET Scan 01/25/2016 IMPRESSION: 1. Hypermetabolic central right upper lobe 4.1 cm lung mass, which is confluent with hypermetabolic right hilar and right paratracheal adenopathy.  Hypermetabolic left adrenal nodule. Favor stage IV (T2b N2 M1b) primary bronchogenic carcinoma. 2. Possible small soft tissue metastasis in the lateral  left lower ventral abdominal muscle wall. 3. Nonspecific focal hypermetabolism at the anorectal junction without definite CT correlate, favor physiologic uptake, although a neoplasm cannot be excluded and clinical correlation is necessary. 4. Mild patchy tree-in-bud opacity in the anterior left upper lobe with associated low level metabolism, favor mild infectious or inflammatory bronchiolitis. 5. Postobstructive pneumonitis in the right upper lobe. 6. Cardiomegaly. Small layering right pleural effusion. No appreciable pleural nodularity or hypermetabolism . 7. Duplicated SVC. Aberrant right subclavian artery. Aortic atherosclerosis. Stable ascending thoracic aortic aneurysm, maximum diameter 4.8 cm. Stable prominently dilated main pulmonary artery suggesting chronic pulmonary arterial hypertension. 8. Polycystic kidneys.   ASSESSMENT:    1. Atypical chest pain   2. Malignant neoplasm of right lung, unspecified part of lung ([place])   3. H/O mechanical aortic valve replacement   4. Essential hypertension   5. Hyperlipidemia, unspecified hyperlipidemia type   6. Hypothyroidism, unspecified type   7. Permanent atrial fibrillation ([place])   8. Thoracic ascending aortic aneurysm ([facility])      PLAN:  In order of problems listed above:  1. Atypical chest pain  - Her chest pain is very atypical, no recent episodes of chest pain prior to today. She has had 4-5 episodes of sharp stabbing pain radiating to the back lasting only seconds. It is also reproducible on palpation. Discussed with Dr. Oval Linsey, I have given the patient 30 tablets of 50 mg of tramadol to be used as needed every 6 hours.  2. Lung cancer: Diagnosed in October, confirmed through biopsy to be small cell carcinoma.  3. H/o mechanical AVR: Stable on recent echocardiogram. Currently on Lovenox due to recent biopsy. Dr. Oval Linsey plan to get in touch with her oncologist Dr. Jana Hakim to see if she can be placed back on  Coumadin.  4. Permanent atrial fibrillation: Rate controlled on physical exam.  5. HTN: Stable on current medication  6. HLD: On Zocor 10 mg daily  7. Hypothyroidism: On levothyroxine  8. Thoracic aortic aneurysm: Recent CT of the chest obtained on 02/12/2016 showed mild aneurysmal dilatation of the ascending thoracic aorta 4.4 cm.    Medication Adjustments/Labs and Tests Ordered: Current medicines are reviewed at length with the patient today.  Concerns regarding medicines are outlined above.  Medication changes, Labs and Tests ordered today are listed in the Patient Instructions below. Patient Instructions  Your physician has recommended you make the following change in your medication:  MAY TAKE  TRAMADOL  50 MG  1  TAB  EVERY   6 HOURS   AS NEEDED  Your physician recommends that you schedule a follow-up appointment in:  [place], Almyra Deforest, [place]  02/16/2016 9:21 PM    [address] [place], [place], [place]  [address] Phone: [phone]; Fax: [phone]

## 2016-02-16 NOTE — Progress Notes (Signed)
[place] [phone]   [phone]  OFFICE PROGRESS NOTE  Mathews Argyle, MD [address] [place] [address]  DIAGNOSIS: Extensive stage (T2b, N2, M1b) small cell lung cancer presented with large right hilar mass in addition to mediastinal lymphadenopathy and metastatic left adrenal gland nodule diagnosed in November 2017.  PRIOR THERAPY: None  CURRENT THERAPY: None  INTERVAL HISTORY: Deborah [name] 82 y.o. female returns to the clinic today for follow-up visit accompanied by several family members including her son and daughter. The patient has been complaining of increasing pain in the chest with radiation to the back in addition to shortness breath and she is currently on home oxygen. She also had few episodes of hemoptysis after the bronchoscopy which was performed recently under the care of Dr. Lamonte Sakai. Her final pathology was consistent with small cell carcinoma. Her condition continues to progress rapidly and the patient is not comfortable. She denied having any current fever or chills. She has no nausea or vomiting. She denied having any headache or visual changes. CT scan of the head performed recently showed no evidence for metastatic disease to the brain. She is here today for evaluation and discussion of her treatment options.  MEDICAL HISTORY: Past Medical History:  Diagnosis Date  . Acute CHF (congestive heart failure) ([place]) 01/10/2016  . Anemia   . Arthritis    "fingers, toes, hips; qwhere" (05/19/2013)  . Breast cancer ([place]) dx'd 1994   "right; S/P lumpectomy, chemo, XRT"   . Chronic lower GI bleeding   . Complication of anesthesia    "once I'm awakened, I don't sleep til the following night" BP drops very low had to stop putting in [place] -a- cath  . GERD (gastroesophageal reflux disease)   . Gout   . Heart murmur   . High cholesterol   . History of blood transfusion 1996; ~ 2013   "related to valve  replacement; GI bleeding"   . History of echocardiogram    Echo 1/17: mild LVH, EF 50-55%, no RWMA, mild AI, mechanical AVR with mean 21 mmHg/peak 42 mmHg (stable since 2012), mod MR, mod to severe BAE, PASP 36 mmHg  . History of nuclear stress test    Myoview 1/17: EF 59%, normal perfusion, low risk  . Hypertension   . Hypothyroidism   . Lung cancer ([place]) dx'd 01/2016   SCL  . On supplemental oxygen therapy   . Permanent atrial fibrillation ([place])       . Pneumonia 07/2011  . Rectus sheath hematoma 12/19/14    held anticoagulation for 1 week, transfused  . Stomach problems    seeing Dr Watt Climes  . TIA (transient ischemic attack) 12/24/14   symptoms resolved    ALLERGIES:  is allergic to alendronate sodium; amiodarone; chlorthalidone; macrodantin [nitrofurantoin]; meclizine; norvasc [amlodipine besylate]; hydralazine hcl; sulfa antibiotics; and tussionex pennkinetic er [hydrocod polst-cpm polst er].  MEDICATIONS:  Current Outpatient Prescriptions  Medication Sig Dispense Refill  . acetaminophen (TYLENOL) 500 MG tablet Take 1,000 mg by mouth every 6 (six) hours as needed for mild pain, moderate pain or headache. Reported on 09/26/2015    . allopurinol (ZYLOPRIM) 300 MG tablet Take 300 mg by mouth at bedtime.     Marland Kitchen amLODipine (NORVASC) 5 MG tablet Take 5 mg by mouth at bedtime.    Marland Kitchen azithromycin (ZITHROMAX) 250 MG tablet Take 250-500 mg by mouth daily. Take 500 mg now and 250 mg daily x  5 days  0  . calcium-vitamin D 500 MG tablet Take 1 tablet by mouth 2 (two) times daily. Reported on 09/26/2015    . carvedilol (COREG) 6.25 MG tablet TAKE 6.25 MG BY MOUTH TWICE DAILY 60 tablet 1  . celecoxib (CELEBREX) 200 MG capsule Take 200 mg by mouth daily as needed for moderate pain.   4  . cloNIDine (CATAPRES) 0.1 MG tablet Take 0.1 mg by mouth daily.     Marland Kitchen dexamethasone (DECADRON) 4 MG tablet Take 2 tablets (8 mg total) by mouth 2 (two) times daily with a meal. 40 tablet 3  . dicyclomine (BENTYL) 10  MG capsule Take 10 mg by mouth daily as needed for spasms.     Marland Kitchen enoxaparin (LOVENOX) 60 MG/0.6ML injection INJECT 0.42m IN TO SKIN EVERY 12 HOURS 60 Syringe 0  . fexofenadine (ALLEGRA ALLERGY) 180 MG tablet Take 180 mg by mouth daily.     . furosemide (LASIX) 40 MG tablet Take 1-2 tablets daily as directed. (Patient taking differently: Take 40 mg by mouth 2 (two) times daily as needed for fluid. ) 45 tablet 6  . hydrOXYzine (ATARAX/VISTARIL) 25 MG tablet Take 25 mg by mouth 3 (three) times daily as needed for anxiety.    .Marland Kitchenlevothyroxine (SYNTHROID, LEVOTHROID) 88 MCG tablet Take 1 tablet (88 mcg total) by mouth daily before breakfast. 90 tablet 3  . LORazepam (ATIVAN) 0.5 MG tablet Take 1 tablet (0.5 mg total) by mouth every 8 (eight) hours as needed for anxiety. 30 tablet 0  . NITROSTAT 0.4 MG SL tablet Place 1 tablet (0.4 mg total) under the tongue every 5 (five) minutes as needed for chest pain (x 3 doses). Reported on 10/17/2015 25 tablet 5  . OXYGEN Inhale 2 L into the lungs continuous.    . potassium chloride SA (K-DUR,KLOR-CON) 20 MEQ tablet Take 0.5 tablets (10 mEq total) by mouth 2 (two) times daily as needed (taking with Lasix).    . Probiotic Product (PROBIOTIC ADVANCED PO) Take 1 tablet by mouth daily. Reported on 10/17/2015    . promethazine-codeine (PHENERGAN WITH CODEINE) 6.25-10 MG/5ML syrup Take 5 mLs by mouth every 8 (eight) hours as needed for cough. (Patient not taking: Reported on 02/12/2016) 120 mL 0  . simvastatin (ZOCOR) 20 MG tablet Take 10 mg by mouth daily.     . valsartan (DIOVAN) 320 MG tablet Take 1 tablet (320 mg total) by mouth daily. 30 tablet 5  . zolpidem (AMBIEN) 5 MG tablet Take 5 mg by mouth at bedtime.      No current facility-administered medications for this visit.     SURGICAL HISTORY:  Past Surgical History:  Procedure Laterality Date  . ABDOMINAL HYSTERECTOMY    . AORTIC VALVE REPLACEMENT  1996   [employer]  . APPENDECTOMY    . arteriogram  01/12    NO RAS   . BREAST BIOPSY Right   . BREAST LUMPECTOMY Right   . CARDIAC CATHETERIZATION    . CARDIOVERSION     X 2  . CATARACT EXTRACTION W/ INTRAOCULAR LENS  IMPLANT, BILATERAL Bilateral    bilateral cataract with lens implants  . CHOLECYSTECTOMY    . COLONOSCOPY WITH PROPOFOL N/A 11/24/2012   Procedure: COLONOSCOPY WITH PROPOFOL;  Surgeon: MJeryl Columbia MD;  Location: [facility];  Service: Endoscopy;  Laterality: N/A;  . ESOPHAGOGASTRODUODENOSCOPY (EGD) WITH PROPOFOL N/A 07/25/2015   Procedure: ESOPHAGOGASTRODUODENOSCOPY (EGD) WITH PROPOFOL;  Surgeon: MClarene Essex MD;  Location: [facility]  Service:  Endoscopy;  Laterality: N/A;  . HOT HEMOSTASIS N/A 07/25/2015   Procedure: HOT HEMOSTASIS (ARGON PLASMA COAGULATION/BICAP);  Surgeon: Clarene Essex, MD;  Location: [facility];  Service: Endoscopy;  Laterality: N/A;  . VIDEO BRONCHOSCOPY WITH ENDOBRONCHIAL ULTRASOUND N/A 02/07/2016   Procedure: VIDEO BRONCHOSCOPY WITH ENDOBRONCHIAL ULTRASOUND;  Surgeon: Collene Gobble, MD;  Location: [facility];  Service: Thoracic;  Laterality: N/A;    REVIEW OF SYSTEMS:  Constitutional: positive for anorexia and fatigue Eyes: negative Ears, nose, mouth, throat, and face: negative Respiratory: positive for cough, dyspnea on exertion, hemoptysis and pleurisy/chest pain Cardiovascular: negative Gastrointestinal: negative Genitourinary:negative Integument/breast: negative Hematologic/lymphatic: negative Musculoskeletal:negative Neurological: negative Behavioral/Psych: negative Endocrine: negative Allergic/Immunologic: negative   PHYSICAL EXAMINATION: General appearance: alert, cooperative, fatigued and mild distress Head: Normocephalic, without obvious abnormality, atraumatic Neck: no adenopathy, no JVD, supple, symmetrical, trachea midline and thyroid not enlarged, symmetric, no tenderness/mass/nodules Lymph nodes: Cervical, supraclavicular, and axillary nodes normal. Resp: diminished breath sounds RUL and  wheezes RML and RUL Back: symmetric, no curvature. ROM normal. No CVA tenderness. Cardio: regular rate and rhythm, S1, S2 normal, no murmur, click, rub or gallop GI: soft, non-tender; bowel sounds normal; no masses,  no organomegaly Extremities: extremities normal, atraumatic, no cyanosis or edema Neurologic: Alert and oriented X 3, normal strength and tone. Normal symmetric reflexes. Normal coordination and gait  ECOG PERFORMANCE STATUS: 1 - Symptomatic but completely ambulatory  Blood pressure (!) 161/110, pulse 90, temperature 97.9 F (36.6 C), temperature source Oral, resp. rate 17, height 5' (1.524 m), weight 123 lb 8 oz (56 kg), SpO2 92 %.  LABORATORY DATA: Lab Results  Component Value Date   WBC 4.7 02/12/2016   HGB 10.6 (L) 02/12/2016   HCT 33.1 (L) 02/12/2016   MCV 100.9 (H) 02/12/2016   PLT 243 02/12/2016      Chemistry      Component Value Date/Time   NA 135 02/12/2016 0927   NA 136 01/15/2016 1108   K 4.1 02/12/2016 0927   K 4.5 01/15/2016 1108   CL 96 (L) 02/12/2016 0927   CO2 33 (H) 02/12/2016 0927   CO2 25 01/15/2016 1108   BUN 38 (H) 02/12/2016 0927   BUN 76.2 (H) 01/15/2016 1108   CREATININE 1.01 (H) 02/12/2016 0927   CREATININE 1.3 (H) 01/15/2016 1108      Component Value Date/Time   CALCIUM 10.3 02/12/2016 0927   CALCIUM 10.9 (H) 01/15/2016 1108   ALKPHOS 40 02/12/2016 0927   ALKPHOS 37 (L) 01/15/2016 1108   AST 16 02/12/2016 0927   AST 15 01/15/2016 1108   ALT 23 02/12/2016 0927   ALT 19 01/15/2016 1108   BILITOT 0.8 02/12/2016 0927   BILITOT 0.30 01/15/2016 1108       RADIOGRAPHIC STUDIES: Dg Chest 2 View  Result Date: 02/12/2016 CLINICAL DATA:  Hemoptysis.  Shortness of breath. EXAM: CHEST  2 VIEW COMPARISON:  01/11/2016 FINDINGS: Left Port-A-Cath is in place, unchanged with the tip in the persistent left-sided SVC. Prior median sternotomy. There is cardiomegaly. Increasing right perihilar and right basilar airspace disease. No confluent  opacity on the left. Small right pleural effusion. IMPRESSION: Increasing right perihilar and right lower lobe airspace opacities concerning for pneumonia. Followup PA and lateral chest X-ray is recommended in 3-4 weeks following trial of antibiotic therapy to ensure resolution and exclude underlying malignancy. Small right pleural effusion. Electronically Signed   By: Rolm Baptise M.D.   On: 02/12/2016 09:51   Ct Head W Wo Contrast  Result Date:  02/12/2016 CLINICAL DATA:  Metastatic lung cancer.  Staging. EXAM: CT HEAD WITHOUT AND WITH CONTRAST TECHNIQUE: Contiguous axial images were obtained from the base of the skull through the vertex without and with intravenous contrast CONTRAST:  79m ISOVUE-300 IOPAMIDOL (ISOVUE-300) INJECTION 61% COMPARISON:  CT head 12/26/2014 FINDINGS: Brain: Mild atrophy. Ventricle size within normal limits. Negative for acute infarct. Negative for hemorrhage or mass. Postcontrast imaging demonstrates normal enhancement. No enhancing mass lesion identified. Vascular: Normal arterial and venous enhancement. Skull: Negative Sinuses/Orbits: Mucosal thickening and opacification of the right sphenoid sinus. No orbital lesion. Other: None IMPRESSION: Negative for metastatic disease to the brain Right sphenoid sinus chronic opacification. Electronically Signed   By: CFranchot GalloM.D.   On: 02/12/2016 11:45   Ct Chest W Contrast  Result Date: 02/12/2016 CLINICAL DATA:  New diagnosis small cell lung cancer.  Hemoptysis. EXAM: CT CHEST WITH CONTRAST TECHNIQUE: Multidetector CT imaging of the chest was performed during intravenous contrast administration. CONTRAST:  746mISOVUE-300 IOPAMIDOL (ISOVUE-300) INJECTION 61% COMPARISON:  01/10/2016 FINDINGS: Cardiovascular: Mild aneurysmal dilatation of the ascending thoracic aorta, 4.4 cm, stable since prior study. Mild cardiomegaly. Diffuse aortic calcifications. No dissection. There is a persistent left SVC noted. Port-A-Cath is in place  with the tip in the persistent left SVC. Retroesophageal right subclavian artery again noted. Mediastinum/Nodes: Necrotic appearing right paratracheal lymph nodes, enlarged since prior study. Index node on image 50 measures 2.7 x 2.9 cm compared to 2.5 x 2.7 cm previously. Right peritracheal adenopathy on image 41 measures 3.1 x 2.5 cm compared to 2.7 x 2.0 cm previously. Lungs/Pleura: Large right hilar soft tissue mass noted, enlarging since prior study, measuring 5.4 x 3.8 cm compared with 4.2 x 3.6 cm previously. This mass is partially necrotic. Moderate right pleural effusion is similar to prior study. No left effusion. Airspace disease noted in the right upper lobe likely reflects postobstructive pneumonitis. Compressive atelectasis noted in the right lower lobe. Upper Abdomen: 16 mm nodule in the left adrenal gland appearance likely new since prior CT abdomen 08/05/2015. Multiple renal and hepatic cysts are noted. Musculoskeletal: No visible focal bone lesion or acute bony abnormality. IMPRESSION: Enlarging right hilar mass with ground-glass opacities throughout the right upper lobe, likely postobstructive pneumonitis. Enlarging necrotic appearing mediastinal adenopathy. Stable moderate right pleural effusion. Cardiomegaly. Stable mild aneurysmal dilatation of the ascending thoracic aorta. Apparent new small nodule in the left adrenal gland. Cannot exclude metastasis. Electronically Signed   By: KeRolm Baptise.D.   On: 02/12/2016 11:48   Nm Pet Image Initial (pi) Skull Base To Thigh  Result Date: 01/25/2016 CLINICAL DATA:  Initial treatment strategy for right upper lobe lung mass detected on recent chest CT angiogram performed in the setting of chest tightness and shortness of breath. History of breast cancer. EXAM: NUCLEAR MEDICINE PET SKULL BASE TO THIGH TECHNIQUE: 5.8 mCi F-18 FDG was injected intravenously. Full-ring PET imaging was performed from the skull base to thigh after the radiotracer. CT data  was obtained and used for attenuation correction and anatomic localization. FASTING BLOOD GLUCOSE:  Value: 109 mg/dl COMPARISON:  01/10/2016 chest CT angiogram. 08/05/2015 CT abdomen/pelvis. FINDINGS: NECK No hypermetabolic lymph nodes in the neck. CHEST Hypermetabolic central right upper lobe 5.1 x 3.7 cm lung mass with max SUV 15.4 (series 8/image 29), which encases and occludes the associated segmental right upper lobe bronchus, and which is confluent with hypermetabolic right hilar and right lower paratracheal adenopathy. Patchy ground-glass opacity in the anterior right upper lobe is most  consistent with postobstructive pneumonitis. Separate sharply marginated focus of subpleural reticulation in the anterior right upper lobe is consistent with mild radiation fibrosis. Mild patchy tree-in-bud opacities in the anterior left upper lobe with associated low level metabolism (max SUV 2.7), stable since 01/10/2016, favor mild infectious or inflammatory bronchiolitis. No additional significant pulmonary nodules. Small layering right pleural effusion. No left pleural effusion. No pleural nodularity or hypermetabolism. Hypermetabolic right paratracheal adenopathy, for example a 2.7 cm right lower paratracheal node with max SUV 13.6 (series 4/image 50). No contralateral mediastinal or contralateral hilar hypermetabolic adenopathy. Surgical clips are again noted in the right axilla. No hypermetabolic axillary lymph nodes. Duplicated superior vena cava. Left internal jugular MediPort terminates within the lower third of the left superior vena cava. Moderate cardiomegaly. Left main, left anterior descending, left circumflex and right coronary atherosclerosis. Stable atherosclerotic aneurysmal ascending thoracic aorta, maximum diameter 4.8 cm. Aberrant nonaneurysmal right subclavian artery arising from the distal aortic arch with retroesophageal course. Stable prominently dilated main pulmonary artery (4.7 cm diameter).  ABDOMEN/PELVIS Left adrenal 1.7 cm hypermetabolic nodule with max SUV 12.0 (series 4/image 95), new since 08/05/2015. No right adrenal hypermetabolism or nodularity. Focus of hypermetabolism in the lateral left lower ventral abdominal muscle wall with associated vague low-attenuation 1.4 cm lesion on the CT images with max SUV 7.1 (series 4/image 123). Focal hypermetabolism at the anorectal junction with max SUV 6.6, without discrete mass or definite wall thickening on the CT images. No abnormal hypermetabolic activity within the liver, pancreas, or spleen. No hypermetabolic lymph nodes in the abdomen or pelvis. Cholecystectomy. Atherosclerotic nonaneurysmal abdominal aorta. Grossly stable enlarged polycystic kidneys with scattered subcentimeter hyperdense renal cortical lesions throughout both kidneys, too small to characterize. Largest simple renal cyst measures 6.0 cm in the far lower right kidney and 7.4 cm in the lower left kidney. Hysterectomy. SKELETON No focal hypermetabolic activity to suggest skeletal metastasis. Sternotomy wires appear aligned and intact. IMPRESSION: 1. Hypermetabolic central right upper lobe 4.1 cm lung mass, which is confluent with hypermetabolic right hilar and right paratracheal adenopathy. Hypermetabolic left adrenal nodule. Favor stage IV (T2b N2 M1b) primary bronchogenic carcinoma. 2. Possible small soft tissue metastasis in the lateral left lower ventral abdominal muscle wall. 3. Nonspecific focal hypermetabolism at the anorectal junction without definite CT correlate, favor physiologic uptake, although a neoplasm cannot be excluded and clinical correlation is necessary. 4. Mild patchy tree-in-bud opacity in the anterior left upper lobe with associated low level metabolism, favor mild infectious or inflammatory bronchiolitis. 5. Postobstructive pneumonitis in the right upper lobe. 6. Cardiomegaly. Small layering right pleural effusion. No appreciable pleural nodularity or  hypermetabolism . 7. Duplicated SVC. Aberrant right subclavian artery. Aortic atherosclerosis. Stable ascending thoracic aortic aneurysm, maximum diameter 4.8 cm. Stable prominently dilated main pulmonary artery suggesting chronic pulmonary arterial hypertension. 8. Polycystic kidneys. Electronically Signed   By: Ilona Sorrel M.D.   On: 01/25/2016 16:00    ASSESSMENT AND PLAN: This is a very pleasant 82 years old white female recently diagnosed with extensive stage small cell lung cancer presented with large right hilar mass in addition to mediastinal lymphadenopathy as well as left adrenal gland metastasis diagnosed in November 2017. The patient also has a history of breast cancer more than 20 years ago. She status post right breast lumpectomy and radiotherapy as well as systemic chemotherapy. I had a lengthy discussion with the patient and her family about her current disease stage, prognosis and treatment options. The patient and her family understand that without  treatment her median survival is in the range of 6 weeks to 3 months and was treatment the median survival is around 9 months. She is very reluctant about considering systemic chemotherapy. I gave her several options for management of her condition including palliative care and hospice referral versus systemic chemotherapy with reduced dose carboplatin and etoposide in addition to palliative radiotherapy. The patient is concerned about her quality of life and the adverse effect of the chemotherapy. At the end she agreed to consider palliative radiotherapy and I discussed her case with Dr. Lisbeth Renshaw who kindly agreed to see the patient later today for simulation and consideration of starting radiotherapy early next week. She also complained of left-sided back pain and her daughter and family were concerned about her history of cardiac disease. We ordered an EKG which showed some questionable lateral ischemia. We contacted her cardiologist Dr.  Oval Linsey and the recommended for the patient to go immediately to the office for further evaluation. She also has hypertension today and this will be managed by her primary care physician and cardiologist. For anxiety, I started the patient on Ativan 0.5 mg by mouth every 8 hours as needed for anxiety. For the history of anemia, we repeated CBC today which showed normal hemoglobin and hematocrit. The patient and her family had several questions and I answered them completely to their satisfaction. I would see her back for follow-up visit in 4 weeks for evaluation after completion of her radiotherapy to see if the patient is interested in any further treatment with systemic therapy. The patient voices understanding of current disease status and treatment options and is in agreement with the current care plan.  All questions were answered. The patient knows to call the clinic with any problems, questions or concerns. We can certainly see the patient much sooner if necessary.  I spent 30 minutes counseling the patient face to face. The total time spent in the appointment was 50 minutes.  Disclaimer: This note was dictated with voice recognition software. Similar sounding words can inadvertently be transcribed and may not be corrected upon review.

## 2016-02-16 NOTE — Telephone Encounter (Signed)
Call to Dr. Skeet Latch- Cardiology, discussed EKG on pt due to complaints of left chest and back pain.  Spoke with triage pt will be seen at 2:30pm today, copy of EKG faxed to [phone]. MD discussed above with pt and family.

## 2016-02-16 NOTE — Patient Instructions (Signed)
Your physician has recommended you make the following change in your medication:  MAY TAKE  TRAMADOL  50 MG  1  TAB  EVERY   6 HOURS   AS NEEDED  Your physician recommends that you schedule a follow-up appointment in:  AS PLANNED

## 2016-02-16 NOTE — Telephone Encounter (Signed)
New message  Deborah [name] is calling from [facility]  Abnormal EKG  Does pt need to go to ER

## 2016-02-17 ENCOUNTER — Encounter (HOSPITAL_COMMUNITY): Payer: Self-pay

## 2016-02-17 ENCOUNTER — Telehealth: Payer: Self-pay | Admitting: Pulmonary Disease

## 2016-02-17 ENCOUNTER — Inpatient Hospital Stay (HOSPITAL_COMMUNITY)
Admission: EM | Admit: 2016-02-17 | Discharge: 2016-02-22 | DRG: 187 | Disposition: A | Discharge status: Home / Self Care | Payer: Medicare Other | Attending: Internal Medicine | Admitting: Internal Medicine

## 2016-02-17 ENCOUNTER — Emergency Department (HOSPITAL_COMMUNITY): Payer: Medicare Other

## 2016-02-17 DIAGNOSIS — I119 Hypertensive heart disease without heart failure: Secondary | ICD-10-CM

## 2016-02-17 DIAGNOSIS — Z954 Presence of other heart-valve replacement: Secondary | ICD-10-CM

## 2016-02-17 DIAGNOSIS — Z9841 Cataract extraction status, right eye: Secondary | ICD-10-CM

## 2016-02-17 DIAGNOSIS — Z7901 Long term (current) use of anticoagulants: Secondary | ICD-10-CM

## 2016-02-17 DIAGNOSIS — Z952 Presence of prosthetic heart valve: Secondary | ICD-10-CM

## 2016-02-17 DIAGNOSIS — C3401 Malignant neoplasm of right main bronchus: Secondary | ICD-10-CM

## 2016-02-17 DIAGNOSIS — Z882 Allergy status to sulfonamides status: Secondary | ICD-10-CM

## 2016-02-17 DIAGNOSIS — Z800 Family history of malignant neoplasm of digestive organs: Secondary | ICD-10-CM

## 2016-02-17 DIAGNOSIS — Z853 Personal history of malignant neoplasm of breast: Secondary | ICD-10-CM

## 2016-02-17 DIAGNOSIS — Z803 Family history of malignant neoplasm of breast: Secondary | ICD-10-CM

## 2016-02-17 DIAGNOSIS — Z881 Allergy status to other antibiotic agents status: Secondary | ICD-10-CM

## 2016-02-17 DIAGNOSIS — J9601 Acute respiratory failure with hypoxia: Secondary | ICD-10-CM

## 2016-02-17 DIAGNOSIS — J90 Pleural effusion, not elsewhere classified: Principal | ICD-10-CM | POA: Diagnosis present

## 2016-02-17 DIAGNOSIS — E876 Hypokalemia: Secondary | ICD-10-CM

## 2016-02-17 DIAGNOSIS — K921 Melena: Secondary | ICD-10-CM

## 2016-02-17 DIAGNOSIS — I712 Thoracic aortic aneurysm, without rupture: Secondary | ICD-10-CM

## 2016-02-17 DIAGNOSIS — R471 Dysarthria and anarthria: Secondary | ICD-10-CM

## 2016-02-17 DIAGNOSIS — I482 Chronic atrial fibrillation, unspecified: Secondary | ICD-10-CM

## 2016-02-17 DIAGNOSIS — Z8673 Personal history of transient ischemic attack (TIA), and cerebral infarction without residual deficits: Secondary | ICD-10-CM

## 2016-02-17 DIAGNOSIS — Z888 Allergy status to other drugs, medicaments and biological substances status: Secondary | ICD-10-CM

## 2016-02-17 DIAGNOSIS — Q2730 Arteriovenous malformation, site unspecified: Secondary | ICD-10-CM

## 2016-02-17 DIAGNOSIS — C3491 Malignant neoplasm of unspecified part of right bronchus or lung: Secondary | ICD-10-CM

## 2016-02-17 DIAGNOSIS — J40 Bronchitis, not specified as acute or chronic: Secondary | ICD-10-CM

## 2016-02-17 DIAGNOSIS — I509 Heart failure, unspecified: Secondary | ICD-10-CM | POA: Diagnosis present

## 2016-02-17 DIAGNOSIS — I472 Ventricular tachycardia: Secondary | ICD-10-CM

## 2016-02-17 DIAGNOSIS — Z9889 Other specified postprocedural states: Secondary | ICD-10-CM

## 2016-02-17 DIAGNOSIS — Z66 Do not resuscitate: Secondary | ICD-10-CM | POA: Diagnosis present

## 2016-02-17 DIAGNOSIS — R52 Pain, unspecified: Secondary | ICD-10-CM

## 2016-02-17 DIAGNOSIS — Z79899 Other long term (current) drug therapy: Secondary | ICD-10-CM

## 2016-02-17 DIAGNOSIS — K317 Polyp of stomach and duodenum: Secondary | ICD-10-CM | POA: Diagnosis present

## 2016-02-17 DIAGNOSIS — I10 Essential (primary) hypertension: Secondary | ICD-10-CM

## 2016-02-17 DIAGNOSIS — R5383 Other fatigue: Secondary | ICD-10-CM

## 2016-02-17 DIAGNOSIS — E039 Hypothyroidism, unspecified: Secondary | ICD-10-CM | POA: Diagnosis present

## 2016-02-17 DIAGNOSIS — R0602 Shortness of breath: Secondary | ICD-10-CM

## 2016-02-17 DIAGNOSIS — D62 Acute posthemorrhagic anemia: Secondary | ICD-10-CM

## 2016-02-17 DIAGNOSIS — K219 Gastro-esophageal reflux disease without esophagitis: Secondary | ICD-10-CM | POA: Diagnosis present

## 2016-02-17 DIAGNOSIS — Z961 Presence of intraocular lens: Secondary | ICD-10-CM | POA: Diagnosis present

## 2016-02-17 DIAGNOSIS — E7800 Pure hypercholesterolemia, unspecified: Secondary | ICD-10-CM | POA: Diagnosis present

## 2016-02-17 DIAGNOSIS — Z9981 Dependence on supplemental oxygen: Secondary | ICD-10-CM

## 2016-02-17 DIAGNOSIS — Z7952 Long term (current) use of systemic steroids: Secondary | ICD-10-CM

## 2016-02-17 DIAGNOSIS — R918 Other nonspecific abnormal finding of lung field: Secondary | ICD-10-CM

## 2016-02-17 DIAGNOSIS — K649 Unspecified hemorrhoids: Secondary | ICD-10-CM | POA: Diagnosis present

## 2016-02-17 DIAGNOSIS — Z801 Family history of malignant neoplasm of trachea, bronchus and lung: Secondary | ICD-10-CM

## 2016-02-17 DIAGNOSIS — I110 Hypertensive heart disease with heart failure: Secondary | ICD-10-CM | POA: Diagnosis present

## 2016-02-17 DIAGNOSIS — D689 Coagulation defect, unspecified: Secondary | ICD-10-CM

## 2016-02-17 DIAGNOSIS — R0600 Dyspnea, unspecified: Secondary | ICD-10-CM | POA: Diagnosis present

## 2016-02-17 DIAGNOSIS — Z90710 Acquired absence of both cervix and uterus: Secondary | ICD-10-CM

## 2016-02-17 DIAGNOSIS — R5381 Other malaise: Secondary | ICD-10-CM

## 2016-02-17 DIAGNOSIS — Z923 Personal history of irradiation: Secondary | ICD-10-CM

## 2016-02-17 DIAGNOSIS — C3411 Malignant neoplasm of upper lobe, right bronchus or lung: Secondary | ICD-10-CM | POA: Diagnosis present

## 2016-02-17 DIAGNOSIS — D500 Iron deficiency anemia secondary to blood loss (chronic): Secondary | ICD-10-CM | POA: Diagnosis present

## 2016-02-17 DIAGNOSIS — I7121 Aneurysm of the ascending aorta, without rupture: Secondary | ICD-10-CM

## 2016-02-17 DIAGNOSIS — K922 Gastrointestinal hemorrhage, unspecified: Secondary | ICD-10-CM

## 2016-02-17 DIAGNOSIS — Z9842 Cataract extraction status, left eye: Secondary | ICD-10-CM

## 2016-02-17 DIAGNOSIS — E785 Hyperlipidemia, unspecified: Secondary | ICD-10-CM

## 2016-02-17 DIAGNOSIS — M109 Gout, unspecified: Secondary | ICD-10-CM | POA: Diagnosis present

## 2016-02-17 DIAGNOSIS — I878 Other specified disorders of veins: Secondary | ICD-10-CM

## 2016-02-17 DIAGNOSIS — Z9221 Personal history of antineoplastic chemotherapy: Secondary | ICD-10-CM

## 2016-02-17 DIAGNOSIS — I6330 Cerebral infarction due to thrombosis of unspecified cerebral artery: Secondary | ICD-10-CM

## 2016-02-17 DIAGNOSIS — C799 Secondary malignant neoplasm of unspecified site: Secondary | ICD-10-CM | POA: Diagnosis present

## 2016-02-17 DIAGNOSIS — T148XXA Other injury of unspecified body region, initial encounter: Secondary | ICD-10-CM

## 2016-02-17 DIAGNOSIS — Z87891 Personal history of nicotine dependence: Secondary | ICD-10-CM

## 2016-02-17 DIAGNOSIS — I4729 Other ventricular tachycardia: Secondary | ICD-10-CM

## 2016-02-17 DIAGNOSIS — R590 Localized enlarged lymph nodes: Secondary | ICD-10-CM

## 2016-02-17 DIAGNOSIS — J9611 Chronic respiratory failure with hypoxia: Secondary | ICD-10-CM | POA: Diagnosis present

## 2016-02-17 DIAGNOSIS — R042 Hemoptysis: Secondary | ICD-10-CM | POA: Diagnosis present

## 2016-02-17 DIAGNOSIS — E871 Hypo-osmolality and hyponatremia: Secondary | ICD-10-CM

## 2016-02-17 LAB — BASIC METABOLIC PANEL
ANION GAP: 8 (ref 5–15)
BUN: 37 mg/dL — ABNORMAL HIGH (ref 6–20)
CALCIUM: 10 mg/dL (ref 8.9–10.3)
CO2: 33 mmol/L — AB (ref 22–32)
Chloride: 92 mmol/L — ABNORMAL LOW (ref 101–111)
Creatinine, Ser: 0.88 mg/dL (ref 0.44–1.00)
GFR calc non Af Amer: 57 mL/min — ABNORMAL LOW (ref >60)
Glucose, Bld: 110 mg/dL — ABNORMAL HIGH (ref 65–99)
POTASSIUM: 4.2 mmol/L (ref 3.5–5.1)
Sodium: 133 mmol/L — ABNORMAL LOW (ref 135–145)

## 2016-02-17 LAB — CBC WITH DIFFERENTIAL/PLATELET
BASOS ABS: 0 10*3/uL (ref 0.0–0.1)
BASOS PCT: 0 %
Eosinophils Absolute: 0 10*3/uL (ref 0.0–0.7)
Eosinophils Relative: 0 %
HEMATOCRIT: 37.4 % (ref 36.0–46.0)
HEMOGLOBIN: 12 g/dL (ref 12.0–15.0)
Lymphocytes Relative: 8 %
Lymphs Abs: 0.7 10*3/uL (ref 0.7–4.0)
MCH: 32.7 pg (ref 26.0–34.0)
MCHC: 32.1 g/dL (ref 30.0–36.0)
MCV: 101.9 fL — ABNORMAL HIGH (ref 78.0–100.0)
MONO ABS: 0.7 10*3/uL (ref 0.1–1.0)
Monocytes Relative: 7 %
NEUTROS ABS: 7.8 10*3/uL — AB (ref 1.7–7.7)
NEUTROS PCT: 85 %
Platelets: 286 10*3/uL (ref 150–400)
RBC: 3.67 MIL/uL — AB (ref 3.87–5.11)
RDW: 16.5 % — AB (ref 11.5–15.5)
WBC: 9.2 10*3/uL (ref 4.0–10.5)

## 2016-02-17 NOTE — ED Notes (Signed)
Bed: CN63 Expected date:  Expected time:  Means of arrival:  Comments: 82yo F cough blood

## 2016-02-17 NOTE — ED Notes (Signed)
No respiratory or acute distress noted alert and oriented x 3 call light in reach family at bedside. 

## 2016-02-17 NOTE — ED Notes (Signed)
Talked with doctor and got ice chips ordered and given to pt.

## 2016-02-17 NOTE — Telephone Encounter (Signed)
Called by patient's daughter who relates that the patient is coughing up larger quantities of blood estimated to be a teaspoon every 4 hours. Clinical situation complicated by the fact that the patient is on Lovenox for a mechanical heart valve. I advised the daughter and the patient to come to the Emergency Department for evaluation possible admission.

## 2016-02-17 NOTE — ED Notes (Signed)
Pt sts she would like her port access for blood work

## 2016-02-17 NOTE — ED Triage Notes (Signed)
Coughing up blood since Tuesday no other complaints had lung biopsy Tuesday from [facility].

## 2016-02-18 ENCOUNTER — Observation Stay (HOSPITAL_COMMUNITY): Payer: Medicare Other

## 2016-02-18 DIAGNOSIS — K219 Gastro-esophageal reflux disease without esophagitis: Secondary | ICD-10-CM | POA: Diagnosis present

## 2016-02-18 DIAGNOSIS — Z9221 Personal history of antineoplastic chemotherapy: Secondary | ICD-10-CM | POA: Diagnosis not present

## 2016-02-18 DIAGNOSIS — I110 Hypertensive heart disease with heart failure: Secondary | ICD-10-CM | POA: Diagnosis present

## 2016-02-18 DIAGNOSIS — R042 Hemoptysis: Secondary | ICD-10-CM | POA: Diagnosis present

## 2016-02-18 DIAGNOSIS — C799 Secondary malignant neoplasm of unspecified site: Secondary | ICD-10-CM | POA: Diagnosis present

## 2016-02-18 DIAGNOSIS — R0600 Dyspnea, unspecified: Secondary | ICD-10-CM | POA: Diagnosis present

## 2016-02-18 DIAGNOSIS — Z90710 Acquired absence of both cervix and uterus: Secondary | ICD-10-CM | POA: Diagnosis not present

## 2016-02-18 DIAGNOSIS — E7800 Pure hypercholesterolemia, unspecified: Secondary | ICD-10-CM | POA: Diagnosis present

## 2016-02-18 DIAGNOSIS — I509 Heart failure, unspecified: Secondary | ICD-10-CM | POA: Diagnosis present

## 2016-02-18 DIAGNOSIS — Z66 Do not resuscitate: Secondary | ICD-10-CM | POA: Diagnosis present

## 2016-02-18 DIAGNOSIS — Z853 Personal history of malignant neoplasm of breast: Secondary | ICD-10-CM | POA: Diagnosis not present

## 2016-02-18 DIAGNOSIS — I712 Thoracic aortic aneurysm, without rupture: Secondary | ICD-10-CM | POA: Diagnosis not present

## 2016-02-18 DIAGNOSIS — C3491 Malignant neoplasm of unspecified part of right bronchus or lung: Secondary | ICD-10-CM | POA: Diagnosis not present

## 2016-02-18 DIAGNOSIS — Z923 Personal history of irradiation: Secondary | ICD-10-CM | POA: Diagnosis not present

## 2016-02-18 DIAGNOSIS — J9601 Acute respiratory failure with hypoxia: Secondary | ICD-10-CM | POA: Diagnosis not present

## 2016-02-18 DIAGNOSIS — Z9842 Cataract extraction status, left eye: Secondary | ICD-10-CM | POA: Diagnosis not present

## 2016-02-18 DIAGNOSIS — J910 Malignant pleural effusion: Secondary | ICD-10-CM | POA: Diagnosis not present

## 2016-02-18 DIAGNOSIS — Z7952 Long term (current) use of systemic steroids: Secondary | ICD-10-CM | POA: Diagnosis not present

## 2016-02-18 DIAGNOSIS — Z7901 Long term (current) use of anticoagulants: Secondary | ICD-10-CM | POA: Diagnosis not present

## 2016-02-18 DIAGNOSIS — J9611 Chronic respiratory failure with hypoxia: Secondary | ICD-10-CM | POA: Diagnosis present

## 2016-02-18 DIAGNOSIS — Z9841 Cataract extraction status, right eye: Secondary | ICD-10-CM | POA: Diagnosis not present

## 2016-02-18 DIAGNOSIS — J90 Pleural effusion, not elsewhere classified: Secondary | ICD-10-CM | POA: Diagnosis present

## 2016-02-18 DIAGNOSIS — Z952 Presence of prosthetic heart valve: Secondary | ICD-10-CM | POA: Diagnosis not present

## 2016-02-18 DIAGNOSIS — R0602 Shortness of breath: Secondary | ICD-10-CM | POA: Diagnosis not present

## 2016-02-18 DIAGNOSIS — I482 Chronic atrial fibrillation: Secondary | ICD-10-CM | POA: Diagnosis present

## 2016-02-18 DIAGNOSIS — Z8673 Personal history of transient ischemic attack (TIA), and cerebral infarction without residual deficits: Secondary | ICD-10-CM | POA: Diagnosis not present

## 2016-02-18 DIAGNOSIS — Z961 Presence of intraocular lens: Secondary | ICD-10-CM | POA: Diagnosis present

## 2016-02-18 DIAGNOSIS — C3411 Malignant neoplasm of upper lobe, right bronchus or lung: Secondary | ICD-10-CM | POA: Diagnosis present

## 2016-02-18 DIAGNOSIS — E039 Hypothyroidism, unspecified: Secondary | ICD-10-CM | POA: Diagnosis present

## 2016-02-18 DIAGNOSIS — C3401 Malignant neoplasm of right main bronchus: Secondary | ICD-10-CM | POA: Insufficient documentation

## 2016-02-18 DIAGNOSIS — Q2730 Arteriovenous malformation, site unspecified: Secondary | ICD-10-CM | POA: Diagnosis not present

## 2016-02-18 DIAGNOSIS — Z9981 Dependence on supplemental oxygen: Secondary | ICD-10-CM | POA: Diagnosis not present

## 2016-02-18 LAB — CBC
HEMATOCRIT: 34.3 % — AB (ref 36.0–46.0)
HEMOGLOBIN: 10.9 g/dL — AB (ref 12.0–15.0)
MCH: 32.7 pg (ref 26.0–34.0)
MCHC: 31.8 g/dL (ref 30.0–36.0)
MCV: 103 fL — ABNORMAL HIGH (ref 78.0–100.0)
Platelets: 231 10*3/uL (ref 150–400)
RBC: 3.33 MIL/uL — AB (ref 3.87–5.11)
RDW: 16.8 % — ABNORMAL HIGH (ref 11.5–15.5)
WBC: 6.3 10*3/uL (ref 4.0–10.5)

## 2016-02-18 LAB — BASIC METABOLIC PANEL
ANION GAP: 6 (ref 5–15)
BUN: 38 mg/dL — ABNORMAL HIGH (ref 6–20)
CHLORIDE: 92 mmol/L — AB (ref 101–111)
CO2: 36 mmol/L — AB (ref 22–32)
Calcium: 9.5 mg/dL (ref 8.9–10.3)
Creatinine, Ser: 0.79 mg/dL (ref 0.44–1.00)
GFR calc non Af Amer: >60 mL/min (ref >60)
Glucose, Bld: 95 mg/dL (ref 65–99)
POTASSIUM: 4.1 mmol/L (ref 3.5–5.1)
Sodium: 134 mmol/L — ABNORMAL LOW (ref 135–145)

## 2016-02-18 LAB — BODY FLUID CELL COUNT WITH DIFFERENTIAL
Eos, Fluid: 0 %
LYMPHS FL: 71 %
Monocyte-Macrophage-Serous Fluid: 27 % — ABNORMAL LOW (ref 50–90)
Neutrophil Count, Fluid: 2 % (ref 0–25)
Other Cells, Fluid: 0 %
Total Nucleated Cell Count, Fluid: 522 cu mm (ref 0–1000)

## 2016-02-18 LAB — LACTATE DEHYDROGENASE, PLEURAL OR PERITONEAL FLUID: LD, Fluid: 118 U/L — ABNORMAL HIGH (ref 3–23)

## 2016-02-18 LAB — PROTEIN, BODY FLUID

## 2016-02-18 MED ORDER — CARVEDILOL 6.25 MG PO TABS
6.2500 mg | ORAL_TABLET | Freq: Two times a day (BID) | ORAL | Status: DC
  Administered 2016-02-18 – 2016-02-22 (×8): 6.25 mg via ORAL
  Filled 2016-02-18 (×10): qty 1

## 2016-02-18 MED ORDER — LORATADINE 10 MG PO TABS
10.0000 mg | ORAL_TABLET | Freq: Every day | ORAL | Status: DC
  Administered 2016-02-18 – 2016-02-22 (×5): 10 mg via ORAL
  Filled 2016-02-18 (×4): qty 1

## 2016-02-18 MED ORDER — SODIUM CHLORIDE 0.9% FLUSH
10.0000 mL | INTRAVENOUS | Status: DC | PRN
  Administered 2016-02-22: 10 mL
  Filled 2016-02-18: qty 40

## 2016-02-18 MED ORDER — FUROSEMIDE 40 MG PO TABS
40.0000 mg | ORAL_TABLET | Freq: Every day | ORAL | Status: DC
  Administered 2016-02-18: 40 mg via ORAL
  Filled 2016-02-18: qty 1

## 2016-02-18 MED ORDER — IOPAMIDOL (ISOVUE-370) INJECTION 76%: 100.0000 mL | Freq: Once | INTRAVENOUS | Status: DC | PRN

## 2016-02-18 MED ORDER — TRAMADOL HCL 50 MG PO TABS: 50.0000 mg | ORAL_TABLET | Freq: Four times a day (QID) | ORAL | Status: DC | PRN

## 2016-02-18 MED ORDER — ALLOPURINOL 300 MG PO TABS
300.0000 mg | ORAL_TABLET | Freq: Every day | ORAL | Status: DC
  Administered 2016-02-18 – 2016-02-21 (×4): 300 mg via ORAL
  Filled 2016-02-18 (×5): qty 1

## 2016-02-18 MED ORDER — LORAZEPAM 0.5 MG PO TABS: 0.5000 mg | ORAL_TABLET | Freq: Three times a day (TID) | ORAL | Status: DC | PRN

## 2016-02-18 MED ORDER — SODIUM CHLORIDE 0.9 % IV SOLN
INTRAVENOUS | Status: AC
  Filled 2016-02-18: qty 250

## 2016-02-18 MED ORDER — CELECOXIB 200 MG PO CAPS
200.0000 mg | ORAL_CAPSULE | Freq: Every day | ORAL | Status: DC | PRN
  Filled 2016-02-18: qty 1

## 2016-02-18 MED ORDER — IOPAMIDOL (ISOVUE-370) INJECTION 76%
INTRAVENOUS | Status: AC
  Administered 2016-02-18: 100 mL via INTRAVENOUS
  Filled 2016-02-18: qty 100

## 2016-02-18 MED ORDER — AMLODIPINE BESYLATE 5 MG PO TABS
5.0000 mg | ORAL_TABLET | Freq: Every day | ORAL | Status: DC
  Filled 2016-02-18: qty 1

## 2016-02-18 MED ORDER — BISACODYL 5 MG PO TBEC
10.0000 mg | DELAYED_RELEASE_TABLET | Freq: Every day | ORAL | Status: DC | PRN
  Administered 2016-02-21: 10 mg via ORAL
  Filled 2016-02-18: qty 2

## 2016-02-18 MED ORDER — CLONIDINE HCL 0.1 MG PO TABS
0.1000 mg | ORAL_TABLET | Freq: Every day | ORAL | Status: DC
  Administered 2016-02-19 – 2016-02-20 (×2): 0.1 mg via ORAL
  Filled 2016-02-18 (×4): qty 1

## 2016-02-18 MED ORDER — HYDROXYZINE HCL 25 MG PO TABS: 25.0000 mg | ORAL_TABLET | Freq: Three times a day (TID) | ORAL | Status: DC | PRN

## 2016-02-18 MED ORDER — SIMVASTATIN 10 MG PO TABS
10.0000 mg | ORAL_TABLET | Freq: Every day | ORAL | Status: DC
  Administered 2016-02-18 – 2016-02-19 (×2): 10 mg via ORAL
  Filled 2016-02-18 (×2): qty 1

## 2016-02-18 MED ORDER — DEXAMETHASONE 4 MG PO TABS
8.0000 mg | ORAL_TABLET | Freq: Two times a day (BID) | ORAL | Status: DC
  Administered 2016-02-18 – 2016-02-22 (×8): 8 mg via ORAL
  Filled 2016-02-18 (×10): qty 2

## 2016-02-18 MED ORDER — HEPARIN (PORCINE) IN NACL 100-0.45 UNIT/ML-% IJ SOLN
750.0000 [IU]/h | INTRAMUSCULAR | Status: DC
  Administered 2016-02-18: 800 [IU]/h via INTRAVENOUS
  Administered 2016-02-19 – 2016-02-21 (×3): 750 [IU]/h via INTRAVENOUS
  Filled 2016-02-18 (×6): qty 250

## 2016-02-18 MED ORDER — LEVOTHYROXINE SODIUM 88 MCG PO TABS
88.0000 ug | ORAL_TABLET | Freq: Every day | ORAL | Status: DC
  Administered 2016-02-18 – 2016-02-22 (×5): 88 ug via ORAL
  Filled 2016-02-18 (×5): qty 1

## 2016-02-18 MED ORDER — IRBESARTAN 300 MG PO TABS
300.0000 mg | ORAL_TABLET | Freq: Every day | ORAL | Status: DC
  Administered 2016-02-18 – 2016-02-22 (×3): 300 mg via ORAL
  Filled 2016-02-18 (×6): qty 1

## 2016-02-18 MED ORDER — POTASSIUM CHLORIDE CRYS ER 10 MEQ PO TBCR: 10.0000 meq | EXTENDED_RELEASE_TABLET | Freq: Two times a day (BID) | ORAL | Status: DC | PRN

## 2016-02-18 MED ORDER — DICYCLOMINE HCL 10 MG PO CAPS
10.0000 mg | ORAL_CAPSULE | Freq: Every day | ORAL | Status: DC | PRN
  Administered 2016-02-19 – 2016-02-21 (×3): 10 mg via ORAL
  Filled 2016-02-18 (×3): qty 1

## 2016-02-18 MED ORDER — ACETAMINOPHEN 500 MG PO TABS
1000.0000 mg | ORAL_TABLET | Freq: Four times a day (QID) | ORAL | Status: DC | PRN
  Administered 2016-02-18 – 2016-02-22 (×3): 1000 mg via ORAL
  Filled 2016-02-18 (×3): qty 2

## 2016-02-18 MED ORDER — ZOLPIDEM TARTRATE 5 MG PO TABS
5.0000 mg | ORAL_TABLET | Freq: Every day | ORAL | Status: DC
  Administered 2016-02-18 – 2016-02-21 (×3): 5 mg via ORAL
  Filled 2016-02-18 (×4): qty 1

## 2016-02-18 MED ORDER — HEPARIN (PORCINE) IN NACL 100-0.45 UNIT/ML-% IJ SOLN
800.0000 [IU]/h | INTRAMUSCULAR | Status: DC
  Filled 2016-02-18: qty 250

## 2016-02-18 NOTE — ED Notes (Signed)
Family wants pt to sleep not to be disturbed for vital signs.

## 2016-02-18 NOTE — Progress Notes (Addendum)
Came to see patient but patient in for thora. D/w Dr Ovid Curd primary MD - reports ongoing hemoptysis with each cough approx 1 tsp of frank blood. Biopsy showing Small cell was `02/07/16 band has been  > 10 days butr she still has significant hemoptysis. Uanble to dc heparin /lovenox due to prosthetic valve  Plan  - she is at risk for dying from massive hemoptysis -r ecommend IR embolzation followed by palliative XRT ; both measures are palliative  - d/wDr Tamera Punt of IR - when patient at IR suite for thora no emoptysis - he prefers we get CTA chest and decide onbronch to localize site of bleed (currently presumed as bx site RUL) before going to do IR embolization . Will order CTA  - will see if I can come back in several hours to re-round on patient  Dr. Brand Males, M.D., [facility]C.P Pulmonary and Critical Care Medicine Staff Physician [place] [facility] Pager: [phone], If no answer or between  15:00h - 7:00h: call [phone]  02/18/2016 10:37 AM

## 2016-02-18 NOTE — ED Notes (Signed)
No respiratory or acute distress noted resting in bed with eyes closed call light in reach family at bedside family refused heparin till speaks with admitting doctor states she doesn't want to cause anymore bleeding till procedure done.

## 2016-02-18 NOTE — Progress Notes (Signed)
PROGRESS NOTE                                                                                                                                                                                                             Patient Demographics:    Deborah [name], is a 82 y.o. female, DOB - [birth year], SWF:[id number]  Admit date - 02/17/2016   Admitting Physician Etta Quill, DO  Outpatient Primary MD for the patient is Mathews Argyle, MD  LOS - 0  Outpatient Specialists: Oncology Dr Earlie Server,  Chief Complaint  Patient presents with  . Cough    up blood       Brief Narrative  Patient admitted earlier today by medically, chart, labs, imaging were reviewed 82 y.o. female with medical history significant of mechanical valve replacement on chronic lovenox, patient just had endobronchial biopsy for lung mass performed on 11/8 by Dr. Lamonte Sakai, this has come back showing SCLC of the lung, initial oncology visit 2 days ago with Dr. Julien Nordmann.  Patient presents to the ED with c/o SOB and hemoptysis   Subjective:    Deborah [name] today has, No headache, No chest pain, No abdominal pain -She reports dyspnea, and hemoptysis 2 this a.m..   Assessment  & Plan :    Principal Problem:   Hemoptysis Active Problems:   Pleural effusion on right   Small cell lung cancer, right (HCC)   Dyspnea   Hemoptysis - In the setting of SCLC , with recent biopsy on anticoagulation for mechanical valve - IR and [facility] M input greatly appreciated, possible need for embolization by IR, CTA chest obtained - Monitor CBC closely. - Old Lovenox, start on heparin GTT for anticoagulation, received evening dose, will start on heparin at 5 PM  Right pleural effusion - Status post thoracentesis this a.m., 7 50 mL drained, follow on cytology, prior thoracentesis without evidence of malignancy  SCLC - Recently diagnosed, very likely will need palliative radiation.  Mechanical  valve - On Lovenox, will start on heparin GTT   Code Status : DNR  Family Communication  : None at bedside  Disposition Plan  : home when stable  Consults  :  IR, PCCm  Procedures  : Thoracentesis 11/19  DVT Prophylaxis  :  Heparin GTT  Lab Results  Component Value Date   PLT 231  02/18/2016    Antibiotics  :    Anti-infectives    None        Objective:   Vitals:   02/18/16 0604 02/18/16 1015 02/18/16 1028 02/18/16 1039  BP: 125/72 125/66 120/61 105/61  Pulse: 79     Resp: 20     Temp: 97.7 F (36.5 C)     TempSrc: Oral     SpO2: 96%     Weight:      Height:        Wt Readings from Last 3 Encounters:  02/17/16 55.3 kg (122 lb)  02/16/16 55.3 kg (122 lb)  02/16/16 56 kg (123 lb 8 oz)     Intake/Output Summary (Last 24 hours) at 02/18/16 1251 Last data filed at 02/18/16 0900  Gross per 24 hour  Intake                0 ml  Output                3 ml  Net               -3 ml     Physical Exam  Awake Alert, Oriented X 3, No new F.N deficits, Normal affect [place].AT,PERRAL Supple Neck,No JVD, d.  Symmetrical Chest wall movement, diminished air entry bilaterally R>L, no wheezing RRR,No Gallops,Rubs , + valve click, No Parasternal Heave +ve B.Sounds, Abd Soft, No tenderness,  No Cyanosis, Clubbing or edema, No new Rash or bruise      Data Review:    CBC  Recent Labs Lab 02/12/16 0927 02/16/16 1212 02/17/16 2312 02/18/16 0640  WBC 4.7 6.4 9.2 6.3  HGB 10.6* 12.5 12.0 10.9*  HCT 33.1* 38.8 37.4 34.3*  PLT 243 268 286 231  MCV 100.9* 100.9 101.9* 103.0*  MCH 32.3 32.4 32.7 32.7  MCHC 32.0 32.1 32.1 31.8  RDW 17.8* 18.8* 16.5* 16.8*  LYMPHSABS 0.4* 0.7* 0.7  --   MONOABS 0.3 0.5 0.7  --   EOSABS 0.0 0.0 0.0  --   BASOSABS 0.0 0.0 0.0  --     Chemistries   Recent Labs Lab 02/12/16 0927 02/16/16 1212 02/17/16 2312 02/18/16 0640  NA 135 137 133* 134*  K 4.1 4.6 4.2 4.1  CL 96*  --  92* 92*  CO2 33* 31* 33* 36*  GLUCOSE 120* 86  110* 95  BUN 38* 29.4* 37* 38*  CREATININE 1.01* 0.8 0.88 0.79  CALCIUM 10.3 10.6* 10.0 9.5  AST 16 20  --   --   ALT 23 26  --   --   ALKPHOS 40 55  --   --   BILITOT 0.8 0.66  --   --    ------------------------------------------------------------------------------------------------------------------ No results for input(s): CHOL, HDL, LDLCALC, TRIG, CHOLHDL, LDLDIRECT in the last 72 hours.  Lab Results  Component Value Date   HGBA1C 5.4 12/26/2014   ------------------------------------------------------------------------------------------------------------------ No results for input(s): TSH, T4TOTAL, T3FREE, THYROIDAB in the last 72 hours.  Invalid input(s): FREET3 ------------------------------------------------------------------------------------------------------------------ No results for input(s): VITAMINB12, FOLATE, FERRITIN, TIBC, IRON, RETICCTPCT in the last 72 hours.  Coagulation profile No results for input(s): INR, PROTIME in the last 168 hours.  No results for input(s): DDIMER in the last 72 hours.  Cardiac Enzymes  Recent Labs Lab 02/12/16 0927  TROPONINI <0.03   ------------------------------------------------------------------------------------------------------------------    Component Value Date/Time   BNP 249.4 (H) 02/12/2016 0927   BNP 331.2 (H) 11/23/2015 1522    Inpatient Medications  Scheduled  Meds: . allopurinol  300 mg Oral QHS  . amLODipine  5 mg Oral QHS  . carvedilol  6.25 mg Oral BID WC  . cloNIDine  0.1 mg Oral Daily  . dexamethasone  8 mg Oral BID WC  . irbesartan  300 mg Oral Daily  . levothyroxine  88 mcg Oral QAC breakfast  . loratadine  10 mg Oral Daily  . simvastatin  10 mg Oral Daily  . sodium chloride      . zolpidem  5 mg Oral QHS   Continuous Infusions: . heparin     PRN Meds:.acetaminophen, celecoxib, dicyclomine, hydrOXYzine, iopamidol, LORazepam, potassium chloride SA, sodium chloride flush, traMADol  Micro  Results No results found for this or any previous visit (from the past 240 hour(s)).  Radiology Reports Dg Chest 1 View  Result Date: 02/18/2016 CLINICAL DATA:  Right thoracentesis. EXAM: CHEST 1 VIEW COMPARISON:  Chest x-ray from yesterday FINDINGS: Diminished right pleural effusion. No pneumothorax. Known right hilar mass and lower lobe atelectasis. Cardiopericardial enlargement. Porta catheter on the left with tip at a left SVC. IMPRESSION: 1. No acute finding after right thoracentesis. 2. Right basilar atelectasis and known right hilar mass. Electronically Signed   By: Monte Fantasia M.D.   On: 02/18/2016 11:12   Dg Chest 2 View  Result Date: 02/17/2016 CLINICAL DATA:  Status post lung biopsy. Shortness of breath and hemoptysis. EXAM: CHEST  2 VIEW COMPARISON:  Chest radiograph 02/12/2016 FINDINGS: Unchanged appearance of left chest wall Port-A-Cath with tip in the persistent left-sided SVC. There is aortic atherosclerosis. Median sternotomy wires and right axillary surgical clips are unchanged. Right pleural effusion has increased in size. The left lung is clear. No pneumothorax. No pulmonary edema. IMPRESSION: 1. Increased size of right pleural effusion with associated atelectasis. 2. Aortic atherosclerosis. Electronically Signed   By: Ulyses Jarred M.D.   On: 02/17/2016 22:51   Dg Chest 2 View  Result Date: 02/12/2016 CLINICAL DATA:  Hemoptysis.  Shortness of breath. EXAM: CHEST  2 VIEW COMPARISON:  01/11/2016 FINDINGS: Left Port-A-Cath is in place, unchanged with the tip in the persistent left-sided SVC. Prior median sternotomy. There is cardiomegaly. Increasing right perihilar and right basilar airspace disease. No confluent opacity on the left. Small right pleural effusion. IMPRESSION: Increasing right perihilar and right lower lobe airspace opacities concerning for pneumonia. Followup PA and lateral chest X-ray is recommended in 3-4 weeks following trial of antibiotic therapy to ensure  resolution and exclude underlying malignancy. Small right pleural effusion. Electronically Signed   By: Rolm Baptise M.D.   On: 02/12/2016 09:51   Ct Head W Wo Contrast  Result Date: 02/12/2016 CLINICAL DATA:  Metastatic lung cancer.  Staging. EXAM: CT HEAD WITHOUT AND WITH CONTRAST TECHNIQUE: Contiguous axial images were obtained from the base of the skull through the vertex without and with intravenous contrast CONTRAST:  76m ISOVUE-300 IOPAMIDOL (ISOVUE-300) INJECTION 61% COMPARISON:  CT head 12/26/2014 FINDINGS: Brain: Mild atrophy. Ventricle size within normal limits. Negative for acute infarct. Negative for hemorrhage or mass. Postcontrast imaging demonstrates normal enhancement. No enhancing mass lesion identified. Vascular: Normal arterial and venous enhancement. Skull: Negative Sinuses/Orbits: Mucosal thickening and opacification of the right sphenoid sinus. No orbital lesion. Other: None IMPRESSION: Negative for metastatic disease to the brain Right sphenoid sinus chronic opacification. Electronically Signed   By: CFranchot GalloM.D.   On: 02/12/2016 11:45   Ct Chest W Contrast  Result Date: 02/12/2016 CLINICAL DATA:  New diagnosis small cell lung cancer.  Hemoptysis. EXAM: CT CHEST WITH CONTRAST TECHNIQUE: Multidetector CT imaging of the chest was performed during intravenous contrast administration. CONTRAST:  33m ISOVUE-300 IOPAMIDOL (ISOVUE-300) INJECTION 61% COMPARISON:  01/10/2016 FINDINGS: Cardiovascular: Mild aneurysmal dilatation of the ascending thoracic aorta, 4.4 cm, stable since prior study. Mild cardiomegaly. Diffuse aortic calcifications. No dissection. There is a persistent left SVC noted. Port-A-Cath is in place with the tip in the persistent left SVC. Retroesophageal right subclavian artery again noted. Mediastinum/Nodes: Necrotic appearing right paratracheal lymph nodes, enlarged since prior study. Index node on image 50 measures 2.7 x 2.9 cm compared to 2.5 x 2.7 cm  previously. Right peritracheal adenopathy on image 41 measures 3.1 x 2.5 cm compared to 2.7 x 2.0 cm previously. Lungs/Pleura: Large right hilar soft tissue mass noted, enlarging since prior study, measuring 5.4 x 3.8 cm compared with 4.2 x 3.6 cm previously. This mass is partially necrotic. Moderate right pleural effusion is similar to prior study. No left effusion. Airspace disease noted in the right upper lobe likely reflects postobstructive pneumonitis. Compressive atelectasis noted in the right lower lobe. Upper Abdomen: 16 mm nodule in the left adrenal gland appearance likely new since prior CT abdomen 08/05/2015. Multiple renal and hepatic cysts are noted. Musculoskeletal: No visible focal bone lesion or acute bony abnormality. IMPRESSION: Enlarging right hilar mass with ground-glass opacities throughout the right upper lobe, likely postobstructive pneumonitis. Enlarging necrotic appearing mediastinal adenopathy. Stable moderate right pleural effusion. Cardiomegaly. Stable mild aneurysmal dilatation of the ascending thoracic aorta. Apparent new small nodule in the left adrenal gland. Cannot exclude metastasis. Electronically Signed   By: KRolm BaptiseM.D.   On: 02/12/2016 11:48   Nm Pet Image Initial (pi) Skull Base To Thigh  Result Date: 01/25/2016 CLINICAL DATA:  Initial treatment strategy for right upper lobe lung mass detected on recent chest CT angiogram performed in the setting of chest tightness and shortness of breath. History of breast cancer. EXAM: NUCLEAR MEDICINE PET SKULL BASE TO THIGH TECHNIQUE: 5.8 mCi F-18 FDG was injected intravenously. Full-ring PET imaging was performed from the skull base to thigh after the radiotracer. CT data was obtained and used for attenuation correction and anatomic localization. FASTING BLOOD GLUCOSE:  Value: 109 mg/dl COMPARISON:  01/10/2016 chest CT angiogram. 08/05/2015 CT abdomen/pelvis. FINDINGS: NECK No hypermetabolic lymph nodes in the neck. CHEST  Hypermetabolic central right upper lobe 5.1 x 3.7 cm lung mass with max SUV 15.4 (series 8/image 29), which encases and occludes the associated segmental right upper lobe bronchus, and which is confluent with hypermetabolic right hilar and right lower paratracheal adenopathy. Patchy ground-glass opacity in the anterior right upper lobe is most consistent with postobstructive pneumonitis. Separate sharply marginated focus of subpleural reticulation in the anterior right upper lobe is consistent with mild radiation fibrosis. Mild patchy tree-in-bud opacities in the anterior left upper lobe with associated low level metabolism (max SUV 2.7), stable since 01/10/2016, favor mild infectious or inflammatory bronchiolitis. No additional significant pulmonary nodules. Small layering right pleural effusion. No left pleural effusion. No pleural nodularity or hypermetabolism. Hypermetabolic right paratracheal adenopathy, for example a 2.7 cm right lower paratracheal node with max SUV 13.6 (series 4/image 50). No contralateral mediastinal or contralateral hilar hypermetabolic adenopathy. Surgical clips are again noted in the right axilla. No hypermetabolic axillary lymph nodes. Duplicated superior vena cava. Left internal jugular MediPort terminates within the lower third of the left superior vena cava. Moderate cardiomegaly. Left main, left anterior descending, left circumflex and right coronary atherosclerosis. Stable atherosclerotic aneurysmal ascending  thoracic aorta, maximum diameter 4.8 cm. Aberrant nonaneurysmal right subclavian artery arising from the distal aortic arch with retroesophageal course. Stable prominently dilated main pulmonary artery (4.7 cm diameter). ABDOMEN/PELVIS Left adrenal 1.7 cm hypermetabolic nodule with max SUV 12.0 (series 4/image 95), new since 08/05/2015. No right adrenal hypermetabolism or nodularity. Focus of hypermetabolism in the lateral left lower ventral abdominal muscle wall with  associated vague low-attenuation 1.4 cm lesion on the CT images with max SUV 7.1 (series 4/image 123). Focal hypermetabolism at the anorectal junction with max SUV 6.6, without discrete mass or definite wall thickening on the CT images. No abnormal hypermetabolic activity within the liver, pancreas, or spleen. No hypermetabolic lymph nodes in the abdomen or pelvis. Cholecystectomy. Atherosclerotic nonaneurysmal abdominal aorta. Grossly stable enlarged polycystic kidneys with scattered subcentimeter hyperdense renal cortical lesions throughout both kidneys, too small to characterize. Largest simple renal cyst measures 6.0 cm in the far lower right kidney and 7.4 cm in the lower left kidney. Hysterectomy. SKELETON No focal hypermetabolic activity to suggest skeletal metastasis. Sternotomy wires appear aligned and intact. IMPRESSION: 1. Hypermetabolic central right upper lobe 4.1 cm lung mass, which is confluent with hypermetabolic right hilar and right paratracheal adenopathy. Hypermetabolic left adrenal nodule. Favor stage IV (T2b N2 M1b) primary bronchogenic carcinoma. 2. Possible small soft tissue metastasis in the lateral left lower ventral abdominal muscle wall. 3. Nonspecific focal hypermetabolism at the anorectal junction without definite CT correlate, favor physiologic uptake, although a neoplasm cannot be excluded and clinical correlation is necessary. 4. Mild patchy tree-in-bud opacity in the anterior left upper lobe with associated low level metabolism, favor mild infectious or inflammatory bronchiolitis. 5. Postobstructive pneumonitis in the right upper lobe. 6. Cardiomegaly. Small layering right pleural effusion. No appreciable pleural nodularity or hypermetabolism . 7. Duplicated SVC. Aberrant right subclavian artery. Aortic atherosclerosis. Stable ascending thoracic aortic aneurysm, maximum diameter 4.8 cm. Stable prominently dilated main pulmonary artery suggesting chronic pulmonary arterial  hypertension. 8. Polycystic kidneys. Electronically Signed   By: Ilona Sorrel M.D.   On: 01/25/2016 16:00   Ct Angio Chest Aorta W/cm &/or Wo/cm  Result Date: 02/18/2016 CLINICAL DATA:  Hemoptysis. Mechanical bowel. Considering empiric bronchial embolization. EXAM: CT ANGIOGRAPHY CHEST WITH CONTRAST TECHNIQUE: Multidetector CT imaging of the chest was performed using the standard protocol during bolus administration of intravenous contrast. Multiplanar CT image reconstructions and MIPs were obtained to evaluate the vascular anatomy. CONTRAST:  100 cc Isovue 370 intravenous COMPARISON:  02/12/2016 FINDINGS: Cardiovascular: Chronic cardiomegaly. The atria are particularly enlarged. Mechanical aortic valve without para valvular collection. Aneurysmal ascending aorta measuring up to 49 mm diameter. Aberrant right subclavian artery with retroesophageal course. There is a persistent left SVC with indwelling porta catheter. Two bronchial arteries are seen and are not hyper trophic. The more proximal has its origin near the aberrant left subclavian artery ostium. No pseudoaneurysm or active hemorrhage seen within the patient's malignancy. Pulmonary hypertension. No identified acute pulmonary embolism Mediastinum/Nodes: Known right hilar malignancy with airway obstruction. Necrosis present in right paratracheal lymph nodes. Lungs/Pleura: Material cast the right lower lobe and middle lobe airways with right lower lobe collapse. In this setting, this may reflect hemorrhage. Clusters of micronodules in the right upper lobe and left upper lobe that are chronic. No left-sided source of hemorrhage identified. Mild interstitial coarsening and ground-glass opacity in the right upper lobe is likely from vascular/lymphatic obstruction Upper Abdomen: No acute finding. Polycystic kidneys. Known left adrenal mass that is hypermetabolic by CT. Musculoskeletal: L1-2 advanced disc disease  with sclerosis and subchondral cysts,  degenerative appearing. No acute or aggressive finding. Review of the MIP images confirms the above findings. IMPRESSION: 1. Two non hypertrophied bronchial arteries identified. 2. Known right upper lobe malignancy with airway obstruction and bronchial artery/vein narrowing. Material cast the right lower lobe and right middle lobe airways, new from 01/25/2016, presumably clot in this setting. The right lower lobe is completely collapsed. 3. Chronic mild bronchiolitis changes in the left upper lobe. No left-sided source of hemoptysis identified. 4. Ascending aortic aneurysm with aberrant right subclavian artery. Electronically Signed   By: Monte Fantasia M.D.   On: 02/18/2016 12:32   US Thoracentesis Asp Pleural Space W/img Guide  Result Date: 02/18/2016 INDICATION: Lung cancer. Shortness of breath. Right-sided pleural effusion. Request diagnostic and therapeutic thoracentesis. EXAM: ULTRASOUND GUIDED RIGHT THORACENTESIS MEDICATIONS: None. COMPLICATIONS: None immediate. Postprocedural chest x-ray negative for pneumothorax. PROCEDURE: An ultrasound guided thoracentesis was thoroughly discussed with the patient and questions answered. The benefits, risks, alternatives and complications were also discussed. The patient understands and wishes to proceed with the procedure. Written consent was obtained. Ultrasound was performed to localize and mark an adequate pocket of fluid in the right chest. The area was then prepped and draped in the normal sterile fashion. 1% Lidocaine was used for local anesthesia. Under ultrasound guidance a Safe-T-Centesis catheter was introduced. Thoracentesis was performed. The catheter was removed and a dressing applied. FINDINGS: A total of approximately 750 mL of clear yellow fluid was removed. Samples were sent to the laboratory as requested by the clinical team. IMPRESSION: Successful ultrasound guided right thoracentesis yielding 750 mL of pleural fluid. Read by: Ascencion Dike PA-C  Electronically Signed   By: Corrie Mckusick D.O.   On: 02/18/2016 11:00    Time Spent in minutes  No Sherlyn Lees, DAWOOD M.D on 02/18/2016 at 12:51 PM  Between 7am to 7pm - Pager - [phone]  After 7pm go to [email or web address] - password [facility]  [facility] -  Office  [phone]

## 2016-02-18 NOTE — Consult Note (Signed)
Chief Complaint: Hemoptysis  Referring Physician(s): Dr.  Waldron Labs, Dr. Chase Caller  History of Present Illness: Deborah [name] is a 82 y.o. female presenting to [facility] via the ED on 02/17/2016 for ongoing intermittent painless hemoptysis.    She has a recent new diagnosis of right lung CA, proven on biopsy to be small cell carcinoma. Biopsy was performed 02/07/2016 via endobronchial biopsy with Dr. Lamonte Sakai.  Biopsy was performed of  tissue within the lumen, a 4R node, and of hilar mass.    She has recently had consultation with Dr. Julien Nordmann, and she tells me her therapy is scheduled to begin 02/19/2016.    After her biopsy was performed, she tells me she had mild intermittent bloody sputum with cough, and she was evaluated in the ED 02/12/2016 for this complaint.  Given the mild severity, she was discharged from the ED with follow up instructions.    She tells me the hemoptysis was mild until Saturday 11/18, when it seemed to become more severe.  It was at this time that she came back for evaluation in the ED at [facility].  She tells me that that overnight the volume and frequency has become less.  She has had no associated syncope, SOB, dyspnea, CP.  She is comfortable.   She tells me the blood has been mixed bright/dark blood in her sputum.   She has a history of mechanical heart valve, and is on therapeutic anti-coagulation.  She takes BID lovenox for therapy, which she must continue for risk reduction.    A prior CT study was performed on 02/12/2016, which did not identify a specific site of hemorrhage.  There are small, non-hypertrophied bronchial arteries on this study.    Her daughters are with her on this admission, although are not present for our interview. She has just completed a right thoracentesis, which she tolerated well with no coughing/hemoptysis.   Past Medical History:  Diagnosis Date  . Acute CHF (congestive heart failure) ([place]) 01/10/2016  . Anemia   . Arthritis      "fingers, toes, hips; qwhere" (05/19/2013)  . Breast cancer ([place]) dx'd 1994   "right; S/P lumpectomy, chemo, XRT"   . Chronic lower GI bleeding   . Complication of anesthesia    "once I'm awakened, I don't sleep til the following night" BP drops very low had to stop putting in [place] -a- cath  . GERD (gastroesophageal reflux disease)   . Gout   . Heart murmur   . High cholesterol   . History of blood transfusion 1996; ~ 2013   "related to valve replacement; GI bleeding"   . History of breast cancer 02/16/2016  . History of echocardiogram    Echo 1/17: mild LVH, EF 50-55%, no RWMA, mild AI, mechanical AVR with mean 21 mmHg/peak 42 mmHg (stable since 2012), mod MR, mod to severe BAE, PASP 36 mmHg  . History of nuclear stress test    Myoview 1/17: EF 59%, normal perfusion, low risk  . Hypertension   . Hypothyroidism   . Lung cancer ([place]) dx'd 01/2016   SCL  . On supplemental oxygen therapy   . Permanent atrial fibrillation ([place])       . Pneumonia 07/2011  . Rectus sheath hematoma 12/19/14    held anticoagulation for 1 week, transfused  . Small cell lung cancer, right ([place]) 02/16/2016  . Stomach problems    seeing Dr Watt Climes  . TIA (transient ischemic attack) 12/24/14   symptoms resolved  Past Surgical History:  Procedure Laterality Date  . ABDOMINAL HYSTERECTOMY    . AORTIC VALVE REPLACEMENT  1996   [employer]  . APPENDECTOMY    . arteriogram  01/12   NO RAS   . BREAST BIOPSY Right   . BREAST LUMPECTOMY Right   . CARDIAC CATHETERIZATION    . CARDIOVERSION     X 2  . CATARACT EXTRACTION W/ INTRAOCULAR LENS  IMPLANT, BILATERAL Bilateral    bilateral cataract with lens implants  . CHOLECYSTECTOMY    . COLONOSCOPY WITH PROPOFOL N/A 11/24/2012   Procedure: COLONOSCOPY WITH PROPOFOL;  Surgeon: Jeryl Columbia, MD;  Location: [facility] ENDOSCOPY;  Service: Endoscopy;  Laterality: N/A;  . ESOPHAGOGASTRODUODENOSCOPY (EGD) WITH PROPOFOL N/A 07/25/2015   Procedure: ESOPHAGOGASTRODUODENOSCOPY  (EGD) WITH PROPOFOL;  Surgeon: Clarene Essex, MD;  Location: [facility] ENDOSCOPY;  Service: Endoscopy;  Laterality: N/A;  . HOT HEMOSTASIS N/A 07/25/2015   Procedure: HOT HEMOSTASIS (ARGON PLASMA COAGULATION/BICAP);  Surgeon: Clarene Essex, MD;  Location: [facility] ENDOSCOPY;  Service: Endoscopy;  Laterality: N/A;  . VIDEO BRONCHOSCOPY WITH ENDOBRONCHIAL ULTRASOUND N/A 02/07/2016   Procedure: VIDEO BRONCHOSCOPY WITH ENDOBRONCHIAL ULTRASOUND;  Surgeon: Collene Gobble, MD;  Location: [facility] OR;  Service: Thoracic;  Laterality: N/A;    Allergies: Alendronate sodium; Amiodarone; Chlorthalidone; Macrodantin [nitrofurantoin]; Meclizine; Norvasc [amlodipine besylate]; Hydralazine hcl; Sulfa antibiotics; and Tussionex pennkinetic er [hydrocod polst-cpm polst er]  Medications: Prior to Admission medications   Medication Sig Start Date End Date Taking? Authorizing Provider  acetaminophen (TYLENOL) 500 MG tablet Take 1,000 mg by mouth every 6 (six) hours as needed for mild pain, moderate pain or headache. Reported on 09/26/2015   Yes Historical Provider, MD  allopurinol (ZYLOPRIM) 300 MG tablet Take 300 mg by mouth at bedtime.    Yes Historical Provider, MD  amLODipine (NORVASC) 5 MG tablet Take 5 mg by mouth at bedtime.   Yes Historical Provider, MD  calcium-vitamin D 500 MG tablet Take 1 tablet by mouth 2 (two) times daily. Reported on 09/26/2015   Yes Historical Provider, MD  carvedilol (COREG) 6.25 MG tablet TAKE 6.25 MG BY MOUTH TWICE DAILY 02/07/16  Yes Collene Gobble, MD  celecoxib (CELEBREX) 200 MG capsule Take 200 mg by mouth daily as needed for moderate pain.  12/07/14  Yes Historical Provider, MD  cloNIDine (CATAPRES) 0.1 MG tablet Take 0.1 mg by mouth daily.    Yes Historical Provider, MD  dexamethasone (DECADRON) 4 MG tablet Take 2 tablets (8 mg total) by mouth 2 (two) times daily with a meal. 02/09/16  Yes [name], MD  dicyclomine (BENTYL) 10 MG capsule Take 10 mg by mouth daily as needed for spasms.    Yes  Historical Provider, MD  enoxaparin (LOVENOX) 60 MG/0.6ML injection INJECT 0.52m IN TO SKIN EVERY 12 HOURS 02/05/16  Yes GChauncey Cruel MD  fexofenadine ([facility]) 180 MG tablet Take 180 mg by mouth daily.    Yes Historical Provider, MD  furosemide (LASIX) 40 MG tablet Take 1-2 tablets daily as directed. Patient taking differently: Take 40 mg by mouth 2 (two) times daily as needed for fluid.  12/28/15  Yes Rhonda G Barrett, PA-C  hydrOXYzine (ATARAX/VISTARIL) 25 MG tablet Take 25 mg by mouth 3 (three) times daily as needed for anxiety.   Yes Historical Provider, MD  levothyroxine (SYNTHROID, LEVOTHROID) 88 MCG tablet Take 1 tablet (88 mcg total) by mouth daily before breakfast. 10/10/15  Yes TSkeet Latch MD  LORazepam (ATIVAN) 0.5 MG tablet Take 1  tablet (0.5 mg total) by mouth every 8 (eight) hours as needed for anxiety. 02/16/16  Yes Curt Bears, MD  NITROSTAT 0.4 MG SL tablet Place 1 tablet (0.4 mg total) under the tongue every 5 (five) minutes as needed for chest pain (x 3 doses). Reported on 10/17/2015 02/15/16  Yes Skeet Latch, MD  OXYGEN Inhale 2 L into the lungs continuous.   Yes Historical Provider, MD  potassium chloride SA (K-DUR,KLOR-CON) 20 MEQ tablet Take 0.5 tablets (10 mEq total) by mouth 2 (two) times daily as needed (taking with Lasix). 02/07/16  Yes Collene Gobble, MD  Probiotic Product (PROBIOTIC ADVANCED PO) Take 1 tablet by mouth daily. Reported on 10/17/2015   Yes Historical Provider, MD  simvastatin (ZOCOR) 20 MG tablet Take 10 mg by mouth daily.    Yes Historical Provider, MD  traMADol (ULTRAM) 50 MG tablet Take 1 tablet (50 mg total) by mouth every 6 (six) hours as needed. Patient taking differently: Take 50 mg by mouth every 6 (six) hours as needed for moderate pain or severe pain.  02/16/16  Yes Almyra Deforest, PA  valsartan (DIOVAN) 320 MG tablet Take 1 tablet (320 mg total) by mouth daily. 09/07/15  Yes Skeet Latch, MD  zolpidem (AMBIEN) 5 MG tablet Take  5 mg by mouth at bedtime.    Yes Historical Provider, MD     Family History  Problem Relation Age of Onset  . Dementia Mother   . Coronary artery disease Father   . Heart attack Father   . Breast cancer Daughter   . Colon cancer Sister   . Liver cancer Sister   . Lung cancer Brother   . COPD Brother     Social History   Social History  . Marital status: Widowed    Spouse name: N/A  . Number of children: N/A  . Years of education: N/A   Social History Main Topics  . Smoking status: Former Smoker    Packs/day: 1.00    Years: 20.00    Types: Cigarettes    Quit date: 04/01/1978  . Smokeless tobacco: Never Used  . Alcohol use 4.8 oz/week    4 Glasses of wine, 4 Shots of liquor per week     Comment: burbon or glass wine daily  . Drug use: No  . Sexual activity: No   Other Topics Concern  . None   Social History Narrative  . None    Review of Systems: A 12 point ROS discussed and pertinent positives are indicated in the HPI above.  All other systems are negative.  Review of Systems  Vital Signs: BP 105/61 (BP Location: Left Arm)   Pulse 79   Temp 97.7 F (36.5 C) (Oral)   Resp 20   Ht '5\' 4"'$  (1.626 m)   Wt 122 lb (55.3 kg)   SpO2 96%   BMI 20.94 kg/m   Physical Exam  Mallampati Score:     Imaging: Dg Chest 1 View  Result Date: 02/18/2016 CLINICAL DATA:  Right thoracentesis. EXAM: CHEST 1 VIEW COMPARISON:  Chest x-ray from yesterday FINDINGS: Diminished right pleural effusion. No pneumothorax. Known right hilar mass and lower lobe atelectasis. Cardiopericardial enlargement. Porta catheter on the left with tip at a left SVC. IMPRESSION: 1. No acute finding after right thoracentesis. 2. Right basilar atelectasis and known right hilar mass. Electronically Signed   By: Monte Fantasia M.D.   On: 02/18/2016 11:12   Dg Chest 2 View  Result Date: 02/17/2016 CLINICAL  DATA:  Status post lung biopsy. Shortness of breath and hemoptysis. EXAM: CHEST  2 VIEW  COMPARISON:  Chest radiograph 02/12/2016 FINDINGS: Unchanged appearance of left chest wall Port-A-Cath with tip in the persistent left-sided SVC. There is aortic atherosclerosis. Median sternotomy wires and right axillary surgical clips are unchanged. Right pleural effusion has increased in size. The left lung is clear. No pneumothorax. No pulmonary edema. IMPRESSION: 1. Increased size of right pleural effusion with associated atelectasis. 2. Aortic atherosclerosis. Electronically Signed   By: Ulyses Jarred M.D.   On: 02/17/2016 22:51   Dg Chest 2 View  Result Date: 02/12/2016 CLINICAL DATA:  Hemoptysis.  Shortness of breath. EXAM: CHEST  2 VIEW COMPARISON:  01/11/2016 FINDINGS: Left Port-A-Cath is in place, unchanged with the tip in the persistent left-sided SVC. Prior median sternotomy. There is cardiomegaly. Increasing right perihilar and right basilar airspace disease. No confluent opacity on the left. Small right pleural effusion. IMPRESSION: Increasing right perihilar and right lower lobe airspace opacities concerning for pneumonia. Followup PA and lateral chest X-ray is recommended in 3-4 weeks following trial of antibiotic therapy to ensure resolution and exclude underlying malignancy. Small right pleural effusion. Electronically Signed   By: Rolm Baptise M.D.   On: 02/12/2016 09:51   Ct Head W Wo Contrast  Result Date: 02/12/2016 CLINICAL DATA:  Metastatic lung cancer.  Staging. EXAM: CT HEAD WITHOUT AND WITH CONTRAST TECHNIQUE: Contiguous axial images were obtained from the base of the skull through the vertex without and with intravenous contrast CONTRAST:  42m ISOVUE-300 IOPAMIDOL (ISOVUE-300) INJECTION 61% COMPARISON:  CT head 12/26/2014 FINDINGS: Brain: Mild atrophy. Ventricle size within normal limits. Negative for acute infarct. Negative for hemorrhage or mass. Postcontrast imaging demonstrates normal enhancement. No enhancing mass lesion identified. Vascular: Normal arterial and venous  enhancement. Skull: Negative Sinuses/Orbits: Mucosal thickening and opacification of the right sphenoid sinus. No orbital lesion. Other: None IMPRESSION: Negative for metastatic disease to the brain Right sphenoid sinus chronic opacification. Electronically Signed   By: CFranchot GalloM.D.   On: 02/12/2016 11:45   Ct Chest W Contrast  Result Date: 02/12/2016 CLINICAL DATA:  New diagnosis small cell lung cancer.  Hemoptysis. EXAM: CT CHEST WITH CONTRAST TECHNIQUE: Multidetector CT imaging of the chest was performed during intravenous contrast administration. CONTRAST:  747mISOVUE-300 IOPAMIDOL (ISOVUE-300) INJECTION 61% COMPARISON:  01/10/2016 FINDINGS: Cardiovascular: Mild aneurysmal dilatation of the ascending thoracic aorta, 4.4 cm, stable since prior study. Mild cardiomegaly. Diffuse aortic calcifications. No dissection. There is a persistent left SVC noted. Port-A-Cath is in place with the tip in the persistent left SVC. Retroesophageal right subclavian artery again noted. Mediastinum/Nodes: Necrotic appearing right paratracheal lymph nodes, enlarged since prior study. Index node on image 50 measures 2.7 x 2.9 cm compared to 2.5 x 2.7 cm previously. Right peritracheal adenopathy on image 41 measures 3.1 x 2.5 cm compared to 2.7 x 2.0 cm previously. Lungs/Pleura: Large right hilar soft tissue mass noted, enlarging since prior study, measuring 5.4 x 3.8 cm compared with 4.2 x 3.6 cm previously. This mass is partially necrotic. Moderate right pleural effusion is similar to prior study. No left effusion. Airspace disease noted in the right upper lobe likely reflects postobstructive pneumonitis. Compressive atelectasis noted in the right lower lobe. Upper Abdomen: 16 mm nodule in the left adrenal gland appearance likely new since prior CT abdomen 08/05/2015. Multiple renal and hepatic cysts are noted. Musculoskeletal: No visible focal bone lesion or acute bony abnormality. IMPRESSION: Enlarging right hilar mass  with  ground-glass opacities throughout the right upper lobe, likely postobstructive pneumonitis. Enlarging necrotic appearing mediastinal adenopathy. Stable moderate right pleural effusion. Cardiomegaly. Stable mild aneurysmal dilatation of the ascending thoracic aorta. Apparent new small nodule in the left adrenal gland. Cannot exclude metastasis. Electronically Signed   By: Rolm Baptise M.D.   On: 02/12/2016 11:48   Nm Pet Image Initial (pi) Skull Base To Thigh  Result Date: 01/25/2016 CLINICAL DATA:  Initial treatment strategy for right upper lobe lung mass detected on recent chest CT angiogram performed in the setting of chest tightness and shortness of breath. History of breast cancer. EXAM: NUCLEAR MEDICINE PET SKULL BASE TO THIGH TECHNIQUE: 5.8 mCi F-18 FDG was injected intravenously. Full-ring PET imaging was performed from the skull base to thigh after the radiotracer. CT data was obtained and used for attenuation correction and anatomic localization. FASTING BLOOD GLUCOSE:  Value: 109 mg/dl COMPARISON:  01/10/2016 chest CT angiogram. 08/05/2015 CT abdomen/pelvis. FINDINGS: NECK No hypermetabolic lymph nodes in the neck. CHEST Hypermetabolic central right upper lobe 5.1 x 3.7 cm lung mass with max SUV 15.4 (series 8/image 29), which encases and occludes the associated segmental right upper lobe bronchus, and which is confluent with hypermetabolic right hilar and right lower paratracheal adenopathy. Patchy ground-glass opacity in the anterior right upper lobe is most consistent with postobstructive pneumonitis. Separate sharply marginated focus of subpleural reticulation in the anterior right upper lobe is consistent with mild radiation fibrosis. Mild patchy tree-in-bud opacities in the anterior left upper lobe with associated low level metabolism (max SUV 2.7), stable since 01/10/2016, favor mild infectious or inflammatory bronchiolitis. No additional significant pulmonary nodules. Small layering right  pleural effusion. No left pleural effusion. No pleural nodularity or hypermetabolism. Hypermetabolic right paratracheal adenopathy, for example a 2.7 cm right lower paratracheal node with max SUV 13.6 (series 4/image 50). No contralateral mediastinal or contralateral hilar hypermetabolic adenopathy. Surgical clips are again noted in the right axilla. No hypermetabolic axillary lymph nodes. Duplicated superior vena cava. Left internal jugular MediPort terminates within the lower third of the left superior vena cava. Moderate cardiomegaly. Left main, left anterior descending, left circumflex and right coronary atherosclerosis. Stable atherosclerotic aneurysmal ascending thoracic aorta, maximum diameter 4.8 cm. Aberrant nonaneurysmal right subclavian artery arising from the distal aortic arch with retroesophageal course. Stable prominently dilated main pulmonary artery (4.7 cm diameter). ABDOMEN/PELVIS Left adrenal 1.7 cm hypermetabolic nodule with max SUV 12.0 (series 4/image 95), new since 08/05/2015. No right adrenal hypermetabolism or nodularity. Focus of hypermetabolism in the lateral left lower ventral abdominal muscle wall with associated vague low-attenuation 1.4 cm lesion on the CT images with max SUV 7.1 (series 4/image 123). Focal hypermetabolism at the anorectal junction with max SUV 6.6, without discrete mass or definite wall thickening on the CT images. No abnormal hypermetabolic activity within the liver, pancreas, or spleen. No hypermetabolic lymph nodes in the abdomen or pelvis. Cholecystectomy. Atherosclerotic nonaneurysmal abdominal aorta. Grossly stable enlarged polycystic kidneys with scattered subcentimeter hyperdense renal cortical lesions throughout both kidneys, too small to characterize. Largest simple renal cyst measures 6.0 cm in the far lower right kidney and 7.4 cm in the lower left kidney. Hysterectomy. SKELETON No focal hypermetabolic activity to suggest skeletal metastasis. Sternotomy  wires appear aligned and intact. IMPRESSION: 1. Hypermetabolic central right upper lobe 4.1 cm lung mass, which is confluent with hypermetabolic right hilar and right paratracheal adenopathy. Hypermetabolic left adrenal nodule. Favor stage IV (T2b N2 M1b) primary bronchogenic carcinoma. 2. Possible small soft tissue metastasis in the  lateral left lower ventral abdominal muscle wall. 3. Nonspecific focal hypermetabolism at the anorectal junction without definite CT correlate, favor physiologic uptake, although a neoplasm cannot be excluded and clinical correlation is necessary. 4. Mild patchy tree-in-bud opacity in the anterior left upper lobe with associated low level metabolism, favor mild infectious or inflammatory bronchiolitis. 5. Postobstructive pneumonitis in the right upper lobe. 6. Cardiomegaly. Small layering right pleural effusion. No appreciable pleural nodularity or hypermetabolism . 7. Duplicated SVC. Aberrant right subclavian artery. Aortic atherosclerosis. Stable ascending thoracic aortic aneurysm, maximum diameter 4.8 cm. Stable prominently dilated main pulmonary artery suggesting chronic pulmonary arterial hypertension. 8. Polycystic kidneys. Electronically Signed   By: Ilona Sorrel M.D.   On: 01/25/2016 16:00   US Thoracentesis Asp Pleural Space W/img Guide  Result Date: 02/18/2016 INDICATION: Lung cancer. Shortness of breath. Right-sided pleural effusion. Request diagnostic and therapeutic thoracentesis. EXAM: ULTRASOUND GUIDED RIGHT THORACENTESIS MEDICATIONS: None. COMPLICATIONS: None immediate. Postprocedural chest x-ray negative for pneumothorax. PROCEDURE: An ultrasound guided thoracentesis was thoroughly discussed with the patient and questions answered. The benefits, risks, alternatives and complications were also discussed. The patient understands and wishes to proceed with the procedure. Written consent was obtained. Ultrasound was performed to localize and mark an adequate pocket of  fluid in the right chest. The area was then prepped and draped in the normal sterile fashion. 1% Lidocaine was used for local anesthesia. Under ultrasound guidance a Safe-T-Centesis catheter was introduced. Thoracentesis was performed. The catheter was removed and a dressing applied. FINDINGS: A total of approximately 750 mL of clear yellow fluid was removed. Samples were sent to the laboratory as requested by the clinical team. IMPRESSION: Successful ultrasound guided right thoracentesis yielding 750 mL of pleural fluid. Read by: Ascencion Dike PA-C Electronically Signed   By: Corrie Mckusick D.O.   On: 02/18/2016 11:00    Labs:  CBC:  Recent Labs  02/12/16 0927 02/16/16 1212 02/17/16 2312 02/18/16 0640  WBC 4.7 6.4 9.2 6.3  HGB 10.6* 12.5 12.0 10.9*  HCT 33.1* 38.8 37.4 34.3*  PLT 243 268 286 231    COAGS:  Recent Labs  01/10/16 1021 01/10/16 1337 01/11/16 1010 02/07/16 0907  INR 2.60 2.02 1.49 0.96  APTT  --   --   --  50*    BMP:  Recent Labs  02/05/16 1535 02/12/16 0927 02/16/16 1212 02/17/16 2312 02/18/16 0640  NA 132* 135 137 133* 134*  K 4.5 4.1 4.6 4.2 4.1  CL 96* 96*  --  92* 92*  CO2 28 33* 31* 33* 36*  GLUCOSE 113* 120* 86 110* 95  BUN 41* 38* 29.4* 37* 38*  CALCIUM 9.7 10.3 10.6* 10.0 9.5  CREATININE 1.16* 1.01* 0.8 0.88 0.79  GFRNONAA 41* 49*  --  57* >60  GFRAA 48* 56*  --  >60 >60    LIVER FUNCTION TESTS:  Recent Labs  01/15/16 1108 02/05/16 1535 02/12/16 0927 02/16/16 1212  BILITOT 0.30 0.4 0.8 0.66  AST '15 24 16 20  '$ ALT '19 22 23 26  '$ ALKPHOS 37* 40 40 55  PROT 5.0* 5.2* 5.9* 6.4  ALBUMIN 2.9* 3.5 3.6 3.6    TUMOR MARKERS: No results for input(s): AFPTM, CEA, CA199, CHROMGRNA in the last 8760 hours.  Assessment and Plan:  Deborah [name] is an 82 year old female with ongoing intermittent, low-volume, painless hemoptysis, which started after recent endobronchial biopsy, confirming SCCA.    The source is most likely from the tumor or  the biopsy site, either  from the tissue within the lumen or from the site of bronchial transgression.  At this time, a repeat bronchoscopy has not been performed.    Typically, a low volume bleed such as this, most likely from venous source, would stop with discontinuing the anti-coagulation, however, she cannot stop the [facility] given her mechanical valve.  The prior CT did not identify a source, and the bronchial arteries are quite small.    I discussed briefly the indications of bronchial artery angiogram and possible embolization with Deborah [name], including the risk/benefit profile of such a procedure.  I shared that I believe a conservative approach at this time may see the bleeding resolve -- particularly with her start of CA treatment tomorrow. Also, at this time she does not seem keen to proceed with intervention.    My impression is that the angiogram/embo would be difficult, as there is no evidence of bronchial artery hypertrophy.  Also, there is good chance that the bleed is venous.  Treating the bronchial arteries leading to the tumor would be indirect treatment, and might not, even if successfully performed, treat the site of hemorrhage.  Should we need to proceed with angiogram, we can review a full risk profile and informed consent with her and her daughters.    Plan: 1 - agree with current inpt management, including the transition from lovenox to heparin IV.  2 - CTA chest to investigate for specific arterial source, possible pseudoaneurysm, etc.  3 - VIR will follow.   Bleeding may improve despite the [facility] once the oncologic therapy starts, as bronchial artery embo is felt to be indirect treatment to the presumed venous bleed site  4 - Should urgent/emergent or elective angiogram and possible embolization be required, informed consent discussion will be required.   Thank you for this interesting consult.  I greatly enjoyed meeting Deborah [name] and look forward to participating in their  care.  A copy of this report was sent to the requesting provider on this date.  Electronically Signed: Corrie Mckusick 02/18/2016, 11:36 AM   I spent a total of 55 Mi[place]utes    in face to face in clinical consultation, greater than 50% of which was counseling/coordinating care for hemoptysis,

## 2016-02-18 NOTE — ED Notes (Signed)
No respiratory or acute distress noted alert and oriented x 3 call light in reach family at bedside water given.

## 2016-02-18 NOTE — Progress Notes (Signed)
Patients daughter did not feel comfortable with starting a heparin drip considering she had already received her lovenox tonight. Daughter was also under the impression that the heparin drip was ordered if the thoracentesis was going to take place, but she said that now it wasn't and therefore she didn't want it started.

## 2016-02-18 NOTE — ED Provider Notes (Signed)
[facility] DEPT Provider Note   CSN: [id number] Arrival date & time: 02/17/16  2150     History   Chief Complaint Chief Complaint  Patient presents with  . Cough    up blood    HPI Deborah [name] is a 82 y.o. female.  HPI   82 year old female with history of recently diagnosed small cell lung cancer with biopsy obtained November 8 by Dr. Kyung Rudd, presentation to the emergency department on November 13 with hemoptysis, with history of atrial fib and mechanical heart valve on Lovenox, pt on home O2, presents concern for worsening hemoptysis.    Bleeding began after biopsy, continued after presentation 11/13 and now has worsened to 1 tablespoon approx every 74mn, getting worse. Called pulm and told to come to ED.  Coughing is worse, no other sputum. Dyspnea slightly worse. Feeling fatigued from so much coughing.   Past Medical History:  Diagnosis Date  . Acute CHF (congestive heart failure) ([place] 01/10/2016  . Anemia   . Arthritis    "fingers, toes, hips; qwhere" (05/19/2013)  . Breast cancer ([facility] 1994   "right; S/P lumpectomy, chemo, XRT"   . Chronic lower GI bleeding   . Complication of anesthesia    "once I'm awakened, I don't sleep til the following night" BP drops very low had to stop putting in PPiney-a- cath  . GERD (gastroesophageal reflux disease)   . Gout   . Heart murmur   . High cholesterol   . History of blood transfusion 1996; ~ 2013   "related to valve replacement; GI bleeding"   . History of breast cancer 02/16/2016  . History of echocardiogram    Echo 1/17: mild LVH, EF 50-55%, no RWMA, mild AI, mechanical AVR with mean 21 mmHg/peak 42 mmHg (stable since 2012), mod MR, mod to severe BAE, PASP 36 mmHg  . History of nuclear stress test    Myoview 1/17: EF 59%, normal perfusion, low risk  . Hypertension   . Hypothyroidism   . Lung cancer ([facility] dx[place] 01/2016   SCL  . On supplemental oxygen therapy   . Permanent atrial fibrillation ([facility]       .  Pneumonia 07/2011  . Rectus sheath hematoma 12/19/14    held anticoagulation for 1 week, transfused  . Small cell lung cancer, right ([place] 02/16/2016  . Stomach problems    seeing Dr MWatt Climes . TIA (transient ischemic attack) 12/24/14   symptoms resolved    Patient Active Problem List   Diagnosis Date Noted  . Dyspnea 02/18/2016  . Small cell lung cancer, right ([facility] 02/16/2016  . Chest pain 02/16/2016  . History of breast cancer 02/16/2016  . Hemoptysis   . Mediastinal lymphadenopathy   . Lung cancer, primary, with metastasis from lung to other site, right ([place] 02/06/2016  . Ascending aortic aneurysm ([place] 01/24/2016  . Pleural effusion on right 01/11/2016  . AVM (arteriovenous malformation) 01/11/2016  . Acute respiratory failure with hypoxia ([facility] 01/11/2016  . S/P thoracentesis   . Shortness of breath 01/10/2016  . Acute CHF (congestive heart failure) ([facility] 01/10/2016  . Lung mass   . Chronic GI bleeding 12/27/2014  . Cerebral thrombosis with cerebral infarction ([place] 12/26/2014  . TIA (transient ischemic attack)   . Dysarthria 12/25/2014  . HLD (hyperlipidemia) 12/25/2014  . Essential hypertension 12/25/2014  . Gout 12/25/2014  . GERD (gastroesophageal reflux disease) 12/25/2014  . Normocytic anemia due to blood loss   . Rectus sheath hematoma 12/19/2014  .  Acute blood loss anemia 12/19/2014  . Hematoma 12/19/2014  . Pain 06/14/2014  . Poor venous access   . Melena 05/19/2013  . Coagulopathy ([place]) 01/27/2013  . Iron deficiency anemia due to chronic blood loss 07/24/2012  . Malaise and fatigue 05/21/2012  . Dyslipidemia 12/04/2011  . Chronic atrial fibrillation ([place]) 09/06/2011  . Long term current use of anticoagulant therapy 08/08/2011  . Nonsustained ventricular tachycardia ([place]) 07/21/2011  . Hypokalemia 07/20/2011  . Hyponatremia 07/20/2011  . Bronchitis 07/20/2011  . S/P aortic valve replacement with metallic valve 91/47/8295  . Hypothyroidism   . Benign  hypertensive heart disease without heart failure     Past Surgical History:  Procedure Laterality Date  . ABDOMINAL HYSTERECTOMY    . AORTIC VALVE REPLACEMENT  1996   [employer]  . APPENDECTOMY    . arteriogram  01/12   NO RAS   . BREAST BIOPSY Right   . BREAST LUMPECTOMY Right   . CARDIAC CATHETERIZATION    . CARDIOVERSION     X 2  . CATARACT EXTRACTION W/ INTRAOCULAR LENS  IMPLANT, BILATERAL Bilateral    bilateral cataract with lens implants  . CHOLECYSTECTOMY    . COLONOSCOPY WITH PROPOFOL N/A 11/24/2012   Procedure: COLONOSCOPY WITH PROPOFOL;  Surgeon: Jeryl Columbia, MD;  Location: [facility] ENDOSCOPY;  Service: Endoscopy;  Laterality: N/A;  . ESOPHAGOGASTRODUODENOSCOPY (EGD) WITH PROPOFOL N/A 07/25/2015   Procedure: ESOPHAGOGASTRODUODENOSCOPY (EGD) WITH PROPOFOL;  Surgeon: Clarene Essex, MD;  Location: [facility] ENDOSCOPY;  Service: Endoscopy;  Laterality: N/A;  . HOT HEMOSTASIS N/A 07/25/2015   Procedure: HOT HEMOSTASIS (ARGON PLASMA COAGULATION/BICAP);  Surgeon: Clarene Essex, MD;  Location[facility] ENDOSCOPY;  Service: Endoscopy;  Laterality: N/A;  . VIDEO BRONCHOSCOPY WITH ENDOBRONCHIAL ULTRASOUND N/A 02/07/2016   Procedure: VIDEO BRONCHOSCOPY WITH ENDOBRONCHIAL ULTRASOUND;  Surgeon: Collene Gobble, MD;  Location: [facility];  Service: Thoracic;  Laterality: N/A;    OB History    No data available       Home Medications    Prior to Admission medications   Medication Sig Start Date End Date Taking? Authorizing Provider  acetaminophen (TYLENOL) 500 MG tablet Take 1,000 mg by mouth every 6 (six) hours as needed for mild pain, moderate pain or headache. Reported on 09/26/2015   Yes Historical Provider, MD  allopurinol (ZYLOPRIM) 300 MG tablet Take 300 mg by mouth at bedtime.    Yes Historical Provider, MD  amLODipine (NORVASC) 5 MG tablet Take 5 mg by mouth at bedtime.   Yes Historical Provider, MD  calcium-vitamin D 500 MG tablet Take 1 tablet by mouth 2 (two) times daily. Reported on 09/26/2015   Yes  Historical Provider, MD  carvedilol (COREG) 6.25 MG tablet TAKE 6.25 MG BY MOUTH TWICE DAILY 02/07/16  Yes Collene Gobble, MD  celecoxib (CELEBREX) 200 MG capsule Take 200 mg by mouth daily as needed for moderate pain.  12/07/14  Yes Historical Provider, MD  cloNIDine (CATAPRES) 0.1 MG tablet Take 0.1 mg by mouth daily.    Yes Historical Provider, MD  dexamethasone (DECADRON) 4 MG tablet Take 2 tablets (8 mg total) by mouth 2 (two) times daily with a meal. 02/09/16  Yes [name], MD  dicyclomine (BENTYL) 10 MG capsule Take 10 mg by mouth daily as needed for spasms.    Yes Historical Provider, MD  enoxaparin (LOVENOX) 60 MG/0.6ML injection INJECT 0.16m IN TO SKIN EVERY 12 HOURS 02/05/16  Yes GChauncey Cruel MD  fexofenadine ([facility]) 180 MG tablet  Take 180 mg by mouth daily.    Yes Historical Provider, MD  furosemide (LASIX) 40 MG tablet Take 1-2 tablets daily as directed. Patient taking differently: Take 40 mg by mouth 2 (two) times daily as needed for fluid.  12/28/15  Yes Rhonda G Barrett, PA-C  hydrOXYzine (ATARAX/VISTARIL) 25 MG tablet Take 25 mg by mouth 3 (three) times daily as needed for anxiety.   Yes Historical Provider, MD  levothyroxine (SYNTHROID, LEVOTHROID) 88 MCG tablet Take 1 tablet (88 mcg total) by mouth daily before breakfast. 10/10/15  Yes Skeet Latch, MD  LORazepam (ATIVAN) 0.5 MG tablet Take 1 tablet (0.5 mg total) by mouth every 8 (eight) hours as needed for anxiety. 02/16/16  Yes Curt Bears, MD  NITROSTAT 0.4 MG SL tablet Place 1 tablet (0.4 mg total) under the tongue every 5 (five) minutes as needed for chest pain (x 3 doses). Reported on 10/17/2015 02/15/16  Yes Skeet Latch, MD  OXYGEN Inhale 2 L into the lungs continuous.   Yes Historical Provider, MD  potassium chloride SA (K-DUR,KLOR-CON) 20 MEQ tablet Take 0.5 tablets (10 mEq total) by mouth 2 (two) times daily as needed (taking with Lasix). 02/07/16  Yes Collene Gobble, MD  Probiotic Product  (PROBIOTIC ADVANCED PO) Take 1 tablet by mouth daily. Reported on 10/17/2015   Yes Historical Provider, MD  simvastatin (ZOCOR) 20 MG tablet Take 10 mg by mouth daily.    Yes Historical Provider, MD  traMADol (ULTRAM) 50 MG tablet Take 1 tablet (50 mg total) by mouth every 6 (six) hours as needed. Patient taking differently: Take 50 mg by mouth every 6 (six) hours as needed for moderate pain or severe pain.  02/16/16  Yes Almyra Deforest, PA  valsartan (DIOVAN) 320 MG tablet Take 1 tablet (320 mg total) by mouth daily. 09/07/15  Yes Skeet Latch, MD  zolpidem (AMBIEN) 5 MG tablet Take 5 mg by mouth at bedtime.    Yes Historical Provider, MD    Family History Family History  Problem Relation Age of Onset  . Dementia Mother   . Coronary artery disease Father   . Heart attack Father   . Breast cancer Daughter   . Colon cancer Sister   . Liver cancer Sister   . Lung cancer Brother   . COPD Brother     Social History Social History  Substance Use Topics  . Smoking status: Former Smoker    Packs/day: 1.00    Years: 20.00    Types: Cigarettes    Quit date: 04/01/1978  . Smokeless tobacco: Never Used  . Alcohol use 4.8 oz/week    4 Glasses of wine, 4 Shots of liquor per week     Comment: burbon or glass wine daily     Allergies   Alendronate sodium; Amiodarone; Chlorthalidone; Macrodantin [nitrofurantoin]; Meclizine; Norvasc [amlodipine besylate]; Hydralazine hcl; Sulfa antibiotics; and Tussionex pennkinetic er [hydrocod polst-cpm polst er]   Review of Systems Review of Systems  Constitutional: Negative for fever.  HENT: Negative for sore throat.   Eyes: Negative for visual disturbance.  Respiratory: Positive for cough, shortness of breath and wheezing.   Cardiovascular: Negative for chest pain.  Gastrointestinal: Negative for abdominal pain, nausea and vomiting.  Genitourinary: Negative for difficulty urinating.  Musculoskeletal: Negative for back pain and neck pain.  Skin:  Negative for rash.  Neurological: Negative for syncope and headaches.     Physical Exam Updated Vital Signs BP 105/61 (BP Location: Left Arm)   Pulse 79  Temp 97.7 F (36.5 C) (Oral)   Resp 20   Ht '5\' 4"'$  (1.626 m)   Wt 122 lb (55.3 kg)   SpO2 96%   BMI 20.94 kg/m   Physical Exam  Constitutional: She is oriented to person, place, and time. She appears well-developed and well-nourished. No distress.  HENT:  Head: Normocephalic and atraumatic.  Eyes: Conjunctivae and EOM are normal.  Neck: Normal range of motion.  Cardiovascular: Normal rate, regular rhythm, normal heart sounds and intact distal pulses.  Exam reveals no gallop and no friction rub.   No murmur heard. Pulmonary/Chest: Effort normal. No respiratory distress. She has decreased breath sounds in the right lower field. She has no wheezes. She has rhonchi (diffuse). She has no rales.  Abdominal: Soft. She exhibits no distension. There is no tenderness. There is no guarding.  Musculoskeletal: She exhibits no edema or tenderness.  Neurological: She is alert and oriented to person, place, and time.  Skin: Skin is warm and dry. No rash noted. She is not diaphoretic. No erythema.  Nursing note and vitals reviewed.    ED Treatments / Results  Labs (all labs ordered are listed, but only abnormal results are displayed) Labs Reviewed  CBC WITH DIFFERENTIAL/PLATELET - Abnormal; Notable for the following:       Result Value   RBC 3.67 (*)    MCV 101.9 (*)    RDW 16.5 (*)    Neutro Abs 7.8 (*)    All other components within normal limits  BASIC METABOLIC PANEL - Abnormal; Notable for the following:    Sodium 133 (*)    Chloride 92 (*)    CO2 33 (*)    Glucose, Bld 110 (*)    BUN 37 (*)    GFR calc non Af Amer 57 (*)    All other components within normal limits  CBC - Abnormal; Notable for the following:    RBC 3.33 (*)    Hemoglobin 10.9 (*)    HCT 34.3 (*)    MCV 103.0 (*)    RDW 16.8 (*)    All other  components within normal limits  BASIC METABOLIC PANEL - Abnormal; Notable for the following:    Sodium 134 (*)    Chloride 92 (*)    CO2 36 (*)    BUN 38 (*)    All other components within normal limits  BODY FLUID CELL COUNT WITH DIFFERENTIAL - Abnormal; Notable for the following:    Appearance, Fluid HAZY (*)    Monocyte-Macrophage-Serous Fluid 27 (*)    All other components within normal limits  LACTATE DEHYDROGENASE, BODY FLUID  PROTEIN, BODY FLUID  HEPARIN LEVEL (UNFRACTIONATED)  CYTOLOGY - NON PAP    EKG  EKG Interpretation None       Radiology Dg Chest 1 View  Result Date: 02/18/2016 CLINICAL DATA:  Right thoracentesis. EXAM: CHEST 1 VIEW COMPARISON:  Chest x-ray from yesterday FINDINGS: Diminished right pleural effusion. No pneumothorax. Known right hilar mass and lower lobe atelectasis. Cardiopericardial enlargement. Porta catheter on the left with tip at a left SVC. IMPRESSION: 1. No acute finding after right thoracentesis. 2. Right basilar atelectasis and known right hilar mass. Electronically Signed   By: Monte Fantasia M.D.   On: 02/18/2016 11:12   Dg Chest 2 View  Result Date: 02/17/2016 CLINICAL DATA:  Status post lung biopsy. Shortness of breath and hemoptysis. EXAM: CHEST  2 VIEW COMPARISON:  Chest radiograph 02/12/2016 FINDINGS: Unchanged appearance of left chest wall  Port-A-Cath with tip in the persistent left-sided SVC. There is aortic atherosclerosis. Median sternotomy wires and right axillary surgical clips are unchanged. Right pleural effusion has increased in size. The left lung is clear. No pneumothorax. No pulmonary edema. IMPRESSION: 1. Increased size of right pleural effusion with associated atelectasis. 2. Aortic atherosclerosis. Electronically Signed   By: Ulyses Jarred M.D.   On: 02/17/2016 22:51   Ct Angio Chest Aorta W/cm &/or Wo/cm  Result Date: 02/18/2016 CLINICAL DATA:  Hemoptysis. Mechanical bowel. Considering empiric bronchial  embolization. EXAM: CT ANGIOGRAPHY CHEST WITH CONTRAST TECHNIQUE: Multidetector CT imaging of the chest was performed using the standard protocol during bolus administration of intravenous contrast. Multiplanar CT image reconstructions and MIPs were obtained to evaluate the vascular anatomy. CONTRAST:  100 cc Isovue 370 intravenous COMPARISON:  02/12/2016 FINDINGS: Cardiovascular: Chronic cardiomegaly. The atria are particularly enlarged. Mechanical aortic valve without para valvular collection. Aneurysmal ascending aorta measuring up to 49 mm diameter. Aberrant right subclavian artery with retroesophageal course. There is a persistent left SVC with indwelling porta catheter. Two bronchial arteries are seen and are not hyper trophic. The more proximal has its origin near the aberrant left subclavian artery ostium. No pseudoaneurysm or active hemorrhage seen within the patient's malignancy. Pulmonary hypertension. No identified acute pulmonary embolism Mediastinum/Nodes: Known right hilar malignancy with airway obstruction. Necrosis present in right paratracheal lymph nodes. Lungs/Pleura: Material cast the right lower lobe and middle lobe airways with right lower lobe collapse. In this setting, this may reflect hemorrhage. Clusters of micronodules in the right upper lobe and left upper lobe that are chronic. No left-sided source of hemorrhage identified. Mild interstitial coarsening and ground-glass opacity in the right upper lobe is likely from vascular/lymphatic obstruction Upper Abdomen: No acute finding. Polycystic kidneys. Known left adrenal mass that is hypermetabolic by CT. Musculoskeletal: L1-2 advanced disc disease with sclerosis and subchondral cysts, degenerative appearing. No acute or aggressive finding. Review of the MIP images confirms the above findings. IMPRESSION: 1. Two non hypertrophied bronchial arteries identified. 2. Known right upper lobe malignancy with airway obstruction and bronchial  artery/vein narrowing. Material cast the right lower lobe and right middle lobe airways, new from 01/25/2016, presumably clot in this setting. The right lower lobe is completely collapsed. 3. Chronic mild bronchiolitis changes in the left upper lobe. No left-sided source of hemoptysis identified. 4. Ascending aortic aneurysm with aberrant right subclavian artery. Electronically Signed   By: Monte Fantasia M.D.   On: 02/18/2016 12:32   US Thoracentesis Asp Pleural Space W/img Guide  Result Date: 02/18/2016 INDICATION: Lung cancer. Shortness of breath. Right-sided pleural effusion. Request diagnostic and therapeutic thoracentesis. EXAM: ULTRASOUND GUIDED RIGHT THORACENTESIS MEDICATIONS: None. COMPLICATIONS: None immediate. Postprocedural chest x-ray negative for pneumothorax. PROCEDURE: An ultrasound guided thoracentesis was thoroughly discussed with the patient and questions answered. The benefits, risks, alternatives and complications were also discussed. The patient understands and wishes to proceed with the procedure. Written consent was obtained. Ultrasound was performed to localize and mark an adequate pocket of fluid in the right chest. The area was then prepped and draped in the normal sterile fashion. 1% Lidocaine was used for local anesthesia. Under ultrasound guidance a Safe-T-Centesis catheter was introduced. Thoracentesis was performed. The catheter was removed and a dressing applied. FINDINGS: A total of approximately 750 mL of clear yellow fluid was removed. Samples were sent to the laboratory as requested by the clinical team. IMPRESSION: Successful ultrasound guided right thoracentesis yielding 750 mL of pleural fluid. Read by: Ascencion Dike  PA-C Electronically Signed   By: Corrie Mckusick D.O.   On: 02/18/2016 11:00    Procedures Procedures (including critical care time)  Medications Ordered in ED Medications  loratadine (CLARITIN) tablet 10 mg (10 mg Oral Given 02/18/16 0937)    dexamethasone (DECADRON) tablet 8 mg (8 mg Oral Given 02/18/16 0732)  dicyclomine (BENTYL) capsule 10 mg (not administered)  cloNIDine (CATAPRES) tablet 0.1 mg (not administered)  celecoxib (CELEBREX) capsule 200 mg (not administered)  carvedilol (COREG) tablet 6.25 mg (6.25 mg Oral Given 02/18/16 0732)  amLODipine (NORVASC) tablet 5 mg (5 mg Oral Not Given 02/18/16 0213)  allopurinol (ZYLOPRIM) tablet 300 mg (300 mg Oral Not Given 02/18/16 0214)  acetaminophen (TYLENOL) tablet 1,000 mg (not administered)  irbesartan (AVAPRO) tablet 300 mg (300 mg Oral Given 02/18/16 0938)  zolpidem (AMBIEN) tablet 5 mg (5 mg Oral Not Given 02/18/16 0212)  traMADol (ULTRAM) tablet 50 mg (not administered)  simvastatin (ZOCOR) tablet 10 mg (10 mg Oral Given 02/18/16 0937)  potassium chloride (K-DUR,KLOR-CON) CR tablet 10 mEq (not administered)  LORazepam (ATIVAN) tablet 0.5 mg (not administered)  levothyroxine (SYNTHROID, LEVOTHROID) tablet 88 mcg (88 mcg Oral Given 02/18/16 0732)  hydrOXYzine (ATARAX/VISTARIL) tablet 25 mg (not administered)  sodium chloride flush (NS) 0.9 % injection 10-40 mL (not administered)  iopamidol (ISOVUE-370) 76 % injection 100 mL (not administered)  sodium chloride 0.9 % infusion (not administered)  heparin ADULT infusion 100 units/mL (25000 units/265m sodium chloride 0.45%) (not administered)  furosemide (LASIX) tablet 40 mg (not administered)  iopamidol (ISOVUE-370) 76 % injection (100 mLs Intravenous Contrast Given 02/18/16 1141)     Initial Impression / Assessment and Plan / ED Course  I have reviewed the triage vital signs and the nursing notes.  Pertinent labs & imaging results that were available during my care of the patient were reviewed by me and considered in my medical decision making (see chart for details).  Clinical Course    82year old female with history of recently diagnosed small cell lung cancer with biopsy obtained November 8 by Dr. BKyung Rudd  presentation to the emergency department on November 13 with hemoptysis, with history of atrial fib and mechanical heart valve on Lovenox, pt on home O2, presents concern for worsening hemoptysis. Lab work and chest x-ray showed increasing right pleural effusion. Patient with right side is pleural effusion which is slightly larger. Initially discussed with pulmonology with possible evaluation and discharge, however pt does reports that she's having increasing dyspnea and will admit her for observation and to quantify hemoptysis, as well as continue to evaluate her shortness of breath. Have low suspicion for pulmonary embolism with patient on Lovenox and recent negative CT Angio. Bleeding likely secondary to biopsy with pt on lovenox. Suspect some element of COPD, lung cancer and pleural effusion as contributors dyspnea. Pt admitted to Dr. GAlcario Drought  Final Clinical Impressions(s) / ED Diagnoses   Final diagnoses:  Pleural effusion on right  Hemoptysis    New Prescriptions Current Discharge Medication List       EGareth Morgan MD 02/18/16 1357

## 2016-02-18 NOTE — Progress Notes (Signed)
Radiation Oncology         [phone] ________________________________  Name: [name] MRN: [id number]  Date: 02/16/2016  DOB: 24-Aug-1926  PN:[id number] [name], MD  Curt Bears, MD  REFERRING PHYSICIAN: Curt Bears, MD  DIAGNOSIS: The encounter diagnosis was Malignant neoplasm of hilus of right lung ([place]).   HISTORY OF PRESENT ILLNESS::Deborah [name] is a 82 y.o. female who is seen for an initial consultation visit regarding the patient's diagnosis of extensive stage small cell lung cancer. The patient has presented with a large right hilar mass in addition to mediastinal lymphadenopathy and metastatic left adrenal gland nodule which was diagnosed in earlier this month. The patient was seen earlier today in medical oncology. She complains of some pain in the chest with radiation to the back in addition to some shortness of breath and hemoptysis. The hemoptysis began after bronchoscopy was recently performed. The patient's pathology was consistent with small cell carcinoma. Given these findings I have been asked to see the patient today for consideration of palliative radiation treatment to the right lung.  The patient was accompanied by multiple family members today. She is in good spirits.    PREVIOUS RADIATION THERAPY: Yes the patient received adjuvant radiation treatment to the right breast after a lumpectomy. This was given in the 1990s and was completed here in [place].   PAST MEDICAL HISTORY:  has a past medical history of Acute CHF (congestive heart failure) ([place]) (01/10/2016); Anemia; Arthritis; Breast cancer ([place]) (dx'd 1994); Chronic lower GI bleeding; Complication of anesthesia; GERD (gastroesophageal reflux disease); Gout; Heart murmur; High cholesterol; History of blood transfusion (1996; ~ 2013); History of breast cancer (02/16/2016); History of echocardiogram; History of nuclear stress test; Hypertension; Hypothyroidism; Lung cancer ([place]) (dx'd  01/2016); On supplemental oxygen therapy; Permanent atrial fibrillation ([place]); Pneumonia (07/2011); Rectus sheath hematoma (12/19/14 ); Small cell lung cancer, right ([place]) (02/16/2016); Stomach problems; and TIA (transient ischemic attack) (12/24/14).     PAST SURGICAL HISTORY: Past Surgical History:  Procedure Laterality Date  . ABDOMINAL HYSTERECTOMY    . AORTIC VALVE REPLACEMENT  1996   [employer]  . APPENDECTOMY    . arteriogram  01/12   NO RAS   . BREAST BIOPSY Right   . BREAST LUMPECTOMY Right   . CARDIAC CATHETERIZATION    . CARDIOVERSION     X 2  . CATARACT EXTRACTION W/ INTRAOCULAR LENS  IMPLANT, BILATERAL Bilateral    bilateral cataract with lens implants  . CHOLECYSTECTOMY    . COLONOSCOPY WITH PROPOFOL N/A 11/24/2012   Procedure: COLONOSCOPY WITH PROPOFOL;  Surgeon: Jeryl Columbia, MD;  Location: [facility] ENDOSCOPY;  Service: Endoscopy;  Laterality: N/A;  . ESOPHAGOGASTRODUODENOSCOPY (EGD) WITH PROPOFOL N/A 07/25/2015   Procedure: ESOPHAGOGASTRODUODENOSCOPY (EGD) WITH PROPOFOL;  Surgeon: Clarene Essex, MD;  Location: [facility] ENDOSCOPY;  Service: Endoscopy;  Laterality: N/A;  . HOT HEMOSTASIS N/A 07/25/2015   Procedure: HOT HEMOSTASIS (ARGON PLASMA COAGULATION/BICAP);  Surgeon: Clarene Essex, MD;  Location: [facility] ENDOSCOPY;  Service: Endoscopy;  Laterality: N/A;  . VIDEO BRONCHOSCOPY WITH ENDOBRONCHIAL ULTRASOUND N/A 02/07/2016   Procedure: VIDEO BRONCHOSCOPY WITH ENDOBRONCHIAL ULTRASOUND;  Surgeon: Collene Gobble, MD;  Location: [facility] OR;  Service: Thoracic;  Laterality: N/A;     FAMILY HISTORY: family history includes Breast cancer in her daughter; COPD in her brother; Colon cancer in her sister; Coronary artery disease in her father; Dementia in her mother; Heart attack in her father; Liver cancer in her sister; Lung cancer in her brother.  SOCIAL HISTORY:  reports that she quit smoking about 37 years ago. Her smoking use included Cigarettes. She has a 20.00 pack-year smoking history. She has never  used smokeless tobacco. She reports that she drinks about 4.8 oz of alcohol per week . She reports that she does not use drugs.   ALLERGIES: Alendronate sodium; Amiodarone; Chlorthalidone; Macrodantin [nitrofurantoin]; Meclizine; Norvasc [amlodipine besylate]; Hydralazine hcl; Sulfa antibiotics; and Tussionex pennkinetic er [hydrocod polst-cpm polst er]   MEDICATIONS:  No current facility-administered medications for this encounter.    No current outpatient prescriptions on file.   Facility-Administered Medications Ordered in Other Encounters  Medication Dose Route Frequency Provider Last Rate Last Dose  . acetaminophen (TYLENOL) tablet 1,000 mg  1,000 mg Oral Q6H PRN Etta Quill, DO   1,000 mg at 02/18/16 2002  . allopurinol (ZYLOPRIM) tablet 300 mg  300 mg Oral QHS Etta Quill, DO   300 mg at 02/18/16 2132  . amLODipine (NORVASC) tablet 5 mg  5 mg Oral QHS Jared M Gardner, DO      . bisacodyl (DULCOLAX) EC tablet 10 mg  10 mg Oral Daily PRN Albertine Patricia, MD      . carvedilol (COREG) tablet 6.25 mg  6.25 mg Oral BID WC Etta Quill, DO   6.25 mg at 02/18/16 0732  . celecoxib (CELEBREX) capsule 200 mg  200 mg Oral Daily PRN Etta Quill, DO      . cloNIDine (CATAPRES) tablet 0.1 mg  0.1 mg Oral Daily Etta Quill, DO      . dexamethasone (DECADRON) tablet 8 mg  8 mg Oral BID WC Etta Quill, DO   8 mg at 02/18/16 1739  . dicyclomine (BENTYL) capsule 10 mg  10 mg Oral Daily PRN Etta Quill, DO      . furosemide (LASIX) tablet 40 mg  40 mg Oral Daily Albertine Patricia, MD   40 mg at 02/18/16 1505  . heparin ADULT infusion 100 units/mL (25000 units/269m sodium chloride 0.45%)  800 Units/hr Intravenous Continuous AThomes Lolling RPH 8 mL/hr at 02/18/16 1741 800 Units/hr at 02/18/16 1741  . hydrOXYzine (ATARAX/VISTARIL) tablet 25 mg  25 mg Oral TID PRN JEtta Quill DO      . iopamidol (ISOVUE-370) 76 % injection 100 mL  100 mL Intravenous Once PRN DAlbertine Patricia MD      . irbesartan (AVAPRO) tablet 300 mg  300 mg Oral Daily JEtta Quill DO   300 mg at 02/18/16 07824 . levothyroxine (SYNTHROID, LEVOTHROID) tablet 88 mcg  88 mcg Oral QAC breakfast JEtta Quill DO   88 mcg at 02/18/16 0732  . loratadine (CLARITIN) tablet 10 mg  10 mg Oral Daily JEtta Quill DO   10 mg at 02/18/16 02353 . LORazepam (ATIVAN) tablet 0.5 mg  0.5 mg Oral Q8H PRN JEtta Quill DO      . potassium chloride (K-DUR,KLOR-CON) CR tablet 10 mEq  10 mEq Oral BID PRN JEtta Quill DO      . simvastatin (ZOCOR) tablet 10 mg  10 mg Oral Daily JEtta Quill DO   10 mg at 02/18/16 06144 . sodium chloride 0.9 % infusion           . sodium chloride flush (NS) 0.9 % injection 10-40 mL  10-40 mL Intracatheter PRN DAlbertine Patricia MD      . traMADol (ULTRAM) tablet 50 mg  50  mg Oral Q6H PRN Etta Quill, DO      . zolpidem [facility]) tablet 5 mg  5 mg Oral QHS Etta Quill, DO   5 mg at 02/18/16 2132     REVIEW OF SYSTEMS:  A 15 point review of systems is documented in the electronic medical record. This was obtained by the nursing staff. However, I reviewed this with the patient to discuss relevant findings and make appropriate changes.  Pertinent items are noted in HPI.    PHYSICAL EXAM:  vitals were not taken for this visit.  ECOG = 1  0 - Asymptomatic (Fully active, able to carry on all predisease activities without restriction)  1 - Symptomatic but completely ambulatory (Restricted in physically strenuous activity but ambulatory and able to carry out work of a light or sedentary nature. For example, light housework, office work)  2 - Symptomatic, <50% in bed during the day (Ambulatory and capable of all self care but unable to carry out any work activities. Up and about more than 50% of waking hours)  3 - Symptomatic, >50% in bed, but not bedbound (Capable of only limited self-care, confined to bed or chair 50% or more of waking hours)  4 -  Bedbound (Completely disabled. Cannot carry on any self-care. Totally confined to bed or chair)  5 - Death   Eustace Pen MM, Creech RH, Tormey DC, et al. [phone]). "Toxicity and response criteria of the [facility] Group". [place] Oncol. 5 (6): 649-55     LABORATORY DATA:  Lab Results  Component Value Date   WBC 6.3 02/18/2016   HGB 10.9 (L) 02/18/2016   HCT 34.3 (L) 02/18/2016   MCV 103.0 (H) 02/18/2016   PLT 231 02/18/2016   Lab Results  Component Value Date   NA 134 (L) 02/18/2016   K 4.1 02/18/2016   CL 92 (L) 02/18/2016   CO2 36 (H) 02/18/2016   Lab Results  Component Value Date   ALT 26 02/16/2016   AST 20 02/16/2016   ALKPHOS 55 02/16/2016   BILITOT 0.66 02/16/2016      RADIOGRAPHY: Dg Chest 1 View  Result Date: 02/18/2016 CLINICAL DATA:  Right thoracentesis. EXAM: CHEST 1 VIEW COMPARISON:  Chest x-ray from yesterday FINDINGS: Diminished right pleural effusion. No pneumothorax. Known right hilar mass and lower lobe atelectasis. Cardiopericardial enlargement. Porta catheter on the left with tip at a left SVC. IMPRESSION: 1. No acute finding after right thoracentesis. 2. Right basilar atelectasis and known right hilar mass. Electronically Signed   By: Monte Fantasia M.D.   On: 02/18/2016 11:12   Dg Chest 2 View  Result Date: 02/17/2016 CLINICAL DATA:  Status post lung biopsy. Shortness of breath and hemoptysis. EXAM: CHEST  2 VIEW COMPARISON:  Chest radiograph 02/12/2016 FINDINGS: Unchanged appearance of left chest wall Port-A-Cath with tip in the persistent left-sided SVC. There is aortic atherosclerosis. Median sternotomy wires and right axillary surgical clips are unchanged. Right pleural effusion has increased in size. The left lung is clear. No pneumothorax. No pulmonary edema. IMPRESSION: 1. Increased size of right pleural effusion with associated atelectasis. 2. Aortic atherosclerosis. Electronically Signed   By: Ulyses Jarred M.D.   On: 02/17/2016  22:51   Dg Chest 2 View  Result Date: 02/12/2016 CLINICAL DATA:  Hemoptysis.  Shortness of breath. EXAM: CHEST  2 VIEW COMPARISON:  01/11/2016 FINDINGS: Left Port-A-Cath is in place, unchanged with the tip in the persistent left-sided SVC. Prior median sternotomy. There is  cardiomegaly. Increasing right perihilar and right basilar airspace disease. No confluent opacity on the left. Small right pleural effusion. IMPRESSION: Increasing right perihilar and right lower lobe airspace opacities concerning for pneumonia. Followup PA and lateral chest X-ray is recommended in 3-4 weeks following trial of antibiotic therapy to ensure resolution and exclude underlying malignancy. Small right pleural effusion. Electronically Signed   By: Rolm Baptise M.D.   On: 02/12/2016 09:51   Ct Head W Wo Contrast  Result Date: 02/12/2016 CLINICAL DATA:  Metastatic lung cancer.  Staging. EXAM: CT HEAD WITHOUT AND WITH CONTRAST TECHNIQUE: Contiguous axial images were obtained from the base of the skull through the vertex without and with intravenous contrast CONTRAST:  34m ISOVUE-300 IOPAMIDOL (ISOVUE-300) INJECTION 61% COMPARISON:  CT head 12/26/2014 FINDINGS: Brain: Mild atrophy. Ventricle size within normal limits. Negative for acute infarct. Negative for hemorrhage or mass. Postcontrast imaging demonstrates normal enhancement. No enhancing mass lesion identified. Vascular: Normal arterial and venous enhancement. Skull: Negative Sinuses/Orbits: Mucosal thickening and opacification of the right sphenoid sinus. No orbital lesion. Other: None IMPRESSION: Negative for metastatic disease to the brain Right sphenoid sinus chronic opacification. Electronically Signed   By: CFranchot GalloM.D.   On: 02/12/2016 11:45   Ct Chest W Contrast  Result Date: 02/12/2016 CLINICAL DATA:  New diagnosis small cell lung cancer.  Hemoptysis. EXAM: CT CHEST WITH CONTRAST TECHNIQUE: Multidetector CT imaging of the chest was performed during  intravenous contrast administration. CONTRAST:  768mISOVUE-300 IOPAMIDOL (ISOVUE-300) INJECTION 61% COMPARISON:  01/10/2016 FINDINGS: Cardiovascular: Mild aneurysmal dilatation of the ascending thoracic aorta, 4.4 cm, stable since prior study. Mild cardiomegaly. Diffuse aortic calcifications. No dissection. There is a persistent left SVC noted. Port-A-Cath is in place with the tip in the persistent left SVC. Retroesophageal right subclavian artery again noted. Mediastinum/Nodes: Necrotic appearing right paratracheal lymph nodes, enlarged since prior study. Index node on image 50 measures 2.7 x 2.9 cm compared to 2.5 x 2.7 cm previously. Right peritracheal adenopathy on image 41 measures 3.1 x 2.5 cm compared to 2.7 x 2.0 cm previously. Lungs/Pleura: Large right hilar soft tissue mass noted, enlarging since prior study, measuring 5.4 x 3.8 cm compared with 4.2 x 3.6 cm previously. This mass is partially necrotic. Moderate right pleural effusion is similar to prior study. No left effusion. Airspace disease noted in the right upper lobe likely reflects postobstructive pneumonitis. Compressive atelectasis noted in the right lower lobe. Upper Abdomen: 16 mm nodule in the left adrenal gland appearance likely new since prior CT abdomen 08/05/2015. Multiple renal and hepatic cysts are noted. Musculoskeletal: No visible focal bone lesion or acute bony abnormality. IMPRESSION: Enlarging right hilar mass with ground-glass opacities throughout the right upper lobe, likely postobstructive pneumonitis. Enlarging necrotic appearing mediastinal adenopathy. Stable moderate right pleural effusion. Cardiomegaly. Stable mild aneurysmal dilatation of the ascending thoracic aorta. Apparent new small nodule in the left adrenal gland. Cannot exclude metastasis. Electronically Signed   By: KeRolm Baptise.D.   On: 02/12/2016 11:48   Nm Pet Image Initial (pi) Skull Base To Thigh  Result Date: 01/25/2016 CLINICAL DATA:  Initial treatment  strategy for right upper lobe lung mass detected on recent chest CT angiogram performed in the setting of chest tightness and shortness of breath. History of breast cancer. EXAM: NUCLEAR MEDICINE PET SKULL BASE TO THIGH TECHNIQUE: 5.8 mCi F-18 FDG was injected intravenously. Full-ring PET imaging was performed from the skull base to thigh after the radiotracer. CT data was obtained and used for attenuation  correction and anatomic localization. FASTING BLOOD GLUCOSE:  Value: 109 mg/dl COMPARISON:  01/10/2016 chest CT angiogram. 08/05/2015 CT abdomen/pelvis. FINDINGS: NECK No hypermetabolic lymph nodes in the neck. CHEST Hypermetabolic central right upper lobe 5.1 x 3.7 cm lung mass with max SUV 15.4 (series 8/image 29), which encases and occludes the associated segmental right upper lobe bronchus, and which is confluent with hypermetabolic right hilar and right lower paratracheal adenopathy. Patchy ground-glass opacity in the anterior right upper lobe is most consistent with postobstructive pneumonitis. Separate sharply marginated focus of subpleural reticulation in the anterior right upper lobe is consistent with mild radiation fibrosis. Mild patchy tree-in-bud opacities in the anterior left upper lobe with associated low level metabolism (max SUV 2.7), stable since 01/10/2016, favor mild infectious or inflammatory bronchiolitis. No additional significant pulmonary nodules. Small layering right pleural effusion. No left pleural effusion. No pleural nodularity or hypermetabolism. Hypermetabolic right paratracheal adenopathy, for example a 2.7 cm right lower paratracheal node with max SUV 13.6 (series 4/image 50). No contralateral mediastinal or contralateral hilar hypermetabolic adenopathy. Surgical clips are again noted in the right axilla. No hypermetabolic axillary lymph nodes. Duplicated superior vena cava. Left internal jugular MediPort terminates within the lower third of the left superior vena cava. Moderate  cardiomegaly. Left main, left anterior descending, left circumflex and right coronary atherosclerosis. Stable atherosclerotic aneurysmal ascending thoracic aorta, maximum diameter 4.8 cm. Aberrant nonaneurysmal right subclavian artery arising from the distal aortic arch with retroesophageal course. Stable prominently dilated main pulmonary artery (4.7 cm diameter). ABDOMEN/PELVIS Left adrenal 1.7 cm hypermetabolic nodule with max SUV 12.0 (series 4/image 95), new since 08/05/2015. No right adrenal hypermetabolism or nodularity. Focus of hypermetabolism in the lateral left lower ventral abdominal muscle wall with associated vague low-attenuation 1.4 cm lesion on the CT images with max SUV 7.1 (series 4/image 123). Focal hypermetabolism at the anorectal junction with max SUV 6.6, without discrete mass or definite wall thickening on the CT images. No abnormal hypermetabolic activity within the liver, pancreas, or spleen. No hypermetabolic lymph nodes in the abdomen or pelvis. Cholecystectomy. Atherosclerotic nonaneurysmal abdominal aorta. Grossly stable enlarged polycystic kidneys with scattered subcentimeter hyperdense renal cortical lesions throughout both kidneys, too small to characterize. Largest simple renal cyst measures 6.0 cm in the far lower right kidney and 7.4 cm in the lower left kidney. Hysterectomy. SKELETON No focal hypermetabolic activity to suggest skeletal metastasis. Sternotomy wires appear aligned and intact. IMPRESSION: 1. Hypermetabolic central right upper lobe 4.1 cm lung mass, which is confluent with hypermetabolic right hilar and right paratracheal adenopathy. Hypermetabolic left adrenal nodule. Favor stage IV (T2b N2 M1b) primary bronchogenic carcinoma. 2. Possible small soft tissue metastasis in the lateral left lower ventral abdominal muscle wall. 3. Nonspecific focal hypermetabolism at the anorectal junction without definite CT correlate, favor physiologic uptake, although a neoplasm  cannot be excluded and clinical correlation is necessary. 4. Mild patchy tree-in-bud opacity in the anterior left upper lobe with associated low level metabolism, favor mild infectious or inflammatory bronchiolitis. 5. Postobstructive pneumonitis in the right upper lobe. 6. Cardiomegaly. Small layering right pleural effusion. No appreciable pleural nodularity or hypermetabolism . 7. Duplicated SVC. Aberrant right subclavian artery. Aortic atherosclerosis. Stable ascending thoracic aortic aneurysm, maximum diameter 4.8 cm. Stable prominently dilated main pulmonary artery suggesting chronic pulmonary arterial hypertension. 8. Polycystic kidneys. Electronically Signed   By: Ilona Sorrel M.D.   On: 01/25/2016 16:00   Ct Angio Chest Aorta W/cm &/or Wo/cm  Result Date: 02/18/2016 CLINICAL DATA:  Hemoptysis. Mechanical bowel.  Considering empiric bronchial embolization. EXAM: CT ANGIOGRAPHY CHEST WITH CONTRAST TECHNIQUE: Multidetector CT imaging of the chest was performed using the standard protocol during bolus administration of intravenous contrast. Multiplanar CT image reconstructions and MIPs were obtained to evaluate the vascular anatomy. CONTRAST:  100 cc Isovue 370 intravenous COMPARISON:  02/12/2016 FINDINGS: Cardiovascular: Chronic cardiomegaly. The atria are particularly enlarged. Mechanical aortic valve without para valvular collection. Aneurysmal ascending aorta measuring up to 49 mm diameter. Aberrant right subclavian artery with retroesophageal course. There is a persistent left SVC with indwelling porta catheter. Two bronchial arteries are seen and are not hyper trophic. The more proximal has its origin near the aberrant left subclavian artery ostium. No pseudoaneurysm or active hemorrhage seen within the patient's malignancy. Pulmonary hypertension. No identified acute pulmonary embolism Mediastinum/Nodes: Known right hilar malignancy with airway obstruction. Necrosis present in right paratracheal lymph  nodes. Lungs/Pleura: Material cast the right lower lobe and middle lobe airways with right lower lobe collapse. In this setting, this may reflect hemorrhage. Clusters of micronodules in the right upper lobe and left upper lobe that are chronic. No left-sided source of hemorrhage identified. Mild interstitial coarsening and ground-glass opacity in the right upper lobe is likely from vascular/lymphatic obstruction Upper Abdomen: No acute finding. Polycystic kidneys. Known left adrenal mass that is hypermetabolic by CT. Musculoskeletal: L1-2 advanced disc disease with sclerosis and subchondral cysts, degenerative appearing. No acute or aggressive finding. Review of the MIP images confirms the above findings. IMPRESSION: 1. Two non hypertrophied bronchial arteries identified. 2. Known right upper lobe malignancy with airway obstruction and bronchial artery/vein narrowing. Material cast the right lower lobe and right middle lobe airways, new from 01/25/2016, presumably clot in this setting. The right lower lobe is completely collapsed. 3. Chronic mild bronchiolitis changes in the left upper lobe. No left-sided source of hemoptysis identified. 4. Ascending aortic aneurysm with aberrant right subclavian artery. Electronically Signed   By: Monte Fantasia M.D.   On: 02/18/2016 12:32   US Thoracentesis Asp Pleural Space W/img Guide  Result Date: 02/18/2016 INDICATION: Lung cancer. Shortness of breath. Right-sided pleural effusion. Request diagnostic and therapeutic thoracentesis. EXAM: ULTRASOUND GUIDED RIGHT THORACENTESIS MEDICATIONS: None. COMPLICATIONS: None immediate. Postprocedural chest x-ray negative for pneumothorax. PROCEDURE: An ultrasound guided thoracentesis was thoroughly discussed with the patient and questions answered. The benefits, risks, alternatives and complications were also discussed. The patient understands and wishes to proceed with the procedure. Written consent was obtained. Ultrasound was  performed to localize and mark an adequate pocket of fluid in the right chest. The area was then prepped and draped in the normal sterile fashion. 1% Lidocaine was used for local anesthesia. Under ultrasound guidance a Safe-T-Centesis catheter was introduced. Thoracentesis was performed. The catheter was removed and a dressing applied. FINDINGS: A total of approximately 750 mL of clear yellow fluid was removed. Samples were sent to the laboratory as requested by the clinical team. IMPRESSION: Successful ultrasound guided right thoracentesis yielding 750 mL of pleural fluid. Read by: Ascencion Dike PA-C Electronically Signed   By: Corrie Mckusick D.O.   On: 02/18/2016 11:00       IMPRESSION:  The patient has extensive stage small cell lung cancer with significant central disease within the right hilar region/mediastinum. She is complaining of worsening shortness of breath and hemoptysis, with the latter being present since bronchoscopy. I believe she is a good candidate for palliative radiation treatment to the right lung to shrink the tumor and to help with hemoptysis. We discussed the possible  benefit of treatment as well as possible side effects and risks. All of her questions were answered today. The patient is interested in proceeding with this treatment. She is focused on quality of life and we discussed my feeling that radiation treatment at this time will be helpful in terms of this.   PLAN: The patient will proceed with simulation today such that we can proceed with treatment planning. I anticipate beginning her radiation treatment as soon as possible on Monday, 02/19/2016. I anticipate treating her with a two-week course of radiation treatment.    ________________________________   Jodelle Gross, MD, PhD   **Disclaimer: This note was dictated with voice recognition software. Similar sounding words can inadvertently be transcribed and this note may contain transcription errors which may not  have been corrected upon publication of note.**

## 2016-02-18 NOTE — H&P (Signed)
History and Physical    Deborah [name] KPT:[id number] DOB: 29-Nov-1926 DOA: 02/17/2016   PCP: Mathews Argyle, MD Chief Complaint:  Chief Complaint  Patient presents with  . Cough    up blood    HPI: Deborah [name] is a 82 y.o. female with medical history significant of mechanical valve replacement on chronic lovenox, patient just had endobronchial biopsy for lung mass performed on 11/8 by Dr. Lamonte Sakai, this has come back showing SCLC of the lung, initial oncology visit 2 days ago with Dr. Julien Nordmann.  Patient presents to the ED with c/o SOB and hemoptysis.  Hemoptysis has been ongoing since Tuesday.  Symptoms are persistent.  Nothing makes better or worse.  SOB has been worsening gradually.  Small amount of blood (about table spoon full) at most each time.  ED Course: Also noted to have worsening R pleural effusion on X ray.  Review of Systems: As per HPI otherwise 10 point review of systems negative.    Past Medical History:  Diagnosis Date  . Acute CHF (congestive heart failure) ([place]) 01/10/2016  . Anemia   . Arthritis    "fingers, toes, hips; qwhere" (05/19/2013)  . Breast cancer ([place]) dx'd 1994   "right; S/P lumpectomy, chemo, XRT"   . Chronic lower GI bleeding   . Complication of anesthesia    "once I'm awakened, I don't sleep til the following night" BP drops very low had to stop putting in [place] -a- cath  . GERD (gastroesophageal reflux disease)   . Gout   . Heart murmur   . High cholesterol   . History of blood transfusion 1996; ~ 2013   "related to valve replacement; GI bleeding"   . History of breast cancer 02/16/2016  . History of echocardiogram    Echo 1/17: mild LVH, EF 50-55%, no RWMA, mild AI, mechanical AVR with mean 21 mmHg/peak 42 mmHg (stable since 2012), mod MR, mod to severe BAE, PASP 36 mmHg  . History of nuclear stress test    Myoview 1/17: EF 59%, normal perfusion, low risk  . Hypertension   . Hypothyroidism   . Lung cancer ([place]) dx'd 01/2016   SCL  . On supplemental oxygen therapy   . Permanent atrial fibrillation ([place])       . Pneumonia 07/2011  . Rectus sheath hematoma 12/19/14    held anticoagulation for 1 week, transfused  . Small cell lung cancer, right ([place]) 02/16/2016  . Stomach problems    seeing Dr Watt Climes  . TIA (transient ischemic attack) 12/24/14   symptoms resolved    Past Surgical History:  Procedure Laterality Date  . ABDOMINAL HYSTERECTOMY    . AORTIC VALVE REPLACEMENT  1996   [employer]  . APPENDECTOMY    . arteriogram  01/12   NO RAS   . BREAST BIOPSY Right   . BREAST LUMPECTOMY Right   . CARDIAC CATHETERIZATION    . CARDIOVERSION     X 2  . CATARACT EXTRACTION W/ INTRAOCULAR LENS  IMPLANT, BILATERAL Bilateral    bilateral cataract with lens implants  . CHOLECYSTECTOMY    . COLONOSCOPY WITH PROPOFOL N/A 11/24/2012   Procedure: COLONOSCOPY WITH PROPOFOL;  Surgeon: Jeryl Columbia, MD;  Location: [facility] ENDOSCOPY;  Service: Endoscopy;  Laterality: N/A;  . ESOPHAGOGASTRODUODENOSCOPY (EGD) WITH PROPOFOL N/A 07/25/2015   Procedure: ESOPHAGOGASTRODUODENOSCOPY (EGD) WITH PROPOFOL;  Surgeon: Clarene Essex, MD;  Location: [facility] ENDOSCOPY;  Service: Endoscopy;  Laterality: N/A;  . HOT HEMOSTASIS N/A 07/25/2015   Procedure:  HOT HEMOSTASIS (ARGON PLASMA COAGULATION/BICAP);  Surgeon: Clarene Essex, MD;  Location: [facility] ENDOSCOPY;  Service: Endoscopy;  Laterality: N/A;  . VIDEO BRONCHOSCOPY WITH ENDOBRONCHIAL ULTRASOUND N/A 02/07/2016   Procedure: VIDEO BRONCHOSCOPY WITH ENDOBRONCHIAL ULTRASOUND;  Surgeon: Collene Gobble, MD;  Location: [place];  Service: Thoracic;  Laterality: N/A;     reports that she quit smoking about 37 years ago. Her smoking use included Cigarettes. She has a 20.00 pack-year smoking history. She has never used smokeless tobacco. She reports that she drinks about 4.8 oz of alcohol per week . She reports that she does not use drugs.  Allergies  Allergen Reactions  . Alendronate Sodium Other (See Comments)    GI  PAIN CAUTION: RISK FOR PERFORATION  . Amiodarone Shortness Of Breath  . Chlorthalidone Shortness Of Breath    ABDOMINAL BLOATING  . Macrodantin [Nitrofurantoin] Nausea And Vomiting and Other (See Comments)    CONTRAINDICATED WITH GFR < 50  . Meclizine Swelling    SWELLING OF TONGUE  . Norvasc [Amlodipine Besylate] Swelling    SWELLING REACTION UNSPECIFIED   . Hydralazine Hcl Nausea Only  . Sulfa Antibiotics Other (See Comments)    Unknown  . Tussionex Pennkinetic Er [Hydrocod Polst-Cpm Polst Er] Other (See Comments)    UNSPECIFIED REACTION     Family History  Problem Relation Age of Onset  . Dementia Mother   . Coronary artery disease Father   . Heart attack Father   . Breast cancer Daughter   . Colon cancer Sister   . Liver cancer Sister   . Lung cancer Brother   . COPD Brother       Prior to Admission medications   Medication Sig Start Date End Date Taking? Authorizing Provider  acetaminophen (TYLENOL) 500 MG tablet Take 1,000 mg by mouth every 6 (six) hours as needed for mild pain, moderate pain or headache. Reported on 09/26/2015   Yes Historical Provider, MD  allopurinol (ZYLOPRIM) 300 MG tablet Take 300 mg by mouth at bedtime.    Yes Historical Provider, MD  amLODipine (NORVASC) 5 MG tablet Take 5 mg by mouth at bedtime.   Yes Historical Provider, MD  calcium-vitamin D 500 MG tablet Take 1 tablet by mouth 2 (two) times daily. Reported on 09/26/2015   Yes Historical Provider, MD  carvedilol (COREG) 6.25 MG tablet TAKE 6.25 MG BY MOUTH TWICE DAILY 02/07/16  Yes Collene Gobble, MD  celecoxib (CELEBREX) 200 MG capsule Take 200 mg by mouth daily as needed for moderate pain.  12/07/14  Yes Historical Provider, MD  cloNIDine (CATAPRES) 0.1 MG tablet Take 0.1 mg by mouth daily.    Yes Historical Provider, MD  dexamethasone (DECADRON) 4 MG tablet Take 2 tablets (8 mg total) by mouth 2 (two) times daily with a meal. 02/09/16  Yes [name], MD  dicyclomine (BENTYL) 10 MG  capsule Take 10 mg by mouth daily as needed for spasms.    Yes Historical Provider, MD  enoxaparin (LOVENOX) 60 MG/0.6ML injection INJECT 0.22m IN TO SKIN EVERY 12 HOURS 02/05/16  Yes GChauncey Cruel MD  fexofenadine ([facility]) 180 MG tablet Take 180 mg by mouth daily.    Yes Historical Provider, MD  furosemide (LASIX) 40 MG tablet Take 1-2 tablets daily as directed. Patient taking differently: Take 40 mg by mouth 2 (two) times daily as needed for fluid.  12/28/15  Yes Rhonda G Barrett, PA-C  hydrOXYzine (ATARAX/VISTARIL) 25 MG tablet Take 25 mg by mouth 3 (three)  times daily as needed for anxiety.   Yes Historical Provider, MD  levothyroxine (SYNTHROID, LEVOTHROID) 88 MCG tablet Take 1 tablet (88 mcg total) by mouth daily before breakfast. 10/10/15  Yes Skeet Latch, MD  LORazepam (ATIVAN) 0.5 MG tablet Take 1 tablet (0.5 mg total) by mouth every 8 (eight) hours as needed for anxiety. 02/16/16  Yes Curt Bears, MD  NITROSTAT 0.4 MG SL tablet Place 1 tablet (0.4 mg total) under the tongue every 5 (five) minutes as needed for chest pain (x 3 doses). Reported on 10/17/2015 02/15/16  Yes Skeet Latch, MD  OXYGEN Inhale 2 L into the lungs continuous.   Yes Historical Provider, MD  potassium chloride SA (K-DUR,KLOR-CON) 20 MEQ tablet Take 0.5 tablets (10 mEq total) by mouth 2 (two) times daily as needed (taking with Lasix). 02/07/16  Yes Collene Gobble, MD  Probiotic Product (PROBIOTIC ADVANCED PO) Take 1 tablet by mouth daily. Reported on 10/17/2015   Yes Historical Provider, MD  simvastatin (ZOCOR) 20 MG tablet Take 10 mg by mouth daily.    Yes Historical Provider, MD  traMADol (ULTRAM) 50 MG tablet Take 1 tablet (50 mg total) by mouth every 6 (six) hours as needed. Patient taking differently: Take 50 mg by mouth every 6 (six) hours as needed for moderate pain or severe pain.  02/16/16  Yes Almyra Deforest, PA  valsartan (DIOVAN) 320 MG tablet Take 1 tablet (320 mg total) by mouth daily. 09/07/15   Yes Skeet Latch, MD  zolpidem (AMBIEN) 5 MG tablet Take 5 mg by mouth at bedtime.    Yes Historical Provider, MD    Physical Exam: Vitals:   02/17/16 2150 02/17/16 2155 02/18/16 0003  BP: 119/86  179/98  Pulse: 98  105  Resp: 22  24  Temp: 98.1 F (36.7 C)  98.1 F (36.7 C)  TempSrc: Oral  Oral  SpO2: 95%  94%  Weight: 55.3 kg (122 lb) 55.3 kg (122 lb)   Height: 5' (1.524 m) '5\' 4"'$  (1.626 m)       Constitutional: NAD, calm, comfortable Eyes: PERRL, lids and conjunctivae normal ENMT: Mucous membranes are moist. Posterior pharynx clear of any exudate or lesions.Normal dentition.  Neck: normal, supple, no masses, no thyromegaly Respiratory: clear to auscultation bilaterally, no wheezing, no crackles. Normal respiratory effort. No accessory muscle use.  Cardiovascular: Regular rate and rhythm, no murmurs / rubs / gallops. No extremity edema. 2+ pedal pulses. No carotid bruits.  Abdomen: no tenderness, no masses palpated. No hepatosplenomegaly. Bowel sounds positive.  Musculoskeletal: no clubbing / cyanosis. No joint deformity upper and lower extremities. Good ROM, no contractures. Normal muscle tone.  Skin: no rashes, lesions, ulcers. No induration Neurologic: CN 2-12 grossly intact. Sensation intact, DTR normal. Strength 5/5 in all 4.  Psychiatric: Normal judgment and insight. Alert and oriented x 3. Normal mood.    Labs on Admission: I have personally reviewed following labs and imaging studies  CBC:  Recent Labs Lab 02/12/16 0927 02/16/16 1212 02/17/16 2312  WBC 4.7 6.4 9.2  NEUTROABS 4.0 5.3 7.8*  HGB 10.6* 12.5 12.0  HCT 33.1* 38.8 37.4  MCV 100.9* 100.9 101.9*  PLT 243 268 035   Basic Metabolic Panel:  Recent Labs Lab 02/12/16 0927 02/16/16 1212 02/17/16 2312  NA 135 137 133*  K 4.1 4.6 4.2  CL 96*  --  92*  CO2 33* 31* 33*  GLUCOSE 120* 86 110*  BUN 38* 29.4* 37*  CREATININE 1.01* 0.8 0.88  CALCIUM 10.3  10.6* 10.0   GFR: Estimated Creatinine  Clearance: 38.9 mL/min (by C-G formula based on SCr of 0.88 mg/dL). Liver Function Tests:  Recent Labs Lab 02/12/16 0927 02/16/16 1212  AST 16 20  ALT 23 26  ALKPHOS 40 55  BILITOT 0.8 0.66  PROT 5.9* 6.4  ALBUMIN 3.6 3.6   No results for input(s): LIPASE, AMYLASE in the last 168 hours. No results for input(s): AMMONIA in the last 168 hours. Coagulation Profile: No results for input(s): INR, PROTIME in the last 168 hours. Cardiac Enzymes:  Recent Labs Lab 02/12/16 0927  TROPONINI <0.03   BNP (last 3 results) No results for input(s): PROBNP in the last 8760 hours. HbA1C: No results for input(s): HGBA1C in the last 72 hours. CBG: No results for input(s): GLUCAP in the last 168 hours. Lipid Profile: No results for input(s): CHOL, HDL, LDLCALC, TRIG, CHOLHDL, LDLDIRECT in the last 72 hours. Thyroid Function Tests: No results for input(s): TSH, T4TOTAL, FREET4, T3FREE, THYROIDAB in the last 72 hours. Anemia Panel: No results for input(s): VITAMINB12, FOLATE, FERRITIN, TIBC, IRON, RETICCTPCT in the last 72 hours. Urine analysis:    Component Value Date/Time   COLORURINE YELLOW 12/27/2015 1254   APPEARANCEUR CLEAR 12/27/2015 1254   LABSPEC 1.007 12/27/2015 1254   LABSPEC 1.010 03/22/2015 0917   PHURINE 6.5 12/27/2015 1254   GLUCOSEU NEGATIVE 12/27/2015 1254   GLUCOSEU Negative 03/22/2015 0917   HGBUR NEGATIVE 12/27/2015 1254   BILIRUBINUR NEGATIVE 12/27/2015 1254   BILIRUBINUR Negative 03/22/2015 0917   KETONESUR NEGATIVE 12/27/2015 1254   PROTEINUR NEGATIVE 12/27/2015 1254   UROBILINOGEN 0.2 03/22/2015 0917   NITRITE NEGATIVE 12/27/2015 1254   LEUKOCYTESUR NEGATIVE 12/27/2015 1254   LEUKOCYTESUR Negative 03/22/2015 0917   Sepsis Labs: '@LABRCNTIP'$ (procalcitonin:4,lacticidven:4) )No results found for this or any previous visit (from the past 240 hour(s)).   Radiological Exams on Admission: Dg Chest 2 View  Result Date: 02/17/2016 CLINICAL DATA:  Status post  lung biopsy. Shortness of breath and hemoptysis. EXAM: CHEST  2 VIEW COMPARISON:  Chest radiograph 02/12/2016 FINDINGS: Unchanged appearance of left chest wall Port-A-Cath with tip in the persistent left-sided SVC. There is aortic atherosclerosis. Median sternotomy wires and right axillary surgical clips are unchanged. Right pleural effusion has increased in size. The left lung is clear. No pneumothorax. No pulmonary edema. IMPRESSION: 1. Increased size of right pleural effusion with associated atelectasis. 2. Aortic atherosclerosis. Electronically Signed   By: Ulyses Jarred M.D.   On: 02/17/2016 22:51    EKG: Independently reviewed.  Assessment/Plan Principal Problem:   Hemoptysis Active Problems:   Pleural effusion on right   Small cell lung cancer, right (HCC)   Dyspnea    1. Hemoptysis - 1. Repeat CBC in AM 2. Due to mechanical valve will have to keep patient on anticoagulation unless bleed becomes severe or massive (not currently) 3. HGB currently 12 4. Source is of course the [place] that was biopsied earlier this month. 2. Pleural effusion on right 1. Worsening 2. Worrisome for malignant effusion given known R lung SCLC 3. Due to symptoms of dyspnea, will have IR tap this for drainage. 4. Converting patient from lovenox to heparin gtt overnight for procedure.   DVT prophylaxis: heparin gtt Code Status: DNR Family Communication: Daughter at bedside Consults called: None Admission status: Admit to obs   [place], [place] Pager [phone] from 7PM-7AM  If 7AM-7PM, please contact the day physician for the patient [email or web address] Password [email or web address]  02/18/2016, 1:45 AM

## 2016-02-18 NOTE — Consult Note (Signed)
Name: Deborah [name] MRN: [id number] DOB: April 19, 1926    ADMISSION DATE:  02/17/2016 CONSULTATION DATE:  02/18/16  REFERRING MD :  Elgergawy  CHIEF COMPLAINT:  hemoptysis  BRIEF PATIENT DESCRIPTION: 82F w/ recently dx extensive stage SCLC, hx CHF, prior breast ca, GERD, mechanical aortic valve on a/c, HLD, HTN, hypothyroidism presenting with worsening hemoptysis (1 Tbs every 1-2 h) x 24h  SIGNIFICANT EVENTS    STUDIES:  CXR 02/17/16 R effusion; otherwise largely clear.    HISTORY OF PRESENT ILLNESS:   Deborah [name] is an 68F with recently diagnosed SCLC (extensive stage) who underwent lung/LN biopsies on 02/07/16 and has since been having small volume hemoptysis in the setting of chronic anticoagulation for a mechanical aortic valve. Over the last day or so the frequency and volume of hemoptysis has increased, prompting her to come to the ED. Other PMH is significant for heart failure, GERD, HTN, HLD, hypothyroidism, AVR on chronic a/c, prior breast ca.  She is resting and her daughter would prefer she not be disturbed. She awakened briefly and denied pain. Her shortness of breath has gotten worse over the last few days. No fever or chills. No nausea/vomiting. She just hasn't been feeling well. Since being in the ED she has coughed up ~1 Tbs every 1-2hrs. She had her last dose of Lovenox around 2030 this evening. She has not yet started radiation therapy, but is due to start this week. She is most interested in quality of life and has a portable DNR. She would not want to be intubated.   PAST MEDICAL HISTORY :   has a past medical history of Acute CHF (congestive heart failure) ([place]) (01/10/2016); Anemia; Arthritis; Breast cancer ([place]) (dx'd 1994); Chronic lower GI bleeding; Complication of anesthesia; GERD (gastroesophageal reflux disease); Gout; Heart murmur; High cholesterol; History of blood transfusion (1996; ~ 2013); History of breast cancer (02/16/2016); History of echocardiogram;  History of nuclear stress test; Hypertension; Hypothyroidism; Lung cancer ([place]) (dx'd 01/2016); On supplemental oxygen therapy; Permanent atrial fibrillation ([place]); Pneumonia (07/2011); Rectus sheath hematoma (12/19/14 ); Small cell lung cancer, right ([employer]) (02/16/2016); Stomach problems; and TIA (transient ischemic attack) (12/24/14).  has a past surgical history that includes arteriogram (01/12); Cardioversion; Cardiac catheterization; Cataract extraction w/ intraocular lens  implant, bilateral (Bilateral); Cholecystectomy; Abdominal hysterectomy; Appendectomy; Colonoscopy with propofol (N/A, 11/24/2012); Breast biopsy (Right); Breast lumpectomy (Right); Aortic valve replacement (1996); Esophagogastroduodenoscopy (egd) with propofol (N/A, 07/25/2015); Hot hemostasis (N/A, 07/25/2015); and Video bronchoscopy with endobronchial ultrasound (N/A, 02/07/2016). Prior to Admission medications   Medication Sig Start Date End Date Taking? Authorizing Provider  acetaminophen (TYLENOL) 500 MG tablet Take 1,000 mg by mouth every 6 (six) hours as needed for mild pain, moderate pain or headache. Reported on 09/26/2015   Yes Historical Provider, MD  allopurinol (ZYLOPRIM) 300 MG tablet Take 300 mg by mouth at bedtime.    Yes Historical Provider, MD  amLODipine (NORVASC) 5 MG tablet Take 5 mg by mouth at bedtime.   Yes Historical Provider, MD  calcium-vitamin D 500 MG tablet Take 1 tablet by mouth 2 (two) times daily. Reported on 09/26/2015   Yes Historical Provider, MD  carvedilol (COREG) 6.25 MG tablet TAKE 6.25 MG BY MOUTH TWICE DAILY 02/07/16  Yes Collene Gobble, MD  celecoxib (CELEBREX) 200 MG capsule Take 200 mg by mouth daily as needed for moderate pain.  12/07/14  Yes Historical Provider, MD  cloNIDine (CATAPRES) 0.1 MG tablet Take 0.1 mg by mouth daily.    Yes Historical  Provider, MD  dexamethasone (DECADRON) 4 MG tablet Take 2 tablets (8 mg total) by mouth 2 (two) times daily with a meal. 02/09/16  Yes [name],  MD  dicyclomine (BENTYL) 10 MG capsule Take 10 mg by mouth daily as needed for spasms.    Yes Historical Provider, MD  enoxaparin (LOVENOX) 60 MG/0.6ML injection INJECT 0.39m IN TO SKIN EVERY 12 HOURS 02/05/16  Yes GChauncey Cruel MD  fexofenadine ([facility]) 180 MG tablet Take 180 mg by mouth daily.    Yes Historical Provider, MD  furosemide (LASIX) 40 MG tablet Take 1-2 tablets daily as directed. Patient taking differently: Take 40 mg by mouth 2 (two) times daily as needed for fluid.  12/28/15  Yes Rhonda G Barrett, PA-C  hydrOXYzine (ATARAX/VISTARIL) 25 MG tablet Take 25 mg by mouth 3 (three) times daily as needed for anxiety.   Yes Historical Provider, MD  levothyroxine (SYNTHROID, LEVOTHROID) 88 MCG tablet Take 1 tablet (88 mcg total) by mouth daily before breakfast. 10/10/15  Yes TSkeet Latch MD  LORazepam (ATIVAN) 0.5 MG tablet Take 1 tablet (0.5 mg total) by mouth every 8 (eight) hours as needed for anxiety. 02/16/16  Yes MCurt Bears MD  NITROSTAT 0.4 MG SL tablet Place 1 tablet (0.4 mg total) under the tongue every 5 (five) minutes as needed for chest pain (x 3 doses). Reported on 10/17/2015 02/15/16  Yes TSkeet Latch MD  OXYGEN Inhale 2 L into the lungs continuous.   Yes Historical Provider, MD  potassium chloride SA (K-DUR,KLOR-CON) 20 MEQ tablet Take 0.5 tablets (10 mEq total) by mouth 2 (two) times daily as needed (taking with Lasix). 02/07/16  Yes RCollene Gobble MD  Probiotic Product (PROBIOTIC ADVANCED PO) Take 1 tablet by mouth daily. Reported on 10/17/2015   Yes Historical Provider, MD  simvastatin (ZOCOR) 20 MG tablet Take 10 mg by mouth daily.    Yes Historical Provider, MD  traMADol (ULTRAM) 50 MG tablet Take 1 tablet (50 mg total) by mouth every 6 (six) hours as needed. Patient taking differently: Take 50 mg by mouth every 6 (six) hours as needed for moderate pain or severe pain.  02/16/16  Yes HAlmyra Deforest PA  valsartan (DIOVAN) 320 MG tablet Take 1 tablet (320  mg total) by mouth daily. 09/07/15  Yes TSkeet Latch MD  zolpidem (AMBIEN) 5 MG tablet Take 5 mg by mouth at bedtime.    Yes Historical Provider, MD   Allergies  Allergen Reactions  . Alendronate Sodium Other (See Comments)    GI PAIN CAUTION: RISK FOR PERFORATION  . Amiodarone Shortness Of Breath  . Chlorthalidone Shortness Of Breath    ABDOMINAL BLOATING  . Macrodantin [Nitrofurantoin] Nausea And Vomiting and Other (See Comments)    CONTRAINDICATED WITH GFR < 50  . Meclizine Swelling    SWELLING OF TONGUE  . Norvasc [Amlodipine Besylate] Swelling    SWELLING REACTION UNSPECIFIED   . Hydralazine Hcl Nausea Only  . Sulfa Antibiotics Other (See Comments)    Unknown  . Tussionex Pennkinetic Er [Hydrocod Polst-Cpm Polst Er] Other (See Comments)    UNSPECIFIED REACTION     FAMILY HISTORY:  family history includes Breast cancer in her daughter; COPD in her brother; Colon cancer in her sister; Coronary artery disease in her father; Dementia in her mother; Heart attack in her father; Liver cancer in her sister; Lung cancer in her brother.   SOCIAL HISTORY:  reports that she quit smoking about 37 years ago. Her smoking  use included Cigarettes. She has a 20.00 pack-year smoking history. She has never used smokeless tobacco. She reports that she drinks about 4.8 oz of alcohol per week . She reports that she does not use drugs.  REVIEW OF SYSTEMS:   Deferred at daughter's request. Pertinent positives and negatives in HPI.   SUBJECTIVE:   VITAL SIGNS: Temp:  [97.9 F (36.6 C)-98.1 F (36.7 C)] 97.9 F (36.6 C) (11/19 0307) Pulse Rate:  [79-105] 79 (11/19 0307) Resp:  [20-24] 20 (11/19 0307) BP: (101-179)/(63-98) 101/63 (11/19 0307) SpO2:  [94 %-98 %] 98 % (11/19 0307) Weight:  [55.3 kg (122 lb)] 55.3 kg (122 lb) (11/18 2155)  PHYSICAL EXAMINATION:  General Well nourished, well developed, no apparent distress  HEENT No gross abnormalities.   Pulmonary Coarse bilaterally with  absent breath sounds right base. Dullness to percussion R base. Adequate effort, symmetrical expansion.   Cardiovascular Normal rate, regular rhythm. S1, s2. Prominent valve click. No m/r/g. Distal pulses palpable.  Abdomen Soft, non-tender, non-distended, positive bowel sounds, no palpable organomegaly or masses. Normoresonant to percussion.  Musculoskeletal Grossly normal with age-related changes.  Lymphatics No cervical, supraclavicular or axillary adenopathy.   Neurologic Grossly intact. No focal deficits.   Skin/Integuement No rash, no cyanosis, no clubbing. Trace bipedal edema.      Recent Labs Lab 02/12/16 0927 02/16/16 1212 02/17/16 2312  NA 135 137 133*  K 4.1 4.6 4.2  CL 96*  --  92*  CO2 33* 31* 33*  BUN 38* 29.4* 37*  CREATININE 1.01* 0.8 0.88  GLUCOSE 120* 86 110*    Recent Labs Lab 02/12/16 0927 02/16/16 1212 02/17/16 2312  HGB 10.6* 12.5 12.0  HCT 33.1* 38.8 37.4  WBC 4.7 6.4 9.2  PLT 243 268 286   Dg Chest 2 View  Result Date: 02/17/2016 CLINICAL DATA:  Status post lung biopsy. Shortness of breath and hemoptysis. EXAM: CHEST  2 VIEW COMPARISON:  Chest radiograph 02/12/2016 FINDINGS: Unchanged appearance of left chest wall Port-A-Cath with tip in the persistent left-sided SVC. There is aortic atherosclerosis. Median sternotomy wires and right axillary surgical clips are unchanged. Right pleural effusion has increased in size. The left lung is clear. No pneumothorax. No pulmonary edema. IMPRESSION: 1. Increased size of right pleural effusion with associated atelectasis. 2. Aortic atherosclerosis. Electronically Signed   By: Ulyses Jarred M.D.   On: 02/17/2016 22:51    ASSESSMENT / PLAN:  Mrs. Sol is an 82F w/ recently diagnosed extensive stage SCLC and worsening hemoptysis since diagnostic biopsy on 02/07/16. She remains on therapeutic anticoagulation 2/2 mechanical aortic valve.   1) Hemoptysis  Observation for now  Likely source is RUL; if  hemoptysis worsens, could consider IR for embolization  If volume/frequency increases, position patient R side down and hold a/c  Family and patient are clear that quality of life and avoidance of discomfort are primary goals  2) Chronic hypoxemic respiratory failure  Continue supplemental O2 for sats >88%  3) R pleural effusion  Prior thoracentesis without evidence of malignancy, but patient did derive clinical benefit from drainage  Agree with repeat therapeutic thoracentesis  Thank you for the consult.    Yisroel Ramming, MD [facility] [place] Pager: [phone]  02/18/2016, 3:12 AM

## 2016-02-18 NOTE — Procedures (Signed)
Successful US guided right thoracentesis. Yielded 750 mL of clear yellow fluid. Pt tolerated procedure well. No immediate complications.  Specimen was sent for labs. CXR ordered.  Ascencion Dike PA-C 02/18/2016 10:43 AM

## 2016-02-18 NOTE — Progress Notes (Signed)
ANTICOAGULATION CONSULT NOTE - Initial Consult  Pharmacy Consult for Heparin Indication: Mechanical Heart Valve - bridge therapy while Lovenox on hold for procedure  Allergies  Allergen Reactions  . Alendronate Sodium Other (See Comments)    GI PAIN CAUTION: RISK FOR PERFORATION  . Amiodarone Shortness Of Breath  . Chlorthalidone Shortness Of Breath    ABDOMINAL BLOATING  . Macrodantin [Nitrofurantoin] Nausea And Vomiting and Other (See Comments)    CONTRAINDICATED WITH GFR < 50  . Meclizine Swelling    SWELLING OF TONGUE  . Norvasc [Amlodipine Besylate] Swelling    SWELLING REACTION UNSPECIFIED   . Hydralazine Hcl Nausea Only  . Sulfa Antibiotics Other (See Comments)    Unknown  . Tussionex Pennkinetic Er [Hydrocod Polst-Cpm Polst Er] Other (See Comments)    UNSPECIFIED REACTION     Patient Measurements: Height: '5\' 4"'$  (162.6 cm) Weight: 122 lb (55.3 kg) IBW/kg (Calculated) : 54.7 Heparin Dosing Weight:total body weight  Vital Signs: Temp: 98.1 F (36.7 C) (11/19 0003) Temp Source: Oral (11/19 0003) BP: 179/98 (11/19 0003) Pulse Rate: 105 (11/19 0003)  Labs:  Recent Labs  02/16/16 1212 02/17/16 2312  HGB 12.5 12.0  HCT 38.8 37.4  PLT 268 286  CREATININE 0.8 0.88    Estimated Creatinine Clearance: 38.9 mL/min (by C-G formula based on SCr of 0.88 mg/dL).   Medical History: Past Medical History:  Diagnosis Date  . Acute CHF (congestive heart failure) ([place]) 01/10/2016  . Anemia   . Arthritis    "fingers, toes, hips; qwhere" (05/19/2013)  . Breast cancer ([place]) dx'd 1994   "right; S/P lumpectomy, chemo, XRT"   . Chronic lower GI bleeding   . Complication of anesthesia    "once I'm awakened, I don't sleep til the following night" BP drops very low had to stop putting in [place] -a- cath  . GERD (gastroesophageal reflux disease)   . Gout   . Heart murmur   . High cholesterol   . History of blood transfusion 1996; ~ 2013   "related to valve replacement; GI  bleeding"   . History of breast cancer 02/16/2016  . History of echocardiogram    Echo 1/17: mild LVH, EF 50-55%, no RWMA, mild AI, mechanical AVR with mean 21 mmHg/peak 42 mmHg (stable since 2012), mod MR, mod to severe BAE, PASP 36 mmHg  . History of nuclear stress test    Myoview 1/17: EF 59%, normal perfusion, low risk  . Hypertension   . Hypothyroidism   . Lung cancer ([place]) dx'd 01/2016   SCL  . On supplemental oxygen therapy   . Permanent atrial fibrillation ([place])       . Pneumonia 07/2011  . Rectus sheath hematoma 12/19/14    held anticoagulation for 1 week, transfused  . Small cell lung cancer, right ([place]) 02/16/2016  . Stomach problems    seeing Dr Watt Climes  . TIA (transient ischemic attack) 12/24/14   symptoms resolved    Assessment:  50 female patient with PMH significant for mechanical heart valve presents with coughing up blood.  Patient noted to have undergone a lung biopsy this past week.  Patient on Lovenox '60mg'$  sq q12h for mechanical heart valve  Plans for thoracentesis; therefore, Lovenox placed on hold.  Pharmacy consulted to dose IV heparin for bridge therapy while Lovenox on hold  Last dose of Lovenox reported as taken on 02/17/16 @ 8am  Goal of Therapy:  Heparin Level = 0.3-0.7 Monitor platelets by anticoagulation protocol: Yes   Plan:  Begin IV heparin drip @ 800 units/hr  Check heparin level 8 hr after heparin started  Follow heparin level & CBC daily  Keynan Heffern, Toribio Harbour, PharmD 02/18/2016,1:39 AM

## 2016-02-18 NOTE — Progress Notes (Signed)
ANTICOAGULATION CONSULT NOTE - Initial Consult  Pharmacy Consult for Heparin Indication: Mechanical Heart Valve - bridge therapy while Lovenox on hold for procedure  Allergies  Allergen Reactions  . Alendronate Sodium Other (See Comments)    GI PAIN CAUTION: RISK FOR PERFORATION  . Amiodarone Shortness Of Breath  . Chlorthalidone Shortness Of Breath    ABDOMINAL BLOATING  . Macrodantin [Nitrofurantoin] Nausea And Vomiting and Other (See Comments)    CONTRAINDICATED WITH GFR < 50  . Meclizine Swelling    SWELLING OF TONGUE  . Norvasc [Amlodipine Besylate] Swelling    SWELLING REACTION UNSPECIFIED   . Hydralazine Hcl Nausea Only  . Sulfa Antibiotics Other (See Comments)    Unknown  . Tussionex Pennkinetic Er [Hydrocod Polst-Cpm Polst Er] Other (See Comments)    UNSPECIFIED REACTION     Patient Measurements: Height: '5\' 4"'$  (162.6 cm) Weight: 122 lb (55.3 kg) IBW/kg (Calculated) : 54.7 Heparin Dosing Weight:total body weight  Vital Signs: Temp: 97.7 F (36.5 C) (11/19 0604) Temp Source: Oral (11/19 0604) BP: 105/61 (11/19 1039) Pulse Rate: 79 (11/19 0604)  Labs:  Recent Labs  02/16/16 1212 02/17/16 2312 02/18/16 0640  HGB 12.5 12.0 10.9*  HCT 38.8 37.4 34.3*  PLT 268 286 231  CREATININE 0.8 0.88 0.79    Estimated Creatinine Clearance: 42.8 mL/min (by C-G formula based on SCr of 0.79 mg/dL).   Medical History: Past Medical History:  Diagnosis Date  . Acute CHF (congestive heart failure) ([place]) 01/10/2016  . Anemia   . Arthritis    "fingers, toes, hips; qwhere" (05/19/2013)  . Breast cancer ([place]) dx'd 1994   "right; S/P lumpectomy, chemo, XRT"   . Chronic lower GI bleeding   . Complication of anesthesia    "once I'm awakened, I don't sleep til the following night" BP drops very low had to stop putting in [place] -a- cath  . GERD (gastroesophageal reflux disease)   . Gout   . Heart murmur   . High cholesterol   . History of blood transfusion 1996; ~ 2013   "related to valve replacement; GI bleeding"   . History of breast cancer 02/16/2016  . History of echocardiogram    Echo 1/17: mild LVH, EF 50-55%, no RWMA, mild AI, mechanical AVR with mean 21 mmHg/peak 42 mmHg (stable since 2012), mod MR, mod to severe BAE, PASP 36 mmHg  . History of nuclear stress test    Myoview 1/17: EF 59%, normal perfusion, low risk  . Hypertension   . Hypothyroidism   . Lung cancer ([place]) dx'd 01/2016   SCL  . On supplemental oxygen therapy   . Permanent atrial fibrillation ([place])       . Pneumonia 07/2011  . Rectus sheath hematoma 12/19/14    held anticoagulation for 1 week, transfused  . Small cell lung cancer, right ([place]) 02/16/2016  . Stomach problems    seeing Dr Watt Climes  . TIA (transient ischemic attack) 12/24/14   symptoms resolved    Assessment:  6 female patient with PMH significant for mechanical heart valve presents with coughing up blood.  Patient noted to have undergone a lung biopsy this past week.  Patient on Lovenox '60mg'$  sq q12h for mechanical heart valve  Plans for thoracentesis; therefore, Lovenox placed on hold.  Pharmacy consulted to dose IV heparin for bridge therapy while Lovenox on hold  Last dose of Lovenox reported as taken on 02/17/16 @ 8am  Heparin not started prior to procedure.  Spoke with Dr Waldron Labs &  will plan to resume at 5pm tonight post-procedure.   Goal of Therapy:  Heparin Level = 0.3-0.7 Monitor platelets by anticoagulation protocol: Yes   Plan:   Begin IV heparin drip @ 800 units/hr- start at 5pm  Check heparin level 8 hr after heparin started  Follow heparin level & CBC daily  Deborah [name], PharmD, BCPS Pager: [phone] 02/18/2016,12:50 PM

## 2016-02-18 NOTE — Progress Notes (Signed)
  Radiation Oncology         [phone] ________________________________  Name: Deborah [name] MRN: [id number]  Date: 02/16/2016  DOB: 07/31/1926  SIMULATION AND TREATMENT PLANNING NOTE  DIAGNOSIS:     ICD-9-CM ICD-10-CM   1. Malignant neoplasm of hilus of right lung (HCC) 162.2 C34.01      Site:  Right lung  NARRATIVE:  The patient was brought to the [facility].  Identity was confirmed.  All relevant records and images related to the planned course of therapy were reviewed.   Written consent to proceed with treatment was confirmed which was freely given after reviewing the details related to the planned course of therapy had been reviewed with the patient.  Then, the patient was set-up in a stable reproducible  supine position for radiation therapy.  CT images were obtained.  Surface markings were placed.    Medically necessary complex treatment device(s) for immobilization:  N/A.   The CT images were loaded into the planning software.  Then the target and avoidance structures were contoured.  Treatment planning then occurred.  The radiation prescription was entered and confirmed.  A total of 3 complex treatment devices were fabricated which relate to the designed radiation treatment fields. Each of these customized fields/ complex treatment devices will be used on a daily basis during the radiation course. I have requested : 3D Simulation  I have requested a DVH of the following structures: Target volume, spinal cord, lungs, esophagus. The patient had treatment previously to the right breast and dose to this region will be minimized.  The patient will undergo daily image guidance to ensure accurate localization of the target, and adequate minimize dose to the normal surrounding structures in close proximity to the target.   PLAN:  The patient will receive 30 Gy in 10 fractions.  ________________________________   Jodelle Gross, MD, PhD

## 2016-02-19 ENCOUNTER — Telehealth: Payer: Self-pay | Admitting: *Deleted

## 2016-02-19 ENCOUNTER — Ambulatory Visit
Admission: RE | Admit: 2016-02-19 | Discharge: 2016-02-19 | Disposition: A | Discharge status: Home / Self Care | Payer: Medicare Other | Source: Ambulatory Visit | Attending: Radiation Oncology | Admitting: Radiation Oncology

## 2016-02-19 DIAGNOSIS — C3491 Malignant neoplasm of unspecified part of right bronchus or lung: Secondary | ICD-10-CM

## 2016-02-19 DIAGNOSIS — R042 Hemoptysis: Secondary | ICD-10-CM

## 2016-02-19 DIAGNOSIS — C3411 Malignant neoplasm of upper lobe, right bronchus or lung: Secondary | ICD-10-CM

## 2016-02-19 DIAGNOSIS — J910 Malignant pleural effusion: Secondary | ICD-10-CM

## 2016-02-19 LAB — HEPARIN LEVEL (UNFRACTIONATED)
HEPARIN UNFRACTIONATED: 0.64 [IU]/mL (ref 0.30–0.70)
HEPARIN UNFRACTIONATED: 0.69 [IU]/mL (ref 0.30–0.70)
HEPARIN UNFRACTIONATED: 0.45 [IU]/mL (ref 0.30–0.70)

## 2016-02-19 LAB — CBC
HEMATOCRIT: 32.1 % — AB (ref 36.0–46.0)
Hemoglobin: 10.3 g/dL — ABNORMAL LOW (ref 12.0–15.0)
MCH: 32.1 pg (ref 26.0–34.0)
MCHC: 32.1 g/dL (ref 30.0–36.0)
MCV: 100 fL (ref 78.0–100.0)
Platelets: 187 10*3/uL (ref 150–400)
RBC: 3.21 MIL/uL — ABNORMAL LOW (ref 3.87–5.11)
RDW: 16.4 % — AB (ref 11.5–15.5)
WBC: 6.2 10*3/uL (ref 4.0–10.5)

## 2016-02-19 LAB — BASIC METABOLIC PANEL
ANION GAP: 7 (ref 5–15)
BUN: 42 mg/dL — ABNORMAL HIGH (ref 6–20)
CALCIUM: 9.1 mg/dL (ref 8.9–10.3)
CO2: 32 mmol/L (ref 22–32)
CREATININE: 0.92 mg/dL (ref 0.44–1.00)
Chloride: 95 mmol/L — ABNORMAL LOW (ref 101–111)
GFR, EST NON AFRICAN AMERICAN: 54 mL/min — AB (ref >60)
Glucose, Bld: 112 mg/dL — ABNORMAL HIGH (ref 65–99)
Potassium: 3.8 mmol/L (ref 3.5–5.1)
SODIUM: 134 mmol/L — AB (ref 135–145)

## 2016-02-19 MED ORDER — MORPHINE SULFATE (PF) 2 MG/ML IV SOLN: 0.5000 mg | INTRAVENOUS | Status: DC | PRN

## 2016-02-19 MED ORDER — POTASSIUM CHLORIDE CRYS ER 10 MEQ PO TBCR
10.0000 meq | EXTENDED_RELEASE_TABLET | Freq: Two times a day (BID) | ORAL | Status: DC
  Administered 2016-02-19 – 2016-02-22 (×7): 10 meq via ORAL
  Filled 2016-02-19 (×7): qty 1

## 2016-02-19 MED ORDER — FUROSEMIDE 40 MG PO TABS
40.0000 mg | ORAL_TABLET | Freq: Two times a day (BID) | ORAL | Status: DC
  Administered 2016-02-19 – 2016-02-22 (×7): 40 mg via ORAL
  Filled 2016-02-19 (×7): qty 1

## 2016-02-19 NOTE — Addendum Note (Signed)
Encounter addended by: Kyung Rudd, MD on: 02/19/2016 12:03 AM<BR>    Actions taken: Problem List reviewed, Sign clinical note

## 2016-02-19 NOTE — Progress Notes (Signed)
Deborah [name]   DOB:02-02-1927   FW#:[id number]   OYD#:[id number]  Subjective: Deborah [name] is having some pain and significant SOB. They met with the thoracic oncology team and have decided in favor of palliative radiation but against chemotherapy. She would like to get off the lovenox as she no longer can give herself the shots and would prefer to go back on coumadin. Daighter and son in law in room   Objective: elderly White woman sitting on side of bed Vitals:   02/19/16 1432 02/19/16 1633  BP: (!) 102/57 123/66  Pulse: 96 89  Resp:    Temp: 97.6 F (36.4 C)     Body mass index is 20.94 kg/m.  Intake/Output Summary (Last 24 hours) at 02/19/16 1733 Last data filed at 02/19/16 1300  Gross per 24 hour  Intake           402.54 ml  Output                0 ml  Net           402.54 ml     Sclerae unicteric  Lungs BS on right, rales on left  Heart regular rate and rhythm  Neuro nonfocal, well-oriented, appropriate affect   CBG (last 3)  No results for input(s): GLUCAP in the last 72 hours.   Labs:  Lab Results  Component Value Date   WBC 6.2 02/19/2016   HGB 10.3 (L) 02/19/2016   HCT 32.1 (L) 02/19/2016   MCV 100.0 02/19/2016   PLT 187 02/19/2016   NEUTROABS 7.8 (H) 02/17/2016    @LASTCHEMISTRY @  Urine Studies No results for input(s): UHGB, CRYS in the last 72 hours.  Invalid input(s): UACOL, UAPR, USPG, UPH, UTP, UGL, UKET, UBIL, UNIT, UROB, [place], UEPI, UWBC, [name], [place]  Basic Metabolic Panel:  Recent Labs Lab 02/16/16 1212  02/17/16 2312 02/18/16 0640 02/19/16 0223  NA 137  --  133* 134* 134*  K 4.6  < > 4.2 4.1 3.8  CL  --   --  92* 92* 95*  CO2 31*  --  33* 36* 32  GLUCOSE 86  --  110* 95 112*  BUN 29.4*  --  37* 38* 42*  CREATININE 0.8  --  0.88 0.79 0.92  CALCIUM 10.6*  --  10.0 9.5 9.1  < > = values in this interval not displayed. GFR Estimated Creatinine Clearance: 37.2 mL/min (by C-G formula based on SCr of 0.92 mg/dL). Liver  Function Tests:  Recent Labs Lab 02/16/16 1212  AST 20  ALT 26  ALKPHOS 55  BILITOT 0.66  PROT 6.4  ALBUMIN 3.6   No results for input(s): LIPASE, AMYLASE in the last 168 hours. No results for input(s): AMMONIA in the last 168 hours. Coagulation profile No results for input(s): INR, PROTIME in the last 168 hours.  CBC:  Recent Labs Lab 02/16/16 1212 02/17/16 2312 02/18/16 0640 02/19/16 0223  WBC 6.4 9.2 6.3 6.2  NEUTROABS 5.3 7.8*  --   --   HGB 12.5 12.0 10.9* 10.3*  HCT 38.8 37.4 34.3* 32.1*  MCV 100.9 101.9* 103.0* 100.0  PLT 268 286 231 187   Cardiac Enzymes: No results for input(s): CKTOTAL, CKMB, CKMBINDEX, TROPONINI in the last 168 hours. BNP: Invalid input(s): POCBNP CBG: No results for input(s): GLUCAP in the last 168 hours. D-Dimer No results for input(s): DDIMER in the last 72 hours. Hgb A1c No results for input(s): HGBA1C in the last 72 hours. Lipid  Profile No results for input(s): CHOL, HDL, LDLCALC, TRIG, CHOLHDL, LDLDIRECT in the last 72 hours. Thyroid function studies No results for input(s): TSH, T4TOTAL, T3FREE, THYROIDAB in the last 72 hours.  Invalid input(s): FREET3 Anemia work up No results for input(s): VITAMINB12, FOLATE, FERRITIN, TIBC, IRON, RETICCTPCT in the last 72 hours. Microbiology No results found for this or any previous visit (from the past 240 hour(s)).    Studies:  Dg Chest 1 View  Result Date: 02/18/2016 CLINICAL DATA:  Right thoracentesis. EXAM: CHEST 1 VIEW COMPARISON:  Chest x-ray from yesterday FINDINGS: Diminished right pleural effusion. No pneumothorax. Known right hilar mass and lower lobe atelectasis. Cardiopericardial enlargement. Porta catheter on the left with tip at a left SVC. IMPRESSION: 1. No acute finding after right thoracentesis. 2. Right basilar atelectasis and known right hilar mass. Electronically Signed   By: Monte Fantasia M.D.   On: 02/18/2016 11:12   Dg Chest 2 View  Result Date:  02/17/2016 CLINICAL DATA:  Status post lung biopsy. Shortness of breath and hemoptysis. EXAM: CHEST  2 VIEW COMPARISON:  Chest radiograph 02/12/2016 FINDINGS: Unchanged appearance of left chest wall Port-A-Cath with tip in the persistent left-sided SVC. There is aortic atherosclerosis. Median sternotomy wires and right axillary surgical clips are unchanged. Right pleural effusion has increased in size. The left lung is clear. No pneumothorax. No pulmonary edema. IMPRESSION: 1. Increased size of right pleural effusion with associated atelectasis. 2. Aortic atherosclerosis. Electronically Signed   By: Ulyses Jarred M.D.   On: 02/17/2016 22:51   Ct Angio Chest Aorta W/cm &/or Wo/cm  Result Date: 02/18/2016 CLINICAL DATA:  Hemoptysis. Mechanical bowel. Considering empiric bronchial embolization. EXAM: CT ANGIOGRAPHY CHEST WITH CONTRAST TECHNIQUE: Multidetector CT imaging of the chest was performed using the standard protocol during bolus administration of intravenous contrast. Multiplanar CT image reconstructions and MIPs were obtained to evaluate the vascular anatomy. CONTRAST:  100 cc Isovue 370 intravenous COMPARISON:  02/12/2016 FINDINGS: Cardiovascular: Chronic cardiomegaly. The atria are particularly enlarged. Mechanical aortic valve without para valvular collection. Aneurysmal ascending aorta measuring up to 49 mm diameter. Aberrant right subclavian artery with retroesophageal course. There is a persistent left SVC with indwelling porta catheter. Two bronchial arteries are seen and are not hyper trophic. The more proximal has its origin near the aberrant left subclavian artery ostium. No pseudoaneurysm or active hemorrhage seen within the patient's malignancy. Pulmonary hypertension. No identified acute pulmonary embolism Mediastinum/Nodes: Known right hilar malignancy with airway obstruction. Necrosis present in right paratracheal lymph nodes. Lungs/Pleura: Material cast the right lower lobe and middle  lobe airways with right lower lobe collapse. In this setting, this may reflect hemorrhage. Clusters of micronodules in the right upper lobe and left upper lobe that are chronic. No left-sided source of hemorrhage identified. Mild interstitial coarsening and ground-glass opacity in the right upper lobe is likely from vascular/lymphatic obstruction Upper Abdomen: No acute finding. Polycystic kidneys. Known left adrenal mass that is hypermetabolic by CT. Musculoskeletal: L1-2 advanced disc disease with sclerosis and subchondral cysts, degenerative appearing. No acute or aggressive finding. Review of the MIP images confirms the above findings. IMPRESSION: 1. Two non hypertrophied bronchial arteries identified. 2. Known right upper lobe malignancy with airway obstruction and bronchial artery/vein narrowing. Material cast the right lower lobe and right middle lobe airways, new from 01/25/2016, presumably clot in this setting. The right lower lobe is completely collapsed. 3. Chronic mild bronchiolitis changes in the left upper lobe. No left-sided source of hemoptysis identified. 4. 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 Electronically Signed   By: Monte Fantasia M.D.   On: 02/18/2016 12:32   US Thoracentesis Asp Pleural Space W/img Guide  Result Date: 02/18/2016 INDICATION: Lung cancer. Shortness of breath. Right-sided pleural effusion. Request diagnostic and therapeutic thoracentesis. EXAM: ULTRASOUND GUIDED RIGHT THORACENTESIS MEDICATIONS: None. COMPLICATIONS: None immediate. Postprocedural chest x-ray negative for pneumothorax. PROCEDURE: An ultrasound guided thoracentesis was thoroughly discussed with the patient and questions answered. The benefits, risks, alternatives and complications were also discussed. The patient understands and wishes to proceed with the procedure. Written consent was obtained. Ultrasound was performed to localize and mark an adequate pocket of fluid in the right  chest. The area was then prepped and draped in the normal sterile fashion. 1% Lidocaine was used for local anesthesia. Under ultrasound guidance a Safe-T-Centesis catheter was introduced. Thoracentesis was performed. The catheter was removed and a dressing applied. FINDINGS: A total of approximately 750 mL of clear yellow fluid was removed. Samples were sent to the laboratory as requested by the clinical team. IMPRESSION: Successful ultrasound guided right thoracentesis yielding 750 mL of pleural fluid. Read by: Ascencion Dike PA-C Electronically Signed   By: Corrie Mckusick D.O.   On: 02/18/2016 11:00    Assessment: 82 y.o. [place] with iron deficiency anemia secondary to chronic blood loss, s/p oral iron supplementation with poor tolerance and inadequate response  (1) Feraheme started 07/24/2012, repeated whenever ferritin drops <100, mosr recent dose 07/04/2015  (2) transfusing PBBCs for Hb < 9.0 or symptoms  (3) chronic GIB extensively evaluated by GI with findings including AVM (cauterized), diverticular disease, hemorrhoids and gastric polyps  (4) on lifelong anticoagulation for AFib and mechanical aortic valve             (a) on warfarin until June 2016, when she was switched to Lovenox             (b) Lovenox discontinued September 2016 after rectus sheath hematoma developed             (c) warfarin resumed 12/27/2014 goal being an INR between 2.0 and 2.5  LUNG CANCER (5) CT chest and PET scan October 2017 sure 4.1 cm right upper lobe lung mass confluent with right hilar and paratracheal adenopathy, with a left adrenal nodule and evidence of postobstructive pneumonitis. There is a small A right pleural effusion             (a) cytology from bronchoscopy 02/07/2016 confirmed small cell lung cancer  (b) palliative radiation in process  Plan:  Met briefly with the patient and her family. "Deborah [name]" is very clear her first priority is she does not want to hurt. She is willing to undergo  radiation as planned as it may help her breather better. She has decided against chemotherapy and once the radiation is completed we will place a Hospice referral.  I have stopped her cholesterol medications. She may need a transfusion tomorrow (if Hb <10) so have added a type and screen.   Patient would prefer to go back on coumadin. If a decision is made against bronchoscopy or other procedures this could be started under cover of lovenox and we can follow as outpatient.  Greatly appreciate your help to this patient and her family   [name], MD 02/19/2016  5:33 PM Medical Oncology and Hematology [facility] [address] [place], [place] [address] Tel. [phone]    Fax. [phone]

## 2016-02-19 NOTE — Progress Notes (Signed)
Name: Deborah [name] MRN: [id number] DOB: October 06, 1926    ADMISSION DATE:  02/17/2016 CONSULTATION DATE:  02/18/16  REFERRING MD :  Elgergawy  CHIEF COMPLAINT:  hemoptysis  BRIEF PATIENT DESCRIPTION: 10F w/ recently dx extensive stage SCLC, hx CHF, prior breast ca, GERD, mechanical aortic valve on a/c, HLD, HTN, hypothyroidism presenting with worsening hemoptysis (1 Tbs every 1-2 h) x 24h  SIGNIFICANT EVENTS    STUDIES:  CXR 02/17/16 R effusion; otherwise largely clear.    SUBJECTIVE:  No distress.  VITAL SIGNS: Temp:  [97.9 F (36.6 C)-98.6 F (37 C)] 98.6 F (37 C) (11/20 0404) Pulse Rate:  [72-98] 72 (11/20 0853) Resp:  [18-19] 19 (11/20 0404) BP: (102-136)/(49-62) 117/49 (11/20 0853) SpO2:  [93 %-98 %] 96 % (11/20 0853)  PHYSICAL EXAMINATION:  General Well nourished, well developed, no apparent distress  HEENT No gross abnormalities.   Pulmonary Decreased right base. No accessory use   Cardiovascular Normal rate, regular rhythm. S1, s2. Prominent valve click. No m/r/g. Distal pulses palpable.  Abdomen Soft, non-tender, non-distended, positive bowel sounds, no palpable organomegaly or masses. Normoresonant to percussion.  Musculoskeletal Grossly normal with age-related changes.  Lymphatics No cervical, supraclavicular or axillary adenopathy.   Neurologic Grossly intact. No focal deficits.   Skin/Integuement No rash, no cyanosis, no clubbing. Trace bipedal edema.      Recent Labs Lab 02/17/16 2312 02/18/16 0640 02/19/16 0223  NA 133* 134* 134*  K 4.2 4.1 3.8  CL 92* 92* 95*  CO2 33* 36* 32  BUN 37* 38* 42*  CREATININE 0.88 0.79 0.92  GLUCOSE 110* 95 112*    Recent Labs Lab 02/17/16 2312 02/18/16 0640 02/19/16 0223  HGB 12.0 10.9* 10.3*  HCT 37.4 34.3* 32.1*  WBC 9.2 6.3 6.2  PLT 286 231 187   Dg Chest 1 View  Result Date: 02/18/2016 CLINICAL DATA:  Right thoracentesis. EXAM: CHEST 1 VIEW COMPARISON:  Chest x-ray from yesterday FINDINGS:  Diminished right pleural effusion. No pneumothorax. Known right hilar mass and lower lobe atelectasis. Cardiopericardial enlargement. Porta catheter on the left with tip at a left SVC. IMPRESSION: 1. No acute finding after right thoracentesis. 2. Right basilar atelectasis and known right hilar mass. Electronically Signed   By: Monte Fantasia M.D.   On: 02/18/2016 11:12   Dg Chest 2 View  Result Date: 02/17/2016 CLINICAL DATA:  Status post lung biopsy. Shortness of breath and hemoptysis. EXAM: CHEST  2 VIEW COMPARISON:  Chest radiograph 02/12/2016 FINDINGS: Unchanged appearance of left chest wall Port-A-Cath with tip in the persistent left-sided SVC. There is aortic atherosclerosis. Median sternotomy wires and right axillary surgical clips are unchanged. Right pleural effusion has increased in size. The left lung is clear. No pneumothorax. No pulmonary edema. IMPRESSION: 1. Increased size of right pleural effusion with associated atelectasis. 2. Aortic atherosclerosis. Electronically Signed   By: Ulyses Jarred M.D.   On: 02/17/2016 22:51   Ct Angio Chest Aorta W/cm &/or Wo/cm  Result Date: 02/18/2016 CLINICAL DATA:  Hemoptysis. Mechanical bowel. Considering empiric bronchial embolization. EXAM: CT ANGIOGRAPHY CHEST WITH CONTRAST TECHNIQUE: Multidetector CT imaging of the chest was performed using the standard protocol during bolus administration of intravenous contrast. Multiplanar CT image reconstructions and MIPs were obtained to evaluate the vascular anatomy. CONTRAST:  100 cc Isovue 370 intravenous COMPARISON:  02/12/2016 FINDINGS: Cardiovascular: Chronic cardiomegaly. The atria are particularly enlarged. Mechanical aortic valve without para valvular collection. Aneurysmal ascending aorta measuring up to 49 mm diameter. Aberrant right subclavian artery  with retroesophageal course. There is a persistent left SVC with indwelling porta catheter. Two bronchial arteries are seen and are not hyper trophic.  The more proximal has its origin near the aberrant left subclavian artery ostium. No pseudoaneurysm or active hemorrhage seen within the patient's malignancy. Pulmonary hypertension. No identified acute pulmonary embolism Mediastinum/Nodes: Known right hilar malignancy with airway obstruction. Necrosis present in right paratracheal lymph nodes. Lungs/Pleura: Material cast the right lower lobe and middle lobe airways with right lower lobe collapse. In this setting, this may reflect hemorrhage. Clusters of micronodules in the right upper lobe and left upper lobe that are chronic. No left-sided source of hemorrhage identified. Mild interstitial coarsening and ground-glass opacity in the right upper lobe is likely from vascular/lymphatic obstruction Upper Abdomen: No acute finding. Polycystic kidneys. Known left adrenal mass that is hypermetabolic by CT. Musculoskeletal: L1-2 advanced disc disease with sclerosis and subchondral cysts, degenerative appearing. No acute or aggressive finding. Review of the MIP images confirms the above findings. IMPRESSION: 1. Two non hypertrophied bronchial arteries identified. 2. Known right upper lobe malignancy with airway obstruction and bronchial artery/vein narrowing. Material cast the right lower lobe and right middle lobe airways, new from 01/25/2016, presumably clot in this setting. The right lower lobe is completely collapsed. 3. Chronic mild bronchiolitis changes in the left upper lobe. No left-sided source of hemoptysis identified. 4. Ascending aortic aneurysm with aberrant right subclavian artery. Electronically Signed   By: Monte Fantasia M.D.   On: 02/18/2016 12:32   US Thoracentesis Asp Pleural Space W/img Guide  Result Date: 02/18/2016 INDICATION: Lung cancer. Shortness of breath. Right-sided pleural effusion. Request diagnostic and therapeutic thoracentesis. EXAM: ULTRASOUND GUIDED RIGHT THORACENTESIS MEDICATIONS: None. COMPLICATIONS: None immediate. Postprocedural  chest x-ray negative for pneumothorax. PROCEDURE: An ultrasound guided thoracentesis was thoroughly discussed with the patient and questions answered. The benefits, risks, alternatives and complications were also discussed. The patient understands and wishes to proceed with the procedure. Written consent was obtained. Ultrasound was performed to localize and mark an adequate pocket of fluid in the right chest. The area was then prepped and draped in the normal sterile fashion. 1% Lidocaine was used for local anesthesia. Under ultrasound guidance a Safe-T-Centesis catheter was introduced. Thoracentesis was performed. The catheter was removed and a dressing applied. FINDINGS: A total of approximately 750 mL of clear yellow fluid was removed. Samples were sent to the laboratory as requested by the clinical team. IMPRESSION: Successful ultrasound guided right thoracentesis yielding 750 mL of pleural fluid. Read by: Ascencion Dike PA-C Electronically Signed   By: Corrie Mckusick D.O.   On: 02/18/2016 11:00  RLL ATX tumor in right bronchus.   ASSESSMENT / PLAN:  Deborah [name] is an 83F w/ recently diagnosed extensive stage SCLC and worsening hemoptysis since diagnostic biopsy on 02/07/16. She remains on therapeutic anticoagulation 2/2 mechanical aortic valve.  She was restarted on heparin last night. Has only coughed up less than a tablespoon of older appearing blood. No bright red blood. For now I would cont current rx: observation, heparin so that we can easily stop AC, start XRT and continue a wait and see approach. Possibly this should improve w/ out any intervention. If bleeding worse we could consider bronch or angiogram. Defer definitive plan to Dr Ashok Cordia.   1) Hemoptysis:  Likely source is RUL-->now only see old blood. Moderate amt.  - cont observational conservative approach for now. Hopefully w/ time and XRT bleeding will continue to subside.  - If volume/frequency increases, position  patient R side down and  hold a/c -  if hemoptysis worsens, could consider FOB to see if site can be localized and possibly injected w/ epi. We also have IR following for possible embolization   2) Chronic hypoxemic respiratory failure - Continue supplemental O2 for sats >88%  3) R pleural effusion-->s/p repeat thora 11/19 -F/u cytology   4) Right lower lobe atelectasis: likely from aspirated blood - cont pulm hygiene   Erick Colace ACNP-BC [facility] Pager # [phone] OR # [phone] if no answer     02/19/2016, 10:41 AM

## 2016-02-19 NOTE — Progress Notes (Signed)
PROGRESS NOTE                                                                                                                                                                                                             Patient Demographics:    Deborah [name], is a 82 y.o. female, DOB - March 13, 1927, HBZ:[id number]  Admit date - 02/17/2016   Admitting Physician Etta Quill, DO  Outpatient Primary MD for the patient is Mathews Argyle, MD  LOS - 1  Outpatient Specialists: Oncology Dr Jana Hakim.  Chief Complaint  Patient presents with  . Cough    up blood       Brief Narrative  82 y.o. female with medical history significant of mechanical valve replacement on chronic lovenox, patient just had endobronchial biopsy for lung mass performed on 11/8 by Dr. Lamonte Sakai, this has come back showing SCLC of the lung, initial oncology visit 2 days ago with Dr. Julien Nordmann.  Patient presents to the ED with c/o SOB and hemoptysis   Subjective:    Deborah [name] today has, No headache, No chest pain, No abdominal pain -She reports dyspnea, Small amount of hemoptysis started yesterday evening after initiation of heparin GTT.  Assessment  & Plan :    Principal Problem:   Hemoptysis Active Problems:   Pleural effusion on right   Small cell lung cancer, right (HCC)   Dyspnea   Hemoptysis - In the setting of SCLC , with recent biopsy on anticoagulation for mechanical valve - IR and [facility] M input greatly appreciated, possible need for embolization by IR, CTA chest obtained - Monitor CBC closely. - on  Lovenox, start on heparin GTT for anticoagulation, continue to monitor for hemoptysis, if recurrence of symptoms will need embolization by IR .  Right pleural effusion - Status post thoracentesis 11/19, 750 mL drained, follow on cytology, prior thoracentesis without evidence of malignancy  Anemia of chronic blood loss - This is managed by Dr. Jana Hakim as outpatient,  transfusion by hematology service when  Appropriate.  SCLC - Recently diagnosed, Started palliative radiation today.  Mechanical valve - On Lovenox At home, and on heparin GTT given hemoptysis   Code Status : DNR  Family Communication  : Daughter at bedside  Disposition Plan  : home when stable  Consults  :  IR, PCCm  Procedures  :  Thoracentesis 11/19  DVT Prophylaxis  :  Heparin GTT  Lab Results  Component Value Date   PLT 187 02/19/2016    Antibiotics  :    Anti-infectives    None        Objective:   Vitals:   02/19/16 0404 02/19/16 0853 02/19/16 1053 02/19/16 1432  BP: 136/62 (!) 117/49 124/68 (!) 102/57  Pulse: 89 72 80 96  Resp: 19     Temp: 98.6 F (37 C)   97.6 F (36.4 C)  TempSrc: Oral   Axillary  SpO2: 98% 96% 95% 97%  Weight:      Height:        Wt Readings from Last 3 Encounters:  02/17/16 55.3 kg (122 lb)  02/16/16 55.3 kg (122 lb)  02/16/16 56 kg (123 lb 8 oz)     Intake/Output Summary (Last 24 hours) at 02/19/16 1523 Last data filed at 02/19/16 1300  Gross per 24 hour  Intake           402.54 ml  Output                5 ml  Net           397.54 ml     Physical Exam  Awake Alert, Oriented X 3, No new F.N deficits, Normal affect [place].AT,PERRAL Supple Neck,No JVD, d.  Symmetrical Chest wall movement, diminished air entry bilaterally R>L, no wheezing RRR,No Gallops,Rubs , + valve click, No Parasternal Heave +ve B.Sounds, Abd Soft, No tenderness,  No Cyanosis, Clubbing or edema, No new Rash or bruise      Data Review:    CBC  Recent Labs Lab 02/16/16 1212 02/17/16 2312 02/18/16 0640 02/19/16 0223  WBC 6.4 9.2 6.3 6.2  HGB 12.5 12.0 10.9* 10.3*  HCT 38.8 37.4 34.3* 32.1*  PLT 268 286 231 187  MCV 100.9 101.9* 103.0* 100.0  MCH 32.4 32.7 32.7 32.1  MCHC 32.1 32.1 31.8 32.1  RDW 18.8* 16.5* 16.8* 16.4*  LYMPHSABS 0.7* 0.7  --   --   MONOABS 0.5 0.7  --   --   EOSABS 0.0 0.0  --   --   BASOSABS 0.0 0.0  --   --      Chemistries   Recent Labs Lab 02/16/16 1212 02/17/16 2312 02/18/16 0640 02/19/16 0223  NA 137 133* 134* 134*  K 4.6 4.2 4.1 3.8  CL  --  92* 92* 95*  CO2 31* 33* 36* 32  GLUCOSE 86 110* 95 112*  BUN 29.4* 37* 38* 42*  CREATININE 0.8 0.88 0.79 0.92  CALCIUM 10.6* 10.0 9.5 9.1  AST 20  --   --   --   ALT 26  --   --   --   ALKPHOS 55  --   --   --   BILITOT 0.66  --   --   --    ------------------------------------------------------------------------------------------------------------------ No results for input(s): CHOL, HDL, LDLCALC, TRIG, CHOLHDL, LDLDIRECT in the last 72 hours.  Lab Results  Component Value Date   HGBA1C 5.4 12/26/2014   ------------------------------------------------------------------------------------------------------------------ No results for input(s): TSH, T4TOTAL, T3FREE, THYROIDAB in the last 72 hours.  Invalid input(s): FREET3 ------------------------------------------------------------------------------------------------------------------ No results for input(s): VITAMINB12, FOLATE, FERRITIN, TIBC, IRON, RETICCTPCT in the last 72 hours.  Coagulation profile No results for input(s): INR, PROTIME in the last 168 hours.  No results for input(s): DDIMER in the last 72 hours.  Cardiac Enzymes No results for input(s): CKMB, TROPONINI,  MYOGLOBIN in the last 168 hours.  Invalid input(s): CK ------------------------------------------------------------------------------------------------------------------    Component Value Date/Time   BNP 249.4 (H) 02/12/2016 0927   BNP 331.2 (H) 11/23/2015 1522    Inpatient Medications  Scheduled Meds: . allopurinol  300 mg Oral QHS  . carvedilol  6.25 mg Oral BID WC  . cloNIDine  0.1 mg Oral Daily  . dexamethasone  8 mg Oral BID WC  . furosemide  40 mg Oral BID  . irbesartan  300 mg Oral Daily  . levothyroxine  88 mcg Oral QAC breakfast  . loratadine  10 mg Oral Daily  . potassium chloride SA   10 mEq Oral BID  . simvastatin  10 mg Oral Daily  . zolpidem  5 mg Oral QHS   Continuous Infusions: . heparin 750 Units/hr (02/19/16 1057)   PRN Meds:.acetaminophen, bisacodyl, celecoxib, dicyclomine, hydrOXYzine, iopamidol, LORazepam, sodium chloride flush, traMADol  Micro Results No results found for this or any previous visit (from the past 240 hour(s)).  Radiology Reports Dg Chest 1 View  Result Date: 02/18/2016 CLINICAL DATA:  Right thoracentesis. EXAM: CHEST 1 VIEW COMPARISON:  Chest x-ray from yesterday FINDINGS: Diminished right pleural effusion. No pneumothorax. Known right hilar mass and lower lobe atelectasis. Cardiopericardial enlargement. Porta catheter on the left with tip at a left SVC. IMPRESSION: 1. No acute finding after right thoracentesis. 2. Right basilar atelectasis and known right hilar mass. Electronically Signed   By: Monte Fantasia M.D.   On: 02/18/2016 11:12   Dg Chest 2 View  Result Date: 02/17/2016 CLINICAL DATA:  Status post lung biopsy. Shortness of breath and hemoptysis. EXAM: CHEST  2 VIEW COMPARISON:  Chest radiograph 02/12/2016 FINDINGS: Unchanged appearance of left chest wall Port-A-Cath with tip in the persistent left-sided SVC. There is aortic atherosclerosis. Median sternotomy wires and right axillary surgical clips are unchanged. Right pleural effusion has increased in size. The left lung is clear. No pneumothorax. No pulmonary edema. IMPRESSION: 1. Increased size of right pleural effusion with associated atelectasis. 2. Aortic atherosclerosis. Electronically Signed   By: Ulyses Jarred M.D.   On: 02/17/2016 22:51   Dg Chest 2 View  Result Date: 02/12/2016 CLINICAL DATA:  Hemoptysis.  Shortness of breath. EXAM: CHEST  2 VIEW COMPARISON:  01/11/2016 FINDINGS: Left Port-A-Cath is in place, unchanged with the tip in the persistent left-sided SVC. Prior median sternotomy. There is cardiomegaly. Increasing right perihilar and right basilar airspace  disease. No confluent opacity on the left. Small right pleural effusion. IMPRESSION: Increasing right perihilar and right lower lobe airspace opacities concerning for pneumonia. Followup PA and lateral chest X-ray is recommended in 3-4 weeks following trial of antibiotic therapy to ensure resolution and exclude underlying malignancy. Small right pleural effusion. Electronically Signed   By: Rolm Baptise M.D.   On: 02/12/2016 09:51   Ct Head W Wo Contrast  Result Date: 02/12/2016 CLINICAL DATA:  Metastatic lung cancer.  Staging. EXAM: CT HEAD WITHOUT AND WITH CONTRAST TECHNIQUE: Contiguous axial images were obtained from the base of the skull through the vertex without and with intravenous contrast CONTRAST:  29m ISOVUE-300 IOPAMIDOL (ISOVUE-300) INJECTION 61% COMPARISON:  CT head 12/26/2014 FINDINGS: Brain: Mild atrophy. Ventricle size within normal limits. Negative for acute infarct. Negative for hemorrhage or mass. Postcontrast imaging demonstrates normal enhancement. No enhancing mass lesion identified. Vascular: Normal arterial and venous enhancement. Skull: Negative Sinuses/Orbits: Mucosal thickening and opacification of the right sphenoid sinus. No orbital lesion. Other: None IMPRESSION: Negative for metastatic disease to  the brain Right sphenoid sinus chronic opacification. Electronically Signed   By: Franchot Gallo M.D.   On: 02/12/2016 11:45   Ct Chest W Contrast  Result Date: 02/12/2016 CLINICAL DATA:  New diagnosis small cell lung cancer.  Hemoptysis. EXAM: CT CHEST WITH CONTRAST TECHNIQUE: Multidetector CT imaging of the chest was performed during intravenous contrast administration. CONTRAST:  8m ISOVUE-300 IOPAMIDOL (ISOVUE-300) INJECTION 61% COMPARISON:  01/10/2016 FINDINGS: Cardiovascular: Mild aneurysmal dilatation of the ascending thoracic aorta, 4.4 cm, stable since prior study. Mild cardiomegaly. Diffuse aortic calcifications. No dissection. There is a persistent left SVC noted.  Port-A-Cath is in place with the tip in the persistent left SVC. Retroesophageal right subclavian artery again noted. Mediastinum/Nodes: Necrotic appearing right paratracheal lymph nodes, enlarged since prior study. Index node on image 50 measures 2.7 x 2.9 cm compared to 2.5 x 2.7 cm previously. Right peritracheal adenopathy on image 41 measures 3.1 x 2.5 cm compared to 2.7 x 2.0 cm previously. Lungs/Pleura: Large right hilar soft tissue mass noted, enlarging since prior study, measuring 5.4 x 3.8 cm compared with 4.2 x 3.6 cm previously. This mass is partially necrotic. Moderate right pleural effusion is similar to prior study. No left effusion. Airspace disease noted in the right upper lobe likely reflects postobstructive pneumonitis. Compressive atelectasis noted in the right lower lobe. Upper Abdomen: 16 mm nodule in the left adrenal gland appearance likely new since prior CT abdomen 08/05/2015. Multiple renal and hepatic cysts are noted. Musculoskeletal: No visible focal bone lesion or acute bony abnormality. IMPRESSION: Enlarging right hilar mass with ground-glass opacities throughout the right upper lobe, likely postobstructive pneumonitis. Enlarging necrotic appearing mediastinal adenopathy. Stable moderate right pleural effusion. Cardiomegaly. Stable mild aneurysmal dilatation of the ascending thoracic aorta. Apparent new small nodule in the left adrenal gland. Cannot exclude metastasis. Electronically Signed   By: KRolm BaptiseM.D.   On: 02/12/2016 11:48   Nm Pet Image Initial (pi) Skull Base To Thigh  Result Date: 01/25/2016 CLINICAL DATA:  Initial treatment strategy for right upper lobe lung mass detected on recent chest CT angiogram performed in the setting of chest tightness and shortness of breath. History of breast cancer. EXAM: NUCLEAR MEDICINE PET SKULL BASE TO THIGH TECHNIQUE: 5.8 mCi F-18 FDG was injected intravenously. Full-ring PET imaging was performed from the skull base to thigh after  the radiotracer. CT data was obtained and used for attenuation correction and anatomic localization. FASTING BLOOD GLUCOSE:  Value: 109 mg/dl COMPARISON:  01/10/2016 chest CT angiogram. 08/05/2015 CT abdomen/pelvis. FINDINGS: NECK No hypermetabolic lymph nodes in the neck. CHEST Hypermetabolic central right upper lobe 5.1 x 3.7 cm lung mass with max SUV 15.4 (series 8/image 29), which encases and occludes the associated segmental right upper lobe bronchus, and which is confluent with hypermetabolic right hilar and right lower paratracheal adenopathy. Patchy ground-glass opacity in the anterior right upper lobe is most consistent with postobstructive pneumonitis. Separate sharply marginated focus of subpleural reticulation in the anterior right upper lobe is consistent with mild radiation fibrosis. Mild patchy tree-in-bud opacities in the anterior left upper lobe with associated low level metabolism (max SUV 2.7), stable since 01/10/2016, favor mild infectious or inflammatory bronchiolitis. No additional significant pulmonary nodules. Small layering right pleural effusion. No left pleural effusion. No pleural nodularity or hypermetabolism. Hypermetabolic right paratracheal adenopathy, for example a 2.7 cm right lower paratracheal node with max SUV 13.6 (series 4/image 50). No contralateral mediastinal or contralateral hilar hypermetabolic adenopathy. Surgical clips are again noted in the right axilla.  No hypermetabolic axillary lymph nodes. Duplicated superior vena cava. Left internal jugular MediPort terminates within the lower third of the left superior vena cava. Moderate cardiomegaly. Left main, left anterior descending, left circumflex and right coronary atherosclerosis. Stable atherosclerotic aneurysmal ascending thoracic aorta, maximum diameter 4.8 cm. Aberrant nonaneurysmal right subclavian artery arising from the distal aortic arch with retroesophageal course. Stable prominently dilated main pulmonary artery  (4.7 cm diameter). ABDOMEN/PELVIS Left adrenal 1.7 cm hypermetabolic nodule with max SUV 12.0 (series 4/image 95), new since 08/05/2015. No right adrenal hypermetabolism or nodularity. Focus of hypermetabolism in the lateral left lower ventral abdominal muscle wall with associated vague low-attenuation 1.4 cm lesion on the CT images with max SUV 7.1 (series 4/image 123). Focal hypermetabolism at the anorectal junction with max SUV 6.6, without discrete mass or definite wall thickening on the CT images. No abnormal hypermetabolic activity within the liver, pancreas, or spleen. No hypermetabolic lymph nodes in the abdomen or pelvis. Cholecystectomy. Atherosclerotic nonaneurysmal abdominal aorta. Grossly stable enlarged polycystic kidneys with scattered subcentimeter hyperdense renal cortical lesions throughout both kidneys, too small to characterize. Largest simple renal cyst measures 6.0 cm in the far lower right kidney and 7.4 cm in the lower left kidney. Hysterectomy. SKELETON No focal hypermetabolic activity to suggest skeletal metastasis. Sternotomy wires appear aligned and intact. IMPRESSION: 1. Hypermetabolic central right upper lobe 4.1 cm lung mass, which is confluent with hypermetabolic right hilar and right paratracheal adenopathy. Hypermetabolic left adrenal nodule. Favor stage IV (T2b N2 M1b) primary bronchogenic carcinoma. 2. Possible small soft tissue metastasis in the lateral left lower ventral abdominal muscle wall. 3. Nonspecific focal hypermetabolism at the anorectal junction without definite CT correlate, favor physiologic uptake, although a neoplasm cannot be excluded and clinical correlation is necessary. 4. Mild patchy tree-in-bud opacity in the anterior left upper lobe with associated low level metabolism, favor mild infectious or inflammatory bronchiolitis. 5. Postobstructive pneumonitis in the right upper lobe. 6. Cardiomegaly. Small layering right pleural effusion. No appreciable pleural  nodularity or hypermetabolism . 7. Duplicated SVC. Aberrant right subclavian artery. Aortic atherosclerosis. Stable ascending thoracic aortic aneurysm, maximum diameter 4.8 cm. Stable prominently dilated main pulmonary artery suggesting chronic pulmonary arterial hypertension. 8. Polycystic kidneys. Electronically Signed   By: Ilona Sorrel M.D.   On: 01/25/2016 16:00   Ct Angio Chest Aorta W/cm &/or Wo/cm  Result Date: 02/18/2016 CLINICAL DATA:  Hemoptysis. Mechanical bowel. Considering empiric bronchial embolization. EXAM: CT ANGIOGRAPHY CHEST WITH CONTRAST TECHNIQUE: Multidetector CT imaging of the chest was performed using the standard protocol during bolus administration of intravenous contrast. Multiplanar CT image reconstructions and MIPs were obtained to evaluate the vascular anatomy. CONTRAST:  100 cc Isovue 370 intravenous COMPARISON:  02/12/2016 FINDINGS: Cardiovascular: Chronic cardiomegaly. The atria are particularly enlarged. Mechanical aortic valve without para valvular collection. Aneurysmal ascending aorta measuring up to 49 mm diameter. Aberrant right subclavian artery with retroesophageal course. There is a persistent left SVC with indwelling porta catheter. Two bronchial arteries are seen and are not hyper trophic. The more proximal has its origin near the aberrant left subclavian artery ostium. No pseudoaneurysm or active hemorrhage seen within the patient's malignancy. Pulmonary hypertension. No identified acute pulmonary embolism Mediastinum/Nodes: Known right hilar malignancy with airway obstruction. Necrosis present in right paratracheal lymph nodes. Lungs/Pleura: Material cast the right lower lobe and middle lobe airways with right lower lobe collapse. In this setting, this may reflect hemorrhage. Clusters of micronodules in the right upper lobe and left upper lobe that are chronic. No left-sided  source of hemorrhage identified. Mild interstitial coarsening and ground-glass opacity in  the right upper lobe is likely from vascular/lymphatic obstruction Upper Abdomen: No acute finding. Polycystic kidneys. Known left adrenal mass that is hypermetabolic by CT. Musculoskeletal: L1-2 advanced disc disease with sclerosis and subchondral cysts, degenerative appearing. No acute or aggressive finding. Review of the MIP images confirms the above findings. IMPRESSION: 1. Two non hypertrophied bronchial arteries identified. 2. Known right upper lobe malignancy with airway obstruction and bronchial artery/vein narrowing. Material cast the right lower lobe and right middle lobe airways, new from 01/25/2016, presumably clot in this setting. The right lower lobe is completely collapsed. 3. Chronic mild bronchiolitis changes in the left upper lobe. No left-sided source of hemoptysis identified. 4. Ascending aortic aneurysm with aberrant right subclavian artery. Electronically Signed   By: Monte Fantasia M.D.   On: 02/18/2016 12:32   US Thoracentesis Asp Pleural Space W/img Guide  Result Date: 02/18/2016 INDICATION: Lung cancer. Shortness of breath. Right-sided pleural effusion. Request diagnostic and therapeutic thoracentesis. EXAM: ULTRASOUND GUIDED RIGHT THORACENTESIS MEDICATIONS: None. COMPLICATIONS: None immediate. Postprocedural chest x-ray negative for pneumothorax. PROCEDURE: An ultrasound guided thoracentesis was thoroughly discussed with the patient and questions answered. The benefits, risks, alternatives and complications were also discussed. The patient understands and wishes to proceed with the procedure. Written consent was obtained. Ultrasound was performed to localize and mark an adequate pocket of fluid in the right chest. The area was then prepped and draped in the normal sterile fashion. 1% Lidocaine was used for local anesthesia. Under ultrasound guidance a Safe-T-Centesis catheter was introduced. Thoracentesis was performed. The catheter was removed and a dressing applied. FINDINGS: A  total of approximately 750 mL of clear yellow fluid was removed. Samples were sent to the laboratory as requested by the clinical team. IMPRESSION: Successful ultrasound guided right thoracentesis yielding 750 mL of pleural fluid. Read by: Ascencion Dike PA-C Electronically Signed   By: Corrie Mckusick D.O.   On: 02/18/2016 11:00     Katelind Pytel M.D on 02/19/2016 at 3:23 PM  Between 7am to 7pm - Pager - [phone]  After 7pm go to [email or web address] - password [facility]  [facility] -  Office  [phone]

## 2016-02-19 NOTE — Progress Notes (Signed)
PHARMACIST - PHYSICIAN COMMUNICATION CONCERNING:  IV heparin  89 yoF on IV heparin bridge for hx mechanical heart valve.  Please see note written earlier by Margretta Sidle, PharmD for more details.  Noted hematology discussion with pt regarding preference for warfarin over lovenox at discharge.    Heparin level tonight =  0.45 after slight decrease in heparin infusion.   No issues with infusion per RN.   Pt does continue to cough up dime size amount of old blood every few hours per RN.     RECOMMENDATION: Continue IV heparin at 750 units/hr.  Repeat HL in 8 hours.  F/u anticoagulation plans and signs/symptoms of bleeding.   Ralene Bathe, PharmD, BCPS 02/19/2016, 6:39 PM  Pager: [phone]

## 2016-02-19 NOTE — Telephone Encounter (Signed)
called 5E for room 1518, spoke with RN Dekina, patient can come to radiation for treatment today at 1240 pm , she can travel via w/c, not spitting up blood per RN, thanked RN, asked to premedicate patient for pain vbefore if needed, then called Ciara on [place] and gave updated status 7:39 AM

## 2016-02-19 NOTE — Progress Notes (Signed)
ANTICOAGULATION CONSULT NOTE - Initial Consult  Pharmacy Consult for Heparin Indication: Mechanical Heart Valve - bridge therapy while Lovenox on hold for procedure  Allergies  Allergen Reactions  . Alendronate Sodium Other (See Comments)    GI PAIN CAUTION: RISK FOR PERFORATION  . Amiodarone Shortness Of Breath  . Chlorthalidone Shortness Of Breath    ABDOMINAL BLOATING  . Macrodantin [Nitrofurantoin] Nausea And Vomiting and Other (See Comments)    CONTRAINDICATED WITH GFR < 50  . Meclizine Swelling    SWELLING OF TONGUE  . Norvasc [Amlodipine Besylate] Swelling    SWELLING REACTION UNSPECIFIED   . Hydralazine Hcl Nausea Only  . Sulfa Antibiotics Other (See Comments)    Unknown  . Tussionex Pennkinetic Er [Hydrocod Polst-Cpm Polst Er] Other (See Comments)    UNSPECIFIED REACTION     Patient Measurements: Height: '5\' 4"'$  (162.6 cm) Weight: 122 lb (55.3 kg) IBW/kg (Calculated) : 54.7 Heparin Dosing Weight:total body weight  Vital Signs: Temp: 98.6 F (37 C) (11/20 0404) Temp Source: Oral (11/20 0404) BP: 124/68 (11/20 1053) Pulse Rate: 80 (11/20 1053)  Labs:  Recent Labs  02/17/16 2312 02/18/16 0640 02/19/16 0223 02/19/16 0229 02/19/16 0935  HGB 12.0 10.9* 10.3*  --   --   HCT 37.4 34.3* 32.1*  --   --   PLT 286 231 187  --   --   HEPARINUNFRC  --   --   --  0.64 0.69  CREATININE 0.88 0.79 0.92  --   --     Estimated Creatinine Clearance: 37.2 mL/min (by C-G formula based on SCr of 0.92 mg/dL).   Medical History: Past Medical History:  Diagnosis Date  . Acute CHF (congestive heart failure) ([place]) 01/10/2016  . Anemia   . Arthritis    "fingers, toes, hips; qwhere" (05/19/2013)  . Breast cancer ([place]) dx'd 1994   "right; S/P lumpectomy, chemo, XRT"   . Chronic lower GI bleeding   . Complication of anesthesia    "once I'm awakened, I don't sleep til the following night" BP drops very low had to stop putting in [place] -a- cath  . GERD (gastroesophageal reflux  disease)   . Gout   . Heart murmur   . High cholesterol   . History of blood transfusion 1996; ~ 2013   "related to valve replacement; GI bleeding"   . History of breast cancer 02/16/2016  . History of echocardiogram    Echo 1/17: mild LVH, EF 50-55%, no RWMA, mild AI, mechanical AVR with mean 21 mmHg/peak 42 mmHg (stable since 2012), mod MR, mod to severe BAE, PASP 36 mmHg  . History of nuclear stress test    Myoview 1/17: EF 59%, normal perfusion, low risk  . Hypertension   . Hypothyroidism   . Lung cancer ([place]) dx'd 01/2016   SCL  . On supplemental oxygen therapy   . Permanent atrial fibrillation ([place])       . Pneumonia 07/2011  . Rectus sheath hematoma 12/19/14    held anticoagulation for 1 week, transfused  . Small cell lung cancer, right ([place]) 02/16/2016  . Stomach problems    seeing Dr Watt Climes  . TIA (transient ischemic attack) 12/24/14   symptoms resolved    Assessment:  61 female patient with PMH significant for mechanical aortic valve and TIA presents with coughing up blood.  Patient noted to have undergone a lung biopsy on 02/07/16, which reportedly showed SCLC.  Patient on Lovenox '60mg'$  sq q12h for mechanical AVR  Thoracentesis  planned; therefore, Lovenox was placed on hold.  Pharmacy consulted to dose IV heparin for bridge therapy while off Lovenox.  Last dose of Lovenox reported as taken on 02/17/16 @ 8am.   IV heparin was started at 5 pm post-thoracentesis on 02/18/16.   Goal of Therapy:  Heparin Level = 0.3-0.7 Monitor platelets by anticoagulation protocol: Yes   Today:   Intermittently coughing up small amounts of old blood  Hgb down slightly.  Pltc WNL.  Heparin level therapeutic (0.69) on 800 units/hr, but trending up and near upper end of therapeutic range.  Question of whether patient may need bronchoscopy, so for now is remaining on IV heparin rather than switching back to SQ Lovenox.    Plan:   Reduce Heparin to 750 units/hr  Recheck heparin  level at 5 pm   Follow heparin level at least daily & CBC daily  Await further word on possible bronchoscopy  Clayburn Pert, PharmD, BCPS Pager: [phone] 02/19/2016  11:09 AM

## 2016-02-19 NOTE — Progress Notes (Signed)
PHARMACY - HEPARIN (brief note)  IV heparin infusing @ 800 units/hr for anticoagulation of mechanical heart valve (bridge therapy while Lovenox on hold)  Heparin level = 0.64 (Goal 0.3-0.7) Patient with noted hemoptysis in the setting of SCLC No new complications of therapy noted  Plan: Continue IV heparin @ 800 units/hr Recheck heparin level in 8 hr to confirm therapeutic dose  Leone Haven, PharmD 02/19/16 @ 03:19

## 2016-02-20 ENCOUNTER — Ambulatory Visit
Admission: RE | Admit: 2016-02-20 | Discharge: 2016-02-20 | Disposition: A | Discharge status: Home / Self Care | Payer: Medicare Other | Source: Ambulatory Visit | Attending: Radiation Oncology | Admitting: Radiation Oncology

## 2016-02-20 ENCOUNTER — Telehealth: Payer: Self-pay | Admitting: Oncology

## 2016-02-20 LAB — TYPE AND SCREEN
ABO/RH(D): O POS
ANTIBODY SCREEN: NEGATIVE

## 2016-02-20 LAB — CBC
HEMATOCRIT: 33 % — AB (ref 36.0–46.0)
HEMOGLOBIN: 10.7 g/dL — AB (ref 12.0–15.0)
MCH: 33.2 pg (ref 26.0–34.0)
MCHC: 32.4 g/dL (ref 30.0–36.0)
MCV: 102.5 fL — AB (ref 78.0–100.0)
Platelets: 194 10*3/uL (ref 150–400)
RBC: 3.22 MIL/uL — ABNORMAL LOW (ref 3.87–5.11)
RDW: 16.5 % — AB (ref 11.5–15.5)
WBC: 8 10*3/uL (ref 4.0–10.5)

## 2016-02-20 LAB — BASIC METABOLIC PANEL
ANION GAP: 5 (ref 5–15)
BUN: 41 mg/dL — ABNORMAL HIGH (ref 6–20)
CALCIUM: 9.3 mg/dL (ref 8.9–10.3)
CHLORIDE: 94 mmol/L — AB (ref 101–111)
CO2: 34 mmol/L — AB (ref 22–32)
Creatinine, Ser: 0.97 mg/dL (ref 0.44–1.00)
GFR calc Af Amer: 59 mL/min — ABNORMAL LOW (ref >60)
GFR calc non Af Amer: 51 mL/min — ABNORMAL LOW (ref >60)
GLUCOSE: 128 mg/dL — AB (ref 65–99)
Potassium: 4.1 mmol/L (ref 3.5–5.1)
Sodium: 133 mmol/L — ABNORMAL LOW (ref 135–145)

## 2016-02-20 LAB — HEPARIN LEVEL (UNFRACTIONATED): Heparin Unfractionated: 0.47 IU/mL (ref 0.30–0.70)

## 2016-02-20 MED ORDER — PANTOPRAZOLE SODIUM 40 MG PO TBEC
40.0000 mg | DELAYED_RELEASE_TABLET | Freq: Every day | ORAL | Status: DC
  Administered 2016-02-20 – 2016-02-22 (×3): 40 mg via ORAL
  Filled 2016-02-20 (×3): qty 1

## 2016-02-20 NOTE — Progress Notes (Signed)
PROGRESS NOTE                                                                                                                                                                                                             Patient Demographics:    Deborah [name], is a 82 y.o. female, DOB - 09-May-1926, QIO:[id number]  Admit date - 02/17/2016   Admitting Physician Etta Quill, DO  Outpatient Primary MD for the patient is Mathews Argyle, MD  LOS - 2  Outpatient Specialists: Oncology Dr Jana Hakim.  Chief Complaint  Patient presents with  . Cough    up blood       Brief Narrative  82 y.o. female with medical history significant of mechanical valve replacement on chronic lovenox, patient just had endobronchial biopsy for lung mass performed on 11/8 by Dr. Lamonte Sakai, this has come back showing SCLC of the lung.  Patient presents to the ED with c/o SOB and hemoptysis, Felt secondary to [place] in the setting of biopsy and anticoagulation, hemoptysis significantly subsided after started radiation therapy.   Subjective:    Deborah [name] today has, No headache, No chest pain, No abdominal pain -She reports dyspnea Significantly improved after thoracentesis, hemoptysis significantly subsided, early one episode overnight. Less of some chest pain and radiation site  Assessment  & Plan :    Principal Problem:   Hemoptysis Active Problems:   Pleural effusion on right   Small cell lung cancer, right (HCC)   Dyspnea   Hemoptysis - In the setting of SCLC , with recent biopsy on anticoagulation for mechanical valve - IR and PCCM input greatly appreciated - Monitor CBC closely, hemoglobin remained stable. - Hemoptysis significantly subsided, hopefully will continue to improve as she continues with radiation therapy , if hemoptysis remains minimal, can be transitioned to Lovenox tomorrow , monitor 24 hours on Lovenox and then hopefully can be discharged Thursday  morning on Lovenox ( she hopes to attend Thanksgiving with multiple visiting family members) - If hemoptysis worsens then very likely will need embolization by IR  Right pleural effusion - Status post thoracentesis 11/19, 750 mL drained, otology still pending, prior thoracentesis without evidence of malignancy  Anemia of chronic blood loss - This is managed by Dr. Jana Hakim as outpatient, transfusion by hematology service when  Appropriate.  SCLC - Recently diagnosed, Started  palliative radiation 11/20, Martin Majestic for another session today and tomorrow  Mechanical valve - On Lovenox At home, currently on heparin GTT , if hemoptysis remains minimal will transition to Lovenox tomorrow , and she can be discharged on Lovenox if she remains stable ,would not start warfarin this admission .   Code Status : DNR  Family Communication  : Daughter at bedside  Disposition Plan  : home when stable  Consults  :  IR, PCCM, Oncology  Procedures  : Thoracentesis 11/19, started radiation 11/20  DVT Prophylaxis  :  Heparin GTT  Lab Results  Component Value Date   PLT 194 02/20/2016    Antibiotics  :    Anti-infectives    None        Objective:   Vitals:   02/19/16 1432 02/19/16 1633 02/19/16 2037 02/20/16 0503  BP: (!) 102/57 123/66 115/70 130/66  Pulse: 96 89 95 68  Resp:    18  Temp: 97.6 F (36.4 C)  97.7 F (36.5 C) 98.1 F (36.7 C)  TempSrc: Axillary  Oral Oral  SpO2: 97%  96% 93%  Weight:      Height:        Wt Readings from Last 3 Encounters:  02/17/16 55.3 kg (122 lb)  02/16/16 55.3 kg (122 lb)  02/16/16 56 kg (123 lb 8 oz)     Intake/Output Summary (Last 24 hours) at 02/20/16 1237 Last data filed at 02/20/16 0403  Gross per 24 hour  Intake           225.38 ml  Output                0 ml  Net           225.38 ml     Physical Exam  Awake Alert, Oriented X 3, No new F.N deficits, Normal affect [place].AT,PERRAL Supple Neck,No JVD, d.  Symmetrical Chest wall  movement, diminished air entry bilaterally R>L, no wheezing RRR,No Gallops,Rubs , + valve click, No Parasternal Heave +ve B.Sounds, Abd Soft, No tenderness,  No Cyanosis, Clubbing or edema, No new Rash or bruise      Data Review:    CBC  Recent Labs Lab 02/16/16 1212 02/17/16 2312 02/18/16 0640 02/19/16 0223 02/20/16 0204  WBC 6.4 9.2 6.3 6.2 8.0  HGB 12.5 12.0 10.9* 10.3* 10.7*  HCT 38.8 37.4 34.3* 32.1* 33.0*  PLT 268 286 231 187 194  MCV 100.9 101.9* 103.0* 100.0 102.5*  MCH 32.4 32.7 32.7 32.1 33.2  MCHC 32.1 32.1 31.8 32.1 32.4  RDW 18.8* 16.5* 16.8* 16.4* 16.5*  LYMPHSABS 0.7* 0.7  --   --   --   MONOABS 0.5 0.7  --   --   --   EOSABS 0.0 0.0  --   --   --   BASOSABS 0.0 0.0  --   --   --     Chemistries   Recent Labs Lab 02/16/16 1212 02/17/16 2312 02/18/16 0640 02/19/16 0223 02/20/16 0204  NA 137 133* 134* 134* 133*  K 4.6 4.2 4.1 3.8 4.1  CL  --  92* 92* 95* 94*  CO2 31* 33* 36* 32 34*  GLUCOSE 86 110* 95 112* 128*  BUN 29.4* 37* 38* 42* 41*  CREATININE 0.8 0.88 0.79 0.92 0.97  CALCIUM 10.6* 10.0 9.5 9.1 9.3  AST 20  --   --   --   --   ALT 26  --   --   --   --  ALKPHOS 55  --   --   --   --   BILITOT 0.66  --   --   --   --    ------------------------------------------------------------------------------------------------------------------ No results for input(s): CHOL, HDL, LDLCALC, TRIG, CHOLHDL, LDLDIRECT in the last 72 hours.  Lab Results  Component Value Date   HGBA1C 5.4 12/26/2014   ------------------------------------------------------------------------------------------------------------------ No results for input(s): TSH, T4TOTAL, T3FREE, THYROIDAB in the last 72 hours.  Invalid input(s): FREET3 ------------------------------------------------------------------------------------------------------------------ No results for input(s): VITAMINB12, FOLATE, FERRITIN, TIBC, IRON, RETICCTPCT in the last 72 hours.  Coagulation  profile No results for input(s): INR, PROTIME in the last 168 hours.  No results for input(s): DDIMER in the last 72 hours.  Cardiac Enzymes No results for input(s): CKMB, TROPONINI, MYOGLOBIN in the last 168 hours.  Invalid input(s): CK ------------------------------------------------------------------------------------------------------------------    Component Value Date/Time   BNP 249.4 (H) 02/12/2016 0927   BNP 331.2 (H) 11/23/2015 1522    Inpatient Medications  Scheduled Meds: . allopurinol  300 mg Oral QHS  . carvedilol  6.25 mg Oral BID WC  . cloNIDine  0.1 mg Oral Daily  . dexamethasone  8 mg Oral BID WC  . furosemide  40 mg Oral BID  . irbesartan  300 mg Oral Daily  . levothyroxine  88 mcg Oral QAC breakfast  . loratadine  10 mg Oral Daily  . pantoprazole  40 mg Oral Daily  . potassium chloride SA  10 mEq Oral BID  . zolpidem  5 mg Oral QHS   Continuous Infusions: . heparin 750 Units/hr (02/20/16 0403)   PRN Meds:.acetaminophen, bisacodyl, celecoxib, dicyclomine, hydrOXYzine, iopamidol, LORazepam, morphine injection, sodium chloride flush, traMADol  Micro Results No results found for this or any previous visit (from the past 240 hour(s)).  Radiology Reports Dg Chest 1 View  Result Date: 02/18/2016 CLINICAL DATA:  Right thoracentesis. EXAM: CHEST 1 VIEW COMPARISON:  Chest x-ray from yesterday FINDINGS: Diminished right pleural effusion. No pneumothorax. Known right hilar mass and lower lobe atelectasis. Cardiopericardial enlargement. Porta catheter on the left with tip at a left SVC. IMPRESSION: 1. No acute finding after right thoracentesis. 2. Right basilar atelectasis and known right hilar mass. Electronically Signed   By: Monte Fantasia M.D.   On: 02/18/2016 11:12   Dg Chest 2 View  Result Date: 02/17/2016 CLINICAL DATA:  Status post lung biopsy. Shortness of breath and hemoptysis. EXAM: CHEST  2 VIEW COMPARISON:  Chest radiograph 02/12/2016 FINDINGS:  Unchanged appearance of left chest wall Port-A-Cath with tip in the persistent left-sided SVC. There is aortic atherosclerosis. Median sternotomy wires and right axillary surgical clips are unchanged. Right pleural effusion has increased in size. The left lung is clear. No pneumothorax. No pulmonary edema. IMPRESSION: 1. Increased size of right pleural effusion with associated atelectasis. 2. Aortic atherosclerosis. Electronically Signed   By: Ulyses Jarred M.D.   On: 02/17/2016 22:51   Dg Chest 2 View  Result Date: 02/12/2016 CLINICAL DATA:  Hemoptysis.  Shortness of breath. EXAM: CHEST  2 VIEW COMPARISON:  01/11/2016 FINDINGS: Left Port-A-Cath is in place, unchanged with the tip in the persistent left-sided SVC. Prior median sternotomy. There is cardiomegaly. Increasing right perihilar and right basilar airspace disease. No confluent opacity on the left. Small right pleural effusion. IMPRESSION: Increasing right perihilar and right lower lobe airspace opacities concerning for pneumonia. Followup PA and lateral chest X-ray is recommended in 3-4 weeks following trial of antibiotic therapy to ensure resolution and exclude underlying malignancy. Small right pleural  effusion. Electronically Signed   By: Rolm Baptise M.D.   On: 02/12/2016 09:51   Ct Head W Wo Contrast  Result Date: 02/12/2016 CLINICAL DATA:  Metastatic lung cancer.  Staging. EXAM: CT HEAD WITHOUT AND WITH CONTRAST TECHNIQUE: Contiguous axial images were obtained from the base of the skull through the vertex without and with intravenous contrast CONTRAST:  72m ISOVUE-300 IOPAMIDOL (ISOVUE-300) INJECTION 61% COMPARISON:  CT head 12/26/2014 FINDINGS: Brain: Mild atrophy. Ventricle size within normal limits. Negative for acute infarct. Negative for hemorrhage or mass. Postcontrast imaging demonstrates normal enhancement. No enhancing mass lesion identified. Vascular: Normal arterial and venous enhancement. Skull: Negative Sinuses/Orbits: Mucosal  thickening and opacification of the right sphenoid sinus. No orbital lesion. Other: None IMPRESSION: Negative for metastatic disease to the brain Right sphenoid sinus chronic opacification. Electronically Signed   By: CFranchot GalloM.D.   On: 02/12/2016 11:45   Ct Chest W Contrast  Result Date: 02/12/2016 CLINICAL DATA:  New diagnosis small cell lung cancer.  Hemoptysis. EXAM: CT CHEST WITH CONTRAST TECHNIQUE: Multidetector CT imaging of the chest was performed during intravenous contrast administration. CONTRAST:  749mISOVUE-300 IOPAMIDOL (ISOVUE-300) INJECTION 61% COMPARISON:  01/10/2016 FINDINGS: Cardiovascular: Mild aneurysmal dilatation of the ascending thoracic aorta, 4.4 cm, stable since prior study. Mild cardiomegaly. Diffuse aortic calcifications. No dissection. There is a persistent left SVC noted. Port-A-Cath is in place with the tip in the persistent left SVC. Retroesophageal right subclavian artery again noted. Mediastinum/Nodes: Necrotic appearing right paratracheal lymph nodes, enlarged since prior study. Index node on image 50 measures 2.7 x 2.9 cm compared to 2.5 x 2.7 cm previously. Right peritracheal adenopathy on image 41 measures 3.1 x 2.5 cm compared to 2.7 x 2.0 cm previously. Lungs/Pleura: Large right hilar soft tissue mass noted, enlarging since prior study, measuring 5.4 x 3.8 cm compared with 4.2 x 3.6 cm previously. This mass is partially necrotic. Moderate right pleural effusion is similar to prior study. No left effusion. Airspace disease noted in the right upper lobe likely reflects postobstructive pneumonitis. Compressive atelectasis noted in the right lower lobe. Upper Abdomen: 16 mm nodule in the left adrenal gland appearance likely new since prior CT abdomen 08/05/2015. Multiple renal and hepatic cysts are noted. Musculoskeletal: No visible focal bone lesion or acute bony abnormality. IMPRESSION: Enlarging right hilar mass with ground-glass opacities throughout the right  upper lobe, likely postobstructive pneumonitis. Enlarging necrotic appearing mediastinal adenopathy. Stable moderate right pleural effusion. Cardiomegaly. Stable mild aneurysmal dilatation of the ascending thoracic aorta. Apparent new small nodule in the left adrenal gland. Cannot exclude metastasis. Electronically Signed   By: KeRolm Baptise.D.   On: 02/12/2016 11:48   Nm Pet Image Initial (pi) Skull Base To Thigh  Result Date: 01/25/2016 CLINICAL DATA:  Initial treatment strategy for right upper lobe lung mass detected on recent chest CT angiogram performed in the setting of chest tightness and shortness of breath. History of breast cancer. EXAM: NUCLEAR MEDICINE PET SKULL BASE TO THIGH TECHNIQUE: 5.8 mCi F-18 FDG was injected intravenously. Full-ring PET imaging was performed from the skull base to thigh after the radiotracer. CT data was obtained and used for attenuation correction and anatomic localization. FASTING BLOOD GLUCOSE:  Value: 109 mg/dl COMPARISON:  01/10/2016 chest CT angiogram. 08/05/2015 CT abdomen/pelvis. FINDINGS: NECK No hypermetabolic lymph nodes in the neck. CHEST Hypermetabolic central right upper lobe 5.1 x 3.7 cm lung mass with max SUV 15.4 (series 8/image 29), which encases and occludes the associated segmental right upper lobe  bronchus, and which is confluent with hypermetabolic right hilar and right lower paratracheal adenopathy. Patchy ground-glass opacity in the anterior right upper lobe is most consistent with postobstructive pneumonitis. Separate sharply marginated focus of subpleural reticulation in the anterior right upper lobe is consistent with mild radiation fibrosis. Mild patchy tree-in-bud opacities in the anterior left upper lobe with associated low level metabolism (max SUV 2.7), stable since 01/10/2016, favor mild infectious or inflammatory bronchiolitis. No additional significant pulmonary nodules. Small layering right pleural effusion. No left pleural effusion. No  pleural nodularity or hypermetabolism. Hypermetabolic right paratracheal adenopathy, for example a 2.7 cm right lower paratracheal node with max SUV 13.6 (series 4/image 50). No contralateral mediastinal or contralateral hilar hypermetabolic adenopathy. Surgical clips are again noted in the right axilla. No hypermetabolic axillary lymph nodes. Duplicated superior vena cava. Left internal jugular MediPort terminates within the lower third of the left superior vena cava. Moderate cardiomegaly. Left main, left anterior descending, left circumflex and right coronary atherosclerosis. Stable atherosclerotic aneurysmal ascending thoracic aorta, maximum diameter 4.8 cm. Aberrant nonaneurysmal right subclavian artery arising from the distal aortic arch with retroesophageal course. Stable prominently dilated main pulmonary artery (4.7 cm diameter). ABDOMEN/PELVIS Left adrenal 1.7 cm hypermetabolic nodule with max SUV 12.0 (series 4/image 95), new since 08/05/2015. No right adrenal hypermetabolism or nodularity. Focus of hypermetabolism in the lateral left lower ventral abdominal muscle wall with associated vague low-attenuation 1.4 cm lesion on the CT images with max SUV 7.1 (series 4/image 123). Focal hypermetabolism at the anorectal junction with max SUV 6.6, without discrete mass or definite wall thickening on the CT images. No abnormal hypermetabolic activity within the liver, pancreas, or spleen. No hypermetabolic lymph nodes in the abdomen or pelvis. Cholecystectomy. Atherosclerotic nonaneurysmal abdominal aorta. Grossly stable enlarged polycystic kidneys with scattered subcentimeter hyperdense renal cortical lesions throughout both kidneys, too small to characterize. Largest simple renal cyst measures 6.0 cm in the far lower right kidney and 7.4 cm in the lower left kidney. Hysterectomy. SKELETON No focal hypermetabolic activity to suggest skeletal metastasis. Sternotomy wires appear aligned and intact. IMPRESSION: 1.  Hypermetabolic central right upper lobe 4.1 cm lung mass, which is confluent with hypermetabolic right hilar and right paratracheal adenopathy. Hypermetabolic left adrenal nodule. Favor stage IV (T2b N2 M1b) primary bronchogenic carcinoma. 2. Possible small soft tissue metastasis in the lateral left lower ventral abdominal muscle wall. 3. Nonspecific focal hypermetabolism at the anorectal junction without definite CT correlate, favor physiologic uptake, although a neoplasm cannot be excluded and clinical correlation is necessary. 4. Mild patchy tree-in-bud opacity in the anterior left upper lobe with associated low level metabolism, favor mild infectious or inflammatory bronchiolitis. 5. Postobstructive pneumonitis in the right upper lobe. 6. Cardiomegaly. Small layering right pleural effusion. No appreciable pleural nodularity or hypermetabolism . 7. Duplicated SVC. Aberrant right subclavian artery. Aortic atherosclerosis. Stable ascending thoracic aortic aneurysm, maximum diameter 4.8 cm. Stable prominently dilated main pulmonary artery suggesting chronic pulmonary arterial hypertension. 8. Polycystic kidneys. Electronically Signed   By: Ilona Sorrel M.D.   On: 01/25/2016 16:00   Ct Angio Chest Aorta W/cm &/or Wo/cm  Result Date: 02/18/2016 CLINICAL DATA:  Hemoptysis. Mechanical bowel. Considering empiric bronchial embolization. EXAM: CT ANGIOGRAPHY CHEST WITH CONTRAST TECHNIQUE: Multidetector CT imaging of the chest was performed using the standard protocol during bolus administration of intravenous contrast. Multiplanar CT image reconstructions and MIPs were obtained to evaluate the vascular anatomy. CONTRAST:  100 cc Isovue 370 intravenous COMPARISON:  02/12/2016 FINDINGS: Cardiovascular: Chronic cardiomegaly. The atria  are particularly enlarged. Mechanical aortic valve without para valvular collection. Aneurysmal ascending aorta measuring up to 49 mm diameter. Aberrant right subclavian artery with  retroesophageal course. There is a persistent left SVC with indwelling porta catheter. Two bronchial arteries are seen and are not hyper trophic. The more proximal has its origin near the aberrant left subclavian artery ostium. No pseudoaneurysm or active hemorrhage seen within the patient's malignancy. Pulmonary hypertension. No identified acute pulmonary embolism Mediastinum/Nodes: Known right hilar malignancy with airway obstruction. Necrosis present in right paratracheal lymph nodes. Lungs/Pleura: Material cast the right lower lobe and middle lobe airways with right lower lobe collapse. In this setting, this may reflect hemorrhage. Clusters of micronodules in the right upper lobe and left upper lobe that are chronic. No left-sided source of hemorrhage identified. Mild interstitial coarsening and ground-glass opacity in the right upper lobe is likely from vascular/lymphatic obstruction Upper Abdomen: No acute finding. Polycystic kidneys. Known left adrenal mass that is hypermetabolic by CT. Musculoskeletal: L1-2 advanced disc disease with sclerosis and subchondral cysts, degenerative appearing. No acute or aggressive finding. Review of the MIP images confirms the above findings. IMPRESSION: 1. Two non hypertrophied bronchial arteries identified. 2. Known right upper lobe malignancy with airway obstruction and bronchial artery/vein narrowing. Material cast the right lower lobe and right middle lobe airways, new from 01/25/2016, presumably clot in this setting. The right lower lobe is completely collapsed. 3. Chronic mild bronchiolitis changes in the left upper lobe. No left-sided source of hemoptysis identified. 4. Ascending aortic aneurysm with aberrant right subclavian artery. Electronically Signed   By: Monte Fantasia M.D.   On: 02/18/2016 12:32   US Thoracentesis Asp Pleural Space W/img Guide  Result Date: 02/18/2016 INDICATION: Lung cancer. Shortness of breath. Right-sided pleural effusion. Request  diagnostic and therapeutic thoracentesis. EXAM: ULTRASOUND GUIDED RIGHT THORACENTESIS MEDICATIONS: None. COMPLICATIONS: None immediate. Postprocedural chest x-ray negative for pneumothorax. PROCEDURE: An ultrasound guided thoracentesis was thoroughly discussed with the patient and questions answered. The benefits, risks, alternatives and complications were also discussed. The patient understands and wishes to proceed with the procedure. Written consent was obtained. Ultrasound was performed to localize and mark an adequate pocket of fluid in the right chest. The area was then prepped and draped in the normal sterile fashion. 1% Lidocaine was used for local anesthesia. Under ultrasound guidance a Safe-T-Centesis catheter was introduced. Thoracentesis was performed. The catheter was removed and a dressing applied. FINDINGS: A total of approximately 750 mL of clear yellow fluid was removed. Samples were sent to the laboratory as requested by the clinical team. IMPRESSION: Successful ultrasound guided right thoracentesis yielding 750 mL of pleural fluid. Read by: Ascencion Dike PA-C Electronically Signed   By: Corrie Mckusick D.O.   On: 02/18/2016 11:00     ELGERGAWY, DAWOOD M.D on 02/20/2016 at 12:37 PM  Between 7am to 7pm - Pager - [phone]  After 7pm go to [email or web address] - password [facility]  [facility] -  Office  [phone]

## 2016-02-20 NOTE — Progress Notes (Signed)
PHARMACIST - PHYSICIAN COMMUNICATION CONCERNING:  IV heparin  89 yoF on IV heparin bridge for hx mechanical heart valve.  Please see note written 11/20 by Margretta Sidle, PharmD for more details.  Noted hematology discussion with pt regarding preference for warfarin over lovenox at discharge.    Heparin level tonight =  0.47.  (therapeutic x 2 ) No issues with infusion per RN.   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    RECOMMENDATION: Continue IV heparin at 750 units/hr.   F/u anticoagulation plans and signs/symptoms of bleeding.  Daily HL/CBC  Dorrene German 02/20/2016 2:58 AM

## 2016-02-20 NOTE — Progress Notes (Signed)
Had significant reflux discmfort yesterday: started omeprazole (protonix)  Hb > 10, no need for transfusion.  We will recheck her CBC 11/27 and support as needed.  If switched to coumadin which is her preference we will recheck her INR that day as well  Please let me know if I can be of further help at this point

## 2016-02-20 NOTE — Telephone Encounter (Signed)
lvm to inform pt of added lab appt 11/27 and weekly per LOS. Pt to stop by scheduling to get updated schedule

## 2016-02-20 NOTE — Progress Notes (Signed)
ANTICOAGULATION CONSULT NOTE  Pharmacy Consult for Heparin Indication: Mechanical Heart Valve - bridge therapy while Lovenox on hold for procedure  Allergies  Allergen Reactions  . Alendronate Sodium Other (See Comments)    GI PAIN CAUTION: RISK FOR PERFORATION  . Amiodarone Shortness Of Breath  . Chlorthalidone Shortness Of Breath    ABDOMINAL BLOATING  . Macrodantin [Nitrofurantoin] Nausea And Vomiting and Other (See Comments)    CONTRAINDICATED WITH GFR < 50  . Meclizine Swelling    SWELLING OF TONGUE  . Norvasc [Amlodipine Besylate] Swelling    SWELLING REACTION UNSPECIFIED   . Hydralazine Hcl Nausea Only  . Sulfa Antibiotics Other (See Comments)    Unknown  . Tussionex Pennkinetic Er [Hydrocod Polst-Cpm Polst Er] Other (See Comments)    UNSPECIFIED REACTION     Patient Measurements: Height: '5\' 4"'$  (162.6 cm) Weight: 122 lb (55.3 kg) IBW/kg (Calculated) : 54.7 Heparin Dosing Weight:total body weight  Vital Signs: Temp: 98.1 F (36.7 C) (11/21 0503) Temp Source: Oral (11/21 0503) BP: 130/66 (11/21 0503) Pulse Rate: 68 (11/21 0503)  Labs:  Recent Labs  02/18/16 0640 02/19/16 0223  02/19/16 0935 02/19/16 1754 02/20/16 0204  HGB 10.9* 10.3*  --   --   --  10.7*  HCT 34.3* 32.1*  --   --   --  33.0*  PLT 231 187  --   --   --  194  HEPARINUNFRC  --   --   < > 0.69 0.45 0.47  CREATININE 0.79 0.92  --   --   --  0.97  < > = values in this interval not displayed.  Estimated Creatinine Clearance: 35.3 mL/min (by C-G formula based on SCr of 0.97 mg/dL).   Medical History: Past Medical History:  Diagnosis Date  . Acute CHF (congestive heart failure) ([place]) 01/10/2016  . Anemia   . Arthritis    "fingers, toes, hips; qwhere" (05/19/2013)  . Breast cancer ([place]) dx'd 1994   "right; S/P lumpectomy, chemo, XRT"   . Chronic lower GI bleeding   . Complication of anesthesia    "once I'm awakened, I don't sleep til the following night" BP drops very low had to stop  putting in [place] -a- cath  . GERD (gastroesophageal reflux disease)   . Gout   . Heart murmur   . High cholesterol   . History of blood transfusion 1996; ~ 2013   "related to valve replacement; GI bleeding"   . History of breast cancer 02/16/2016  . History of echocardiogram    Echo 1/17: mild LVH, EF 50-55%, no RWMA, mild AI, mechanical AVR with mean 21 mmHg/peak 42 mmHg (stable since 2012), mod MR, mod to severe BAE, PASP 36 mmHg  . History of nuclear stress test    Myoview 1/17: EF 59%, normal perfusion, low risk  . Hypertension   . Hypothyroidism   . Lung cancer ([place]) dx'd 01/2016   SCL  . On supplemental oxygen therapy   . Permanent atrial fibrillation ([place])       . Pneumonia 07/2011  . Rectus sheath hematoma 12/19/14    held anticoagulation for 1 week, transfused  . Small cell lung cancer, right ([place]) 02/16/2016  . Stomach problems    seeing Dr Watt Climes  . TIA (transient ischemic attack) 12/24/14   symptoms resolved    Assessment:  23 female patient with PMH significant for mechanical aortic valve and TIA presents with coughing up blood.  Patient noted to have undergone a lung  biopsy on 02/07/16, which reportedly showed SCLC.  Patient on Lovenox '60mg'$  sq q12h for mechanical AVR  Thoracentesis planned; therefore, Lovenox was placed on hold.  Pharmacy consulted to dose IV heparin for bridge therapy while off Lovenox.  Last dose of Lovenox reported as taken on 02/17/16 @ 8am.   IV heparin was started at 5 pm post-thoracentesis on 02/18/16.   Goal of Therapy:  Heparin Level = 0.3-0.7 Monitor platelets by anticoagulation protocol: Yes   Today:   Overnight reportedly coughed up very small amt of old blood  Hgb slightly low, but appears to have stabilized and is >10.  Pltc WNL.  Heparin level therapeutic.  See hematology notes regarding patient's preference for transition back to warfarin as outpatient with Lovenox bridging until INR therapeutic.    Plan:   Continue  Heparin at present rate (750 units/hr)  Daily heparin level, CBC  Await further word regarding potential transition to warfarin with Lovenox bridging.   Clayburn Pert, PharmD, BCPS Pager: [phone] 02/20/2016  6:02 AM

## 2016-02-20 NOTE — Consult Note (Signed)
   [facility] CM Inpatient Consult   02/20/2016  RION SCHNITZER 11-28-1926 [id number]    Spoke with Ms. Mccroskey at bedside to speak with her about re-engaging with [facility] services. Ms. Moller pleasantly declines.  She states she is appreciative of the visit and would call if she changes her mind in the future. Ms. Covel reports she is looking forward to spending time with her family for the Thanksgiving Holiday. Denies any [facility] Care Management needs. Will make inpatient RNCM aware. Will also notify [facility] Management office.    Marthenia Rolling, MSN-Ed, RN,BSN [facility] Liaison [phone]

## 2016-02-20 NOTE — Care Management Note (Signed)
Case Management Note  Patient Details  Name: KARLINA SUARES MRN: [id number] Date of Birth: December 17, 1926  Subjective/Objective: 82 y/o f admitted w/Hemoptysis. From [facility]. PTcons-await recc.                   Action/Plan:d/c home.   Expected Discharge Date:   (UNKNOWN)               Expected Discharge Plan:  [place]  In-House Referral:  Clinical Social Work  Discharge planning Services  CM Consult  Post Acute Care Choice:    Choice offered to:     DME Arranged:    DME Agency:     HH Arranged:    [place] Agency:     Status of Service:  In process, will continue to follow  If discussed at Long Length of Stay Meetings, dates discussed:    Additional Comments:  Dessa Phi, RN 02/20/2016, 1:00 PM

## 2016-02-20 NOTE — Progress Notes (Signed)
   Name: Deborah [name] MRN: [id number] DOB: October 22, 1926    ADMISSION DATE:  02/17/2016 CONSULTATION DATE:  02/18/16  REFERRING MD :  Elgergawy  CHIEF COMPLAINT:  hemoptysis  BRIEF PATIENT DESCRIPTION: 82F w/ recently dx extensive stage SCLC, hx CHF, prior breast ca, GERD, mechanical aortic valve on a/c, HLD, HTN, hypothyroidism presenting with worsening hemoptysis (1 Tbs every 1-2 h) x 24h  SIGNIFICANT EVENTS  11/18 - Admit 11/20 - Started XRT  STUDIES:  CXR 02/17/16 R effusion; otherwise largely clear.   SUBJECTIVE: No acute events overnight. Patient has decreasing amount of hemoptysis of old, dark blood. No bright red blood appreciated or reported. Patient denies any increasing dyspnea. Patient did experience some epigastric discomfort last night but denies any abdominal pain or nausea. Tolerated first round of radiation therapy yesterday and today well.  REVIEW OF SYSTEMS:  No subjective fever, chills, or sweats. No headache or vision changes.  VITAL SIGNS: Temp:  [97.6 F (36.4 C)-98.1 F (36.7 C)] 98.1 F (36.7 C) (11/21 0503) Pulse Rate:  [68-96] 68 (11/21 0503) Resp:  [18] 18 (11/21 0503) BP: (102-130)/(57-70) 130/66 (11/21 0503) SpO2:  [93 %-97 %] 93 % (11/21 0503)  PHYSICAL EXAMINATION: General:  Awake. Alert. No distress.  Integument:  Warm & dry. No rash on exposed skin.  HEENT:  Moist mucus membranes. No scleral injection or icterus.  Cardiovascular:  Regular rate. No edema. Mechanical murmur again appreciated. Pulmonary:  Clear bilaterally to auscultation. Normal work of breathing on high flow nasal cannula oxygen. Abdomen: Soft. Normal bowel sounds. Nontender. Musculoskeletal:  Normal bulk and tone. No joint deformity or effusion appreciated.    Recent Labs Lab 02/18/16 0640 02/19/16 0223 02/20/16 0204  NA 134* 134* 133*  K 4.1 3.8 4.1  CL 92* 95* 94*  CO2 36* 32 34*  BUN 38* 42* 41*  CREATININE 0.79 0.92 0.97  GLUCOSE 95 112* 128*    Recent  Labs Lab 02/18/16 0640 02/19/16 0223 02/20/16 0204  HGB 10.9* 10.3* 10.7*  HCT 34.3* 32.1* 33.0*  WBC 6.3 6.2 8.0  PLT 231 187 194   No results found.RLL ATX tumor in right bronchus.   ASSESSMENT / PLAN:  80 y.o. 82 year old female with extensive stage small cell lung cancer and ongoing hemoptysis. Has successfully transitioned over to heparin infusion for systemic anticoagulation. Continuing to have some hemoptysis of blood. No evidence of active bleeding on heparin infusion. Case discussed with attending hospitalist. Significant risk for rebleeding from the patient's underlying malignancy.  1. Hemoptysis: Improving. Monitoring closely on systemic anticoagulation. If recurs would need evaluation by interventional radiology for possible embolization. 2. Extensive stage small cell lung cancer: Palliative radiation therapy ongoing. 3. Mechanical aortic valve: Currently on systemic anticoagulation with heparin infusion per pharmacy protocol.Plan to transition back to subcutaneous Lovenox for therapeutic anticoagulation tomorrow. I would favor holding off on restarting oral Coumadin at this time.  Sonia Baller Ashok Cordia, M.D. [facility] Pulmonary & Critical Care Pager:  [phone] After 3pm or if no response, call [phone] 02/20/2016, 12:10 PM

## 2016-02-21 ENCOUNTER — Telehealth: Payer: Self-pay | Admitting: Emergency Medicine

## 2016-02-21 ENCOUNTER — Ambulatory Visit
Admission: RE | Admit: 2016-02-21 | Discharge: 2016-02-21 | Disposition: A | Discharge status: Home / Self Care | Payer: Medicare Other | Source: Ambulatory Visit | Attending: Radiation Oncology | Admitting: Radiation Oncology

## 2016-02-21 DIAGNOSIS — J9601 Acute respiratory failure with hypoxia: Secondary | ICD-10-CM

## 2016-02-21 DIAGNOSIS — I712 Thoracic aortic aneurysm, without rupture: Secondary | ICD-10-CM

## 2016-02-21 DIAGNOSIS — Q2730 Arteriovenous malformation, site unspecified: Secondary | ICD-10-CM

## 2016-02-21 LAB — CBC
HEMATOCRIT: 34.1 % — AB (ref 36.0–46.0)
HEMOGLOBIN: 10.8 g/dL — AB (ref 12.0–15.0)
MCH: 32.6 pg (ref 26.0–34.0)
MCHC: 31.7 g/dL (ref 30.0–36.0)
MCV: 103 fL — ABNORMAL HIGH (ref 78.0–100.0)
Platelets: 204 10*3/uL (ref 150–400)
RBC: 3.31 MIL/uL — AB (ref 3.87–5.11)
RDW: 16.3 % — ABNORMAL HIGH (ref 11.5–15.5)
WBC: 7.9 10*3/uL (ref 4.0–10.5)

## 2016-02-21 LAB — HEPARIN LEVEL (UNFRACTIONATED): HEPARIN UNFRACTIONATED: 0.68 [IU]/mL (ref 0.30–0.70)

## 2016-02-21 MED ORDER — ENOXAPARIN SODIUM 60 MG/0.6ML ~~LOC~~ SOLN: 55.0000 mg | Freq: Two times a day (BID) | SUBCUTANEOUS | Status: DC

## 2016-02-21 MED ORDER — ENOXAPARIN SODIUM 60 MG/0.6ML ~~LOC~~ SOLN
50.0000 mg | Freq: Two times a day (BID) | SUBCUTANEOUS | Status: DC
  Administered 2016-02-21 – 2016-02-22 (×3): 50 mg via SUBCUTANEOUS
  Filled 2016-02-21 (×3): qty 0.6

## 2016-02-21 NOTE — Telephone Encounter (Signed)
Spoke with pt daughter [name], aware per RB that he is in communication with JN/MD's at [facility] today to see what is going on. [name] is very appreciative of Dr Lamonte Sakai reaching out and wants to send her thank you and happy thanksgiving to him for his help. Will send back to RB as FYI.

## 2016-02-21 NOTE — Telephone Encounter (Signed)
Spoke with [facility] Jones(daughter) - requests that we let Dr Lamonte Sakai know that the patient is still in the hospital. States that they were told that she only supposed to be in the hospital over night while they transitioned her from the Heparin to the Lovenox and this has not been done yet. The family is wanting to get the patient home and is not understanding why there is such a delay in getting this transition started. Pt daughter [name] is wanting to know if this can be started soon so they can get her home and comfortable. They want to spend the Holiday with her at home.  Please advise Dr Lamonte Sakai. Thanks.

## 2016-02-21 NOTE — Progress Notes (Signed)
Patient is from [facility]. Patient reports she plans to return at discharge. Patient does not need CSW assistance at this time. Please reconsult if needed.   Kathrin Greathouse, Latanya Presser, MSW Clinical Social Worker 5E and Psychiatric Service Line [phone] 02/21/2016  3:29 PM

## 2016-02-21 NOTE — Progress Notes (Signed)
PROGRESS NOTE    Deborah [name]  TML:[id number] DOB: Feb 18, 1927 DOA: 02/17/2016 PCP: Mathews Argyle, MD    Brief Narrative:  82 y.o.femalewith medical history significant of mechanical valve replacement on chronic lovenox, patient just had endobronchial biopsy for lung mass performed on 11/8 by Dr. Lamonte Sakai, this has come back showing SCLC of the lung. Patient presents to the ED with c/o SOB and hemoptysis, Felt secondary to [place] in the setting of biopsy and anticoagulation, hemoptysis significantly subsided after started radiation therapy.  Assessment & Plan:   Principal Problem:   Hemoptysis Active Problems:   Pleural effusion on right   Small cell lung cancer, right (HCC)   Dyspnea   Hemoptysis - In the setting of SCLC , with recent biopsy on anticoagulation for mechanical valve - IR and PCCM recommendations reviewed and are appreciated - Hemoglobin remained stable - Plan transition to Lovenox today, monitor overnight. If no significant hemoptysis consider discharge with continued Lovenox - If hemoptysis worsens then very likely will need embolization by IR  Right pleural effusion - Status post thoracentesis 11/19, 750 mL drained, otology still pending, prior thoracentesis without evidence of malignancy -Remains stable at present, minimal O2 support  Anemia of chronic blood loss -Patient followed by Dr. Jana Hakim as outpatient -P CBC in morning, transfuse as needed  SCLC - Recently diagnosed, Started palliative radiation 11/20 -Stable at present  Mechanical valve - On Lovenox At home, had been continued on heparin drip. -Recommendation to transition back to Lovenox. Monitor overnight, repeat CBC in morning -If recurrent hemoptysis, per above, will consider embolization by interventional radiology  DVT prophylaxis: Therapeutic anticoagulation Code Status: DO NOT RESUSCITATE Family Communication: Patient room, family at bedside Disposition Plan: Possible  discharge home on 02/22/2016  Consultants:   Pulmonary  Procedures:     Antimicrobials: Anti-infectives    None       Subjective: Very eager to go home today  Objective: Vitals:   02/20/16 0503 02/20/16 1443 02/20/16 2000 02/21/16 0635  BP: 130/66 (!) 116/53 (!) 117/91 (!) 159/86  Pulse: 68 81 (!) 56 94  Resp: '18 18 20 18  '$ Temp: 98.1 F (36.7 C) 97.5 F (36.4 C) 97.5 F (36.4 C) 99.5 F (37.5 C)  TempSrc: Oral Oral Oral Oral  SpO2: 93% 95% 100% 99%  Weight:      Height:        Intake/Output Summary (Last 24 hours) at 02/21/16 1900 Last data filed at 02/21/16 1700  Gross per 24 hour  Intake            592.5 ml  Output                2 ml  Net            590.5 ml   Filed Weights   02/17/16 2150 02/17/16 2155  Weight: 55.3 kg (122 lb) 55.3 kg (122 lb)    Examination:  General exam: Appears calm and comfortable  Respiratory system: Clear to auscultation. Respiratory effort normal. Cardiovascular system: S1 & S2 heard, RRR, Systolic murmur with click status post valve replacement Gastrointestinal system: Abdomen is nondistended, soft and nontender. No organomegaly or masses felt. Normal bowel sounds heard. Central nervous system: Alert and oriented. No focal neurological deficits. Extremities: Symmetric 5 x 5 power. Skin: No rashes, lesions  Psychiatry: Judgement and insight appear normal. Mood & affect appropriate.   Data Reviewed: I have personally reviewed following labs and imaging studies  CBC:  Recent Labs Lab 02/16/16  1212 02/17/16 2312 02/18/16 0640 02/19/16 0223 02/20/16 0204 02/21/16 0520  WBC 6.4 9.2 6.3 6.2 8.0 7.9  NEUTROABS 5.3 7.8*  --   --   --   --   HGB 12.5 12.0 10.9* 10.3* 10.7* 10.8*  HCT 38.8 37.4 34.3* 32.1* 33.0* 34.1*  MCV 100.9 101.9* 103.0* 100.0 102.5* 103.0*  PLT 268 286 231 187 194 539   Basic Metabolic Panel:  Recent Labs Lab 02/16/16 1212 02/17/16 2312 02/18/16 0640 02/19/16 0223 02/20/16 0204  NA 137  133* 134* 134* 133*  K 4.6 4.2 4.1 3.8 4.1  CL  --  92* 92* 95* 94*  CO2 31* 33* 36* 32 34*  GLUCOSE 86 110* 95 112* 128*  BUN 29.4* 37* 38* 42* 41*  CREATININE 0.8 0.88 0.79 0.92 0.97  CALCIUM 10.6* 10.0 9.5 9.1 9.3   GFR: Estimated Creatinine Clearance: 35.3 mL/min (by C-G formula based on SCr of 0.97 mg/dL). Liver Function Tests:  Recent Labs Lab 02/16/16 1212  AST 20  ALT 26  ALKPHOS 55  BILITOT 0.66  PROT 6.4  ALBUMIN 3.6   No results for input(s): LIPASE, AMYLASE in the last 168 hours. No results for input(s): AMMONIA in the last 168 hours. Coagulation Profile: No results for input(s): INR, PROTIME in the last 168 hours. Cardiac Enzymes: No results for input(s): CKTOTAL, CKMB, CKMBINDEX, TROPONINI in the last 168 hours. BNP (last 3 results) No results for input(s): PROBNP in the last 8760 hours. HbA1C: No results for input(s): HGBA1C in the last 72 hours. CBG: No results for input(s): GLUCAP in the last 168 hours. Lipid Profile: No results for input(s): CHOL, HDL, LDLCALC, TRIG, CHOLHDL, LDLDIRECT in the last 72 hours. Thyroid Function Tests: No results for input(s): TSH, T4TOTAL, FREET4, T3FREE, THYROIDAB in the last 72 hours. Anemia Panel: No results for input(s): VITAMINB12, FOLATE, FERRITIN, TIBC, IRON, RETICCTPCT in the last 72 hours. Sepsis Labs: No results for input(s): PROCALCITON, LATICACIDVEN in the last 168 hours.  No results found for this or any previous visit (from the past 240 hour(s)).   Radiology Studies: No results found.  Scheduled Meds: . allopurinol  300 mg Oral QHS  . carvedilol  6.25 mg Oral BID WC  . cloNIDine  0.1 mg Oral Daily  . dexamethasone  8 mg Oral BID WC  . enoxaparin (LOVENOX) injection  50 mg Subcutaneous Q12H  . furosemide  40 mg Oral BID  . irbesartan  300 mg Oral Daily  . levothyroxine  88 mcg Oral QAC breakfast  . loratadine  10 mg Oral Daily  . pantoprazole  40 mg Oral Daily  . potassium chloride SA  10 mEq Oral  BID  . zolpidem  5 mg Oral QHS   Continuous Infusions:   LOS: 3 days   Shonia Skilling, Orpah Melter, MD [facility] Pager [phone]  If 7PM-7AM, please contact night-coverage [email or web address] Password [email or web address] 02/21/2016, 7:00 PM

## 2016-02-21 NOTE — Telephone Encounter (Signed)
Thanks, I spoke with Dr Ashok Cordia. He was planing to see her today, get questions answered.

## 2016-02-21 NOTE — Progress Notes (Signed)
   Name: Deborah [name] MRN: [id number] DOB: 1926/10/23    ADMISSION DATE:  02/17/2016 CONSULTATION DATE:  02/18/16  REFERRING MD :  Elgergawy  CHIEF COMPLAINT:  hemoptysis  BRIEF PATIENT DESCRIPTION: 50F w/ recently dx extensive stage SCLC, hx CHF, prior breast ca, GERD, mechanical aortic valve on a/c, HLD, HTN, hypothyroidism presenting with worsening hemoptysis (1 Tbs every 1-2 h) x 24h  SIGNIFICANT EVENTS  11/18 - Admit 11/20 - Started XRT  STUDIES:  CXR 02/17/16 R effusion; otherwise largely clear.   SUBJECTIVE: No hemoptysis overnight. Patient reports cough is minimal. Denies any chest pain or pressure. Received 3rd dose of XRT today. Denies any dyspnea at rest.   REVIEW OF SYSTEMS:  No nausea or emesis. No fever or chills. No headaches.  VITAL SIGNS: Temp:  [97.5 F (36.4 C)-99.5 F (37.5 C)] 99.5 F (37.5 C) (11/22 0635) Pulse Rate:  [56-94] 94 (11/22 0635) Resp:  [18-20] 18 (11/22 0635) BP: (117-159)/(86-91) 159/86 (11/22 0635) SpO2:  [99 %-100 %] 99 % (11/22 3837)  PHYSICAL EXAMINATION: General:  Awake. Alert. No distress. Sitting up in chair with son at bedside. Integument:  Warm & dry. No rash on exposed skin.  HEENT:  Moist mucus membranes. No scleral injection or icterus.  Cardiovascular:  Regular rate and rhythm. Mechanical murmur again appreciated. Pulmonary:  Clear with auscultation bilaterally. Normal work of breathing on nasal cannula oxygen. Abdomen: Soft. Normal bowel sounds.  Musculoskeletal:  Normal bulk and tone. No joint deformity or effusion appreciated.   Recent Labs Lab 02/18/16 0640 02/19/16 0223 02/20/16 0204  NA 134* 134* 133*  K 4.1 3.8 4.1  CL 92* 95* 94*  CO2 36* 32 34*  BUN 38* 42* 41*  CREATININE 0.79 0.92 0.97  GLUCOSE 95 112* 128*    Recent Labs Lab 02/19/16 0223 02/20/16 0204 02/21/16 0520  HGB 10.3* 10.7* 10.8*  HCT 32.1* 33.0* 34.1*  WBC 6.2 8.0 7.9  PLT 187 194 204   No results found.RLL ATX tumor in right  bronchus.   ASSESSMENT / PLAN:  82 y.o. 82 year old female with extensive stage small cell lung cancer and ongoing hemoptysis. Has successfully transitioned over to heparin infusion for systemic anticoagulation. Hemoptysis has resolved. Patient is transitioning over to Lovenox for systemic anticoagulation. Explained to the patient that I am concerned her hemoptysis could recur.   1. Hemoptysis: Temporarily resolved. Monitoring closely on Lovenox for anticoagulation. If recurs would need evaluation by interventional radiology for possible embolization. 2. Extensive stage small cell lung cancer: Palliative radiation therapy. Previously refused chemotherapy. 3. Mechanical aortic valve: Transitioning from Heparin drip to Lovenox for systemic antioagulation. I would favor holding off on restarting oral Coumadin at this time.  Sonia Baller Ashok Cordia, M.D. [facility] Pulmonary & Critical Care Pager:  [phone] After 3pm or if no response, call [phone] 02/21/2016, 2:55 PM

## 2016-02-21 NOTE — Progress Notes (Addendum)
ANTICOAGULATION CONSULT NOTE  Pharmacy Consult for: Transition Heparin Back to Lovenox Indication: Mechanical Heart Valve   Allergies  Allergen Reactions  . Alendronate Sodium Other (See Comments)    GI PAIN CAUTION: RISK FOR PERFORATION  . Amiodarone Shortness Of Breath  . Chlorthalidone Shortness Of Breath    ABDOMINAL BLOATING  . Macrodantin [Nitrofurantoin] Nausea And Vomiting and Other (See Comments)    CONTRAINDICATED WITH GFR < 50  . Meclizine Swelling    SWELLING OF TONGUE  . Norvasc [Amlodipine Besylate] Swelling    SWELLING REACTION UNSPECIFIED   . Hydralazine Hcl Nausea Only  . Sulfa Antibiotics Other (See Comments)    Unknown  . Tussionex Pennkinetic Er [Hydrocod Polst-Cpm Polst Er] Other (See Comments)    UNSPECIFIED REACTION     Patient Measurements: Height: '5\' 4"'$  (162.6 cm) Weight: 122 lb (55.3 kg) IBW/kg (Calculated) : 54.7 Heparin Dosing Weight:total body weight  Vital Signs: Temp: 99.5 F (37.5 C) (11/22 0635) Temp Source: Oral (11/22 0635) BP: 159/86 (11/22 0635) Pulse Rate: 94 (11/22 0635)  Labs:  Recent Labs  02/19/16 0223  02/19/16 1754 02/20/16 0204 02/21/16 0520  HGB 10.3*  --   --  10.7* 10.8*  HCT 32.1*  --   --  33.0* 34.1*  PLT 187  --   --  194 204  HEPARINUNFRC  --   < > 0.45 0.47 0.68  CREATININE 0.92  --   --  0.97  --   < > = values in this interval not displayed.  Estimated Creatinine Clearance: 35.3 mL/min (by C-G formula based on SCr of 0.97 mg/dL).   Medical History: Past Medical History:  Diagnosis Date  . Acute CHF (congestive heart failure) ([place]) 01/10/2016  . Anemia   . Arthritis    "fingers, toes, hips; qwhere" (05/19/2013)  . Breast cancer ([place]) dx'd 1994   "right; S/P lumpectomy, chemo, XRT"   . Chronic lower GI bleeding   . Complication of anesthesia    "once I'm awakened, I don't sleep til the following night" BP drops very low had to stop putting in [place] -a- cath  . GERD (gastroesophageal reflux  disease)   . Gout   . Heart murmur   . High cholesterol   . History of blood transfusion 1996; ~ 2013   "related to valve replacement; GI bleeding"   . History of breast cancer 02/16/2016  . History of echocardiogram    Echo 1/17: mild LVH, EF 50-55%, no RWMA, mild AI, mechanical AVR with mean 21 mmHg/peak 42 mmHg (stable since 2012), mod MR, mod to severe BAE, PASP 36 mmHg  . History of nuclear stress test    Myoview 1/17: EF 59%, normal perfusion, low risk  . Hypertension   . Hypothyroidism   . Lung cancer ([place]) dx'd 01/2016   SCL  . On supplemental oxygen therapy   . Permanent atrial fibrillation ([place])       . Pneumonia 07/2011  . Rectus sheath hematoma 12/19/14    held anticoagulation for 1 week, transfused  . Small cell lung cancer, right ([place]) 02/16/2016  . Stomach problems    seeing Dr Watt Climes  . TIA (transient ischemic attack) 12/24/14   symptoms resolved    Assessment:  57 female patient with PMH significant for mechanical aortic valve and TIA presented with hemoptysis.  Is s/p lung biopsy on 02/07/16, which reportedly showed SCLC.  Patient on chronic Lovenox '50mg'$  sq q12h for mechanical AVR (information corrected from initial consult notes, which  listed '60mg'$  q12h)  Thoracentesis planned; therefore, Lovenox was placed on hold.  Pharmacy was consulted to dose IV heparin for bridge therapy while off Lovenox.  Last dose of Lovenox reported as taken on 02/17/16 @ 8am.   IV heparin was started at 5 pm post-thoracentesis on 02/18/16.   Goal of Therapy:  Heparin Level = 0.3-0.7 Monitor platelets by anticoagulation protocol: Yes   Today:   No further reports of hemoptysis  Hgb slightly low, but appears to have stabilized and is >10.  Pltc WNL.  Heparin level therapeutic.  Patient was reportedly interested in transition back to warfarin, which she was on at some point previously, and hematology is willing to follow, but pulmonology recommended holding off on warfarin now  and transitioning back to Lovenox instead.  Orders received to DC heparin and transition back to Lovenox.     Plan:   DC IV heparin  1 hour later, resume Lovenox 50 mg SQ q12h  Follow for potential recurrent hemoptysis.     Clayburn Pert, PharmD, BCPS Pager: [phone] 02/21/2016  9:57 AM

## 2016-02-22 LAB — CBC
HCT: 32.8 % — ABNORMAL LOW (ref 36.0–46.0)
Hemoglobin: 10.4 g/dL — ABNORMAL LOW (ref 12.0–15.0)
MCH: 31.8 pg (ref 26.0–34.0)
MCHC: 31.7 g/dL (ref 30.0–36.0)
MCV: 100.3 fL — ABNORMAL HIGH (ref 78.0–100.0)
PLATELETS: 203 10*3/uL (ref 150–400)
RBC: 3.27 MIL/uL — ABNORMAL LOW (ref 3.87–5.11)
RDW: 16.1 % — ABNORMAL HIGH (ref 11.5–15.5)
WBC: 8.9 10*3/uL (ref 4.0–10.5)

## 2016-02-22 MED ORDER — HEPARIN SOD (PORK) LOCK FLUSH 100 UNIT/ML IV SOLN: 500.0000 [IU] | INTRAVENOUS | Status: DC | PRN

## 2016-02-22 MED ORDER — ENOXAPARIN SODIUM 100 MG/ML ~~LOC~~ SOLN: 50.0000 mg | Freq: Two times a day (BID) | SUBCUTANEOUS | Status: DC

## 2016-02-22 NOTE — Progress Notes (Signed)
Discharge instructions given to pt and family, verbalized understanding. Left the unit in stable condition.

## 2016-02-22 NOTE — Discharge Summary (Signed)
Physician Discharge Summary  Deborah [name] NTI:[id number] DOB: 1927-01-08 DOA: 02/17/2016  PCP: Mathews Argyle, MD  Admit date: 02/17/2016 Discharge date: 02/22/2016  Admitted From: [facility] Disposition:  [facility]  Recommendations for Outpatient Follow-up:  1. Follow up with PCP in 1-2 weeks 2. Please obtain CBC in one week 3. Consider resuming coumadin in 1-2 weeks if no hemoptysis 4. If further hemoptysis, then consider evaluation by IR for possible embolization  Discharge Condition:Stable CODE STATUS:DNR Diet recommendation: Regular   Brief/Interim Summary: 82 y.o.femalewith medical history significant of mechanical valve replacement on chronic lovenox, patient just had endobronchial biopsy for lung mass performed on 11/8 by Dr. Lamonte Sakai, this has come back showing SCLC of the lung. Patient presents to the ED with c/o SOB and hemoptysis, Felt secondary to [place] in the setting of biopsy and anticoagulation, hemoptysis significantly subsided after started radiation therapy.  Hemoptysis - In the setting of SCLC , with recent biopsy on anticoagulation for mechanical valve - IR and PCCM recommendations reviewed and are appreciated - Hemoglobin remained stable - Plan transition to Lovenox with no further hemoptysis overnight - If hemoptysis worsens then very likely will need embolization by IR -Per pulmonary recommendations, rec recommendations to continue Lovenox for 1-2 weeks, if no hemoptysis, then consider resuming Coumadin at that time.  Right pleural effusion - Status post thoracentesis 11/19, 750 mL drained, otology still pending, prior thoracentesis without evidence of malignancy -Remains stable at present, minimal O2 support  Anemia of chronic blood loss -Patient followed by Dr. Jana Hakim as outpatient  [place] - Recently diagnosed, Started palliative radiation 11/20 -Stable at present  Mechanical valve - On Lovenox At home, had been continued on heparin  drip. -Per above, patient to continue Lovenox at time of discharge 1-2 weeks. If no further hemoptysis, then consider resuming Coumadin at that time -If recurrent hemoptysis, per above, will consider embolization by interventional radiology  Discharge Diagnoses:  Principal Problem:   Hemoptysis Active Problems:   Pleural effusion on right   Small cell lung cancer, right ([place])   Dyspnea    Discharge Instructions     Medication List    TAKE these medications   acetaminophen 500 MG tablet Commonly known as:  TYLENOL Take 1,000 mg by mouth every 6 (six) hours as needed for mild pain, moderate pain or headache. Reported on 09/26/2015   [facility] ALLERGY 180 MG tablet Generic drug:  fexofenadine Take 180 mg by mouth daily.   allopurinol 300 MG tablet Commonly known as:  ZYLOPRIM Take 300 mg by mouth at bedtime.   amLODipine 5 MG tablet Commonly known as:  NORVASC Take 5 mg by mouth at bedtime.   BENTYL 10 MG capsule Generic drug:  dicyclomine Take 10 mg by mouth daily as needed for spasms.   calcium-vitamin D 500 MG tablet Take 1 tablet by mouth 2 (two) times daily. Reported on 09/26/2015   carvedilol 6.25 MG tablet Commonly known as:  COREG TAKE 6.25 MG BY MOUTH TWICE DAILY   celecoxib 200 MG capsule Commonly known as:  CELEBREX Take 200 mg by mouth daily as needed for moderate pain.   cloNIDine 0.1 MG tablet Commonly known as:  CATAPRES Take 0.1 mg by mouth daily.   dexamethasone 4 MG tablet Commonly known as:  DECADRON Take 2 tablets (8 mg total) by mouth 2 (two) times daily with a meal.   enoxaparin 100 MG/ML injection Commonly known as:  LOVENOX Inject 0.5 mLs (50 mg total) into the skin every 12 (  twelve) hours. Please dispense 2 week supply, zero refills What changed:  See the new instructions.   furosemide 40 MG tablet Commonly known as:  LASIX Take 1-2 tablets daily as directed. What changed:  how much to take  how to take this  when to take  this  reasons to take this  additional instructions   hydrOXYzine 25 MG tablet Commonly known as:  ATARAX/VISTARIL Take 25 mg by mouth 3 (three) times daily as needed for anxiety.   levothyroxine 88 MCG tablet Commonly known as:  SYNTHROID, LEVOTHROID Take 1 tablet (88 mcg total) by mouth daily before breakfast.   LORazepam 0.5 MG tablet Commonly known as:  ATIVAN Take 1 tablet (0.5 mg total) by mouth every 8 (eight) hours as needed for anxiety.   NITROSTAT 0.4 MG SL tablet Generic drug:  nitroGLYCERIN Place 1 tablet (0.4 mg total) under the tongue every 5 (five) minutes as needed for chest pain (x 3 doses). Reported on 10/17/2015   OXYGEN Inhale 2 L into the lungs continuous.   potassium chloride SA 20 MEQ tablet Commonly known as:  K-DUR,KLOR-CON Take 0.5 tablets (10 mEq total) by mouth 2 (two) times daily as needed (taking with Lasix).   PROBIOTIC ADVANCED PO Take 1 tablet by mouth daily. Reported on 10/17/2015   simvastatin 20 MG tablet Commonly known as:  ZOCOR Take 10 mg by mouth daily.   traMADol 50 MG tablet Commonly known as:  ULTRAM Take 1 tablet (50 mg total) by mouth every 6 (six) hours as needed. What changed:  reasons to take this   valsartan 320 MG tablet Commonly known as:  DIOVAN Take 1 tablet (320 mg total) by mouth daily.   zolpidem 5 MG tablet Commonly known as:  AMBIEN Take 5 mg by mouth at bedtime.      Follow-up Information    Mathews Argyle, MD. Schedule an appointment as soon as possible for a visit in 1 week(s).   Specialty:  Internal Medicine Contact information: [address] [place] [address] [phone]          Allergies  Allergen Reactions  . Alendronate Sodium Other (See Comments)    GI PAIN CAUTION: RISK FOR PERFORATION  . Amiodarone Shortness Of Breath  . Chlorthalidone Shortness Of Breath    ABDOMINAL BLOATING  . Macrodantin [Nitrofurantoin] Nausea And Vomiting and Other (See Comments)     CONTRAINDICATED WITH GFR < 50  . Meclizine Swelling    SWELLING OF TONGUE  . Norvasc [Amlodipine Besylate] Swelling    SWELLING REACTION UNSPECIFIED   . Hydralazine Hcl Nausea Only  . Sulfa Antibiotics Other (See Comments)    Unknown  . Tussionex Pennkinetic Er [Hydrocod Polst-Cpm Polst Er] Other (See Comments)    UNSPECIFIED REACTION     Consultations:  Pulmonary  Procedures/Studies: Dg Chest 1 View  Result Date: 02/18/2016 CLINICAL DATA:  Right thoracentesis. EXAM: CHEST 1 VIEW COMPARISON:  Chest x-ray from yesterday FINDINGS: Diminished right pleural effusion. No pneumothorax. Known right hilar mass and lower lobe atelectasis. Cardiopericardial enlargement. Porta catheter on the left with tip at a left SVC. IMPRESSION: 1. No acute finding after right thoracentesis. 2. Right basilar atelectasis and known right hilar mass. Electronically Signed   By: Monte Fantasia M.D.   On: 02/18/2016 11:12   Dg Chest 2 View  Result Date: 02/17/2016 CLINICAL DATA:  Status post lung biopsy. Shortness of breath and hemoptysis. EXAM: CHEST  2 VIEW COMPARISON:  Chest radiograph 02/12/2016 FINDINGS:  Unchanged appearance of left chest wall Port-A-Cath with tip in the persistent left-sided SVC. There is aortic atherosclerosis. Median sternotomy wires and right axillary surgical clips are unchanged. Right pleural effusion has increased in size. The left lung is clear. No pneumothorax. No pulmonary edema. IMPRESSION: 1. Increased size of right pleural effusion with associated atelectasis. 2. Aortic atherosclerosis. Electronically Signed   By: Ulyses Jarred M.D.   On: 02/17/2016 22:51   Dg Chest 2 View  Result Date: 02/12/2016 CLINICAL DATA:  Hemoptysis.  Shortness of breath. EXAM: CHEST  2 VIEW COMPARISON:  01/11/2016 FINDINGS: Left Port-A-Cath is in place, unchanged with the tip in the persistent left-sided SVC. Prior median sternotomy. There is cardiomegaly. Increasing right perihilar and right basilar  airspace disease. No confluent opacity on the left. Small right pleural effusion. IMPRESSION: Increasing right perihilar and right lower lobe airspace opacities concerning for pneumonia. Followup PA and lateral chest X-ray is recommended in 3-4 weeks following trial of antibiotic therapy to ensure resolution and exclude underlying malignancy. Small right pleural effusion. Electronically Signed   By: Rolm Baptise M.D.   On: 02/12/2016 09:51   Ct Head W Wo Contrast  Result Date: 02/12/2016 CLINICAL DATA:  Metastatic lung cancer.  Staging. EXAM: CT HEAD WITHOUT AND WITH CONTRAST TECHNIQUE: Contiguous axial images were obtained from the base of the skull through the vertex without and with intravenous contrast CONTRAST:  58m ISOVUE-300 IOPAMIDOL (ISOVUE-300) INJECTION 61% COMPARISON:  CT head 12/26/2014 FINDINGS: Brain: Mild atrophy. Ventricle size within normal limits. Negative for acute infarct. Negative for hemorrhage or mass. Postcontrast imaging demonstrates normal enhancement. No enhancing mass lesion identified. Vascular: Normal arterial and venous enhancement. Skull: Negative Sinuses/Orbits: Mucosal thickening and opacification of the right sphenoid sinus. No orbital lesion. Other: None IMPRESSION: Negative for metastatic disease to the brain Right sphenoid sinus chronic opacification. Electronically Signed   By: CFranchot GalloM.D.   On: 02/12/2016 11:45   Ct Chest W Contrast  Result Date: 02/12/2016 CLINICAL DATA:  New diagnosis small cell lung cancer.  Hemoptysis. EXAM: CT CHEST WITH CONTRAST TECHNIQUE: Multidetector CT imaging of the chest was performed during intravenous contrast administration. CONTRAST:  798mISOVUE-300 IOPAMIDOL (ISOVUE-300) INJECTION 61% COMPARISON:  01/10/2016 FINDINGS: Cardiovascular: Mild aneurysmal dilatation of the ascending thoracic aorta, 4.4 cm, stable since prior study. Mild cardiomegaly. Diffuse aortic calcifications. No dissection. There is a persistent left SVC  noted. Port-A-Cath is in place with the tip in the persistent left SVC. Retroesophageal right subclavian artery again noted. Mediastinum/Nodes: Necrotic appearing right paratracheal lymph nodes, enlarged since prior study. Index node on image 50 measures 2.7 x 2.9 cm compared to 2.5 x 2.7 cm previously. Right peritracheal adenopathy on image 41 measures 3.1 x 2.5 cm compared to 2.7 x 2.0 cm previously. Lungs/Pleura: Large right hilar soft tissue mass noted, enlarging since prior study, measuring 5.4 x 3.8 cm compared with 4.2 x 3.6 cm previously. This mass is partially necrotic. Moderate right pleural effusion is similar to prior study. No left effusion. Airspace disease noted in the right upper lobe likely reflects postobstructive pneumonitis. Compressive atelectasis noted in the right lower lobe. Upper Abdomen: 16 mm nodule in the left adrenal gland appearance likely new since prior CT abdomen 08/05/2015. Multiple renal and hepatic cysts are noted. Musculoskeletal: No visible focal bone lesion or acute bony abnormality. IMPRESSION: Enlarging right hilar mass with ground-glass opacities throughout the right upper lobe, likely postobstructive pneumonitis. Enlarging necrotic appearing mediastinal adenopathy. Stable moderate right pleural effusion. Cardiomegaly. Stable mild  aneurysmal dilatation of the ascending thoracic aorta. Apparent new small nodule in the left adrenal gland. Cannot exclude metastasis. Electronically Signed   By: Rolm Baptise M.D.   On: 02/12/2016 11:48   Nm Pet Image Initial (pi) Skull Base To Thigh  Result Date: 01/25/2016 CLINICAL DATA:  Initial treatment strategy for right upper lobe lung mass detected on recent chest CT angiogram performed in the setting of chest tightness and shortness of breath. History of breast cancer. EXAM: NUCLEAR MEDICINE PET SKULL BASE TO THIGH TECHNIQUE: 5.8 mCi F-18 FDG was injected intravenously. Full-ring PET imaging was performed from the skull base to thigh  after the radiotracer. CT data was obtained and used for attenuation correction and anatomic localization. FASTING BLOOD GLUCOSE:  Value: 109 mg/dl COMPARISON:  01/10/2016 chest CT angiogram. 08/05/2015 CT abdomen/pelvis. FINDINGS: NECK No hypermetabolic lymph nodes in the neck. CHEST Hypermetabolic central right upper lobe 5.1 x 3.7 cm lung mass with max SUV 15.4 (series 8/image 29), which encases and occludes the associated segmental right upper lobe bronchus, and which is confluent with hypermetabolic right hilar and right lower paratracheal adenopathy. Patchy ground-glass opacity in the anterior right upper lobe is most consistent with postobstructive pneumonitis. Separate sharply marginated focus of subpleural reticulation in the anterior right upper lobe is consistent with mild radiation fibrosis. Mild patchy tree-in-bud opacities in the anterior left upper lobe with associated low level metabolism (max SUV 2.7), stable since 01/10/2016, favor mild infectious or inflammatory bronchiolitis. No additional significant pulmonary nodules. Small layering right pleural effusion. No left pleural effusion. No pleural nodularity or hypermetabolism. Hypermetabolic right paratracheal adenopathy, for example a 2.7 cm right lower paratracheal node with max SUV 13.6 (series 4/image 50). No contralateral mediastinal or contralateral hilar hypermetabolic adenopathy. Surgical clips are again noted in the right axilla. No hypermetabolic axillary lymph nodes. Duplicated superior vena cava. Left internal jugular MediPort terminates within the lower third of the left superior vena cava. Moderate cardiomegaly. Left main, left anterior descending, left circumflex and right coronary atherosclerosis. Stable atherosclerotic aneurysmal ascending thoracic aorta, maximum diameter 4.8 cm. Aberrant nonaneurysmal right subclavian artery arising from the distal aortic arch with retroesophageal course. Stable prominently dilated main pulmonary  artery (4.7 cm diameter). ABDOMEN/PELVIS Left adrenal 1.7 cm hypermetabolic nodule with max SUV 12.0 (series 4/image 95), new since 08/05/2015. No right adrenal hypermetabolism or nodularity. Focus of hypermetabolism in the lateral left lower ventral abdominal muscle wall with associated vague low-attenuation 1.4 cm lesion on the CT images with max SUV 7.1 (series 4/image 123). Focal hypermetabolism at the anorectal junction with max SUV 6.6, without discrete mass or definite wall thickening on the CT images. No abnormal hypermetabolic activity within the liver, pancreas, or spleen. No hypermetabolic lymph nodes in the abdomen or pelvis. Cholecystectomy. Atherosclerotic nonaneurysmal abdominal aorta. Grossly stable enlarged polycystic kidneys with scattered subcentimeter hyperdense renal cortical lesions throughout both kidneys, too small to characterize. Largest simple renal cyst measures 6.0 cm in the far lower right kidney and 7.4 cm in the lower left kidney. Hysterectomy. SKELETON No focal hypermetabolic activity to suggest skeletal metastasis. Sternotomy wires appear aligned and intact. IMPRESSION: 1. Hypermetabolic central right upper lobe 4.1 cm lung mass, which is confluent with hypermetabolic right hilar and right paratracheal adenopathy. Hypermetabolic left adrenal nodule. Favor stage IV (T2b N2 M1b) primary bronchogenic carcinoma. 2. Possible small soft tissue metastasis in the lateral left lower ventral abdominal muscle wall. 3. Nonspecific focal hypermetabolism at the anorectal junction without definite CT correlate, favor physiologic uptake, although  a neoplasm cannot be excluded and clinical correlation is necessary. 4. Mild patchy tree-in-bud opacity in the anterior left upper lobe with associated low level metabolism, favor mild infectious or inflammatory bronchiolitis. 5. Postobstructive pneumonitis in the right upper lobe. 6. Cardiomegaly. Small layering right pleural effusion. No appreciable  pleural nodularity or hypermetabolism . 7. Duplicated SVC. Aberrant right subclavian artery. Aortic atherosclerosis. Stable ascending thoracic aortic aneurysm, maximum diameter 4.8 cm. Stable prominently dilated main pulmonary artery suggesting chronic pulmonary arterial hypertension. 8. Polycystic kidneys. Electronically Signed   By: Ilona Sorrel M.D.   On: 01/25/2016 16:00   Ct Angio Chest Aorta W/cm &/or Wo/cm  Result Date: 02/18/2016 CLINICAL DATA:  Hemoptysis. Mechanical bowel. Considering empiric bronchial embolization. EXAM: CT ANGIOGRAPHY CHEST WITH CONTRAST TECHNIQUE: Multidetector CT imaging of the chest was performed using the standard protocol during bolus administration of intravenous contrast. Multiplanar CT image reconstructions and MIPs were obtained to evaluate the vascular anatomy. CONTRAST:  100 cc Isovue 370 intravenous COMPARISON:  02/12/2016 FINDINGS: Cardiovascular: Chronic cardiomegaly. The atria are particularly enlarged. Mechanical aortic valve without para valvular collection. Aneurysmal ascending aorta measuring up to 49 mm diameter. Aberrant right subclavian artery with retroesophageal course. There is a persistent left SVC with indwelling porta catheter. Two bronchial arteries are seen and are not hyper trophic. The more proximal has its origin near the aberrant left subclavian artery ostium. No pseudoaneurysm or active hemorrhage seen within the patient's malignancy. Pulmonary hypertension. No identified acute pulmonary embolism Mediastinum/Nodes: Known right hilar malignancy with airway obstruction. Necrosis present in right paratracheal lymph nodes. Lungs/Pleura: Material cast the right lower lobe and middle lobe airways with right lower lobe collapse. In this setting, this may reflect hemorrhage. Clusters of micronodules in the right upper lobe and left upper lobe that are chronic. No left-sided source of hemorrhage identified. Mild interstitial coarsening and ground-glass  opacity in the right upper lobe is likely from vascular/lymphatic obstruction Upper Abdomen: No acute finding. Polycystic kidneys. Known left adrenal mass that is hypermetabolic by CT. Musculoskeletal: L1-2 advanced disc disease with sclerosis and subchondral cysts, degenerative appearing. No acute or aggressive finding. Review of the MIP images confirms the above findings. IMPRESSION: 1. Two non hypertrophied bronchial arteries identified. 2. Known right upper lobe malignancy with airway obstruction and bronchial artery/vein narrowing. Material cast the right lower lobe and right middle lobe airways, new from 01/25/2016, presumably clot in this setting. The right lower lobe is completely collapsed. 3. Chronic mild bronchiolitis changes in the left upper lobe. No left-sided source of hemoptysis identified. 4. Ascending aortic aneurysm with aberrant right subclavian artery. Electronically Signed   By: Monte Fantasia M.D.   On: 02/18/2016 12:32   US Thoracentesis Asp Pleural Space W/img Guide  Result Date: 02/18/2016 INDICATION: Lung cancer. Shortness of breath. Right-sided pleural effusion. Request diagnostic and therapeutic thoracentesis. EXAM: ULTRASOUND GUIDED RIGHT THORACENTESIS MEDICATIONS: None. COMPLICATIONS: None immediate. Postprocedural chest x-ray negative for pneumothorax. PROCEDURE: An ultrasound guided thoracentesis was thoroughly discussed with the patient and questions answered. The benefits, risks, alternatives and complications were also discussed. The patient understands and wishes to proceed with the procedure. Written consent was obtained. Ultrasound was performed to localize and mark an adequate pocket of fluid in the right chest. The area was then prepped and draped in the normal sterile fashion. 1% Lidocaine was used for local anesthesia. Under ultrasound guidance a Safe-T-Centesis catheter was introduced. Thoracentesis was performed. The catheter was removed and a dressing applied.  FINDINGS: A total of approximately 750 mL  of clear yellow fluid was removed. Samples were sent to the laboratory as requested by the clinical team. IMPRESSION: Successful ultrasound guided right thoracentesis yielding 750 mL of pleural fluid. Read by: Ascencion Dike PA-C Electronically Signed   By: Corrie Mckusick D.O.   On: 02/18/2016 11:00    Subjective: Eager to go home  Discharge Exam: Vitals:   02/21/16 2025 02/22/16 0618  BP: (!) 107/59 115/72  Pulse: 91 96  Resp: 18 18  Temp: 97.6 F (36.4 C) 98 F (36.7 C)   Vitals:   02/20/16 2000 02/21/16 0635 02/21/16 2025 02/22/16 0618  BP: (!) 117/91 (!) 159/86 (!) 107/59 115/72  Pulse: (!) 56 94 91 96  Resp: '20 18 18 18  '$ Temp: 97.5 F (36.4 C) 99.5 F (37.5 C) 97.6 F (36.4 C) 98 F (36.7 C)  TempSrc: Oral Oral Oral Oral  SpO2: 100% 99% 96% 97%  Weight:      Height:        General: Pt is alert, awake, not in acute distress Cardiovascular: RRR, S1/S2 +, no rubs, no gallops Respiratory: CTA bilaterally, no wheezing, no rhonchi Abdominal: Soft, NT, ND, bowel sounds + Extremities: no edema, no cyanosis   The results of significant diagnostics from this hospitalization (including imaging, microbiology, ancillary and laboratory) are listed below for reference.     Microbiology: No results found for this or any previous visit (from the past 240 hour(s)).   Labs: BNP (last 3 results)  Recent Labs  12/27/15 1221 01/10/16 1338 02/12/16 0927  BNP 279.2* 353.2* 923.3*   Basic Metabolic Panel:  Recent Labs Lab 02/16/16 1212 02/17/16 2312 02/18/16 0640 02/19/16 0223 02/20/16 0204  NA 137 133* 134* 134* 133*  K 4.6 4.2 4.1 3.8 4.1  CL  --  92* 92* 95* 94*  CO2 31* 33* 36* 32 34*  GLUCOSE 86 110* 95 112* 128*  BUN 29.4* 37* 38* 42* 41*  CREATININE 0.8 0.88 0.79 0.92 0.97  CALCIUM 10.6* 10.0 9.5 9.1 9.3   Liver Function Tests:  Recent Labs Lab 02/16/16 1212  AST 20  ALT 26  ALKPHOS 55  BILITOT 0.66  PROT 6.4   ALBUMIN 3.6   No results for input(s): LIPASE, AMYLASE in the last 168 hours. No results for input(s): AMMONIA in the last 168 hours. CBC:  Recent Labs Lab 02/16/16 1212  02/17/16 2312 02/18/16 0640 02/19/16 0223 02/20/16 0204 02/21/16 0520 02/22/16 0430  WBC 6.4  < > 9.2 6.3 6.2 8.0 7.9 8.9  NEUTROABS 5.3  --  7.8*  --   --   --   --   --   HGB 12.5  < > 12.0 10.9* 10.3* 10.7* 10.8* 10.4*  HCT 38.8  < > 37.4 34.3* 32.1* 33.0* 34.1* 32.8*  MCV 100.9  < > 101.9* 103.0* 100.0 102.5* 103.0* 100.3*  PLT 268  < > 286 231 187 194 204 203  < > = values in this interval not displayed. Cardiac Enzymes: No results for input(s): CKTOTAL, CKMB, CKMBINDEX, TROPONINI in the last 168 hours. BNP: Invalid input(s): POCBNP CBG: No results for input(s): GLUCAP in the last 168 hours. D-Dimer No results for input(s): DDIMER in the last 72 hours. Hgb A1c No results for input(s): HGBA1C in the last 72 hours. Lipid Profile No results for input(s): CHOL, HDL, LDLCALC, TRIG, CHOLHDL, LDLDIRECT in the last 72 hours. Thyroid function studies No results for input(s): TSH, T4TOTAL, T3FREE, THYROIDAB in the last 72 hours.  Invalid input(s): FREET3  Anemia work up No results for input(s): VITAMINB12, FOLATE, FERRITIN, TIBC, IRON, RETICCTPCT in the last 72 hours. Urinalysis    Component Value Date/Time   COLORURINE YELLOW 12/27/2015 1254   APPEARANCEUR CLEAR 12/27/2015 1254   LABSPEC 1.007 12/27/2015 1254   LABSPEC 1.010 03/22/2015 0917   PHURINE 6.5 12/27/2015 1254   GLUCOSEU NEGATIVE 12/27/2015 1254   GLUCOSEU Negative 03/22/2015 0917   HGBUR NEGATIVE 12/27/2015 1254   BILIRUBINUR NEGATIVE 12/27/2015 1254   BILIRUBINUR Negative 03/22/2015 0917   KETONESUR NEGATIVE 12/27/2015 1254   PROTEINUR NEGATIVE 12/27/2015 1254   UROBILINOGEN 0.2 03/22/2015 0917   NITRITE NEGATIVE 12/27/2015 1254   LEUKOCYTESUR NEGATIVE 12/27/2015 1254   LEUKOCYTESUR Negative 03/22/2015 0917   Sepsis Labs Invalid  input(s): PROCALCITONIN,  WBC,  LACTICIDVEN Microbiology No results found for this or any previous visit (from the past 240 hour(s)).   SIGNED:   Donne Hazel, MD  [facility] 02/22/2016, 6:12 PM  If 7PM-7AM, please contact night-coverage [email or web address] Password [email or web address]

## 2016-02-22 NOTE — Progress Notes (Signed)
   Name: Deborah [name] MRN: [id number] DOB: Aug 12, 1926    ADMISSION DATE:  02/17/2016 CONSULTATION DATE:  02/18/16  REFERRING MD :  Elgergawy  CHIEF COMPLAINT:  hemoptysis  BRIEF PATIENT DESCRIPTION: 34F w/ recently dx extensive stage SCLC, hx CHF, prior breast ca, GERD, mechanical aortic valve on a/c, HLD, HTN, hypothyroidism presenting with worsening hemoptysis (1 Tbs every 1-2 h) x 24h  SIGNIFICANT EVENTS  11/18 - Admit 11/20 - Started XRT  STUDIES:  CXR 02/17/16 R effusion; otherwise largely clear.   SUBJECTIVE: Scant streak heme in clear mucus. Eager to get home.  REVIEW OF SYSTEMS:  No nausea or emesis. No fever or chills. No headaches.  VITAL SIGNS: Temp:  [97.6 F (36.4 C)-98 F (36.7 C)] 98 F (36.7 C) (11/23 0618) Pulse Rate:  [91-96] 96 (11/23 0618) Resp:  [18] 18 (11/23 0618) BP: (107-115)/(59-72) 115/72 (11/23 0618) SpO2:  [96 %-97 %] 97 % (11/23 0618)  PHYSICAL EXAMINATION: General:  Awake. Alert. No distress. Sitting up in chair with family playing cards. Integument:  Warm & dry. No rash on exposed skin.  HEENT:  Moist mucus membranes. No scleral injection or icterus.  Cardiovascular:  Irregular rate and rhythm. Mechanical murmur again appreciated. Pulmonary:  Clear with auscultation bilaterally. Normal work of breathing on nasal cannula oxygen. Abdomen: Soft. Normal bowel sounds.  Musculoskeletal:  Normal bulk and tone. No joint deformity or effusion appreciated.   Recent Labs Lab 02/18/16 0640 02/19/16 0223 02/20/16 0204  NA 134* 134* 133*  K 4.1 3.8 4.1  CL 92* 95* 94*  CO2 36* 32 34*  BUN 38* 42* 41*  CREATININE 0.79 0.92 0.97  GLUCOSE 95 112* 128*    Recent Labs Lab 02/20/16 0204 02/21/16 0520 02/22/16 0430  HGB 10.7* 10.8* 10.4*  HCT 33.0* 34.1* 32.8*  WBC 8.0 7.9 8.9  PLT 194 204 203   No results found.RLL ATX tumor in right bronchus.   ASSESSMENT / PLAN:  82 y.o. 82 year old female with extensive stage small cell lung  cancer and ongoing hemoptysis. Has successfully transitioned over to heparin infusion for systemic anticoagulation. Hemoptysis has resolved. Patient is transitioning over to Lovenox for systemic anticoagulation. Explained to the patient that I am concerned her hemoptysis could recur.  She was getting lovenox injections at [place] and 8P at [place] by nurse there and can go back to that. I would support discharge home for family holiday.  1. Hemoptysis: Temporarily resolved. Monitoring closely on Lovenox for anticoagulation. If recurs would need evaluation by interventional radiology for possible embolization. 2. Extensive stage small cell lung cancer: Palliative radiation therapy. Previously refused chemotherapy. 3. Mechanical aortic valve: Transitioning from Heparin drip to Lovenox for systemic antioagulation. I would favor holding off on restarting oral Coumadin at this time. If no significant heme over next week or 2 on Lovenox, then could consider return to coumadin, but she is likely to bleed again eventually.  Bartholomew Crews, M.D. [facility] Pager:  [phone] After 3pm or if no response, call [phone] 02/22/2016, 10:30 AM

## 2016-02-26 ENCOUNTER — Telehealth: Payer: Self-pay | Admitting: Emergency Medicine

## 2016-02-26 ENCOUNTER — Other Ambulatory Visit (HOSPITAL_BASED_OUTPATIENT_CLINIC_OR_DEPARTMENT_OTHER): Payer: Medicare Other

## 2016-02-26 ENCOUNTER — Ambulatory Visit
Admission: RE | Admit: 2016-02-26 | Discharge: 2016-02-26 | Disposition: A | Discharge status: Home / Self Care | Payer: Medicare Other | Source: Ambulatory Visit | Attending: Radiation Oncology | Admitting: Radiation Oncology

## 2016-02-26 ENCOUNTER — Ambulatory Visit: Payer: Medicare Other | Attending: Radiation Oncology | Admitting: Radiation Oncology

## 2016-02-26 DIAGNOSIS — Z8249 Family history of ischemic heart disease and other diseases of the circulatory system: Secondary | ICD-10-CM | POA: Diagnosis not present

## 2016-02-26 DIAGNOSIS — Z923 Personal history of irradiation: Secondary | ICD-10-CM | POA: Diagnosis not present

## 2016-02-26 DIAGNOSIS — E039 Hypothyroidism, unspecified: Secondary | ICD-10-CM | POA: Diagnosis not present

## 2016-02-26 DIAGNOSIS — I482 Chronic atrial fibrillation: Secondary | ICD-10-CM | POA: Diagnosis not present

## 2016-02-26 DIAGNOSIS — R590 Localized enlarged lymph nodes: Secondary | ICD-10-CM | POA: Diagnosis not present

## 2016-02-26 DIAGNOSIS — I110 Hypertensive heart disease with heart failure: Secondary | ICD-10-CM | POA: Diagnosis not present

## 2016-02-26 DIAGNOSIS — Z952 Presence of prosthetic heart valve: Secondary | ICD-10-CM | POA: Diagnosis not present

## 2016-02-26 DIAGNOSIS — Z803 Family history of malignant neoplasm of breast: Secondary | ICD-10-CM | POA: Diagnosis not present

## 2016-02-26 DIAGNOSIS — E7800 Pure hypercholesterolemia, unspecified: Secondary | ICD-10-CM | POA: Diagnosis not present

## 2016-02-26 DIAGNOSIS — M109 Gout, unspecified: Secondary | ICD-10-CM | POA: Diagnosis not present

## 2016-02-26 DIAGNOSIS — C3401 Malignant neoplasm of right main bronchus: Secondary | ICD-10-CM

## 2016-02-26 DIAGNOSIS — K219 Gastro-esophageal reflux disease without esophagitis: Secondary | ICD-10-CM | POA: Diagnosis not present

## 2016-02-26 DIAGNOSIS — R042 Hemoptysis: Secondary | ICD-10-CM | POA: Diagnosis not present

## 2016-02-26 DIAGNOSIS — D62 Acute posthemorrhagic anemia: Secondary | ICD-10-CM

## 2016-02-26 DIAGNOSIS — I119 Hypertensive heart disease without heart failure: Secondary | ICD-10-CM

## 2016-02-26 DIAGNOSIS — Z801 Family history of malignant neoplasm of trachea, bronchus and lung: Secondary | ICD-10-CM | POA: Diagnosis not present

## 2016-02-26 DIAGNOSIS — Z8673 Personal history of transient ischemic attack (TIA), and cerebral infarction without residual deficits: Secondary | ICD-10-CM | POA: Diagnosis not present

## 2016-02-26 DIAGNOSIS — Z9889 Other specified postprocedural states: Secondary | ICD-10-CM | POA: Diagnosis not present

## 2016-02-26 DIAGNOSIS — J189 Pneumonia, unspecified organism: Secondary | ICD-10-CM | POA: Diagnosis not present

## 2016-02-26 DIAGNOSIS — K922 Gastrointestinal hemorrhage, unspecified: Secondary | ICD-10-CM

## 2016-02-26 DIAGNOSIS — D689 Coagulation defect, unspecified: Secondary | ICD-10-CM

## 2016-02-26 DIAGNOSIS — Z90710 Acquired absence of both cervix and uterus: Secondary | ICD-10-CM | POA: Diagnosis not present

## 2016-02-26 DIAGNOSIS — I4819 Other persistent atrial fibrillation: Secondary | ICD-10-CM

## 2016-02-26 DIAGNOSIS — I509 Heart failure, unspecified: Secondary | ICD-10-CM | POA: Diagnosis not present

## 2016-02-26 DIAGNOSIS — D500 Iron deficiency anemia secondary to blood loss (chronic): Secondary | ICD-10-CM

## 2016-02-26 DIAGNOSIS — Z853 Personal history of malignant neoplasm of breast: Secondary | ICD-10-CM | POA: Diagnosis not present

## 2016-02-26 DIAGNOSIS — Z825 Family history of asthma and other chronic lower respiratory diseases: Secondary | ICD-10-CM | POA: Diagnosis not present

## 2016-02-26 DIAGNOSIS — Z9049 Acquired absence of other specified parts of digestive tract: Secondary | ICD-10-CM | POA: Diagnosis not present

## 2016-02-26 DIAGNOSIS — Z800 Family history of malignant neoplasm of digestive organs: Secondary | ICD-10-CM | POA: Diagnosis not present

## 2016-02-26 DIAGNOSIS — Z510 Encounter for antineoplastic radiation therapy: Secondary | ICD-10-CM | POA: Diagnosis not present

## 2016-02-26 DIAGNOSIS — K921 Melena: Secondary | ICD-10-CM

## 2016-02-26 LAB — CBC WITH DIFFERENTIAL/PLATELET
BASO%: 0 % (ref 0.0–2.0)
BASOS ABS: 0 10*3/uL (ref 0.0–0.1)
EOS%: 0 % (ref 0.0–7.0)
Eosinophils Absolute: 0 10*3/uL (ref 0.0–0.5)
HCT: 36.5 % (ref 34.8–46.6)
HGB: 11.5 g/dL — ABNORMAL LOW (ref 11.6–15.9)
LYMPH%: 3.7 % — ABNORMAL LOW (ref 14.0–49.7)
MCH: 32.8 pg (ref 25.1–34.0)
MCHC: 31.5 g/dL (ref 31.5–36.0)
MCV: 104 fL — ABNORMAL HIGH (ref 79.5–101.0)
MONO#: 0 10*3/uL — ABNORMAL LOW (ref 0.1–0.9)
MONO%: 0.4 % (ref 0.0–14.0)
NEUT#: 8.1 10*3/uL — ABNORMAL HIGH (ref 1.5–6.5)
NEUT%: 95.9 % — AB (ref 38.4–76.8)
Platelets: 192 10*3/uL (ref 145–400)
RBC: 3.51 10*6/uL — ABNORMAL LOW (ref 3.70–5.45)
RDW: 16.3 % — ABNORMAL HIGH (ref 11.2–14.5)
WBC: 8.4 10*3/uL (ref 3.9–10.3)
lymph#: 0.3 10*3/uL — ABNORMAL LOW (ref 0.9–3.3)

## 2016-02-26 NOTE — Telephone Encounter (Signed)
LMOMTCB x 1 

## 2016-02-27 ENCOUNTER — Ambulatory Visit
Admission: RE | Admit: 2016-02-27 | Discharge: 2016-02-27 | Disposition: A | Discharge status: Home / Self Care | Payer: Medicare Other | Source: Ambulatory Visit | Attending: Radiation Oncology | Admitting: Radiation Oncology

## 2016-02-27 ENCOUNTER — Encounter: Payer: Self-pay | Admitting: Cardiovascular Disease

## 2016-02-27 ENCOUNTER — Ambulatory Visit (INDEPENDENT_AMBULATORY_CARE_PROVIDER_SITE_OTHER): Payer: Medicare Other | Admitting: Cardiovascular Disease

## 2016-02-27 VITALS — BP 115/66 | HR 79 | Ht 64.0 in | Wt 119.4 lb

## 2016-02-27 DIAGNOSIS — Z510 Encounter for antineoplastic radiation therapy: Secondary | ICD-10-CM | POA: Diagnosis not present

## 2016-02-27 DIAGNOSIS — I110 Hypertensive heart disease with heart failure: Secondary | ICD-10-CM

## 2016-02-27 DIAGNOSIS — I712 Thoracic aortic aneurysm, without rupture: Secondary | ICD-10-CM

## 2016-02-27 DIAGNOSIS — I7121 Aneurysm of the ascending aorta, without rupture: Secondary | ICD-10-CM

## 2016-02-27 DIAGNOSIS — E785 Hyperlipidemia, unspecified: Secondary | ICD-10-CM

## 2016-02-27 DIAGNOSIS — I6330 Cerebral infarction due to thrombosis of unspecified cerebral artery: Secondary | ICD-10-CM | POA: Diagnosis not present

## 2016-02-27 DIAGNOSIS — I482 Chronic atrial fibrillation: Secondary | ICD-10-CM

## 2016-02-27 DIAGNOSIS — C3491 Malignant neoplasm of unspecified part of right bronchus or lung: Secondary | ICD-10-CM | POA: Diagnosis not present

## 2016-02-27 DIAGNOSIS — I5031 Acute diastolic (congestive) heart failure: Secondary | ICD-10-CM

## 2016-02-27 DIAGNOSIS — I4821 Permanent atrial fibrillation: Secondary | ICD-10-CM

## 2016-02-27 DIAGNOSIS — Z954 Presence of other heart-valve replacement: Secondary | ICD-10-CM

## 2016-02-27 LAB — FERRITIN: FERRITIN: 148 ng/mL (ref 9–269)

## 2016-02-27 NOTE — Progress Notes (Signed)
d   Cardiology Office Note   Date:  02/27/2016   ID:  Deborah [name], DOB 1926-05-16, MRN [id number]  PCP:  Mathews Argyle, MD  Cardiologist:   Skeet Latch, MD  GI: Dr. Sarina Ser  Chief Complaint  Patient presents with  . Hospitalization Follow-up     History of Present Illness: Deborah [name] is an 82 y.o. female with small cell lung cancer undergoing XRT, hypertension, hyperlipidemia, ascending aortic aneurysm, permanent atrial fibrillation, stroke, hypothyroidism, prior mechanical aortic valve replacement, and NSVT who presents for follow-up.  Deborah [name] was previously a patient of Dr. Mare Ferrari.  Deborah [name] previously developed a retroperitoneal hematoma on warfarin in September 2006 and was temporarily switched to Lovenox. However she was transitioned back to warfarin. Deborah [name] underwent upper endoscopy on 07/25/15 that showed a small hiatal hernia.  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  Her anticoagulation is managed by her oncologist, Dr. Doreen Salvage.  Her INR goal has been 2-2.5. She continues following up with her oncologist for management of her anemia and requires frequent infusion of IV iron and transfusions. She had a Lexiscan Cardiolite 04/2015 that was negative for ischemia. Her echo in 04/2015 showed mild to moderate aortic stenosis across her mechanical aortic valve (mean gradient 21 mmHg).  She had a subsequent echo 12/2015 due to recurrent shortness of breath.  The mean gradient was 18 mmHg and it was otherwise unchanged.    She noted increased swelling and shortness of breath and was seen by Deborah [name] on 11/23/15.  At that time she had a BNP that was 331 and potassium was 3.3.  She was started on lasix and HCTZ was discontinued. This improved her edema but she developed worsened episodes of hypertension. She was started on clonidine which helped her blood pressure.  She had a CT scan  looking for PE that revealed a large right hilar soft tissue mass with invasion into the mediastinum. The mass encased and narrowed the distal right main pulmonary artery and the branch vessels to the right upper lobe.  She was ultimately diagnosed with small cell lung cancer and is half way through XRT.   She was also noted to have an ascending aortic aneurysm measuring 4.7 cm in diameter.  She was seen in clinic 02/16/16 with chest pain that was thought unlikely to be ischemia.  She notes that her breathing has improved since starting radiation.  She did have an episode of hemoptysis that required admission 11/18-11/23.  Her anticoagulation was switched to heparin and then back to Lovenox prior to discharge.  She denies recurrent bleeding.  She also underwent throacentesis 11/19 with removal of 779m.   Since discharge Ms. GPettijohnnotes that her BP has been low.  She hasn't taken clonidine in the last 10 days.  Her BP has been as low as 1081mmHg systolic.  She has been feeling lightheaded and dizzy but denies syncope.   Ms. GEnckhas not tolerated hydralazine due to abdominal discomfort. She develops following with higher doses of amlodipine. She developed bradycardia with higher doses of beta blocker.    Past Medical History:  Diagnosis Date  . Acute CHF (congestive heart failure) ([place] 01/10/2016  . Anemia   . Arthritis    "fingers, toes, hips; qwhere" (05/19/2013)  . Breast [place]   "right; S/P lumpectomy, chemo, XRT"   . Chronic lower GI bleeding   . Complication of anesthesia    "  once I'm awakened, I don't sleep til the following night" BP drops very low had to stop putting in [place] -a- cath  . GERD (gastroesophageal reflux disease)   . Gout   . Heart murmur   . High cholesterol   . History of blood transfusion 1996; ~ 2013   "related to valve replacement; GI bleeding"   . History of breast cancer 02/16/2016  . History of echocardiogram    Echo 1/17: mild LVH, EF  50-55%, no RWMA, mild AI, mechanical AVR with mean 21 mmHg/peak 42 mmHg (stable since 2012), mod MR, mod to severe BAE, PASP 36 mmHg  . History of nuclear stress test    Myoview 1/17: EF 59%, normal perfusion, low risk  . Hypertension   . Hypothyroidism   . Lung cancer ([place]) dx'd 01/2016   SCL  . On supplemental oxygen therapy   . Permanent atrial fibrillation ([place])       . Pneumonia 07/2011  . Rectus sheath hematoma 12/19/14    held anticoagulation for 1 week, transfused  . Small cell lung cancer, right ([place]) 02/16/2016  . Stomach problems    seeing Dr Watt Climes  . TIA (transient ischemic attack) 12/24/14   symptoms resolved    Past Surgical History:  Procedure Laterality Date  . ABDOMINAL HYSTERECTOMY    . AORTIC VALVE REPLACEMENT  1996   [employer]  . APPENDECTOMY    . arteriogram  01/12   NO RAS   . BREAST BIOPSY Right   . BREAST LUMPECTOMY Right   . CARDIAC CATHETERIZATION    . CARDIOVERSION     X 2  . CATARACT EXTRACTION W/ INTRAOCULAR LENS  IMPLANT, BILATERAL Bilateral    bilateral cataract with lens implants  . CHOLECYSTECTOMY    . COLONOSCOPY WITH PROPOFOL N/A 11/24/2012   Procedure: COLONOSCOPY WITH PROPOFOL;  Surgeon: Jeryl Columbia, MD;  Location: [facility] ENDOSCOPY;  Service: Endoscopy;  Laterality: N/A;  . ESOPHAGOGASTRODUODENOSCOPY (EGD) WITH PROPOFOL N/A 07/25/2015   Procedure: ESOPHAGOGASTRODUODENOSCOPY (EGD) WITH PROPOFOL;  Surgeon: Clarene Essex, MD;  Location[facility] ENDOSCOPY;  Service: Endoscopy;  Laterality: N/A;  . HOT HEMOSTASIS N/A 07/25/2015   Procedure: HOT HEMOSTASIS (ARGON PLASMA COAGULATION/BICAP);  Surgeon: Clarene Essex, MD;  Location: [facility] ENDOSCOPY;  Service: Endoscopy;  Laterality: N/A;  . VIDEO BRONCHOSCOPY WITH ENDOBRONCHIAL ULTRASOUND N/A 02/07/2016   Procedure: VIDEO BRONCHOSCOPY WITH ENDOBRONCHIAL ULTRASOUND;  Surgeon: Collene Gobble, MD;  Location: [facility] OR;  Service: Thoracic;  Laterality: N/A;     Current Outpatient Prescriptions  Medication Sig Dispense Refill   . acetaminophen (TYLENOL) 500 MG tablet Take 1,000 mg by mouth every 6 (six) hours as needed for mild pain, moderate pain or headache. Reported on 09/26/2015    . allopurinol (ZYLOPRIM) 300 MG tablet Take 300 mg by mouth at bedtime.     . calcium-vitamin D 500 MG tablet Take 1 tablet by mouth 2 (two) times daily. Reported on 09/26/2015    . carvedilol (COREG) 6.25 MG tablet TAKE 6.25 MG BY MOUTH TWICE DAILY 60 tablet 1  . celecoxib (CELEBREX) 200 MG capsule Take 200 mg by mouth daily as needed for moderate pain.   4  . dexamethasone (DECADRON) 4 MG tablet Take 2 tablets (8 mg total) by mouth 2 (two) times daily with a meal. 40 tablet 3  . dicyclomine (BENTYL) 10 MG capsule Take 10 mg by mouth daily as needed for spasms.     Marland Kitchen enoxaparin (LOVENOX) 100 MG/ML injection Inject 0.5 mLs (50 mg  total) into the skin every 12 (twelve) hours. Please dispense 2 week supply, zero refills 0 Syringe   . fexofenadine (ALLEGRA ALLERGY) 180 MG tablet Take 180 mg by mouth daily.     . furosemide (LASIX) 40 MG tablet Take 1-2 tablets daily as directed. (Patient taking differently: Take 40 mg by mouth 2 (two) times daily as needed for fluid. ) 45 tablet 6  . hydrOXYzine (ATARAX/VISTARIL) 25 MG tablet Take 25 mg by mouth 3 (three) times daily as needed for anxiety.    Marland Kitchen levothyroxine (SYNTHROID, LEVOTHROID) 88 MCG tablet Take 1 tablet (88 mcg total) by mouth daily before breakfast. 90 tablet 3  . LORazepam (ATIVAN) 0.5 MG tablet Take 1 tablet (0.5 mg total) by mouth every 8 (eight) hours as needed for anxiety. 30 tablet 0  . NITROSTAT 0.4 MG SL tablet Place 1 tablet (0.4 mg total) under the tongue every 5 (five) minutes as needed for chest pain (x 3 doses). Reported on 10/17/2015 25 tablet 5  . OXYGEN Inhale 2 L into the lungs continuous.    . potassium chloride SA (K-DUR,KLOR-CON) 20 MEQ tablet Take 0.5 tablets (10 mEq total) by mouth 2 (two) times daily as needed (taking with Lasix).    . Probiotic Product (PROBIOTIC  ADVANCED PO) Take 1 tablet by mouth daily. Reported on 10/17/2015    . simvastatin (ZOCOR) 20 MG tablet Take 10 mg by mouth daily.     . traMADol (ULTRAM) 50 MG tablet Take 1 tablet (50 mg total) by mouth every 6 (six) hours as needed. (Patient taking differently: Take 50 mg by mouth every 6 (six) hours as needed for moderate pain or severe pain. ) 30 tablet 0  . valsartan (DIOVAN) 320 MG tablet Take 1 tablet (320 mg total) by mouth daily. 30 tablet 5  . zolpidem (AMBIEN) 5 MG tablet Take 5 mg by mouth at bedtime.      No current facility-administered medications for this visit.     Allergies:   Alendronate sodium; Amiodarone; Chlorthalidone; Macrodantin [nitrofurantoin]; Meclizine; Norvasc [amlodipine besylate]; Hydralazine hcl; Sulfa antibiotics; and Tussionex pennkinetic er [hydrocod polst-cpm polst er]    Social History:  The patient  reports that she quit smoking about 37 years ago. Her smoking use included Cigarettes. She has a 20.00 pack-year smoking history. She has never used smokeless tobacco. She reports that she drinks about 4.8 oz of alcohol per week . She reports that she does not use drugs.   Family History:  The patient's family history includes Breast cancer in her daughter; COPD in her brother; Colon cancer in her sister; Coronary artery disease in her father; Dementia in her mother; Heart attack in her father; Liver cancer in her sister; Lung cancer in her brother.    ROS:  Please see the history of present illness.   Otherwise, review of systems are positive for none.   All other systems are reviewed and negative.    PHYSICAL EXAM: VS:  BP 115/66   Pulse 79   Ht '5\' 4"'$  (1.626 m)   Wt 54.2 kg (119 lb 6.4 oz)   BMI 20.49 kg/m  , BMI Body mass index is 20.49 kg/m. GENERAL:  Chronically ill-appearing HEENT:  Pupils equal round and reactive, fundi not visualized, oral mucosa unremarkable.  Conjuntival pallor.  NECK:  JVP at the clavicle at 45 degrees. Waveform within normal  limits, carotid upstroke brisk and symmetric, no bruits LYMPHATICS:  No cervical adenopathy LUNGS:  Clear to auscultation bilaterally HEART:  Irregularly irregular. PMI not displaced or sustained,S1 within normal limits, mechanical S2, no S3, no S4, no clicks, no rubs, II/VI systolic murmur. ABD:  Distended, positive bowel sounds normal in frequency in pitch, no bruits, no rebound, no guarding, no midline pulsatile mass, no hepatomegaly, no splenomegaly EXT:  2 plus pulses throughout, no edema, no cyanosis no clubbing SKIN:  No rashes no nodules NEURO:  Cranial nerves II through XII grossly intact, motor grossly intact throughout PSYCH:  Cognitively intact, oriented to person place and time  EKG:  EKG is not ordered today. The ekg ordered 06/30/15 demonstrates atrial bigeminy.  Rate 75 bpm.  Prior septal infarct.  Lateral ST-T changes consistent with ischemia.   Echo 04/05/15: Study Conclusions  - Left ventricle: The cavity size was normal. There was mild   concentric hypertrophy. Systolic function was normal. The   estimated ejection fraction was in the range of 50% to 55%. Wall   motion was normal; there were no regional wall motion   abnormalities. The study is not technically sufficient to allow   evaluation of LV diastolic function. - Aortic valve: A mechanical prosthesis was present. The gradients   across the prosthesis are similar to the study of 2012. There was   mild regurgitation. Peak velocity (S): 325 cm/s. Mean gradient   (S): 21 mm Hg. - Mitral valve: There was moderate regurgitation. - Left atrium: The atrium was moderately to severely dilated. - Right ventricle: The cavity size was moderately dilated. Wall   thickness was normal. - Right atrium: The atrium was moderately to severely dilated. - Pulmonary arteries: Systolic pressure was mildly increased. PA   peak pressure: 36 mm Hg (S).   Recent Labs: 02/12/2016: B Natriuretic Peptide 249.4 02/16/2016: ALT  26 02/20/2016: BUN 41; Creatinine, Ser 0.97; Potassium 4.1; Sodium 133 02/26/2016: HGB 11.5; Platelets 192    Lipid Panel    Component Value Date/Time   CHOL 153 11/08/2015 0959   TRIG 139 11/08/2015 0959   HDL 59 11/08/2015 0959   CHOLHDL 2.5 12/26/2014 0356   VLDL 19 12/26/2014 0356   LDLCALC 66 11/08/2015 0959   LDLDIRECT 93.7 04/10/2011 1000      Wt Readings from Last 3 Encounters:  02/27/16 54.2 kg (119 lb 6.4 oz)  02/17/16 55.3 kg (122 lb)  02/16/16 55.3 kg (122 lb)      ASSESSMENT AND PLAN:  # Hypertension: Blood pressure has been low lately.  She hasn't been taking clonidine.  She reports that she is eating and drinking well.  We will stop amlodipine.  She will continue carvedilol and valsartan.  However, if her SBP is <120 mmHg, she won't take the valsartan.  # Chronic diastolic heart failure:  Deborah [name] seems to be euvolemic today.  Her breathing is the best it has been in a while.  Continue furosemide and BP control as above.  # Permanent atrial fibrillation: Remains in atrial fibrillation and rates are well-controlled.  Continue carvedilol.  She is currently on lovenox Instead of warfarin, given her hemoptysis.  Agree with continuing this for at least 2 weeks after discharge.  This is managed by Dr. Jana Hakim.  Her INR goal is 2-2.5 on warfarin.  This patients CHA2DS2-VASc Score and unadjusted Ischemic Stroke Rate (% per year) is equal to 4.8 % stroke rate/year from a score of 4  Above score calculated as 1 point each if present [CHF, HTN, DM, Vascular=MI/PAD/Aortic Plaque, Age if 65-74, or Female] Above score calculated as 2 points each  if present [Age > 75, or Stroke/TIA/TE]   # s/p mechanical AVR: Stable with mildly increased gradients.  Mean gradient was 18 mmHg on echo 12/2015. INR goal 2-2.5 due to GI bleeding requiring transfusions.  Currently on Lovenox as above.  # Hyperlipidemia: LDL 66 10/2015.  # Ascending arorta aneurysm: 4.7 cm on CT.  No  intervention.    # Small cell lung cancer: She is currently undergoing palliative XRT.  She is more interested in quality of life than quantity and probably doesn't want chemotherapy.  Current medicines are reviewed at length with the patient today.  The patient does not have concerns regarding medicines.  The following changes have been made:  None  Labs/ tests ordered today include:   No orders of the defined types were placed in this encounter.  Time spent: 45 minutes-Greater than 50% of this time was spent in counseling, explanation of diagnosis, planning of further management, and coordination of care.   Disposition:   FU with Deborah [name] C. Oval Linsey, MD, [facility] in 1 month   This note was written with the assistance of speech recognition software.  Please excuse any transcriptional errors.  Signed, Umair Rosiles C. Oval Linsey, MD, [facility]  02/27/2016 5:18 PM    [facility]

## 2016-02-27 NOTE — Patient Instructions (Signed)
Medication Instructions:  STOP NORVASC (AMLODIPINE)  STOP CLONIDINE   IF YOUR TOP NUMBER OF BLOOD PRESSURE IS LESS THAN 120 THAN HOLD YOUR VALSARTAN   Labwork: NONE  Testing/Procedures: NONE  Follow-Up: Your physician recommends that you schedule a follow-up appointment in: 1 MONTH OV  If you need a refill on your cardiac medications before your next appointment, please call your pharmacy.

## 2016-02-27 NOTE — Telephone Encounter (Signed)
lmtcb x2 for pt's daughter, [name].

## 2016-02-28 ENCOUNTER — Ambulatory Visit
Admission: RE | Admit: 2016-02-28 | Discharge: 2016-02-28 | Disposition: A | Discharge status: Home / Self Care | Payer: Medicare Other | Source: Ambulatory Visit | Attending: Radiation Oncology | Admitting: Radiation Oncology

## 2016-02-28 ENCOUNTER — Telehealth: Payer: Self-pay | Admitting: Emergency Medicine

## 2016-02-28 DIAGNOSIS — Z510 Encounter for antineoplastic radiation therapy: Secondary | ICD-10-CM | POA: Diagnosis not present

## 2016-02-28 NOTE — Telephone Encounter (Signed)
lmtcb x3 for pt's daughter, [name]. Per triage protocol, message will be closed.

## 2016-02-28 NOTE — Telephone Encounter (Signed)
Spoke with pt's daughter [name] (dpr on file) wants to know if pt can d/c dexamethasone given in hospital- pt has been taking 2-'4mg'$  tabs bid, a few days ago weaned herself down to 1 tab bid d/t side effect of making her very hungry.  Pt is scheduled for ov on 12/1 with RB.  RB please advise on recs.  Thanks.

## 2016-02-28 NOTE — Telephone Encounter (Signed)
After reviewing the chart, unclear to me why she is on this medication, why it was started. I think it is OK to wean the medication to once a day for 3 days and then stop it.

## 2016-02-29 ENCOUNTER — Ambulatory Visit
Admission: RE | Admit: 2016-02-29 | Discharge: 2016-02-29 | Disposition: A | Discharge status: Home / Self Care | Payer: Medicare Other | Source: Ambulatory Visit | Attending: Radiation Oncology | Admitting: Radiation Oncology

## 2016-02-29 DIAGNOSIS — Z510 Encounter for antineoplastic radiation therapy: Secondary | ICD-10-CM | POA: Diagnosis not present

## 2016-02-29 NOTE — Telephone Encounter (Signed)
Pt daughter called back and I gave her the info from [place] and she was fine w/that.Deborah [name]

## 2016-02-29 NOTE — Telephone Encounter (Signed)
lmomtcb x 1 for the pts daughter Dixon Boos.

## 2016-03-01 ENCOUNTER — Ambulatory Visit
Admission: RE | Admit: 2016-03-01 | Discharge: 2016-03-01 | Disposition: A | Discharge status: Home / Self Care | Payer: Medicare Other | Source: Ambulatory Visit | Attending: Radiation Oncology | Admitting: Radiation Oncology

## 2016-03-01 ENCOUNTER — Ambulatory Visit (INDEPENDENT_AMBULATORY_CARE_PROVIDER_SITE_OTHER): Payer: Medicare Other | Admitting: Emergency Medicine

## 2016-03-01 ENCOUNTER — Encounter: Payer: Self-pay | Admitting: Emergency Medicine

## 2016-03-01 DIAGNOSIS — C3491 Malignant neoplasm of unspecified part of right bronchus or lung: Secondary | ICD-10-CM

## 2016-03-01 DIAGNOSIS — I6330 Cerebral infarction due to thrombosis of unspecified cerebral artery: Secondary | ICD-10-CM | POA: Diagnosis not present

## 2016-03-01 DIAGNOSIS — C3401 Malignant neoplasm of right main bronchus: Secondary | ICD-10-CM

## 2016-03-01 DIAGNOSIS — Z510 Encounter for antineoplastic radiation therapy: Secondary | ICD-10-CM | POA: Diagnosis not present

## 2016-03-01 MED ORDER — SONAFINE EX EMUL
1.0000 "application " | Freq: Two times a day (BID) | CUTANEOUS | Status: DC
  Administered 2016-03-01: 1 via TOPICAL

## 2016-03-01 NOTE — Assessment & Plan Note (Signed)
She is tolerating radiation therapy. Her hemoptysis appears to be stopping. She rightly inquired about possible reaccumulation of her right pleural effusion. I recommended that she consider repeat thoracentesis or even possibly a Pleurx catheter should that happen. Unclear to me that she needs to stay on dexamethasone. I have asked her to taper this to off. She will follow-up with me as needed.

## 2016-03-01 NOTE — Progress Notes (Signed)
Subjective:    Patient ID: Deborah [name], female    DOB: Jan 11, 1927, 82 y.o.   MRN: [id number]  HPI  82 y.o. woman, Former smoker (~20 pk-yrs), with a PMH remote right sided breast CA s/p lumpectomy and chemo / radiation in 1994. She has been cancer free since. She also has a history of hypertension, hyperlipidemia, chronic atrial fibrillation and a [employer] mechanical aortic valve replacement on anticoagulation, recently changed to enoxaparin twice a day.  Has seen Dr. Jana Hakim for anemia presumed due to chronic GI blood loss in the setting of AVMs and gets PRN blood transfusions (just had 2 units 01/09/16). On 10/11, she was brought to [facility] ED with SOB.  Had CTA that demonstrated right sided lung mass; therefore, PCCM was asked to see. There was an associated right effusion that was tapped. Cytology has not revealed any evidence of malignancy. She is seen now for further plans regarding her right hilar mass.  I have reviewed her CT scan of the chest from 01/10/16. This shows no evidence pulmonary embolism but does reveal a right hilar mass that surrounds and narrows the right main pulmonary artery, narrows and impacts the anterior segment bronchus of the right upper lobe. The bronchus is narrowed but is not clear as to whether there is an endobronchial lesion. Also noted is an enlarged 4R lymph node adjacent to the hilar mass.  ROV 03/01/16 -- This follow-up visit for newly diagnosed small cell lung cancer. She underwent endobronchial ultrasound bronchoscopy on 02/07/16 that confirmed the diagnosis. He had a right upper lobe and bronchial lesion as well as a right hilar mass that were positive. Course has been complicated by some hemoptysis. She has been undergoing radiation therapy under the direction of Dr Lisbeth Renshaw. She is very tired. Her cough is a bit [name]. No longer having fresh hemoptysis, some small amounts old blood. She is on dexamethasone for unclear reasons, she believes that this was started  to help w dyspnea. Tolerating lovenox bid.    Review of Systems As per HPI  Past Medical History:  Diagnosis Date  . Acute CHF (congestive heart failure) ([place]) 01/10/2016  . Anemia   . Arthritis    "fingers, toes, hips; qwhere" (05/19/2013)  . Breast cancer ([place]) dx'd 1994   "right; S/P lumpectomy, chemo, XRT"   . Chronic lower GI bleeding   . Complication of anesthesia    "once I'm awakened, I don't sleep til the following night" BP drops very low had to stop putting in [place] -a- cath  . GERD (gastroesophageal reflux disease)   . Gout   . Heart murmur   . High cholesterol   . History of blood transfusion 1996; ~ 2013   "related to valve replacement; GI bleeding"   . History of breast cancer 02/16/2016  . History of echocardiogram    Echo 1/17: mild LVH, EF 50-55%, no RWMA, mild AI, mechanical AVR with mean 21 mmHg/peak 42 mmHg (stable since 2012), mod MR, mod to severe BAE, PASP 36 mmHg  . History of nuclear stress test    Myoview 1/17: EF 59%, normal perfusion, low risk  . Hypertension   . Hypothyroidism   . Lung cancer ([place]) dx'd 01/2016   SCL  . On supplemental oxygen therapy   . Permanent atrial fibrillation ([place])       . Pneumonia 07/2011  . Rectus sheath hematoma 12/19/14    held anticoagulation for 1 week, transfused  . Small cell lung cancer, right (  [place]) 02/16/2016  . Stomach problems    seeing Dr Watt Climes  . TIA (transient ischemic attack) 12/24/14   symptoms resolved     Family History  Problem Relation Age of Onset  . Dementia Mother   . Coronary artery disease Father   . Heart attack Father   . Breast cancer Daughter   . Colon cancer Sister   . Liver cancer Sister   . Lung cancer Brother   . COPD Brother      Social History   Social History  . Marital status: Widowed    Spouse name: N/A  . Number of children: N/A  . Years of education: N/A   Occupational History  . Not on file.   Social History Main Topics  . Smoking status: Former Smoker     Packs/day: 1.00    Years: 20.00    Types: Cigarettes    Quit date: 04/01/1978  . Smokeless tobacco: Never Used  . Alcohol use 4.8 oz/week    4 Glasses of wine, 4 Shots of liquor per week     Comment: burbon or glass wine daily  . Drug use: No  . Sexual activity: No   Other Topics Concern  . Not on file   Social History Narrative  . No narrative on file     Allergies  Allergen Reactions  . Alendronate Sodium Other (See Comments)    GI PAIN CAUTION: RISK FOR PERFORATION  . Amiodarone Shortness Of Breath  . Chlorthalidone Shortness Of Breath    ABDOMINAL BLOATING  . Macrodantin [Nitrofurantoin] Nausea And Vomiting and Other (See Comments)    CONTRAINDICATED WITH GFR < 50  . Meclizine Swelling    SWELLING OF TONGUE  . Norvasc [Amlodipine Besylate] Swelling    SWELLING REACTION UNSPECIFIED   . Hydralazine Hcl Nausea Only  . Sulfa Antibiotics Other (See Comments)    Unknown  . Tussionex Pennkinetic Er [Hydrocod Polst-Cpm Polst Er] Other (See Comments)    UNSPECIFIED REACTION      Outpatient Medications Prior to Visit  Medication Sig Dispense Refill  . acetaminophen (TYLENOL) 500 MG tablet Take 1,000 mg by mouth every 6 (six) hours as needed for mild pain, moderate pain or headache. Reported on 09/26/2015    . allopurinol (ZYLOPRIM) 300 MG tablet Take 300 mg by mouth at bedtime.     . calcium-vitamin D 500 MG tablet Take 1 tablet by mouth 2 (two) times daily. Reported on 09/26/2015    . carvedilol (COREG) 6.25 MG tablet TAKE 6.25 MG BY MOUTH TWICE DAILY 60 tablet 1  . celecoxib (CELEBREX) 200 MG capsule Take 200 mg by mouth daily as needed for moderate pain.   4  . dexamethasone (DECADRON) 4 MG tablet Take 2 tablets (8 mg total) by mouth 2 (two) times daily with a meal. 40 tablet 3  . dicyclomine (BENTYL) 10 MG capsule Take 10 mg by mouth daily as needed for spasms.     Marland Kitchen enoxaparin (LOVENOX) 100 MG/ML injection Inject 0.5 mLs (50 mg total) into the skin every 12 (twelve) hours.  Please dispense 2 week supply, zero refills 0 Syringe   . fexofenadine (ALLEGRA ALLERGY) 180 MG tablet Take 180 mg by mouth daily.     . furosemide (LASIX) 40 MG tablet Take 1-2 tablets daily as directed. (Patient taking differently: Take 40 mg by mouth 2 (two) times daily as needed for fluid. ) 45 tablet 6  . hydrOXYzine (ATARAX/VISTARIL) 25 MG tablet Take 25 mg by mouth  3 (three) times daily as needed for anxiety.    Marland Kitchen levothyroxine (SYNTHROID, LEVOTHROID) 88 MCG tablet Take 1 tablet (88 mcg total) by mouth daily before breakfast. 90 tablet 3  . LORazepam (ATIVAN) 0.5 MG tablet Take 1 tablet (0.5 mg total) by mouth every 8 (eight) hours as needed for anxiety. 30 tablet 0  . NITROSTAT 0.4 MG SL tablet Place 1 tablet (0.4 mg total) under the tongue every 5 (five) minutes as needed for chest pain (x 3 doses). Reported on 10/17/2015 25 tablet 5  . OXYGEN Inhale 2 L into the lungs continuous.    . potassium chloride SA (K-DUR,KLOR-CON) 20 MEQ tablet Take 0.5 tablets (10 mEq total) by mouth 2 (two) times daily as needed (taking with Lasix).    . Probiotic Product (PROBIOTIC ADVANCED PO) Take 1 tablet by mouth daily. Reported on 10/17/2015    . simvastatin (ZOCOR) 20 MG tablet Take 10 mg by mouth daily.     . traMADol (ULTRAM) 50 MG tablet Take 1 tablet (50 mg total) by mouth every 6 (six) hours as needed. (Patient taking differently: Take 50 mg by mouth every 6 (six) hours as needed for moderate pain or severe pain. ) 30 tablet 0  . valsartan (DIOVAN) 320 MG tablet Take 1 tablet (320 mg total) by mouth daily. 30 tablet 5  . Wound Dressings (SONAFINE) Apply 1 application topically daily.    Marland Kitchen zolpidem (AMBIEN) 5 MG tablet Take 5 mg by mouth at bedtime.      Facility-Administered Medications Prior to Visit  Medication Dose Route Frequency Provider Last Rate Last Dose  . SONAFINE emulsion 1 application  1 application Topical BID Hayden Pedro, PA-C   1 application at 37/16/96 1247           Objective:   Physical Exam Vitals:   03/01/16 1525  BP: 104/60  Pulse: 79  SpO2: 96%  Weight: 121 lb 9.6 oz (55.2 kg)  Height: '5\' 4"'$  (1.626 m)   Gen: Pleasant, well-nourished, in no distress,  normal affect  ENT: No lesions,  mouth clear,  oropharynx clear, no postnasal drip  Neck: No JVD, no TMG, no carotid bruits  Lungs: No use of accessory muscles, no dullness to percussion, clear without rales or rhonchi  Cardiovascular: RRR, heart sounds normal, no murmur or gallops, no peripheral edema  Musculoskeletal: No deformities, no cyanosis or clubbing  Neuro: alert, non focal  Skin: Warm, no lesions or rashes    CT chest 01/10/16 --  COMPARISON:  Chest x-ray 12/31/2015  FINDINGS: Cardiovascular: No a definitive filling defect within the main pulmonary arteries. No definite filling defect within the central segmental pulmonary arteries on the left side. There is moderate to severe narrowing of the distal right main pulmonary artery by a mass. Severe narrowing of segmental and subsegmental pulmonary arterial branches in the right upper lobe by a soft tissue mass. Poor filling of the terminal branches of right upper lobe pulmonary artery, suspected to be secondary to decreased flow; no discrete focal filling defects are visualized. Pulmonary arterial trunk is enlarged. There is aneurysmal dilatation of the ascending thoracic aorta, measuring up to 4.7 cm in diameter. Ectasia of the aortic arch, arch diameter is 3.2 cm. Proximal descending thoracic aortic diameter 3.2 cm. No intimal flap is seen. No mediastinal hematoma. Extensive calcifications are present within the aorta and great vessels. Aortic arch is left-sided. There is aberrant course of the right subclavian artery from the distal arch, it demonstrates retroesophageal  course. There is a central venous catheter present within a persistent left-sided SVC. There is moderate cardiomegaly. No large pericardial  effusion.  Mediastinum/Nodes: Multiple enlarged, possibly necrotic lymph nodes within the mediastinum. A right pretracheal lymph node measures 2.7 by 2 cm. Inferior to this is an additional enlarged lymph node measuring 2.7 by 2.5 cm. Oval hypodense lesion to the left of the pulmonary artery trunk may reflect additional enlarged lymph node, this measures 2.6 by 2 cm.  Lungs/Pleura: There are bilateral pleural effusions, small on the left and small to moderate on the right. Emphysematous disease is present bilaterally, most notable in the upper lobes. There is a large right hilar soft tissue mass, this measures 4.2 cm AP by 3.6 cm transverse by 5.2 cm cranial caudad. There is suspected invasion of the mediastinum by the mass or possible contiguity of the mass with mediastinal adenopathy. The mass encases the right-sided pulmonary artery and segmental branches of the right upper lobe.  There is plaque-like soft tissue density in the apical portion of the right upper lobe with pleural thickening suspected to represent scarring. There are similar findings in the apical portion of the left upper lobe. Hazy attenuation present within the anterior portion of right upper lobe, possible pneumonitis or post radiation change. Adjacent subpleural fibrosis. Stellate soft tissue focus subpleural right middle lobe, series 11, image number 71, possible scar. Multiple tiny nodular densities are present within the anterior aspect of the left upper lobe. Small 5 mm possible developing nodule in the left lower lobe.  Upper Abdomen: Borderline enlarged spleen.  No acute abnormalities.  Musculoskeletal: No acute osseous abnormality. No highly suspicious bone lesions. Surgical clips in the right axilla.  Review of the MIP images confirms the above findings.  IMPRESSION: 1. No definite CT evidence for acute pulmonary embolus, however right upper lobe pulmonary arterial evaluation is slightly  limited by the presence of an encasing lung mass. Enlarged pulmonary arterial trunk, correlate clinically for pulmonary hypertension. 2. Large right hilar soft tissue mass with suspected invasion of the mediastinum. The mass encases and narrows the distal right main pulmonary artery and branch vessels of the right upper lobe. Multiple suspicious enlarged mediastinal lymph nodes are also present. Findings could be secondary to metastatic disease or primary lung carcinoma. 3. Small right greater than left bilateral pleural effusions. Bilateral emphysematous disease. Additional scattered nodular densities in the anterior left upper lobe could relate to infectious or inflammatory process with metastatic disease not excluded. 4. Marked cardiomegaly. Dense atherosclerotic vascular disease of the aorta. 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 5. Aneurysmal dilatation of the ascending aorta. Ascending thoracic aortic aneurysm. Recommend semi-annual imaging followup by CTA or MRA and referral to cardiothoracic surgery if not already obtained.      Assessment & Plan:  Small cell lung cancer, right [facility]) She is tolerating radiation therapy. Her hemoptysis appears to be stopping. She rightly inquired about possible reaccumulation of her right pleural effusion. I recommended that she consider repeat thoracentesis or even possibly a Pleurx catheter should that happen. Unclear to me that she needs to stay on dexamethasone. I have asked her to taper this to off. She will follow-up with me as needed.   Baltazar Apo, MD, PhD 03/01/2016, 4:12 PM [facility] [phone] or if no answer [phone]

## 2016-03-01 NOTE — Patient Instructions (Signed)
Please complete radiation therapy as planned. Depending on your repeat Ct scan results you may want to discuss drainage of right pleural fluid or even an indwelling right pleural catheter to optimize your breathing.

## 2016-03-01 NOTE — Progress Notes (Addendum)
Patient education done, radiation therapy and you book, my business card, sonafine cream, discussed ways to manage side effects, skin irritation,pain, throat changes, fatigue, , may need to eat smaller meals,softer foods, if having difficulty swallowing, food feels like getting stuck, may need rx carafate,  Drink plenty water, increase calories and protein in diet, increased coughing, shortness breath, inform MD, stay away from spicy greasy foods,  Verbal understanding. Teach back , apply sonafine daily after rad tx 12:57 PM

## 2016-03-04 ENCOUNTER — Ambulatory Visit
Admission: RE | Admit: 2016-03-04 | Discharge: 2016-03-04 | Disposition: A | Discharge status: Home / Self Care | Payer: Medicare Other | Source: Ambulatory Visit | Attending: Radiation Oncology | Admitting: Radiation Oncology

## 2016-03-04 ENCOUNTER — Other Ambulatory Visit (HOSPITAL_BASED_OUTPATIENT_CLINIC_OR_DEPARTMENT_OTHER): Payer: Medicare Other

## 2016-03-04 ENCOUNTER — Other Ambulatory Visit: Payer: Self-pay | Admitting: Emergency Medicine

## 2016-03-04 ENCOUNTER — Encounter: Payer: Self-pay | Admitting: Radiation Oncology

## 2016-03-04 VITALS — BP 116/61 | HR 79 | Temp 97.8°F | Resp 20 | Wt 120.6 lb

## 2016-03-04 DIAGNOSIS — I4891 Unspecified atrial fibrillation: Secondary | ICD-10-CM | POA: Diagnosis not present

## 2016-03-04 DIAGNOSIS — K922 Gastrointestinal hemorrhage, unspecified: Secondary | ICD-10-CM

## 2016-03-04 DIAGNOSIS — D689 Coagulation defect, unspecified: Secondary | ICD-10-CM

## 2016-03-04 DIAGNOSIS — D500 Iron deficiency anemia secondary to blood loss (chronic): Secondary | ICD-10-CM

## 2016-03-04 DIAGNOSIS — K921 Melena: Secondary | ICD-10-CM

## 2016-03-04 DIAGNOSIS — C3490 Malignant neoplasm of unspecified part of unspecified bronchus or lung: Secondary | ICD-10-CM

## 2016-03-04 DIAGNOSIS — C3401 Malignant neoplasm of right main bronchus: Secondary | ICD-10-CM | POA: Diagnosis present

## 2016-03-04 DIAGNOSIS — I4819 Other persistent atrial fibrillation: Secondary | ICD-10-CM

## 2016-03-04 DIAGNOSIS — I119 Hypertensive heart disease without heart failure: Secondary | ICD-10-CM

## 2016-03-04 DIAGNOSIS — Z7901 Long term (current) use of anticoagulants: Secondary | ICD-10-CM

## 2016-03-04 DIAGNOSIS — D62 Acute posthemorrhagic anemia: Secondary | ICD-10-CM

## 2016-03-04 DIAGNOSIS — Z510 Encounter for antineoplastic radiation therapy: Secondary | ICD-10-CM | POA: Diagnosis not present

## 2016-03-04 LAB — CBC WITH DIFFERENTIAL/PLATELET
BASO%: 0 % (ref 0.0–2.0)
Basophils Absolute: 0 10*3/uL (ref 0.0–0.1)
EOS%: 0.9 % (ref 0.0–7.0)
Eosinophils Absolute: 0.1 10*3/uL (ref 0.0–0.5)
HCT: 31.6 % — ABNORMAL LOW (ref 34.8–46.6)
HEMOGLOBIN: 10.1 g/dL — AB (ref 11.6–15.9)
LYMPH#: 0.4 10*3/uL — AB (ref 0.9–3.3)
LYMPH%: 7.1 % — ABNORMAL LOW (ref 14.0–49.7)
MCH: 32.9 pg (ref 25.1–34.0)
MCHC: 32 g/dL (ref 31.5–36.0)
MCV: 102.9 fL — ABNORMAL HIGH (ref 79.5–101.0)
MONO#: 0.2 10*3/uL (ref 0.1–0.9)
MONO%: 4.1 % (ref 0.0–14.0)
NEUT%: 87.9 % — ABNORMAL HIGH (ref 38.4–76.8)
NEUTROS ABS: 4.7 10*3/uL (ref 1.5–6.5)
NRBC: 0 % (ref 0–0)
Platelets: 121 10*3/uL — ABNORMAL LOW (ref 145–400)
RBC: 3.07 10*6/uL — AB (ref 3.70–5.45)
RDW: 16.2 % — AB (ref 11.2–14.5)
WBC: 5.4 10*3/uL (ref 3.9–10.3)

## 2016-03-04 LAB — PROTIME-INR
INR: 1 — AB (ref 2.00–3.50)
PROTIME: 12 s (ref 10.6–13.4)

## 2016-03-04 NOTE — Progress Notes (Signed)
Weekly rad txs lung   9/10  Completed. on 2 liters n/c oxygen, no difficulty swallowing, appetite okay, no nausea, just no energy stated patient, in w/c,  3:34 PM BP 116/61 (BP Location: Left Arm, Patient Position: Sitting, Cuff Size: Normal)   Pulse 79   Temp 97.8 F (36.6 C) (Oral)   Resp 20   Wt 120 lb 9.6 oz (54.7 kg)   SpO2 99% Comment: room air  BMI 20.70 kg/m   Wt Readings from Last 3 Encounters:  03/04/16 120 lb 9.6 oz (54.7 kg)  03/01/16 121 lb 9.6 oz (55.2 kg)  02/27/16 119 lb 6.4 oz (54.2 kg)

## 2016-03-04 NOTE — Progress Notes (Signed)
Department of Radiation Oncology  Phone:  [phone] Fax:        [phone]  Weekly Treatment Note    Name: Deborah [name] Date: 03/04/2016 MRN: [id number] DOB: 1926/07/02   Diagnosis:     ICD-9-CM ICD-10-CM   1. Malignant neoplasm of hilus of right lung ([place]) 162.2 C34.01      Current dose: 27 Gy  Current fraction:9   MEDICATIONS: Current Outpatient Prescriptions  Medication Sig Dispense Refill  . acetaminophen (TYLENOL) 500 MG tablet Take 1,000 mg by mouth every 6 (six) hours as needed for mild pain, moderate pain or headache. Reported on 09/26/2015    . allopurinol (ZYLOPRIM) 300 MG tablet Take 300 mg by mouth at bedtime.     . calcium-vitamin D 500 MG tablet Take 1 tablet by mouth 2 (two) times daily. Reported on 09/26/2015    . carvedilol (COREG) 6.25 MG tablet TAKE 6.25 MG BY MOUTH TWICE DAILY 60 tablet 1  . celecoxib (CELEBREX) 200 MG capsule Take 200 mg by mouth daily as needed for moderate pain.   4  . dexamethasone (DECADRON) 4 MG tablet Take 2 tablets (8 mg total) by mouth 2 (two) times daily with a meal. 40 tablet 3  . dicyclomine (BENTYL) 10 MG capsule Take 10 mg by mouth daily as needed for spasms.     Marland Kitchen enoxaparin (LOVENOX) 100 MG/ML injection Inject 0.5 mLs (50 mg total) into the skin every 12 (twelve) hours. Please dispense 2 week supply, zero refills 0 Syringe   . fexofenadine (ALLEGRA ALLERGY) 180 MG tablet Take 180 mg by mouth daily.     . furosemide (LASIX) 40 MG tablet Take 1-2 tablets daily as directed. (Patient taking differently: Take 40 mg by mouth 2 (two) times daily as needed for fluid. ) 45 tablet 6  . hydrOXYzine (ATARAX/VISTARIL) 25 MG tablet Take 25 mg by mouth 3 (three) times daily as needed for anxiety.    Marland Kitchen levothyroxine (SYNTHROID, LEVOTHROID) 88 MCG tablet Take 1 tablet (88 mcg total) by mouth daily before breakfast. 90 tablet 3  . LORazepam (ATIVAN) 0.5 MG tablet Take 1 tablet (0.5 mg total) by mouth every 8 (eight) hours as needed  for anxiety. 30 tablet 0  . NITROSTAT 0.4 MG SL tablet Place 1 tablet (0.4 mg total) under the tongue every 5 (five) minutes as needed for chest pain (x 3 doses). Reported on 10/17/2015 25 tablet 5  . OXYGEN Inhale 2 L into the lungs continuous.    . potassium chloride SA (K-DUR,KLOR-CON) 20 MEQ tablet Take 0.5 tablets (10 mEq total) by mouth 2 (two) times daily as needed (taking with Lasix).    . Probiotic Product (PROBIOTIC ADVANCED PO) Take 1 tablet by mouth daily. Reported on 10/17/2015    . simvastatin (ZOCOR) 20 MG tablet Take 10 mg by mouth daily.     . traMADol (ULTRAM) 50 MG tablet Take 1 tablet (50 mg total) by mouth every 6 (six) hours as needed. (Patient taking differently: Take 50 mg by mouth every 6 (six) hours as needed for moderate pain or severe pain. ) 30 tablet 0  . valsartan (DIOVAN) 320 MG tablet Take 1 tablet (320 mg total) by mouth daily. 30 tablet 5  . Wound Dressings (SONAFINE) Apply 1 application topically daily.    Marland Kitchen zolpidem (AMBIEN) 5 MG tablet Take 5 mg by mouth at bedtime.      No current facility-administered medications for this encounter.      ALLERGIES: Alendronate sodium;  Amiodarone; Chlorthalidone; Macrodantin [nitrofurantoin]; Meclizine; Norvasc [amlodipine besylate]; Hydralazine hcl; Sulfa antibiotics; and Tussionex pennkinetic er [hydrocod polst-cpm polst er]   LABORATORY DATA:  Lab Results  Component Value Date   WBC 5.4 03/04/2016   HGB 10.1 (L) 03/04/2016   HCT 31.6 (L) 03/04/2016   MCV 102.9 (H) 03/04/2016   PLT 121 (L) 03/04/2016   Lab Results  Component Value Date   NA 133 (L) 02/20/2016   K 4.1 02/20/2016   CL 94 (L) 02/20/2016   CO2 34 (H) 02/20/2016   Lab Results  Component Value Date   ALT 26 02/16/2016   AST 20 02/16/2016   ALKPHOS 55 02/16/2016   BILITOT 0.66 02/16/2016     NARRATIVE: Deborah [name] was seen today for weekly treatment management. The chart was checked and the patient's films were reviewed.  The patient  has completed 9 of 10 treatments to the lung. She denies difficulty swallowing, or nausea. She reports a fair appetite, and low energy.  PHYSICAL EXAMINATION: weight is 120 lb 9.6 oz (54.7 kg). Her oral temperature is 97.8 F (36.6 C). Her blood pressure is 116/61 and her pulse is 79. Her respiration is 20 and oxygen saturation is 99%.      Patient uses a nasal cannula of 2 liters  ASSESSMENT: The patient is doing satisfactorily with treatment.  PLAN: We will continue with the patient's radiation treatment as planned.        This document serves as a record of services personally performed by Kyung Rudd, MD. It was created on his behalf by Bethann Humble, a trained medical scribe. The creation of this record is based on the scribe's personal observations and the provider's statements to them. This document has been checked and approved by the attending provider.

## 2016-03-05 ENCOUNTER — Ambulatory Visit
Admission: RE | Admit: 2016-03-05 | Discharge: 2016-03-05 | Disposition: A | Discharge status: Home / Self Care | Payer: Medicare Other | Source: Ambulatory Visit | Attending: Radiation Oncology | Admitting: Radiation Oncology

## 2016-03-05 ENCOUNTER — Encounter: Payer: Self-pay | Admitting: Radiation Oncology

## 2016-03-05 DIAGNOSIS — Z510 Encounter for antineoplastic radiation therapy: Secondary | ICD-10-CM | POA: Diagnosis not present

## 2016-03-05 LAB — FERRITIN: FERRITIN: 118 ng/mL (ref 9–269)

## 2016-03-07 ENCOUNTER — Other Ambulatory Visit (HOSPITAL_BASED_OUTPATIENT_CLINIC_OR_DEPARTMENT_OTHER): Payer: Medicare Other

## 2016-03-07 ENCOUNTER — Ambulatory Visit (HOSPITAL_COMMUNITY)
Admission: RE | Admit: 2016-03-07 | Discharge: 2016-03-07 | Disposition: A | Discharge status: Home / Self Care | Payer: Medicare Other | Source: Ambulatory Visit | Attending: Oncology | Admitting: Oncology

## 2016-03-07 ENCOUNTER — Other Ambulatory Visit: Payer: Self-pay | Admitting: Oncology

## 2016-03-07 ENCOUNTER — Other Ambulatory Visit: Payer: Self-pay | Admitting: *Deleted

## 2016-03-07 DIAGNOSIS — C3491 Malignant neoplasm of unspecified part of right bronchus or lung: Secondary | ICD-10-CM | POA: Diagnosis present

## 2016-03-07 DIAGNOSIS — J90 Pleural effusion, not elsewhere classified: Secondary | ICD-10-CM | POA: Insufficient documentation

## 2016-03-07 DIAGNOSIS — C3401 Malignant neoplasm of right main bronchus: Secondary | ICD-10-CM | POA: Diagnosis present

## 2016-03-07 DIAGNOSIS — D649 Anemia, unspecified: Secondary | ICD-10-CM

## 2016-03-07 LAB — CBC WITH DIFFERENTIAL/PLATELET
BASO%: 0.4 % (ref 0.0–2.0)
BASOS ABS: 0 10*3/uL (ref 0.0–0.1)
EOS ABS: 0 10*3/uL (ref 0.0–0.5)
EOS%: 1 % (ref 0.0–7.0)
HEMATOCRIT: 29 % — AB (ref 34.8–46.6)
HGB: 9.3 g/dL — ABNORMAL LOW (ref 11.6–15.9)
LYMPH#: 0.3 10*3/uL — AB (ref 0.9–3.3)
LYMPH%: 8.4 % — ABNORMAL LOW (ref 14.0–49.7)
MCH: 32.6 pg (ref 25.1–34.0)
MCHC: 32.2 g/dL (ref 31.5–36.0)
MCV: 101.3 fL — ABNORMAL HIGH (ref 79.5–101.0)
MONO#: 0.3 10*3/uL (ref 0.1–0.9)
MONO%: 7.5 % (ref 0.0–14.0)
NEUT#: 3.2 10*3/uL (ref 1.5–6.5)
NEUT%: 82.7 % — AB (ref 38.4–76.8)
PLATELETS: 105 10*3/uL — AB (ref 145–400)
RBC: 2.86 10*6/uL — ABNORMAL LOW (ref 3.70–5.45)
RDW: 17.3 % — ABNORMAL HIGH (ref 11.2–14.5)
WBC: 3.8 10*3/uL — ABNORMAL LOW (ref 3.9–10.3)

## 2016-03-07 LAB — PREPARE RBC (CROSSMATCH)

## 2016-03-07 NOTE — Progress Notes (Unsigned)
Barbette's Hb is down to 9.1 and she will need a Tx-- we have set that up for Sat  12/9  She will see dr Julien Nordmann 12/18 and likely will switch to a comfort care/Hospice approach at that time  She knows to call for any problems that may develop be=fore the next visit

## 2016-03-07 NOTE — Progress Notes (Signed)
°  Radiation Oncology         [phone] ________________________________  Name: Deborah [name] MRN: [id number]  Date: 03/05/2016  DOB: [birth year]  End of Treatment Note  Diagnosis:   Extensive stage (T2b, N2, M1b) small cell lung cancer presented with large right hilar mass in addition to mediastinal lymphadenopathy and metastatic left adrenal gland nodule   Indication for treatment:  Palliative   Radiation treatment dates:   02/19/16 - 03/05/16  Site/dose:   Right Lung: 30 Gy in 10 fractions  Beams/energy:   3D // [employer]  Narrative: The patient tolerated radiation treatment relatively well. The patient denied difficulty swallowing or nausea. She reported a fair appetite and a low energy level.  Plan: The patient has completed radiation treatment. The patient will return to radiation oncology clinic for routine followup in one month. I advised them to call or return sooner if they have any questions or concerns related to their recovery or treatment.  ------------------------------------------------  Jodelle Gross, MD, PhD  This document serves as a record of services personally performed by Kyung Rudd, MD. It was created on his behalf by Darcus Austin, a trained medical scribe. The creation of this record is based on the scribe's personal observations and the provider's statements to them. This document has been checked and approved by the attending provider.

## 2016-03-08 ENCOUNTER — Ambulatory Visit (HOSPITAL_BASED_OUTPATIENT_CLINIC_OR_DEPARTMENT_OTHER): Payer: Medicare Other

## 2016-03-08 DIAGNOSIS — D500 Iron deficiency anemia secondary to blood loss (chronic): Secondary | ICD-10-CM | POA: Diagnosis present

## 2016-03-08 DIAGNOSIS — K922 Gastrointestinal hemorrhage, unspecified: Secondary | ICD-10-CM

## 2016-03-08 DIAGNOSIS — D649 Anemia, unspecified: Secondary | ICD-10-CM | POA: Diagnosis not present

## 2016-03-08 MED ORDER — ACETAMINOPHEN 325 MG PO TABS
ORAL_TABLET | ORAL | Status: AC
  Filled 2016-03-08: qty 2

## 2016-03-08 MED ORDER — ACETAMINOPHEN 325 MG PO TABS
650.0000 mg | ORAL_TABLET | Freq: Once | ORAL | Status: AC
  Administered 2016-03-08: 650 mg via ORAL

## 2016-03-08 MED ORDER — DIPHENHYDRAMINE HCL 25 MG PO CAPS
ORAL_CAPSULE | ORAL | Status: AC
  Filled 2016-03-08: qty 1

## 2016-03-08 MED ORDER — HEPARIN SOD (PORK) LOCK FLUSH 100 UNIT/ML IV SOLN
500.0000 [IU] | Freq: Every day | INTRAVENOUS | Status: AC | PRN
  Administered 2016-03-08: 500 [IU]
  Filled 2016-03-08: qty 5

## 2016-03-08 MED ORDER — HEPARIN SOD (PORK) LOCK FLUSH 100 UNIT/ML IV SOLN
250.0000 [IU] | INTRAVENOUS | Status: DC | PRN
Start: 2016-03-08 — End: 2016-03-08
  Filled 2016-03-08: qty 5

## 2016-03-08 MED ORDER — SODIUM CHLORIDE 0.9 % IV SOLN
250.0000 mL | Freq: Once | INTRAVENOUS | Status: AC
  Administered 2016-03-08: 250 mL via INTRAVENOUS

## 2016-03-08 MED ORDER — DIPHENHYDRAMINE HCL 25 MG PO CAPS
25.0000 mg | ORAL_CAPSULE | Freq: Once | ORAL | Status: AC
  Administered 2016-03-08: 25 mg via ORAL

## 2016-03-08 MED ORDER — SODIUM CHLORIDE 0.9% FLUSH
10.0000 mL | INTRAVENOUS | Status: AC | PRN
  Administered 2016-03-08: 10 mL
  Filled 2016-03-08: qty 10

## 2016-03-08 NOTE — Patient Instructions (Signed)

## 2016-03-11 ENCOUNTER — Ambulatory Visit (INDEPENDENT_AMBULATORY_CARE_PROVIDER_SITE_OTHER): Payer: Medicare Other | Admitting: Cardiology

## 2016-03-11 ENCOUNTER — Other Ambulatory Visit: Payer: Medicare Other

## 2016-03-11 ENCOUNTER — Other Ambulatory Visit: Payer: Self-pay | Admitting: Emergency Medicine

## 2016-03-11 ENCOUNTER — Other Ambulatory Visit: Payer: Self-pay | Admitting: Oncology

## 2016-03-11 ENCOUNTER — Encounter: Payer: Self-pay | Admitting: Cardiology

## 2016-03-11 VITALS — BP 94/60 | HR 95 | Ht 60.0 in | Wt 132.6 lb

## 2016-03-11 DIAGNOSIS — I712 Thoracic aortic aneurysm, without rupture: Secondary | ICD-10-CM

## 2016-03-11 DIAGNOSIS — Z954 Presence of other heart-valve replacement: Secondary | ICD-10-CM | POA: Diagnosis not present

## 2016-03-11 DIAGNOSIS — R1031 Right lower quadrant pain: Secondary | ICD-10-CM

## 2016-03-11 DIAGNOSIS — I10 Essential (primary) hypertension: Secondary | ICD-10-CM

## 2016-03-11 DIAGNOSIS — C3491 Malignant neoplasm of unspecified part of right bronchus or lung: Secondary | ICD-10-CM

## 2016-03-11 DIAGNOSIS — I6330 Cerebral infarction due to thrombosis of unspecified cerebral artery: Secondary | ICD-10-CM

## 2016-03-11 DIAGNOSIS — I482 Chronic atrial fibrillation, unspecified: Secondary | ICD-10-CM

## 2016-03-11 DIAGNOSIS — R000 Tachycardia, unspecified: Secondary | ICD-10-CM | POA: Diagnosis not present

## 2016-03-11 DIAGNOSIS — I7121 Aneurysm of the ascending aorta, without rupture: Secondary | ICD-10-CM

## 2016-03-11 LAB — TYPE AND SCREEN
ABO/RH(D): O POS
Antibody Screen: NEGATIVE
Unit division: 0

## 2016-03-11 MED ORDER — CARVEDILOL 12.5 MG PO TABS: 12.5000 mg | ORAL_TABLET | Freq: Two times a day (BID) | ORAL | 6 refills | Status: DC

## 2016-03-11 MED ORDER — VALSARTAN 160 MG PO TABS: 320.0000 mg | ORAL_TABLET | Freq: Every day | ORAL | 6 refills | Status: DC

## 2016-03-11 NOTE — Progress Notes (Signed)
Cardiology Office Note   Date:  03/11/2016   ID:  Deborah [name], DOB 12/23/1926, MRN [id number]  PCP:  Mathews Argyle, MD  Cardiologist:  Dr. Oval Linsey    Chief Complaint  Patient presents with  . Shortness of Breath    all the time   . Edema    left ankle      History of Present Illness: Deborah [name] is a 82 y.o. female who presents for tachycardia.  Her HH RN found her HR to be 145 this AM.    She has a recent hx of small cell lung cancer undergoing XRT, hypertension, hyperlipidemia, ascending aortic aneurysm, permanent atrial fibrillation, stroke, hypothyroidism, prior mechanical aortic valve replacement, and NSVT who presents for follow-up.  Deborah [name] was previously a patient of Dr. Mare Ferrari.  Deborah [name] previously developed a retroperitoneal hematoma on warfarin in September 2006 and was temporarily switched to Lovenox. However she was transitioned back to warfarin. Deborah [name] underwent upper endoscopy on 07/25/15 that showed a small hiatal hernia.  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  Her anticoagulation is managed by her oncologist, Dr. Doreen Salvage.  Her INR goal has been 2-2.5. She continues following up with her oncologist for management of her anemia and requires frequent infusion of IV iron and transfusions. She had a Lexiscan Cardiolite 04/2015 that was negative for ischemia. Her echo in 04/2015 showed mild to moderate aortic stenosis across her mechanical aortic valve (mean gradient 21 mmHg).  She had a subsequent echo 12/2015 due to recurrent shortness of breath.  The mean gradient was 18 mmHg and it was otherwise unchanged.    She noted increased swelling and shortness of breath and was seen by Ellen Henri on 11/23/15.  At that time she had a BNP that was 331 and potassium was 3.3.  She was started on lasix and HCTZ was discontinued. This improved her edema but she developed worsened  episodes of hypertension. She was started on clonidine which helped her blood pressure.  She had a CT scan looking for PE that revealed a large right hilar soft tissue mass with invasion into the mediastinum. The mass encased and narrowed the distal right main pulmonary artery and the branch vessels to the right upper lobe.  She was ultimately diagnosed with small cell lung cancer and is half way through XRT.   She was also noted to have an ascending aortic aneurysm measuring 4.7 cm in diameter.  She was seen in clinic 02/16/16 with chest pain that was thought unlikely to be ischemia.  She notes that her breathing has improved since starting radiation.  She did have an episode of hemoptysis that required admission 11/18-11/23.  Her anticoagulation was switched to heparin and then back to Lovenox prior to discharge.  She denies recurrent bleeding.  She also underwent throacentesis 11/19 with removal of 727m.   Since discharge Ms. GRexrodenotes that her BP has been low.  She would have dizziness and her clonidine and amlodipine were stopped.  She only takes the valsartan for BP > 1160systolic in AM.    She has developed bradycardia with higher doses of beta blocker. But now with increased HR will try to increase again.  She has taken her lasix recently for edema.  And her BP is low today. No chest pain, she has completed her radiation treatment and will see Dr. MEarlie Servernext week.  She would like to go back  on coumadin and stop lovenox..  Today she is seen for tachycardia.  This may be related to hypotension but here in office HR is stable in the 90s.  Earlier today was up to 144.  No chest pain and her chronic SOB is present.  She does complain of soreness Rt lower ext. that is very tender from lovenox injections. .      Past Medical History:  Diagnosis Date  . Acute CHF (congestive heart failure) ([place]) 01/10/2016  . Anemia   . Arthritis    "fingers, toes, hips; qwhere" (05/19/2013)  . Breast  cancer ([place]) dx'd 1994   "right; S/P lumpectomy, chemo, XRT"   . Chronic lower GI bleeding   . Complication of anesthesia    "once I'm awakened, I don't sleep til the following night" BP drops very low had to stop putting in [place] -a- cath  . GERD (gastroesophageal reflux disease)   . Gout   . Heart murmur   . High cholesterol   . History of blood transfusion 1996; ~ 2013   "related to valve replacement; GI bleeding"   . History of breast cancer 02/16/2016  . History of echocardiogram    Echo 1/17: mild LVH, EF 50-55%, no RWMA, mild AI, mechanical AVR with mean 21 mmHg/peak 42 mmHg (stable since 2012), mod MR, mod to severe BAE, PASP 36 mmHg  . History of nuclear stress test    Myoview 1/17: EF 59%, normal perfusion, low risk  . Hypertension   . Hypothyroidism   . Lung cancer ([place]) dx'd 01/2016   SCL  . On supplemental oxygen therapy   . Permanent atrial fibrillation ([place])       . Pneumonia 07/2011  . Rectus sheath hematoma 12/19/14    held anticoagulation for 1 week, transfused  . Small cell lung cancer, right ([place]) 02/16/2016  . Stomach problems    seeing Dr Watt Climes  . TIA (transient ischemic attack) 12/24/14   symptoms resolved    Past Surgical History:  Procedure Laterality Date  . ABDOMINAL HYSTERECTOMY    . AORTIC VALVE REPLACEMENT  1996   [employer]  . APPENDECTOMY    . arteriogram  01/12   NO RAS   . BREAST BIOPSY Right   . BREAST LUMPECTOMY Right   . CARDIAC CATHETERIZATION    . CARDIOVERSION     X 2  . CATARACT EXTRACTION W/ INTRAOCULAR LENS  IMPLANT, BILATERAL Bilateral    bilateral cataract with lens implants  . CHOLECYSTECTOMY    . COLONOSCOPY WITH PROPOFOL N/A 11/24/2012   Procedure: COLONOSCOPY WITH PROPOFOL;  Surgeon: Jeryl Columbia, MD;  Location: [facility] ENDOSCOPY;  Service: Endoscopy;  Laterality: N/A;  . ESOPHAGOGASTRODUODENOSCOPY (EGD) WITH PROPOFOL N/A 07/25/2015   Procedure: ESOPHAGOGASTRODUODENOSCOPY (EGD) WITH PROPOFOL;  Surgeon: Clarene Essex, MD;  Location:  [facility] ENDOSCOPY;  Service: Endoscopy;  Laterality: N/A;  . HOT HEMOSTASIS N/A 07/25/2015   Procedure: HOT HEMOSTASIS (ARGON PLASMA COAGULATION/BICAP);  Surgeon: Clarene Essex, MD;  Location[facility] ENDOSCOPY;  Service: Endoscopy;  Laterality: N/A;  . VIDEO BRONCHOSCOPY WITH ENDOBRONCHIAL ULTRASOUND N/A 02/07/2016   Procedure: VIDEO BRONCHOSCOPY WITH ENDOBRONCHIAL ULTRASOUND;  Surgeon: Collene Gobble, MD;  Location: [facility] OR;  Service: Thoracic;  Laterality: N/A;     Current Outpatient Prescriptions  Medication Sig Dispense Refill  . acetaminophen (TYLENOL) 500 MG tablet Take 1,000 mg by mouth every 6 (six) hours as needed for mild pain, moderate pain or headache. Reported on 09/26/2015    . allopurinol (ZYLOPRIM) 300  MG tablet Take 300 mg by mouth at bedtime.     . calcium-vitamin D 500 MG tablet Take 1 tablet by mouth 2 (two) times daily. Reported on 09/26/2015    . carvedilol (COREG) 6.25 MG tablet TAKE 6.25 MG BY MOUTH TWICE DAILY 60 tablet 1  . celecoxib (CELEBREX) 200 MG capsule Take 200 mg by mouth daily as needed for moderate pain.   4  . dexamethasone (DECADRON) 4 MG tablet Take 2 tablets (8 mg total) by mouth 2 (two) times daily with a meal. 40 tablet 3  . dicyclomine (BENTYL) 10 MG capsule Take 10 mg by mouth daily as needed for spasms.     Marland Kitchen enoxaparin (LOVENOX) 100 MG/ML injection Inject 0.5 mLs (50 mg total) into the skin every 12 (twelve) hours. Please dispense 2 week supply, zero refills 0 Syringe   . fexofenadine (ALLEGRA ALLERGY) 180 MG tablet Take 180 mg by mouth daily.     . furosemide (LASIX) 40 MG tablet Take 1-2 tablets daily as directed. (Patient taking differently: Take 40 mg by mouth 2 (two) times daily as needed for fluid. ) 45 tablet 6  . hydrOXYzine (ATARAX/VISTARIL) 25 MG tablet Take 25 mg by mouth 3 (three) times daily as needed for anxiety.    Marland Kitchen levothyroxine (SYNTHROID, LEVOTHROID) 88 MCG tablet Take 1 tablet (88 mcg total) by mouth daily before breakfast. 90 tablet 3  .  LORazepam (ATIVAN) 0.5 MG tablet Take 1 tablet (0.5 mg total) by mouth every 8 (eight) hours as needed for anxiety. 30 tablet 0  . NITROSTAT 0.4 MG SL tablet Place 1 tablet (0.4 mg total) under the tongue every 5 (five) minutes as needed for chest pain (x 3 doses). Reported on 10/17/2015 25 tablet 5  . OXYGEN Inhale 2 L into the lungs continuous.    . potassium chloride SA (K-DUR,KLOR-CON) 20 MEQ tablet Take 0.5 tablets (10 mEq total) by mouth 2 (two) times daily as needed (taking with Lasix).    . Probiotic Product (PROBIOTIC ADVANCED PO) Take 1 tablet by mouth daily. Reported on 10/17/2015    . simvastatin (ZOCOR) 20 MG tablet Take 10 mg by mouth daily.     . traMADol (ULTRAM) 50 MG tablet Take 1 tablet (50 mg total) by mouth every 6 (six) hours as needed. (Patient taking differently: Take 50 mg by mouth every 6 (six) hours as needed for moderate pain or severe pain. ) 30 tablet 0  . valsartan (DIOVAN) 320 MG tablet Take 1 tablet (320 mg total) by mouth daily. 30 tablet 5  . Wound Dressings (SONAFINE) Apply 1 application topically daily.    Marland Kitchen zolpidem (AMBIEN) 5 MG tablet Take 5 mg by mouth at bedtime.      No current facility-administered medications for this visit.     Allergies:   Alendronate sodium; Amiodarone; Chlorthalidone; Macrodantin [nitrofurantoin]; Meclizine; Norvasc [amlodipine besylate]; Hydralazine hcl; Sulfa antibiotics; and Tussionex pennkinetic er [hydrocod polst-cpm polst er]    Social History:  The patient  reports that she quit smoking about 37 years ago. Her smoking use included Cigarettes. She has a 20.00 pack-year smoking history. She has never used smokeless tobacco. She reports that she drinks about 4.8 oz of alcohol per week . She reports that she does not use drugs.   Family History:  The patient's family history includes Breast cancer in her daughter; COPD in her brother; Colon cancer in her sister; Coronary artery disease in her father; Dementia in her mother; Heart  attack  in her father; Liver cancer in her sister; Lung cancer in her brother.    ROS:  General:no colds or fevers, no weight changes Skin:no rashes or ulcers HEENT:no blurred vision, no congestion CV:see HPI PUL:see HPI GI:no diarrhea constipation or melena, no indigestion GU:no hematuria, no dysuria MS:no joint pain, no claudication Neuro:no syncope, no lightheadedness Endo:no diabetes, + thyroid disease  Wt Readings from Last 3 Encounters:  03/11/16 132 lb 9.6 oz (60.1 kg)  03/04/16 120 lb 9.6 oz (54.7 kg)  03/01/16 121 lb 9.6 oz (55.2 kg)     PHYSICAL EXAM: VS:  BP 94/60   Pulse 95   Ht 5' (1.524 m)   Wt 132 lb 9.6 oz (60.1 kg)   BMI 25.90 kg/m  , BMI Body mass index is 25.9 kg/m. General:Pleasant affect, NAD Skin:Warm and dry, brisk capillary refill HEENT:normocephalic, sclera clear, mucus membranes moist Neck:supple, no JVD, no bruits  Heart:irreg irreg without murmur, gallup, rub or click Lungs:clear without rales, rhonchi, or wheezes EXN:TZGY, + tenderness Rt lower quad by bruise, I do not palpate hematoma, pt sitting upright, + BS, do not palpate liver spleen or masses Ext:+ lower ext edema at Lt ankle only,  2+ pedal pulses, 2+ radial pulses Neuro:alert and oriented X 3, MAE, follows commands, + facial symmetry    EKG:  EKG is ordered today. The ekg ordered today demonstrates atrial fib with rate of 95. No other changes in EKG poor R wave progression.   Recent Labs: 02/12/2016: B Natriuretic Peptide 249.4 02/16/2016: ALT 26 02/20/2016: BUN 41; Creatinine, Ser 0.97; Potassium 4.1; Sodium 133 03/07/2016: HGB 9.3; Platelets 105    Lipid Panel    Component Value Date/Time   CHOL 153 11/08/2015 0959   TRIG 139 11/08/2015 0959   HDL 59 11/08/2015 0959   CHOLHDL 2.5 12/26/2014 0356   VLDL 19 12/26/2014 0356   LDLCALC 66 11/08/2015 0959   LDLDIRECT 93.7 04/10/2011 1000       Other studies Reviewed: Additional studies/ records that were reviewed today  include:   Echo 04/05/15: Study Conclusions  - Left ventricle: The cavity size was normal. There was mild concentric hypertrophy. Systolic function was normal. The estimated ejection fraction was in the range of 50% to 55%. Wall motion was normal; there were no regional wall motion abnormalities. The study is not technically sufficient to allow evaluation of LV diastolic function. - Aortic valve: A mechanical prosthesis was present. The gradients across the prosthesis are similar to the study of 2012. There was mild regurgitation. Peak velocity (S): 325 cm/s. Mean gradient (S): 21 mm Hg. - Mitral valve: There was moderate regurgitation. - Left atrium: The atrium was moderately to severely dilated. - Right ventricle: The cavity size was moderately dilated. Wall thickness was normal. - Right atrium: The atrium was moderately to severely dilated. - Pulmonary arteries: Systolic pressure was mildly increased. PA peak pressure: 36 mm Hg (S).  ASSESSMENT AND PLAN:  # Tachycardia at 144 with her a fib will increase her coreg to 12.5 BID and decrease valsartan she will call if HR is < 60 with hx of brady in past on higher dose of BB  # Permanent atrial fibrillation: Remains in atrial fibrillation and rates are now tachycardic at times.  Continue carvedilol now at higher dose..  She is currently on lovenox Instead of warfarin, given her hemoptysis.  Agree with continuing this for at least 2 weeks after discharge.  This is managed by Dr. Jana Hakim.  Her INR goal  is 2-2.5 on warfarin.  # Hypertension: Blood pressure has been low lately.  Clonidine and amlodipine have been stopped..  She reports that she is eating and drinking well.   She will continue higher dose of carvedilol and lower dose of valsartan at 160 mg.  However, if her SBP is <120 mmHg, she won't take the valsartan.  # Chronic diastolic heart failure:  Ms. Blye seems to be euvolemic today.  Her breathing is the  best it has been in a while.  Continue furosemide and BP control as above.    This patients CHA2DS2-VASc Score and unadjusted Ischemic Stroke Rate (% per year) is equal to 4.8 % stroke rate/year from a score of 4  Above score calculated as 1 point each if present [CHF, HTN, DM, Vascular=MI/PAD/Aortic Plaque, Age if 65-74, or Female] Above score calculated as 2 points each if present [Age > 75, or Stroke/TIA/TE]   # s/p mechanical AVR: Stable with mildly increased gradients.  Mean gradient was 18 mmHg on echo 12/2015. INR goal 2-2.5 due to GI bleeding requiring transfusions.  Currently on Lovenox as above.   # Ascending arorta aneurysm: 4.7 cm on CT.  No intervention.    # Small cell lung cancer: She is currently undergoing palliative XRT.  She is more interested in quality of life than quantity and probably doesn't want chemotherapy. Current medicines are reviewed with the patient today.  The patient Has no concerns regarding medicines.  # abd tenderness at lovenox injection site I do not palpated hematoma today if pain increases she will call and will obtain CT scan of abd vs ultrasound with her hx of retroperitoneal bleed.   She will keep followup with Dr. Oval Linsey in 1 month.    The following changes have been made:  See above Labs/ tests ordered today include:see above  Disposition:   FU:  see above  Signed, Cecilie Kicks, NP  03/11/2016 10:59 AM    [address] [address], [place], [place] [place] [place], [place] Phone: [phone]; Fax: [phone]

## 2016-03-11 NOTE — Patient Instructions (Signed)
Medication Instructions: Decrease Valsartan to 160 mg daily.  Increase Carvedilol to 12.5 mg twice daily--if you blood pressure is higher than 110 tonight, go up to 12.5 mg starting tonight.   Follow-Up: Your physician recommends that you schedule a follow-up appointment in: 1 month with Dr. Oval Linsey.  If you need a refill on your cardiac medications before your next appointment, please call your pharmacy.

## 2016-03-12 ENCOUNTER — Other Ambulatory Visit: Payer: Self-pay | Admitting: Emergency Medicine

## 2016-03-12 ENCOUNTER — Telehealth: Payer: Self-pay | Admitting: Emergency Medicine

## 2016-03-12 DIAGNOSIS — D500 Iron deficiency anemia secondary to blood loss (chronic): Secondary | ICD-10-CM

## 2016-03-12 DIAGNOSIS — D689 Coagulation defect, unspecified: Secondary | ICD-10-CM

## 2016-03-12 DIAGNOSIS — C3491 Malignant neoplasm of unspecified part of right bronchus or lung: Secondary | ICD-10-CM

## 2016-03-12 DIAGNOSIS — I482 Chronic atrial fibrillation, unspecified: Secondary | ICD-10-CM

## 2016-03-12 DIAGNOSIS — I6330 Cerebral infarction due to thrombosis of unspecified cerebral artery: Secondary | ICD-10-CM

## 2016-03-12 NOTE — Telephone Encounter (Signed)
Patient's daughter called stating that patient is requesting to restart coumadin and stop the Lovenox injections; plan set up for patient to begin taking Coumadin '5mg'$ s PO on 03/11/16 @ hs. She is scheduled for labs on 12/13, 12/14, and 12/15; during that time Dr Jana Hakim will determine coumadin dose and instruct patient on when to stop the Lovenox. Patient's daughter verbalized understanding.

## 2016-03-13 ENCOUNTER — Other Ambulatory Visit (HOSPITAL_BASED_OUTPATIENT_CLINIC_OR_DEPARTMENT_OTHER): Payer: Medicare Other

## 2016-03-13 ENCOUNTER — Other Ambulatory Visit: Payer: Self-pay | Admitting: *Deleted

## 2016-03-13 ENCOUNTER — Other Ambulatory Visit: Payer: Self-pay | Admitting: Oncology

## 2016-03-13 ENCOUNTER — Other Ambulatory Visit: Payer: Self-pay | Admitting: Cardiovascular Disease

## 2016-03-13 DIAGNOSIS — C3401 Malignant neoplasm of right main bronchus: Secondary | ICD-10-CM

## 2016-03-13 DIAGNOSIS — C3491 Malignant neoplasm of unspecified part of right bronchus or lung: Secondary | ICD-10-CM

## 2016-03-13 DIAGNOSIS — D500 Iron deficiency anemia secondary to blood loss (chronic): Secondary | ICD-10-CM

## 2016-03-13 DIAGNOSIS — I4891 Unspecified atrial fibrillation: Secondary | ICD-10-CM | POA: Diagnosis not present

## 2016-03-13 DIAGNOSIS — I482 Chronic atrial fibrillation, unspecified: Secondary | ICD-10-CM

## 2016-03-13 DIAGNOSIS — D649 Anemia, unspecified: Secondary | ICD-10-CM | POA: Diagnosis not present

## 2016-03-13 DIAGNOSIS — K922 Gastrointestinal hemorrhage, unspecified: Secondary | ICD-10-CM

## 2016-03-13 LAB — CBC WITH DIFFERENTIAL/PLATELET
BASO%: 0.4 % (ref 0.0–2.0)
Basophils Absolute: 0 10*3/uL (ref 0.0–0.1)
EOS ABS: 0 10*3/uL (ref 0.0–0.5)
EOS%: 0.9 % (ref 0.0–7.0)
HCT: 30.2 % — ABNORMAL LOW (ref 34.8–46.6)
HEMOGLOBIN: 9.6 g/dL — AB (ref 11.6–15.9)
LYMPH%: 16.6 % (ref 14.0–49.7)
MCH: 32 pg (ref 25.1–34.0)
MCHC: 31.8 g/dL (ref 31.5–36.0)
MCV: 100.7 fL (ref 79.5–101.0)
MONO#: 0.2 10*3/uL (ref 0.1–0.9)
MONO%: 8.7 % (ref 0.0–14.0)
NEUT#: 2.1 10*3/uL (ref 1.5–6.5)
NEUT%: 73.4 % (ref 38.4–76.8)
Platelets: 131 10*3/uL — ABNORMAL LOW (ref 145–400)
RBC: 3 10*6/uL — AB (ref 3.70–5.45)
RDW: 16.9 % — AB (ref 11.2–14.5)
WBC: 2.8 10*3/uL — ABNORMAL LOW (ref 3.9–10.3)
lymph#: 0.5 10*3/uL — ABNORMAL LOW (ref 0.9–3.3)

## 2016-03-13 LAB — PROTIME-INR
INR: 1.1 — AB (ref 2.00–3.50)
Protime: 13.2 Seconds (ref 10.6–13.4)

## 2016-03-13 LAB — PREPARE RBC (CROSSMATCH)

## 2016-03-13 LAB — FERRITIN: Ferritin: 62 ng/ml (ref 9–269)

## 2016-03-13 NOTE — Telephone Encounter (Signed)
Please review for refill. Thanks!  

## 2016-03-14 ENCOUNTER — Other Ambulatory Visit (HOSPITAL_BASED_OUTPATIENT_CLINIC_OR_DEPARTMENT_OTHER): Payer: Medicare Other

## 2016-03-14 DIAGNOSIS — D500 Iron deficiency anemia secondary to blood loss (chronic): Secondary | ICD-10-CM

## 2016-03-14 DIAGNOSIS — I4891 Unspecified atrial fibrillation: Secondary | ICD-10-CM

## 2016-03-14 DIAGNOSIS — I6330 Cerebral infarction due to thrombosis of unspecified cerebral artery: Secondary | ICD-10-CM

## 2016-03-14 DIAGNOSIS — D689 Coagulation defect, unspecified: Secondary | ICD-10-CM

## 2016-03-14 DIAGNOSIS — C3491 Malignant neoplasm of unspecified part of right bronchus or lung: Secondary | ICD-10-CM

## 2016-03-14 DIAGNOSIS — I482 Chronic atrial fibrillation, unspecified: Secondary | ICD-10-CM

## 2016-03-14 LAB — PROTIME-INR
INR: 1.4 — AB (ref 2.00–3.50)
Protime: 16.8 Seconds — ABNORMAL HIGH (ref 10.6–13.4)

## 2016-03-15 ENCOUNTER — Ambulatory Visit (HOSPITAL_BASED_OUTPATIENT_CLINIC_OR_DEPARTMENT_OTHER): Payer: Medicare Other

## 2016-03-15 ENCOUNTER — Other Ambulatory Visit: Payer: Medicare Other

## 2016-03-15 ENCOUNTER — Other Ambulatory Visit (HOSPITAL_BASED_OUTPATIENT_CLINIC_OR_DEPARTMENT_OTHER): Payer: Medicare Other

## 2016-03-15 ENCOUNTER — Other Ambulatory Visit: Payer: Self-pay | Admitting: *Deleted

## 2016-03-15 ENCOUNTER — Telehealth: Payer: Self-pay | Admitting: *Deleted

## 2016-03-15 DIAGNOSIS — I4891 Unspecified atrial fibrillation: Secondary | ICD-10-CM

## 2016-03-15 DIAGNOSIS — I4819 Other persistent atrial fibrillation: Secondary | ICD-10-CM

## 2016-03-15 DIAGNOSIS — D689 Coagulation defect, unspecified: Secondary | ICD-10-CM

## 2016-03-15 DIAGNOSIS — I482 Chronic atrial fibrillation, unspecified: Secondary | ICD-10-CM

## 2016-03-15 DIAGNOSIS — C3401 Malignant neoplasm of right main bronchus: Secondary | ICD-10-CM | POA: Diagnosis present

## 2016-03-15 DIAGNOSIS — D500 Iron deficiency anemia secondary to blood loss (chronic): Secondary | ICD-10-CM | POA: Diagnosis present

## 2016-03-15 DIAGNOSIS — D649 Anemia, unspecified: Secondary | ICD-10-CM

## 2016-03-15 DIAGNOSIS — D62 Acute posthemorrhagic anemia: Secondary | ICD-10-CM

## 2016-03-15 DIAGNOSIS — Q2730 Arteriovenous malformation, site unspecified: Secondary | ICD-10-CM

## 2016-03-15 DIAGNOSIS — K921 Melena: Secondary | ICD-10-CM

## 2016-03-15 DIAGNOSIS — C3491 Malignant neoplasm of unspecified part of right bronchus or lung: Secondary | ICD-10-CM

## 2016-03-15 LAB — CBC WITH DIFFERENTIAL/PLATELET
BASO%: 0.3 % (ref 0.0–2.0)
BASOS ABS: 0 10*3/uL (ref 0.0–0.1)
EOS ABS: 0 10*3/uL (ref 0.0–0.5)
EOS%: 0.9 % (ref 0.0–7.0)
HCT: 26.8 % — ABNORMAL LOW (ref 34.8–46.6)
HGB: 8.4 g/dL — ABNORMAL LOW (ref 11.6–15.9)
LYMPH%: 20.8 % (ref 14.0–49.7)
MCH: 32.6 pg (ref 25.1–34.0)
MCHC: 31.3 g/dL — ABNORMAL LOW (ref 31.5–36.0)
MCV: 103.9 fL — AB (ref 79.5–101.0)
MONO#: 0.2 10*3/uL (ref 0.1–0.9)
MONO%: 6.9 % (ref 0.0–14.0)
NEUT#: 2.3 10*3/uL (ref 1.5–6.5)
NEUT%: 71.1 % (ref 38.4–76.8)
Platelets: 119 10*3/uL — ABNORMAL LOW (ref 145–400)
RBC: 2.58 10*6/uL — AB (ref 3.70–5.45)
RDW: 16.3 % — AB (ref 11.2–14.5)
WBC: 3.2 10*3/uL — ABNORMAL LOW (ref 3.9–10.3)
lymph#: 0.7 10*3/uL — ABNORMAL LOW (ref 0.9–3.3)

## 2016-03-15 LAB — PROTIME-INR
INR: 1.6 — AB (ref 2.00–3.50)
PROTIME: 19.2 s — AB (ref 10.6–13.4)

## 2016-03-15 MED ORDER — DIPHENHYDRAMINE HCL 25 MG PO CAPS
25.0000 mg | ORAL_CAPSULE | Freq: Once | ORAL | Status: AC
  Administered 2016-03-15: 25 mg via ORAL

## 2016-03-15 MED ORDER — SODIUM CHLORIDE 0.9% FLUSH
3.0000 mL | INTRAVENOUS | Status: DC | PRN
  Filled 2016-03-15: qty 10

## 2016-03-15 MED ORDER — DIPHENHYDRAMINE HCL 25 MG PO CAPS
ORAL_CAPSULE | ORAL | Status: AC
  Filled 2016-03-15: qty 1

## 2016-03-15 MED ORDER — ACETAMINOPHEN 325 MG PO TABS
ORAL_TABLET | ORAL | Status: AC
  Filled 2016-03-15: qty 2

## 2016-03-15 MED ORDER — HEPARIN SOD (PORK) LOCK FLUSH 100 UNIT/ML IV SOLN
500.0000 [IU] | Freq: Every day | INTRAVENOUS | Status: AC | PRN
  Administered 2016-03-15: 500 [IU]
  Filled 2016-03-15: qty 5

## 2016-03-15 MED ORDER — ACETAMINOPHEN 325 MG PO TABS
650.0000 mg | ORAL_TABLET | Freq: Once | ORAL | Status: AC
  Administered 2016-03-15: 650 mg via ORAL

## 2016-03-15 MED ORDER — SODIUM CHLORIDE 0.9 % IV SOLN
250.0000 mL | Freq: Once | INTRAVENOUS | Status: AC
  Administered 2016-03-15: 250 mL via INTRAVENOUS

## 2016-03-15 MED ORDER — SODIUM CHLORIDE 0.9 % IV SOLN
510.0000 mg | Freq: Once | INTRAVENOUS | Status: AC
  Administered 2016-03-15: 510 mg via INTRAVENOUS
  Filled 2016-03-15: qty 17

## 2016-03-15 MED ORDER — SODIUM CHLORIDE 0.9% FLUSH
10.0000 mL | INTRAVENOUS | Status: AC | PRN
  Administered 2016-03-15: 10 mL
  Filled 2016-03-15: qty 10

## 2016-03-15 NOTE — Telephone Encounter (Signed)
This RN spoke with pt per MD review of INR of 1.6.  Gave written instructions for pt to continue coumadin at 5 mg and to now decrease dose of lovenox to once a day for today and tomorrow then stop.  INR to be redone on Monday at lab appointment.  Pt is presently having dark tarry stools at present 2 times today with no other rectal bleeding.  Pt and daughter at bedside verbalized understanding.

## 2016-03-15 NOTE — Telephone Encounter (Signed)
No entry 

## 2016-03-15 NOTE — Progress Notes (Signed)
No Complications from Iron Infusion, post 30 observation vitals WDL.  Proceeded with pre meds for blood transfusion

## 2016-03-15 NOTE — Telephone Encounter (Signed)
NO ENTRY 

## 2016-03-15 NOTE — Patient Instructions (Signed)
Blood Transfusion , Adult A blood transfusion is a procedure in which you receive donated blood, including plasma, platelets, and red blood cells, through an IV tube. You may need a blood transfusion because of illness, surgery, or injury. The blood may come from a donor. You may also be able to donate blood for yourself (autologous blood donation) before a surgery if you know that you might require a blood transfusion. The blood given in a transfusion is made up of different types of cells. You may receive:  Red blood cells. These carry oxygen to the cells in the body.  White blood cells. These help you fight infections.  Platelets. These help your blood to clot.  Plasma. This is the liquid part of your blood and it helps with fluid imbalances. If you have hemophilia or another clotting disorder, you may also receive other types of blood products. Tell a health care provider about:  Any allergies you have.  All medicines you are taking, including vitamins, herbs, eye drops, creams, and over-the-counter medicines.  Any problems you or family members have had with anesthetic medicines.  Any blood disorders you have.  Any surgeries you have had.  Any medical conditions you have, including any recent fever or cold symptoms.  Whether you are pregnant or may be pregnant.  Any previous reactions you have had during a blood transfusion. What are the risks? Generally, this is a safe procedure. However, problems may occur, including:  Having an allergic reaction to something in the donated blood. Hives and itching may be symptoms of this type of reaction.  Fever. This may be a reaction to the white blood cells in the transfused blood. Nausea or chest pain may accompany a fever.  Iron overload. This can happen from having many transfusions.  Transfusion-related acute lung injury (TRALI). This is a rare reaction that causes lung damage. The cause is not known.TRALI can occur within hours  of a transfusion or several days later.  Sudden (acute) or delayed hemolytic reactions. This happens if your blood does not match the cells in your transfusion. Your body's defense system (immune system) may try to attack the new cells. This complication is rare. The symptoms include fever, chills, nausea, and low back pain or chest pain.  Infection or disease transmission. This is rare. What happens before the procedure?  You will have a blood test to determine your blood type. This is necessary to know what kind of blood your body will accept and to match it to the donor blood.  If you are going to have a planned surgery, you may be able to do an autologous blood donation. This may be done in case you need to have a transfusion.  If you have had an allergic reaction to a transfusion in the past, you may be given medicine to help prevent a reaction. This medicine may be given to you by mouth or through an IV tube.  You will have your temperature, blood pressure, and pulse monitored before the transfusion.  Follow instructions from your health care provider about eating and drinking restrictions.  Ask your health care provider about:  Changing or stopping your regular medicines. This is especially important if you are taking diabetes medicines or blood thinners.  Taking medicines such as aspirin and ibuprofen. These medicines can thin your blood. Do not take these medicines before your procedure if your health care provider instructs you not to. What happens during the procedure?  An IV tube will be   inserted into one of your veins.  The bag of donated blood will be attached to your IV tube. The blood will then enter through your vein.  Your temperature, blood pressure, and pulse will be monitored regularly during the transfusion. This monitoring is done to detect early signs of a transfusion reaction.  If you have any signs or symptoms of a reaction, your transfusion will be stopped and  you may be given medicine.  When the transfusion is complete, your IV tube will be removed.  Pressure may be applied to the IV site for a few minutes.  A bandage (dressing) will be applied. The procedure may vary among health care providers and hospitals. What happens after the procedure?  Your temperature, blood pressure, heart rate, breathing rate, and blood oxygen level will be monitored often.  Your blood may be tested to see how you are responding to the transfusion.  You may be warmed with fluids or blankets to maintain a normal body temperature. Summary  A blood transfusion is a procedure in which you receive donated blood, including plasma, platelets, and red blood cells, through an IV tube.  Your temperature, blood pressure, and pulse will be monitored before, during, and after the transfusion.  Your blood may be tested after the transfusion to see how your body has responded. This information is not intended to replace advice given to you by your health care provider. Make sure you discuss any questions you have with your health care provider. Document Released: 03/15/2000 Document Revised: 12/14/2015 Document Reviewed: 12/14/2015 Elsevier Interactive Patient Education  2017 [place] injection What is this medicine? FERUMOXYTOL is an iron complex. Iron is used to make healthy red blood cells, which carry oxygen and nutrients throughout the body. This medicine is used to treat iron deficiency anemia in people with chronic kidney disease. COMMON BRAND NAME(S): Feraheme What should I tell my health care provider before I take this medicine? They need to know if you have any of these conditions: -anemia not caused by low iron levels -high levels of iron in the blood -magnetic resonance imaging (MRI) test scheduled -an unusual or allergic reaction to iron, other medicines, foods, dyes, or preservatives -pregnant or trying to get  pregnant -breast-feeding How should I use this medicine? This medicine is for injection into a vein. It is given by a health care professional in a hospital or clinic setting. Talk to your pediatrician regarding the use of this medicine in children. Special care may be needed. What if I miss a dose? It is important not to miss your dose. Call your doctor or health care professional if you are unable to keep an appointment. What may interact with this medicine? This medicine may interact with the following medications: -other iron products What should I watch for while using this medicine? Visit your doctor or healthcare professional regularly. Tell your doctor or healthcare professional if your symptoms do not start to get better or if they get worse. You may need blood work done while you are taking this medicine. You may need to follow a special diet. Talk to your doctor. Foods that contain iron include: whole grains/cereals, dried fruits, beans, or peas, leafy green vegetables, and organ meats (liver, kidney). What side effects may I notice from receiving this medicine? Side effects that you should report to your doctor or health care professional as soon as possible: -allergic reactions like skin rash, itching or hives, swelling of the face, lips, or tongue -  breathing problems -changes in blood pressure -feeling faint or lightheaded, falls -fever or chills -flushing, sweating, or hot feelings -swelling of the ankles or feet Side effects that usually do not require medical attention (report to your doctor or health care professional if they continue or are bothersome): -diarrhea -headache -nausea, vomiting -stomach pain Where should I keep my medicine? This drug is given in a hospital or clinic and will not be stored at home.  2017 [employer]vier/[employer] Standard (2015-04-20 12:41:49)

## 2016-03-18 ENCOUNTER — Encounter: Payer: Self-pay | Admitting: Internal Medicine

## 2016-03-18 ENCOUNTER — Other Ambulatory Visit: Payer: Self-pay | Admitting: Medical Oncology

## 2016-03-18 ENCOUNTER — Ambulatory Visit (HOSPITAL_BASED_OUTPATIENT_CLINIC_OR_DEPARTMENT_OTHER): Payer: Medicare Other | Admitting: Internal Medicine

## 2016-03-18 ENCOUNTER — Ambulatory Visit (HOSPITAL_COMMUNITY)
Admission: RE | Admit: 2016-03-18 | Discharge: 2016-03-18 | Disposition: A | Discharge status: Home / Self Care | Payer: Medicare Other | Source: Ambulatory Visit | Attending: Oncology | Admitting: Oncology

## 2016-03-18 ENCOUNTER — Other Ambulatory Visit (HOSPITAL_BASED_OUTPATIENT_CLINIC_OR_DEPARTMENT_OTHER): Payer: Medicare Other

## 2016-03-18 ENCOUNTER — Telehealth: Payer: Self-pay | Admitting: Medical Oncology

## 2016-03-18 ENCOUNTER — Telehealth: Payer: Self-pay | Admitting: Internal Medicine

## 2016-03-18 ENCOUNTER — Ambulatory Visit (HOSPITAL_BASED_OUTPATIENT_CLINIC_OR_DEPARTMENT_OTHER): Payer: Medicare Other

## 2016-03-18 VITALS — BP 140/58 | HR 104 | Temp 98.1°F | Resp 18 | Ht 60.0 in | Wt 128.0 lb

## 2016-03-18 DIAGNOSIS — C3491 Malignant neoplasm of unspecified part of right bronchus or lung: Secondary | ICD-10-CM

## 2016-03-18 DIAGNOSIS — I509 Heart failure, unspecified: Secondary | ICD-10-CM

## 2016-03-18 DIAGNOSIS — I482 Chronic atrial fibrillation, unspecified: Secondary | ICD-10-CM

## 2016-03-18 DIAGNOSIS — C7971 Secondary malignant neoplasm of right adrenal gland: Secondary | ICD-10-CM

## 2016-03-18 DIAGNOSIS — Z7901 Long term (current) use of anticoagulants: Secondary | ICD-10-CM | POA: Diagnosis not present

## 2016-03-18 DIAGNOSIS — D638 Anemia in other chronic diseases classified elsewhere: Secondary | ICD-10-CM | POA: Diagnosis not present

## 2016-03-18 DIAGNOSIS — I10 Essential (primary) hypertension: Secondary | ICD-10-CM

## 2016-03-18 DIAGNOSIS — K922 Gastrointestinal hemorrhage, unspecified: Secondary | ICD-10-CM

## 2016-03-18 DIAGNOSIS — C3401 Malignant neoplasm of right main bronchus: Secondary | ICD-10-CM | POA: Diagnosis present

## 2016-03-18 DIAGNOSIS — R0602 Shortness of breath: Secondary | ICD-10-CM

## 2016-03-18 DIAGNOSIS — D500 Iron deficiency anemia secondary to blood loss (chronic): Secondary | ICD-10-CM

## 2016-03-18 LAB — PROTIME-INR
INR: 4.3 — AB (ref 2.00–3.50)
Protime: 51.6 Seconds — ABNORMAL HIGH (ref 10.6–13.4)

## 2016-03-18 LAB — CBC WITH DIFFERENTIAL/PLATELET
BASO%: 0.3 % (ref 0.0–2.0)
BASOS ABS: 0 10*3/uL (ref 0.0–0.1)
EOS%: 1.4 % (ref 0.0–7.0)
Eosinophils Absolute: 0.1 10*3/uL (ref 0.0–0.5)
HCT: 29.6 % — ABNORMAL LOW (ref 34.8–46.6)
HEMOGLOBIN: 9 g/dL — AB (ref 11.6–15.9)
LYMPH#: 0.5 10*3/uL — AB (ref 0.9–3.3)
LYMPH%: 13.4 % — ABNORMAL LOW (ref 14.0–49.7)
MCH: 31.6 pg (ref 25.1–34.0)
MCHC: 30.4 g/dL — ABNORMAL LOW (ref 31.5–36.0)
MCV: 103.9 fL — AB (ref 79.5–101.0)
MONO#: 0.3 10*3/uL (ref 0.1–0.9)
MONO%: 8.9 % (ref 0.0–14.0)
NEUT#: 2.7 10*3/uL (ref 1.5–6.5)
NEUT%: 76 % (ref 38.4–76.8)
Platelets: 177 10*3/uL (ref 145–400)
RBC: 2.85 10*6/uL — AB (ref 3.70–5.45)
RDW: 16.9 % — AB (ref 11.2–14.5)
WBC: 3.6 10*3/uL — ABNORMAL LOW (ref 3.9–10.3)
nRBC: 1 % — ABNORMAL HIGH (ref 0–0)

## 2016-03-18 LAB — COMPREHENSIVE METABOLIC PANEL
ALBUMIN: 2.8 g/dL — AB (ref 3.5–5.0)
ALT: 19 U/L (ref 0–55)
AST: 14 U/L (ref 5–34)
Alkaline Phosphatase: 58 U/L (ref 40–150)
Anion Gap: 5 mEq/L (ref 3–11)
BUN: 56.8 mg/dL — AB (ref 7.0–26.0)
CO2: 37 meq/L — AB (ref 22–29)
Calcium: 9.9 mg/dL (ref 8.4–10.4)
Chloride: 100 mEq/L (ref 98–109)
Creatinine: 1 mg/dL (ref 0.6–1.1)
EGFR: 50 mL/min/{1.73_m2} — AB (ref >90)
GLUCOSE: 108 mg/dL (ref 70–140)
POTASSIUM: 4.8 meq/L (ref 3.5–5.1)
SODIUM: 141 meq/L (ref 136–145)
Total Bilirubin: <0.22 mg/dL (ref 0.20–1.20)
Total Protein: 5.4 g/dL — ABNORMAL LOW (ref 6.4–8.3)

## 2016-03-18 LAB — TYPE AND SCREEN
BLOOD PRODUCT EXPIRATION DATE: 201712282359
ISSUE DATE / TIME: 201712151408
UNIT TYPE AND RH: 5100

## 2016-03-18 NOTE — Telephone Encounter (Signed)
Added lab 1/8 and MM 1/10 per 12/18 los. [facility] will call re scan.    Other lab/infusions ordered per GM and per dtr will remain as scheduled for now.

## 2016-03-18 NOTE — Progress Notes (Signed)
[place] [phone]   [phone]  OFFICE PROGRESS NOTE  Mathews Argyle, MD [address] [place] [address]  DIAGNOSIS: Extensive stage (T2b, N2, M1b) small cell lung cancer diagnosed in November 2017 and presented with large right hilar mass, mediastinal lymphadenopathy and metastatic left adrenal gland nodule.  PRIOR THERAPY: Status post palliative radiotherapy to the right lung mass for a total dose of 30 GY in 10 fractions completed 03/05/2016  CURRENT THERAPY: None.  INTERVAL HISTORY: Deborah [name] 82 y.o. female returns to the clinic today for follow-up visit accompanied by her son, daughter and friend. Deborah [name] continues to complain of increasing fatigue and weakness as well as shortness of breath. Deborah [name] also has swelling of her lower extremities. Deborah [name] is currently on Lasix 40 mg by mouth twice a day. Deborah [name] switched her anticoagulation from Lovenox to Coumadin started few days ago and Deborah [name] is currently on 5 mg by mouth daily. Deborah [name] denied having any weight loss or night sweats. Deborah [name] has no nausea or vomiting. Deborah [name] has no headache or visual changes. Deborah [name] tolerated the course of palliative radiotherapy to the chest. Deborah [name] is here today for evaluation and discussion of her treatment options.  MEDICAL HISTORY: Past Medical History:  Diagnosis Date  . Acute CHF (congestive heart failure) ([place]) 01/10/2016  . Anemia   . Arthritis    "fingers, toes, hips; qwhere" (05/19/2013)  . Breast cancer ([place]) dx'd 1994   "right; S/P lumpectomy, chemo, XRT"   . Chronic lower GI bleeding   . Complication of anesthesia    "once I'm awakened, I don't sleep til the following night" BP drops very low had to stop putting in [place] -a- cath  . GERD (gastroesophageal reflux disease)   . Gout   . Heart murmur   . High cholesterol   . History of blood transfusion 1996; ~ 2013   "related to valve replacement; GI bleeding"   . History of breast cancer  02/16/2016  . History of echocardiogram    Echo 1/17: mild LVH, EF 50-55%, no RWMA, mild AI, mechanical AVR with mean 21 mmHg/peak 42 mmHg (stable since 2012), mod MR, mod to severe BAE, PASP 36 mmHg  . History of nuclear stress test    Myoview 1/17: EF 59%, normal perfusion, low risk  . Hypertension   . Hypothyroidism   . Lung cancer ([place]) dx'd 01/2016   SCL  . On supplemental oxygen therapy   . Permanent atrial fibrillation ([place])       . Pneumonia 07/2011  . Rectus sheath hematoma 12/19/14    held anticoagulation for 1 week, transfused  . Small cell lung cancer, right ([place]) 02/16/2016  . Stomach problems    seeing Dr Watt Climes  . TIA (transient ischemic attack) 12/24/14   symptoms resolved    ALLERGIES:  is allergic to alendronate sodium; amiodarone; chlorthalidone; macrodantin [nitrofurantoin]; meclizine; norvasc [amlodipine besylate]; hydralazine hcl; sulfa antibiotics; and tussionex pennkinetic er [hydrocod polst-cpm polst er].  MEDICATIONS:  Current Outpatient Prescriptions  Medication Sig Dispense Refill  . acetaminophen (TYLENOL) 500 MG tablet Take 1,000 mg by mouth every 6 (six) hours as needed for mild pain, moderate pain or headache. Reported on 09/26/2015    . allopurinol (ZYLOPRIM) 300 MG tablet Take 300 mg by mouth at bedtime.     . calcium-vitamin D 500 MG tablet Take 1 tablet by mouth 2 (two) times daily. Reported on 09/26/2015    . carvedilol (COREG)  12.5 MG tablet Take 1 tablet (12.5 mg total) by mouth 2 (two) times daily with a meal. 60 tablet 6  . celecoxib (CELEBREX) 200 MG capsule Take 200 mg by mouth daily as needed for moderate pain.   4  . dexamethasone (DECADRON) 4 MG tablet Take 2 tablets (8 mg total) by mouth 2 (two) times daily with a meal. 40 tablet 3  . dicyclomine (BENTYL) 10 MG capsule Take 10 mg by mouth daily as needed for spasms.     Marland Kitchen enoxaparin (LOVENOX) 100 MG/ML injection Inject 0.5 mLs (50 mg total) into the skin every 12 (twelve) hours. Please  dispense 2 week supply, zero refills 0 Syringe   . fexofenadine (ALLEGRA ALLERGY) 180 MG tablet Take 180 mg by mouth daily.     . furosemide (LASIX) 40 MG tablet Take 1-2 tablets daily as directed. (Patient taking differently: Take 40 mg by mouth 2 (two) times daily as needed for fluid. ) 45 tablet 6  . hydrOXYzine (ATARAX/VISTARIL) 25 MG tablet Take 25 mg by mouth 3 (three) times daily as needed for anxiety.    Marland Kitchen levothyroxine (SYNTHROID, LEVOTHROID) 88 MCG tablet Take 1 tablet (88 mcg total) by mouth daily before breakfast. 90 tablet 3  . LORazepam (ATIVAN) 0.5 MG tablet Take 1 tablet (0.5 mg total) by mouth every 8 (eight) hours as needed for anxiety. 30 tablet 0  . NITROSTAT 0.4 MG SL tablet Place 1 tablet (0.4 mg total) under the tongue every 5 (five) minutes as needed for chest pain (x 3 doses). Reported on 10/17/2015 25 tablet 5  . OXYGEN Inhale 2 L into the lungs continuous.    . potassium chloride SA (K-DUR,KLOR-CON) 20 MEQ tablet Take 0.5 tablets (10 mEq total) by mouth 2 (two) times daily as needed (taking with Lasix).    . Probiotic Product (PROBIOTIC ADVANCED PO) Take 1 tablet by mouth daily. Reported on 10/17/2015    . simvastatin (ZOCOR) 20 MG tablet Take 10 mg by mouth daily.     . traMADol (ULTRAM) 50 MG tablet Take 1 tablet (50 mg total) by mouth every 6 (six) hours as needed. (Patient taking differently: Take 50 mg by mouth every 6 (six) hours as needed for moderate pain or severe pain. ) 30 tablet 0  . valsartan (DIOVAN) 160 MG tablet Take 2 tablets (320 mg total) by mouth daily. 30 tablet 6  . valsartan (DIOVAN) 320 MG tablet TAKE ONE TABLET BY MOUTH ONCE DAILY 30 tablet 2  . Wound Dressings (SONAFINE) Apply 1 application topically daily.    Marland Kitchen zolpidem (AMBIEN) 5 MG tablet Take 5 mg by mouth at bedtime.      No current facility-administered medications for this visit.     SURGICAL HISTORY:  Past Surgical History:  Procedure Laterality Date  . ABDOMINAL HYSTERECTOMY    .  AORTIC VALVE REPLACEMENT  1996   [employer]  . APPENDECTOMY    . arteriogram  01/12   NO RAS   . BREAST BIOPSY Right   . BREAST LUMPECTOMY Right   . CARDIAC CATHETERIZATION    . CARDIOVERSION     X 2  . CATARACT EXTRACTION W/ INTRAOCULAR LENS  IMPLANT, BILATERAL Bilateral    bilateral cataract with lens implants  . CHOLECYSTECTOMY    . COLONOSCOPY WITH PROPOFOL N/A 11/24/2012   Procedure: COLONOSCOPY WITH PROPOFOL;  Surgeon: Jeryl Columbia, MD;  Location: [facility] ENDOSCOPY;  Service: Endoscopy;  Laterality: N/A;  . ESOPHAGOGASTRODUODENOSCOPY (EGD) WITH PROPOFOL N/A 07/25/2015  Procedure: ESOPHAGOGASTRODUODENOSCOPY (EGD) WITH PROPOFOL;  Surgeon: Clarene Essex, MD;  Location: [facility] ENDOSCOPY;  Service: Endoscopy;  Laterality: N/A;  . HOT HEMOSTASIS N/A 07/25/2015   Procedure: HOT HEMOSTASIS (ARGON PLASMA COAGULATION/BICAP);  Surgeon: Clarene Essex, MD;  Location: [facility]COPY;  Service: Endoscopy;  Laterality: N/A;  . VIDEO BRONCHOSCOPY WITH ENDOBRONCHIAL ULTRASOUND N/A 02/07/2016   Procedure: VIDEO BRONCHOSCOPY WITH ENDOBRONCHIAL ULTRASOUND;  Surgeon: Collene Gobble, MD;  Location: [facility];  Service: Thoracic;  Laterality: N/A;    REVIEW OF SYSTEMS:  Constitutional: positive for fatigue Eyes: negative Ears, nose, mouth, throat, and face: negative Respiratory: positive for cough and dyspnea on exertion Cardiovascular: negative Gastrointestinal: negative Genitourinary:negative Integument/breast: negative Hematologic/lymphatic: negative Musculoskeletal:positive for muscle weakness Neurological: negative Behavioral/Psych: negative Endocrine: negative Allergic/Immunologic: negative   PHYSICAL EXAMINATION: General appearance: alert, cooperative, fatigued and no distress Head: Normocephalic, without obvious abnormality, atraumatic Neck: no adenopathy, no JVD, supple, symmetrical, trachea midline and thyroid not enlarged, symmetric, no tenderness/mass/nodules Lymph nodes: Cervical, supraclavicular, and  axillary nodes normal. Resp: diminished breath sounds RLL and dullness to percussion RLL Back: symmetric, no curvature. ROM normal. No CVA tenderness. Cardio: regular rate and rhythm, S1, S2 normal, no murmur, click, rub or gallop GI: soft, non-tender; bowel sounds normal; no masses,  no organomegaly Extremities: extremities normal, atraumatic, no cyanosis or edema Neurologic: Alert and oriented X 3, normal strength and tone. Normal symmetric reflexes. Normal coordination and gait  ECOG PERFORMANCE STATUS: 2 - Symptomatic, <50% confined to bed  Blood pressure (!) 140/58, pulse (!) 104, temperature 98.1 F (36.7 C), temperature source Oral, resp. rate 18, height 5' (1.524 m), weight 128 lb (58.1 kg), SpO2 93 %.  LABORATORY DATA: Lab Results  Component Value Date   WBC 3.6 (L) 03/18/2016   HGB 9.0 (L) 03/18/2016   HCT 29.6 (L) 03/18/2016   MCV 103.9 (H) 03/18/2016   PLT 177 03/18/2016      Chemistry      Component Value Date/Time   NA 133 (L) 02/20/2016 0204   NA 137 02/16/2016 1212   K 4.1 02/20/2016 0204   K 4.6 02/16/2016 1212   CL 94 (L) 02/20/2016 0204   CO2 34 (H) 02/20/2016 0204   CO2 31 (H) 02/16/2016 1212   BUN 41 (H) 02/20/2016 0204   BUN 29.4 (H) 02/16/2016 1212   CREATININE 0.97 02/20/2016 0204   CREATININE 0.8 02/16/2016 1212      Component Value Date/Time   CALCIUM 9.3 02/20/2016 0204   CALCIUM 10.6 (H) 02/16/2016 1212   ALKPHOS 55 02/16/2016 1212   AST 20 02/16/2016 1212   ALT 26 02/16/2016 1212   BILITOT 0.66 02/16/2016 1212       RADIOGRAPHIC STUDIES: Dg Chest 1 View  Result Date: 02/18/2016 CLINICAL DATA:  Right thoracentesis. EXAM: CHEST 1 VIEW COMPARISON:  Chest x-ray from yesterday FINDINGS: Diminished right pleural effusion. No pneumothorax. Known right hilar mass and lower lobe atelectasis. Cardiopericardial enlargement. Porta catheter on the left with tip at a left SVC. IMPRESSION: 1. No acute finding after right thoracentesis. 2. Right  basilar atelectasis and known right hilar mass. Electronically Signed   By: Monte Fantasia M.D.   On: 02/18/2016 11:12   Dg Chest 2 View  Result Date: 03/07/2016 CLINICAL DATA:  Shortness of breath. EXAM: CHEST  2 VIEW COMPARISON:  February 18, 2016 FINDINGS: Stable left Port-A-Cath. No pneumothorax. There is a small right pleural effusion which is larger in the interval. Increased interstitial opacities suggest mild edema. No other interval changes.  IMPRESSION: 1. Small right pleural effusion, larger in the interval. 2. Suspected mild edema. Electronically Signed   By: Dorise Bullion III M.D   On: 03/07/2016 13:48   Dg Chest 2 View  Result Date: 02/17/2016 CLINICAL DATA:  Status post lung biopsy. Shortness of breath and hemoptysis. EXAM: CHEST  2 VIEW COMPARISON:  Chest radiograph 02/12/2016 FINDINGS: Unchanged appearance of left chest wall Port-A-Cath with tip in the persistent left-sided SVC. There is aortic atherosclerosis. Median sternotomy wires and right axillary surgical clips are unchanged. Right pleural effusion has increased in size. The left lung is clear. No pneumothorax. No pulmonary edema. IMPRESSION: 1. Increased size of right pleural effusion with associated atelectasis. 2. Aortic atherosclerosis. Electronically Signed   By: Ulyses Jarred M.D.   On: 02/17/2016 22:51   Ct Angio Chest Aorta W/cm &/or Wo/cm  Result Date: 02/18/2016 CLINICAL DATA:  Hemoptysis. Mechanical bowel. Considering empiric bronchial embolization. EXAM: CT ANGIOGRAPHY CHEST WITH CONTRAST TECHNIQUE: Multidetector CT imaging of the chest was performed using the standard protocol during bolus administration of intravenous contrast. Multiplanar CT image reconstructions and MIPs were obtained to evaluate the vascular anatomy. CONTRAST:  100 cc Isovue 370 intravenous COMPARISON:  02/12/2016 FINDINGS: Cardiovascular: Chronic cardiomegaly. The atria are particularly enlarged. Mechanical aortic valve without para valvular  collection. Aneurysmal ascending aorta measuring up to 49 mm diameter. Aberrant right subclavian artery with retroesophageal course. There is a persistent left SVC with indwelling porta catheter. Two bronchial arteries are seen and are not hyper trophic. The more proximal has its origin near the aberrant left subclavian artery ostium. No pseudoaneurysm or active hemorrhage seen within the patient's malignancy. Pulmonary hypertension. No identified acute pulmonary embolism Mediastinum/Nodes: Known right hilar malignancy with airway obstruction. Necrosis present in right paratracheal lymph nodes. Lungs/Pleura: Material cast the right lower lobe and middle lobe airways with right lower lobe collapse. In this setting, this may reflect hemorrhage. Clusters of micronodules in the right upper lobe and left upper lobe that are chronic. No left-sided source of hemorrhage identified. Mild interstitial coarsening and ground-glass opacity in the right upper lobe is likely from vascular/lymphatic obstruction Upper Abdomen: No acute finding. Polycystic kidneys. Known left adrenal mass that is hypermetabolic by CT. Musculoskeletal: L1-2 advanced disc disease with sclerosis and subchondral cysts, degenerative appearing. No acute or aggressive finding. Review of the MIP images confirms the above findings. IMPRESSION: 1. Two non hypertrophied bronchial arteries identified. 2. Known right upper lobe malignancy with airway obstruction and bronchial artery/vein narrowing. Material cast the right lower lobe and right middle lobe airways, new from 01/25/2016, presumably clot in this setting. The right lower lobe is completely collapsed. 3. Chronic mild bronchiolitis changes in the left upper lobe. No left-sided source of hemoptysis identified. 4. Ascending aortic aneurysm with aberrant right subclavian artery. Electronically Signed   By: Monte Fantasia M.D.   On: 02/18/2016 12:32   US Thoracentesis Asp Pleural Space W/img  Guide  Result Date: 02/18/2016 INDICATION: Lung cancer. Shortness of breath. Right-sided pleural effusion. Request diagnostic and therapeutic thoracentesis. EXAM: ULTRASOUND GUIDED RIGHT THORACENTESIS MEDICATIONS: None. COMPLICATIONS: None immediate. Postprocedural chest x-ray negative for pneumothorax. PROCEDURE: An ultrasound guided thoracentesis was thoroughly discussed with the patient and questions answered. The benefits, risks, alternatives and complications were also discussed. The patient understands and wishes to proceed with the procedure. Written consent was obtained. Ultrasound was performed to localize and mark an adequate pocket of fluid in the right chest. The area was then prepped and draped in the normal sterile  fashion. 1% Lidocaine was used for local anesthesia. Under ultrasound guidance a Safe-T-Centesis catheter was introduced. Thoracentesis was performed. The catheter was removed and a dressing applied. FINDINGS: A total of approximately 750 mL of clear yellow fluid was removed. Samples were sent to the laboratory as requested by the clinical team. IMPRESSION: Successful ultrasound guided right thoracentesis yielding 750 mL of pleural fluid. Read by: Ascencion Dike PA-C Electronically Signed   By: Corrie Mckusick D.O.   On: 02/18/2016 11:00    ASSESSMENT AND PLAN: This is a very pleasant 82 years old white female with: 1) extensive stage small cell lung cancer, status post palliative radiotherapy to the large right upper lobe and mediastinal mass completed on 03/05/2016. The patient did not feel much difference before or after the radiation. I recommended for her to continue on observation with repeat CT scan of the chest in 3 weeks for restaging of her disease. The patient is not interested in any systemic chemotherapy. 2) questionable right pleural effusion: I will order repeat chest x-ray today for evaluation of her condition and if needed I will arrange for the patient to have  ultrasound guided thoracentesis. 3) anemia of chronic disease plus/minus iron deficiency: Deborah [name]'ll receive Feraheme infusion recently. I will continue to monitor her hemoglobin and hematocrit closely. 4) hypertension: The patient will continue on her current blood pressure medication with Coreg, Diovan and Lasix. 6) long-term anticoagulation: I will order PT/INR today to adjust her dose of Coumadin. The patient voices understanding of current disease status and treatment options and is in agreement with the current care plan.  All questions were answered. The patient knows to call the clinic with any problems, questions or concerns. We can certainly see the patient much sooner if necessary.  Disclaimer: This note was dictated with voice recognition software. Similar sounding words can inadvertently be transcribed and may not be corrected upon review.

## 2016-03-18 NOTE — Progress Notes (Signed)
Pt instructed to take coumadin 2.5 mg on mon , wed, and Friday and 5 mg the other days.

## 2016-03-18 NOTE — Telephone Encounter (Signed)
Reviewed coumadin dosing instructions with pt.

## 2016-03-19 ENCOUNTER — Other Ambulatory Visit: Payer: Self-pay

## 2016-03-19 ENCOUNTER — Inpatient Hospital Stay (HOSPITAL_COMMUNITY)
Admission: EM | Admit: 2016-03-19 | Discharge: 2016-03-22 | DRG: 180 | Disposition: A | Discharge status: Skilled Nursing Facility | Payer: Medicare Other | Attending: Family Medicine | Admitting: Family Medicine

## 2016-03-19 ENCOUNTER — Encounter (HOSPITAL_COMMUNITY): Payer: Self-pay | Admitting: Emergency Medicine

## 2016-03-19 ENCOUNTER — Inpatient Hospital Stay (HOSPITAL_COMMUNITY): Payer: Medicare Other

## 2016-03-19 ENCOUNTER — Emergency Department (HOSPITAL_COMMUNITY): Payer: Medicare Other

## 2016-03-19 DIAGNOSIS — I110 Hypertensive heart disease with heart failure: Secondary | ICD-10-CM | POA: Diagnosis present

## 2016-03-19 DIAGNOSIS — R0600 Dyspnea, unspecified: Secondary | ICD-10-CM

## 2016-03-19 DIAGNOSIS — Z515 Encounter for palliative care: Secondary | ICD-10-CM | POA: Diagnosis not present

## 2016-03-19 DIAGNOSIS — M109 Gout, unspecified: Secondary | ICD-10-CM | POA: Diagnosis present

## 2016-03-19 DIAGNOSIS — I472 Ventricular tachycardia: Secondary | ICD-10-CM

## 2016-03-19 DIAGNOSIS — R590 Localized enlarged lymph nodes: Secondary | ICD-10-CM

## 2016-03-19 DIAGNOSIS — J90 Pleural effusion, not elsewhere classified: Secondary | ICD-10-CM

## 2016-03-19 DIAGNOSIS — E278 Other specified disorders of adrenal gland: Secondary | ICD-10-CM | POA: Diagnosis present

## 2016-03-19 DIAGNOSIS — J9601 Acute respiratory failure with hypoxia: Secondary | ICD-10-CM | POA: Diagnosis present

## 2016-03-19 DIAGNOSIS — R0602 Shortness of breath: Secondary | ICD-10-CM | POA: Diagnosis present

## 2016-03-19 DIAGNOSIS — I712 Thoracic aortic aneurysm, without rupture: Secondary | ICD-10-CM

## 2016-03-19 DIAGNOSIS — Z87891 Personal history of nicotine dependence: Secondary | ICD-10-CM

## 2016-03-19 DIAGNOSIS — Z825 Family history of asthma and other chronic lower respiratory diseases: Secondary | ICD-10-CM

## 2016-03-19 DIAGNOSIS — R918 Other nonspecific abnormal finding of lung field: Secondary | ICD-10-CM

## 2016-03-19 DIAGNOSIS — Z9981 Dependence on supplemental oxygen: Secondary | ICD-10-CM | POA: Diagnosis not present

## 2016-03-19 DIAGNOSIS — E876 Hypokalemia: Secondary | ICD-10-CM

## 2016-03-19 DIAGNOSIS — I482 Chronic atrial fibrillation, unspecified: Secondary | ICD-10-CM

## 2016-03-19 DIAGNOSIS — Z79899 Other long term (current) drug therapy: Secondary | ICD-10-CM

## 2016-03-19 DIAGNOSIS — I10 Essential (primary) hypertension: Secondary | ICD-10-CM

## 2016-03-19 DIAGNOSIS — Z8249 Family history of ischemic heart disease and other diseases of the circulatory system: Secondary | ICD-10-CM

## 2016-03-19 DIAGNOSIS — E7800 Pure hypercholesterolemia, unspecified: Secondary | ICD-10-CM | POA: Diagnosis present

## 2016-03-19 DIAGNOSIS — D689 Coagulation defect, unspecified: Secondary | ICD-10-CM

## 2016-03-19 DIAGNOSIS — Z923 Personal history of irradiation: Secondary | ICD-10-CM

## 2016-03-19 DIAGNOSIS — R5383 Other fatigue: Secondary | ICD-10-CM

## 2016-03-19 DIAGNOSIS — Q2730 Arteriovenous malformation, site unspecified: Secondary | ICD-10-CM

## 2016-03-19 DIAGNOSIS — Z66 Do not resuscitate: Secondary | ICD-10-CM | POA: Diagnosis present

## 2016-03-19 DIAGNOSIS — Z800 Family history of malignant neoplasm of digestive organs: Secondary | ICD-10-CM

## 2016-03-19 DIAGNOSIS — E860 Dehydration: Secondary | ICD-10-CM | POA: Diagnosis present

## 2016-03-19 DIAGNOSIS — Z801 Family history of malignant neoplasm of trachea, bronchus and lung: Secondary | ICD-10-CM

## 2016-03-19 DIAGNOSIS — I878 Other specified disorders of veins: Secondary | ICD-10-CM

## 2016-03-19 DIAGNOSIS — Z952 Presence of prosthetic heart valve: Secondary | ICD-10-CM

## 2016-03-19 DIAGNOSIS — I509 Heart failure, unspecified: Secondary | ICD-10-CM | POA: Diagnosis present

## 2016-03-19 DIAGNOSIS — R471 Dysarthria and anarthria: Secondary | ICD-10-CM

## 2016-03-19 DIAGNOSIS — D500 Iron deficiency anemia secondary to blood loss (chronic): Secondary | ICD-10-CM | POA: Diagnosis present

## 2016-03-19 DIAGNOSIS — C3411 Malignant neoplasm of upper lobe, right bronchus or lung: Principal | ICD-10-CM | POA: Diagnosis present

## 2016-03-19 DIAGNOSIS — K922 Gastrointestinal hemorrhage, unspecified: Secondary | ICD-10-CM | POA: Diagnosis present

## 2016-03-19 DIAGNOSIS — C771 Secondary and unspecified malignant neoplasm of intrathoracic lymph nodes: Secondary | ICD-10-CM | POA: Diagnosis present

## 2016-03-19 DIAGNOSIS — I959 Hypotension, unspecified: Secondary | ICD-10-CM | POA: Diagnosis present

## 2016-03-19 DIAGNOSIS — R0902 Hypoxemia: Secondary | ICD-10-CM | POA: Diagnosis present

## 2016-03-19 DIAGNOSIS — C3401 Malignant neoplasm of right main bronchus: Secondary | ICD-10-CM

## 2016-03-19 DIAGNOSIS — Z853 Personal history of malignant neoplasm of breast: Secondary | ICD-10-CM

## 2016-03-19 DIAGNOSIS — I6330 Cerebral infarction due to thrombosis of unspecified cerebral artery: Secondary | ICD-10-CM

## 2016-03-19 DIAGNOSIS — E871 Hypo-osmolality and hyponatremia: Secondary | ICD-10-CM

## 2016-03-19 DIAGNOSIS — J910 Malignant pleural effusion: Secondary | ICD-10-CM | POA: Diagnosis present

## 2016-03-19 DIAGNOSIS — I7121 Aneurysm of the ascending aorta, without rupture: Secondary | ICD-10-CM

## 2016-03-19 DIAGNOSIS — R5381 Other malaise: Secondary | ICD-10-CM

## 2016-03-19 DIAGNOSIS — J40 Bronchitis, not specified as acute or chronic: Secondary | ICD-10-CM

## 2016-03-19 DIAGNOSIS — E039 Hypothyroidism, unspecified: Secondary | ICD-10-CM | POA: Diagnosis present

## 2016-03-19 DIAGNOSIS — Z8673 Personal history of transient ischemic attack (TIA), and cerebral infarction without residual deficits: Secondary | ICD-10-CM | POA: Diagnosis not present

## 2016-03-19 DIAGNOSIS — I119 Hypertensive heart disease without heart failure: Secondary | ICD-10-CM

## 2016-03-19 DIAGNOSIS — I4729 Other ventricular tachycardia: Secondary | ICD-10-CM

## 2016-03-19 DIAGNOSIS — Z803 Family history of malignant neoplasm of breast: Secondary | ICD-10-CM

## 2016-03-19 DIAGNOSIS — D62 Acute posthemorrhagic anemia: Secondary | ICD-10-CM

## 2016-03-19 DIAGNOSIS — Z7901 Long term (current) use of anticoagulants: Secondary | ICD-10-CM

## 2016-03-19 DIAGNOSIS — R52 Pain, unspecified: Secondary | ICD-10-CM

## 2016-03-19 DIAGNOSIS — K219 Gastro-esophageal reflux disease without esophagitis: Secondary | ICD-10-CM | POA: Diagnosis present

## 2016-03-19 DIAGNOSIS — T148XXA Other injury of unspecified body region, initial encounter: Secondary | ICD-10-CM

## 2016-03-19 DIAGNOSIS — R042 Hemoptysis: Secondary | ICD-10-CM

## 2016-03-19 DIAGNOSIS — K921 Melena: Secondary | ICD-10-CM

## 2016-03-19 DIAGNOSIS — Z954 Presence of other heart-valve replacement: Secondary | ICD-10-CM

## 2016-03-19 DIAGNOSIS — Z9889 Other specified postprocedural states: Secondary | ICD-10-CM

## 2016-03-19 DIAGNOSIS — E785 Hyperlipidemia, unspecified: Secondary | ICD-10-CM

## 2016-03-19 DIAGNOSIS — C3491 Malignant neoplasm of unspecified part of right bronchus or lung: Secondary | ICD-10-CM

## 2016-03-19 HISTORY — DX: Chronic atrial fibrillation, unspecified: I48.20

## 2016-03-19 LAB — CBC WITH DIFFERENTIAL/PLATELET
Basophils Absolute: 0 10*3/uL (ref 0.0–0.1)
Basophils Relative: 0 %
EOS ABS: 0 10*3/uL (ref 0.0–0.7)
EOS PCT: 0 %
HCT: 27.1 % — ABNORMAL LOW (ref 36.0–46.0)
Hemoglobin: 8.5 g/dL — ABNORMAL LOW (ref 12.0–15.0)
Lymphocytes Relative: 13 %
Lymphs Abs: 0.6 10*3/uL — ABNORMAL LOW (ref 0.7–4.0)
MCH: 31.8 pg (ref 26.0–34.0)
MCHC: 31.4 g/dL (ref 30.0–36.0)
MCV: 101.5 fL — ABNORMAL HIGH (ref 78.0–100.0)
Monocytes Absolute: 0.4 10*3/uL (ref 0.1–1.0)
Monocytes Relative: 9 %
Neutro Abs: 3.8 10*3/uL (ref 1.7–7.7)
Neutrophils Relative %: 78 %
PLATELETS: 170 10*3/uL (ref 150–400)
RBC: 2.67 MIL/uL — AB (ref 3.87–5.11)
RDW: 16.7 % — ABNORMAL HIGH (ref 11.5–15.5)
WBC: 4.8 10*3/uL (ref 4.0–10.5)

## 2016-03-19 LAB — COMPREHENSIVE METABOLIC PANEL
ALT: 85 U/L — AB (ref 14–54)
ANION GAP: 4 — AB (ref 5–15)
AST: 75 U/L — ABNORMAL HIGH (ref 15–41)
Albumin: 3.1 g/dL — ABNORMAL LOW (ref 3.5–5.0)
Alkaline Phosphatase: 57 U/L (ref 38–126)
BUN: 58 mg/dL — ABNORMAL HIGH (ref 6–20)
CALCIUM: 9.6 mg/dL (ref 8.9–10.3)
CHLORIDE: 100 mmol/L — AB (ref 101–111)
CO2: 37 mmol/L — ABNORMAL HIGH (ref 22–32)
CREATININE: 0.95 mg/dL (ref 0.44–1.00)
GFR, EST NON AFRICAN AMERICAN: 52 mL/min — AB (ref >60)
Glucose, Bld: 128 mg/dL — ABNORMAL HIGH (ref 65–99)
Potassium: 5.1 mmol/L (ref 3.5–5.1)
Sodium: 141 mmol/L (ref 135–145)
Total Bilirubin: 0.4 mg/dL (ref 0.3–1.2)
Total Protein: 5.1 g/dL — ABNORMAL LOW (ref 6.5–8.1)

## 2016-03-19 LAB — I-STAT TROPONIN, ED: Troponin i, poc: 0.03 ng/mL (ref 0.00–0.08)

## 2016-03-19 LAB — URINALYSIS, ROUTINE W REFLEX MICROSCOPIC
Bilirubin Urine: NEGATIVE
GLUCOSE, UA: NEGATIVE mg/dL
HGB URINE DIPSTICK: NEGATIVE
Ketones, ur: NEGATIVE mg/dL
Leukocytes, UA: NEGATIVE
Nitrite: NEGATIVE
Protein, ur: NEGATIVE mg/dL
SPECIFIC GRAVITY, URINE: 1.013 (ref 1.005–1.030)
pH: 5 (ref 5.0–8.0)

## 2016-03-19 LAB — LIPASE, BLOOD: LIPASE: 33 U/L (ref 11–51)

## 2016-03-19 LAB — PROTIME-INR
INR: 2.74
PROTHROMBIN TIME: 29.6 s — AB (ref 11.4–15.2)

## 2016-03-19 LAB — MRSA PCR SCREENING: MRSA BY PCR: NEGATIVE

## 2016-03-19 MED ORDER — SODIUM CHLORIDE 0.9 % IV BOLUS (SEPSIS)
500.0000 mL | Freq: Once | INTRAVENOUS | Status: AC
  Administered 2016-03-19: 500 mL via INTRAVENOUS

## 2016-03-19 MED ORDER — LEVOTHYROXINE SODIUM 88 MCG PO TABS
88.0000 ug | ORAL_TABLET | Freq: Every day | ORAL | Status: DC
  Administered 2016-03-20 – 2016-03-22 (×3): 88 ug via ORAL
  Filled 2016-03-19 (×3): qty 1

## 2016-03-19 MED ORDER — ONDANSETRON HCL 4 MG/2ML IJ SOLN
4.0000 mg | Freq: Four times a day (QID) | INTRAMUSCULAR | Status: DC | PRN
  Administered 2016-03-21: 4 mg via INTRAVENOUS
  Filled 2016-03-19: qty 2

## 2016-03-19 MED ORDER — ONDANSETRON HCL 4 MG PO TABS: 4.0000 mg | ORAL_TABLET | Freq: Four times a day (QID) | ORAL | Status: DC | PRN

## 2016-03-19 MED ORDER — IOPAMIDOL (ISOVUE-370) INJECTION 76%
100.0000 mL | Freq: Once | INTRAVENOUS | Status: AC | PRN
  Administered 2016-03-19: 100 mL via INTRAVENOUS

## 2016-03-19 MED ORDER — ZOLPIDEM TARTRATE 5 MG PO TABS: 5.0000 mg | ORAL_TABLET | Freq: Every day | ORAL | Status: DC

## 2016-03-19 MED ORDER — SIMVASTATIN 10 MG PO TABS
10.0000 mg | ORAL_TABLET | Freq: Every day | ORAL | Status: DC
  Administered 2016-03-19 – 2016-03-21 (×3): 10 mg via ORAL
  Filled 2016-03-19 (×3): qty 1

## 2016-03-19 MED ORDER — ACETAMINOPHEN 325 MG PO TABS: 650.0000 mg | ORAL_TABLET | Freq: Four times a day (QID) | ORAL | Status: DC | PRN

## 2016-03-19 MED ORDER — ALLOPURINOL 300 MG PO TABS
300.0000 mg | ORAL_TABLET | Freq: Every day | ORAL | Status: DC
  Administered 2016-03-19 – 2016-03-20 (×2): 300 mg via ORAL
  Filled 2016-03-19: qty 3
  Filled 2016-03-19: qty 1
  Filled 2016-03-19: qty 3
  Filled 2016-03-19: qty 1

## 2016-03-19 MED ORDER — IOPAMIDOL (ISOVUE-370) INJECTION 76%
INTRAVENOUS | Status: AC
  Filled 2016-03-19: qty 100

## 2016-03-19 MED ORDER — CARVEDILOL 12.5 MG PO TABS
12.5000 mg | ORAL_TABLET | Freq: Two times a day (BID) | ORAL | Status: DC
  Administered 2016-03-20 – 2016-03-22 (×4): 12.5 mg via ORAL
  Filled 2016-03-19 (×5): qty 1

## 2016-03-19 MED ORDER — CELECOXIB 200 MG PO CAPS
200.0000 mg | ORAL_CAPSULE | Freq: Every day | ORAL | Status: DC | PRN
  Filled 2016-03-19: qty 1

## 2016-03-19 MED ORDER — ORAL CARE MOUTH RINSE
15.0000 mL | Freq: Two times a day (BID) | OROMUCOSAL | Status: DC
  Administered 2016-03-19 – 2016-03-22 (×5): 15 mL via OROMUCOSAL

## 2016-03-19 MED ORDER — ACETAMINOPHEN 650 MG RE SUPP: 650.0000 mg | Freq: Four times a day (QID) | RECTAL | Status: DC | PRN

## 2016-03-19 MED ORDER — FUROSEMIDE 40 MG PO TABS: 40.0000 mg | ORAL_TABLET | Freq: Two times a day (BID) | ORAL | Status: DC

## 2016-03-19 MED ORDER — WARFARIN - PHARMACIST DOSING INPATIENT: Freq: Every day | Status: DC

## 2016-03-19 MED ORDER — DICYCLOMINE HCL 10 MG PO CAPS
10.0000 mg | ORAL_CAPSULE | Freq: Every day | ORAL | Status: DC | PRN
  Filled 2016-03-19: qty 1

## 2016-03-19 MED ORDER — TRAMADOL HCL 50 MG PO TABS: 50.0000 mg | ORAL_TABLET | Freq: Four times a day (QID) | ORAL | Status: DC | PRN

## 2016-03-19 MED ORDER — POTASSIUM CHLORIDE CRYS ER 10 MEQ PO TBCR: 10.0000 meq | EXTENDED_RELEASE_TABLET | Freq: Two times a day (BID) | ORAL | Status: DC | PRN

## 2016-03-19 MED ORDER — HYDROXYZINE HCL 25 MG PO TABS: 25.0000 mg | ORAL_TABLET | Freq: Three times a day (TID) | ORAL | Status: DC | PRN

## 2016-03-19 NOTE — ED Notes (Signed)
ED Provider at bedside. 

## 2016-03-19 NOTE — ED Notes (Signed)
Bed: WA18 Expected date:  Expected time:  Means of arrival:  Comments: EMS-SOB 

## 2016-03-19 NOTE — H&P (Addendum)
History and Physical    Deborah [name]:[id number] DOB: 1926/09/17 DOA: 03/19/2016    PCP: [name], MD  Patient coming from: home  Chief Complaint: shortness of breath  HPI: Deborah [name] is a 82 y.o. female with medical history significant of metastatic (T2b, N2, M1b) small cell lung cancer diagnosed in November 2017  S/p palliative radiation to right lung mass and currently forgoing chemo. She also has a h/o mechanical mitral valve and A-fib on Coumadin, AVMs and presumed chronic GI blood loss, Iron deficiency, CHF unspecified, HTN HYpothroid, Gout.   She had a blood transfusion a few days ago and has since been increasingly dyspneic at rest and with movement. No chest pain. Mild cough, non productive. Has chronic pedal edema. She is found to have a large pleural effusion. She last had a thoracentesis on 11/19.   ED Course: pulse ox 100% on 6 L O2, BUN/Cr ratio high at 58/0.95, Hb 8.5, INR 2.74   Review of Systems:  Easy bruising Pedal edema Poor appetite  All other systems reviewed and apart from HPI, are negative.  Past Medical History:  Diagnosis Date  . Acute CHF (congestive heart failure) ([place]) 01/10/2016  . Anemia   . Arthritis    "fingers, toes, hips; qwhere" (05/19/2013)  . Breast cancer ([place]) dx'd 1994   "right; S/P lumpectomy, chemo, XRT"   . Chronic lower GI bleeding   . Complication of anesthesia    "once I'm awakened, I don't sleep til the following night" BP drops very low had to stop putting in [place] -a- cath  . GERD (gastroesophageal reflux disease)   . Gout   . Heart murmur   . High cholesterol   . History of blood transfusion 1996; ~ 2013   "related to valve replacement; GI bleeding"   . History of breast cancer 02/16/2016  . History of echocardiogram    Echo 1/17: mild LVH, EF 50-55%, no RWMA, mild AI, mechanical AVR with mean 21 mmHg/peak 42 mmHg (stable since 2012), mod MR, mod to severe BAE, PASP 36 mmHg  . History of nuclear stress  test    Myoview 1/17: EF 59%, normal perfusion, low risk  . Hypertension   . Hypothyroidism   . Lung cancer ([place]) dx'd 01/2016   SCL  . On supplemental oxygen therapy   . Permanent atrial fibrillation ([place])       . Pneumonia 07/2011  . Rectus sheath hematoma 12/19/14    held anticoagulation for 1 week, transfused  . Small cell lung cancer, right ([place]) 02/16/2016  . Stomach problems    seeing Dr Watt Climes  . TIA (transient ischemic attack) 12/24/14   symptoms resolved    Past Surgical History:  Procedure Laterality Date  . ABDOMINAL HYSTERECTOMY    . AORTIC VALVE REPLACEMENT  1996   [employer]  . APPENDECTOMY    . arteriogram  01/12   NO RAS   . BREAST BIOPSY Right   . BREAST LUMPECTOMY Right   . CARDIAC CATHETERIZATION    . CARDIOVERSION     X 2  . CATARACT EXTRACTION W/ INTRAOCULAR LENS  IMPLANT, BILATERAL Bilateral    bilateral cataract with lens implants  . CHOLECYSTECTOMY    . COLONOSCOPY WITH PROPOFOL N/A 11/24/2012   Procedure: COLONOSCOPY WITH PROPOFOL;  Surgeon: Jeryl Columbia, MD;  Location: [facility] ENDOSCOPY;  Service: Endoscopy;  Laterality: N/A;  . ESOPHAGOGASTRODUODENOSCOPY (EGD) WITH PROPOFOL N/A 07/25/2015   Procedure: ESOPHAGOGASTRODUODENOSCOPY (EGD) WITH PROPOFOL;  Surgeon: Altamese Dilling  Magod, MD;  Location: [facility];  Service: Endoscopy;  Laterality: N/A;  . HOT HEMOSTASIS N/A 07/25/2015   Procedure: HOT HEMOSTASIS (ARGON PLASMA COAGULATION/BICAP);  Surgeon: Clarene Essex, MD;  Location: [facility] ENDOSCOPY;  Service: Endoscopy;  Laterality: N/A;  . VIDEO BRONCHOSCOPY WITH ENDOBRONCHIAL ULTRASOUND N/A 02/07/2016   Procedure: VIDEO BRONCHOSCOPY WITH ENDOBRONCHIAL ULTRASOUND;  Surgeon: Collene Gobble, MD;  Location: [place] [facility];  Service: Thoracic;  Laterality: N/A;    Social History:   reports that she quit smoking about 37 years ago. Her smoking use included Cigarettes. She has a 20.00 pack-year smoking history. She has never used smokeless tobacco. She reports that she drinks about 4.8 oz of  alcohol per week . She reports that she does not use drugs.  Lives alone, able to perform ALDs  Allergies  Allergen Reactions  . Alendronate Sodium Other (See Comments)    GI PAIN CAUTION: RISK FOR PERFORATION  . Amiodarone Shortness Of Breath  . Chlorthalidone Shortness Of Breath    ABDOMINAL BLOATING  . Macrodantin [Nitrofurantoin] Nausea And Vomiting and Other (See Comments)    CONTRAINDICATED WITH GFR < 50  . Meclizine Swelling    SWELLING OF TONGUE  . Norvasc [Amlodipine Besylate] Swelling    SWELLING REACTION UNSPECIFIED   . Hydralazine Hcl Nausea Only  . Sulfa Antibiotics Other (See Comments)    Unknown  . Tussionex Pennkinetic Er [Hydrocod Polst-Cpm Polst Er] Other (See Comments)    UNSPECIFIED REACTION     Family History  Problem Relation Age of Onset  . Dementia Mother   . Coronary artery disease Father   . Heart attack Father   . Breast cancer Daughter   . Colon cancer Sister   . Liver cancer Sister   . Lung cancer Brother   . COPD Brother      Prior to Admission medications   Medication Sig Start Date End Date Taking? Authorizing Provider  acetaminophen (TYLENOL) 500 MG tablet Take 1,000 mg by mouth every 6 (six) hours as needed for mild pain, moderate pain or headache. Reported on 09/26/2015   Yes Historical Provider, MD  allopurinol (ZYLOPRIM) 300 MG tablet Take 300 mg by mouth at bedtime.    Yes Historical Provider, MD  calcium-vitamin D 500 MG tablet Take 1 tablet by mouth 2 (two) times daily. Reported on 09/26/2015   Yes Historical Provider, MD  carvedilol (COREG) 12.5 MG tablet Take 1 tablet (12.5 mg total) by mouth 2 (two) times daily with a meal. 03/11/16  Yes Isaiah Serge, NP  celecoxib (CELEBREX) 200 MG capsule Take 200 mg by mouth daily as needed for moderate pain.  12/07/14  Yes Historical Provider, MD  dicyclomine (BENTYL) 10 MG capsule Take 10 mg by mouth daily as needed for spasms.    Yes Historical Provider, MD  fexofenadine (ALLEGRA ALLERGY) 180  MG tablet Take 180 mg by mouth daily as needed for allergies.    Yes Historical Provider, MD  furosemide (LASIX) 40 MG tablet Take 1-2 tablets daily as directed. Patient taking differently: Take 20-40 mg by mouth 2 (two) times daily as needed for fluid.  12/28/15  Yes Rhonda G Barrett, PA-C  hydrOXYzine (ATARAX/VISTARIL) 25 MG tablet Take 25 mg by mouth 3 (three) times daily as needed for anxiety.   Yes Historical Provider, MD  levothyroxine (SYNTHROID, LEVOTHROID) 88 MCG tablet Take 1 tablet (88 mcg total) by mouth daily before breakfast. 10/10/15  Yes Skeet Latch, MD  LORazepam (ATIVAN) 0.5 MG tablet Take 1 tablet (  0.5 mg total) by mouth every 8 (eight) hours as needed for anxiety. 02/16/16  Yes Curt Bears, MD  NITROSTAT 0.4 MG SL tablet Place 1 tablet (0.4 mg total) under the tongue every 5 (five) minutes as needed for chest pain (x 3 doses). Reported on 10/17/2015 02/15/16  Yes Skeet Latch, MD  OXYGEN Inhale 3 L into the lungs continuous.    Yes Historical Provider, MD  potassium chloride SA (K-DUR,KLOR-CON) 20 MEQ tablet Take 0.5 tablets (10 mEq total) by mouth 2 (two) times daily as needed (taking with Lasix). 02/07/16  Yes Collene Gobble, MD  Probiotic Product (PROBIOTIC ADVANCED PO) Take 1 tablet by mouth daily. Reported on 10/17/2015   Yes Historical Provider, MD  simvastatin (ZOCOR) 20 MG tablet Take 10 mg by mouth daily.    Yes Historical Provider, MD  traMADol (ULTRAM) 50 MG tablet Take 1 tablet (50 mg total) by mouth every 6 (six) hours as needed. Patient taking differently: Take 50 mg by mouth every 6 (six) hours as needed for moderate pain.  02/16/16  Yes Almyra Deforest, PA  valsartan (DIOVAN) 160 MG tablet Take 2 tablets (320 mg total) by mouth daily. 03/11/16  Yes Isaiah Serge, NP  warfarin (COUMADIN) 5 MG tablet Take 2.5-5 mg by mouth daily. Takes 5 mg everyday except 2.5 mg on Monday, Wednesday, and Friday   Yes Historical Provider, MD  zolpidem (AMBIEN) 5 MG tablet Take 5 mg  by mouth at bedtime.    Yes Historical Provider, MD  dexamethasone (DECADRON) 4 MG tablet Take 2 tablets (8 mg total) by mouth 2 (two) times daily with a meal. Patient not taking: Reported on 03/19/2016 02/09/16   [name], MD  enoxaparin (LOVENOX) 100 MG/ML injection Inject 0.5 mLs (50 mg total) into the skin every 12 (twelve) hours. Please dispense 2 week supply, zero refills Patient not taking: Reported on 03/19/2016 02/22/16   Donne Hazel, MD    Physical Exam: Vitals:   03/19/16 1515 03/19/16 1530 03/19/16 1539 03/19/16 1600  BP:  109/65 109/65 111/80  Pulse: 85 89 85 (!) 32  Resp: '21 25 20 20  '$ Temp:      TempSrc:      SpO2: 100% 100% 100% (!) 80%  Weight:      Height:          Constitutional: NAD, calm, comfortable Eyes: PERTLA, lids and conjunctivae normal ENMT: Mucous membranes are moist. Posterior pharynx clear of any exudate or lesions. Normal dentition.  Neck: normal, supple, no masses, no thyromegaly Respiratory: clear to auscultation bilaterally, no wheezing, no crackles. Normal respiratory effort. No accessory muscle use.  Cardiovascular: S1 & S2 heard, regular rate and rhythm, no murmurs / rubs / gallops. No extremity edema. 2+ pedal pulses. No carotid bruits.  Abdomen: No distension, no tenderness, no masses palpated. No hepatosplenomegaly. Bowel sounds normal.  Musculoskeletal: no clubbing / cyanosis. No joint deformity upper and lower extremities. Good ROM, no contractures. Normal muscle tone.  Skin: no rashes, lesions, ulcers. No induration Neurologic: CN 2-12 grossly intact. Sensation intact, DTR normal. Strength 5/5 in all 4 limbs.  Psychiatric: Normal judgment and insight. Alert and oriented x 3. Normal mood.     Labs on Admission: I have personally reviewed following labs and imaging studies  CBC:  Recent Labs Lab 03/13/16 1103 03/15/16 1131 03/18/16 1037 03/19/16 1107  WBC 2.8* 3.2* 3.6* 4.8  NEUTROABS 2.1 2.3 2.7 3.8  HGB 9.6* 8.4*  9.0* 8.5*  HCT 30.2*  26.8* 29.6* 27.1*  MCV 100.7 103.9* 103.9* 101.5*  PLT 131* 119* 177 332   Basic Metabolic Panel:  Recent Labs Lab 03/18/16 1037 03/19/16 1107  NA 141 141  K 4.8 5.1  CL  --  100*  CO2 37* 37*  GLUCOSE 108 128*  BUN 56.8* 58*  CREATININE 1.0 0.95  CALCIUM 9.9 9.6   GFR: Estimated Creatinine Clearance: 33.3 mL/min (by C-G formula based on SCr of 0.95 mg/dL). Liver Function Tests:  Recent Labs Lab 03/18/16 1037 03/19/16 1107  AST 14 75*  ALT 19 85*  ALKPHOS 58 57  BILITOT <0.22 0.4  PROT 5.4* 5.1*  ALBUMIN 2.8* 3.1*    Recent Labs Lab 03/19/16 1107  LIPASE 33   No results for input(s): AMMONIA in the last 168 hours. Coagulation Profile:  Recent Labs Lab 03/13/16 1103 03/14/16 1049 03/15/16 1130 03/18/16 1310 03/19/16 1107  INR 1.10* 1.40* 1.60* 4.30* 2.74  PROTIME 13.2 16.8* 19.2* 51.6*  --    Cardiac Enzymes: No results for input(s): CKTOTAL, CKMB, CKMBINDEX, TROPONINI in the last 168 hours. BNP (last 3 results) No results for input(s): PROBNP in the last 8760 hours. HbA1C: No results for input(s): HGBA1C in the last 72 hours. CBG: No results for input(s): GLUCAP in the last 168 hours. Lipid Profile: No results for input(s): CHOL, HDL, LDLCALC, TRIG, CHOLHDL, LDLDIRECT in the last 72 hours. Thyroid Function Tests: No results for input(s): TSH, T4TOTAL, FREET4, T3FREE, THYROIDAB in the last 72 hours. Anemia Panel: No results for input(s): VITAMINB12, FOLATE, FERRITIN, TIBC, IRON, RETICCTPCT in the last 72 hours. Urine analysis:    Component Value Date/Time   COLORURINE YELLOW 03/19/2016 1038   APPEARANCEUR CLEAR 03/19/2016 1038   LABSPEC 1.013 03/19/2016 1038   LABSPEC 1.010 03/22/2015 0917   PHURINE 5.0 03/19/2016 1038   GLUCOSEU NEGATIVE 03/19/2016 1038   GLUCOSEU Negative 03/22/2015 0917   HGBUR NEGATIVE 03/19/2016 1038   BILIRUBINUR NEGATIVE 03/19/2016 1038   BILIRUBINUR Negative 03/22/2015 0917   KETONESUR  NEGATIVE 03/19/2016 1038   PROTEINUR NEGATIVE 03/19/2016 1038   UROBILINOGEN 0.2 03/22/2015 0917   NITRITE NEGATIVE 03/19/2016 1038   LEUKOCYTESUR NEGATIVE 03/19/2016 1038   LEUKOCYTESUR Negative 03/22/2015 0917   Sepsis Labs: '@LABRCNTIP'$ (procalcitonin:4,lacticidven:4) )No results found for this or any previous visit (from the past 240 hour(s)).   Radiological Exams on Admission: Dg Chest 2 View  Result Date: 03/19/2016 CLINICAL DATA:  Increased shortness of breath EXAM: CHEST  2 VIEW COMPARISON:  03/18/2016 FINDINGS: There is moderate right pleural effusion. There is a left pleural effusion. There is no pneumothorax. There is no focal consolidation. There is stable cardiomediastinal silhouette. There is a persistent right hilar soft tissue mass. There is evidence of prior CABG. There is a Port-A-Cath with its tip projecting over expected duplicated left SVC. The osseous structures are unremarkable. IMPRESSION: 1. Persistent right hilar soft tissue mass. Moderate right pleural effusion. Electronically Signed   By: Kathreen Devoid   On: 03/19/2016 10:56   Dg Chest 2 View  Result Date: 03/18/2016 CLINICAL DATA:  History of lung cancer and breast cancer. EXAM: CHEST  2 VIEW COMPARISON:  03/07/2016. FINDINGS: PowerPort catheter in stable position with tip projected over the left mediastinum consistent left-sided SVC. Prior cardiac valve surgery. Cardiomegaly. Mild bilateral from interstitial prominence and pleural effusions consistent CHF. No pneumothorax. Surgical clips right axilla. IMPRESSION: 1. PowerPort catheter noted with tip projected over superior vena cava. 2. Prior cardiac valve surgery. Congestive heart failure with pulmonary interstitial edema  and small right pleural effusion . Electronically Signed   By: [place]   On: 03/18/2016 14:29   Ct Angio Chest Pe W And/or Wo Contrast  Result Date: 03/19/2016 CLINICAL DATA:  Shortness of breath. History of right breast cancer. EXAM: CT  ANGIOGRAPHY CHEST WITH CONTRAST TECHNIQUE: Multidetector CT imaging of the chest was performed using the standard protocol during bolus administration of intravenous contrast. Multiplanar CT image reconstructions and MIPs were obtained to evaluate the vascular anatomy. CONTRAST:  100 mL of Isovue 370 intravenously. COMPARISON:  CT scan of February 18, 2016. FINDINGS: Cardiovascular: 5.5 cm ascending thoracic aortic aneurysm is noted. Aortic atherosclerosis is noted. No dissection is noted. There is no evidence of large central pulmonary embolus. However, due to respiratory motion artifact, smaller peripheral pulmonary emboli in lower branches cannot be excluded on the basis of this exam. Left internal jugular Port-A-Cath is again noted with distal tip in excess tree azygos vein on the left which potentially flows into right atrium. Enlargement of pulmonary arteries is noted suggesting pulmonary artery hypertension. Mediastinum/Nodes: Right peritracheal adenopathy is noted with largest lymph node measuring 20 x 15 mm. Lungs/Pleura: No pneumothorax is noted. Left lung is unremarkable. Large right pleural effusion is noted with complete atelectasis of right lower lobe. Right upper lobe opacity is noted concerning for pneumonia or atelectasis. Upper Abdomen: Multiple left renal cysts are noted. 2.8 cm left adrenal mass is noted concerning for metastatic disease. Musculoskeletal: No chest wall abnormality. No acute or significant osseous findings. Review of the MIP images confirms the above findings. IMPRESSION: 5.5 cm ascending thoracic aortic aneurysm. Recommend semi-annual imaging followup by CTA or MRA and referral to cardiothoracic surgery if not already obtained. This recommendation follows 2010 ACCF/AHA/AATS/ACR/ASA/SCA/SCAI/SIR/STS/SVM Guidelines for the Diagnosis and Management of Patients With Thoracic Aortic Disease. Circulation. 2010; 121: [id number]. No evidence of large central pulmonary embolus. However,  due to artifact related to respiratory motion, smaller peripheral pulmonary emboli cannot be excluded on the basis of this exam. Enlargement of pulmonary arteries is noted suggesting pulmonary artery hypertension. Right peritracheal adenopathy is noted consistent with metastatic disease. 2.8 cm left adrenal mass is noted concerning for metastatic disease. Electronically Signed   By: Marijo Conception, M.D.   On: 03/19/2016 13:37    EKG: Independently reviewed. A-fib at 93 bpm  Assessment/Plan Principal Problem:   Acute respiratory failure with hypoxia,   Pleural effusion on right - apparently progressed over the past 2 days - CXR on 12/18 showed it to be a small effusion - no certain if this is fluid overload in relation to the recent transfusion but as she has cancer on the same side, it is also malignant - this would be her second thoracentesis thus far  - as she is on 6 L O2 and dyspneic with movement, will send to SDU - ADDENDUM: have spoken with IR and arranged thoracentesis to be performed now. Per Dr Pascal Lux, no need to hold Coumadin. Have discussed with daughter. No need to send sample to lab as this is likely cancerous or fluid overload from PRBC  Active Problems: Small cell lung cancer, right  - palliative radiation complete- is not wanting chemo - being managed by Dr Jana Hakim    S/P aortic valve replacement with metallic valve - holding coumadin as mentioned    Chronic atrial fibrillation - cont Coreg for rate control- hold Coumadin  CHF- dehydration - mild hypotension - ? dCHF- on Lasix possibly for this also has mod pulm HTN  and mod TR - actually appears dehydrated per physical exam and BUN/ Cr ratio- hold lasix today  Venous stasis vs right heart failure - advised to elevated legs at home, TEDS if needed-     Iron deficiency anemia due to chronic blood loss - received Iron infusions and blood transfusion    Hypotension with h/o Essential hypertension - BP on low end- cont  only Coreg for now for rate control    Gout Allopurinol    Hypothyroidism - synthroid       DVT prophylaxis: Coumadin  Code Status: DNR  Family Communication: daughter  Disposition Plan: home when stable  Consults called:   Admission status: inpatient    [facility] MD [facility] Pager: [email or web address] Password [email or web address] [phone], please contact night-coverage   03/19/2016, 4:16 PM

## 2016-03-19 NOTE — ED Triage Notes (Signed)
Per EMS, patient reports labored breathing, pale coloring, cool to touch, new onset bilateral pitting edema in lower extremities. Patient was 78% on 2 L [place] (patient normally on 2 L at home). 6 L [place], patient is 98%. Per EMS, lung sounds diminished. Patient had xray yesterday that showed fluid on lungs. Patient had blood transfusion on Friday. Mass on right lung that is being treated with radiation. Hx of CHF. Patient is from [facility].

## 2016-03-19 NOTE — Progress Notes (Signed)
ANTICOAGULATION CONSULT NOTE - Initial Consult  Pharmacy Consult for Warfarin Indication: mechanical AVR  Allergies  Allergen Reactions  . Alendronate Sodium Other (See Comments)    GI PAIN CAUTION: RISK FOR PERFORATION  . Amiodarone Shortness Of Breath  . Chlorthalidone Shortness Of Breath    ABDOMINAL BLOATING  . Macrodantin [Nitrofurantoin] Nausea And Vomiting and Other (See Comments)    CONTRAINDICATED WITH GFR < 50  . Meclizine Swelling    SWELLING OF TONGUE  . Norvasc [Amlodipine Besylate] Swelling    SWELLING REACTION UNSPECIFIED   . Hydralazine Hcl Nausea Only  . Sulfa Antibiotics Other (See Comments)    Unknown  . Tussionex Pennkinetic Er [Hydrocod Polst-Cpm Polst Er] Other (See Comments)    UNSPECIFIED REACTION     Patient Measurements: Height: 5' (152.4 cm) Weight: 128 lb 8.5 oz (58.3 kg) IBW/kg (Calculated) : 45.5  Vital Signs: Temp: 98.6 F (37 C) (12/19 1625) Temp Source: Oral (12/19 1625) BP: 103/41 (12/19 1749) Pulse Rate: 32 (12/19 1600)  Labs:  Recent Labs  03/18/16 1037 03/18/16 1037 03/18/16 1310 03/19/16 1107  HGB 9.0*  --   --  8.5*  HCT 29.6*  --   --  27.1*  PLT 177  --   --  170  LABPROT  --   --   --  29.6*  INR  --   --  4.30* 2.74  CREATININE  --  1.0  --  0.95    Estimated Creatinine Clearance: 33.3 mL/min (by C-G formula based on SCr of 0.95 mg/dL).   Medical History: Past Medical History:  Diagnosis Date  . Acute CHF (congestive heart failure) ([place]) 01/10/2016  . Anemia   . Arthritis    "fingers, toes, hips; qwhere" (05/19/2013)  . Breast cancer ([place]) dx'd 1994   "right; S/P lumpectomy, chemo, XRT"   . Chronic atrial fibrillation ([place]) 09/06/2011  . Chronic lower GI bleeding   . Complication of anesthesia    "once I'm awakened, I don't sleep til the following night" BP drops very low had to stop putting in [place] -a- cath  . GERD (gastroesophageal reflux disease)   . Gout   . Heart murmur   . High cholesterol   .  History of blood transfusion 1996; ~ 2013   "related to valve replacement; GI bleeding"   . History of breast cancer 02/16/2016  . History of echocardiogram    Echo 1/17: mild LVH, EF 50-55%, no RWMA, mild AI, mechanical AVR with mean 21 mmHg/peak 42 mmHg (stable since 2012), mod MR, mod to severe BAE, PASP 36 mmHg  . History of nuclear stress test    Myoview 1/17: EF 59%, normal perfusion, low risk  . Hypertension   . Hypothyroidism   . Lung cancer ([place]) dx'd 01/2016   SCL  . On supplemental oxygen therapy   . Permanent atrial fibrillation ([place])       . Pneumonia 07/2011  . Rectus sheath hematoma 12/19/14    held anticoagulation for 1 week, transfused  . Small cell lung cancer, right ([place]) 02/16/2016  . Stomach problems    seeing Dr Watt Climes  . TIA (transient ischemic attack) 12/24/14   symptoms resolved    Medications:  Scheduled:  . allopurinol  300 mg Oral QHS  . carvedilol  12.5 mg Oral BID WC  . iopamidol      . [START ON 03/20/2016] levothyroxine  88 mcg Oral QAC breakfast  . simvastatin  10 mg Oral Daily  . [START  ON 03/20/2016] Warfarin - Pharmacist Dosing Inpatient   Does not apply q1800  . zolpidem  5 mg Oral QHS   Infusions:    Assessment: 41 yoF admitted on 12/19 with SOB and continued black stools from known GIB and multiple AVMs.  She continues to be chronically anticoagulated due to her mechanical AVR, afib, stroke, and lung cancer per Dr. Jana Hakim.  She was temporarily on Lovenox, but recently changed her back to Warfarin starting on 03/11/16.  Her home warfarin dose was reduced to '5mg'$  daily except 2.'5mg'$  on MWF starting on 03/18/16.  Last warfarin dose taken on 12/18 at 1600.  Today, 03/19/2016:  S/p thoracentesis of recurrent right pleural effusion yielding 1.3 L of fluid.  INR 2.74, supratherapeutic but decreased from INR 4.3 on 12/18  CBC: Hgb 8.5, Plt 170  Noted hx of anemia of chronic disease.  Last PRBC transfusion on 12/15 and last feraheme infusion  on 03/15/16.  Diet: regular   Goal of Therapy:  INR goal is 2-2.5 (cardiology note 03/11/16) Monitor platelets by anticoagulation protocol: Yes   Plan:   No further warfarin dose tonight  Note lower INR goal  Daily PT/INR.  Monitor for signs and symptoms of bleeding.   Gretta Arab PharmD, BCPS Pager [phone] 03/19/2016 6:05 PM

## 2016-03-19 NOTE — ED Provider Notes (Signed)
Emergency Department Provider Note   I have reviewed the triage vital signs and the nursing notes.   HISTORY  Chief Complaint Shortness of Breath   HPI [name] is a 82 y.o. female with PMH of CHF, GERD, HLD, s/p valve replacement on Warfarin with recurrent GI bleeding, and [name] who presents to the emergency department for evaluation of hypoxemia, dyspnea, shakiness, fatigue. The patient reports feeling short of breath and somewhat volume overloaded since blood transfusion was given earlier this month for GI bleed. The patient has had to continue anticoagulation because of a valve replacement. She also has atrophic relation. The patient's son at bedside states that he went to check her this morning and saw that her oxygen levels were in the high 70s to low 80% on her home oxygen of 2 L. No fevers or chills. No productive cough. The patient continues to have black stools and has noticed bilateral lower extremity pitting edema that seems to be worsening. She has been mostly compliant with her Lasix 40 mg by mouth twice a day dosing but did miss yesterday's dose. No associated chest pain.    Past Medical History:  Diagnosis Date  . Acute CHF (congestive heart failure) ([place]) 01/10/2016  . Anemia   . Arthritis    "fingers, toes, hips; qwhere" (05/19/2013)  . Breast cancer ([place]) dx'd 1994   "right; S/P lumpectomy, chemo, XRT"   . Chronic lower GI bleeding   . Complication of anesthesia    "once I'm awakened, I don't sleep til the following night" BP drops very low had to stop putting in [place] -a- cath  . GERD (gastroesophageal reflux disease)   . Gout   . Heart murmur   . High cholesterol   . History of blood transfusion 1996; ~ 2013   "related to valve replacement; GI bleeding"   . History of breast cancer 02/16/2016  . History of echocardiogram    Echo 1/17: mild LVH, EF 50-55%, no RWMA, mild AI, mechanical AVR with mean 21 mmHg/peak 42 mmHg (stable since 2012), mod MR, mod to  severe BAE, PASP 36 mmHg  . History of nuclear stress test    Myoview 1/17: EF 59%, normal perfusion, low risk  . Hypertension   . Hypothyroidism   . Lung cancer ([place]) dx'd 01/2016   SCL  . On supplemental oxygen therapy   . Permanent atrial fibrillation ([place])       . Pneumonia 07/2011  . Rectus sheath hematoma 12/19/14    held anticoagulation for 1 week, transfused  . Small cell lung cancer, right ([place]) 02/16/2016  . Stomach problems    seeing Dr Watt Climes  . TIA (transient ischemic attack) 12/24/14   symptoms resolved    Patient Active Problem List   Diagnosis Date Noted  . Acute respiratory failure with hypoxia ([place]) 03/19/2016  . Dyspnea 02/18/2016  . Malignant neoplasm of hilus of right lung ([place]) 02/18/2016  . Small cell lung cancer, right ([place]) 02/16/2016  . Chest pain 02/16/2016  . History of breast cancer 02/16/2016  . Hemoptysis   . Mediastinal lymphadenopathy   . Lung cancer, primary, with metastasis from lung to other site, right ([place]) 02/06/2016  . Ascending aortic aneurysm ([place]) 01/24/2016  . Pleural effusion on right 01/11/2016  . AVM (arteriovenous malformation) 01/11/2016  . S/P thoracentesis   . Shortness of breath 01/10/2016  . Acute CHF (congestive heart failure) ([place]) 01/10/2016  . Lung mass   . Chronic GI bleeding 12/27/2014  .  Cerebral thrombosis with cerebral infarction ([place]) 12/26/2014  . TIA (transient ischemic attack)   . Dysarthria 12/25/2014  . HLD (hyperlipidemia) 12/25/2014  . Essential hypertension 12/25/2014  . Gout 12/25/2014  . GERD (gastroesophageal reflux disease) 12/25/2014  . Normocytic anemia due to blood loss   . Rectus sheath hematoma 12/19/2014  . Acute blood loss anemia 12/19/2014  . Hematoma 12/19/2014  . Pain 06/14/2014  . Poor venous access   . Melena 05/19/2013  . Coagulopathy ([place]) 01/27/2013  . Iron deficiency anemia due to chronic blood loss 07/24/2012  . Malaise and fatigue 05/21/2012  . Dyslipidemia 12/04/2011  .  Chronic atrial fibrillation ([place]) 09/06/2011  . Araceli Arango term current use of anticoagulant therapy 08/08/2011  . Nonsustained ventricular tachycardia ([place]) 07/21/2011  . Hypokalemia 07/20/2011  . Hyponatremia 07/20/2011  . Bronchitis 07/20/2011  . S/P aortic valve replacement with metallic valve 63/87/5643  . Hypothyroidism   . Benign hypertensive heart disease without heart failure     Past Surgical History:  Procedure Laterality Date  . ABDOMINAL HYSTERECTOMY    . AORTIC VALVE REPLACEMENT  1996   [employer]  . APPENDECTOMY    . arteriogram  01/12   NO RAS   . BREAST BIOPSY Right   . BREAST LUMPECTOMY Right   . CARDIAC CATHETERIZATION    . CARDIOVERSION     X 2  . CATARACT EXTRACTION W/ INTRAOCULAR LENS  IMPLANT, BILATERAL Bilateral    bilateral cataract with lens implants  . CHOLECYSTECTOMY    . COLONOSCOPY WITH PROPOFOL N/A 11/24/2012   Procedure: COLONOSCOPY WITH PROPOFOL;  Surgeon: Jeryl Columbia, MD;  Location: [facility] ENDOSCOPY;  Service: Endoscopy;  Laterality: N/A;  . ESOPHAGOGASTRODUODENOSCOPY (EGD) WITH PROPOFOL N/A 07/25/2015   Procedure: ESOPHAGOGASTRODUODENOSCOPY (EGD) WITH PROPOFOL;  Surgeon: Clarene Essex, MD;  Location: [facility] ENDOSCOPY;  Service: Endoscopy;  Laterality: N/A;  . HOT HEMOSTASIS N/A 07/25/2015   Procedure: HOT HEMOSTASIS (ARGON PLASMA COAGULATION/BICAP);  Surgeon: Clarene Essex, MD;  Location: [facility] ENDOSCOPY;  Service: Endoscopy;  Laterality: N/A;  . VIDEO BRONCHOSCOPY WITH ENDOBRONCHIAL ULTRASOUND N/A 02/07/2016   Procedure: VIDEO BRONCHOSCOPY WITH ENDOBRONCHIAL ULTRASOUND;  Surgeon: Collene Gobble, MD;  Location: [facility] OR;  Service: Thoracic;  Laterality: N/A;      Allergies Alendronate sodium; Amiodarone; Chlorthalidone; Macrodantin [nitrofurantoin]; Meclizine; Norvasc [amlodipine besylate]; Hydralazine hcl; Sulfa antibiotics; and Tussionex pennkinetic er [hydrocod polst-cpm polst er]  Family History  Problem Relation Age of Onset  . Dementia Mother   . Coronary artery  disease Father   . Heart attack Father   . Breast cancer Daughter   . Colon cancer Sister   . Liver cancer Sister   . Lung cancer Brother   . COPD Brother     Social History Social History  Substance Use Topics  . Smoking status: Former Smoker    Packs/day: 1.00    Years: 20.00    Types: Cigarettes    Quit date: 04/01/1978  . Smokeless tobacco: Never Used  . Alcohol use 4.8 oz/week    4 Glasses of wine, 4 Shots of liquor per week     Comment: burbon or glass wine daily    Review of Systems  Constitutional: No fever/chills Eyes: No visual changes. ENT: No sore throat. Cardiovascular: Denies chest pain. Respiratory: Positive shortness of breath. Gastrointestinal: No abdominal pain. No nausea, no vomiting.  No diarrhea.  No constipation. Positive black stools.  Genitourinary: Negative for dysuria. Musculoskeletal: Negative for back pain. Positive LE edema.  Skin: Negative for rash.  Neurological: Negative for headaches, focal weakness or numbness.  10-point ROS otherwise negative.  ____________________________________________   PHYSICAL EXAM:  VITAL SIGNS: ED Triage Vitals  Enc Vitals Group     BP 03/19/16 1020 94/60     Pulse Rate 03/19/16 1020 92     Resp 03/19/16 1020 (!) 33     Temp 03/19/16 1020 97.8 F (36.6 C)     Temp Source 03/19/16 1020 Oral     SpO2 03/19/16 1012 (!) 78 %     Weight 03/19/16 1023 128 lb (58.1 kg)     Height 03/19/16 1023 5' (1.524 m)     Pain Score 03/19/16 1023 0   Constitutional: Alert and oriented. Increased respiratory rate but able to provide history.  Eyes: Conjunctivae are normal.  Head: Atraumatic. Nose: No congestion/rhinnorhea. Mouth/Throat: Mucous membranes are moist.  Oropharynx non-erythematous. Neck: No stridor. Cardiovascular: Normal rate, regular rhythm. Good peripheral circulation. Grossly normal heart sounds.   Respiratory: Increased respiratory effort.  No retractions. Lungs with rales at the bases.    Gastrointestinal: Soft and nontender. No distention.  Musculoskeletal: No lower extremity tenderness. 2+ pitting edema. No gross deformities of extremities. Neurologic:  Normal speech and language. No gross focal neurologic deficits are appreciated.  Skin:  Skin is warm, dry and intact. No rash noted.  ____________________________________________   LABS (all labs ordered are listed, but only abnormal results are displayed)  Labs Reviewed  COMPREHENSIVE METABOLIC PANEL - Abnormal; Notable for the following:       Result Value   Chloride 100 (*)    CO2 37 (*)    Glucose, Bld 128 (*)    BUN 58 (*)    Total Protein 5.1 (*)    Albumin 3.1 (*)    AST 75 (*)    ALT 85 (*)    GFR calc non Af Amer 52 (*)    Anion gap 4 (*)    All other components within normal limits  CBC WITH DIFFERENTIAL/PLATELET - Abnormal; Notable for the following:    RBC 2.67 (*)    Hemoglobin 8.5 (*)    HCT 27.1 (*)    MCV 101.5 (*)    RDW 16.7 (*)    Lymphs Abs 0.6 (*)    All other components within normal limits  PROTIME-INR - Abnormal; Notable for the following:    Prothrombin Time 29.6 (*)    All other components within normal limits  MRSA PCR SCREENING  LIPASE, BLOOD  URINALYSIS, ROUTINE W REFLEX MICROSCOPIC  I-STAT TROPOININ, ED  TYPE AND SCREEN   ____________________________________________  EKG   EKG Interpretation  Date/Time:  Tuesday March 19 2016 10:22:16 EST Ventricular Rate:  92 PR Interval:    QRS Duration: 101 QT Interval:  353 QTC Calculation: 437 R Axis:   38 Text Interpretation:  Atrial fibrillation Anterior infarct, old Repol abnrm suggests ischemia, lateral leads No STEMI. Similar to prior tracing.  Confirmed by Irelynd Zumstein MD, Kella Splinter [phone]) on 03/19/2016 10:45:11 AM       ____________________________________________  RADIOLOGY  Dg Chest 2 View  Result Date: 03/19/2016 CLINICAL DATA:  Increased shortness of breath EXAM: CHEST  2 VIEW COMPARISON:  03/18/2016 FINDINGS:  There is moderate right pleural effusion. There is a left pleural effusion. There is no pneumothorax. There is no focal consolidation. There is stable cardiomediastinal silhouette. There is a persistent right hilar soft tissue mass. There is evidence of prior CABG. There is a Port-A-Cath with its tip projecting over expected duplicated left  SVC. The osseous structures are unremarkable. IMPRESSION: 1. Persistent right hilar soft tissue mass. Moderate right pleural effusion. Electronically Signed   By: Kathreen Devoid   On: 03/19/2016 10:56   Ct Angio Chest Pe W And/or Wo Contrast  Result Date: 03/19/2016 CLINICAL DATA:  Shortness of breath. History of right breast cancer. EXAM: CT ANGIOGRAPHY CHEST WITH CONTRAST TECHNIQUE: Multidetector CT imaging of the chest was performed using the standard protocol during bolus administration of intravenous contrast. Multiplanar CT image reconstructions and MIPs were obtained to evaluate the vascular anatomy. CONTRAST:  100 mL of Isovue 370 intravenously. COMPARISON:  CT scan of February 18, 2016. FINDINGS: Cardiovascular: 5.5 cm ascending thoracic aortic aneurysm is noted. Aortic atherosclerosis is noted. No dissection is noted. There is no evidence of large central pulmonary embolus. However, due to respiratory motion artifact, smaller peripheral pulmonary emboli in lower branches cannot be excluded on the basis of this exam. Left internal jugular Port-A-Cath is again noted with distal tip in excess tree azygos vein on the left which potentially flows into right atrium. Enlargement of pulmonary arteries is noted suggesting pulmonary artery hypertension. Mediastinum/Nodes: Right peritracheal adenopathy is noted with largest lymph node measuring 20 x 15 mm. Lungs/Pleura: No pneumothorax is noted. Left lung is unremarkable. Large right pleural effusion is noted with complete atelectasis of right lower lobe. Right upper lobe opacity is noted concerning for pneumonia or  atelectasis. Upper Abdomen: Multiple left renal cysts are noted. 2.8 cm left adrenal mass is noted concerning for metastatic disease. Musculoskeletal: No chest wall abnormality. No acute or significant osseous findings. Review of the MIP images confirms the above findings. IMPRESSION: 5.5 cm ascending thoracic aortic aneurysm. Recommend semi-annual imaging followup by CTA or MRA and referral to cardiothoracic surgery if not already obtained. This recommendation follows 2010 ACCF/AHA/AATS/ACR/ASA/SCA/SCAI/SIR/STS/SVM Guidelines for the Diagnosis and Management of Patients With Thoracic Aortic Disease. Circulation. 2010; 121: [id number]. No evidence of large central pulmonary embolus. However, due to artifact related to respiratory motion, smaller peripheral pulmonary emboli cannot be excluded on the basis of this exam. Enlargement of pulmonary arteries is noted suggesting pulmonary artery hypertension. Right peritracheal adenopathy is noted consistent with metastatic disease. 2.8 cm left adrenal mass is noted concerning for metastatic disease. Electronically Signed   By: Marijo Conception, M.D.   On: 03/19/2016 13:37    ____________________________________________   PROCEDURES  Procedure(s) performed:   Procedures  CRITICAL CARE Performed by: Margette Fast Total critical care time: 35 minutes Critical care time was exclusive of separately billable procedures and treating other patients. Critical care was necessary to treat or prevent imminent or life-threatening deterioration. Critical care was time spent personally by me on the following activities: development of treatment plan with patient and/or surrogate as well as nursing, discussions with consultants, evaluation of patient's response to treatment, examination of patient, obtaining history from patient or surrogate, ordering and performing treatments and interventions, ordering and review of laboratory studies, ordering and review of  radiographic studies, pulse oximetry and re-evaluation of patient's condition.  Nanda Quinton, MD Emergency Medicine  ____________________________________________   INITIAL IMPRESSION / ASSESSMENT AND PLAN / ED COURSE  Pertinent labs & imaging results that were available during my care of the patient were reviewed by me and considered in my medical decision making (see chart for details).  Patient with multiple medical comorbidities presents emergency department for evaluation of hypoxemia, dyspnea, continued black stools, and lower extremity edema. She has diagnosis of congestive heart failure and requires blood transfusions  with recurrent GI bleeding. She is unable to stop her anticoagulation because of valve replacement. Patient is awake and alert but is breathing rapidly. She has rales at her bases bilaterally. Suspect some component of volume overload but will also evaluate her blood counts given her continued GI bleeding and inability to stop her anticoagulation.   Spoke with Dr. Jana Hakim, the patient's oncologist who reports chronic GI bleeding and that she frequently has fatigue with Hb < 9.0. This may be contributing to fatigue. She has known, on-going GI bleeding with multiple AVMs. Family is refusing rectal exam at this time.   Spoke with hosptialist and ICU physician on call regarding admission and drainage of right pleural effusion. With elevated INR 2/2 anticoagulation for artificial valve the patient will require drainage from [place]. Updated family regarding CT results and plan for admission and drainage of effusion +/- blood transfusion.   Discussed patient's case with Hospitalist, Dr. Wynelle Cleveland.  Recommend admission to stepdown, inpatient bed.  I will place holding orders per their request. Patient and family (if present) updated with plan. Care transferred to hospitalist service.  I reviewed all nursing notes, vitals, pertinent old records, EKGs, labs, imaging (as  available).  ____________________________________________  FINAL CLINICAL IMPRESSION(S) / ED DIAGNOSES  Final diagnoses:  Shortness of breath  Hypoxemia     MEDICATIONS GIVEN DURING THIS VISIT:  Medications  iopamidol (ISOVUE-370) 76 % injection (not administered)  allopurinol (ZYLOPRIM) tablet 300 mg (not administered)  dicyclomine (BENTYL) capsule 10 mg (not administered)  furosemide (LASIX) tablet 40 mg (not administered)  levothyroxine (SYNTHROID, LEVOTHROID) tablet 88 mcg (not administered)  potassium chloride (K-DUR,KLOR-CON) CR tablet 10 mEq (not administered)  simvastatin (ZOCOR) tablet 10 mg (not administered)  iopamidol (ISOVUE-370) 76 % injection 100 mL (100 mLs Intravenous Contrast Given 03/19/16 1306)     NEW OUTPATIENT MEDICATIONS STARTED DURING THIS VISIT:  None   Note:  This document was prepared using Dragon voice recognition software and may include unintentional dictation errors.  Nanda Quinton, MD Emergency Medicine   Margette Fast, MD 03/19/16 [phone]

## 2016-03-19 NOTE — ED Notes (Signed)
Hospitalist at bedside 

## 2016-03-19 NOTE — Progress Notes (Signed)
Courtesy note:  Deborah [name] ("[name]") is much more comfortable after she had 1.3 L pleural fluid removed. She felt worse after her recent transfusion and at this point accepting a lower Hb may be prudent.  She and the family have decided to enroll in Hospice after Christmas. We are getting that operationalized. I will be the hospice-ass MD as outpatient.  I wonder if placing a pleurx now would help [place] get through the next few weeks (if we have that long) without having to transport back to the hospital fr repeated thoracentesis  Greatly appreciate your help to this patient!

## 2016-03-19 NOTE — Procedures (Signed)
Ultrasound-guided  therapeutic right thoracentesis performed yielding 1.3 liters of light yellow  fluid. No immediate complications. Follow-up chest x-ray pending.

## 2016-03-19 NOTE — ED Notes (Signed)
Patient transported to CT 

## 2016-03-20 ENCOUNTER — Other Ambulatory Visit: Payer: Medicare Other

## 2016-03-20 ENCOUNTER — Telehealth: Payer: Self-pay | Admitting: Emergency Medicine

## 2016-03-20 ENCOUNTER — Inpatient Hospital Stay (HOSPITAL_COMMUNITY): Payer: Medicare Other

## 2016-03-20 DIAGNOSIS — R0902 Hypoxemia: Secondary | ICD-10-CM

## 2016-03-20 LAB — CBC
HCT: 28.1 % — ABNORMAL LOW (ref 36.0–46.0)
HEMOGLOBIN: 8.7 g/dL — AB (ref 12.0–15.0)
MCH: 33.1 pg (ref 26.0–34.0)
MCHC: 31 g/dL (ref 30.0–36.0)
MCV: 106.8 fL — ABNORMAL HIGH (ref 78.0–100.0)
Platelets: 227 10*3/uL (ref 150–400)
RBC: 2.63 MIL/uL — ABNORMAL LOW (ref 3.87–5.11)
RDW: 17.6 % — ABNORMAL HIGH (ref 11.5–15.5)
WBC: 6.6 10*3/uL (ref 4.0–10.5)

## 2016-03-20 LAB — BASIC METABOLIC PANEL
Anion gap: 3 — ABNORMAL LOW (ref 5–15)
BUN: 56 mg/dL — AB (ref 6–20)
CALCIUM: 9.5 mg/dL (ref 8.9–10.3)
CO2: 35 mmol/L — AB (ref 22–32)
CREATININE: 1.04 mg/dL — AB (ref 0.44–1.00)
Chloride: 99 mmol/L — ABNORMAL LOW (ref 101–111)
GFR calc Af Amer: 54 mL/min — ABNORMAL LOW (ref >60)
GFR, EST NON AFRICAN AMERICAN: 47 mL/min — AB (ref >60)
GLUCOSE: 117 mg/dL — AB (ref 65–99)
Potassium: 4.9 mmol/L (ref 3.5–5.1)
Sodium: 137 mmol/L (ref 135–145)

## 2016-03-20 LAB — PROTIME-INR
INR: 2.38
PROTHROMBIN TIME: 26.5 s — AB (ref 11.4–15.2)

## 2016-03-20 MED ORDER — NON FORMULARY: 12.5000 mg | Freq: Every day | Status: DC | PRN

## 2016-03-20 MED ORDER — ZOLPIDEM TARTRATE ER 12.5 MG PO TBCR
12.5000 mg | EXTENDED_RELEASE_TABLET | Freq: Every evening | ORAL | Status: DC | PRN
  Administered 2016-03-20: 12.5 mg via ORAL

## 2016-03-20 NOTE — Progress Notes (Signed)
ANTICOAGULATION CONSULT NOTE - Initial Consult  Pharmacy Consult for Warfarin--> heparin when INR <2 Indication: mechanical AVR and afib; holding warfarin for possible pleurx  Allergies  Allergen Reactions  . Alendronate Sodium Other (See Comments)    GI PAIN CAUTION: RISK FOR PERFORATION  . Amiodarone Shortness Of Breath  . Chlorthalidone Shortness Of Breath    ABDOMINAL BLOATING  . Macrodantin [Nitrofurantoin] Nausea And Vomiting and Other (See Comments)    CONTRAINDICATED WITH GFR < 50  . Meclizine Swelling    SWELLING OF TONGUE  . Norvasc [Amlodipine Besylate] Swelling    SWELLING REACTION UNSPECIFIED   . Hydralazine Hcl Nausea Only  . Sulfa Antibiotics Other (See Comments)    Unknown  . Tussionex Pennkinetic Er [Hydrocod Polst-Cpm Polst Er] Other (See Comments)    UNSPECIFIED REACTION     Patient Measurements: Height: 5' (152.4 cm) Weight: 128 lb 8.5 oz (58.3 kg) IBW/kg (Calculated) : 45.5  Vital Signs: Temp: 98.4 F (36.9 C) (12/20 0436) Temp Source: Axillary (12/20 0436) BP: 90/37 (12/20 0600)  Labs:  Recent Labs  03/18/16 1037 03/18/16 1037 03/18/16 1310 03/19/16 1107 03/20/16 0402  HGB 9.0*  --   --  8.5* 8.7*  HCT 29.6*  --   --  27.1* 28.1*  PLT 177  --   --  170 227  LABPROT  --   --   --  29.6* 26.5*  INR  --   --  4.30* 2.74 2.38  CREATININE  --  1.0  --  0.95 1.04*    Estimated Creatinine Clearance: 30.4 mL/min (by C-G formula based on SCr of 1.04 mg/dL (H)).   Medical History: Past Medical History:  Diagnosis Date  . Acute CHF (congestive heart failure) ([place]) 01/10/2016  . Anemia   . Arthritis    "fingers, toes, hips; qwhere" (05/19/2013)  . Breast cancer ([place]) dx'd 1994   "right; S/P lumpectomy, chemo, XRT"   . Chronic atrial fibrillation ([place]) 09/06/2011  . Chronic lower GI bleeding   . Complication of anesthesia    "once I'm awakened, I don't sleep til the following night" BP drops very low had to stop putting in [place] -a- cath  .  GERD (gastroesophageal reflux disease)   . Gout   . Heart murmur   . High cholesterol   . History of blood transfusion 1996; ~ 2013   "related to valve replacement; GI bleeding"   . History of breast cancer 02/16/2016  . History of echocardiogram    Echo 1/17: mild LVH, EF 50-55%, no RWMA, mild AI, mechanical AVR with mean 21 mmHg/peak 42 mmHg (stable since 2012), mod MR, mod to severe BAE, PASP 36 mmHg  . History of nuclear stress test    Myoview 1/17: EF 59%, normal perfusion, low risk  . Hypertension   . Hypothyroidism   . Lung cancer ([place]) dx'd 01/2016   SCL  . On supplemental oxygen therapy   . Permanent atrial fibrillation ([place])       . Pneumonia 07/2011  . Rectus sheath hematoma 12/19/14    held anticoagulation for 1 week, transfused  . Small cell lung cancer, right ([place]) 02/16/2016  . Stomach problems    seeing Dr Watt Climes  . TIA (transient ischemic attack) 12/24/14   symptoms resolved    Medications:  Scheduled:  . allopurinol  300 mg Oral QHS  . carvedilol  12.5 mg Oral BID WC  . levothyroxine  88 mcg Oral QAC breakfast  . mouth rinse  15  mL Mouth Rinse BID  . simvastatin  10 mg Oral Daily  . Warfarin - Pharmacist Dosing Inpatient   Does not apply q1800   Infusions:    Assessment: 25 yoF admitted on 12/19 with SOB and continued black stools from known GIB and multiple AVMs.  She continues to be chronically anticoagulated due to her mechanical AVR, afib, stroke, and lung cancer per Dr. Jana Hakim.  She was temporarily on Lovenox, but recently changed her back to Warfarin starting on 03/11/16.  Her home warfarin dose was reduced to '5mg'$  daily except 2.'5mg'$  on MWF starting on 03/18/16.  Last warfarin dose taken on 12/18 at 1600. INR was 4.30 on admission.  Significant events: 12/19: S/p thoracentesis of recurrent right pleural effusion yielding 1.3 L of fluid. 12/20: hold warfarin for possible pleurx (per Rowe Robert, PA- INR needs to be <1.6 for procedure), start heparin  when INR <2  Today, 03/20/2016: -  INR now therapeutic at 2.38 - hgb low at 8.7 but relatively stable - no bleeding documented - no significant drug-drug intxns - regular diet   Goal of Therapy:  INR goal is 2-2.5 (cardiology note 03/11/16) Monitor platelets by anticoagulation protocol: Yes   Plan:  - hold warfarin  - f/u with INR in AM and start heparin if/when INR <2 - monitor for s/s bleeding  Dia Sitter, PharmD, BCPS 03/20/2016 8:38 AM

## 2016-03-20 NOTE — Progress Notes (Signed)
Pt is from [facility]. If pt wants / needs [facility] at [facility] CSW is able to assist with d/c planning.   Werner Lean LCSW [phone]

## 2016-03-20 NOTE — Progress Notes (Signed)
PROGRESS NOTE    Deborah [name]  HYW:[id number] DOB: 1927/01/25 Deborah [name] PCP: [name], MD   Brief Narrative: Deborah [name] is a 82 y.o. female with medical history significant of metastatic (T2b, N2, M1b) small cell lung cancer diagnosed in November 2017  S/p palliative radiation to right lung mass and currently forgoing chemo. She also has a h/o mechanical mitral valve and A-fib on Coumadin, AVMs and presumed chronic GI blood loss, Iron deficiency, CHF unspecified, HTN Hypothroid, Gout. She had a blood transfusion a few days ago and has since been increasingly dyspneic at rest and with movement. No chest pain. Mild cough, non productive. Has chronic pedal edema. She is found to have a large pleural effusion. She last had a thoracentesis on 11/19. She was admitted with acute Respiratory Failure with Hypoxia 2/2 to a Right Pleural Effusion and under went therapeutic Thoracentesis on [name].  Assessment & Plan:   Principal Problem:   Acute respiratory failure with hypoxia (HCC) Active Problems:   Hypothyroidism   S/P aortic valve replacement with metallic valve   Chronic atrial fibrillation (HCC)   Iron deficiency anemia due to chronic blood loss   Essential hypertension   Gout   Chronic GI bleeding   Pleural effusion on right   Small cell lung cancer, right (HCC)  Acute respiratory failure with hypoxia 2/2 Pleural effusion on right s/p Right Thoracentesis and removal of 1.3 Liters of pleural fluid - Apparently progressed over the past 2 days - CXR on 12/18 showed it to be a small effusion - Not certain if this is fluid overload in relation to the recent transfusion but as she has cancer on the same side, it is also malignant - This would be her second thoracentesis thus far  - C/w with Supplemental O2 as needed - Admitting physician spoke with IR and arranged thoracentesis to be performed yesterday. Per Dr Pascal Lux, no need to hold Coumadin. Have discussed with  daughter. No need to send sample to lab as this is likely cancerous or fluid overload from PRBC - Discussed case with Dr. Lurline Del and he suspects Pleural Effusion will become a reoccuring issue and will likely need Pleru-X Catheter - Consulted IR for Pleur-X Catheter placement; INR will need to be less than 1.6 so will Hold Coumadin tonight and start Heparin once INR is Less than 2.0 (pharmacy to dose) -Repeat CXR pending  Small cell lung cancer, right  - Palliative radiation complete- is not wanting chemo  - Likely contributing to patient's Pleural Effusion. - Pain Control with Tramadol 50 mg po q6hprn and Celecoxib 200 mg po Dailyprn - Being managed by Dr. Lurline Del; Likely will go home with Hospice after Holidays  S/P Aortic Valve replacement with metallic valve - INR this AM was 2.38 - holding coumadin for now as INR needs to be less than 1.6 for Pleur-X Catheter placement - Will start Patient on Heparin gtt once INR is Less than 2.0  Chronic Atrial Fibrillation - C/w Coreg 12.5 mg po BID for rate control - Hold Coumadin; Will start patient on Heparin gtt once INR is less than 2 for Anticoagulation as INR needs to be less than 1.6 for Pleur-X Catheter placement - C/w Cardiac Monitoring  CHF- dehydration - mild hypotension - ? dCHF- on Lasix possibly for this also has mod pulm HTN and mod TR - Actually appears dehydrated per physical exam and BUN/ Cr ratio (56/1.04) - Continue to hold Lasix today  Venous stasis vs  right heart failure - Advised to elevated legs at home,  - TEDS if needed - Leg swelling improved per Son   Iron deficiency anemia due to chronic blood loss - Patient's Hb/Hct went from 8.5/27.1 -> 8.7/28.1 - Received Iron infusions and blood transfusion - Repeat CBC in AM   Hypotension with h/o Essential hypertension - BP on low end- Hold all Antihypertensives and Lasix  - Cont only Coreg for now for rate control  Gout - C/w Allopurinol 300  mg po Daily  Hypothyroidism - C/w Synthroid 88 mcg po Daily  DVT prophylaxis: Heparin gtt Code Status: DNR Family Communication: Discussed plan of care with Son at Bedside Disposition Plan: Home with Hospice likely  Consultants:   Interventional Radiology  Oncology Dr. Prince Rome  Procedures: Therapeutic Thoracentesis yielding 1.3 Liters of Light Fluid on [name]  Antimicrobials: None  Subjective: Seen and examined at bedside eating breakfast and states SOB was slightly better. Wears Chronic O2 at home. Thinks swelling in legs is also better. No N/V/Abdominal pain. No other concerns or complaints currently.   Objective: Vitals:   03/20/16 0700 03/20/16 0800 03/20/16 0900 03/20/16 1000  BP: (!) 79/42 (!) 78/66 (!) 95/45 (!) 90/50  Pulse:      Resp: (!) 26 18 (!) 24 (!) 28  Temp:  97.5 F (36.4 C)    TempSrc:  Oral    SpO2: 100% (!) 77% 95% 99%  Weight:      Height:        Intake/Output Summary (Last 24 hours) at 03/20/16 1311 Last data filed at 03/19/16 1938  Gross per 24 hour  Intake              500 ml  Output              100 ml  Net              400 ml   Filed Weights   03/19/16 1023 03/19/16 1621  Weight: 58.1 kg (128 lb) 58.3 kg (128 lb 8.5 oz)   Examination: Physical Exam:  Constitutional: Thin elderly female appears calm and comfortable Eyes: Lids and conjunctivae normal, sclerae anicteric  ENMT: External Ears, Nose appear normal. Grossly normal hearing. Mucous membranes appear slightly dry Neck: Appears normal, supple, no cervical masses, normal ROM, no appreciable thyromegaly or JVD. Respiratory: Diminished to auscultation in Right Base, no wheezing, rales, rhonchi or crackles. Slightly increased respiratory effort. No accessory muscle use.  Cardiovascular: Irregularly Irregular, no murmurs / rubs / gallops. S1 and S2 auscultated. 1+ Lower Edema. Abdomen: Soft, non-tender, non-distended. No masses palpated. No appreciable hepatosplenomegaly.  Bowel sounds positive.  GU: Deferred. Musculoskeletal: No clubbing / cyanosis of digits/nails. No joint deformity upper and lower extremities. Skin: No rashes, lesions, ulcers on limited skin evaluation. No induration; Warm and dry.  Neurologic: CN 2-12 grossly intact with no focal deficits. Sensation intact in all 4 Extremities. Romberg sign cerebellar reflexes not assessed.  Psychiatric: Normal judgment and insight. Alert and oriented x 3. Normal mood and appropriate affect.   Data Reviewed: I have personally reviewed following labs and imaging studies  CBC:  Recent Labs Lab 03/15/16 1131 03/18/16 1037 03/19/16 1107 03/20/16 0402  WBC 3.2* 3.6* 4.8 6.6  NEUTROABS 2.3 2.7 3.8  --   HGB 8.4* 9.0* 8.5* 8.7*  HCT 26.8* 29.6* 27.1* 28.1*  MCV 103.9* 103.9* 101.5* 106.8*  PLT 119* 177 170 703   Basic Metabolic Panel:  Recent Labs Lab 03/18/16 1037 03/19/16 1107  03/20/16 0402  NA 141 141 137  K 4.8 5.1 4.9  CL  --  100* 99*  CO2 37* 37* 35*  GLUCOSE 108 128* 117*  BUN 56.8* 58* 56*  CREATININE 1.0 0.95 1.04*  CALCIUM 9.9 9.6 9.5   GFR: Estimated Creatinine Clearance: 30.4 mL/min (by C-G formula based on SCr of 1.04 mg/dL (H)). Liver Function Tests:  Recent Labs Lab 03/18/16 1037 03/19/16 1107  AST 14 75*  ALT 19 85*  ALKPHOS 58 57  BILITOT <0.22 0.4  PROT 5.4* 5.1*  ALBUMIN 2.8* 3.1*    Recent Labs Lab 03/19/16 1107  LIPASE 33   No results for input(s): AMMONIA in the last 168 hours. Coagulation Profile:  Recent Labs Lab 03/14/16 1049 03/15/16 1130 03/18/16 1310 03/19/16 1107 03/20/16 0402  INR 1.40* 1.60* 4.30* 2.74 2.38  PROTIME 16.8* 19.2* 51.6*  --   --    Cardiac Enzymes: No results for input(s): CKTOTAL, CKMB, CKMBINDEX, TROPONINI in the last 168 hours. BNP (last 3 results) No results for input(s): PROBNP in the last 8760 hours. HbA1C: No results for input(s): HGBA1C in the last 72 hours. CBG: No results for input(s): GLUCAP in the  last 168 hours. Lipid Profile: No results for input(s): CHOL, HDL, LDLCALC, TRIG, CHOLHDL, LDLDIRECT in the last 72 hours. Thyroid Function Tests: No results for input(s): TSH, T4TOTAL, FREET4, T3FREE, THYROIDAB in the last 72 hours. Anemia Panel: No results for input(s): VITAMINB12, FOLATE, FERRITIN, TIBC, IRON, RETICCTPCT in the last 72 hours. Sepsis Labs: No results for input(s): PROCALCITON, LATICACIDVEN in the last 168 hours.  Recent Results (from the past 240 hour(s))  MRSA PCR Screening     Status: None   Collection Time: 03/19/16  4:03 PM  Result Value Ref Range Status   MRSA by PCR NEGATIVE NEGATIVE Final    Comment:        The GeneXpert MRSA Assay (FDA approved for NASAL specimens only), is one component of a comprehensive MRSA colonization surveillance program. It is not intended to diagnose MRSA infection nor to guide or monitor treatment for MRSA infections.     Radiology Studies: Dg Chest 2 View  Result Date: [name] CLINICAL DATA:  Increased shortness of breath EXAM: CHEST  2 VIEW COMPARISON:  03/18/2016 FINDINGS: There is moderate right pleural effusion. There is a left pleural effusion. There is no pneumothorax. There is no focal consolidation. There is stable cardiomediastinal silhouette. There is a persistent right hilar soft tissue mass. There is evidence of prior CABG. There is a Port-A-Cath with its tip projecting over expected duplicated left SVC. The osseous structures are unremarkable. IMPRESSION: 1. Persistent right hilar soft tissue mass. Moderate right pleural effusion. Electronically Signed   By: Kathreen Devoid   On: [name] 10:56   Ct Angio Chest Pe W And/or Wo Contrast  Result Date: [name] CLINICAL DATA:  Shortness of breath. History of right breast cancer. EXAM: CT ANGIOGRAPHY CHEST WITH CONTRAST TECHNIQUE: Multidetector CT imaging of the chest was performed using the standard protocol during bolus administration of intravenous contrast.  Multiplanar CT image reconstructions and MIPs were obtained to evaluate the vascular anatomy. CONTRAST:  100 mL of Isovue 370 intravenously. COMPARISON:  CT scan of February 18, 2016. FINDINGS: Cardiovascular: 5.5 cm ascending thoracic aortic aneurysm is noted. Aortic atherosclerosis is noted. No dissection is noted. There is no evidence of large central pulmonary embolus. However, due to respiratory motion artifact, smaller peripheral pulmonary emboli in lower branches cannot be excluded on the basis  of this exam. Left internal jugular Port-A-Cath is again noted with distal tip in excess tree azygos vein on the left which potentially flows into right atrium. Enlargement of pulmonary arteries is noted suggesting pulmonary artery hypertension. Mediastinum/Nodes: Right peritracheal adenopathy is noted with largest lymph node measuring 20 x 15 mm. Lungs/Pleura: No pneumothorax is noted. Left lung is unremarkable. Large right pleural effusion is noted with complete atelectasis of right lower lobe. Right upper lobe opacity is noted concerning for pneumonia or atelectasis. Upper Abdomen: Multiple left renal cysts are noted. 2.8 cm left adrenal mass is noted concerning for metastatic disease. Musculoskeletal: No chest wall abnormality. No acute or significant osseous findings. Review of the MIP images confirms the above findings. IMPRESSION: 5.5 cm ascending thoracic aortic aneurysm. Recommend semi-annual imaging followup by CTA or MRA and referral to cardiothoracic surgery if not already obtained. This recommendation follows 2010 ACCF/AHA/AATS/ACR/ASA/SCA/SCAI/SIR/STS/SVM Guidelines for the Diagnosis and Management of Patients With Thoracic Aortic Disease. Circulation. 2010; 121: [id number]. No evidence of large central pulmonary embolus. However, due to artifact related to respiratory motion, smaller peripheral pulmonary emboli cannot be excluded on the basis of this exam. Enlargement of pulmonary arteries is noted  suggesting pulmonary artery hypertension. Right peritracheal adenopathy is noted consistent with metastatic disease. 2.8 cm left adrenal mass is noted concerning for metastatic disease. Electronically Signed   By: Marijo Conception, M.D.   On: [name] 13:37   Dg Chest Port 1 View  Result Date: [name] CLINICAL DATA:  82 year old female post right-sided thoracentesis. Subsequent encounter. EXAM: PORTABLE CHEST 1 VIEW COMPARISON:  [name] CT and chest x-ray. FINDINGS: No pneumothorax post right-sided thoracentesis. Decrease size of right-sided pleural effusion with small pleural effusion remaining. Cardiomegaly. Central pulmonary vascular prominence. Atypical location of the left central line unchanged. CT detected mediastinal adenopathy not appreciated on present exam. Calcified tortuous aorta.  Please see CT report. IMPRESSION: Post right-sided thoracentesis with decrease in size of right-sided pleural effusion without evidence of pneumothorax. Electronically Signed   By: Genia Del M.D.   On: [name] 18:22   US Thoracentesis Asp Pleural Space W/img Guide  Result Date: [name] INDICATION: Metastatic lung cancer, dyspnea, recurrent right pleural effusion. Request made for therapeutic right thoracentesis. EXAM: ULTRASOUND GUIDED THERAPEUTIC RIGHT THORACENTESIS MEDICATIONS: None. COMPLICATIONS: None immediate. PROCEDURE: An ultrasound guided thoracentesis was thoroughly discussed with the patient and questions answered. The benefits, risks, alternatives and complications were also discussed. The patient understands and wishes to proceed with the procedure. Written consent was obtained. Ultrasound was performed to localize and mark an adequate pocket of fluid in the right chest. The area was then prepped and draped in the normal sterile fashion. 1% Lidocaine was used for local anesthesia. Under ultrasound guidance a Safe-T-Centesis catheter was introduced. Thoracentesis was performed. The  catheter was removed and a dressing applied. FINDINGS: A total of approximately 1.3 liters of light yellow fluid was removed. IMPRESSION: Successful ultrasound guided therapeutic right thoracentesis yielding 1.3 liters of pleural fluid. Read by: Rowe Robert, PA-C Electronically Signed   By: Sandi Mariscal M.D.   On: [name] 17:48   Scheduled Meds: . allopurinol  300 mg Oral QHS  . carvedilol  12.5 mg Oral BID WC  . levothyroxine  88 mcg Oral QAC breakfast  . mouth rinse  15 mL Mouth Rinse BID  . simvastatin  10 mg Oral Daily  . Warfarin - Pharmacist Dosing Inpatient   Does not apply q1800   Continuous Infusions:   LOS: 1 day  Kerney Elbe, DO [facility] Pager [phone]  If 7PM-7AM, please contact night-coverage [email or web address] Password [email or web address] 03/20/2016, 1:11 PM

## 2016-03-20 NOTE — Progress Notes (Signed)
Notified MD of patients BP in the 47'V systolic. New orders given. Will continue to monitor.

## 2016-03-20 NOTE — Progress Notes (Signed)
Patient ID: Deborah [name], female   DOB: 05-May-1926, 82 y.o.   MRN: [id number] Aware of request for rt pleurx cath placement on pt. Imaging reviewed by Dr. Annamaria Boots. Last thoracentesis 12/19 with 1.3 liters removed. PT/INR today 26.5/2.38. In order to minimize bleeding risks during pleurx placement, INR should be 1.6 or less. Coumadin currently being held with transition to heparin planned once INR <2. If IV heparin planned this will need to be held 2-4 hrs prior to procedure; if lovenox it should be held 24 hrs prior to case. Pt will need adequate volume of effusion on imaging to proceed with cath placement as well. Will cont to monitor and plan accordingly once above parameters met.

## 2016-03-20 NOTE — Telephone Encounter (Signed)
Per patient's daughter's request, [name], order faxed to [facility] for nasal mask for oxygen delivery.

## 2016-03-21 ENCOUNTER — Encounter (HOSPITAL_COMMUNITY): Payer: Self-pay | Admitting: General Surgery

## 2016-03-21 ENCOUNTER — Inpatient Hospital Stay (HOSPITAL_COMMUNITY): Payer: Medicare Other

## 2016-03-21 ENCOUNTER — Other Ambulatory Visit: Payer: Self-pay | Admitting: *Deleted

## 2016-03-21 DIAGNOSIS — I4891 Unspecified atrial fibrillation: Secondary | ICD-10-CM

## 2016-03-21 DIAGNOSIS — C3411 Malignant neoplasm of upper lobe, right bronchus or lung: Principal | ICD-10-CM

## 2016-03-21 DIAGNOSIS — Q2733 Arteriovenous malformation of digestive system vessel: Secondary | ICD-10-CM

## 2016-03-21 LAB — COMPREHENSIVE METABOLIC PANEL
ALBUMIN: 2.6 g/dL — AB (ref 3.5–5.0)
ALK PHOS: 49 U/L (ref 38–126)
ALT: 81 U/L — ABNORMAL HIGH (ref 14–54)
ANION GAP: 3 — AB (ref 5–15)
AST: 35 U/L (ref 15–41)
BILIRUBIN TOTAL: 0.5 mg/dL (ref 0.3–1.2)
BUN: 49 mg/dL — ABNORMAL HIGH (ref 6–20)
CALCIUM: 9.5 mg/dL (ref 8.9–10.3)
CO2: 36 mmol/L — ABNORMAL HIGH (ref 22–32)
Chloride: 99 mmol/L — ABNORMAL LOW (ref 101–111)
Creatinine, Ser: 0.97 mg/dL (ref 0.44–1.00)
GFR calc non Af Amer: 51 mL/min — ABNORMAL LOW (ref >60)
GFR, EST AFRICAN AMERICAN: 59 mL/min — AB (ref >60)
GLUCOSE: 128 mg/dL — AB (ref 65–99)
POTASSIUM: 4.7 mmol/L (ref 3.5–5.1)
SODIUM: 138 mmol/L (ref 135–145)
TOTAL PROTEIN: 4.7 g/dL — AB (ref 6.5–8.1)

## 2016-03-21 LAB — PROTIME-INR
INR: 1.59
PROTHROMBIN TIME: 19.1 s — AB (ref 11.4–15.2)

## 2016-03-21 LAB — CBC WITH DIFFERENTIAL/PLATELET
BASOS PCT: 0 %
Basophils Absolute: 0 10*3/uL (ref 0.0–0.1)
EOS ABS: 0.1 10*3/uL (ref 0.0–0.7)
Eosinophils Relative: 1 %
HEMATOCRIT: 26.5 % — AB (ref 36.0–46.0)
Hemoglobin: 8 g/dL — ABNORMAL LOW (ref 12.0–15.0)
LYMPHS ABS: 0.7 10*3/uL (ref 0.7–4.0)
Lymphocytes Relative: 14 %
MCH: 32.4 pg (ref 26.0–34.0)
MCHC: 30.2 g/dL (ref 30.0–36.0)
MCV: 107.3 fL — ABNORMAL HIGH (ref 78.0–100.0)
MONO ABS: 0.5 10*3/uL (ref 0.1–1.0)
MONOS PCT: 11 %
Neutro Abs: 3.7 10*3/uL (ref 1.7–7.7)
Neutrophils Relative %: 74 %
Platelets: 216 10*3/uL (ref 150–400)
RBC: 2.47 MIL/uL — ABNORMAL LOW (ref 3.87–5.11)
RDW: 18 % — AB (ref 11.5–15.5)
WBC: 4.9 10*3/uL (ref 4.0–10.5)

## 2016-03-21 LAB — HEPARIN LEVEL (UNFRACTIONATED): HEPARIN UNFRACTIONATED: 0.17 [IU]/mL — AB (ref 0.30–0.70)

## 2016-03-21 LAB — PREPARE RBC (CROSSMATCH)

## 2016-03-21 LAB — MAGNESIUM: Magnesium: 2.3 mg/dL (ref 1.7–2.4)

## 2016-03-21 LAB — PHOSPHORUS: PHOSPHORUS: 3.4 mg/dL (ref 2.5–4.6)

## 2016-03-21 MED ORDER — FUROSEMIDE 10 MG/ML IJ SOLN: 40.0000 mg | Freq: Once | INTRAMUSCULAR | Status: DC

## 2016-03-21 MED ORDER — SODIUM CHLORIDE 0.9% FLUSH: 10.0000 mL | INTRAVENOUS | Status: DC | PRN

## 2016-03-21 MED ORDER — FUROSEMIDE 10 MG/ML IJ SOLN
20.0000 mg | Freq: Once | INTRAMUSCULAR | Status: AC
  Administered 2016-03-21: 20 mg via INTRAVENOUS
  Filled 2016-03-21: qty 2

## 2016-03-21 MED ORDER — HEPARIN BOLUS VIA INFUSION
2000.0000 [IU] | Freq: Once | INTRAVENOUS | Status: AC
  Administered 2016-03-21: 2000 [IU] via INTRAVENOUS
  Filled 2016-03-21: qty 2000

## 2016-03-21 MED ORDER — HEPARIN SOD (PORK) LOCK FLUSH 100 UNIT/ML IV SOLN
500.0000 [IU] | Freq: Every day | INTRAVENOUS | Status: DC | PRN
  Filled 2016-03-21: qty 5

## 2016-03-21 MED ORDER — HYDROXYZINE HCL 25 MG PO TABS
25.0000 mg | ORAL_TABLET | Freq: Three times a day (TID) | ORAL | Status: DC | PRN
  Administered 2016-03-22: 25 mg via ORAL
  Filled 2016-03-21: qty 1

## 2016-03-21 MED ORDER — HEPARIN (PORCINE) IN NACL 100-0.45 UNIT/ML-% IJ SOLN
750.0000 [IU]/h | INTRAMUSCULAR | Status: DC
  Administered 2016-03-21: 750 [IU]/h via INTRAVENOUS
  Filled 2016-03-21: qty 250

## 2016-03-21 MED ORDER — HEPARIN (PORCINE) IN NACL 100-0.45 UNIT/ML-% IJ SOLN
950.0000 [IU]/h | INTRAMUSCULAR | Status: DC
  Administered 2016-03-22: 950 [IU]/h via INTRAVENOUS
  Filled 2016-03-21: qty 250

## 2016-03-21 MED ORDER — SODIUM CHLORIDE 0.9 % IV SOLN: 250.0000 mL | Freq: Once | INTRAVENOUS | Status: DC

## 2016-03-21 MED ORDER — HEPARIN SOD (PORK) LOCK FLUSH 100 UNIT/ML IV SOLN
250.0000 [IU] | INTRAVENOUS | Status: DC | PRN
  Filled 2016-03-21: qty 3

## 2016-03-21 MED ORDER — SODIUM CHLORIDE 0.9% FLUSH: 3.0000 mL | INTRAVENOUS | Status: DC | PRN

## 2016-03-21 NOTE — Progress Notes (Addendum)
ANTICOAGULATION CONSULT NOTE - Initial Consult  Pharmacy Consult for Heparin Indication: mechanical AVR--warfarin bridge  Allergies  Allergen Reactions  . Alendronate Sodium Other (See Comments)    GI PAIN CAUTION: RISK FOR PERFORATION  . Amiodarone Shortness Of Breath  . Chlorthalidone Shortness Of Breath    ABDOMINAL BLOATING  . Macrodantin [Nitrofurantoin] Nausea And Vomiting and Other (See Comments)    CONTRAINDICATED WITH GFR < 50  . Meclizine Swelling    SWELLING OF TONGUE  . Norvasc [Amlodipine Besylate] Swelling    SWELLING REACTION UNSPECIFIED   . Hydralazine Hcl Nausea Only  . Sulfa Antibiotics Other (See Comments)    Unknown  . Tussionex Pennkinetic Er [Hydrocod Polst-Cpm Polst Er] Other (See Comments)    UNSPECIFIED REACTION     Patient Measurements: Height: 5' (152.4 cm) Weight: 128 lb 8.5 oz (58.3 kg) IBW/kg (Calculated) : 45.5 Heparin Dosing Weight: 57.3 kg  Vital Signs: Temp: 98.8 F (37.1 C) (12/21 1200) Temp Source: Oral (12/21 1200) BP: 90/46 (12/21 1400) Pulse Rate: 120 (12/21 0827)  Labs:  Recent Labs  03/19/16 1107 03/20/16 0402 03/21/16 0330 03/21/16 1430  HGB 8.5* 8.7* 8.0*  --   HCT 27.1* 28.1* 26.5*  --   PLT 170 227 216  --   LABPROT 29.6* 26.5* 19.1*  --   INR 2.74 2.38 1.59  --   HEPARINUNFRC  --   --   --  0.17*  CREATININE 0.95 1.04* 0.97  --     Estimated Creatinine Clearance: 32.6 mL/min (by C-G formula based on SCr of 0.97 mg/dL).   Medications:  Prescriptions Prior to Admission  Medication Sig Dispense Refill Last Dose  . acetaminophen (TYLENOL) 500 MG tablet Take 1,000 mg by mouth every 6 (six) hours as needed for mild pain, moderate pain or headache. Reported on 09/26/2015   unknown  . allopurinol (ZYLOPRIM) 300 MG tablet Take 300 mg by mouth at bedtime.    Past Week at Unknown time  . calcium-vitamin D 500 MG tablet Take 1 tablet by mouth 2 (two) times daily. Reported on 09/26/2015   Past Month at Unknown time  .  carvedilol (COREG) 12.5 MG tablet Take 1 tablet (12.5 mg total) by mouth 2 (two) times daily with a meal. 60 tablet 6 03/18/2016 at 2200  . celecoxib (CELEBREX) 200 MG capsule Take 200 mg by mouth daily as needed for moderate pain.   4 unknown  . dicyclomine (BENTYL) 10 MG capsule Take 10 mg by mouth daily as needed for spasms.    unknown  . fexofenadine (ALLEGRA ALLERGY) 180 MG tablet Take 180 mg by mouth daily as needed for allergies.    Past Week at Unknown time  . furosemide (LASIX) 40 MG tablet Take 1-2 tablets daily as directed. (Patient taking differently: Take 20-40 mg by mouth 2 (two) times daily as needed for fluid. ) 45 tablet 6 Past Week at Unknown time  . hydrOXYzine (ATARAX/VISTARIL) 25 MG tablet Take 25 mg by mouth 3 (three) times daily as needed for anxiety.   unknown  . levothyroxine (SYNTHROID, LEVOTHROID) 88 MCG tablet Take 1 tablet (88 mcg total) by mouth daily before breakfast. 90 tablet 3 03/18/2016 at Unknown time  . LORazepam (ATIVAN) 0.5 MG tablet Take 1 tablet (0.5 mg total) by mouth every 8 (eight) hours as needed for anxiety. 30 tablet 0 unknown  . NITROSTAT 0.4 MG SL tablet Place 1 tablet (0.4 mg total) under the tongue every 5 (five) minutes as needed for chest  pain (x 3 doses). Reported on 10/17/2015 25 tablet 5 unknown  . OXYGEN Inhale 3 L into the lungs continuous.    03/19/2016 at Unknown time  . potassium chloride SA (K-DUR,KLOR-CON) 20 MEQ tablet Take 0.5 tablets (10 mEq total) by mouth 2 (two) times daily as needed (taking with Lasix).   Past Week at Unknown time  . Probiotic Product (PROBIOTIC ADVANCED PO) Take 1 tablet by mouth daily. Reported on 10/17/2015   Past Month at Unknown time  . simvastatin (ZOCOR) 20 MG tablet Take 10 mg by mouth daily.    03/18/2016 at Unknown time  . traMADol (ULTRAM) 50 MG tablet Take 1 tablet (50 mg total) by mouth every 6 (six) hours as needed. (Patient taking differently: Take 50 mg by mouth every 6 (six) hours as needed for moderate  pain. ) 30 tablet 0 unknown  . valsartan (DIOVAN) 160 MG tablet Take 2 tablets (320 mg total) by mouth daily. 30 tablet 6 03/18/2016 at Unknown time  . warfarin (COUMADIN) 5 MG tablet Take 2.5-5 mg by mouth daily. Takes 5 mg everyday except 2.5 mg on Monday, Wednesday, and Friday   03/18/2016 at 1600  . zolpidem (AMBIEN CR) 12.5 MG CR tablet Take 12.5 mg by mouth at bedtime as needed for sleep.   03/18/2016  . [DISCONTINUED] zolpidem (AMBIEN) 5 MG tablet Take 5 mg by mouth at bedtime.    03/18/2016 at Unknown time  . dexamethasone (DECADRON) 4 MG tablet Take 2 tablets (8 mg total) by mouth 2 (two) times daily with a meal. (Patient not taking: Reported on 03/19/2016) 40 tablet 3 Not Taking at Unknown time  . enoxaparin (LOVENOX) 100 MG/ML injection Inject 0.5 mLs (50 mg total) into the skin every 12 (twelve) hours. Please dispense 2 week supply, zero refills (Patient not taking: Reported on 03/19/2016) 0 Syringe  Not Taking at Unknown time   Scheduled:  . sodium chloride  250 mL Intravenous Once  . carvedilol  12.5 mg Oral BID WC  . levothyroxine  88 mcg Oral QAC breakfast  . mouth rinse  15 mL Mouth Rinse BID   Infusions:  . heparin 750 Units/hr (03/21/16 0600)    Assessment: 37 yoF admitted on 12/19 with SOB and continued black stools from known GIB and multiple AVMs.  She continues to be chronically anticoagulated due to her mechanical AVR, afib, stroke, and lung cancer per Dr. Jana Hakim. She was temporarily on Lovenox, but recently changed her back to Warfarin starting on 03/11/16.   Baseline INR elevated on admission - 4.3  Prior anticoagulation: warfarin '5mg'$  daily except 2.'5mg'$  on MWF starting on 03/18/16  Significant events:  12/19: S/p thoracentesis of recurrent right pleural effusion yielding 1.3 L of fluid.  12/20: hold warfarin for possible pleurx (per PA, INR needs to be <1.6 for procedure), start heparin when INR <2.  12/21: heparin started in AM, planning for PleurX  placement 12/22  Today, 03/21/2016:  CBC: Hgb low but stable, Plt wnl  Most recent heparin level SUBtherapeutic on 750 units/hr  No current bleeding or infusion issues per nursing  CrCl: 33 ml/min  Goal of Therapy: INR 2-2.5 per cardiology note 03/11/16 Heparin level 0.3-0.7 units/ml Monitor platelets by anticoagulation protocol: Yes  Plan:  Warfarin remains on hold for procedure tomorrow  Heparin 2000 units bolus x 1; would not typically bolus given combined age, weight, and renal function, but considering clotting risk and Oncologist's concern, will be more aggressive with anticoagulation  Increase heparin IV infusion  to 950 units/hr  Check heparin level 8 hrs after rate change  Daily CBC, daily heparin level once stable  Monitor for signs of bleeding or thrombosis   Reuel Boom, PharmD Pager: [phone] 03/21/2016, 3:38 PM

## 2016-03-21 NOTE — Progress Notes (Signed)
Deborah [name]   DOB:[birth year]   MH#:[id number]   [id number]#:[id number]  Subjective: the patient is moderately uncomfortable this morning. She is more SOB. Family feels she may not be getting her O2 overnight as she may be pulling her [place] off. Son in room   Objective: elderly White woman exmained in bed Vitals:   03/21/16 0500 03/21/16 0600  BP:  (!) 108/58  Pulse:    Resp: (!) 21 (!) 33  Temp:      Body mass index is 25.1 kg/m.  Intake/Output Summary (Last 24 hours) at 03/21/16 0827 Last data filed at 03/21/16 0600  Gross per 24 hour  Intake             5.13 ml  Output                0 ml  Net             5.13 ml     Sclerae unicteric  Lungs no wheezes--auscultated anterolaterally  Neuro nonfocal  Breast exam: deferred  CBG (last 3)  No results for input(s): GLUCAP in the last 72 hours.   Labs:  Lab Results  Component Value Date   WBC 4.9 03/21/2016   HGB 8.0 (L) 03/21/2016   HCT 26.5 (L) 03/21/2016   MCV 107.3 (H) 03/21/2016   PLT 216 03/21/2016   NEUTROABS 3.7 03/21/2016    '@LASTCHEMISTRY'$ @  Urine Studies No results for input(s): UHGB, CRYS in the last 72 hours.  Invalid input(s): UACOL, UAPR, USPG, UPH, UTP, UGL, UKET, UBIL, UNIT, UROB, [place], UEPI, UWBC, [place], [place], [place], [place], [place]  Basic Metabolic Panel:  Recent Labs Lab 03/18/16 1037  03/19/16 1107 03/20/16 0402 03/21/16 0330  NA 141  --  141 137 138  K 4.8  < > 5.1 4.9 4.7  CL  --   --  100* 99* 99*  CO2 37*  --  37* 35* 36*  GLUCOSE 108  --  128* 117* 128*  BUN 56.8*  --  58* 56* 49*  CREATININE 1.0  --  0.95 1.04* 0.97  CALCIUM 9.9  --  9.6 9.5 9.5  MG  --   --   --   --  2.3  PHOS  --   --   --   --  3.4  < > = values in this interval not displayed. GFR Estimated Creatinine Clearance: 32.6 mL/min (by C-G formula based on SCr of 0.97 mg/dL). Liver Function Tests:  Recent Labs Lab 03/18/16 1037 03/19/16 1107 03/21/16 0330  AST 14 75* 35  ALT 19 85* 81*  ALKPHOS 58 57 49  BILITOT <0.22  0.4 0.5  PROT 5.4* 5.1* 4.7*  ALBUMIN 2.8* 3.1* 2.6*    Recent Labs Lab 03/19/16 1107  LIPASE 33   No results for input(s): AMMONIA in the last 168 hours. Coagulation profile  Recent Labs Lab 03/14/16 1049 03/15/16 1130 03/18/16 1310 03/19/16 1107 03/20/16 0402 03/21/16 0330  INR 1.40* 1.60* 4.30* 2.74 2.38 1.59  PROTIME 16.8* 19.2* 51.6*  --   --   --     CBC:  Recent Labs Lab 03/15/16 1131 03/18/16 1037 03/19/16 1107 03/20/16 0402 03/21/16 0330  WBC 3.2* 3.6* 4.8 6.6 4.9  NEUTROABS 2.3 2.7 3.8  --  3.7  HGB 8.4* 9.0* 8.5* 8.7* 8.0*  HCT 26.8* 29.6* 27.1* 28.1* 26.5*  MCV 103.9* 103.9* 101.5* 106.8* 107.3*  PLT 119* 177 170 227 216   Cardiac Enzymes: No results for input(s): CKTOTAL,  CKMB, CKMBINDEX, TROPONINI in the last 168 hours. BNP: Invalid input(s): POCBNP CBG: No results for input(s): GLUCAP in the last 168 hours. D-Dimer No results for input(s): DDIMER in the last 72 hours. Hgb A1c No results for input(s): HGBA1C in the last 72 hours. Lipid Profile No results for input(s): CHOL, HDL, LDLCALC, TRIG, CHOLHDL, LDLDIRECT in the last 72 hours. Thyroid function studies No results for input(s): TSH, T4TOTAL, T3FREE, THYROIDAB in the last 72 hours.  Invalid input(s): FREET3 Anemia work up No results for input(s): VITAMINB12, FOLATE, FERRITIN, TIBC, IRON, RETICCTPCT in the last 72 hours. Microbiology Recent Results (from the past 240 hour(s))  MRSA PCR Screening     Status: None   Collection Time: 03/19/16  4:03 PM  Result Value Ref Range Status   MRSA by PCR NEGATIVE NEGATIVE Final    Comment:        The GeneXpert MRSA Assay (FDA approved for NASAL specimens only), is one component of a comprehensive MRSA colonization surveillance program. It is not intended to diagnose MRSA infection nor to guide or monitor treatment for MRSA infections.       Studies:  Dg Chest 2 View  Result Date: 03/20/2016 CLINICAL DATA:  Shortness of breath.  History of pleural effusions with right thoracentesis. History of lung malignancy. EXAM: CHEST  2 VIEW COMPARISON:  Portable chest x-ray of March 19, 2016 FINDINGS: The lungs are slightly less well inflated today. The volume of pleural fluid on the right is slightly increased. There is a trace left pleural effusion. The cardiac silhouette remains enlarged. The pulmonary vascularity remains mildly engorged centrally. The power port catheter tip lies in the midportion of a left-sided duplicated SVC. This is stable. There is calcification in the wall of the aortic arch. The surgical wires are intact. The thoracic vertebral bodies are preserved in height. IMPRESSION: Slight interval deterioration in the appearance of the right hemithorax with slight increased pleural fluid and increased interstitial edema. Stable cardiomegaly. Thoracic aortic atherosclerosis. Electronically Signed   By: David  Martinique M.D.   On: 03/20/2016 13:48   Dg Chest 2 View  Result Date: 03/19/2016 CLINICAL DATA:  Increased shortness of breath EXAM: CHEST  2 VIEW COMPARISON:  03/18/2016 FINDINGS: There is moderate right pleural effusion. There is a left pleural effusion. There is no pneumothorax. There is no focal consolidation. There is stable cardiomediastinal silhouette. There is a persistent right hilar soft tissue mass. There is evidence of prior CABG. There is a Port-A-Cath with its tip projecting over expected duplicated left SVC. The osseous structures are unremarkable. IMPRESSION: 1. Persistent right hilar soft tissue mass. Moderate right pleural effusion. Electronically Signed   By: Kathreen Devoid   On: 03/19/2016 10:56   Ct Angio Chest Pe W And/or Wo Contrast  Result Date: 03/19/2016 CLINICAL DATA:  Shortness of breath. History of right breast cancer. EXAM: CT ANGIOGRAPHY CHEST WITH CONTRAST TECHNIQUE: Multidetector CT imaging of the chest was performed using the standard protocol during bolus administration of intravenous  contrast. Multiplanar CT image reconstructions and MIPs were obtained to evaluate the vascular anatomy. CONTRAST:  100 mL of Isovue 370 intravenously. COMPARISON:  CT scan of February 18, 2016. FINDINGS: Cardiovascular: 5.5 cm ascending thoracic aortic aneurysm is noted. Aortic atherosclerosis is noted. No dissection is noted. There is no evidence of large central pulmonary embolus. However, due to respiratory motion artifact, smaller peripheral pulmonary emboli in lower branches cannot be excluded on the basis of this exam. Left internal jugular Port-A-Cath is again  noted with distal tip in excess tree azygos vein on the left which potentially flows into right atrium. Enlargement of pulmonary arteries is noted suggesting pulmonary artery hypertension. Mediastinum/Nodes: Right peritracheal adenopathy is noted with largest lymph node measuring 20 x 15 mm. Lungs/Pleura: No pneumothorax is noted. Left lung is unremarkable. Large right pleural effusion is noted with complete atelectasis of right lower lobe. Right upper lobe opacity is noted concerning for pneumonia or atelectasis. Upper Abdomen: Multiple left renal cysts are noted. 2.8 cm left adrenal mass is noted concerning for metastatic disease. Musculoskeletal: No chest wall abnormality. No acute or significant osseous findings. Review of the MIP images confirms the above findings. IMPRESSION: 5.5 cm ascending thoracic aortic aneurysm. Recommend semi-annual imaging followup by CTA or MRA and referral to cardiothoracic surgery if not already obtained. This recommendation follows 2010 ACCF/AHA/AATS/ACR/ASA/SCA/SCAI/SIR/STS/SVM Guidelines for the Diagnosis and Management of Patients With Thoracic Aortic Disease. Circulation. 2010; 121: [id number]. No evidence of large central pulmonary embolus. However, due to artifact related to respiratory motion, smaller peripheral pulmonary emboli cannot be excluded on the basis of this exam. Enlargement of pulmonary arteries is  noted suggesting pulmonary artery hypertension. Right peritracheal adenopathy is noted consistent with metastatic disease. 2.8 cm left adrenal mass is noted concerning for metastatic disease. Electronically Signed   By: Marijo Conception, M.D.   On: 03/19/2016 13:37   Dg Chest Port 1 View  Result Date: 03/19/2016 CLINICAL DATA:  82 year old female post right-sided thoracentesis. Subsequent encounter. EXAM: PORTABLE CHEST 1 VIEW COMPARISON:  03/19/2016 CT and chest x-ray. FINDINGS: No pneumothorax post right-sided thoracentesis. Decrease size of right-sided pleural effusion with small pleural effusion remaining. Cardiomegaly. Central pulmonary vascular prominence. Atypical location of the left central line unchanged. CT detected mediastinal adenopathy not appreciated on present exam. Calcified tortuous aorta.  Please see CT report. IMPRESSION: Post right-sided thoracentesis with decrease in size of right-sided pleural effusion without evidence of pneumothorax. Electronically Signed   By: Genia Del M.D.   On: 03/19/2016 18:22   US Thoracentesis Asp Pleural Space W/img Guide  Result Date: 03/19/2016 INDICATION: Metastatic lung cancer, dyspnea, recurrent right pleural effusion. Request made for therapeutic right thoracentesis. EXAM: ULTRASOUND GUIDED THERAPEUTIC RIGHT THORACENTESIS MEDICATIONS: None. COMPLICATIONS: None immediate. PROCEDURE: An ultrasound guided thoracentesis was thoroughly discussed with the patient and questions answered. The benefits, risks, alternatives and complications were also discussed. The patient understands and wishes to proceed with the procedure. Written consent was obtained. Ultrasound was performed to localize and mark an adequate pocket of fluid in the right chest. The area was then prepped and draped in the normal sterile fashion. 1% Lidocaine was used for local anesthesia. Under ultrasound guidance a Safe-T-Centesis catheter was introduced. Thoracentesis was performed. The  catheter was removed and a dressing applied. FINDINGS: A total of approximately 1.3 liters of light yellow fluid was removed. IMPRESSION: Successful ultrasound guided therapeutic right thoracentesis yielding 1.3 liters of pleural fluid. Read by: Rowe Robert, PA-C Electronically Signed   By: Sandi Mariscal M.D.   On: 03/19/2016 17:48    Assessment: 82 y.o. [place] with iron deficiency anemia secondary to chronic blood loss, s/p oral iron supplementation with poor tolerance and inadequate response  (1) Feraheme started 07/24/2012, repeated whenever ferritin drops <100, mosr recent dose 03/15/2016  (2) transfusing PBBCs for Hb < 9.0 or symptoms  (3) chronic GIB extensively evaluated by GI with findings including AVM (cauterized), diverticular disease, hemorrhoids and gastric polyps  (4) on lifelong anticoagulation for AFib and mechanical aortic valve (  a) on warfarin until June 2016, when she was switched to Lovenox (b) Lovenox discontinued September 2016 after rectus sheath hematoma developed (c) warfarin resumed 12/27/2014 goal being an INR between 2.0 and 2.5  LUNG CANCER (5) CT chest and PET scan October 2017 sure 4.1 cm right upper lobe lung mass confluent with right hilar and paratracheal adenopathy, with a left adrenal nodule and evidence of postobstructive pneumonitis and a right pleural effusion (a) cytology from bronchoscopy 02/07/2016 confirmed small cell lung cancer             (b) palliative radiation to 30 Gy in 10 fractions completed 03/05/2016  (c) patient opted against further treatment, Hospice referral in process  (6) Palliative care:  (a) Right pleurx placement pending  (b) transfusion one unit PRBCs pending     Plan:  [place]'s INR is <1.6 today. Hopefully she can proceed to pleurx placement.   He Hb has dropped further. She tolerates anemia poorly. I have written for one unit PRBCs to be given later  today.  If these measures are accomplished and she is stable and moderately comfortable, could d/c to home tomorrow. She will nee HHN to assist with pleurx management and drainage at least weekly. She already has Home O2 in place.  Family feels current sleep meds are confusing her and request a mask rather than [place] to be used overnight. Stopped ambien.  We will place a Hospice referral to be activated as of 03/26/2016. She has a DNR order in place.  Greatly appreciate your help to this patient and her family!   [name], MD 03/21/2016  8:27 AM Medical Oncology and Hematology [facility] [address] [place], [place] [address] Tel. [phone]    Fax. [phone]

## 2016-03-21 NOTE — Progress Notes (Signed)
Pt. Report "not feeling well". Pt. Required increased oxygen from 5LO2 per nasal cannula to 6LO2 nasal cannula. MD notified and made aware. Will continue to monitor.

## 2016-03-21 NOTE — Progress Notes (Signed)
ANTICOAGULATION CONSULT NOTE - Initial Consult  Pharmacy Consult for Heparin Indication: mechanical AVR--warfarin bridge  Allergies  Allergen Reactions  . Alendronate Sodium Other (See Comments)    GI PAIN CAUTION: RISK FOR PERFORATION  . Amiodarone Shortness Of Breath  . Chlorthalidone Shortness Of Breath    ABDOMINAL BLOATING  . Macrodantin [Nitrofurantoin] Nausea And Vomiting and Other (See Comments)    CONTRAINDICATED WITH GFR < 50  . Meclizine Swelling    SWELLING OF TONGUE  . Norvasc [Amlodipine Besylate] Swelling    SWELLING REACTION UNSPECIFIED   . Hydralazine Hcl Nausea Only  . Sulfa Antibiotics Other (See Comments)    Unknown  . Tussionex Pennkinetic Er [Hydrocod Polst-Cpm Polst Er] Other (See Comments)    UNSPECIFIED REACTION     Patient Measurements: Height: 5' (152.4 cm) Weight: 128 lb 8.5 oz (58.3 kg) IBW/kg (Calculated) : 45.5 Heparin Dosing Weight:   Vital Signs: Temp: 99 F (37.2 C) (12/21 0312) Temp Source: Axillary (12/21 0312) BP: 111/43 (12/21 0400) Pulse Rate: 120 (12/20 1731)  Labs:  Recent Labs  03/19/16 1107 03/20/16 0402 03/21/16 0330  HGB 8.5* 8.7* 8.0*  HCT 27.1* 28.1* 26.5*  PLT 170 227 216  LABPROT 29.6* 26.5* 19.1*  INR 2.74 2.38 1.59  CREATININE 0.95 1.04* 0.97    Estimated Creatinine Clearance: 32.6 mL/min (by C-G formula based on SCr of 0.97 mg/dL).   Medical History: Past Medical History:  Diagnosis Date  . Acute CHF (congestive heart failure) ([place]) 01/10/2016  . Anemia   . Arthritis    "fingers, toes, hips; qwhere" (05/19/2013)  . Breast cancer ([place]) dx'd 1994   "right; S/P lumpectomy, chemo, XRT"   . Chronic atrial fibrillation ([place]) 09/06/2011  . Chronic lower GI bleeding   . Complication of anesthesia    "once I'm awakened, I don't sleep til the following night" BP drops very low had to stop putting in [place] -a- cath  . GERD (gastroesophageal reflux disease)   . Gout   . Heart murmur   . High cholesterol   .  History of blood transfusion 1996; ~ 2013   "related to valve replacement; GI bleeding"   . History of breast cancer 02/16/2016  . History of echocardiogram    Echo 1/17: mild LVH, EF 50-55%, no RWMA, mild AI, mechanical AVR with mean 21 mmHg/peak 42 mmHg (stable since 2012), mod MR, mod to severe BAE, PASP 36 mmHg  . History of nuclear stress test    Myoview 1/17: EF 59%, normal perfusion, low risk  . Hypertension   . Hypothyroidism   . Lung cancer ([place]) dx'd 01/2016   SCL  . On supplemental oxygen therapy   . Permanent atrial fibrillation ([place])       . Pneumonia 07/2011  . Rectus sheath hematoma 12/19/14    held anticoagulation for 1 week, transfused  . Small cell lung cancer, right ([place]) 02/16/2016  . Stomach problems    seeing Dr Watt Climes  . TIA (transient ischemic attack) 12/24/14   symptoms resolved    Medications:  Prescriptions Prior to Admission  Medication Sig Dispense Refill Last Dose  . acetaminophen (TYLENOL) 500 MG tablet Take 1,000 mg by mouth every 6 (six) hours as needed for mild pain, moderate pain or headache. Reported on 09/26/2015   unknown  . allopurinol (ZYLOPRIM) 300 MG tablet Take 300 mg by mouth at bedtime.    Past Week at Unknown time  . calcium-vitamin D 500 MG tablet Take 1 tablet by mouth 2 (  two) times daily. Reported on 09/26/2015   Past Month at Unknown time  . carvedilol (COREG) 12.5 MG tablet Take 1 tablet (12.5 mg total) by mouth 2 (two) times daily with a meal. 60 tablet 6 03/18/2016 at 2200  . celecoxib (CELEBREX) 200 MG capsule Take 200 mg by mouth daily as needed for moderate pain.   4 unknown  . dicyclomine (BENTYL) 10 MG capsule Take 10 mg by mouth daily as needed for spasms.    unknown  . fexofenadine (ALLEGRA ALLERGY) 180 MG tablet Take 180 mg by mouth daily as needed for allergies.    Past Week at Unknown time  . furosemide (LASIX) 40 MG tablet Take 1-2 tablets daily as directed. (Patient taking differently: Take 20-40 mg by mouth 2 (two) times  daily as needed for fluid. ) 45 tablet 6 Past Week at Unknown time  . hydrOXYzine (ATARAX/VISTARIL) 25 MG tablet Take 25 mg by mouth 3 (three) times daily as needed for anxiety.   unknown  . levothyroxine (SYNTHROID, LEVOTHROID) 88 MCG tablet Take 1 tablet (88 mcg total) by mouth daily before breakfast. 90 tablet 3 03/18/2016 at Unknown time  . LORazepam (ATIVAN) 0.5 MG tablet Take 1 tablet (0.5 mg total) by mouth every 8 (eight) hours as needed for anxiety. 30 tablet 0 unknown  . NITROSTAT 0.4 MG SL tablet Place 1 tablet (0.4 mg total) under the tongue every 5 (five) minutes as needed for chest pain (x 3 doses). Reported on 10/17/2015 25 tablet 5 unknown  . OXYGEN Inhale 3 L into the lungs continuous.    03/19/2016 at Unknown time  . potassium chloride SA (K-DUR,KLOR-CON) 20 MEQ tablet Take 0.5 tablets (10 mEq total) by mouth 2 (two) times daily as needed (taking with Lasix).   Past Week at Unknown time  . Probiotic Product (PROBIOTIC ADVANCED PO) Take 1 tablet by mouth daily. Reported on 10/17/2015   Past Month at Unknown time  . simvastatin (ZOCOR) 20 MG tablet Take 10 mg by mouth daily.    03/18/2016 at Unknown time  . traMADol (ULTRAM) 50 MG tablet Take 1 tablet (50 mg total) by mouth every 6 (six) hours as needed. (Patient taking differently: Take 50 mg by mouth every 6 (six) hours as needed for moderate pain. ) 30 tablet 0 unknown  . valsartan (DIOVAN) 160 MG tablet Take 2 tablets (320 mg total) by mouth daily. 30 tablet 6 03/18/2016 at Unknown time  . warfarin (COUMADIN) 5 MG tablet Take 2.5-5 mg by mouth daily. Takes 5 mg everyday except 2.5 mg on Monday, Wednesday, and Friday   03/18/2016 at 1600  . zolpidem (AMBIEN CR) 12.5 MG CR tablet Take 12.5 mg by mouth at bedtime as needed for sleep.   03/18/2016  . [DISCONTINUED] zolpidem (AMBIEN) 5 MG tablet Take 5 mg by mouth at bedtime.    03/18/2016 at Unknown time  . dexamethasone (DECADRON) 4 MG tablet Take 2 tablets (8 mg total) by mouth 2 (two)  times daily with a meal. (Patient not taking: Reported on 03/19/2016) 40 tablet 3 Not Taking at Unknown time  . enoxaparin (LOVENOX) 100 MG/ML injection Inject 0.5 mLs (50 mg total) into the skin every 12 (twelve) hours. Please dispense 2 week supply, zero refills (Patient not taking: Reported on 03/19/2016) 0 Syringe  Not Taking at Unknown time   Scheduled:  . allopurinol  300 mg Oral QHS  . carvedilol  12.5 mg Oral BID WC  . levothyroxine  88 mcg Oral QAC  breakfast  . mouth rinse  15 mL Mouth Rinse BID  . simvastatin  10 mg Oral Daily   Infusions:  . heparin      Assessment: Patient with INR < 2 this AM.  Prior orders to start Heparin when INR < 2.    Goal of Therapy:  Heparin level 0.3-0.7 units/ml Monitor platelets by anticoagulation protocol: Yes   Plan:  Heparin drip at 750  units/hr Daily CBC Next heparin level at [place], [place] 03/21/2016,5:13 AM

## 2016-03-21 NOTE — Progress Notes (Signed)
PROGRESS NOTE    Deborah [name]  DTO:[id number] DOB: April 05, 1926 DOA: 03/19/2016 PCP: [name], MD   Brief Narrative: Deborah [name] is a 82 y.o. female with medical history significant of metastatic (T2b, N2, M1b) small cell lung cancer diagnosed in November 2017  S/p palliative radiation to right lung mass and currently forgoing chemo. She also has a h/o mechanical mitral valve and A-fib on Coumadin, AVMs and presumed chronic GI blood loss, Iron deficiency, CHF unspecified, HTN Hypothroid, Gout. She had a blood transfusion a few days ago and has since been increasingly dyspneic at rest and with movement. No chest pain. Mild cough, non productive. Has chronic pedal edema. She is found to have a large pleural effusion. She last had a thoracentesis on 11/19. She was admitted with acute Respiratory Failure with Hypoxia 2/2 to a Right Pleural Effusion and under went therapeutic Thoracentesis on 03/19/2016.  Assessment & Plan:   Principal Problem:   Acute respiratory failure with hypoxia (HCC) Active Problems:   Hypothyroidism   S/P aortic valve replacement with metallic valve   Chronic atrial fibrillation (HCC)   Iron deficiency anemia due to chronic blood loss   Essential hypertension   Gout   Chronic GI bleeding   Pleural effusion on right   Small cell lung cancer, right (HCC)  Acute respiratory failure with hypoxia 2/2 Pleural effusion on right s/p Right Thoracentesis and removal of 1.3 Liters of pleural fluid - Apparently progressed over the past 2 days - CXR on 12/18 showed it to be a small effusion - Not certain if this is fluid overload in relation to the recent transfusion but as she has cancer on the same side, it is also malignant - This would be her second thoracentesis thus far  - C/w with Supplemental O2 as needed - Admitting physician spoke with IR and arranged thoracentesis to be performed. Per Dr Pascal Lux, no need to hold Coumadin. Have discussed with daughter. No  need to send sample to lab as this is likely cancerous or fluid overload from PRBC - Discussed case with Dr. Lurline Del and plan is to d/c patient with Pleru-X Catheter, if we are able to have it inserted. - Consulted IR for Pleur-X Catheter placement; INR will need to be less than 1.6 so will Hold Coumadin and start Heparin once INR is Less than 2.0 (pharmacy to dose), pt is currently on heparin. -Repeat CXR reporting worsening in chf and effusion.   Small cell lung cancer, right  - Palliative radiation complete- is not wanting chemo  - Likely contributing to patient's Pleural Effusion. - Pain Control with Tramadol 50 mg po q6hprn and Celecoxib 200 mg po Dailyprn - Being managed by Dr. Lurline Del; Likely will go home with Hospice after Holidays  S/P Aortic Valve replacement with metallic valve - INR this AM was 2.38 - holding coumadin for now as INR needs to be less than 1.6 for Pleur-X Catheter placement - Will start Patient on Heparin gtt once INR is Less than 2.0  Chronic Atrial Fibrillation - C/w Coreg 12.5 mg po BID for rate control - Hold Coumadin; Will start patient on Heparin gtt once INR is less than 2 for Anticoagulation as INR needs to be less than 1.6 for Pleur-X Catheter placement - C/w Cardiac Monitoring  CHF - Worse on imaging. As such will administer lasix. Will need to be careful as such will administer low dose lasix and reassess.  Venous stasis vs right heart failure - Advised  to elevated legs at home,  - TEDS if needed - Leg swelling improved per Son   Iron deficiency anemia due to chronic blood loss - Patient's Hb/Hct went from 8.5/27.1 -> 8.7/28.1 - Received Iron infusions and blood transfusion - Repeat CBC in AM   Essential hypertension - Cont only Coreg for rate control, low dose lasix (one time given chest x ray reports)  Gout - stable  Hypothyroidism - C/w Synthroid 88 mcg po Daily  DVT prophylaxis: Heparin gtt Code Status:  DNR Family Communication: Discussed plan of care with Son at Bedside Disposition Plan: Home with Hospice likely  Consultants:   Interventional Radiology  Oncology Dr. Prince Rome  Procedures: Therapeutic Thoracentesis yielding 1.3 Liters of Light Fluid on 03/19/2016  Antimicrobials: None  Subjective: Had discussion with patient and son regarding disposition plans. They preferred to speak with Dr. Jana Hakim regarding further disposition plans  Objective: Vitals:   03/21/16 0500 03/21/16 0600 03/21/16 0800 03/21/16 0827  BP:  (!) 108/58  132/69  Pulse:    (!) 120  Resp: (!) 21 (!) 33    Temp:   98.5 F (36.9 C)   TempSrc:   Oral   SpO2: 97% 96%    Weight:      Height:        Intake/Output Summary (Last 24 hours) at 03/21/16 1040 Last data filed at 03/21/16 0600  Gross per 24 hour  Intake             5.13 ml  Output                0 ml  Net             5.13 ml   Filed Weights   03/19/16 1023 03/19/16 1621  Weight: 58.1 kg (128 lb) 58.3 kg (128 lb 8.5 oz)   Examination: Physical Exam:  Constitutional: Thin elderly female appears calm and comfortable Eyes: Lids and conjunctivae normal, sclerae anicteric  ENMT: External Ears, Nose appear normal.  Mucous membranes appear moist Neck: Appears normal, supple, no cervical masses, normal ROM, no appreciable thyromegaly or JVD. Respiratory: Diminished to auscultation in Right Base, no wheezing, rales, rhonchi or crackles. Slightly increased respiratory effort. No accessory muscle use. Speaking in broken sentences Cardiovascular: Irregularly Irregular, no murmurs / rubs / gallops. S1 and S2 auscultated. 1+ Lower Edema. Abdomen: Soft, non-tender, non-distended. No masses palpated. No appreciable hepatosplenomegaly. Bowel sounds positive.  GU: Deferred. Musculoskeletal: No clubbing / cyanosis of digits/nails. No joint deformity upper and lower extremities. Skin: No rashes, lesions, ulcers on limited skin evaluation. No  induration; Warm and dry.  Neurologic: CN 3-12 grossly intact with no focal deficits. Sensation intact in all 4 Extremities. Hard of hearing Psychiatric: Normal judgment and insight. Alert and oriented x 3. Normal mood and appropriate affect.   Data Reviewed: I have personally reviewed following labs and imaging studies  CBC:  Recent Labs Lab 03/15/16 1131 03/18/16 1037 03/19/16 1107 03/20/16 0402 03/21/16 0330  WBC 3.2* 3.6* 4.8 6.6 4.9  NEUTROABS 2.3 2.7 3.8  --  3.7  HGB 8.4* 9.0* 8.5* 8.7* 8.0*  HCT 26.8* 29.6* 27.1* 28.1* 26.5*  MCV 103.9* 103.9* 101.5* 106.8* 107.3*  PLT 119* 177 170 227 941   Basic Metabolic Panel:  Recent Labs Lab 03/18/16 1037 03/19/16 1107 03/20/16 0402 03/21/16 0330  NA 141 141 137 138  K 4.8 5.1 4.9 4.7  CL  --  100* 99* 99*  CO2 37* 37* 35* 36*  GLUCOSE 108 128* 117* 128*  BUN 56.8* 58* 56* 49*  CREATININE 1.0 0.95 1.04* 0.97  CALCIUM 9.9 9.6 9.5 9.5  MG  --   --   --  2.3  PHOS  --   --   --  3.4   GFR: Estimated Creatinine Clearance: 32.6 mL/min (by C-G formula based on SCr of 0.97 mg/dL). Liver Function Tests:  Recent Labs Lab 03/18/16 1037 03/19/16 1107 03/21/16 0330  AST 14 75* 35  ALT 19 85* 81*  ALKPHOS 58 57 49  BILITOT <0.22 0.4 0.5  PROT 5.4* 5.1* 4.7*  ALBUMIN 2.8* 3.1* 2.6*    Recent Labs Lab 03/19/16 1107  LIPASE 33   No results for input(s): AMMONIA in the last 168 hours. Coagulation Profile:  Recent Labs Lab 03/14/16 1049 03/15/16 1130 03/18/16 1310 03/19/16 1107 03/20/16 0402 03/21/16 0330  INR 1.40* 1.60* 4.30* 2.74 2.38 1.59  PROTIME 16.8* 19.2* 51.6*  --   --   --    Cardiac Enzymes: No results for input(s): CKTOTAL, CKMB, CKMBINDEX, TROPONINI in the last 168 hours. BNP (last 3 results) No results for input(s): PROBNP in the last 8760 hours. HbA1C: No results for input(s): HGBA1C in the last 72 hours. CBG: No results for input(s): GLUCAP in the last 168 hours. Lipid Profile: No  results for input(s): CHOL, HDL, LDLCALC, TRIG, CHOLHDL, LDLDIRECT in the last 72 hours. Thyroid Function Tests: No results for input(s): TSH, T4TOTAL, FREET4, T3FREE, THYROIDAB in the last 72 hours. Anemia Panel: No results for input(s): VITAMINB12, FOLATE, FERRITIN, TIBC, IRON, RETICCTPCT in the last 72 hours. Sepsis Labs: No results for input(s): PROCALCITON, LATICACIDVEN in the last 168 hours.  Recent Results (from the past 240 hour(s))  MRSA PCR Screening     Status: None   Collection Time: 03/19/16  4:03 PM  Result Value Ref Range Status   MRSA by PCR NEGATIVE NEGATIVE Final    Comment:        The GeneXpert MRSA Assay (FDA approved for NASAL specimens only), is one component of a comprehensive MRSA colonization surveillance program. It is not intended to diagnose MRSA infection nor to guide or monitor treatment for MRSA infections.     Radiology Studies: Dg Chest 2 View  Result Date: 03/20/2016 CLINICAL DATA:  Shortness of breath. History of pleural effusions with right thoracentesis. History of lung malignancy. EXAM: CHEST  2 VIEW COMPARISON:  Portable chest x-ray of March 19, 2016 FINDINGS: The lungs are slightly less well inflated today. The volume of pleural fluid on the right is slightly increased. There is a trace left pleural effusion. The cardiac silhouette remains enlarged. The pulmonary vascularity remains mildly engorged centrally. The power port catheter tip lies in the midportion of a left-sided duplicated SVC. This is stable. There is calcification in the wall of the aortic arch. The surgical wires are intact. The thoracic vertebral bodies are preserved in height. IMPRESSION: Slight interval deterioration in the appearance of the right hemithorax with slight increased pleural fluid and increased interstitial edema. Stable cardiomegaly. Thoracic aortic atherosclerosis. Electronically Signed   By: David  Martinique M.D.   On: 03/20/2016 13:48   Dg Chest 2  View  Result Date: 03/19/2016 CLINICAL DATA:  Increased shortness of breath EXAM: CHEST  2 VIEW COMPARISON:  03/18/2016 FINDINGS: There is moderate right pleural effusion. There is a left pleural effusion. There is no pneumothorax. There is no focal consolidation. There is stable cardiomediastinal silhouette. There is a persistent right hilar soft tissue  mass. There is evidence of prior CABG. There is a Port-A-Cath with its tip projecting over expected duplicated left SVC. The osseous structures are unremarkable. IMPRESSION: 1. Persistent right hilar soft tissue mass. Moderate right pleural effusion. Electronically Signed   By: Kathreen Devoid   On: 03/19/2016 10:56   Ct Angio Chest Pe W And/or Wo Contrast  Result Date: 03/19/2016 CLINICAL DATA:  Shortness of breath. History of right breast cancer. EXAM: CT ANGIOGRAPHY CHEST WITH CONTRAST TECHNIQUE: Multidetector CT imaging of the chest was performed using the standard protocol during bolus administration of intravenous contrast. Multiplanar CT image reconstructions and MIPs were obtained to evaluate the vascular anatomy. CONTRAST:  100 mL of Isovue 370 intravenously. COMPARISON:  CT scan of February 18, 2016. FINDINGS: Cardiovascular: 5.5 cm ascending thoracic aortic aneurysm is noted. Aortic atherosclerosis is noted. No dissection is noted. There is no evidence of large central pulmonary embolus. However, due to respiratory motion artifact, smaller peripheral pulmonary emboli in lower branches cannot be excluded on the basis of this exam. Left internal jugular Port-A-Cath is again noted with distal tip in excess tree azygos vein on the left which potentially flows into right atrium. Enlargement of pulmonary arteries is noted suggesting pulmonary artery hypertension. Mediastinum/Nodes: Right peritracheal adenopathy is noted with largest lymph node measuring 20 x 15 mm. Lungs/Pleura: No pneumothorax is noted. Left lung is unremarkable. Large right pleural  effusion is noted with complete atelectasis of right lower lobe. Right upper lobe opacity is noted concerning for pneumonia or atelectasis. Upper Abdomen: Multiple left renal cysts are noted. 2.8 cm left adrenal mass is noted concerning for metastatic disease. Musculoskeletal: No chest wall abnormality. No acute or significant osseous findings. Review of the MIP images confirms the above findings. IMPRESSION: 5.5 cm ascending thoracic aortic aneurysm. Recommend semi-annual imaging followup by CTA or MRA and referral to cardiothoracic surgery if not already obtained. This recommendation follows 2010 ACCF/AHA/AATS/ACR/ASA/SCA/SCAI/SIR/STS/SVM Guidelines for the Diagnosis and Management of Patients With Thoracic Aortic Disease. Circulation. 2010; 121: [id number]. No evidence of large central pulmonary embolus. However, due to artifact related to respiratory motion, smaller peripheral pulmonary emboli cannot be excluded on the basis of this exam. Enlargement of pulmonary arteries is noted suggesting pulmonary artery hypertension. Right peritracheal adenopathy is noted consistent with metastatic disease. 2.8 cm left adrenal mass is noted concerning for metastatic disease. Electronically Signed   By: Marijo Conception, M.D.   On: 03/19/2016 13:37   Dg Chest Port 1 View  Result Date: 03/21/2016 CLINICAL DATA:  Short of breath at rest EXAM: PORTABLE CHEST 1 VIEW COMPARISON:  03/20/2016 FINDINGS: Vascular congestion is worse. Pulmonary edema is worse. Right pleural effusion is worse. There is airspace edema at the right lung base. Heart remains enlarged. Left jugular Port-A-Cath tip remains in the left SVC IMPRESSION: Worsening CHF. Electronically Signed   By: Marybelle Killings M.D.   On: 03/21/2016 08:32   Dg Chest Port 1 View  Result Date: 03/19/2016 CLINICAL DATA:  82 year old female post right-sided thoracentesis. Subsequent encounter. EXAM: PORTABLE CHEST 1 VIEW COMPARISON:  03/19/2016 CT and chest x-ray. FINDINGS:  No pneumothorax post right-sided thoracentesis. Decrease size of right-sided pleural effusion with small pleural effusion remaining. Cardiomegaly. Central pulmonary vascular prominence. Atypical location of the left central line unchanged. CT detected mediastinal adenopathy not appreciated on present exam. Calcified tortuous aorta.  Please see CT report. IMPRESSION: Post right-sided thoracentesis with decrease in size of right-sided pleural effusion without evidence of pneumothorax. Electronically Signed   By: Remo Lipps  Jeannine Kitten M.D.   On: 03/19/2016 18:22   US Thoracentesis Asp Pleural Space W/img Guide  Result Date: 03/19/2016 INDICATION: Metastatic lung cancer, dyspnea, recurrent right pleural effusion. Request made for therapeutic right thoracentesis. EXAM: ULTRASOUND GUIDED THERAPEUTIC RIGHT THORACENTESIS MEDICATIONS: None. COMPLICATIONS: None immediate. PROCEDURE: An ultrasound guided thoracentesis was thoroughly discussed with the patient and questions answered. The benefits, risks, alternatives and complications were also discussed. The patient understands and wishes to proceed with the procedure. Written consent was obtained. Ultrasound was performed to localize and mark an adequate pocket of fluid in the right chest. The area was then prepped and draped in the normal sterile fashion. 1% Lidocaine was used for local anesthesia. Under ultrasound guidance a Safe-T-Centesis catheter was introduced. Thoracentesis was performed. The catheter was removed and a dressing applied. FINDINGS: A total of approximately 1.3 liters of light yellow fluid was removed. IMPRESSION: Successful ultrasound guided therapeutic right thoracentesis yielding 1.3 liters of pleural fluid. Read by: Rowe Robert, PA-C Electronically Signed   By: Sandi Mariscal M.D.   On: 03/19/2016 17:48   Scheduled Meds: . sodium chloride  250 mL Intravenous Once  . carvedilol  12.5 mg Oral BID WC  . levothyroxine  88 mcg Oral QAC breakfast  . mouth  rinse  15 mL Mouth Rinse BID   Continuous Infusions: . heparin 750 Units/hr (03/21/16 0600)     LOS: 2 days    Velvet Bathe, DO [facility] Pager [phone]  If 7PM-7AM, please contact night-coverage [email or web address] Password [email or web address] 03/21/2016, 10:40 AM

## 2016-03-21 NOTE — Consult Note (Signed)
Chief Complaint: recurrent right pleural effusion  Referring Physician:Dr. Baird Kay  Supervising Physician: Jacqulynn Cadet  Patient Status: [facility] - In-pt  HPI: Deborah [name] is an 82 y.o. female with a history of breast cancer who has a recurrent right pleural effusion.  She had a thoracentesis on the right with 1.3L removed on 12/19.  A request was made for a pleurX catheter.  The patient has had worsening SOB since her thoracentesis 2 days ago along with worsening pulmonary edema and right effusion on today's x-rays.  She was on coumadin, but this has been held.  Her INR today is 1.59.  She has been placed on a heparin drip.  Past Medical History:  Past Medical History:  Diagnosis Date  . Acute CHF (congestive heart failure) ([place]) 01/10/2016  . Anemia   . Arthritis    "fingers, toes, hips; qwhere" (05/19/2013)  . Breast cancer ([place]) dx'd 1994   "right; S/P lumpectomy, chemo, XRT"   . Chronic atrial fibrillation ([place]) 09/06/2011  . Chronic lower GI bleeding   . Complication of anesthesia    "once I'm awakened, I don't sleep til the following night" BP drops very low had to stop putting in [place] -a- cath  . GERD (gastroesophageal reflux disease)   . Gout   . Heart murmur   . High cholesterol   . History of blood transfusion 1996; ~ 2013   "related to valve replacement; GI bleeding"   . History of breast cancer 02/16/2016  . History of echocardiogram    Echo 1/17: mild LVH, EF 50-55%, no RWMA, mild AI, mechanical AVR with mean 21 mmHg/peak 42 mmHg (stable since 2012), mod MR, mod to severe BAE, PASP 36 mmHg  . History of nuclear stress test    Myoview 1/17: EF 59%, normal perfusion, low risk  . Hypertension   . Hypothyroidism   . Lung cancer ([place]) dx'd 01/2016   SCL  . On supplemental oxygen therapy   . Permanent atrial fibrillation ([place])       . Pneumonia 07/2011  . Rectus sheath hematoma 12/19/14    held anticoagulation for 1 week, transfused  . Small cell lung  cancer, right ([place]) 02/16/2016  . Stomach problems    seeing Dr Watt Climes  . TIA (transient ischemic attack) 12/24/14   symptoms resolved    Past Surgical History:  Past Surgical History:  Procedure Laterality Date  . ABDOMINAL HYSTERECTOMY    . AORTIC VALVE REPLACEMENT  1996   [employer]  . APPENDECTOMY    . arteriogram  01/12   NO RAS   . BREAST BIOPSY Right   . BREAST LUMPECTOMY Right   . CARDIAC CATHETERIZATION    . CARDIOVERSION     X 2  . CATARACT EXTRACTION W/ INTRAOCULAR LENS  IMPLANT, BILATERAL Bilateral    bilateral cataract with lens implants  . CHOLECYSTECTOMY    . COLONOSCOPY WITH PROPOFOL N/A 11/24/2012   Procedure: COLONOSCOPY WITH PROPOFOL;  Surgeon: Jeryl Columbia, MD;  Location: [facility] ENDOSCOPY;  Service: Endoscopy;  Laterality: N/A;  . ESOPHAGOGASTRODUODENOSCOPY (EGD) WITH PROPOFOL N/A 07/25/2015   Procedure: ESOPHAGOGASTRODUODENOSCOPY (EGD) WITH PROPOFOL;  Surgeon: Clarene Essex, MD;  Location: [facility] ENDOSCOPY;  Service: Endoscopy;  Laterality: N/A;  . HOT HEMOSTASIS N/A 07/25/2015   Procedure: HOT HEMOSTASIS (ARGON PLASMA COAGULATION/BICAP);  Surgeon: Clarene Essex, MD;  Location: [facility] ENDOSCOPY;  Service: Endoscopy;  Laterality: N/A;  . VIDEO BRONCHOSCOPY WITH ENDOBRONCHIAL ULTRASOUND N/A 02/07/2016   Procedure: VIDEO BRONCHOSCOPY WITH  ENDOBRONCHIAL ULTRASOUND;  Surgeon: Collene Gobble, MD;  Location: [facility] OR;  Service: Thoracic;  Laterality: N/A;    Family History:  Family History  Problem Relation Age of Onset  . Dementia Mother   . Coronary artery disease Father   . Heart attack Father   . Breast cancer Daughter   . Colon cancer Sister   . Liver cancer Sister   . Lung cancer Brother   . COPD Brother     Social History:  reports that she quit smoking about 37 years ago. Her smoking use included Cigarettes. She has a 20.00 pack-year smoking history. She has never used smokeless tobacco. She reports that she drinks about 4.8 oz of alcohol per week . She reports that she does  not use drugs.  Allergies:  Allergies  Allergen Reactions  . Alendronate Sodium Other (See Comments)    GI PAIN CAUTION: RISK FOR PERFORATION  . Amiodarone Shortness Of Breath  . Chlorthalidone Shortness Of Breath    ABDOMINAL BLOATING  . Macrodantin [Nitrofurantoin] Nausea And Vomiting and Other (See Comments)    CONTRAINDICATED WITH GFR < 50  . Meclizine Swelling    SWELLING OF TONGUE  . Norvasc [Amlodipine Besylate] Swelling    SWELLING REACTION UNSPECIFIED   . Hydralazine Hcl Nausea Only  . Sulfa Antibiotics Other (See Comments)    Unknown  . Tussionex Pennkinetic Er [Hydrocod Polst-Cpm Polst Er] Other (See Comments)    UNSPECIFIED REACTION     Medications: Medications reviewed in Epic  Please HPI for pertinent positives, otherwise complete 10 system ROS negative.  Mallampati Score: MD Evaluation Airway: WNL Heart: WNL Abdomen: WNL Chest/ Lungs: WNL ASA  Classification: 3 Mallampati/Airway Score: Two  Physical Exam: BP 132/69   Pulse (!) 120   Temp 98.8 F (37.1 C) (Oral)   Resp (!) 33   Ht 5' (1.524 m)   Wt 128 lb 8.5 oz (58.3 kg)   SpO2 96%   BMI 25.10 kg/m  Body mass index is 25.1 kg/m. General: pleasant, elderly white female who is laying in bed in NAD HEENT: head is normocephalic, atraumatic.  Sclera are noninjected.  PERRL.  Ears and nose without any masses or lesions.  Mouth is pink and moist Heart: irregular.  Normal s1,s2. No obvious murmurs, gallops, or rubs noted.  Palpable radial  pulses bilaterally Lungs: CTAB, no wheezes, rhonchi, or rales noted.  Respiratory effort mildly labored when talking.  O2 in place.  Abd: soft, NT, ND, +BS, no masses, hernias, or organomegaly Psych: A&Ox3 with an appropriate affect.   Labs: Results for orders placed or performed during the hospital encounter of 03/19/16 (from the past 48 hour(s))  MRSA PCR Screening     Status: None   Collection Time: 03/19/16  4:03 PM  Result Value Ref Range   MRSA by PCR  NEGATIVE NEGATIVE    Comment:        The GeneXpert MRSA Assay (FDA approved for NASAL specimens only), is one component of a comprehensive MRSA colonization surveillance program. It is not intended to diagnose MRSA infection nor to guide or monitor treatment for MRSA infections.   Basic metabolic panel     Status: Abnormal   Collection Time: 03/20/16  4:02 AM  Result Value Ref Range   Sodium 137 135 - 145 mmol/L   Potassium 4.9 3.5 - 5.1 mmol/L   Chloride 99 (L) 101 - 111 mmol/L   CO2 35 (H) 22 - 32 mmol/L   Glucose, Bld 117 (  H) 65 - 99 mg/dL   BUN 56 (H) 6 - 20 mg/dL   Creatinine, Ser 1.04 (H) 0.44 - 1.00 mg/dL   Calcium 9.5 8.9 - 10.3 mg/dL   GFR calc non Af Amer 47 (L) >60 mL/min   GFR calc Af Amer 54 (L) >60 mL/min    Comment: (NOTE) The eGFR has been calculated using the CKD EPI equation. This calculation has not been validated in all clinical situations. eGFR's persistently <60 mL/min signify possible Chronic Kidney Disease.    Anion gap 3 (L) 5 - 15  CBC     Status: Abnormal   Collection Time: 03/20/16  4:02 AM  Result Value Ref Range   WBC 6.6 4.0 - 10.5 K/uL   RBC 2.63 (L) 3.87 - 5.11 MIL/uL   Hemoglobin 8.7 (L) 12.0 - 15.0 g/dL   HCT 28.1 (L) 36.0 - 46.0 %   MCV 106.8 (H) 78.0 - 100.0 fL   MCH 33.1 26.0 - 34.0 pg   MCHC 31.0 30.0 - 36.0 g/dL   RDW 17.6 (H) 11.5 - 15.5 %   Platelets 227 150 - 400 K/uL  Protime-INR     Status: Abnormal   Collection Time: 03/20/16  4:02 AM  Result Value Ref Range   Prothrombin Time 26.5 (H) 11.4 - 15.2 seconds   INR 2.38   Protime-INR     Status: Abnormal   Collection Time: 03/21/16  3:30 AM  Result Value Ref Range   Prothrombin Time 19.1 (H) 11.4 - 15.2 seconds   INR 1.59   CBC with Differential/Platelet     Status: Abnormal   Collection Time: 03/21/16  3:30 AM  Result Value Ref Range   WBC 4.9 4.0 - 10.5 K/uL   RBC 2.47 (L) 3.87 - 5.11 MIL/uL   Hemoglobin 8.0 (L) 12.0 - 15.0 g/dL   HCT 26.5 (L) 36.0 - 46.0 %   MCV  107.3 (H) 78.0 - 100.0 fL   MCH 32.4 26.0 - 34.0 pg   MCHC 30.2 30.0 - 36.0 g/dL   RDW 18.0 (H) 11.5 - 15.5 %   Platelets 216 150 - 400 K/uL   Neutrophils Relative % 74 %   Neutro Abs 3.7 1.7 - 7.7 K/uL   Lymphocytes Relative 14 %   Lymphs Abs 0.7 0.7 - 4.0 K/uL   Monocytes Relative 11 %   Monocytes Absolute 0.5 0.1 - 1.0 K/uL   Eosinophils Relative 1 %   Eosinophils Absolute 0.1 0.0 - 0.7 K/uL   Basophils Relative 0 %   Basophils Absolute 0.0 0.0 - 0.1 K/uL  Comprehensive metabolic panel     Status: Abnormal   Collection Time: 03/21/16  3:30 AM  Result Value Ref Range   Sodium 138 135 - 145 mmol/L   Potassium 4.7 3.5 - 5.1 mmol/L   Chloride 99 (L) 101 - 111 mmol/L   CO2 36 (H) 22 - 32 mmol/L   Glucose, Bld 128 (H) 65 - 99 mg/dL   BUN 49 (H) 6 - 20 mg/dL   Creatinine, Ser 0.97 0.44 - 1.00 mg/dL   Calcium 9.5 8.9 - 10.3 mg/dL   Total Protein 4.7 (L) 6.5 - 8.1 g/dL   Albumin 2.6 (L) 3.5 - 5.0 g/dL   AST 35 15 - 41 U/L   ALT 81 (H) 14 - 54 U/L   Alkaline Phosphatase 49 38 - 126 U/L   Total Bilirubin 0.5 0.3 - 1.2 mg/dL   GFR calc non Af Amer 51 (L) >  60 mL/min   GFR calc Af Amer 59 (L) >60 mL/min    Comment: (NOTE) The eGFR has been calculated using the CKD EPI equation. This calculation has not been validated in all clinical situations. eGFR's persistently <60 mL/min signify possible Chronic Kidney Disease.    Anion gap 3 (L) 5 - 15  Magnesium     Status: None   Collection Time: 03/21/16  3:30 AM  Result Value Ref Range   Magnesium 2.3 1.7 - 2.4 mg/dL  Phosphorus     Status: None   Collection Time: 03/21/16  3:30 AM  Result Value Ref Range   Phosphorus 3.4 2.5 - 4.6 mg/dL  Prepare RBC     Status: None   Collection Time: 03/21/16  4:26 PM  Result Value Ref Range   Order Confirmation ORDER PROCESSED BY BLOOD BANK    *Note: Due to a large number of results and/or encounters for the requested time period, some results have not been displayed. A complete set of results  can be found in Results Review.    Imaging: Dg Chest 2 View  Result Date: 03/20/2016 CLINICAL DATA:  Shortness of breath. History of pleural effusions with right thoracentesis. History of lung malignancy. EXAM: CHEST  2 VIEW COMPARISON:  Portable chest x-ray of March 19, 2016 FINDINGS: The lungs are slightly less well inflated today. The volume of pleural fluid on the right is slightly increased. There is a trace left pleural effusion. The cardiac silhouette remains enlarged. The pulmonary vascularity remains mildly engorged centrally. The power port catheter tip lies in the midportion of a left-sided duplicated SVC. This is stable. There is calcification in the wall of the aortic arch. The surgical wires are intact. The thoracic vertebral bodies are preserved in height. IMPRESSION: Slight interval deterioration in the appearance of the right hemithorax with slight increased pleural fluid and increased interstitial edema. Stable cardiomegaly. Thoracic aortic atherosclerosis. Electronically Signed   By: David  Martinique M.D.   On: 03/20/2016 13:48   Ct Angio Chest Pe W And/or Wo Contrast  Result Date: 03/19/2016 CLINICAL DATA:  Shortness of breath. History of right breast cancer. EXAM: CT ANGIOGRAPHY CHEST WITH CONTRAST TECHNIQUE: Multidetector CT imaging of the chest was performed using the standard protocol during bolus administration of intravenous contrast. Multiplanar CT image reconstructions and MIPs were obtained to evaluate the vascular anatomy. CONTRAST:  100 mL of Isovue 370 intravenously. COMPARISON:  CT scan of February 18, 2016. FINDINGS: Cardiovascular: 5.5 cm ascending thoracic aortic aneurysm is noted. Aortic atherosclerosis is noted. No dissection is noted. There is no evidence of large central pulmonary embolus. However, due to respiratory motion artifact, smaller peripheral pulmonary emboli in lower branches cannot be excluded on the basis of this exam. Left internal jugular Port-A-Cath  is again noted with distal tip in excess tree azygos vein on the left which potentially flows into right atrium. Enlargement of pulmonary arteries is noted suggesting pulmonary artery hypertension. Mediastinum/Nodes: Right peritracheal adenopathy is noted with largest lymph node measuring 20 x 15 mm. Lungs/Pleura: No pneumothorax is noted. Left lung is unremarkable. Large right pleural effusion is noted with complete atelectasis of right lower lobe. Right upper lobe opacity is noted concerning for pneumonia or atelectasis. Upper Abdomen: Multiple left renal cysts are noted. 2.8 cm left adrenal mass is noted concerning for metastatic disease. Musculoskeletal: No chest wall abnormality. No acute or significant osseous findings. Review of the MIP images confirms the above findings. IMPRESSION: 5.5 cm ascending thoracic aortic aneurysm. Recommend  semi-annual imaging followup by CTA or MRA and referral to cardiothoracic surgery if not already obtained. This recommendation follows 2010 ACCF/AHA/AATS/ACR/ASA/SCA/SCAI/SIR/STS/SVM Guidelines for the Diagnosis and Management of Patients With Thoracic Aortic Disease. Circulation. 2010; 121: [id number]. No evidence of large central pulmonary embolus. However, due to artifact related to respiratory motion, smaller peripheral pulmonary emboli cannot be excluded on the basis of this exam. Enlargement of pulmonary arteries is noted suggesting pulmonary artery hypertension. Right peritracheal adenopathy is noted consistent with metastatic disease. 2.8 cm left adrenal mass is noted concerning for metastatic disease. Electronically Signed   By: Marijo Conception, M.D.   On: 03/19/2016 13:37   Dg Chest Port 1 View  Result Date: 03/21/2016 CLINICAL DATA:  Short of breath at rest EXAM: PORTABLE CHEST 1 VIEW COMPARISON:  03/20/2016 FINDINGS: Vascular congestion is worse. Pulmonary edema is worse. Right pleural effusion is worse. There is airspace edema at the right lung base. Heart  remains enlarged. Left jugular Port-A-Cath tip remains in the left SVC IMPRESSION: Worsening CHF. Electronically Signed   By: Marybelle Killings M.D.   On: 03/21/2016 08:32   Dg Chest Port 1 View  Result Date: 03/19/2016 CLINICAL DATA:  82 year old female post right-sided thoracentesis. Subsequent encounter. EXAM: PORTABLE CHEST 1 VIEW COMPARISON:  03/19/2016 CT and chest x-ray. FINDINGS: No pneumothorax post right-sided thoracentesis. Decrease size of right-sided pleural effusion with small pleural effusion remaining. Cardiomegaly. Central pulmonary vascular prominence. Atypical location of the left central line unchanged. CT detected mediastinal adenopathy not appreciated on present exam. Calcified tortuous aorta.  Please see CT report. IMPRESSION: Post right-sided thoracentesis with decrease in size of right-sided pleural effusion without evidence of pneumothorax. Electronically Signed   By: Genia Del M.D.   On: 03/19/2016 18:22   US Thoracentesis Asp Pleural Space W/img Guide  Result Date: 03/19/2016 INDICATION: Metastatic lung cancer, dyspnea, recurrent right pleural effusion. Request made for therapeutic right thoracentesis. EXAM: ULTRASOUND GUIDED THERAPEUTIC RIGHT THORACENTESIS MEDICATIONS: None. COMPLICATIONS: None immediate. PROCEDURE: An ultrasound guided thoracentesis was thoroughly discussed with the patient and questions answered. The benefits, risks, alternatives and complications were also discussed. The patient understands and wishes to proceed with the procedure. Written consent was obtained. Ultrasound was performed to localize and mark an adequate pocket of fluid in the right chest. The area was then prepped and draped in the normal sterile fashion. 1% Lidocaine was used for local anesthesia. Under ultrasound guidance a Safe-T-Centesis catheter was introduced. Thoracentesis was performed. The catheter was removed and a dressing applied. FINDINGS: A total of approximately 1.3 liters of  light yellow fluid was removed. IMPRESSION: Successful ultrasound guided therapeutic right thoracentesis yielding 1.3 liters of pleural fluid. Read by: Rowe Robert, PA-C Electronically Signed   By: Sandi Mariscal M.D.   On: 03/19/2016 17:48    Assessment/Plan 1. Recurrent right pleural effusion, hx of breast cancer -her INR has corrected to 1.59 today.  She is on a heparin drip which will need to be held 2-3 hrs prior to the procedure. -NPO p MN -recheck INR in am -Risks and Benefits discussed with the patient including bleeding, infection, damage to adjacent structures. All of the patient's questions were answered, patient is agreeable to proceed. Consent signed and in chart.   Thank you for this interesting consult.  I greatly enjoyed meeting Deborah [name] and look forward to participating in their care.  A copy of this report was sent to the requesting provider on this date.  Electronically Signed: Henreitta Cea 03/21/2016, 1:00 PM  I spent a total of 40 Minutes    in face to face in clinical consultation, greater than 50% of which was counseling/coordinating care for recurrent right pleural effusion

## 2016-03-22 ENCOUNTER — Other Ambulatory Visit: Payer: Medicare Other

## 2016-03-22 LAB — BASIC METABOLIC PANEL
Anion gap: 4 — ABNORMAL LOW (ref 5–15)
BUN: 42 mg/dL — AB (ref 6–20)
CHLORIDE: 99 mmol/L — AB (ref 101–111)
CO2: 36 mmol/L — ABNORMAL HIGH (ref 22–32)
Calcium: 10.2 mg/dL (ref 8.9–10.3)
Creatinine, Ser: 0.99 mg/dL (ref 0.44–1.00)
GFR, EST AFRICAN AMERICAN: 58 mL/min — AB (ref >60)
GFR, EST NON AFRICAN AMERICAN: 50 mL/min — AB (ref >60)
Glucose, Bld: 135 mg/dL — ABNORMAL HIGH (ref 65–99)
POTASSIUM: 5 mmol/L (ref 3.5–5.1)
SODIUM: 139 mmol/L (ref 135–145)

## 2016-03-22 LAB — CBC
HEMATOCRIT: 29.6 % — AB (ref 36.0–46.0)
HEMOGLOBIN: 9.3 g/dL — AB (ref 12.0–15.0)
MCH: 32 pg (ref 26.0–34.0)
MCHC: 31.4 g/dL (ref 30.0–36.0)
MCV: 101.7 fL — ABNORMAL HIGH (ref 78.0–100.0)
Platelets: 210 10*3/uL (ref 150–400)
RBC: 2.91 MIL/uL — ABNORMAL LOW (ref 3.87–5.11)
RDW: 20.3 % — ABNORMAL HIGH (ref 11.5–15.5)
WBC: 4.9 10*3/uL (ref 4.0–10.5)

## 2016-03-22 LAB — PROTIME-INR
INR: 1.28
Prothrombin Time: 16.1 seconds — ABNORMAL HIGH (ref 11.4–15.2)

## 2016-03-22 LAB — TYPE AND SCREEN
ABO/RH(D): O POS
ANTIBODY SCREEN: NEGATIVE
Unit division: 0

## 2016-03-22 LAB — HEPARIN LEVEL (UNFRACTIONATED): Heparin Unfractionated: 0.5 IU/mL (ref 0.30–0.70)

## 2016-03-22 MED ORDER — MORPHINE SULFATE (CONCENTRATE) 10 MG/0.5ML PO SOLN: 5.0000 mg | ORAL | Status: DC | PRN

## 2016-03-22 MED ORDER — MORPHINE SULFATE (PF) 2 MG/ML IV SOLN: 1.0000 mg | INTRAVENOUS | 0 refills | Status: AC | PRN

## 2016-03-22 MED ORDER — LORAZEPAM 2 MG/ML IJ SOLN: 1.0000 mg | INTRAMUSCULAR | Status: DC | PRN

## 2016-03-22 MED ORDER — LORAZEPAM 2 MG/ML PO CONC: 1.0000 mg | ORAL | Status: DC | PRN

## 2016-03-22 MED ORDER — LORAZEPAM 2 MG/ML PO CONC: 1.0000 mg | ORAL | 0 refills | Status: DC | PRN

## 2016-03-22 MED ORDER — LORAZEPAM 1 MG PO TABS: 1.0000 mg | ORAL_TABLET | ORAL | 0 refills | Status: DC | PRN

## 2016-03-22 MED ORDER — LORAZEPAM 2 MG/ML PO CONC: 1.0000 mg | ORAL | 0 refills | Status: AC | PRN

## 2016-03-22 MED ORDER — LORAZEPAM 1 MG PO TABS: 1.0000 mg | ORAL_TABLET | ORAL | Status: DC | PRN

## 2016-03-22 MED ORDER — MORPHINE SULFATE (PF) 2 MG/ML IV SOLN
1.0000 mg | INTRAVENOUS | Status: DC | PRN
  Administered 2016-03-22: 1 mg via INTRAVENOUS
  Filled 2016-03-22: qty 1

## 2016-03-22 MED ORDER — MORPHINE SULFATE (CONCENTRATE) 10 MG/0.5ML PO SOLN: 5.0000 mg | ORAL | 0 refills | Status: AC | PRN

## 2016-03-22 MED ORDER — PROMETHAZINE HCL 25 MG RE SUPP
25.0000 mg | Freq: Four times a day (QID) | RECTAL | Status: DC | PRN
  Filled 2016-03-22: qty 1

## 2016-03-22 MED ORDER — PROMETHAZINE HCL 25 MG RE SUPP: 25.0000 mg | Freq: Four times a day (QID) | RECTAL | 0 refills | Status: AC | PRN

## 2016-03-22 MED ORDER — ENOXAPARIN SODIUM 100 MG/ML ~~LOC~~ SOLN
1.5000 mg/kg | SUBCUTANEOUS | Status: DC
  Administered 2016-03-22: 85 mg via SUBCUTANEOUS
  Filled 2016-03-22: qty 1

## 2016-03-22 MED ORDER — MORPHINE SULFATE (PF) 2 MG/ML IV SOLN: 1.0000 mg | INTRAVENOUS | 0 refills | Status: DC | PRN

## 2016-03-22 MED ORDER — ENOXAPARIN SODIUM 150 MG/ML ~~LOC~~ SOLN: 1.5000 mg/kg | SUBCUTANEOUS | 0 refills | Status: AC

## 2016-03-22 MED ORDER — ONDANSETRON HCL 4 MG/2ML IJ SOLN: 4.0000 mg | Freq: Four times a day (QID) | INTRAMUSCULAR | 0 refills | Status: AC | PRN

## 2016-03-22 MED ORDER — LORAZEPAM 2 MG/ML IJ SOLN: 1.0000 mg | INTRAMUSCULAR | 0 refills | Status: DC | PRN

## 2016-03-22 MED ORDER — MORPHINE SULFATE (PF) 2 MG/ML IV SOLN: 1.0000 mg | INTRAVENOUS | Status: DC | PRN

## 2016-03-22 NOTE — Progress Notes (Signed)
Patient ID: Deborah [name], female   DOB: 1927/02/10, 82 y.o.   MRN: [id number]  Patient is transitioning goals of care to comfort only.  She no longer wishes to have Pleurx placed. Will cancel procedure with IR today.   Brynda Greathouse, MS RD PA-C 03/22/16 9:32 AM

## 2016-03-22 NOTE — Progress Notes (Signed)
Report called to Manuela Schwartz at [facility], awaiting transport via [employer]

## 2016-03-22 NOTE — Discharge Summary (Signed)
Physician Discharge Summary  BAY JARQUIN TIW:[id number] DOB: [birth year] DOA: 03/19/2016  PCP: [name], MD  Admit date: 03/19/2016 Discharge date: 03/22/2016  Admitted From: Home Disposition:  SNF- [facility]  Recommendations for Outpatient Follow-up:  1. Discharge with hospice 2. Comfort Care as prescribed  Home Health:No Equipment/Devices: Oxygen as needed  Discharge Condition:Hospice CODE STATUS:DNR Comfort Care Diet recommendation: per patient request  Brief/Interim Summary: Deborah [name] a 82 y.o.femalewith medical history significant of metastatic(T2b, N2, M1b) small cell lung cancer diagnosed in November 2017 S/p palliative radiation to right lung mass and currently forgoing chemo. She also has a h/o mechanical mitral valve and A-fib on Coumadin, AVMs and presumed chronic GI blood loss, Iron deficiency, CHF unspecified, HTN Hypothroid, Gout. She had a blood transfusion a few days ago and has since been increasingly dyspneic at rest and with movement. No chest pain. Mild cough, non productive. Has chronic pedal edema. She is found to have a large pleural effusion. She lasthad a thoracentesis on 11/19. She was admitted with acute Respiratory Failure with Hypoxia 2/2 to a Right Pleural Effusion and under went therapeutic Thoracentesis on 03/19/2016.  However plan was for patient to be placed on heparin in order to prepare for a pleurx catheter placement but on 12/22 patient became increasingly dyspneic and denied the catheter placement; Repeatedly stating at that time she was ready to die. Discussed with patient's daughter that patient may not make it to December 26 to transition to hospice.  Patient asking to be made comfortable and family in agreement.  Transition to comfort based focus.  Discharge to hospice at SNF on 12/22.   Discharge Diagnoses:  Principal Problem:   Acute respiratory failure with hypoxia (HCC) Active Problems:   Hypothyroidism   S/P  aortic valve replacement with metallic valve   Chronic atrial fibrillation (HCC)   Iron deficiency anemia due to chronic blood loss   Essential hypertension   Gout   Chronic GI bleeding   Pleural effusion on right   Small cell lung cancer, right [facility])    Discharge Instructions  Discharge Instructions    Type and screen    Complete by:  Mar 21, 2016    [place] order/instruction    Complete by:  As directed    Transfuse Parameters   Complete patient signature process for consent form    Complete by:  As directed    Practitioner attestation of consent    Complete by:  As directed    I, the ordering practitioner, attest that I have discussed with the patient the benefits, risks, side effects, alternatives, likelihood of achieving goals and potential problems during recovery for the procedure listed.   Procedure:  Blood Product(s)     Allergies as of 03/22/2016      Reactions   Alendronate Sodium Other (See Comments)   GI PAIN CAUTION: RISK FOR PERFORATION   Amiodarone Shortness Of Breath   Chlorthalidone Shortness Of Breath   ABDOMINAL BLOATING   Macrodantin [nitrofurantoin] Nausea And Vomiting, Other (See Comments)   CONTRAINDICATED WITH GFR < 50   Meclizine Swelling   SWELLING OF TONGUE   Norvasc [amlodipine Besylate] Swelling   SWELLING REACTION UNSPECIFIED    Hydralazine Hcl Nausea Only   Sulfa Antibiotics Other (See Comments)   Unknown   Tussionex Pennkinetic Er [hydrocod Polst-cpm Polst Er] Other (See Comments)   UNSPECIFIED REACTION       Medication List    STOP taking  these medications   acetaminophen 500 MG tablet Commonly known as:  TYLENOL   ALLEGRA ALLERGY 180 MG tablet Generic drug:  fexofenadine   allopurinol 300 MG tablet Commonly known as:  ZYLOPRIM   BENTYL 10 MG capsule Generic drug:  dicyclomine   calcium-vitamin D 500 MG tablet   carvedilol 12.5 MG tablet Commonly known as:  COREG   celecoxib 200 MG  capsule Commonly known as:  CELEBREX   dexamethasone 4 MG tablet Commonly known as:  DECADRON   enoxaparin 100 MG/ML injection Commonly known as:  LOVENOX   furosemide 40 MG tablet Commonly known as:  LASIX   hydrOXYzine 25 MG tablet Commonly known as:  ATARAX/VISTARIL   levothyroxine 88 MCG tablet Commonly known as:  SYNTHROID, LEVOTHROID   NITROSTAT 0.4 MG SL tablet Generic drug:  nitroGLYCERIN   potassium chloride SA 20 MEQ tablet Commonly known as:  K-DUR,KLOR-CON   PROBIOTIC ADVANCED PO   simvastatin 20 MG tablet Commonly known as:  ZOCOR   traMADol 50 MG tablet Commonly known as:  ULTRAM   valsartan 160 MG tablet Commonly known as:  DIOVAN   warfarin 5 MG tablet Commonly known as:  COUMADIN   zolpidem 12.5 MG CR tablet Commonly known as:  AMBIEN CR     TAKE these medications   LORazepam 0.5 MG tablet Commonly known as:  ATIVAN Take 1 tablet (0.5 mg total) by mouth every 8 (eight) hours as needed for anxiety. What changed:  Another medication with the same name was added. Make sure you understand how and when to take each.   LORazepam 1 MG tablet Commonly known as:  ATIVAN Take 1 tablet (1 mg total) by mouth every 4 (four) hours as needed for anxiety. What changed:  You were already taking a medication with the same name, and this prescription was added. Make sure you understand how and when to take each.   LORazepam 2 MG/ML concentrated solution Commonly known as:  ATIVAN Place 0.5 mLs (1 mg total) under the tongue every 4 (four) hours as needed for anxiety. What changed:  You were already taking a medication with the same name, and this prescription was added. Make sure you understand how and when to take each.   LORazepam 2 MG/ML injection Commonly known as:  ATIVAN Inject 0.5 mLs (1 mg total) into the vein every 4 (four) hours as needed for anxiety. What changed:  You were already taking a medication with the same name, and this prescription was  added. Make sure you understand how and when to take each.   morphine 2 MG/ML injection Inject 0.5 mLs (1 mg total) into the vein every 2 (two) hours as needed (or dyspnea).   morphine CONCENTRATE 10 MG/0.5ML Soln concentrated solution Take 0.25 mLs (5 mg total) by mouth every 2 (two) hours as needed for moderate pain (or dyspnea).   morphine CONCENTRATE 10 MG/0.5ML Soln concentrated solution Place 0.25 mLs (5 mg total) under the tongue every 2 (two) hours as needed for moderate pain (or dyspnea).   ondansetron 4 MG/2ML Soln injection Commonly known as:  ZOFRAN Inject 2 mLs (4 mg total) into the vein every 6 (six) hours as needed for nausea.   OXYGEN Inhale 3 L into the lungs continuous.       Allergies  Allergen Reactions  . Alendronate Sodium Other (See Comments)    GI PAIN CAUTION: RISK FOR PERFORATION  . Amiodarone Shortness Of Breath  . Chlorthalidone Shortness Of Breath  ABDOMINAL BLOATING  . Macrodantin [Nitrofurantoin] Nausea And Vomiting and Other (See Comments)    CONTRAINDICATED WITH GFR < 50  . Meclizine Swelling    SWELLING OF TONGUE  . Norvasc [Amlodipine Besylate] Swelling    SWELLING REACTION UNSPECIFIED   . Hydralazine Hcl Nausea Only  . Sulfa Antibiotics Other (See Comments)    Unknown  . Tussionex Pennkinetic Er [Hydrocod Polst-Cpm Polst Er] Other (See Comments)    UNSPECIFIED REACTION     Consultations:  Hematology  Care Management   Procedures/Studies: Dg Chest 2 View  Result Date: 03/20/2016 CLINICAL DATA:  Shortness of breath. History of pleural effusions with right thoracentesis. History of lung malignancy. EXAM: CHEST  2 VIEW COMPARISON:  Portable chest x-ray of March 19, 2016 FINDINGS: The lungs are slightly less well inflated today. The volume of pleural fluid on the right is slightly increased. There is a trace left pleural effusion. The cardiac silhouette remains enlarged. The pulmonary vascularity remains mildly engorged  centrally. The power port catheter tip lies in the midportion of a left-sided duplicated SVC. This is stable. There is calcification in the wall of the aortic arch. The surgical wires are intact. The thoracic vertebral bodies are preserved in height. IMPRESSION: Slight interval deterioration in the appearance of the right hemithorax with slight increased pleural fluid and increased interstitial edema. Stable cardiomegaly. Thoracic aortic atherosclerosis. Electronically Signed   By: David  Martinique M.D.   On: 03/20/2016 13:48   Dg Chest 2 View  Result Date: 03/19/2016 CLINICAL DATA:  Increased shortness of breath EXAM: CHEST  2 VIEW COMPARISON:  03/18/2016 FINDINGS: There is moderate right pleural effusion. There is a left pleural effusion. There is no pneumothorax. There is no focal consolidation. There is stable cardiomediastinal silhouette. There is a persistent right hilar soft tissue mass. There is evidence of prior CABG. There is a Port-A-Cath with its tip projecting over expected duplicated left SVC. The osseous structures are unremarkable. IMPRESSION: 1. Persistent right hilar soft tissue mass. Moderate right pleural effusion. Electronically Signed   By: Kathreen Devoid   On: 03/19/2016 10:56   Dg Chest 2 View  Result Date: 03/18/2016 CLINICAL DATA:  History of lung cancer and breast cancer. EXAM: CHEST  2 VIEW COMPARISON:  03/07/2016. FINDINGS: PowerPort catheter in stable position with tip projected over the left mediastinum consistent left-sided SVC. Prior cardiac valve surgery. Cardiomegaly. Mild bilateral from interstitial prominence and pleural effusions consistent CHF. No pneumothorax. Surgical clips right axilla. IMPRESSION: 1. PowerPort catheter noted with tip projected over superior vena cava. 2. Prior cardiac valve surgery. Congestive heart failure with pulmonary interstitial edema and small right pleural effusion . Electronically Signed   By: [name]  Register   On: 03/18/2016 14:29   Dg  Chest 2 View  Result Date: 03/07/2016 CLINICAL DATA:  Shortness of breath. EXAM: CHEST  2 VIEW COMPARISON:  February 18, 2016 FINDINGS: Stable left Port-A-Cath. No pneumothorax. There is a small right pleural effusion which is larger in the interval. Increased interstitial opacities suggest mild edema. No other interval changes. IMPRESSION: 1. Small right pleural effusion, larger in the interval. 2. Suspected mild edema. Electronically Signed   By: Dorise Bullion III M.D   On: 03/07/2016 13:48   Ct Angio Chest Pe W And/or Wo Contrast  Result Date: 03/19/2016 CLINICAL DATA:  Shortness of breath. History of right breast cancer. EXAM: CT ANGIOGRAPHY CHEST WITH CONTRAST TECHNIQUE: Multidetector CT imaging of the chest was performed using the standard protocol during bolus administration of  intravenous contrast. Multiplanar CT image reconstructions and MIPs were obtained to evaluate the vascular anatomy. CONTRAST:  100 mL of Isovue 370 intravenously. COMPARISON:  CT scan of February 18, 2016. FINDINGS: Cardiovascular: 5.5 cm ascending thoracic aortic aneurysm is noted. Aortic atherosclerosis is noted. No dissection is noted. There is no evidence of large central pulmonary embolus. However, due to respiratory motion artifact, smaller peripheral pulmonary emboli in lower branches cannot be excluded on the basis of this exam. Left internal jugular Port-A-Cath is again noted with distal tip in excess tree azygos vein on the left which potentially flows into right atrium. Enlargement of pulmonary arteries is noted suggesting pulmonary artery hypertension. Mediastinum/Nodes: Right peritracheal adenopathy is noted with largest lymph node measuring 20 x 15 mm. Lungs/Pleura: No pneumothorax is noted. Left lung is unremarkable. Large right pleural effusion is noted with complete atelectasis of right lower lobe. Right upper lobe opacity is noted concerning for pneumonia or atelectasis. Upper Abdomen: Multiple left renal  cysts are noted. 2.8 cm left adrenal mass is noted concerning for metastatic disease. Musculoskeletal: No chest wall abnormality. No acute or significant osseous findings. Review of the MIP images confirms the above findings. IMPRESSION: 5.5 cm ascending thoracic aortic aneurysm. Recommend semi-annual imaging followup by CTA or MRA and referral to cardiothoracic surgery if not already obtained. This recommendation follows 2010 ACCF/AHA/AATS/ACR/ASA/SCA/SCAI/SIR/STS/SVM Guidelines for the Diagnosis and Management of Patients With Thoracic Aortic Disease. Circulation. 2010; 121: e266-e369TAA. No evidence of large central pulmonary embolus. However, due to artifact related to respiratory motion, smaller peripheral pulmonary emboli cannot be excluded on the basis of this exam. Enlargement of pulmonary arteries is noted suggesting pulmonary artery hypertension. Right peritracheal adenopathy is noted consistent with metastatic disease. 2.8 cm left adrenal mass is noted concerning for metastatic disease. Electronically Signed   By: Marijo Conception, M.D.   On: 03/19/2016 13:37   Dg Chest Port 1 View  Result Date: 03/21/2016 CLINICAL DATA:  Short of breath at rest EXAM: PORTABLE CHEST 1 VIEW COMPARISON:  03/20/2016 FINDINGS: Vascular congestion is worse. Pulmonary edema is worse. Right pleural effusion is worse. There is airspace edema at the right lung base. Heart remains enlarged. Left jugular Port-A-Cath tip remains in the left SVC IMPRESSION: Worsening CHF. Electronically Signed   By: Marybelle Killings M.D.   On: 03/21/2016 08:32   Dg Chest Port 1 View  Result Date: 03/19/2016 CLINICAL DATA:  82 year old female post right-sided thoracentesis. Subsequent encounter. EXAM: PORTABLE CHEST 1 VIEW COMPARISON:  03/19/2016 CT and chest x-ray. FINDINGS: No pneumothorax post right-sided thoracentesis. Decrease size of right-sided pleural effusion with small pleural effusion remaining. Cardiomegaly. Central pulmonary vascular  prominence. Atypical location of the left central line unchanged. CT detected mediastinal adenopathy not appreciated on present exam. Calcified tortuous aorta.  Please see CT report. IMPRESSION: Post right-sided thoracentesis with decrease in size of right-sided pleural effusion without evidence of pneumothorax. Electronically Signed   By: Genia Del M.D.   On: 03/19/2016 18:22   US Thoracentesis Asp Pleural Space W/img Guide  Result Date: 03/19/2016 INDICATION: Metastatic lung cancer, dyspnea, recurrent right pleural effusion. Request made for therapeutic right thoracentesis. EXAM: ULTRASOUND GUIDED THERAPEUTIC RIGHT THORACENTESIS MEDICATIONS: None. COMPLICATIONS: None immediate. PROCEDURE: An ultrasound guided thoracentesis was thoroughly discussed with the patient and questions answered. The benefits, risks, alternatives and complications were also discussed. The patient understands and wishes to proceed with the procedure. Written consent was obtained. Ultrasound was performed to localize and mark an adequate pocket of fluid in the  right chest. The area was then prepped and draped in the normal sterile fashion. 1% Lidocaine was used for local anesthesia. Under ultrasound guidance a Safe-T-Centesis catheter was introduced. Thoracentesis was performed. The catheter was removed and a dressing applied. FINDINGS: A total of approximately 1.3 liters of light yellow fluid was removed. IMPRESSION: Successful ultrasound guided therapeutic right thoracentesis yielding 1.3 liters of pleural fluid. Read by: Rowe Robert, PA-C Electronically Signed   By: Sandi Mariscal M.D.   On: 03/19/2016 17:48       Subjective: Patient working hard to breath.  Repeatedly states she wants to be left alone and is "ready to go see her husband".  Daughter is bedside.  Understands and agrees to transfer to hospice today.   Discharge Exam: Vitals:   03/22/16 0749 03/22/16 0800  BP:    Pulse:    Resp:  (!) 33  Temp: 97.8 F  (36.6 C)    Vitals:   03/22/16 0600 03/22/16 0630 03/22/16 0749 03/22/16 0800  BP: (!) 156/115 (!) 149/81    Pulse:      Resp: (!) 21 (!) 23  (!) 33  Temp:   97.8 F (36.6 C)   TempSrc:   Axillary   SpO2: 91% 97%  94%  Weight:      Height:        General: Pt is alert, awake, significant distress Cardiovascular: RRR, S1/S2 +, no rubs, no gallops Respiratory: decreased breath sounds on the right and left, tachypnea, rhonchi on left Abdominal: Soft, NT, ND, bowel sounds + Extremities: no edema, no cyanosis    The results of significant diagnostics from this hospitalization (including imaging, microbiology, ancillary and laboratory) are listed below for reference.     Microbiology: Recent Results (from the past 240 hour(s))  MRSA PCR Screening     Status: None   Collection Time: 03/19/16  4:03 PM  Result Value Ref Range Status   MRSA by PCR NEGATIVE NEGATIVE Final    Comment:        The GeneXpert MRSA Assay (FDA approved for NASAL specimens only), is one component of a comprehensive MRSA colonization surveillance program. It is not intended to diagnose MRSA infection nor to guide or monitor treatment for MRSA infections.      Labs: BNP (last 3 results)  Recent Labs  12/27/15 1221 01/10/16 1338 02/12/16 0927  BNP 279.2* 353.2* 448.1*   Basic Metabolic Panel:  Recent Labs Lab 03/18/16 1037 03/19/16 1107 03/20/16 0402 03/21/16 0330 03/22/16 0101  NA 141 141 137 138 139  K 4.8 5.1 4.9 4.7 5.0  CL  --  100* 99* 99* 99*  CO2 37* 37* 35* 36* 36*  GLUCOSE 108 128* 117* 128* 135*  BUN 56.8* 58* 56* 49* 42*  CREATININE 1.0 0.95 1.04* 0.97 0.99  CALCIUM 9.9 9.6 9.5 9.5 10.2  MG  --   --   --  2.3  --   PHOS  --   --   --  3.4  --    Liver Function Tests:  Recent Labs Lab 03/18/16 1037 03/19/16 1107 03/21/16 0330  AST 14 75* 35  ALT 19 85* 81*  ALKPHOS 58 57 49  BILITOT <0.22 0.4 0.5  PROT 5.4* 5.1* 4.7*  ALBUMIN 2.8* 3.1* 2.6*    Recent  Labs Lab 03/19/16 1107  LIPASE 33   No results for input(s): AMMONIA in the last 168 hours. CBC:  Recent Labs Lab 03/15/16 1131 03/18/16 1037 03/19/16 1107 03/20/16 0402 03/21/16  0330 03/22/16 0101  WBC 3.2* 3.6* 4.8 6.6 4.9 4.9  NEUTROABS 2.3 2.7 3.8  --  3.7  --   HGB 8.4* 9.0* 8.5* 8.7* 8.0* 9.3*  HCT 26.8* 29.6* 27.1* 28.1* 26.5* 29.6*  MCV 103.9* 103.9* 101.5* 106.8* 107.3* 101.7*  PLT 119* 177 170 227 216 210   Cardiac Enzymes: No results for input(s): CKTOTAL, CKMB, CKMBINDEX, TROPONINI in the last 168 hours. BNP: Invalid input(s): POCBNP CBG: No results for input(s): GLUCAP in the last 168 hours. D-Dimer No results for input(s): DDIMER in the last 72 hours. Hgb A1c No results for input(s): HGBA1C in the last 72 hours. Lipid Profile No results for input(s): CHOL, HDL, LDLCALC, TRIG, CHOLHDL, LDLDIRECT in the last 72 hours. Thyroid function studies No results for input(s): TSH, T4TOTAL, T3FREE, THYROIDAB in the last 72 hours.  Invalid input(s): FREET3 Anemia work up No results for input(s): VITAMINB12, FOLATE, FERRITIN, TIBC, IRON, RETICCTPCT in the last 72 hours. Urinalysis    Component Value Date/Time   COLORURINE YELLOW 03/19/2016 1038   APPEARANCEUR CLEAR 03/19/2016 1038   LABSPEC 1.013 03/19/2016 1038   LABSPEC 1.010 03/22/2015 0917   PHURINE 5.0 03/19/2016 1038   GLUCOSEU NEGATIVE 03/19/2016 1038   GLUCOSEU Negative 03/22/2015 0917   HGBUR NEGATIVE 03/19/2016 1038   BILIRUBINUR NEGATIVE 03/19/2016 1038   BILIRUBINUR Negative 03/22/2015 0917   KETONESUR NEGATIVE 03/19/2016 1038   PROTEINUR NEGATIVE 03/19/2016 1038   UROBILINOGEN 0.2 03/22/2015 0917   NITRITE NEGATIVE 03/19/2016 1038   LEUKOCYTESUR NEGATIVE 03/19/2016 1038   LEUKOCYTESUR Negative 03/22/2015 0917   Sepsis Labs Invalid input(s): PROCALCITONIN,  WBC,  LACTICIDVEN Microbiology Recent Results (from the past 240 hour(s))  MRSA PCR Screening     Status: None   Collection Time:  03/19/16  4:03 PM  Result Value Ref Range Status   MRSA by PCR NEGATIVE NEGATIVE Final    Comment:        The GeneXpert MRSA Assay (FDA approved for NASAL specimens only), is one component of a comprehensive MRSA colonization surveillance program. It is not intended to diagnose MRSA infection nor to guide or monitor treatment for MRSA infections.      Time coordinating discharge: Over 30 minutes  SIGNED:   Loretha Stapler, MD  [facility] 03/22/2016, 11:23 AM Pager [phone] If 7PM-7AM, please contact night-coverage [email or web address] Password [email or web address]

## 2016-03-22 NOTE — Progress Notes (Signed)
ANTICOAGULATION CONSULT NOTE - Follow Up Consult  Pharmacy Consult for Heparin Indication: mechanical AVR--warfarin bridge  Allergies  Allergen Reactions  . Alendronate Sodium Other (See Comments)    GI PAIN CAUTION: RISK FOR PERFORATION  . Amiodarone Shortness Of Breath  . Chlorthalidone Shortness Of Breath    ABDOMINAL BLOATING  . Macrodantin [Nitrofurantoin] Nausea And Vomiting and Other (See Comments)    CONTRAINDICATED WITH GFR < 50  . Meclizine Swelling    SWELLING OF TONGUE  . Norvasc [Amlodipine Besylate] Swelling    SWELLING REACTION UNSPECIFIED   . Hydralazine Hcl Nausea Only  . Sulfa Antibiotics Other (See Comments)    Unknown  . Tussionex Pennkinetic Er [Hydrocod Polst-Cpm Polst Er] Other (See Comments)    UNSPECIFIED REACTION     Patient Measurements: Height: 5' (152.4 cm) Weight: 128 lb 8.5 oz (58.3 kg) IBW/kg (Calculated) : 45.5 Heparin Dosing Weight:   Vital Signs: Temp: 98.1 F (36.7 C) (12/21 2327) Temp Source: Oral (12/21 2327) BP: 122/54 (12/21 2320) Pulse Rate: 88 (12/21 2000)  Labs:  Recent Labs  03/20/16 0402 03/21/16 0330 03/21/16 1430 03/22/16 0101  HGB 8.7* 8.0*  --  9.3*  HCT 28.1* 26.5*  --  29.6*  PLT 227 216  --  210  LABPROT 26.5* 19.1*  --  16.1*  INR 2.38 1.59  --  1.28  HEPARINUNFRC  --   --  0.17* 0.50  CREATININE 1.04* 0.97  --  0.99    Estimated Creatinine Clearance: 32 mL/min (by C-G formula based on SCr of 0.99 mg/dL).   Medications:  Infusions:  . heparin 950 Units/hr (03/21/16 2000)    Assessment: Patient with heparin level at goal.  No heparin issues noted.  Goal of Therapy:  Heparin level 0.3-0.7 units/ml Monitor platelets by anticoagulation protocol: Yes   Plan:  Continue heparin drip at current rate Recheck level at 0900  [name], [name] 03/22/2016,2:22 AM

## 2016-03-22 NOTE — Progress Notes (Signed)
Chaplain introduced spiritual care as resource while rounding on unit.    Nurse secretary reports that family has been present at bedside.  They are hopeful to transition to [place] today with Hospice.    Family appreciative of chaplain presence, voice no needs at this time.  Chaplain provided education around how to contact if needs arise.       Deborah [name], Deborah [name]

## 2016-03-22 NOTE — Progress Notes (Signed)
LCSWA met with patient son and family at bedside and discussed pt. disposition. Patient is to transport to [facility] today with hospice. LCSWA confirmed facility received fl2, will fax d/c summary when available.  Transport form completed. DNR placed. Waiting on o2 to be delivered to facility. Kim at [facility] will call when it has arrived and patient will then transport.   Kathrin Greathouse, Latanya Presser, MSW Clinical Social Worker 5E and Psychiatric Service Line [phone] 03/22/2016  11:06 AM

## 2016-03-22 NOTE — NC FL2 (Signed)
[place] LEVEL OF CARE SCREENING TOOL     IDENTIFICATION  Patient Name: Deborah [name] Birthdate: 12-Jul-1926 Sex: female Admission Date (Current Location): 03/19/2016  [facility] and [place] Number:  Herbalist and Address:  [facility],  [place] [address], [place]      Provider Number: [id number]  Attending Physician Name and Address:  Eber Jones, MD  Relative Name and Phone Number:       Current Level of Care: Hospital Recommended Level of Care: [place] Prior Approval Number:    Date Approved/Denied:   PASRR Number: [id number]  Discharge Plan: SNF    Current Diagnoses: Patient Active Problem List   Diagnosis Date Noted  . Acute respiratory failure with hypoxia ([place]) 03/19/2016  . Dyspnea 02/18/2016  . Malignant neoplasm of hilus of right lung ([place]) 02/18/2016  . Small cell lung cancer, right ([place]) 02/16/2016  . Chest pain 02/16/2016  . History of breast cancer 02/16/2016  . Hemoptysis   . Mediastinal lymphadenopathy   . Lung cancer, primary, with metastasis from lung to other site, right ([place]) 02/06/2016  . Ascending aortic aneurysm ([place]) 01/24/2016  . Pleural effusion on right 01/11/2016  . AVM (arteriovenous malformation) 01/11/2016  . S/P thoracentesis   . Shortness of breath 01/10/2016  . Acute CHF (congestive heart failure) ([place]) 01/10/2016  . Lung mass   . Chronic GI bleeding 12/27/2014  . Cerebral thrombosis with cerebral infarction ([place]) 12/26/2014  . TIA (transient ischemic attack)   . Dysarthria 12/25/2014  . HLD (hyperlipidemia) 12/25/2014  . Essential hypertension 12/25/2014  . Gout 12/25/2014  . GERD (gastroesophageal reflux disease) 12/25/2014  . Normocytic anemia due to blood loss   . Rectus sheath hematoma 12/19/2014  . Acute blood loss anemia 12/19/2014  . Hematoma 12/19/2014  . Pain 06/14/2014  . Poor venous access   . Melena 05/19/2013  . Coagulopathy ([place])  01/27/2013  . Iron deficiency anemia due to chronic blood loss 07/24/2012  . Malaise and fatigue 05/21/2012  . Dyslipidemia 12/04/2011  . Chronic atrial fibrillation ([place]) 09/06/2011  . Long term current use of anticoagulant therapy 08/08/2011  . Nonsustained ventricular tachycardia ([place]) 07/21/2011  . Hypokalemia 07/20/2011  . Hyponatremia 07/20/2011  . Bronchitis 07/20/2011  . S/P aortic valve replacement with metallic valve 81/04/7508  . Hypothyroidism   . Benign hypertensive heart disease without heart failure     Orientation RESPIRATION BLADDER Height & Weight     Self  O2 at 6L Continent Weight: 128 lb 8.5 oz (58.3 kg) Height:  5' (152.4 cm)  BEHAVIORAL SYMPTOMS/MOOD NEUROLOGICAL BOWEL NUTRITION STATUS   (none )  (none ) Continent Diet (NPO sips with meds )  AMBULATORY STATUS COMMUNICATION OF NEEDS Skin   Total Care Verbally Normal                       Personal Care Assistance Level of Assistance  Bathing, Feeding, Dressing, Total care Bathing Assistance: Maximum assistance Feeding assistance: Maximum assistance Dressing Assistance: Maximum assistance Total Care Assistance: Maximum assistance   Functional Limitations Info  Speech, Hearing, Sight Sight Info: Adequate Hearing Info: Adequate Speech Info: Adequate    SPECIAL CARE FACTORS FREQUENCY                       Contractures      Additional Factors Info  Code Status, Allergies Code Status Info: DNR CODE  Allergies Info: Alendronate Sodium,  Amiodarone, Chlorthalidone, Macrodantin Nitrofurantoin, Meclizine, Norvasc Amlodipine Besylate, Hydralazine Hcl, Sulfa Antibiotics, Tussionex Pennkinetic Er Hydrocod Polst-cpm Polst Er           Current Medications (03/22/2016):  This is the current hospital active medication list Current Facility-Administered Medications  Medication Dose Route Frequency Provider Last Rate Last Dose  . 0.9 %  sodium chloride infusion  250 mL Intravenous Once [name], MD      . acetaminophen (TYLENOL) tablet 650 mg  650 mg Oral Q6H PRN Debbe Odea, MD       Or  . acetaminophen (TYLENOL) suppository 650 mg  650 mg Rectal Q6H PRN Debbe Odea, MD      . carvedilol (COREG) tablet 12.5 mg  12.5 mg Oral BID WC Debbe Odea, MD   12.5 mg at 03/22/16 0748  . celecoxib (CELEBREX) capsule 200 mg  200 mg Oral Daily PRN Debbe Odea, MD      . dicyclomine (BENTYL) capsule 10 mg  10 mg Oral Daily PRN Debbe Odea, MD      . hydrOXYzine (ATARAX/VISTARIL) tablet 25 mg  25 mg Oral TID PRN [name], MD   25 mg at 03/22/16 0748  . levothyroxine (SYNTHROID, LEVOTHROID) tablet 88 mcg  88 mcg Oral QAC breakfast Debbe Odea, MD   88 mcg at 03/22/16 0748  . LORazepam (ATIVAN) tablet 1 mg  1 mg Oral Q4H PRN Eber Jones, MD       Or  . LORazepam (ATIVAN) 2 MG/ML concentrated solution 1 mg  1 mg Sublingual Q4H PRN Eber Jones, MD       Or  . LORazepam (ATIVAN) injection 1 mg  1 mg Intravenous Q4H PRN Eber Jones, MD      . MEDLINE mouth rinse  15 mL Mouth Rinse BID Debbe Odea, MD   15 mL at 03/21/16 1000  . morphine 2 MG/ML injection 1 mg  1 mg Intravenous Q2H PRN Eber Jones, MD   1 mg at 03/22/16 0100  . morphine CONCENTRATE 10 MG/0.5ML oral solution 5 mg  5 mg Oral Q2H PRN Eber Jones, MD       Or  . morphine CONCENTRATE 10 MG/0.5ML oral solution 5 mg  5 mg Sublingual Q2H PRN Eber Jones, MD      . ondansetron [facility]) tablet 4 mg  4 mg Oral Q6H PRN Debbe Odea, MD       Or  . ondansetron (ZOFRAN) injection 4 mg  4 mg Intravenous Q6H PRN Debbe Odea, MD   4 mg at 03/21/16 6948  . sodium chloride flush (NS) 0.9 % injection 10 mL  10 mL Intracatheter PRN [name], MD      . sodium chloride flush (NS) 0.9 % injection 3 mL  3 mL Intracatheter PRN [name], MD      . traMADol [facility]) tablet 50 mg  50 mg Oral Q6H PRN Debbe Odea, MD         Discharge Medications: Please see  discharge summary for a list of discharge medications.  Relevant Imaging Results:  Relevant Lab Results:   Additional Information SSN [id number] returning as comfort care patient   Rozell Searing

## 2016-03-22 NOTE — Progress Notes (Signed)
Spoke with pt's son [name]) and daughter's [name], Gay Filler) at bedside concerning Home with Hospice.  [name] had already called [facility].  A call was mad to Abigail Butts, Therapist, sports in house rep with [facility]. Abigail Butts, RN spoke with the family concerning home with Hospice. Pt's is now going to [facility]. CSW will follow.

## 2016-03-22 NOTE — Progress Notes (Signed)
Facility has received the 6L o2, pt. Can d/c when d/c summary is completed

## 2016-03-22 NOTE — Progress Notes (Signed)
[facility] Liaison  Notified by [place], [facility] of family request for Hospice and [place] services at home after discharge.    Spoke with patient's children, Jomarie Longs and [name] to initiate education related to hospice philosophy, services and team approach to care.  Per discussion, plan if for patient to discharge by transport today, to [facility].    Please send signed, completed DNR form with patient. Patient will need prescriptions for discharge comfort medications.    [facility] is managing dme needs.  I have spoken with Gerrit Heck Coordinator at [facility].    [facility] is aware of the above.    Please send completed discharge summary to [place] at [phone] when final.   Please notify [facility] when patient leaves the unit at discharge--call [phone].  Above information shared with Cookie, CMRN.   Please call with questions.  Thank you, Efrain Sella, RN, BSN [facility] Liaisons are now on Amion.  You can call me directly at [phone] before 5 pm today.  Our 24 hour number is [phone].

## 2016-03-22 NOTE — Progress Notes (Signed)
   03/22/16 1300  Clinical Encounter Type  Visited With Family  Visit Type Initial;Psychological support;Spiritual support;Critical Care  Referral From Nurse  Consult/Referral To Chaplain  Spiritual Encounters  Spiritual Needs Emotional;Other (Comment);Grief support Special educational needs teacher)  Stress Factors  Patient Stress Factors Not reviewed  Family Stress Factors Loss;Other (Comment)   I visited with the patient's family due to the patient's condition.  The patient's son and friend were at the bedside. The patient's son stated that they were anxious for discharge to a hospice facility and that they just don't want the patient to be in pain.  The patient's son states that they have good support from their Tax adviser.   Please, contact Spiritual Care for further assistance.   [place] M.Div.

## 2016-03-22 NOTE — Progress Notes (Signed)
I met w Hospice intake RN and patient's family. The plan is trans fer to [facility] rehab section today. The Hospice defining illness is small cell lung cancer. Patient has a DNR in place and is not expected to live more than a few days.   We are going to continue lovenox daily at 85 mg sQ to prevent a stroke/seizure as terminal even.ert. This may cause GIB but that is not expected to cause the patient any discomfort. We are going to use morphine liquid PRN and O2 for her SOB. She may need a morphine pump at some point.  I have simplified her meds and she should be discharged with the current MAR, as just corrected by me. I have discussed plans with Manuela Schwartz at [facility] in the Rehab section-- they are expecting the patient this PM [phone])  I appreciate your help to this patient and her family!

## 2016-03-26 ENCOUNTER — Other Ambulatory Visit: Payer: Medicare Other

## 2016-03-29 ENCOUNTER — Other Ambulatory Visit: Payer: Medicare Other

## 2016-03-29 ENCOUNTER — Ambulatory Visit: Payer: Medicare Other

## 2016-04-01 DEATH — deceased

## 2016-04-02 ENCOUNTER — Other Ambulatory Visit: Payer: Medicare Other

## 2016-04-05 ENCOUNTER — Ambulatory Visit: Payer: Medicare Other

## 2016-04-05 ENCOUNTER — Other Ambulatory Visit: Payer: Medicare Other

## 2016-04-08 ENCOUNTER — Other Ambulatory Visit: Payer: Medicare Other

## 2016-04-10 ENCOUNTER — Ambulatory Visit: Payer: Medicare Other | Admitting: Internal Medicine

## 2016-04-12 ENCOUNTER — Ambulatory Visit: Payer: Medicare Other

## 2016-04-12 ENCOUNTER — Other Ambulatory Visit: Payer: Medicare Other

## 2016-04-29 ENCOUNTER — Ambulatory Visit: Payer: Medicare Other | Admitting: Cardiovascular Disease

## 2016-04-30 ENCOUNTER — Ambulatory Visit: Payer: Self-pay | Admitting: Radiation Oncology

## 2016-05-02 DEATH — deceased

## 2016-07-16 ENCOUNTER — Ambulatory Visit: Payer: Medicare Other | Admitting: Oncology

## 2016-07-16 ENCOUNTER — Other Ambulatory Visit: Payer: Medicare Other

## 2017-05-16 NOTE — Progress Notes (Signed)
This encounter was created in error - please disregard.

## 2018-02-10 IMAGING — CT NM PET TUM IMG INITIAL (PI) SKULL BASE T - THIGH
8 series · 18 of 25 positions shown · non-contrast
Comparison: 01/10/2016 chest CT angiogram. 08/05/2015 CT
abdomen/pelvis.

CLINICAL DATA: Initial treatment strategy for right upper lobe lung
mass detected on recent chest CT angiogram performed in the setting
of chest tightness and shortness of breath. History of breast
cancer.

EXAM:
NUCLEAR MEDICINE PET SKULL BASE TO THIGH
TECHNIQUE: 5.8 mCi F-18 FDG was injected intravenously. Full-ring PET imaging
was performed from the skull base to thigh after the radiotracer. CT
data was obtained and used for attenuation correction and anatomic
localization.
FASTING BLOOD GLUCOSE:  Value: 109 mg/dl

[Series 3: pet sk_thigh ac · axial · 5.0mm · 4.07mm/px · z∈[-1254,-494]mm · 4 of 191 slices shown]
[im 1/191]
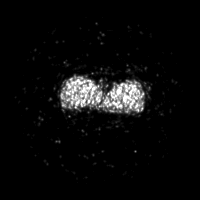
[im 96/191]
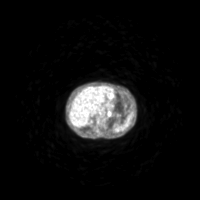
[im 143/191]
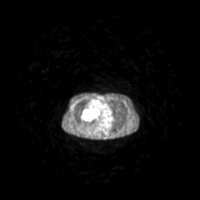
[im 191/191]
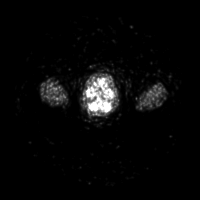

[Series 4: ct sk_thigh 5.0 b31f · axial · 5.0mm · 0.84mm/px · z∈[-1066,-494]mm · 3 of 191 slices shown]
[im 48/191]
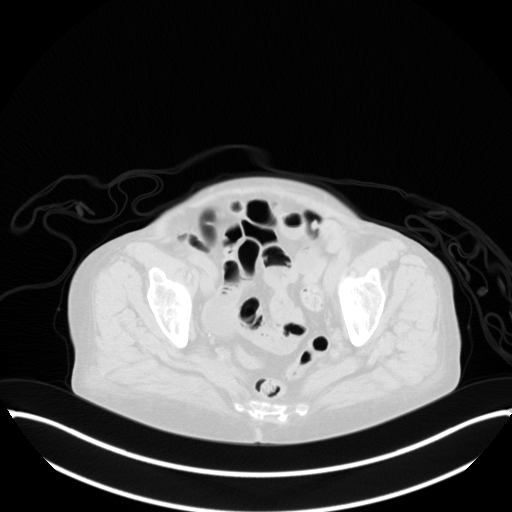
[im 96/191]
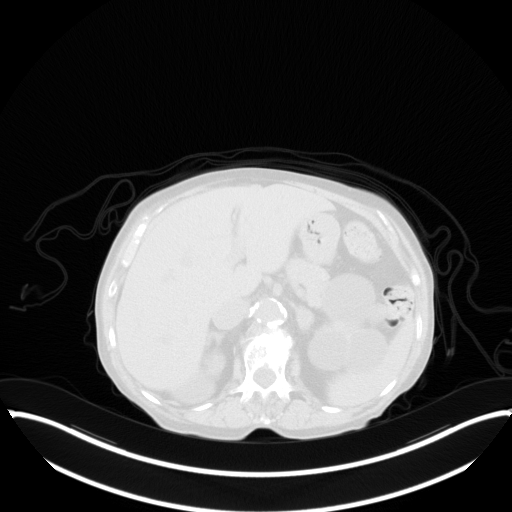
[im 191/191  brain]
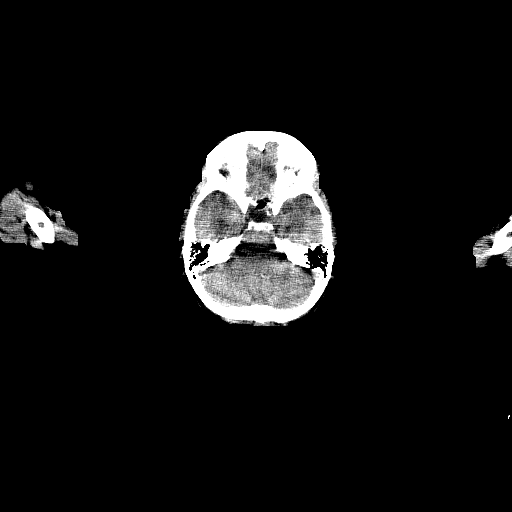

[Series 7: pet sk_thigh nac · axial · 5.0mm · 4.07mm/px · z∈[-1254,-494]mm · 4 of 191 slices shown]
[im 1/191]
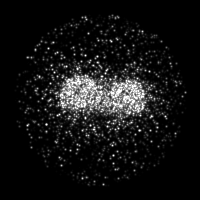
[im 48/191]
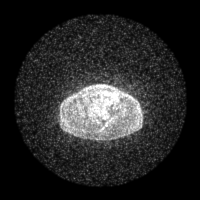
[im 143/191]
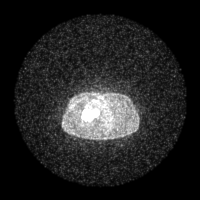
[im 191/191]
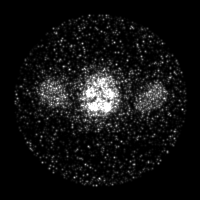

[Series 8: ct sk_thigh 5.0 b70f (id)_bone · axial · 5.0mm · 0.62mm/px · 1 of 75 slices shown]
[im 1/75  bone]
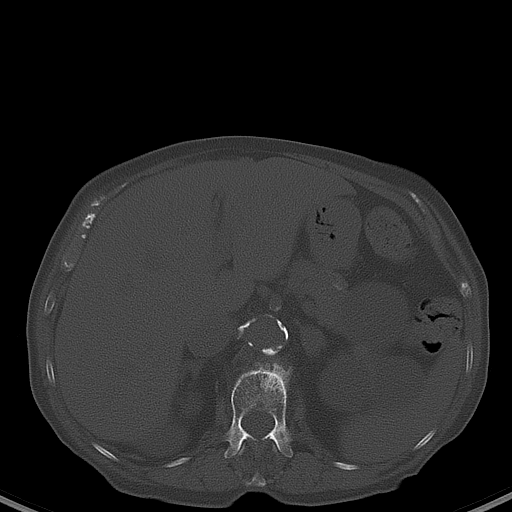

[Series 604: range-ct sk_thigh 5.0 (id)<alpha range> · 1 of 50 slices shown (1 of 2)]
[im 1/50]
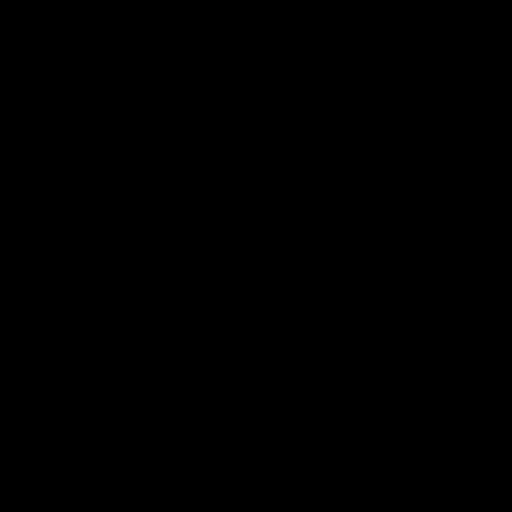

[Series 605: mip collection · coronal · 1.68mm/px · 1 of 32 slices shown]
[im 1/32]
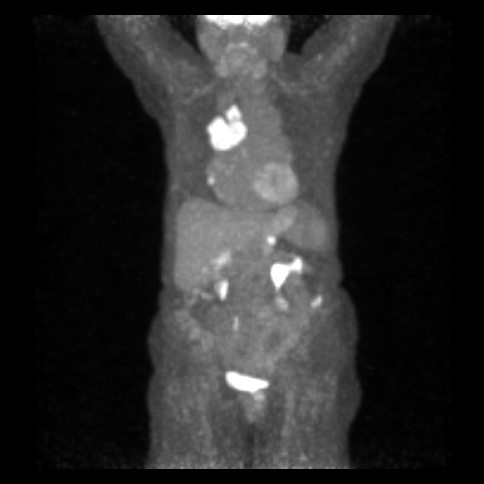

[Series 606: range-ct sk_thigh 5.0 (id)<alpha range> · 3 of 186 slices shown (2 of 2)]
[im 47/186]
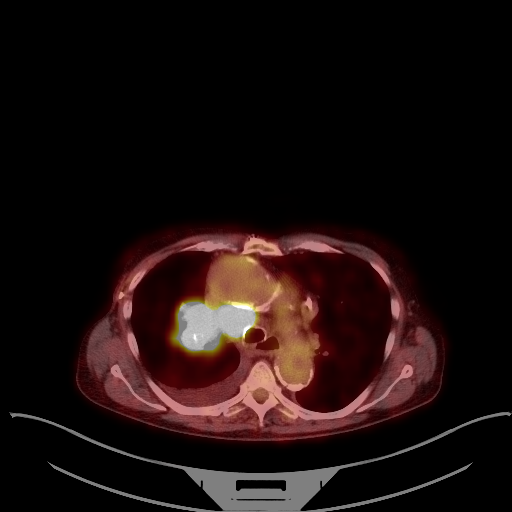
[im 93/186]
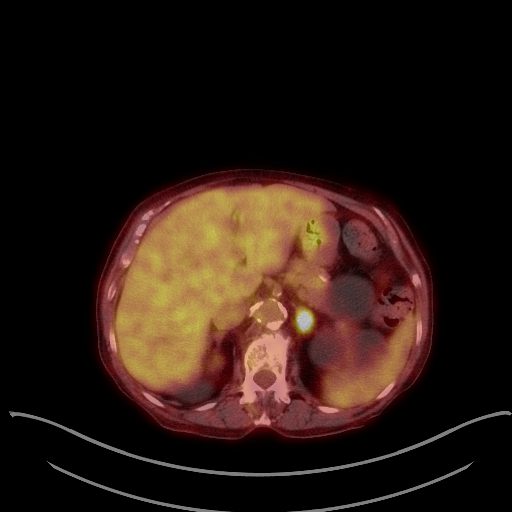
[im 139/186]
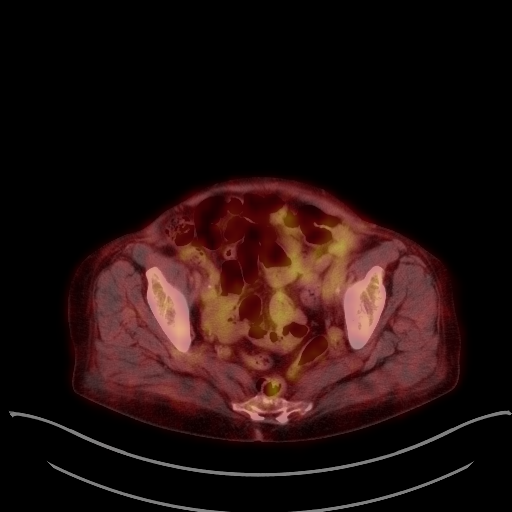

[Series 1032: results mm oncology reading · 0.43mm/px · 1 of 7 slices shown]
[im 1/7]
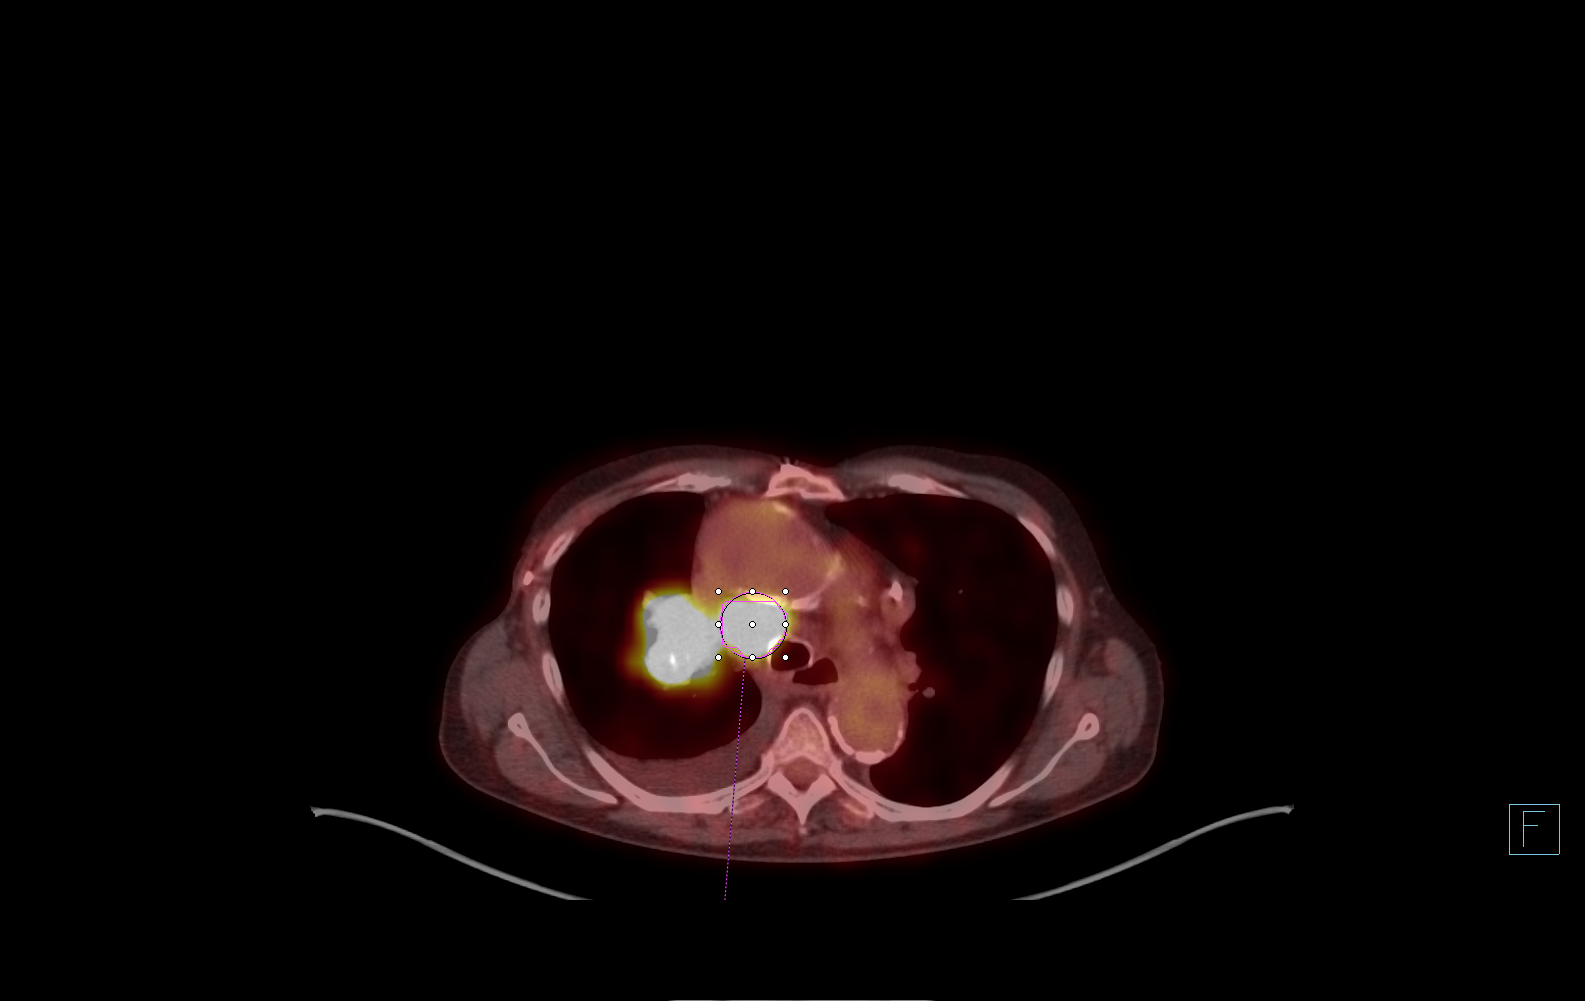

[18 of 25 positions shown; findings below may reference images not displayed]

FINDINGS: NECK

No hypermetabolic lymph nodes in the neck.

CHEST

Hypermetabolic central right upper lobe 5.1 x 3.7 cm lung mass with
max SUV 15.4 (series 8/image 29), which encases and occludes the
associated segmental right upper lobe bronchus, and which is
confluent with hypermetabolic right hilar and right lower
paratracheal adenopathy. Patchy ground-glass opacity in the anterior
right upper lobe is most consistent with postobstructive
pneumonitis. Separate sharply marginated focus of subpleural
reticulation in the anterior right upper lobe is consistent with
mild radiation fibrosis. Mild patchy tree-in-bud opacities in the
anterior left upper lobe with associated low level metabolism (max
SUV 2.7), stable since 01/10/2016, favor mild infectious or
inflammatory bronchiolitis. No additional significant pulmonary
nodules.

Small layering right pleural effusion. No left pleural effusion. No
pleural nodularity or hypermetabolism.

Hypermetabolic right paratracheal adenopathy, for example a 2.7 cm
right lower paratracheal node with max SUV 13.6 (series 4/image 50).
No contralateral mediastinal or contralateral hilar hypermetabolic
adenopathy.

Surgical clips are again noted in the right axilla. No
hypermetabolic axillary lymph nodes.

Duplicated superior vena cava. Left internal jugular MediPort
terminates within the lower third of the left superior vena cava.
Moderate cardiomegaly. Left main, left anterior descending, left
circumflex and right coronary atherosclerosis. Stable
atherosclerotic aneurysmal ascending thoracic aorta, maximum
diameter 4.8 cm. Aberrant nonaneurysmal right subclavian artery
arising from the distal aortic arch with retroesophageal course.
Stable prominently dilated main pulmonary artery (4.7 cm diameter).

ABDOMEN/PELVIS

Left adrenal 1.7 cm hypermetabolic nodule with max SUV 12.0 (series
4/image 95), new since 08/05/2015. No right adrenal hypermetabolism
or nodularity.

Focus of hypermetabolism in the lateral left lower ventral abdominal
muscle wall with associated vague low-attenuation 1.4 cm lesion on
the CT images with max SUV 7.1 (series 4/image 123).

Focal hypermetabolism at the anorectal junction with max SUV 6.6,
without discrete mass or definite wall thickening on the CT images.

No abnormal hypermetabolic activity within the liver, pancreas, or
spleen. No hypermetabolic lymph nodes in the abdomen or pelvis.
Cholecystectomy. Atherosclerotic nonaneurysmal abdominal aorta.
Grossly stable enlarged polycystic kidneys with scattered
subcentimeter hyperdense renal cortical lesions throughout both
kidneys, too small to characterize. Largest simple renal cyst
measures 6.0 cm in the far lower right kidney and 7.4 cm in the
lower left kidney. Hysterectomy.

SKELETON

No focal hypermetabolic activity to suggest skeletal metastasis.
Sternotomy wires appear aligned and intact.
IMPRESSION: 1. Hypermetabolic central right upper lobe 4.1 cm lung mass, which
is confluent with hypermetabolic right hilar and right paratracheal
adenopathy. Hypermetabolic left adrenal nodule. Favor stage IV (T2b
N2 M1b) primary bronchogenic carcinoma.
2. Possible small soft tissue metastasis in the lateral left lower
ventral abdominal muscle wall.
3. Nonspecific focal hypermetabolism at the anorectal junction
without definite CT correlate, favor physiologic uptake, although a
neoplasm cannot be excluded and clinical correlation is necessary.
4. Mild patchy tree-in-bud opacity in the anterior left upper lobe
with associated low level metabolism, favor mild infectious or
inflammatory bronchiolitis.
5. Postobstructive pneumonitis in the right upper lobe.
6. Cardiomegaly. Small layering right pleural effusion. No
appreciable pleural nodularity or hypermetabolism .
7. Duplicated SVC. Aberrant right subclavian artery. Aortic
atherosclerosis. Stable ascending thoracic aortic aneurysm, maximum
diameter 4.8 cm. Stable prominently dilated main pulmonary artery
suggesting chronic pulmonary arterial hypertension.
8. Polycystic kidneys.
